# Patient Record
Sex: Female | Born: 1945 | Race: White | Hispanic: No | Marital: Married | Smoking: Never smoker
Health system: Southern US, Community
[De-identification: ages and names within clinical notes are randomized; demographics above are authoritative.]

## PROBLEM LIST (undated history)

## (undated) DIAGNOSIS — R011 Cardiac murmur, unspecified: Secondary | ICD-10-CM

## (undated) DIAGNOSIS — I4819 Other persistent atrial fibrillation: Secondary | ICD-10-CM

## (undated) DIAGNOSIS — R079 Chest pain, unspecified: Secondary | ICD-10-CM

## (undated) DIAGNOSIS — I10 Essential (primary) hypertension: Secondary | ICD-10-CM

## (undated) DIAGNOSIS — I351 Nonrheumatic aortic (valve) insufficiency: Secondary | ICD-10-CM

## (undated) DIAGNOSIS — R42 Dizziness and giddiness: Secondary | ICD-10-CM

## (undated) DIAGNOSIS — E6609 Other obesity due to excess calories: Secondary | ICD-10-CM

## (undated) DIAGNOSIS — K449 Diaphragmatic hernia without obstruction or gangrene: Secondary | ICD-10-CM

## (undated) DIAGNOSIS — I313 Pericardial effusion (noninflammatory): Secondary | ICD-10-CM

## (undated) DIAGNOSIS — H409 Unspecified glaucoma: Secondary | ICD-10-CM

## (undated) DIAGNOSIS — R002 Palpitations: Secondary | ICD-10-CM

## (undated) DIAGNOSIS — F419 Anxiety disorder, unspecified: Secondary | ICD-10-CM

## (undated) DIAGNOSIS — I480 Paroxysmal atrial fibrillation: Secondary | ICD-10-CM

## (undated) DIAGNOSIS — I052 Rheumatic mitral stenosis with insufficiency: Secondary | ICD-10-CM

## (undated) DIAGNOSIS — R0600 Dyspnea, unspecified: Secondary | ICD-10-CM

## (undated) DIAGNOSIS — G43909 Migraine, unspecified, not intractable, without status migrainosus: Secondary | ICD-10-CM

## (undated) DIAGNOSIS — Z9289 Personal history of other medical treatment: Secondary | ICD-10-CM

## (undated) DIAGNOSIS — I509 Heart failure, unspecified: Secondary | ICD-10-CM

## (undated) DIAGNOSIS — I4892 Unspecified atrial flutter: Secondary | ICD-10-CM

## (undated) DIAGNOSIS — R110 Nausea: Secondary | ICD-10-CM

## (undated) DIAGNOSIS — M1990 Unspecified osteoarthritis, unspecified site: Secondary | ICD-10-CM

## (undated) DIAGNOSIS — Z8679 Personal history of other diseases of the circulatory system: Secondary | ICD-10-CM

## (undated) DIAGNOSIS — Z9989 Dependence on other enabling machines and devices: Secondary | ICD-10-CM

## (undated) DIAGNOSIS — I330 Acute and subacute infective endocarditis: Secondary | ICD-10-CM

## (undated) DIAGNOSIS — G4733 Obstructive sleep apnea (adult) (pediatric): Secondary | ICD-10-CM

## (undated) DIAGNOSIS — R0789 Other chest pain: Secondary | ICD-10-CM

## (undated) DIAGNOSIS — K219 Gastro-esophageal reflux disease without esophagitis: Secondary | ICD-10-CM

## (undated) DIAGNOSIS — I080 Rheumatic disorders of both mitral and aortic valves: Secondary | ICD-10-CM

## (undated) HISTORY — DX: Personal history of other medical treatment: Z92.89

## (undated) HISTORY — DX: Essential (primary) hypertension: I10

## (undated) HISTORY — DX: Migraine, unspecified, not intractable, without status migrainosus: G43.909

## (undated) HISTORY — PX: BUNIONECTOMY: SHX129

## (undated) HISTORY — DX: Personal history of other diseases of the circulatory system: Z86.79

## (undated) HISTORY — DX: Palpitations: R00.2

## (undated) HISTORY — DX: Acute and subacute infective endocarditis: I33.0

## (undated) HISTORY — DX: Nausea: R11.0

## (undated) HISTORY — DX: Pericardial effusion (noninflammatory): I31.3

## (undated) HISTORY — DX: Rheumatic disorders of both mitral and aortic valves: I08.0

## (undated) HISTORY — DX: Rheumatic mitral stenosis with insufficiency: I05.2

## (undated) HISTORY — DX: Dizziness and giddiness: R42

## (undated) HISTORY — DX: Dyspnea, unspecified: R06.00

## (undated) HISTORY — PX: EYE SURGERY: SHX253

## (undated) HISTORY — DX: Cardiac murmur, unspecified: R01.1

## (undated) HISTORY — DX: Nonrheumatic aortic (valve) insufficiency: I35.1

## (undated) HISTORY — DX: Paroxysmal atrial fibrillation: I48.0

## (undated) HISTORY — PX: TONSILLECTOMY AND ADENOIDECTOMY: SUR1326

## (undated) HISTORY — DX: Unspecified atrial flutter: I48.92

## (undated) HISTORY — PX: UVULOPALATOPHARYNGOPLASTY, TONSILLECTOMY AND SEPTOPLASTY: SHX2632

## (undated) HISTORY — DX: Anxiety disorder, unspecified: F41.9

## (undated) HISTORY — DX: Other persistent atrial fibrillation: I48.19

## (undated) HISTORY — DX: Other obesity due to excess calories: E66.09

## (undated) HISTORY — DX: Other chest pain: R07.89

---

## 1973-01-25 HISTORY — PX: ABDOMINAL HYSTERECTOMY: SHX81

## 1975-01-26 HISTORY — PX: APPENDECTOMY: SHX54

## 1995-01-13 HISTORY — PX: CARDIAC CATHETERIZATION: SHX172

## 1995-01-26 HISTORY — PX: LAPAROSCOPIC CHOLECYSTECTOMY: SUR755

## 1999-08-03 ENCOUNTER — Other Ambulatory Visit: Admission: RE | Admit: 1999-08-03 | Discharge: 1999-08-03 | Payer: Self-pay | Admitting: *Deleted

## 2000-03-01 ENCOUNTER — Encounter: Payer: Self-pay | Admitting: Occupational Medicine

## 2000-03-01 ENCOUNTER — Encounter: Admission: RE | Admit: 2000-03-01 | Discharge: 2000-03-01 | Payer: Self-pay | Admitting: Occupational Medicine

## 2001-09-22 ENCOUNTER — Emergency Department (HOSPITAL_COMMUNITY): Admission: EM | Admit: 2001-09-22 | Discharge: 2001-09-23 | Payer: Self-pay | Admitting: Emergency Medicine

## 2001-09-23 ENCOUNTER — Encounter: Payer: Self-pay | Admitting: Emergency Medicine

## 2001-11-24 ENCOUNTER — Ambulatory Visit (HOSPITAL_COMMUNITY): Admission: RE | Admit: 2001-11-24 | Discharge: 2001-11-24 | Payer: Self-pay | Admitting: *Deleted

## 2005-01-25 DIAGNOSIS — Z9289 Personal history of other medical treatment: Secondary | ICD-10-CM

## 2005-01-25 HISTORY — DX: Personal history of other medical treatment: Z92.89

## 2009-05-06 ENCOUNTER — Encounter: Admission: RE | Admit: 2009-05-06 | Discharge: 2009-05-06 | Payer: Self-pay | Admitting: Cardiology

## 2009-09-11 ENCOUNTER — Ambulatory Visit: Payer: Self-pay | Admitting: Cardiology

## 2009-09-11 ENCOUNTER — Ambulatory Visit (HOSPITAL_COMMUNITY): Admission: RE | Admit: 2009-09-11 | Discharge: 2009-09-11 | Payer: Self-pay | Admitting: Cardiology

## 2010-02-03 ENCOUNTER — Ambulatory Visit: Payer: Self-pay | Admitting: Cardiology

## 2010-06-23 ENCOUNTER — Ambulatory Visit (INDEPENDENT_AMBULATORY_CARE_PROVIDER_SITE_OTHER): Payer: 59 | Admitting: Cardiology

## 2010-06-23 ENCOUNTER — Encounter: Payer: Self-pay | Admitting: *Deleted

## 2010-06-23 ENCOUNTER — Encounter: Payer: Self-pay | Admitting: Cardiology

## 2010-06-23 VITALS — BP 120/64 | HR 66 | Ht 60.0 in | Wt 153.5 lb

## 2010-06-23 DIAGNOSIS — I080 Rheumatic disorders of both mitral and aortic valves: Secondary | ICD-10-CM | POA: Insufficient documentation

## 2010-06-23 HISTORY — DX: Rheumatic disorders of both mitral and aortic valves: I08.0

## 2010-06-23 NOTE — Assessment & Plan Note (Signed)
The patient has a history of known mitral stenosis and aortic insufficiency.  She had cardiac catheterization on 01/13/95 which showed normal coronary anatomy and normal left ventricular function and mild mitral stenosis mild pulmonary hypertension and mild aortic insufficiency with a mildly dilated aortic root.  She is maintaining normal sinus rhythm.  Since last visit she has been doing well.  She's not having any unusual dyspnea.  She's been very successful in losing weight through the [employer] program.  Her final goal for weight loss target should be 145 and I gave her a note to that effect

## 2010-06-23 NOTE — Progress Notes (Signed)
Mary Farrell Date of Birth:  [birth year] [facility] / [facility] [address]   [address] [place], [place]  [address] [phone]           Fax   [phone]  HPI: This pleasant 65 year old woman is seen for a four-month followup office visit.  She has a history of mitral stenosis and aortic insufficiency.  She had cardiac catheterization in 1996 which showed normal coronary anatomy and mild mitral stenosis and mild aortic insufficiency she had chest pain in 2007 and underwent a treadmill Cardiolite stress test which showed no ischemia and her ejection fraction was 66%.  Her most recent echocardiogram was await/18/11 and showed mild mitral stenosis with a mitral valve area of 1.9 and mild to moderate aortic insufficiency and normal pulmonary artery pressure and she had a moderate pericardial effusion which was increased since 06/07/08 but no evidence of tamponade.  She had impaired relaxation with normal left ventricular systolic function.  Her last chest x-ray was 05/06/09 showing mild cardiac enlargement and no congestive heart failure.  Since last visit the patient has remained in normal sinus rhythm.  She's not having any orthopnea or paroxysmal nocturnal dyspnea.  She's not having any peripheral edema.  Her weight is down further toward her eventual goal of 145 pounds.  Current Outpatient Prescriptions  Medication Sig Dispense Refill  . ALPRAZolam (XANAX) 0.25 MG tablet Take by mouth. 3/4 of a tablet daily       . aspirin 81 MG tablet Take 81 mg by mouth daily.        . B Complex Vitamins (VITAMIN B COMPLEX PO) Take 1 capsule by mouth daily.        . Calcium Carbonate (CALCIUM 500 PO) Take 500 mg by mouth daily.        . Cholecalciferol (VITAMIN D) 2000 UNITS CAPS Take 2,000 Units by mouth daily.        . fexofenadine (ALLEGRA) 180 MG tablet Take 180 mg by mouth daily.        . hydrochlorothiazide 25 MG tablet Take 25 mg by mouth daily.        Marland Kitchen ibuprofen (ADVIL,MOTRIN) 200  MG tablet Take 600 mg by mouth as needed.        Marland Kitchen losartan (COZAAR) 100 MG tablet Take 100 mg by mouth daily.        . multivitamin (THERAGRAN) per tablet Take 1 tablet by mouth daily.        . nadolol (CORGARD) 20 MG tablet Take 20 mg by mouth daily.        Marland Kitchen omeprazole (PRILOSEC) 40 MG capsule Take 40 mg by mouth 2 (two) times daily.          No Known Allergies  Patient Active Problem List  Diagnoses  . Mitral stenosis and aortic insufficiency    History  Smoking status  . Never Smoker   Smokeless tobacco  . Never Used    History  Alcohol Use No    No family history on file.  Review of Systems: The patient denies any heat or cold intolerance.  No weight gain or weight loss.  The patient denies headaches or blurry vision.  There is no cough or sputum production.  The patient denies dizziness.  There is no hematuria or hematochezia.  The patient denies any muscle aches or arthritis.  The patient denies any rash.  The patient denies frequent falling or instability.  There is no history of depression or  anxiety.  All other systems were reviewed and are negative.   Physical Exam: Filed Vitals:   06/23/10 1419  BP: 120/64  Pulse: 66  General appearance reveals a well-developed well-nourished woman in no distress.Pupils equal and reactive.   Extraocular Movements are full.  There is no scleral icterus.  The mouth and pharynx are normal.  The neck is supple.  The carotids reveal no bruits.  The jugular venous pressure is normal.  The thyroid is not enlarged.  There is no lymphadenopathy.The chest is clear to percussion and auscultation. There are no rales or rhonchi. Expansion of the chest is symmetrical.  The heart reveals a loud first heart sound and a loud opening snap followed by a soft mitral Rumble.The abdomen is soft and nontender. Bowel sounds are normal. The liver and spleen are not enlarged. There Are no abdominal masses. There are no bruits.The pedal pulses are good.  There  is no phlebitis or edema.  There is no cyanosis or clubbing.Strength is normal and symmetrical in all extremities.  There is no lateralizing weakness.  There are no sensory deficits.    Assessment / Plan: Continue same meds and return in 4 months for followup office visit and EKG.

## 2010-08-12 ENCOUNTER — Other Ambulatory Visit: Payer: Self-pay | Admitting: Cardiology

## 2010-08-12 DIAGNOSIS — I119 Hypertensive heart disease without heart failure: Secondary | ICD-10-CM

## 2010-08-12 NOTE — Telephone Encounter (Signed)
escribe request  

## 2010-10-02 ENCOUNTER — Other Ambulatory Visit: Payer: Self-pay | Admitting: *Deleted

## 2010-10-02 DIAGNOSIS — F419 Anxiety disorder, unspecified: Secondary | ICD-10-CM

## 2010-10-02 NOTE — Telephone Encounter (Signed)
Refilled meds per fax request.  

## 2010-10-03 MED ORDER — ALPRAZOLAM 0.25 MG PO TABS: ORAL_TABLET | ORAL | Status: DC

## 2010-10-13 ENCOUNTER — Ambulatory Visit: Payer: 59 | Admitting: Cardiology

## 2010-10-17 ENCOUNTER — Encounter: Payer: Self-pay | Admitting: Cardiology

## 2010-10-30 ENCOUNTER — Ambulatory Visit (INDEPENDENT_AMBULATORY_CARE_PROVIDER_SITE_OTHER): Payer: 59 | Admitting: Cardiology

## 2010-10-30 ENCOUNTER — Encounter: Payer: Self-pay | Admitting: Cardiology

## 2010-10-30 VITALS — BP 130/80 | HR 66 | Ht 60.0 in | Wt 150.0 lb

## 2010-10-30 DIAGNOSIS — I050 Rheumatic mitral stenosis: Secondary | ICD-10-CM

## 2010-10-30 DIAGNOSIS — I080 Rheumatic disorders of both mitral and aortic valves: Secondary | ICD-10-CM

## 2010-10-30 DIAGNOSIS — R0600 Dyspnea, unspecified: Secondary | ICD-10-CM

## 2010-10-30 DIAGNOSIS — I119 Hypertensive heart disease without heart failure: Secondary | ICD-10-CM

## 2010-10-30 HISTORY — DX: Dyspnea, unspecified: R06.00

## 2010-10-30 MED ORDER — HYDROCHLOROTHIAZIDE 25 MG PO TABS: 25.0000 mg | ORAL_TABLET | Freq: Every day | ORAL | Status: DC

## 2010-10-30 NOTE — Assessment & Plan Note (Signed)
Since last visit, the patient has had no new cardiac symptoms.  She still has some exertional dyspnea.  She does not get a lot of regular aerobic exercise.  She has a history of mild exogenous obesity and has been able to lose weight on a Weight Watchers diet.  Her revised.  Weight goal is 148 pounds.  She has not been experiencing any orthopnea or paroxysmal nocturnal dyspnea.  She does not have any pedal edema.  She remains in normal sinus rhythm.

## 2010-10-30 NOTE — Assessment & Plan Note (Signed)
She has a mild chronic exertional dyspnea.  This should improve since a lot of it is related to deconditioning.  She is not having symptoms of congestive heart failure.  Does have known diastolic dysfunction.  Her blood pressure is normal

## 2010-10-30 NOTE — Progress Notes (Signed)
Mary Farrell Date of Birth:  Jul 29, 1945 [facility] / [facility] [address]   [address] [place], [place]  [address] [phone]           Fax   [phone]  HPI: This pleasant 65 year old woman is seen for a scheduled four-month followup office visit.  She has a history of mild mitral stenosis and mild aortic insufficiency.  She has remained in normal sinus rhythm.  She had cardiac catheterization in 1996, which showed normal coronary arteries, and mild mitral stenosis, and mild aortic insufficiency.  In 2007.  She had chest pain and had a normal Cardiolite stress test showing no ischemia.  Her last echocardiogram in August 2011.  Mild mitral stenosis with a mitral valve area of 1.9, and mild to moderate aortic insufficiency, and normal pulmonary pressure.  Also, had a moderate pericardial effusion.  She has normal systolic function with impaired relaxation.  Her last chest x-ray on 05/06/09 showed mild cardiac enlargement, but no congestive heart failure.  Current Outpatient Prescriptions  Medication Sig Dispense Refill  . ALPRAZolam (XANAX) 0.25 MG tablet 1/2 at hs       . aspirin 81 MG tablet Take 81 mg by mouth daily.        . B Complex Vitamins (VITAMIN B COMPLEX PO) Take 1 capsule by mouth daily.        . Calcium Carbonate (CALCIUM 500 PO) Take 500 mg by mouth daily.        . Cholecalciferol (VITAMIN D) 2000 UNITS CAPS Take 2,000 Units by mouth daily.        . fexofenadine (ALLEGRA) 180 MG tablet Take 180 mg by mouth daily.        . hydrochlorothiazide (HYDRODIURIL) 25 MG tablet Take 1 tablet (25 mg total) by mouth daily.  90 tablet  3  . ibuprofen (ADVIL,MOTRIN) 200 MG tablet Take 600 mg by mouth as needed.        Marland Kitchen losartan (COZAAR) 100 MG tablet TAKE 1 TABLET DAILY  90 tablet  3  . multivitamin (THERAGRAN) per tablet Take 1 tablet by mouth daily.        . nadolol (CORGARD) 20 MG tablet Take 20 mg by mouth daily.        Marland Kitchen omeprazole (PRILOSEC) 40 MG capsule Take 40  mg by mouth daily.         No Known Allergies  Patient Active Problem List  Diagnoses  . Mitral stenosis and aortic insufficiency    History  Smoking status  . Never Smoker   Smokeless tobacco  . Never Used    History  Alcohol Use No    No family history on file.  Review of Systems: The patient denies any heat or cold intolerance.  No weight gain or weight loss.  The patient denies headaches or blurry vision.  There is no cough or sputum production.  The patient denies dizziness.  There is no hematuria or hematochezia.  The patient denies any muscle aches or arthritis.  The patient denies any rash.  The patient denies frequent falling or instability.  There is no history of depression or anxiety.  All other systems were reviewed and are negative.   Physical Exam: Filed Vitals:   10/30/10 1524  BP: 130/80  Pulse: 66   general appearance reveals a well-developed, well-nourished, Caucasian female, in no distress.Pupils equal and reactive.   Extraocular Movements are full.  There is no scleral icterus.  The mouth and pharynx  are normal.  The neck is supple.  The carotids reveal no bruits.  The jugular venous pressure is normal.  The thyroid is not enlarged.  There is no lymphadenopathy.  The chest is clear to percussion and auscultation. There are no rales or rhonchi. Expansion of the chest is symmetrical.  The heart reveals a loud first heart sound, followed by a normal second heart sound, followed by opening click followed by a low diastolic rumble at the apex.  It is also a soft murmur of aortic insufficiency.  At the left sternal edge.The abdomen is soft and nontender. Bowel sounds are normal. The liver and spleen are not enlarged. There Are no abdominal masses. There are no bruits.  The pedal pulses are good.  There is no phlebitis or edema.  There is no cyanosis or clubbing. Strength is normal and symmetrical in all extremities.  There is no lateralizing weakness.  There are  no sensory deficits.  The skin is warm and dry.  There is no rash.  The EKG shows normal sinus rhythm with slight increase in anterior wall T-wave abnormality.  Since January 2012.     Assessment / Plan:  Continue same medication.  Recheck in 4 months.  Continue on Weight Watchers diet.  We'll recheck her EKG next visit.  After that consider updating her echo

## 2010-10-30 NOTE — Patient Instructions (Signed)
continue same dose of medications   Continue careful diet

## 2010-11-13 ENCOUNTER — Other Ambulatory Visit: Payer: Self-pay | Admitting: Cardiology

## 2011-01-26 HISTORY — PX: CATARACT EXTRACTION W/ INTRAOCULAR LENS  IMPLANT, BILATERAL: SHX1307

## 2011-01-26 HISTORY — PX: COLONOSCOPY: SHX174

## 2011-03-11 ENCOUNTER — Encounter: Payer: Self-pay | Admitting: Cardiology

## 2011-03-11 ENCOUNTER — Ambulatory Visit (INDEPENDENT_AMBULATORY_CARE_PROVIDER_SITE_OTHER): Payer: 59 | Admitting: Cardiology

## 2011-03-11 VITALS — BP 110/58 | HR 70 | Ht 60.0 in | Wt 152.0 lb

## 2011-03-11 DIAGNOSIS — R0600 Dyspnea, unspecified: Secondary | ICD-10-CM

## 2011-03-11 DIAGNOSIS — R0609 Other forms of dyspnea: Secondary | ICD-10-CM

## 2011-03-11 DIAGNOSIS — I080 Rheumatic disorders of both mitral and aortic valves: Secondary | ICD-10-CM

## 2011-03-11 DIAGNOSIS — I259 Chronic ischemic heart disease, unspecified: Secondary | ICD-10-CM

## 2011-03-11 DIAGNOSIS — K219 Gastro-esophageal reflux disease without esophagitis: Secondary | ICD-10-CM

## 2011-03-11 DIAGNOSIS — I119 Hypertensive heart disease without heart failure: Secondary | ICD-10-CM

## 2011-03-11 NOTE — Progress Notes (Signed)
Mary Farrell Date of Birth:  Oct 30, 1945 [facility] [address] [place], [place]  [address] [phone]  Fax   [phone]  HPI: This pleasant 66 year old woman is seen for a scheduled four-month followup office visit.  She has a past history of known valvular heart disease with mitral stenosis and aortic insufficiency.  She does not have coronary disease.  She had cardiac catheterization in 1996 she had a normal Cardiolite stress test in 2007.  She remains in normal sinus rhythm.  Her last echocardiogram in August 2011 showed mild mitral stenosis with a mitral valve area of 1.9 and mild to moderate aortic insufficiency and normal pulmonary artery pressure.  Her last chest x-ray on 05/06/09 showed mild cardiomegaly but no congestive heart failure.  Current Outpatient Prescriptions  Medication Sig Dispense Refill  . ALPRAZolam (XANAX) 0.25 MG tablet 3/4 at hs      . aspirin 81 MG tablet Take 81 mg by mouth daily.        . B Complex Vitamins (VITAMIN B COMPLEX PO) Take 1 capsule by mouth daily.        . Calcium Carbonate (CALCIUM 500 PO) Take 500 mg by mouth daily.        . Cholecalciferol (VITAMIN D) 2000 UNITS CAPS Take 2,000 Units by mouth daily.        . fexofenadine (ALLEGRA) 180 MG tablet Take 180 mg by mouth daily.        . hydrochlorothiazide (HYDRODIURIL) 25 MG tablet Take 1 tablet (25 mg total) by mouth daily.  90 tablet  3  . ibuprofen (ADVIL,MOTRIN) 200 MG tablet Take 600 mg by mouth as needed.        Marland Kitchen losartan (COZAAR) 100 MG tablet TAKE 1 TABLET DAILY  90 tablet  3  . multivitamin (THERAGRAN) per tablet Take 1 tablet by mouth daily.        . nadolol (CORGARD) 20 MG tablet TAKE 1 TABLET DAILY  90 tablet  2  . omeprazole (PRILOSEC) 40 MG capsule Take 40 mg by mouth daily.       . psyllium (METAMUCIL) 58.6 % powder Take 1 packet by mouth daily as needed.        No Known Allergies  Patient Active Problem List  Diagnoses  . Mitral stenosis and aortic  insufficiency  . Dyspnea    History  Smoking status  . Never Smoker   Smokeless tobacco  . Never Used    History  Alcohol Use No    No family history on file.  Review of Systems: The patient denies any heat or cold intolerance.  No weight gain or weight loss.  The patient denies headaches or blurry vision.  There is no cough or sputum production.  The patient denies dizziness.  There is no hematuria or hematochezia.  The patient denies any muscle aches or arthritis.  The patient denies any rash.  The patient denies frequent falling or instability.  There is no history of depression or anxiety.  All other systems were reviewed and are negative.   Physical Exam: Filed Vitals:   03/11/11 1132  BP: 110/58  Pulse: 70   the general appearance reveals a well-developed well-nourished woman in no distress.Pupils equal and reactive.   Extraocular Movements are full.  There is no scleral icterus.  The mouth and pharynx are normal.  The neck is supple.  The carotids reveal no bruits.  The jugular venous pressure is normal.  The thyroid is  not enlarged.  There is no lymphadenopathy.  The chest is clear to percussion and auscultation. There are no rales or rhonchi. Expansion of the chest is symmetrical.  The heart reveals a prominent opening snap and a soft diastolic apical rumble.The abdomen is soft and nontender. Bowel sounds are normal. The liver and spleen are not enlarged. There Are no abdominal masses. There are no bruits.  The pedal pulses are good.  There is no phlebitis or edema.  There is no cyanosis or clubbing. Strength is normal and symmetrical in all extremities.  There is no lateralizing weakness.  There are no sensory deficits.  The skin is warm and dry.  There is no rash.  EKG today shows normal sinus rhythm and is within normal limits and is unchanged from prior tracings.    Assessment / Plan:   continue same medication.  Work harder on weight loss.  Recheck in 4 months  for followup office visit

## 2011-03-11 NOTE — Assessment & Plan Note (Signed)
The patient has a history of GERD.  Recently she's been having more heartburn symptoms.  She has been supplementing her daily omeprazole with TUMS.  She uses an occasional Zantac as well.  She's had some recent constipation and she'll start on Metamucil or Citrucel.

## 2011-03-11 NOTE — Assessment & Plan Note (Signed)
The patient is having mild exertional dyspnea which is unchanged.  She's not having any dyspnea at rest.

## 2011-03-11 NOTE — Patient Instructions (Signed)
Start Metamucil once daily as needed Your physician recommends that you continue on your current medications as directed. Please refer to the Current Medication list given to you today. Your physician recommends that you schedule a follow-up appointment in: 4 month

## 2011-03-11 NOTE — Assessment & Plan Note (Signed)
The patient has not been having new symptoms from her valvular heart disease.  She has not been having any paroxysmal nocturnal dyspnea.  She has not been having any peripheral edema.  She has remained in normal sinus rhythm.

## 2011-03-14 ENCOUNTER — Ambulatory Visit: Payer: 59

## 2011-03-14 ENCOUNTER — Ambulatory Visit (INDEPENDENT_AMBULATORY_CARE_PROVIDER_SITE_OTHER): Payer: 59 | Admitting: Internal Medicine

## 2011-03-14 ENCOUNTER — Encounter: Payer: Self-pay | Admitting: Internal Medicine

## 2011-03-14 DIAGNOSIS — M4120 Other idiopathic scoliosis, site unspecified: Secondary | ICD-10-CM

## 2011-03-14 DIAGNOSIS — S335XXA Sprain of ligaments of lumbar spine, initial encounter: Secondary | ICD-10-CM

## 2011-03-14 DIAGNOSIS — M545 Low back pain: Secondary | ICD-10-CM

## 2011-03-14 MED ORDER — HYDROCODONE-ACETAMINOPHEN 5-500 MG PO TABS: 1.0000 | ORAL_TABLET | Freq: Three times a day (TID) | ORAL | Status: AC | PRN

## 2011-03-14 MED ORDER — METHYLPREDNISOLONE ACETATE 80 MG/ML IJ SUSP
80.0000 mg | Freq: Once | INTRAMUSCULAR | Status: AC
  Administered 2011-03-14: 80 mg via INTRAMUSCULAR

## 2011-03-14 NOTE — Progress Notes (Signed)
  Subjective:    Patient ID: Mary Farrell, female    DOB: 01/01/1946, 66 y.o.   MRN: [id number]  HPI  AT HOME AND TWISTED, FELT IMMEDIATE right low back pain. No radiation, weakness, numbness. No hx of back surgery No incontinence  Review of Systems See problem list    Objective:   Physical Exam  Constitutional: She is oriented to person, place, and time. She appears well-developed and well-nourished. She appears distressed.  Musculoskeletal:       Left hip: She exhibits decreased range of motion and tenderness. She exhibits normal strength, no swelling and no deformity.       Any movement cause right LBP and spasm  Neurological: She is alert and oriented to person, place, and time. She has normal strength and normal reflexes. No sensory deficit. Coordination abnormal.  Skin: Skin is warm, dry and intact.      [facility] reading (PRIMARY) by  Dr. 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

## 2011-03-14 NOTE — Progress Notes (Signed)
Addended by: Jonita Albee on: 03/14/2011 11:49 AM   Modules accepted: Orders

## 2011-03-25 ENCOUNTER — Other Ambulatory Visit: Payer: Self-pay | Admitting: *Deleted

## 2011-03-25 DIAGNOSIS — F419 Anxiety disorder, unspecified: Secondary | ICD-10-CM

## 2011-03-25 NOTE — Telephone Encounter (Signed)
Refill on alprazolam 

## 2011-03-28 MED ORDER — ALPRAZOLAM 0.25 MG PO TABS: 0.2500 mg | ORAL_TABLET | Freq: Every evening | ORAL | Status: DC | PRN

## 2011-07-01 ENCOUNTER — Encounter: Payer: Self-pay | Admitting: Cardiology

## 2011-07-01 ENCOUNTER — Ambulatory Visit (INDEPENDENT_AMBULATORY_CARE_PROVIDER_SITE_OTHER): Payer: 59 | Admitting: Cardiology

## 2011-07-01 VITALS — BP 128/80 | HR 66 | Ht 61.0 in | Wt 154.0 lb

## 2011-07-01 DIAGNOSIS — K219 Gastro-esophageal reflux disease without esophagitis: Secondary | ICD-10-CM

## 2011-07-01 DIAGNOSIS — I080 Rheumatic disorders of both mitral and aortic valves: Secondary | ICD-10-CM

## 2011-07-01 NOTE — Assessment & Plan Note (Signed)
She has not been having any palpitations.  She has not been having any chest pain.  He notes occasional exertional dyspnea.  She is relatively sedentary but has used her home treadmill occasionally

## 2011-07-01 NOTE — Patient Instructions (Signed)
Your physician recommends that you continue on your current medications as directed. Please refer to the Current Medication list given to you today. Your physician wants you to follow-up in: 4 months You will receive a reminder letter in the mail two months in advance. If you don't receive a letter, please call our office to schedule the follow-up appointment.  

## 2011-07-01 NOTE — Assessment & Plan Note (Signed)
The patient has not been having any symptoms from her GERD recently.  She has gained 2 pounds since last visit and is now going back to Weight Watchers.

## 2011-07-01 NOTE — Progress Notes (Signed)
Mary Farrell Date of Birth:  Feb 28, 1945 [facility] [address] [place], [place]  [address] [phone]  Fax   [phone]  HPI: This pleasant 66 year old woman is seen for a scheduled four-month followup office visit.  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  She does not have any history of ischemic heart disease.  She had a cardiac catheterization in 1996 and she had a normal Cardiolite stress test in 2007 her last echocardiogram in August 2011 showed mild mitral stenosis and mild to moderate aortic insufficiency and normal pulmonary artery pressure.  She is maintaining normal sinus rhythm.  She has cardiomegaly by chest x-ray but no symptoms of CHF.  Current Outpatient Prescriptions  Medication Sig Dispense Refill  . ALPRAZolam (XANAX) 0.25 MG tablet Take 1 tablet (0.25 mg total) by mouth at bedtime as needed for sleep. 3/4 at hs  30 tablet  5  . aspirin 81 MG tablet Take 81 mg by mouth daily.        . B Complex Vitamins (VITAMIN B COMPLEX PO) Take 1 capsule by mouth daily.        . Calcium Carbonate (CALCIUM 500 PO) Take 500 mg by mouth daily.        . Cholecalciferol (VITAMIN D) 2000 UNITS CAPS Take 2,000 Units by mouth daily.        . fexofenadine (ALLEGRA) 180 MG tablet Take 180 mg by mouth daily.        . hydrochlorothiazide (HYDRODIURIL) 25 MG tablet Take 1 tablet (25 mg total) by mouth daily.  90 tablet  3  . ibuprofen (ADVIL,MOTRIN) 200 MG tablet Take 600 mg by mouth as needed.        Marland Kitchen losartan (COZAAR) 100 MG tablet TAKE 1 TABLET DAILY  90 tablet  3  . multivitamin (THERAGRAN) per tablet Take 1 tablet by mouth daily.        . nadolol (CORGARD) 20 MG tablet TAKE 1 TABLET DAILY  90 tablet  2  . omeprazole (PRILOSEC) 40 MG capsule Take 40 mg by mouth daily.       . psyllium (METAMUCIL) 58.6 % powder Take 1 packet by mouth daily as needed.      Marland Kitchen CLIMARA 0.075 MG/24HR as directed.        No Known  Allergies  Patient Active Problem List  Diagnoses  . Mitral stenosis and aortic insufficiency  . Dyspnea  . GERD (gastroesophageal reflux disease)    History  Smoking status  . Never Smoker   Smokeless tobacco  . Never Used    History  Alcohol Use No    No family history on file.  Review of Systems: The patient denies any heat or cold intolerance.  No weight gain or weight loss.  The patient denies headaches or blurry vision.  There is no cough or sputum production.  The patient denies dizziness.  There is no hematuria or hematochezia.  The patient denies any muscle aches or arthritis.  The patient denies any rash.  The patient denies frequent falling or instability.  There is no history of depression or anxiety.  All other systems were reviewed and are negative.   Physical Exam: Filed Vitals:   07/01/11 1140  BP: 128/80  Pulse: 66   the general appearance reveals a well-developed well-nourished woman in no distress.The head and neck exam reveals pupils equal and reactive.  Extraocular movements are full.  There is no scleral  icterus.  The mouth and pharynx are normal.  The neck is supple.  The carotids reveal no bruits.  The jugular venous pressure is normal.  The  thyroid is not enlarged.  There is no lymphadenopathy.  The chest is clear to percussion and auscultation.  There are no rales or rhonchi.  Expansion of the chest is symmetrical.  The precordium is quiet.  The first heart sound is normal.  The second heart sound is physiologically split.  There is a prominent first heart sound and there is any opening snap following the second heart sound.  There is a soft apical diastolic rumble.  Rhythm is regular.  There is no abnormal lift or heave.  The abdomen is soft and nontender.  The bowel sounds are normal.  The liver and spleen are not enlarged.  There are no abdominal masses.  There are no abdominal bruits.  Extremities reveal good pedal pulses.  There is no phlebitis or edema.   There is no cyanosis or clubbing.  Strength is normal and symmetrical in all extremities.  There is no lateralizing weakness.  There are no sensory deficits.  The skin is warm and dry.  There is no rash.      Assessment / Plan: Continue same medication.  Recheck in 4 months.  Work harder at weight loss.

## 2011-07-10 ENCOUNTER — Other Ambulatory Visit: Payer: Self-pay | Admitting: Cardiology

## 2011-07-12 NOTE — Telephone Encounter (Signed)
Refilled losartan 

## 2011-07-18 ENCOUNTER — Other Ambulatory Visit: Payer: Self-pay | Admitting: Cardiology

## 2011-07-19 NOTE — Telephone Encounter (Signed)
Refilled nadolol

## 2011-09-13 ENCOUNTER — Other Ambulatory Visit: Payer: Self-pay | Admitting: *Deleted

## 2011-09-13 DIAGNOSIS — F419 Anxiety disorder, unspecified: Secondary | ICD-10-CM

## 2011-09-13 NOTE — Telephone Encounter (Signed)
Refilled alprazolam 

## 2011-09-14 MED ORDER — ALPRAZOLAM 0.25 MG PO TABS: ORAL_TABLET | ORAL | Status: DC

## 2011-10-21 ENCOUNTER — Encounter: Payer: Self-pay | Admitting: Cardiology

## 2011-11-08 ENCOUNTER — Ambulatory Visit (INDEPENDENT_AMBULATORY_CARE_PROVIDER_SITE_OTHER): Payer: 59 | Admitting: Cardiology

## 2011-11-08 ENCOUNTER — Encounter: Payer: Self-pay | Admitting: Cardiology

## 2011-11-08 VITALS — BP 126/68 | HR 67 | Ht 60.0 in | Wt 153.8 lb

## 2011-11-08 DIAGNOSIS — R0600 Dyspnea, unspecified: Secondary | ICD-10-CM

## 2011-11-08 DIAGNOSIS — K219 Gastro-esophageal reflux disease without esophagitis: Secondary | ICD-10-CM

## 2011-11-08 DIAGNOSIS — I080 Rheumatic disorders of both mitral and aortic valves: Secondary | ICD-10-CM

## 2011-11-08 NOTE — Progress Notes (Signed)
Mary Farrell Date of Birth:  1945-10-08 [facility] [address] [place], [place]  [address] [phone]         Fax   [phone]  History of Present Illness: This pleasant 66 year old woman is seen for a scheduled four-month followup office visit. 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 She does not have any history of ischemic heart disease. She had a cardiac catheterization in 1996 and she had a normal Cardiolite stress test in 2007 her last echocardiogram in August 2011 showed mild mitral stenosis and mild to moderate aortic insufficiency and normal pulmonary artery pressure. She is maintaining normal sinus rhythm. She has cardiomegaly by chest x-ray but no symptoms of CHF Since last visit she's had no new cardiac symptoms.  She now has glaucoma and is on timolol eyedrops which has not had any adverse effect on her cardiac function.  She also has had successful bilateral cataract surgery by Dr. Dione Booze since we last saw her.  Current Outpatient Prescriptions  Medication Sig Dispense Refill  . ALPRAZolam (XANAX) 0.25 MG tablet Take one at bedtime  30 tablet  5  . aspirin 81 MG tablet Take 81 mg by mouth daily.        . B Complex Vitamins (VITAMIN B COMPLEX PO) Take 1 capsule by mouth daily.        . Calcium Carbonate (CALCIUM 500 PO) Take 500 mg by mouth daily.        . Cholecalciferol (VITAMIN D) 2000 UNITS CAPS Take 2,000 Units by mouth daily.        Marland Kitchen CLIMARA 0.075 MG/24HR as directed.      . dorzolamide-timolol (COSOPT) 22.3-6.8 MG/ML ophthalmic solution 1 drop.       . fexofenadine (ALLEGRA) 180 MG tablet Take 180 mg by mouth daily.        . hydrochlorothiazide (HYDRODIURIL) 25 MG tablet Take 1 tablet (25 mg total) by mouth daily.  90 tablet  3  . ibuprofen (ADVIL,MOTRIN) 200 MG tablet Take 600 mg by mouth as needed.        Marland Kitchen losartan (COZAAR) 100 MG tablet TAKE 1 TABLET DAILY  90 tablet  3  . multivitamin  (THERAGRAN) per tablet Take 1 tablet by mouth daily.        . nadolol (CORGARD) 20 MG tablet TAKE 1 TABLET DAILY  90 tablet  3  . omeprazole (PRILOSEC) 40 MG capsule Take 40 mg by mouth daily.       . psyllium (METAMUCIL) 58.6 % powder Take 1 packet by mouth daily as needed.        No Known Allergies  Patient Active Problem List  Diagnosis  . Mitral stenosis and aortic insufficiency  . Dyspnea  . GERD (gastroesophageal reflux disease)    History  Smoking status  . Never Smoker   Smokeless tobacco  . Never Used    History  Alcohol Use No    History reviewed. No pertinent family history.  Review of Systems: Constitutional: no fever chills diaphoresis or fatigue or change in weight.  Head and neck: no hearing loss, no epistaxis, no photophobia or visual disturbance. Respiratory: No cough, shortness of breath or wheezing. Cardiovascular: No chest pain peripheral edema, palpitations. Gastrointestinal: No abdominal distention, no abdominal pain, no change in bowel habits hematochezia or melena. Genitourinary: No dysuria, no frequency, no urgency, no nocturia. Musculoskeletal:No arthralgias, no back pain, no gait disturbance or myalgias. Neurological: No dizziness,  no headaches, no numbness, no seizures, no syncope, no weakness, no tremors. Hematologic: No lymphadenopathy, no easy bruising. Psychiatric: No confusion, no hallucinations, no sleep disturbance.    Physical Exam: Filed Vitals:   11/08/11 1532  BP: 126/68  Pulse: 67   the general appearance reveals a well-developed well-nourished woman in no distress.The head and neck exam reveals pupils equal and reactive.  Extraocular movements are full.  There is no scleral icterus.  The mouth and pharynx are normal.  The neck is supple.  The carotids reveal no bruits.  The jugular venous pressure is normal.  The  thyroid is not enlarged.  There is no lymphadenopathy.  The chest is clear to percussion and auscultation.  There are  no rales or rhonchi.  Expansion of the chest is symmetrical.  The precordium is quiet.  The first heart sound is normal.  The second heart sound is physiologically split.  There is a loud first heart sound and there is a prominent opening snap followed by an early diastolic murmur of mitral stenosis.  There is no abnormal lift or heave.  The abdomen is soft and nontender.  The bowel sounds are normal.  The liver and spleen are not enlarged.  There are no abdominal masses.  There are no abdominal bruits.  Extremities reveal good pedal pulses.  There is no phlebitis or edema.  There is no cyanosis or clubbing.  Strength is normal and symmetrical in all extremities.  There is no lateralizing weakness.  There are no sensory deficits.  The skin is warm and dry.  There is no rash.     Assessment / Plan: Continue same medication but I want her to lose weight and to join an exercise program.  She needs to be more physically active.  Recheck in 4 months for followup office visit and EKG.

## 2011-11-08 NOTE — Assessment & Plan Note (Signed)
The patient has had no change in her exertional dyspnea.  She is sedentary and does not get any regular exercise.

## 2011-11-08 NOTE — Patient Instructions (Addendum)
Work harder on weight loss and increase your exercise  Your physician recommends that you continue on your current medications as directed. Please refer to the Current Medication list given to you today.  Your physician recommends that you schedule a follow-up appointment in: 4 month ov/ekg

## 2011-11-08 NOTE — Assessment & Plan Note (Signed)
The patient remains in normal sinus rhythm.  No TIA or stroke symptoms.  No angina or symptoms of CHF

## 2011-11-08 NOTE — Assessment & Plan Note (Signed)
Her symptoms of GERD remain mild and are responding to omeprazole

## 2012-02-21 ENCOUNTER — Other Ambulatory Visit: Payer: Self-pay

## 2012-02-21 DIAGNOSIS — I119 Hypertensive heart disease without heart failure: Secondary | ICD-10-CM

## 2012-02-21 MED ORDER — HYDROCHLOROTHIAZIDE 25 MG PO TABS: 25.0000 mg | ORAL_TABLET | Freq: Every day | ORAL | Status: DC

## 2012-02-24 ENCOUNTER — Other Ambulatory Visit: Payer: Self-pay

## 2012-02-24 DIAGNOSIS — I119 Hypertensive heart disease without heart failure: Secondary | ICD-10-CM

## 2012-02-24 MED ORDER — HYDROCHLOROTHIAZIDE 25 MG PO TABS: 25.0000 mg | ORAL_TABLET | Freq: Every day | ORAL | Status: DC

## 2012-03-14 ENCOUNTER — Other Ambulatory Visit: Payer: Self-pay | Admitting: *Deleted

## 2012-03-14 DIAGNOSIS — F419 Anxiety disorder, unspecified: Secondary | ICD-10-CM

## 2012-03-14 MED ORDER — ALPRAZOLAM 0.25 MG PO TABS: ORAL_TABLET | ORAL | Status: DC

## 2012-03-14 NOTE — Telephone Encounter (Signed)
Called in Rx

## 2012-03-14 NOTE — Telephone Encounter (Signed)
Pharmacy is requesting Xanax . Will route this to his nurse

## 2012-03-15 ENCOUNTER — Ambulatory Visit: Payer: 59 | Admitting: Cardiology

## 2012-03-23 ENCOUNTER — Encounter: Payer: Self-pay | Admitting: Cardiology

## 2012-03-23 ENCOUNTER — Ambulatory Visit (INDEPENDENT_AMBULATORY_CARE_PROVIDER_SITE_OTHER): Payer: 59 | Admitting: Cardiology

## 2012-03-23 VITALS — BP 118/72 | HR 56 | Ht 60.0 in | Wt 161.0 lb

## 2012-03-23 DIAGNOSIS — R0600 Dyspnea, unspecified: Secondary | ICD-10-CM

## 2012-03-23 MED ORDER — HYDROCHLOROTHIAZIDE 25 MG PO TABS: 25.0000 mg | ORAL_TABLET | Freq: Every day | ORAL | Status: DC

## 2012-03-23 NOTE — Assessment & Plan Note (Signed)
The patient has dyspnea if she walks far or if she tries to climb stairs.  She has gained weight since last visit.  She is not getting any regular exercise.  She does have a treadmill available for home use but has not been using it.

## 2012-03-23 NOTE — Assessment & Plan Note (Signed)
Since last visit she's had no new cardiac symptoms.  She denies any orthopnea or paroxysmal nocturnal dyspnea.  She has not had any recurrence of atrial fibrillation.

## 2012-03-23 NOTE — Progress Notes (Signed)
Mary Farrell Date of Birth:  [birth year] [facility] [address] [place], [place]  [address] [phone]  Fax   [phone]  HPI: This pleasant 67 year old woman is seen for a scheduled four-month followup office visit. 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 She does not have any history of ischemic heart disease. She had a cardiac catheterization in 1996 and she had a normal Cardiolite stress test in 2007.  Her last echocardiogram in August 2011 showed mild mitral stenosis and mild to moderate aortic insufficiency and normal pulmonary artery pressure. She is maintaining normal sinus rhythm. She has cardiomegaly by chest x-ray but no symptoms of CHF   Current Outpatient Prescriptions  Medication Sig Dispense Refill  . ALPRAZolam (XANAX) 0.25 MG tablet Take one at bedtime  30 tablet  5  . aspirin 81 MG tablet Take 81 mg by mouth daily.        . B Complex Vitamins (VITAMIN B COMPLEX PO) Take 1 capsule by mouth daily.        . Calcium Carbonate (CALCIUM 500 PO) Take 500 mg by mouth daily.        . Cholecalciferol (VITAMIN D) 2000 UNITS CAPS Take 2,000 Units by mouth daily.        Marland Kitchen CLIMARA 0.075 MG/24HR as directed.      . dorzolamide-timolol (COSOPT) 22.3-6.8 MG/ML ophthalmic solution 1 drop.       . hydrochlorothiazide (HYDRODIURIL) 25 MG tablet Take 1 tablet (25 mg total) by mouth daily.  90 tablet  3  . ibuprofen (ADVIL,MOTRIN) 200 MG tablet Take 600 mg by mouth as needed.        . loratadine (CLARITIN) 10 MG tablet Take 10 mg by mouth daily.      Marland Kitchen losartan (COZAAR) 100 MG tablet TAKE 1 TABLET DAILY  90 tablet  3  . multivitamin (THERAGRAN) per tablet Take 1 tablet by mouth daily.        . nadolol (CORGARD) 20 MG tablet TAKE 1 TABLET DAILY  90 tablet  3  . omeprazole (PRILOSEC) 40 MG capsule Take 40 mg by mouth daily.       . psyllium (METAMUCIL) 58.6 % powder Take 1 packet by mouth daily as needed.       No  current facility-administered medications for this visit.    No Known Allergies  Patient Active Problem List  Diagnosis  . Mitral stenosis and aortic insufficiency  . Dyspnea  . GERD (gastroesophageal reflux disease)    History  Smoking status  . Never Smoker   Smokeless tobacco  . Never Used    History  Alcohol Use No    No family history on file.  Review of Systems: The patient denies any heat or cold intolerance.  No weight gain or weight loss.  The patient denies headaches or blurry vision.  There is no cough or sputum production.  The patient denies dizziness.  There is no hematuria or hematochezia.  The patient denies any muscle aches or arthritis.  The patient denies any rash.  The patient denies frequent falling or instability.  There is no history of depression or anxiety.  All other systems were reviewed and are negative.   Physical Exam: Filed Vitals:   03/23/12 1414  BP: 118/72  Pulse: 56   the general appearance reveals a well-developed well-nourished woman in no distress.The head and neck exam reveals pupils equal and reactive.  Extraocular movements are  full.  There is no scleral icterus.  The mouth and pharynx are normal.  The neck is supple.  The carotids reveal no bruits.  The jugular venous pressure is normal.  The  thyroid is not enlarged.  There is no lymphadenopathy.  The chest is clear to percussion and auscultation.  There are no rales or rhonchi.  Expansion of the chest is symmetrical.  The precordium is quiet.  The first heart sound is normal.  The second heart sound is physiologically split.  There is a prominent opening snap followed by a diastolic rumble at the apex. There is no abnormal lift or heave.  The abdomen is soft and nontender.  The bowel sounds are normal.  The liver and spleen are not enlarged.  There are no abdominal masses.  There are no abdominal bruits.  Extremities reveal good pedal pulses.  There is no phlebitis or edema.  There is no  cyanosis or clubbing.  Strength is normal and symmetrical in all extremities.  There is no lateralizing weakness.  There are no sensory deficits.  The skin is warm and dry.  There is no rash.   EKG shows sinus bradycardia and is otherwise within normal limits   Assessment / Plan: The patient is to continue same medication and be rechecked in 4 months.  Needs to make a better effort at weight loss and getting herself into better condition through walking on a regular basis.

## 2012-03-23 NOTE — Patient Instructions (Addendum)
Your physician recommends that you continue on your current medications as directed. Please refer to the Current Medication list given to you today.  Your physician recommends that you schedule a follow-up appointment in: 4 months  

## 2012-04-07 ENCOUNTER — Telehealth: Payer: Self-pay | Admitting: Cardiology

## 2012-04-07 MED ORDER — ALPRAZOLAM 0.25 MG PO TABS: ORAL_TABLET | ORAL | Status: DC

## 2012-04-07 NOTE — Telephone Encounter (Signed)
Pt needs to talk with melinda re generic xanax

## 2012-04-07 NOTE — Telephone Encounter (Signed)
Patient requested Xanax be sent to [facility], will have  Dr. Patty Sermons sign and fax

## 2012-04-14 ENCOUNTER — Telehealth: Payer: Self-pay | Admitting: Cardiology

## 2012-04-14 NOTE — Telephone Encounter (Signed)
I spoke with pt and she had called [facility] about her xanax prescription. They did not have it according to her.  Talked with pharmacist at [facility].  They received the prescription last week but it wasn't a clear transmission. They did not call our office.  Refill order given for this medication. Could only do 1 refill as is a controlled substance. They refill only 6 months at a time for these. Mylo Red RN

## 2012-04-14 NOTE — Telephone Encounter (Signed)
New problem    Follow up on refill that was discuss last week with [facility].

## 2012-06-29 ENCOUNTER — Encounter: Payer: Self-pay | Admitting: Cardiology

## 2012-06-29 ENCOUNTER — Ambulatory Visit (INDEPENDENT_AMBULATORY_CARE_PROVIDER_SITE_OTHER): Payer: 59 | Admitting: Cardiology

## 2012-06-29 VITALS — BP 128/76 | HR 76 | Ht 60.0 in | Wt 156.4 lb

## 2012-06-29 DIAGNOSIS — F419 Anxiety disorder, unspecified: Secondary | ICD-10-CM

## 2012-06-29 DIAGNOSIS — I119 Hypertensive heart disease without heart failure: Secondary | ICD-10-CM

## 2012-06-29 DIAGNOSIS — I080 Rheumatic disorders of both mitral and aortic valves: Secondary | ICD-10-CM

## 2012-06-29 DIAGNOSIS — K219 Gastro-esophageal reflux disease without esophagitis: Secondary | ICD-10-CM

## 2012-06-29 DIAGNOSIS — F411 Generalized anxiety disorder: Secondary | ICD-10-CM

## 2012-06-29 HISTORY — DX: Anxiety disorder, unspecified: F41.9

## 2012-06-29 MED ORDER — LOSARTAN POTASSIUM 100 MG PO TABS: ORAL_TABLET | ORAL | Status: DC

## 2012-06-29 NOTE — Assessment & Plan Note (Signed)
She is not having any symptoms referable to her heart disease.  She remains in normal sinus rhythm

## 2012-06-29 NOTE — Progress Notes (Signed)
Mary Farrell Date of Birth:  [birth year] [facility] [address] [place], [place]  [address] [phone]  Fax   [phone]  HPI: This pleasant 67 year old woman is seen for a scheduled four-month followup office visit. 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 She does not have any history of ischemic heart disease. She had a cardiac catheterization in 1996 and she had a normal Cardiolite stress test in 2007. Her last echocardiogram in August 2011 showed mild mitral stenosis and mild to moderate aortic insufficiency and normal pulmonary artery pressure. She is maintaining normal sinus rhythm. She has cardiomegaly by chest x-ray but no symptoms of CHF.  Her last chest x-ray was several years ago and we will update her chest x-ray.   Current Outpatient Prescriptions  Medication Sig Dispense Refill  . ALPRAZolam (XANAX) 0.25 MG tablet One daily as needed  90 tablet  3  . aspirin 81 MG tablet Take 81 mg by mouth daily.        . B Complex Vitamins (VITAMIN B COMPLEX PO) Take 1 capsule by mouth daily.        . Calcium Carbonate (CALCIUM 500 PO) Take 500 mg by mouth daily.        . Cholecalciferol (VITAMIN D) 2000 UNITS CAPS Take 2,000 Units by mouth daily.        Marland Kitchen CLIMARA 0.075 MG/24HR as directed.      . dorzolamide-timolol (COSOPT) 22.3-6.8 MG/ML ophthalmic solution 1 drop.       . hydrochlorothiazide (HYDRODIURIL) 25 MG tablet Take 1 tablet (25 mg total) by mouth daily.  90 tablet  3  . ibuprofen (ADVIL,MOTRIN) 200 MG tablet Take 600 mg by mouth as needed.        . loratadine (CLARITIN) 10 MG tablet Take 10 mg by mouth daily.      Marland Kitchen losartan (COZAAR) 100 MG tablet TAKE 1 TABLET DAILY  90 tablet  3  . multivitamin (THERAGRAN) per tablet Take 1 tablet by mouth daily.        . nadolol (CORGARD) 20 MG tablet TAKE 1 TABLET DAILY  90 tablet  3  . omeprazole (PRILOSEC) 40 MG capsule Take 40 mg by mouth daily.       .  psyllium (METAMUCIL) 58.6 % powder Take 1 packet by mouth daily as needed.       No current facility-administered medications for this visit.    No Known Allergies  Patient Active Problem List   Diagnosis Date Noted  . GERD (gastroesophageal reflux disease) 03/11/2011  . Dyspnea 10/30/2010  . Mitral stenosis and aortic insufficiency 06/23/2010    History  Smoking status  . Never Smoker   Smokeless tobacco  . Never Used    History  Alcohol Use No    No family history on file.  Review of Systems: The patient denies any heat or cold intolerance.  No weight gain or weight loss.  The patient denies headaches or blurry vision.  There is no cough or sputum production.  The patient denies dizziness.  There is no hematuria or hematochezia.  The patient denies any muscle aches or arthritis.  The patient denies any rash.  The patient denies frequent falling or instability.  There is no history of depression or anxiety.  All other systems were reviewed and are negative.   Physical Exam: Filed Vitals:   06/29/12 1201  BP: 128/76  Pulse: 76   the general appearance  reveals a well-developed well-nourished woman in no distress.The head and neck exam reveals pupils equal and reactive.  Extraocular movements are full.  There is no scleral icterus.  The mouth and pharynx are normal.  The neck is supple.  The carotids reveal no bruits.  The jugular venous pressure is normal.  The  thyroid is not enlarged.  There is no lymphadenopathy.  The chest is clear to percussion and auscultation.  There are no rales or rhonchi.  Expansion of the chest is symmetrical.  The precordium is quiet.  The first heart sound is normal.  The second heart sound is physiologically split.  There is a loud first heart sound and there is a prominent opening snap.  There is a soft murmur of mitral stenosis at apex. There is no abnormal lift or heave.  The abdomen is soft and nontender.  The bowel sounds are normal.  The liver  and spleen are not enlarged.  There are no abdominal masses.  There are no abdominal bruits.  Extremities reveal good pedal pulses.  There is no phlebitis or edema.  There is no cyanosis or clubbing.  Strength is normal and symmetrical in all extremities.  There is no lateralizing weakness.  There are no sensory deficits.  The skin is warm and dry.  There is no rash.      Assessment / Plan: Continue same medication.  Update chest x-ray for heart size.  Recheck in 4 months for office visit.
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
## 2012-06-29 NOTE — Assessment & Plan Note (Signed)
The patient is back in weight watchers program and has lost 5 pounds since February.  Her GI symptoms have improved.

## 2012-06-29 NOTE — Patient Instructions (Addendum)
Your physician recommends that you continue on your current medications as directed. Please refer to the Current Medication list given to you today.  Your physician wants you to follow-up in: 4 month ov You will receive a reminder letter in the mail two months in advance. If you don't receive a letter, please call our office to schedule the follow-up appointment.  

## 2012-08-30 ENCOUNTER — Other Ambulatory Visit: Payer: Self-pay

## 2012-10-06 ENCOUNTER — Other Ambulatory Visit: Payer: Self-pay | Admitting: *Deleted

## 2012-10-06 ENCOUNTER — Encounter: Payer: Self-pay | Admitting: Cardiology

## 2012-10-06 ENCOUNTER — Telehealth: Payer: Self-pay | Admitting: Cardiology

## 2012-10-06 DIAGNOSIS — F419 Anxiety disorder, unspecified: Secondary | ICD-10-CM

## 2012-10-06 MED ORDER — ALPRAZOLAM 0.25 MG PO TABS: ORAL_TABLET | ORAL | Status: DC

## 2012-10-06 NOTE — Telephone Encounter (Signed)
Spoke with patient and she would like Xanax phoned to [facility] instead of mail order, ok per  Dr. Patty Sermons. Did call mail order and cancel RX

## 2012-10-06 NOTE — Telephone Encounter (Signed)
Pt needs a mail order pharmacy script// transferred to medications department.

## 2012-10-20 ENCOUNTER — Other Ambulatory Visit: Payer: Self-pay

## 2012-10-20 MED ORDER — NADOLOL 20 MG PO TABS: ORAL_TABLET | ORAL | Status: DC

## 2012-10-23 ENCOUNTER — Other Ambulatory Visit: Payer: Self-pay | Admitting: *Deleted

## 2012-10-23 MED ORDER — NADOLOL 20 MG PO TABS: ORAL_TABLET | ORAL | Status: DC

## 2012-10-25 ENCOUNTER — Encounter: Payer: Self-pay | Admitting: Cardiology

## 2012-10-26 ENCOUNTER — Encounter: Payer: Self-pay | Admitting: Cardiology

## 2012-10-27 ENCOUNTER — Encounter: Payer: Self-pay | Admitting: Cardiology

## 2012-11-07 ENCOUNTER — Encounter: Payer: Self-pay | Admitting: Cardiology

## 2012-11-08 ENCOUNTER — Other Ambulatory Visit: Payer: Self-pay | Admitting: *Deleted

## 2012-11-08 DIAGNOSIS — F419 Anxiety disorder, unspecified: Secondary | ICD-10-CM

## 2012-11-08 MED ORDER — NADOLOL 20 MG PO TABS: ORAL_TABLET | ORAL | Status: DC

## 2012-11-08 MED ORDER — ALPRAZOLAM 0.25 MG PO TABS: ORAL_TABLET | ORAL | Status: DC

## 2012-11-08 NOTE — Telephone Encounter (Signed)
Refilled as requested, will fax after  Dr. Patty Sermons signs

## 2012-11-10 ENCOUNTER — Ambulatory Visit: Payer: 59 | Admitting: Cardiology

## 2012-11-30 ENCOUNTER — Other Ambulatory Visit: Payer: Self-pay

## 2012-12-12 ENCOUNTER — Encounter: Payer: Self-pay | Admitting: Cardiology

## 2012-12-12 ENCOUNTER — Ambulatory Visit (INDEPENDENT_AMBULATORY_CARE_PROVIDER_SITE_OTHER): Payer: Medicare Other | Admitting: Cardiology

## 2012-12-12 VITALS — BP 134/72 | HR 69 | Ht 60.0 in | Wt 149.6 lb

## 2012-12-12 DIAGNOSIS — I119 Hypertensive heart disease without heart failure: Secondary | ICD-10-CM

## 2012-12-12 DIAGNOSIS — I080 Rheumatic disorders of both mitral and aortic valves: Secondary | ICD-10-CM

## 2012-12-12 DIAGNOSIS — F411 Generalized anxiety disorder: Secondary | ICD-10-CM

## 2012-12-12 DIAGNOSIS — F419 Anxiety disorder, unspecified: Secondary | ICD-10-CM

## 2012-12-12 NOTE — Assessment & Plan Note (Signed)
The patient is maintaining normal sinus rhythm.  She has not had any evidence of atrial fibrillation.  She is not having any symptoms of CHF

## 2012-12-12 NOTE — Assessment & Plan Note (Signed)
The patient has not been having increased problems with anxiety.  She is still making a good effort to lose weight.  She is in [employer] program and we have set her goal for 150 pounds

## 2012-12-12 NOTE — Patient Instructions (Addendum)
Need for you to go soon for a chest xray at the [facility] across from [facility]   Your physician recommends that you continue on your current medications as directed. Please refer to the Current Medication list given to you today.  Your physician recommends that you schedule a follow-up appointment in: 4 month ov/ekg

## 2012-12-12 NOTE — Progress Notes (Signed)
Mary Farrell Date of Birth:  May 15, 1945 [address] [place], [place]  [address] [phone]  Fax   [phone]  HPI: This pleasant 67 year old woman is seen for a scheduled four-month followup office visit. 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 She does not have any history of ischemic heart disease. She had a cardiac catheterization in 1996 and she had a normal Cardiolite stress test in 2007. Her last echocardiogram in August 2011 showed mild mitral stenosis and mild to moderate aortic insufficiency and normal pulmonary artery pressure. She is maintaining normal sinus rhythm. She has cardiomegaly by chest x-ray but no symptoms of CHF.  Her last chest x-ray was 2011 and we will update her chest x-ray.   Current Outpatient Prescriptions  Medication Sig Dispense Refill  . ALPRAZolam (XANAX) 0.25 MG tablet One daily as needed  90 tablet  1  . aspirin 81 MG tablet Take 81 mg by mouth daily.        . B Complex Vitamins (VITAMIN B COMPLEX PO) Take 1 capsule by mouth daily.        . Calcium Carbonate (CALCIUM 500 PO) Take 500 mg by mouth daily.        . Cholecalciferol (VITAMIN D) 2000 UNITS CAPS Take 2,000 Units by mouth daily.        Marland Kitchen CLIMARA 0.075 MG/24HR as directed.      . hydrochlorothiazide (HYDRODIURIL) 25 MG tablet Take 1 tablet (25 mg total) by mouth daily.  90 tablet  3  . ibuprofen (ADVIL,MOTRIN) 200 MG tablet Take 600 mg by mouth as needed.        . loratadine (CLARITIN) 10 MG tablet Take 10 mg by mouth daily.      Marland Kitchen losartan (COZAAR) 100 MG tablet TAKE 1 TABLET DAILY  90 tablet  3  . multivitamin (THERAGRAN) per tablet Take 1 tablet by mouth daily.        . nadolol (CORGARD) 20 MG tablet TAKE 1 TABLET DAILY  90 tablet  3  . omeprazole (PRILOSEC) 40 MG capsule Take 40 mg by mouth daily.       . psyllium (METAMUCIL) 58.6 % powder Take 1 packet by mouth daily as needed.       No current facility-administered  medications for this visit.    No Known Allergies  Patient Active Problem List   Diagnosis Date Noted  . Anxiety 06/29/2012  . GERD (gastroesophageal reflux disease) 03/11/2011  . Dyspnea 10/30/2010  . Mitral stenosis and aortic insufficiency 06/23/2010    History  Smoking status  . Never Smoker   Smokeless tobacco  . Never Used    History  Alcohol Use No    Family History  Problem Relation Age of Onset  . Heart attack Mother     Review of Systems: The patient denies any heat or cold intolerance.  No weight gain or weight loss.  The patient denies headaches or blurry vision.  There is no cough or sputum production.  The patient denies dizziness.  There is no hematuria or hematochezia.  The patient denies any muscle aches or arthritis.  The patient denies any rash.  The patient denies frequent falling or instability.  There is no history of depression or anxiety.  All other systems were reviewed and are negative.   Physical Exam: Filed Vitals:   12/12/12 1610  BP: 134/72  Pulse: 69   the general appearance reveals a well-developed  well-nourished woman in no distress.The head and neck exam reveals pupils equal and reactive.  Extraocular movements are full.  There is no scleral icterus.  The mouth and pharynx are normal.  The neck is supple.  The carotids reveal no bruits.  The jugular venous pressure is normal.  The  thyroid is not enlarged.  There is no lymphadenopathy.  The chest is clear to percussion and auscultation.  There are no rales or rhonchi.  Expansion of the chest is symmetrical.  The precordium is quiet.  The first heart sound is normal.  The second heart sound is physiologically split.  There is a loud first heart sound and there is a prominent opening snap.  There is a soft murmur of mitral stenosis at apex. There is no abnormal lift or heave.  The abdomen is soft and nontender.  The bowel sounds are normal.  The liver and spleen are not enlarged.  There are no  abdominal masses.  There are no abdominal bruits.  Extremities reveal good pedal pulses.  There is no phlebitis or edema.  There is no cyanosis or clubbing.  Strength is normal and symmetrical in all extremities.  There is no lateralizing weakness.  There are no sensory deficits.  The skin is warm and dry.  There is no rash.      Assessment / Plan: Continue same medication.  Update chest x-ray for heart size.  Recheck in 4 months for office visit.

## 2012-12-13 ENCOUNTER — Ambulatory Visit (INDEPENDENT_AMBULATORY_CARE_PROVIDER_SITE_OTHER): Payer: Medicare Other

## 2012-12-13 ENCOUNTER — Encounter: Payer: Self-pay | Admitting: Podiatry

## 2012-12-13 ENCOUNTER — Ambulatory Visit (INDEPENDENT_AMBULATORY_CARE_PROVIDER_SITE_OTHER): Payer: Medicare Other | Admitting: Podiatry

## 2012-12-13 VITALS — BP 107/50 | HR 62 | Resp 16 | Ht 61.0 in | Wt 149.6 lb

## 2012-12-13 DIAGNOSIS — M79674 Pain in right toe(s): Secondary | ICD-10-CM

## 2012-12-13 DIAGNOSIS — M79609 Pain in unspecified limb: Secondary | ICD-10-CM

## 2012-12-13 DIAGNOSIS — M2040 Other hammer toe(s) (acquired), unspecified foot: Secondary | ICD-10-CM

## 2012-12-13 DIAGNOSIS — M779 Enthesopathy, unspecified: Secondary | ICD-10-CM

## 2012-12-13 MED ORDER — TRIAMCINOLONE ACETONIDE 10 MG/ML IJ SUSP
5.0000 mg | Freq: Once | INTRAMUSCULAR | Status: AC
  Administered 2012-12-13: 5 mg via INTRA_ARTICULAR

## 2012-12-13 NOTE — Progress Notes (Signed)
Subjective:     Patient ID: Mary Farrell, female   DOB: Jun 19, 1945, 67 y.o.   MRN: [id number]  Toe Pain    patient presents stating my fourth toe right foot is swollen and painful. States it's been this way for about 6 weeks   Review of Systems  All other systems reviewed and are negative.       Objective:   Physical Exam  Nursing note and vitals reviewed. Constitutional: She is oriented to person, place, and time. She appears well-developed and well-nourished.  Cardiovascular: Intact distal pulses.   Musculoskeletal: Normal range of motion.  Neurological: She is oriented to person, place, and time.  Skin: Skin is warm.   patient's right fourth toe distal joint is inflamed with tenderness when pressed. 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 Proximal nerve block done and injected the joint with 3 mg dexamethasone Kenalog combination and advised to return if symptoms were to persist

## 2012-12-13 NOTE — Progress Notes (Signed)
  Subjective:    Patient ID: Mary Farrell, female    DOB: March 05, 1945, 67 y.o.   MRN: [id number]  HPI Comments: N - tender L - 4th toe right D - October 9th 2014 O - gradual C - redness, swollen, no better A - shoes T - open shoes, soaking      Review of Systems  Musculoskeletal: Positive for arthralgias.  All other systems reviewed and are negative.       Objective:   Physical Exam        Assessment & Plan:

## 2012-12-20 ENCOUNTER — Ambulatory Visit (INDEPENDENT_AMBULATORY_CARE_PROVIDER_SITE_OTHER)
Admission: RE | Admit: 2012-12-20 | Discharge: 2012-12-20 | Disposition: A | Discharge status: Home / Self Care | Payer: Medicare Other | Source: Ambulatory Visit | Attending: Cardiology | Admitting: Cardiology

## 2012-12-20 DIAGNOSIS — I119 Hypertensive heart disease without heart failure: Secondary | ICD-10-CM

## 2012-12-20 DIAGNOSIS — I080 Rheumatic disorders of both mitral and aortic valves: Secondary | ICD-10-CM

## 2012-12-25 ENCOUNTER — Telehealth: Payer: Self-pay | Admitting: Cardiology

## 2012-12-25 NOTE — Telephone Encounter (Signed)
Advised patient

## 2012-12-25 NOTE — Telephone Encounter (Signed)
New message  Patient would like results of xrays, please call and advise.

## 2012-12-25 NOTE — Telephone Encounter (Signed)
Message copied by Burnell Blanks on Mon Dec 25, 2012  6:27 PM ------      Message from: Cassell Clement      Created: Wed Dec 20, 2012 12:48 PM       Please report.  The chest x-ray shows no change in heart size.  The heart shadow is still at upper limits of normal in size. ------

## 2013-04-13 ENCOUNTER — Ambulatory Visit (INDEPENDENT_AMBULATORY_CARE_PROVIDER_SITE_OTHER): Payer: Medicare Other | Admitting: Cardiology

## 2013-04-13 ENCOUNTER — Encounter: Payer: Self-pay | Admitting: Cardiology

## 2013-04-13 VITALS — BP 115/57 | HR 64 | Ht 61.0 in | Wt 148.0 lb

## 2013-04-13 DIAGNOSIS — R0609 Other forms of dyspnea: Secondary | ICD-10-CM

## 2013-04-13 DIAGNOSIS — R0600 Dyspnea, unspecified: Secondary | ICD-10-CM

## 2013-04-13 DIAGNOSIS — I080 Rheumatic disorders of both mitral and aortic valves: Secondary | ICD-10-CM

## 2013-04-13 DIAGNOSIS — R0989 Other specified symptoms and signs involving the circulatory and respiratory systems: Secondary | ICD-10-CM

## 2013-04-13 DIAGNOSIS — I119 Hypertensive heart disease without heart failure: Secondary | ICD-10-CM

## 2013-04-13 NOTE — Assessment & Plan Note (Signed)
Her dyspnea has been about the same but she has been having occasional transient twinge of discomfort in the substernal area lasting less than a minute.  Not related to exercise.  She feels that she is out of shape.  She now has a membership with silver sneakers and intends to implement it.

## 2013-04-13 NOTE — Patient Instructions (Signed)
Your physician recommends that you continue on your current medications as directed. Please refer to the Current Medication list given to you today.  Your physician wants you to follow-up in: 4 month ov You will receive a reminder letter in the mail two months in advance. If you don't receive a letter, please call our office to schedule the follow-up appointment.   Your physician has requested that you have an echocardiogram. Echocardiography is a painless test that uses sound waves to create images of your heart. It provides your doctor with information about the size and shape of your heart and how well your heart's chambers and valves are working. This procedure takes approximately one hour. There are no restrictions for this procedure.

## 2013-04-13 NOTE — Assessment & Plan Note (Signed)
The patient has not had any TIA symptoms from her mitral stenosis.  She remains in normal sinus rhythm.  We'll plan to update her echo

## 2013-04-13 NOTE — Progress Notes (Signed)
Mary Farrell Date of Birth:  07/16/1945 [address] [place], [place]  [address] [phone]  Fax   [phone]  HPI: This pleasant 68 year old woman is seen for a scheduled four-month followup office visit. 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 She does not have any history of ischemic heart disease. She had a cardiac catheterization in 1996 and she had a normal Cardiolite stress test in 2007. Her last echocardiogram in August 2011 showed mild mitral stenosis and mild to moderate aortic insufficiency and normal pulmonary artery pressure. She is maintaining normal sinus rhythm. She has cardiomegaly by chest x-ray but no symptoms of CHF.  Her last chest x-ray was 12/20/12 and showed heart size stable at the upper limits of normal.   Current Outpatient Prescriptions  Medication Sig Dispense Refill  . ALPRAZolam (XANAX) 0.25 MG tablet One daily as needed  90 tablet  1  . aspirin 81 MG tablet Take 81 mg by mouth daily.        . B Complex Vitamins (VITAMIN B COMPLEX PO) Take 1 capsule by mouth daily.        . Calcium Carbonate (CALCIUM 500 PO) Take 500 mg by mouth daily.        . Cholecalciferol (VITAMIN D) 2000 UNITS CAPS Take 2,000 Units by mouth daily.        Marland Kitchen CLIMARA 0.075 MG/24HR as directed.      . hydrochlorothiazide (HYDRODIURIL) 25 MG tablet Take 1 tablet (25 mg total) by mouth daily.  90 tablet  3  . ibuprofen (ADVIL,MOTRIN) 200 MG tablet Take 600 mg by mouth as needed.        . loratadine (CLARITIN) 10 MG tablet Take 10 mg by mouth daily.      Marland Kitchen losartan (COZAAR) 100 MG tablet TAKE 1 TABLET DAILY  90 tablet  3  . multivitamin (THERAGRAN) per tablet Take 1 tablet by mouth daily.        . nadolol (CORGARD) 20 MG tablet TAKE 1 TABLET DAILY  90 tablet  3  . omeprazole (PRILOSEC) 40 MG capsule Take 40 mg by mouth daily.       . psyllium (METAMUCIL) 58.6 % powder Take 1 packet by mouth daily as needed.       No  current facility-administered medications for this visit.    No Known Allergies  Patient Active Problem List   Diagnosis Date Noted  . Anxiety 06/29/2012  . GERD (gastroesophageal reflux disease) 03/11/2011  . Dyspnea 10/30/2010  . Mitral stenosis and aortic insufficiency 06/23/2010    History  Smoking status  . Never Smoker   Smokeless tobacco  . Never Used    History  Alcohol Use No    Family History  Problem Relation Age of Onset  . Heart attack Mother     Review of Systems: The patient denies any heat or cold intolerance.  No weight gain or weight loss.  The patient denies headaches or blurry vision.  There is no cough or sputum production.  The patient denies dizziness.  There is no hematuria or hematochezia.  The patient denies any muscle aches or arthritis.  The patient denies any rash.  The patient denies frequent falling or instability.  There is no history of depression or anxiety.  All other systems were reviewed and are negative.   Physical Exam: Filed Vitals:   04/13/13 1534  BP: 115/57  Pulse: 64   the general  appearance reveals a well-developed well-nourished woman in no distress.The head and neck exam reveals pupils equal and reactive.  Extraocular movements are full.  There is no scleral icterus.  The mouth and pharynx are normal.  The neck is supple.  The carotids reveal no bruits.  The jugular venous pressure is normal.  The  thyroid is not enlarged.  There is no lymphadenopathy.  The chest is clear to percussion and auscultation.  There are no rales or rhonchi.  Expansion of the chest is symmetrical.  The precordium is quiet.  The first heart sound is normal.  The second heart sound is physiologically split.  There is a loud first heart sound and there is a prominent opening snap.  There is a soft murmur of mitral stenosis at apex. There is no abnormal lift or heave.  The abdomen is soft and nontender.  The bowel sounds are normal.  The liver and spleen are  not enlarged.  There are no abdominal masses.  There are no abdominal bruits.  Extremities reveal good pedal pulses.  There is no phlebitis or edema.  There is no cyanosis or clubbing.  Strength is normal and symmetrical in all extremities.  There is no lateralizing weakness.  There are no sensory deficits.  The skin is warm and dry.  There is no rash.      Assessment / Plan: Continue same medication.  I encouraged her to start participating in the silver sneakers program that her insurance provides for her.  We will update her 2-D echo regarding her valvular heart disease.  Recheck in 4 months for office visit.

## 2013-04-27 ENCOUNTER — Ambulatory Visit (HOSPITAL_COMMUNITY): Payer: Medicare Other | Attending: Cardiovascular Disease | Admitting: Radiology

## 2013-04-27 DIAGNOSIS — I080 Rheumatic disorders of both mitral and aortic valves: Secondary | ICD-10-CM

## 2013-04-27 DIAGNOSIS — R079 Chest pain, unspecified: Secondary | ICD-10-CM

## 2013-04-27 DIAGNOSIS — I471 Supraventricular tachycardia, unspecified: Secondary | ICD-10-CM

## 2013-04-27 DIAGNOSIS — I050 Rheumatic mitral stenosis: Secondary | ICD-10-CM | POA: Insufficient documentation

## 2013-04-27 DIAGNOSIS — R0609 Other forms of dyspnea: Secondary | ICD-10-CM

## 2013-04-27 DIAGNOSIS — I119 Hypertensive heart disease without heart failure: Secondary | ICD-10-CM

## 2013-04-27 NOTE — Progress Notes (Signed)
Echocardiogram Performed. 

## 2013-04-28 ENCOUNTER — Other Ambulatory Visit: Payer: Self-pay | Admitting: Cardiology

## 2013-04-30 ENCOUNTER — Other Ambulatory Visit: Payer: Self-pay | Admitting: *Deleted

## 2013-04-30 DIAGNOSIS — F419 Anxiety disorder, unspecified: Secondary | ICD-10-CM

## 2013-04-30 MED ORDER — ALPRAZOLAM 0.25 MG PO TABS: ORAL_TABLET | ORAL | Status: DC

## 2013-04-30 NOTE — Telephone Encounter (Signed)
Script faxed to [facility] for aprazolam per Dr Mare Ferrari

## 2013-05-01 ENCOUNTER — Telehealth: Payer: Self-pay | Admitting: Cardiology

## 2013-05-01 MED ORDER — HYDROCHLOROTHIAZIDE 25 MG PO TABS: 25.0000 mg | ORAL_TABLET | Freq: Every day | ORAL | Status: DC

## 2013-05-01 NOTE — Telephone Encounter (Signed)
New Message:  Pt states she would like a call back from Mary Farrell. Pt states she will give more details when the nurse calls back.

## 2013-05-01 NOTE — Telephone Encounter (Signed)
Continue same meds. We will plan to recheck her echo in about a year to follow it more closely.No indication for need of surgery yet.

## 2013-05-01 NOTE — Telephone Encounter (Signed)
Message copied by Earvin Hansen on Tue May 01, 2013 11:01 AM ------      Message from: Darlin Coco      Created: Fri Apr 27, 2013  8:52 PM       Please report.  The mitral stenosis has progressed since 2011 and is now moderate. The aortic insufficiency is still mild. There is still a moderate pericardial effusion, unchanged. CSD. ------

## 2013-05-01 NOTE — Telephone Encounter (Signed)
Results given to patient. Patient states that she does have these twinges of discomfort at time with increased shortness of breath. Sometimes the shortness of breath she gets just talking. No worse than when seen. Will forward to  Dr. Mare Ferrari for review

## 2013-05-03 NOTE — Telephone Encounter (Signed)
Advised patient, will back if worsening in shortness of breath

## 2013-07-20 ENCOUNTER — Other Ambulatory Visit: Payer: Self-pay | Admitting: Cardiology

## 2013-07-26 ENCOUNTER — Ambulatory Visit (INDEPENDENT_AMBULATORY_CARE_PROVIDER_SITE_OTHER): Payer: Medicare Other

## 2013-07-26 ENCOUNTER — Ambulatory Visit (INDEPENDENT_AMBULATORY_CARE_PROVIDER_SITE_OTHER): Payer: Medicare Other | Admitting: Podiatry

## 2013-07-26 ENCOUNTER — Encounter: Payer: Self-pay | Admitting: Podiatry

## 2013-07-26 VITALS — BP 100/51 | HR 65 | Resp 14 | Ht 60.0 in | Wt 148.2 lb

## 2013-07-26 DIAGNOSIS — M779 Enthesopathy, unspecified: Secondary | ICD-10-CM

## 2013-07-26 DIAGNOSIS — M79609 Pain in unspecified limb: Secondary | ICD-10-CM

## 2013-07-26 DIAGNOSIS — M79672 Pain in left foot: Secondary | ICD-10-CM

## 2013-07-26 MED ORDER — TRIAMCINOLONE ACETONIDE 10 MG/ML IJ SUSP
10.0000 mg | Freq: Once | INTRAMUSCULAR | Status: AC
  Administered 2013-07-26: 10 mg

## 2013-07-26 NOTE — Progress Notes (Signed)
   Subjective:    Patient ID: Mary Farrell, female    DOB: 1946/01/07, 68 y.o.   MRN: [id number]  HPI Comments: Pt states she is beginning to have pain in the right 4th DPJ toe, for 1 month.  Pt complains of aching pain in left midfoot that radiates from dorsal to plantar, for 6 weeks,  pt states walking seems to be the big trigger.  Toe Pain   Foot Pain      Review of Systems     Objective:   Physical Exam        Assessment & Plan:

## 2013-07-28 NOTE — Progress Notes (Signed)
Subjective:     Patient ID: Mary Farrell, female   DOB: 12-05-1945, 68 y.o.   MRN: [id number]  HPI patient states she is having pain in the right fourth digit distal interphalangeal joint and is also now getting quite a bit of pain in the dorsum of the left foot in the midtarsal joint with inflammation of the tendon group   Review of Systems     Objective:   Physical Exam Neurovascular status is intact with muscle strength adequate and is noted to have inflammation in the interphalangeal joint fourth toe right and quite a bit of discomfort in the dorsum of the midfoot and the extensor tendon complex    Assessment:     Inflammatory capsulitis tendinitis condition    Plan:     2 separate injections administered 1 to the interphalangeal joint fourth right 1.5 mg dexamethasone Kenalog 3 mg Xylocaine and 1 to the dorsum of the left foot with 5 mg Kenalog dexamethasone and 10 mg Xylocaine reappoint as

## 2013-09-05 ENCOUNTER — Encounter: Payer: Self-pay | Admitting: Cardiology

## 2013-09-05 ENCOUNTER — Ambulatory Visit (INDEPENDENT_AMBULATORY_CARE_PROVIDER_SITE_OTHER): Payer: Medicare Other | Admitting: Cardiology

## 2013-09-05 VITALS — BP 122/76 | HR 58 | Ht 60.0 in | Wt 149.0 lb

## 2013-09-05 DIAGNOSIS — I10 Essential (primary) hypertension: Secondary | ICD-10-CM

## 2013-09-05 DIAGNOSIS — I119 Hypertensive heart disease without heart failure: Secondary | ICD-10-CM

## 2013-09-05 DIAGNOSIS — K219 Gastro-esophageal reflux disease without esophagitis: Secondary | ICD-10-CM

## 2013-09-05 DIAGNOSIS — I080 Rheumatic disorders of both mitral and aortic valves: Secondary | ICD-10-CM

## 2013-09-05 HISTORY — DX: Essential (primary) hypertension: I10

## 2013-09-05 NOTE — Assessment & Plan Note (Signed)
The patient sleeps on 2 pillows.  She does not have any paroxysmal nocturnal dyspnea.  She has not had any increase in exertional dyspnea.  She has not had any TIA or stroke symptoms.

## 2013-09-05 NOTE — Progress Notes (Signed)
Mary Farrell Date of Birth:  1945-06-26 [facility] [address] [place], [place]  [address] [phone]        Fax   [phone]   History of Present Illness: This pleasant 68 year old woman is seen for a scheduled four-month followup office visit. 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 She does not have any history of ischemic heart disease. She had a cardiac catheterization in 1996 and she had a normal Cardiolite stress test in 2007.  Echocardiogram on 04/27/13 showed an ejection fraction of 55-60%, moderate mitral stenosis, and mild aortic insufficiency. She is maintaining normal sinus rhythm. She has cardiomegaly by chest x-ray but no symptoms of CHF. Her last chest x-ray was 12/20/12 and showed heart size stable at the upper limits of normal. Since last visit she has had no new cardiac symptoms.   Current Outpatient Prescriptions  Medication Sig Dispense Refill  . ALPRAZolam (XANAX) 0.25 MG tablet One daily as needed  90 tablet  1  . aspirin 81 MG tablet Take 81 mg by mouth daily.        . B Complex Vitamins (VITAMIN B COMPLEX PO) Take 1 capsule by mouth daily.        . Calcium Carbonate (CALCIUM 500 PO) Take 500 mg by mouth daily.        . Cholecalciferol (VITAMIN D) 2000 UNITS CAPS Take 2,000 Units by mouth daily.        . hydrochlorothiazide (HYDRODIURIL) 25 MG tablet Take 25 mg by mouth as directed. 1/2 tablet daily      . ibuprofen (ADVIL,MOTRIN) 200 MG tablet Take 600 mg by mouth as needed.        . loratadine (CLARITIN) 10 MG tablet Take 10 mg by mouth daily.      Marland Kitchen losartan (COZAAR) 100 MG tablet Take 1 tablet by mouth  daily  30 tablet  1  . multivitamin (THERAGRAN) per tablet Take 1 tablet by mouth daily.        . nadolol (CORGARD) 20 MG tablet TAKE 1 TABLET DAILY  90 tablet  3  . omeprazole (PRILOSEC) 40 MG capsule Take 40 mg by mouth daily.       . psyllium (METAMUCIL) 58.6 % powder Take 1  packet by mouth daily as needed.      Marland Kitchen CLIMARA 0.075 MG/24HR as directed.       No current facility-administered medications for this visit.    No Known Allergies  Patient Active Problem List   Diagnosis Date Noted  . Anxiety 06/29/2012  . GERD (gastroesophageal reflux disease) 03/11/2011  . Dyspnea 10/30/2010  . Mitral stenosis and aortic insufficiency 06/23/2010    History  Smoking status  . Never Smoker   Smokeless tobacco  . Never Used    History  Alcohol Use No    Family History  Problem Relation Age of Onset  . Heart attack Mother     Review of Systems: Constitutional: no fever chills diaphoresis or fatigue or change in weight.  Head and neck: no hearing loss, no epistaxis, no photophobia or visual disturbance. Respiratory: No cough, shortness of breath or wheezing. Cardiovascular: No chest pain peripheral edema, palpitations. Gastrointestinal: No abdominal distention, no abdominal pain, no change in bowel habits hematochezia or melena. Genitourinary: No dysuria, no frequency, no urgency, no nocturia. Musculoskeletal:No arthralgias, no back pain, no gait disturbance or myalgias. Neurological: No dizziness, no headaches, no numbness, no  seizures, no syncope, no weakness, no tremors. Hematologic: No lymphadenopathy, no easy bruising. Psychiatric: No confusion, no hallucinations, no sleep disturbance.    Physical Exam: Filed Vitals:   09/05/13 0822  BP: 122/76  Pulse: 58   the general appearance reveals a well-developed well-nourished woman in no distress.The head and neck exam reveals pupils equal and reactive.  Extraocular movements are full.  There is no scleral icterus.  The mouth and pharynx are normal.  The neck is supple.  The carotids reveal no bruits.  The jugular venous pressure is normal.  The  thyroid is not enlarged.  There is no lymphadenopathy.  The chest is clear to percussion and auscultation.  There are no rales or rhonchi.  Expansion of the  chest is symmetrical.  The precordium is quiet.  The first heart sound is normal.  The second heart sound is physiologically split.  There is an opening snap followed by low pitched diastolic rumble of mitral stenosis.  There is no abnormal lift or heave.  The abdomen is soft and nontender.  The bowel sounds are normal.  The liver and spleen are not enlarged.  There are no abdominal masses.  There are no abdominal bruits.  Extremities reveal good pedal pulses.  There is no phlebitis or edema.  There is no cyanosis or clubbing.  Strength is normal and symmetrical in all extremities.  There is no lateralizing weakness.  There are no sensory deficits.  The skin is warm and dry.  There is no rash.     Assessment / Plan: 1. valvular heart disease with moderate mitral stenosis and mild aortic insufficiency 2. essential hypertension without heart failure 3. normal coronary arteries by cardiac catheterization 1996 and normal stress Cardiolite 2007 4. GERD  Plan: Continue same medication.  Reduce hydrochlorothiazide to just half tablet daily.  Recheck in 4 months for office visit and EKG

## 2013-09-05 NOTE — Assessment & Plan Note (Signed)
The patient has a history of reflux but has not been having any recent symptoms.  She remains on omeprazole 40 mg daily.

## 2013-09-05 NOTE — Assessment & Plan Note (Signed)
Blood pressures remained normal.  Patient feels that the full dose of HCTZ is too strong for her.  We will decrease the dose to just 12.5 mg daily.

## 2013-09-05 NOTE — Patient Instructions (Signed)
DECREASE YOUR HCTZ 25 MG TO 1/2 TABLET DAILY  Your physician recommends that you schedule a follow-up appointment in: 4 month ov/ekg

## 2013-09-24 ENCOUNTER — Other Ambulatory Visit: Payer: Self-pay | Admitting: Cardiology

## 2013-11-22 ENCOUNTER — Encounter: Payer: Self-pay | Admitting: Cardiology

## 2013-12-31 ENCOUNTER — Telehealth: Payer: Self-pay

## 2013-12-31 ENCOUNTER — Other Ambulatory Visit: Payer: Self-pay

## 2013-12-31 DIAGNOSIS — F419 Anxiety disorder, unspecified: Secondary | ICD-10-CM

## 2013-12-31 MED ORDER — ALPRAZOLAM 0.25 MG PO TABS: ORAL_TABLET | ORAL | Status: DC

## 2013-12-31 NOTE — Telephone Encounter (Signed)
Tried to print Rx and unable will try again tomorrow

## 2013-12-31 NOTE — Telephone Encounter (Signed)
Okay to refill xanax. 

## 2014-01-02 ENCOUNTER — Encounter: Payer: Self-pay | Admitting: Cardiology

## 2014-01-02 ENCOUNTER — Ambulatory Visit (INDEPENDENT_AMBULATORY_CARE_PROVIDER_SITE_OTHER): Payer: Medicare Other | Admitting: Cardiology

## 2014-01-02 VITALS — BP 110/68 | HR 63 | Ht 60.0 in | Wt 151.0 lb

## 2014-01-02 DIAGNOSIS — I10 Essential (primary) hypertension: Secondary | ICD-10-CM

## 2014-01-02 DIAGNOSIS — K219 Gastro-esophageal reflux disease without esophagitis: Secondary | ICD-10-CM

## 2014-01-02 DIAGNOSIS — I080 Rheumatic disorders of both mitral and aortic valves: Secondary | ICD-10-CM

## 2014-01-02 DIAGNOSIS — R0609 Other forms of dyspnea: Secondary | ICD-10-CM

## 2014-01-02 MED ORDER — HYDROCHLOROTHIAZIDE 12.5 MG PO TABS: 12.5000 mg | ORAL_TABLET | Freq: Every day | ORAL | Status: DC

## 2014-01-02 MED ORDER — ALPRAZOLAM 0.25 MG PO TABS: ORAL_TABLET | ORAL | Status: DC

## 2014-01-02 NOTE — Progress Notes (Signed)
Mary Farrell Date of Birth:  April 30, 1945 [facility] [address] [place], [place]  [address] [phone]        Fax   [phone]   History of Present Illness: This pleasant 68 year old woman is seen for a scheduled four-month followup office visit. 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 She does not have any history of ischemic heart disease. She had a cardiac catheterization in 1996 and she had a normal Cardiolite stress test in 2007.  Echocardiogram on 04/27/13 showed an ejection fraction of 55-60%, moderate mitral stenosis, and mild aortic insufficiency. She is maintaining normal sinus rhythm. She has cardiomegaly by chest x-ray but no symptoms of CHF. Her last chest x-ray was 12/20/12 and showed heart size stable at the upper limits of normal. Since last visit she has had no new cardiac symptoms. She had a annual physical in November and had lab work at that time which she states was satisfactory.  She forgot to bring it with her today. The patient is in [employer] and has lost weight since last visit.  Current Outpatient Prescriptions  Medication Sig Dispense Refill  . aspirin 81 MG tablet Take 81 mg by mouth daily.      . B Complex Vitamins (VITAMIN B COMPLEX PO) Take 1 capsule by mouth daily.      . Calcium Carbonate (CALCIUM 500 PO) Take 500 mg by mouth daily.      . Cholecalciferol (VITAMIN D) 2000 UNITS CAPS Take 2,000 Units by mouth daily.      . fexofenadine (ALLEGRA) 180 MG tablet Take 180 mg by mouth daily.    . hydrochlorothiazide (HYDRODIURIL) 12.5 MG tablet Take 1 tablet (12.5 mg total) by mouth daily. 90 tablet 3  . ibuprofen (ADVIL,MOTRIN) 200 MG tablet Take 600 mg by mouth as needed.      Marland Kitchen losartan (COZAAR) 100 MG tablet Take 1 tablet by mouth  daily 90 tablet 3  . multivitamin (THERAGRAN) per tablet Take 1 tablet by mouth daily.      . nadolol (CORGARD) 20 MG tablet Take 1 tablet by  mouth  daily 90 tablet 3  . omeprazole (PRILOSEC) 40 MG capsule Take 40 mg by mouth daily.     . psyllium (METAMUCIL) 58.6 % powder Take 1 packet by mouth daily as needed.    . ALPRAZolam (XANAX) 0.25 MG tablet One daily as needed 90 tablet 1   No current facility-administered medications for this visit.    No Known Allergies  Patient Active Problem List   Diagnosis Date Noted  . Essential hypertension 09/05/2013  . Anxiety 06/29/2012  . GERD (gastroesophageal reflux disease) 03/11/2011  . Dyspnea 10/30/2010  . Mitral stenosis and aortic insufficiency 06/23/2010    History  Smoking status  . Never Smoker   Smokeless tobacco  . Never Used    History  Alcohol Use No    Family History  Problem Relation Age of Onset  . Heart attack Mother     Review of Systems: Constitutional: no fever chills diaphoresis or fatigue or change in weight.  Head and neck: no hearing loss, no epistaxis, no photophobia or visual disturbance. Respiratory: No cough, shortness of breath or wheezing. Cardiovascular: No chest pain peripheral edema, palpitations. Gastrointestinal: No abdominal distention, no abdominal pain, no change in bowel habits hematochezia or melena. Genitourinary: No dysuria, no frequency, no urgency, no nocturia. Musculoskeletal:No arthralgias, no back pain, no gait  disturbance or myalgias. Neurological: No dizziness, no headaches, no numbness, no seizures, no syncope, no weakness, no tremors. Hematologic: No lymphadenopathy, no easy bruising. Psychiatric: No confusion, no hallucinations, no sleep disturbance.    Physical Exam: Filed Vitals:   01/02/14 1356  BP: 110/68  Pulse: 63   the general appearance reveals a well-developed well-nourished woman in no distress.The head and neck exam reveals pupils equal and reactive.  Extraocular movements are full.  There is no scleral icterus.  The mouth and pharynx are normal.  The neck is supple.  The carotids reveal no bruits.   The jugular venous pressure is normal.  The  thyroid is not enlarged.  There is no lymphadenopathy.  The chest is clear to percussion and auscultation.  There are no rales or rhonchi.  Expansion of the chest is symmetrical.  The precordium is quiet.  The first heart sound is normal.  The second heart sound is physiologically split.  There is an opening snap followed by low pitched diastolic rumble of mitral stenosis.  There is no abnormal lift or heave.  The abdomen is soft and nontender.  The bowel sounds are normal.  The liver and spleen are not enlarged.  There are no abdominal masses.  There are no abdominal bruits.  Extremities reveal good pedal pulses.  There is no phlebitis or edema.  There is no cyanosis or clubbing.  Strength is normal and symmetrical in all extremities.  There is no lateralizing weakness.  There are no sensory deficits.  The skin is warm and dry.  There is no rash.    EKG shows sinus rhythm and possible left atrial enlargement and is unchanged since 04/13/13  Impression: 1.  Valvular heart disease with mitral stenosis and aortic valve insufficiency. 2.  Essential hypertension 3.  GERD  Disposition: Continue current medication.  Recheck in 4 months for office visit.

## 2014-01-02 NOTE — Telephone Encounter (Signed)
Printed to fax to [facility] rx

## 2014-01-02 NOTE — Assessment & Plan Note (Signed)
No symptoms of increased shortness of breath or CHF.

## 2014-01-02 NOTE — Assessment & Plan Note (Signed)
Her blood pressure is stable on current therapy.  No headaches dizziness or syncope.

## 2014-01-02 NOTE — Assessment & Plan Note (Signed)
She has not had any flareup of symptoms of GERD

## 2014-01-02 NOTE — Patient Instructions (Addendum)
Your physician recommends that you continue on your current medications as directed. Please refer to the Current Medication list given to you today.   Your physician wants you to follow-up in: 4 MONTH You will receive a reminder letter in the mail two months in advance. If you don't receive a letter, please call our office to schedule the follow-up appointment. Marland Kitchen

## 2014-01-14 ENCOUNTER — Ambulatory Visit (INDEPENDENT_AMBULATORY_CARE_PROVIDER_SITE_OTHER): Payer: Medicare Other | Admitting: Podiatry

## 2014-01-14 VITALS — BP 110/66 | HR 60 | Resp 16

## 2014-01-14 DIAGNOSIS — M779 Enthesopathy, unspecified: Secondary | ICD-10-CM

## 2014-01-14 MED ORDER — TRIAMCINOLONE ACETONIDE 10 MG/ML IJ SUSP
10.0000 mg | Freq: Once | INTRAMUSCULAR | Status: AC
  Administered 2014-01-14: 10 mg

## 2014-01-14 NOTE — Progress Notes (Signed)
Subjective:     Patient ID: Mary Farrell, female   DOB: 1945/09/23, 68 y.o.   MRN: [id number]  HPI patient presents stating I'm getting a lot of pain on top of my left foot again and in the fourth toe of the right foot has become painful. States that she had about 4-1/2 months of relief before pain recurred and it was more due to prolonged weightbearing   Review of Systems     Objective:   Physical Exam Neurovascular status unchanged with patient's muscle strength range of motion within normal limits. Patient's noted to have discomfort on the dorsum of the left foot in the extensor tendon group and is noted to have interphalangeal joint capsulitis distal fourth toe right foot with pain when pressed    Assessment:     Tendinitis dorsum left foot and interphalangeal joint capsulitis fourth toe right foot    Plan:     Reviewed both conditions and did a injection of the left midtarsal joint along the extensor tendon complex 3 mg Kenalog 5 g Xylocaine and injected the interphalangeal joint right fourth toe 1 mg Kenalog 2 mg Xylocaine and padded the fourth toe right foot. Reappoint when symptomatic again

## 2014-03-25 ENCOUNTER — Ambulatory Visit (INDEPENDENT_AMBULATORY_CARE_PROVIDER_SITE_OTHER): Payer: Medicare Other | Admitting: Podiatry

## 2014-03-25 ENCOUNTER — Encounter: Payer: Self-pay | Admitting: Podiatry

## 2014-03-25 ENCOUNTER — Ambulatory Visit (INDEPENDENT_AMBULATORY_CARE_PROVIDER_SITE_OTHER): Payer: Medicare Other

## 2014-03-25 VITALS — BP 139/69 | HR 60 | Resp 12

## 2014-03-25 DIAGNOSIS — M722 Plantar fascial fibromatosis: Secondary | ICD-10-CM | POA: Diagnosis not present

## 2014-03-25 DIAGNOSIS — R52 Pain, unspecified: Secondary | ICD-10-CM

## 2014-03-25 DIAGNOSIS — Z472 Encounter for removal of internal fixation device: Secondary | ICD-10-CM

## 2014-03-25 MED ORDER — TRIAMCINOLONE ACETONIDE 10 MG/ML IJ SUSP
10.0000 mg | Freq: Once | INTRAMUSCULAR | Status: AC
  Administered 2014-03-25: 10 mg

## 2014-03-25 NOTE — Progress Notes (Signed)
   Subjective:    Patient ID: Mary Farrell, female    DOB: 12-10-1945, 69 y.o.   MRN: [id number]  HPI  PT STATED RT TOP OF THE FOOT HAVE SHARP PAIN SINCE LAST NIGHT. FOOT IS GETTING WORSE ESPECIALLY WHEN GET UP OR PUTTING PRESSURE ON IT. TRIED NO TREATMENT.  Review of Systems  All other systems reviewed and are negative.      Objective:   Physical Exam        Assessment & Plan:

## 2014-03-26 NOTE — Progress Notes (Signed)
Subjective:     Patient ID: Mary Farrell, female   DOB: 01-Jan-1946, 69 y.o.   MRN: [id number]  HPI patient states it all of a sudden started to hurt on top of my metatarsal and I noted a prominence and it's also hurting in the right arch with inflammation and fluid buildup making it difficult for me to do any type of activity   Review of Systems     Objective:   Physical Exam Neurovascular status intact with muscle strength adequate and range of motion somewhat diminished on the right side due to pain. There is a well-healed surgical scar on the right first metatarsal and there is a prominence of pin within the shaft of the right first metatarsal along with discomfort in the right arch that's quite sore when pressed with fluid buildup noted    Assessment:     Abnormal pin position right with plantar fasciitis right mid arch    Plan:     Reviewed both conditions and at this point since the pain is popped up and is symptomatic I've recommended removal. She wants this done and at this time I allowed her to read a consent form for correction explaining all alternative treatments and complications as listed. Patient signed consent form after review and is scheduled for office surgery and today I did inject the right arch 3 mg Kenalog 5 mg Xylocaine and dispensed fascial brace. I also reviewed her x-rays

## 2014-03-27 ENCOUNTER — Ambulatory Visit (INDEPENDENT_AMBULATORY_CARE_PROVIDER_SITE_OTHER): Payer: Medicare Other | Admitting: Podiatry

## 2014-03-27 ENCOUNTER — Encounter: Payer: Self-pay | Admitting: Podiatry

## 2014-03-27 VITALS — BP 149/66 | HR 56 | Resp 14

## 2014-03-27 DIAGNOSIS — Z472 Encounter for removal of internal fixation device: Secondary | ICD-10-CM

## 2014-03-27 NOTE — Progress Notes (Signed)
   Subjective:    Patient ID: Mary Farrell, female    DOB: 05-16-1945, 69 y.o.   MRN: [id number]  HPI Comments: DOS 03/27/2014 right foot after care deep fixation removal k-wire.  Pt states she continues to have dorsal right foot pain even after shot.     Review of Systems     Objective:   Physical Exam        Assessment & Plan:

## 2014-03-28 ENCOUNTER — Ambulatory Visit: Payer: Medicare Other | Admitting: Podiatry

## 2014-03-28 NOTE — Progress Notes (Signed)
Subjective:     Patient ID: Mary Farrell, female   DOB: 12-Aug-1945, 69 y.o.   MRN: [id number]  HPI patient presents for pin removal of the right first metatarsal that has become painful and difficult to wear shoe gear with   Review of Systems     Objective:   Physical Exam Neurovascular status intact with prominent pin position first metatarsal shaft right    Assessment:     Abnormal pin position first metatarsal right foot    Plan:     Anesthetized with 5 mL of Xylocaine Marcaine mixture and brought patient to the operating room. Sterile prep to the right foot and then inflated tourniquet to 250 mmHg right foot. Expose the right first metatarsal did sterile prep directly to the area and then utilized a 15 blade and made a small incision over the metatarsal shaft of approximately 2 cm deep in through 16 he is tissue with hemostasis being acquired as necessary down to capsule where a linear capsular incision was performed. Capsular tissue was sharply dissected off the underlying bone and the pin was exposed and utilizing sharp and blunt dissection it was freed from surrounding tissue. It was removed with a large needle driver and was found to be satisfactorily removed entirely. The wound was flushed and sutured with 5-0 nylon. Sterile dressing was applied the tourniquet was released and capillary refill was noted to be immediate to all digits. Patient discharged to recovery and then left the building in satisfactory health

## 2014-04-10 ENCOUNTER — Encounter: Payer: Self-pay | Admitting: Podiatry

## 2014-04-10 ENCOUNTER — Ambulatory Visit (INDEPENDENT_AMBULATORY_CARE_PROVIDER_SITE_OTHER): Payer: Medicare Other | Admitting: Podiatry

## 2014-04-10 ENCOUNTER — Ambulatory Visit (INDEPENDENT_AMBULATORY_CARE_PROVIDER_SITE_OTHER): Payer: Medicare Other

## 2014-04-10 VITALS — BP 140/66 | HR 56 | Resp 16

## 2014-04-10 DIAGNOSIS — Z9889 Other specified postprocedural states: Secondary | ICD-10-CM

## 2014-04-10 DIAGNOSIS — Z472 Encounter for removal of internal fixation device: Secondary | ICD-10-CM

## 2014-04-11 NOTE — Progress Notes (Signed)
Subjective:     Patient ID: Mary Farrell, female   DOB: 28-Apr-1945, 69 y.o.   MRN: [id number]  HPI patient presents stating she's doing fine   Review of Systems     Objective:   Physical Exam Neurovascular status intact with patient noted to have well-healing surgical site dorsum left foot with wound edges well coapted stitches intact    Assessment:     Doing well post pin removal left    Plan:     H&P and final x-ray reviewed with patient. Stitches removed and sterile dressing applied and instructed on wound care and if any issues were to occur to let us know immediately

## 2014-06-27 ENCOUNTER — Encounter: Payer: Self-pay | Admitting: Cardiology

## 2014-06-27 ENCOUNTER — Ambulatory Visit (INDEPENDENT_AMBULATORY_CARE_PROVIDER_SITE_OTHER): Payer: Medicare Other | Admitting: Cardiology

## 2014-06-27 VITALS — BP 124/62 | HR 60 | Ht 60.0 in | Wt 154.8 lb

## 2014-06-27 DIAGNOSIS — R079 Chest pain, unspecified: Secondary | ICD-10-CM

## 2014-06-27 DIAGNOSIS — I10 Essential (primary) hypertension: Secondary | ICD-10-CM | POA: Diagnosis not present

## 2014-06-27 DIAGNOSIS — I080 Rheumatic disorders of both mitral and aortic valves: Secondary | ICD-10-CM

## 2014-06-27 DIAGNOSIS — R0609 Other forms of dyspnea: Secondary | ICD-10-CM

## 2014-06-27 NOTE — Progress Notes (Signed)
Cardiology Office Note   Date:  06/27/2014   ID:  Mary Farrell, DOB 11-01-1945, MRN [id number]  PCP:   Melinda Crutch, MD  Cardiologist: Darlin Coco MD  Chief Complaint  Patient presents with  . Follow-up      History of Present Illness: Mary Farrell is a 69 y.o. female who presents for scheduled four-month follow-up office visit.  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 She does not have any history of ischemic heart disease. She had a cardiac catheterization in 1996 and she had a normal Cardiolite stress test in 2007. Echocardiogram on 04/27/13 showed an ejection fraction of 55-60%, moderate mitral stenosis, and mild aortic insufficiency. She is maintaining normal sinus rhythm. She has cardiomegaly by chest x-ray but no symptoms of CHF. Her last chest x-ray was 12/20/12 and showed heart size stable at the upper limits of normal. Since last visit she has had some intermittent episodes of what she describes as a "weird feeling" in her chest.  It occurs at random times.  It is not exertional.  It can occur when she is sitting quietly or when she is in bed.  It starts in the left chest and radiates to the left scapula.  It lasted about 5 minutes.  No diaphoresis or other symptoms.   The patient has not been experiencing any regular exercise.  She has the option to join silver sneakers but has not done so.  She is having difficulty losing weight.  We are setting her weight watcher's goal at all 155 pounds.  Past Medical History  Diagnosis Date  . Mitral stenosis with insufficiency   . Coronary disease   . History of echocardiogram 09/11/2009    showed right ventricular systolic pressure of 31 and showing mild mitral stenosis mild mitral regurgitation and moderate aortic insufficiency and showed a moderate pericardial effusion but no evidence of hemodynamic compromise or tamponade                       . History of cardiovascular stress  test 2007    showing no ischemia  . Personal history of rheumatic heart disease   . Murmur     of mitral stenosis and moderate aortic insufficiency      . Dyspnea   . Fatigue   . Palpitations     occasional  . Racing heart beat   . SBE (subacute bacterial endocarditis)     prophylaxis  . Hypertension   . SOB (shortness of breath)   . Headache, migraine     history of   . Exogenous obesity     history of     Past Surgical History  Procedure Laterality Date  . Cardiac catheterization  01/13/1995    normal coronary anatomy and mild mitral stenosis and mild pulmonary hypertension     Current Outpatient Prescriptions  Medication Sig Dispense Refill  . ALPRAZolam (XANAX) 0.25 MG tablet Take 0.25 mg by mouth at bedtime as needed for anxiety.    Marland Kitchen aspirin 81 MG tablet Take 81 mg by mouth daily.      . B Complex Vitamins (VITAMIN B COMPLEX PO) Take 1 capsule by mouth daily.      . Calcium Carbonate (CALCIUM 500 PO) Take 500 mg by mouth daily.      . Cholecalciferol (VITAMIN D) 2000 UNITS CAPS Take 2,000 Units by mouth daily.      . fexofenadine (  ALLEGRA) 180 MG tablet Take 180 mg by mouth daily.    . hydrochlorothiazide (HYDRODIURIL) 12.5 MG tablet Take 1 tablet (12.5 mg total) by mouth daily. 90 tablet 3  . ibuprofen (ADVIL,MOTRIN) 200 MG tablet Take 600 mg by mouth as needed for fever or moderate pain.     Marland Kitchen latanoprost (XALATAN) 0.005 % ophthalmic solution Place 1 drop into both eyes daily.  6  . losartan (COZAAR) 100 MG tablet Take 1 tablet by mouth  daily 90 tablet 3  . multivitamin (THERAGRAN) per tablet Take 1 tablet by mouth daily.      . nadolol (CORGARD) 20 MG tablet Take 1 tablet by mouth  daily 90 tablet 3  . omeprazole (PRILOSEC) 40 MG capsule Take 40 mg by mouth daily.     . psyllium (METAMUCIL) 58.6 % powder Take 1 packet by mouth daily as needed (for constipation).      No current facility-administered medications for this visit.    Allergies:   Review of  patient's allergies indicates no known allergies.    Social History:  The patient  reports that she has never smoked. She has never used smokeless tobacco. She reports that she does not drink alcohol or use illicit drugs.   Family History:  The patient's family history includes Heart attack in her mother.    ROS:  Please see the history of present illness.   Otherwise, review of systems are positive for none.   All other systems are reviewed and negative.    PHYSICAL EXAM: VS:  BP 124/62 mmHg  Pulse 60  Ht 5' (1.524 m)  Wt 154 lb 12.8 oz (70.217 kg)  BMI 30.23 kg/m2 , BMI Body mass index is 30.23 kg/(m^2). GEN: Well nourished, well developed, in no acute distress HEENT: normal Neck: no JVD, carotid bruits, or masses Cardiac: RRR; there is an accentuated first heart sound.  Following the second sound there is a diastolic rumble at the left sternal edge and apex. Respiratory:  clear to auscultation bilaterally, normal work of breathing GI: soft, nontender, nondistended, + BS MS: no deformity or atrophy Skin: warm and dry, no rash Neuro:  Strength and sensation are intact Psych: euthymic mood, full affect   EKG:  EKG is not ordered today.    Recent Labs: No results found for requested labs within last 365 days.    Lipid Panel No results found for: CHOL, TRIG, HDL, CHOLHDL, VLDL, LDLCALC, LDLDIRECT    Wt Readings from Last 3 Encounters:  06/27/14 154 lb 12.8 oz (70.217 kg)  01/02/14 151 lb (68.493 kg)  09/05/13 149 lb (67.586 kg)         ASSESSMENT AND PLAN:  1. Valvular heart disease with mitral stenosis and aortic valve insufficiency. 2. Essential hypertension 3. GERD 4.  Atypical chest pain 5.  Mild hypercholesterolemia.  Her last LDL was 112 when she had her annual physical with her PCP in October 2015  Disposition: Continue current medication. Recheck in 4 months for office visit.  To evaluate her chest pain we will update her echocardiogram.  Current  medicines are reviewed at length with the patient today.  The patient does not have concerns regarding medicines.  The following changes have been made:  no change  Labs/ tests ordered today include:   Orders Placed This Encounter  Procedures  . ECHOCARDIOGRAM COMPLETE       Signed, Darlin Coco MD 06/27/2014 9:50 AM    [facility]  [address], [place], [place]  [address] Phone: [phone]; Fax: [phone]

## 2014-06-27 NOTE — Patient Instructions (Signed)
Medication Instructions:  Your physician recommends that you continue on your current medications as directed. Please refer to the Current Medication list given to you today.  Labwork: NONE  Testing/Procedures: Your physician has requested that you have an echocardiogram. Echocardiography is a painless test that uses sound waves to create images of your heart. It provides your doctor with information about the size and shape of your heart and how well your heart's chambers and valves are working. This procedure takes approximately one hour. There are no restrictions for this procedure.  Follow-Up: Your physician wants you to follow-up in: [place] will receive a reminder letter in the mail two months in advance. If you don't receive a letter, please call our office to schedule the follow-up appointment.

## 2014-07-04 ENCOUNTER — Ambulatory Visit (HOSPITAL_COMMUNITY): Payer: Medicare Other | Attending: Cardiology

## 2014-07-04 ENCOUNTER — Other Ambulatory Visit: Payer: Self-pay

## 2014-07-04 DIAGNOSIS — I080 Rheumatic disorders of both mitral and aortic valves: Secondary | ICD-10-CM

## 2014-07-04 DIAGNOSIS — I313 Pericardial effusion (noninflammatory): Secondary | ICD-10-CM | POA: Insufficient documentation

## 2014-07-04 DIAGNOSIS — R0609 Other forms of dyspnea: Secondary | ICD-10-CM | POA: Diagnosis not present

## 2014-07-04 DIAGNOSIS — I050 Rheumatic mitral stenosis: Secondary | ICD-10-CM | POA: Diagnosis present

## 2014-07-09 ENCOUNTER — Telehealth: Payer: Self-pay

## 2014-07-09 DIAGNOSIS — R0600 Dyspnea, unspecified: Secondary | ICD-10-CM

## 2014-07-09 NOTE — Telephone Encounter (Signed)
-----   Message from Darlin Coco, MD sent at 07/05/2014  8:47 PM EDT ----- Please report.  The echo is stable and shows moderate mitral stenosis and persistent moderate pericardial effusion.  If she has not had recent thyroid check I would like her to get a TSH and FT4.

## 2014-07-10 ENCOUNTER — Other Ambulatory Visit (INDEPENDENT_AMBULATORY_CARE_PROVIDER_SITE_OTHER): Payer: Medicare Other | Admitting: *Deleted

## 2014-07-10 DIAGNOSIS — R0600 Dyspnea, unspecified: Secondary | ICD-10-CM

## 2014-07-10 LAB — T4, FREE: FREE T4: 0.71 ng/dL (ref 0.60–1.60)

## 2014-07-10 LAB — TSH: TSH: 1.52 u[IU]/mL (ref 0.35–4.50)

## 2014-07-10 NOTE — Progress Notes (Signed)
Quick Note:  Please report to patient. The recent labs are stable. Continue same medication and careful diet. Thyroid levels are normal. ______

## 2014-08-22 ENCOUNTER — Other Ambulatory Visit: Payer: Self-pay | Admitting: Cardiology

## 2014-08-23 ENCOUNTER — Other Ambulatory Visit: Payer: Self-pay | Admitting: *Deleted

## 2014-08-23 MED ORDER — ALPRAZOLAM 0.25 MG PO TABS: 0.2500 mg | ORAL_TABLET | Freq: Every evening | ORAL | Status: DC | PRN

## 2014-08-23 NOTE — Telephone Encounter (Signed)
Received a fax request from [facility] requesting Rx for Xanax Signed by  Dr. Mare Ferrari and faxed back

## 2014-09-24 DIAGNOSIS — R300 Dysuria: Secondary | ICD-10-CM | POA: Insufficient documentation

## 2014-09-24 DIAGNOSIS — N3010 Interstitial cystitis (chronic) without hematuria: Secondary | ICD-10-CM | POA: Insufficient documentation

## 2014-09-24 DIAGNOSIS — N94819 Vulvodynia, unspecified: Secondary | ICD-10-CM | POA: Insufficient documentation

## 2014-10-04 ENCOUNTER — Ambulatory Visit: Payer: Medicare Other | Admitting: Podiatry

## 2014-10-11 ENCOUNTER — Ambulatory Visit (INDEPENDENT_AMBULATORY_CARE_PROVIDER_SITE_OTHER): Payer: Medicare Other | Admitting: Podiatry

## 2014-10-11 ENCOUNTER — Encounter: Payer: Self-pay | Admitting: Podiatry

## 2014-10-11 VITALS — BP 113/55 | HR 66 | Resp 16

## 2014-10-11 DIAGNOSIS — M779 Enthesopathy, unspecified: Secondary | ICD-10-CM | POA: Diagnosis not present

## 2014-10-11 MED ORDER — TRIAMCINOLONE ACETONIDE 10 MG/ML IJ SUSP
10.0000 mg | Freq: Once | INTRAMUSCULAR | Status: AC
  Administered 2014-10-11: 10 mg

## 2014-10-13 NOTE — Progress Notes (Signed)
Subjective:     Patient ID: Mary Farrell, female   DOB: [birth year], 69 y.o.   MRN: [id number]  HPI patient states the top of both my feet have been hurting me and the medication really helps   Review of Systems     Objective:   Physical Exam Neurovascular status intact muscle strength adequate range of motion was found to be within normal limits and patient noted to have inflammation of the dorsum of both feet around the midtarsal joint with fluid buildup noted    Assessment:     Tendinitis of the dorsal foot bilateral with pain    Plan:     Injected the dorsal tendon complex bilateral 3 mg Kenalog 5 mg Xylocaine and instructed on physical therapy. Reappoint

## 2014-12-05 ENCOUNTER — Ambulatory Visit (INDEPENDENT_AMBULATORY_CARE_PROVIDER_SITE_OTHER): Payer: Medicare Other | Admitting: Cardiology

## 2014-12-05 ENCOUNTER — Encounter: Payer: Self-pay | Admitting: Cardiology

## 2014-12-05 VITALS — BP 118/60 | HR 55 | Ht 60.0 in | Wt 156.8 lb

## 2014-12-05 DIAGNOSIS — R079 Chest pain, unspecified: Secondary | ICD-10-CM | POA: Diagnosis not present

## 2014-12-05 DIAGNOSIS — I10 Essential (primary) hypertension: Secondary | ICD-10-CM | POA: Diagnosis not present

## 2014-12-05 DIAGNOSIS — I080 Rheumatic disorders of both mitral and aortic valves: Secondary | ICD-10-CM

## 2014-12-05 NOTE — Patient Instructions (Signed)
Medication Instructions:  Your physician recommends that you continue on your current medications as directed. Please refer to the Current Medication list given to you today.  Labwork: NONE  Testing/Procedures: NONE  Follow-Up: Your physician recommends that you schedule a follow-up appointment in: [place] [facility] OR [facility] PA   If you need a refill on your cardiac medications before your next appointment, please call your pharmacy.

## 2014-12-05 NOTE — Progress Notes (Signed)
Cardiology Office Note   Date:  12/05/2014   ID:  Shealeigh, Kutch 07-21-1945, MRN [id number]  PCP:   Melinda Crutch, MD  Cardiologist: Darlin Coco MD  Chief Complaint  Patient presents with  . Hypertension      History of Present Illness: Mary Farrell is a 69 y.o. female who presents for  Four-month follow-up visit.  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 She does not have any history of ischemic heart disease. She had a cardiac catheterization in 1996 and she had a normal Cardiolite stress test in 2007. Echocardiogram on 04/27/13 showed an ejection fraction of 55-60%, moderate mitral stenosis, and mild aortic insufficiency. She is maintaining normal sinus rhythm. She has cardiomegaly by chest x-ray but no symptoms of CHF. Her last chest x-ray was 12/20/12 and showed heart size stable at the upper limits of normal. Her last echocardiogram on 07/04/14 showed an ejection fraction of 6065% and showed stable moderate mitral stenosis and mild aortic insufficiency and a stable moderate pericardial effusion.  the patient has not been any symptoms of congestive heart failure such as orthopnea or paroxysmal nocturnal dyspnea or peripheral edema. She has not had any atrial fibrillation. She has not had any TIA or stroke symptoms.   Past Medical History  Diagnosis Date  . Mitral stenosis with insufficiency   . Coronary disease   . History of echocardiogram 09/11/2009    showed right ventricular systolic pressure of 31 and showing mild mitral stenosis mild mitral regurgitation and moderate aortic insufficiency and showed a moderate pericardial effusion but no evidence of hemodynamic compromise or tamponade                       . History of cardiovascular stress test 2007    showing no ischemia  . Personal history of rheumatic heart disease   . Murmur     of mitral stenosis and moderate aortic insufficiency      . Dyspnea   . Fatigue   .  Palpitations     occasional  . Racing heart beat   . SBE (subacute bacterial endocarditis) (HCC)     prophylaxis  . Hypertension   . SOB (shortness of breath)   . Headache, migraine     history of   . Exogenous obesity     history of     Past Surgical History  Procedure Laterality Date  . Cardiac catheterization  01/13/1995    normal coronary anatomy and mild mitral stenosis and mild pulmonary hypertension     Current Outpatient Prescriptions  Medication Sig Dispense Refill  . ALPRAZolam (XANAX) 0.25 MG tablet Take 0.25 mg by mouth as needed for anxiety.    Marland Kitchen aspirin 81 MG tablet Take 81 mg by mouth daily.      . B Complex Vitamins (VITAMIN B COMPLEX PO) Take 1 capsule by mouth daily.      . Calcium Carbonate (CALCIUM 500 PO) Take 500 mg by mouth daily.      . Cholecalciferol (VITAMIN D) 2000 UNITS CAPS Take 2,000 Units by mouth daily.      Marland Kitchen estradiol (ESTRACE) 0.5 MG tablet Take 0.5 mg by mouth daily.    Marland Kitchen gabapentin (NEURONTIN) 300 MG capsule Take 300 mg by mouth 3 (three) times daily.    . hydrochlorothiazide (HYDRODIURIL) 12.5 MG tablet Take 1 tablet (12.5 mg total) by mouth daily. 90 tablet 3  .  hydrOXYzine (VISTARIL) 25 MG capsule TAKE 1 CAPSULE (25 MG TOTAL) BY MOUTH NIGHTLY.  11  . ibuprofen (ADVIL,MOTRIN) 200 MG tablet Take 600 mg by mouth as needed for fever or moderate pain.     Marland Kitchen latanoprost (XALATAN) 0.005 % ophthalmic solution Place 1 drop into both eyes daily.  6  . losartan (COZAAR) 100 MG tablet Take 1 tablet by mouth  daily 90 tablet 2  . multivitamin (THERAGRAN) per tablet Take 1 tablet by mouth daily.      . nadolol (CORGARD) 20 MG tablet Take 1 tablet by mouth  daily 90 tablet 2  . omeprazole (PRILOSEC) 40 MG capsule Take 40 mg by mouth daily.     . pentosan polysulfate (ELMIRON) 100 MG capsule Take 200 mg by mouth 2 (two) times daily.     . psyllium (METAMUCIL) 58.6 % powder Take 1 packet by mouth daily as needed (for constipation).      No current  facility-administered medications for this visit.    Allergies:   Review of patient's allergies indicates no known allergies.    Social History:  The patient  reports that she has never smoked. She has never used smokeless tobacco. She reports that she does not drink alcohol or use illicit drugs.   Family History:  The patient's family history includes Heart attack in her mother.    ROS:  Please see the history of present illness.   Otherwise, review of systems are positive for none.   All other systems are reviewed and negative.    PHYSICAL EXAM: VS:  BP 118/60 mmHg  Pulse 55  Ht 5' (1.524 m)  Wt 156 lb 12.8 oz (71.124 kg)  BMI 30.62 kg/m2 , BMI Body mass index is 30.62 kg/(m^2). GEN: Well nourished, well developed, in no acute distress HEENT: normal Neck: no JVD, carotid bruits, or masses Cardiac: RRR;  The first heart sound is accentuated. There is an opening snap followed by a soft diastolic rumble at apex. There is no edema area pedal pulses are good. Respiratory:  clear to auscultation bilaterally, normal work of breathing GI: soft, nontender, nondistended, + BS MS: no deformity or atrophy Skin: warm and dry, no rash Neuro:  Strength and sensation are intact Psych: euthymic mood, full affect   EKG:  EKG is ordered today. The ekg ordered today demonstrates  Sinus bradycardia at 55 bpm.  Otherwise normal EKG.   Recent Labs: 07/10/2014: TSH 1.52    Lipid Panel No results found for: CHOL, TRIG, HDL, CHOLHDL, VLDL, LDLCALC, LDLDIRECT    Wt Readings from Last 3 Encounters:  12/05/14 156 lb 12.8 oz (71.124 kg)  06/27/14 154 lb 12.8 oz (70.217 kg)  01/02/14 151 lb (68.493 kg)        ASSESSMENT AND PLAN:  1. Valvular heart disease with mitral stenosis and aortic valve insufficiency. 2. Essential hypertension 3. GERD 4. Atypical chest pain 5. Mild hypercholesterolemia. Her last LDL was 112 when she had her annual physical with her PCP in October  2015   Current medicines are reviewed at length with the patient today.  The patient does not have concerns regarding medicines.  The following changes have been made:  no change  Labs/ tests ordered today include:   Orders Placed This Encounter  Procedures  . EKG 12-Lead      disposition: Continue current medication. Recheck in 4 months for follow-up office visit with Cecille Rubin.  Berna Spare MD 12/05/2014 5:39 PM    [facility] [place], [place], [place]  [address] Phone: [phone]; Fax: [phone]

## 2015-01-02 ENCOUNTER — Other Ambulatory Visit: Payer: Self-pay | Admitting: Cardiology

## 2015-04-09 ENCOUNTER — Encounter: Payer: Self-pay | Admitting: Cardiovascular Disease

## 2015-04-09 ENCOUNTER — Ambulatory Visit: Payer: Medicare Other | Admitting: Nurse Practitioner

## 2015-05-07 ENCOUNTER — Ambulatory Visit: Payer: Medicare Other | Admitting: Nurse Practitioner

## 2015-05-07 ENCOUNTER — Ambulatory Visit (INDEPENDENT_AMBULATORY_CARE_PROVIDER_SITE_OTHER): Payer: Medicare Other | Admitting: Nurse Practitioner

## 2015-05-07 ENCOUNTER — Encounter: Payer: Self-pay | Admitting: Nurse Practitioner

## 2015-05-07 VITALS — BP 110/66 | HR 57 | Ht 60.0 in | Wt 162.8 lb

## 2015-05-07 DIAGNOSIS — I38 Endocarditis, valve unspecified: Secondary | ICD-10-CM | POA: Diagnosis not present

## 2015-05-07 NOTE — Patient Instructions (Addendum)
We will be checking the following labs today - NONE   Medication Instructions:    Continue with your current medicines.     Testing/Procedures To Be Arranged:  Echocardiogram  Follow-Up:   See Dr. Oval Linsey in 3 months    Other Special Instructions:  Here are my tips to lose weight:  1. Drink only water. You do not need milk, juice, tea, soda or diet soda.  2. Do not eat anything "white". This includes white bread, potatoes, rice or mayo  3. Stay away from fried foods and sweets  4. Your portion should be the size of the palm of your hand.  5. Know what your weaknesses are and avoid.  6. Find an exercise you like and do it every day for 45 to 60 minutes.          If you need a refill on your cardiac medications before your next appointment, please call your pharmacy.   Call the [place] office at [phone] if you have any questions, problems or concerns.

## 2015-05-07 NOTE — Progress Notes (Signed)
CARDIOLOGY OFFICE NOTE  Date:  05/07/2015    Mary Farrell Date of Birth: 06-Apr-1945 Medical Record [id number]  PCP:   Mary Crutch, MD  Cardiologist:  Former patient of Dr. Sherryl Farrell - to establish with Dr. Oval Farrell  Chief Complaint  Patient presents with  . Cardiac Valve Problem    History of Present Illness: Mary Farrell is a 70 y.o. female who presents today for a 5 month check. Former patient of Dr. Mare Farrell.   She has a history of valvular heart disease, presumed rheumatic heart disease, chronic pericardial effusion, fatigue and palpitations. She also has interstitial cystitis along with vulvodynia. Remote stress test from 2007 was without ischemia. Normal coronaries noted on remote cath from 1996. Last echo from   Last seen in November. Felt to be doing ok.   Comes in today. Here with her husband - he sees Dr. [place]. She notes that she remains short of breath with exertion - maybe getting worse. She has not been exercising. She wanted to start in [place] but then got vertigo the whole month of March - this has now improved. She will have "twinges" of chest pain - mostly with reaching with the right arm. Nothing exertional. Weight is up. She admits her diet is not good. She does not like the current Weight Watchers plan. She does have a treadmill but due to her vulvodynia - basically her "vagina is always on fire" - she does not like to wear underwear and she does not leave her house much because of this. Labs are checked by PCP.    Past Medical History  Diagnosis Date  . Mitral stenosis with insufficiency   . Coronary disease     remote cath in 1996 with NORMAL coronaries noted  . History of cardiovascular stress test 2007    showing no ischemia  . Personal history of rheumatic heart disease   . Murmur     of mitral stenosis and moderate aortic insufficiency      . Palpitations     occasional  . SBE (subacute bacterial endocarditis) (HCC)     prophylaxis    . Hypertension   . Headache, migraine     history of   . Exogenous obesity     history of     Past Surgical History  Procedure Laterality Date  . Cardiac catheterization  01/13/1995    normal coronary anatomy and mild mitral stenosis and mild pulmonary hypertension     Medications: Current Outpatient Prescriptions  Medication Sig Dispense Refill  . ALPRAZolam (XANAX) 0.25 MG tablet Take 0.25 mg by mouth as needed for anxiety.    Marland Kitchen aspirin 81 MG tablet Take 81 mg by mouth daily.      . B Complex Vitamins (VITAMIN B COMPLEX PO) Take 1 capsule by mouth daily.      . Calcium Carbonate (CALCIUM 500 PO) Take 500 mg by mouth daily.      . Cholecalciferol (VITAMIN D) 2000 UNITS CAPS Take 2,000 Units by mouth daily.      . dorzolamide-timolol (COSOPT) 22.3-6.8 MG/ML ophthalmic solution PLACE 1 DROP INTO EACH EYE TWICE A DAY  6  . estradiol (ESTRACE) 0.5 MG tablet Take 0.5 mg by mouth daily.    Marland Kitchen gabapentin (NEURONTIN) 300 MG capsule Take 300 mg by mouth 3 (three) times daily.    . hydrochlorothiazide (HYDRODIURIL) 12.5 MG tablet Take 1 tablet by mouth  daily 90 tablet 3  . hydrOXYzine (VISTARIL) 25  MG capsule TAKE 1 CAPSULE (25 MG TOTAL) BY MOUTH NIGHTLY.  11  . latanoprost (XALATAN) 0.005 % ophthalmic solution Place 1 drop into both eyes daily.  6  . losartan (COZAAR) 100 MG tablet Take 1 tablet by mouth  daily 90 tablet 2  . multivitamin (THERAGRAN) per tablet Take 1 tablet by mouth daily.      . nadolol (CORGARD) 20 MG tablet Take 1 tablet by mouth  daily 90 tablet 2  . omeprazole (PRILOSEC) 40 MG capsule Take 40 mg by mouth daily.     . psyllium (METAMUCIL) 58.6 % powder Take 1 packet by mouth daily as needed (for constipation).      No current facility-administered medications for this visit.    Allergies: No Known Allergies  Social History: The patient  reports that she has never smoked. She has never used smokeless tobacco. She reports that she does not drink alcohol or use  illicit drugs.   Family History: The patient's family history includes Heart attack in her mother. Brother has had heart disease and CHF.  Review of Systems: Please see the history of present illness.   Otherwise, the review of systems is positive for none.   All other systems are reviewed and negative.   Physical Exam: VS:  BP 110/66 mmHg  Pulse 57  Ht 5' (1.524 m)  Wt 162 lb 12.8 oz (73.846 kg)  BMI 31.79 kg/m2 .  BMI Body mass index is 31.79 kg/(m^2).  Wt Readings from Last 3 Encounters:  05/07/15 162 lb 12.8 oz (73.846 kg)  12/05/14 156 lb 12.8 oz (71.124 kg)  06/27/14 154 lb 12.8 oz (70.217 kg)    General: Pleasant. Well developed, well nourished and in no acute distress. She has gained weight.  HEENT: Normal. Neck: Supple, no JVD, carotid bruits, or masses noted.  Cardiac: Regular rate and rhythm. Soft diastolic murmur noted. No edema.  Respiratory:  Lungs are clear to auscultation bilaterally with normal work of breathing.  GI: Soft and nontender.  MS: No deformity or atrophy. Gait and ROM intact. Skin: Warm and dry. Color is normal.  Neuro:  Strength and sensation are intact and no gross focal deficits noted.  Psych: Alert, appropriate and with normal affect.   LABORATORY DATA:  EKG:  EKG is ordered today. This demonstrates sinus bradycardia.  Lab Results  Component Value Date   TSH 1.52 07/10/2014    BNP (last 3 results) No results for input(s): BNP in the last 8760 hours.  ProBNP (last 3 results) No results for input(s): PROBNP in the last 8760 hours.   Other Studies Reviewed Today:  Notes Recorded by Darlin Coco, MD on 07/05/2014 at 8:47 PM Please report. The echo is stable and shows moderate mitral stenosis and persistent moderate pericardial effusion. If she has not had recent thyroid check I would like her to get a TSH and FT4.   Echo Study Conclusions from 06/2014  - Left ventricle: The cavity size was normal. Wall thickness was  normal.  Systolic function was normal. The estimated ejection  fraction was in the range of 60% to 65%. - Aortic valve: There was mild regurgitation. - Mitral valve: MV is thickened, mildly calcified with restricted  motion. Peak and mean gradients across the valve are 17 and 6 mm  Hg respectively. MVA by P T 1/2 is 1.33 cm2 consistent with  moderate MS. - Left atrium: The atrium was severely dilated. - Pulmonary arteries: PA peak pressure: 41 mm Hg (S). -  Pericardium, extracardiac: Moderate pericardial effusion  surrounds heart Measures 14 mm in maximal dimension.  Impressions:  - No significant change from echo of 2015. Moderate pericardial  effusion surrounds heart Measure 14 mm in maximal dimension.  Assessment/Plan: 1. Valvular heart disease - has DOE - may be worse - will get her echo updated - may need to have echos followed every 6 months. Will get her established with Dr. Oval Farrell going forward.  2. Chronic pericardial effusion - updating her echo.    3. Atypical chest pain - felt to be musculoskeletal  4. Obesity - discussed at length. Gave her my tips for weight loss. Exercise will be key - I understand with her GYN issues that this makes this challenging but we did talk about what she could do in the comfort of her own home.   5. Probable rheumatic heart disease  6. HTN - BP looks good on her current regimen.   Current medicines are reviewed with the patient today.  The patient does not have concerns regarding medicines other than what has been noted above.  The following changes have been made:  See above.  Labs/ tests ordered today include:    Orders Placed This Encounter  Procedures  . EKG 12-Lead  . ECHOCARDIOGRAM COMPLETE     Disposition:   FU with Dr. Oval Farrell in 3 months.    Patient is agreeable to this plan and will call if any problems develop in the interim.   Signed: Burtis Junes, RN, ANP-C 05/07/2015 12:52 PM  [facility] [address] [place], [place]  [address] Phone: [phone] Fax: [phone]

## 2015-05-23 ENCOUNTER — Other Ambulatory Visit: Payer: Self-pay

## 2015-05-23 ENCOUNTER — Ambulatory Visit (HOSPITAL_COMMUNITY): Payer: Medicare Other | Attending: Cardiovascular Disease

## 2015-05-23 DIAGNOSIS — I313 Pericardial effusion (noninflammatory): Secondary | ICD-10-CM | POA: Diagnosis not present

## 2015-05-23 DIAGNOSIS — I38 Endocarditis, valve unspecified: Secondary | ICD-10-CM | POA: Diagnosis not present

## 2015-05-23 DIAGNOSIS — I052 Rheumatic mitral stenosis with insufficiency: Secondary | ICD-10-CM | POA: Insufficient documentation

## 2015-05-23 DIAGNOSIS — R0600 Dyspnea, unspecified: Secondary | ICD-10-CM | POA: Diagnosis present

## 2015-05-23 DIAGNOSIS — I351 Nonrheumatic aortic (valve) insufficiency: Secondary | ICD-10-CM | POA: Insufficient documentation

## 2015-05-23 DIAGNOSIS — I517 Cardiomegaly: Secondary | ICD-10-CM | POA: Insufficient documentation

## 2015-06-02 NOTE — Progress Notes (Signed)
Currently with no known evidence of CAD.  Remote normal cardiac cath.   Burtis Junes, RN, [facility] [address] [place], [place]  [id number] [phone]

## 2015-06-06 ENCOUNTER — Other Ambulatory Visit: Payer: Self-pay | Admitting: *Deleted

## 2015-06-06 MED ORDER — LOSARTAN POTASSIUM 100 MG PO TABS: ORAL_TABLET | ORAL | Status: DC

## 2015-06-06 MED ORDER — NADOLOL 20 MG PO TABS: ORAL_TABLET | ORAL | Status: DC

## 2015-08-12 ENCOUNTER — Ambulatory Visit (INDEPENDENT_AMBULATORY_CARE_PROVIDER_SITE_OTHER): Payer: Medicare Other | Admitting: Cardiovascular Disease

## 2015-08-12 ENCOUNTER — Encounter: Payer: Self-pay | Admitting: Cardiovascular Disease

## 2015-08-12 VITALS — BP 124/69 | HR 70 | Ht 60.0 in | Wt 159.0 lb

## 2015-08-12 DIAGNOSIS — I10 Essential (primary) hypertension: Secondary | ICD-10-CM | POA: Diagnosis not present

## 2015-08-12 DIAGNOSIS — I351 Nonrheumatic aortic (valve) insufficiency: Secondary | ICD-10-CM | POA: Diagnosis not present

## 2015-08-12 DIAGNOSIS — R079 Chest pain, unspecified: Secondary | ICD-10-CM | POA: Diagnosis not present

## 2015-08-12 DIAGNOSIS — I319 Disease of pericardium, unspecified: Secondary | ICD-10-CM

## 2015-08-12 DIAGNOSIS — I313 Pericardial effusion (noninflammatory): Secondary | ICD-10-CM

## 2015-08-12 DIAGNOSIS — I3139 Other pericardial effusion (noninflammatory): Secondary | ICD-10-CM

## 2015-08-12 LAB — RHEUMATOID FACTOR

## 2015-08-12 NOTE — Progress Notes (Signed)
Cardiology Office Note   Date:  08/13/2015   ID:  Mary Farrell, DOB 1945-10-28, MRN [id number]  PCP:   Melinda Crutch, MD  Cardiologist:   Skeet Latch, MD   Chief Complaint  Patient presents with  . New Patient (Initial Visit)    pt states SOB     History of Present Illness: Mary Farrell is a 70 y.o. female with moderate aortic regurgitation, mild-moderate mitral stenosis, chronic pericardial effusion, and hypertension who presents for follow up on exertional dyspnea.  Mary Farrell was previously a patient of Dr. Mare Ferrari.  She followed up with Truitt Merle on 04/2015 due to shortness of breath that she felt was getting worse.  She was referred for an echo 04/2015 that revealed LVEF 55-60% with moderate AR and mild-moderate MS.  There was also a moderate pericardial effusion but no evidence of tamponade.  She had a heart cath 12/1994 that revealed normal coronaries with mild MS and mild pulmonary hypertension.  She also had a Cardiolite in 2007 that was negative for ischemia.    Mary Farrell continues to have shortness of breath with minimal exertion. She notices it when walking across the room. She denies orthopnea or PND and has not noted any lower extremity edema. This has been going on for several months now. She has occasional episodes of sharp substernal chest pain. These occasionally occur with exertion but also occur when she reaches across  her chest.  Mary Farrell does not get much exercise which she attributes both to laziness and BPPV.  She is scheduled to start physical therapy for BPPV.  Lately she has been feeling very dizzy and nauseous. She denies any syncope.   Past Medical History  Diagnosis Date  . Mitral stenosis with insufficiency   . Coronary disease     remote cath in 1996 with NORMAL coronaries noted  . History of cardiovascular stress test 2007    showing no ischemia  . Personal history of rheumatic heart disease   . Murmur     of mitral stenosis and moderate  aortic insufficiency      . Palpitations     occasional  . SBE (subacute bacterial endocarditis) (HCC)     prophylaxis  . Hypertension   . Headache, migraine     history of   . Exogenous obesity     history of   . Aortic regurgitation 08/13/2015    Past Surgical History  Procedure Laterality Date  . Cardiac catheterization  01/13/1995    normal coronary anatomy and mild mitral stenosis and mild pulmonary hypertension     Current Outpatient Prescriptions  Medication Sig Dispense Refill  . ALPRAZolam (XANAX) 0.25 MG tablet Take 0.25 mg by mouth as needed for anxiety.    Marland Kitchen aspirin 81 MG tablet Take 81 mg by mouth daily.      . B Complex Vitamins (VITAMIN B COMPLEX PO) Take 1 capsule by mouth daily.      . Calcium Carbonate (CALCIUM 500 PO) Take 500 mg by mouth daily.      . cetirizine (ZYRTEC) 10 MG tablet Take 10 mg by mouth at bedtime.    . Cholecalciferol (VITAMIN D) 2000 UNITS CAPS Take 2,000 Units by mouth daily.      Marland Kitchen conjugated estrogens (PREMARIN) vaginal cream Place 1 Applicatorful vaginally daily.    . dorzolamide-timolol (COSOPT) 22.3-6.8 MG/ML ophthalmic solution PLACE 1 DROP INTO EACH EYE TWICE A DAY  6  . gabapentin (NEURONTIN) 300 MG  capsule Take 300 mg by mouth 3 (three) times daily.    . hydrochlorothiazide (HYDRODIURIL) 12.5 MG tablet Take 1 tablet by mouth  daily 90 tablet 3  . latanoprost (XALATAN) 0.005 % ophthalmic solution Place 1 drop into both eyes daily.  6  . losartan (COZAAR) 100 MG tablet Take 1 tablet by mouth  daily 90 tablet 3  . multivitamin (THERAGRAN) per tablet Take 1 tablet by mouth daily.      . nadolol (CORGARD) 20 MG tablet Take 1 tablet by mouth  daily 90 tablet 3  . omeprazole (PRILOSEC) 40 MG capsule Take 40 mg by mouth daily.     . psyllium (METAMUCIL) 58.6 % powder Take 1 packet by mouth daily as needed (for constipation).      No current facility-administered medications for this visit.    Allergies:   Review of patient's allergies  indicates no known allergies.    Social History:  The patient  reports that she has never smoked. She has never used smokeless tobacco. She reports that she does not drink alcohol or use illicit drugs.   Family History:  The patient's  family history includes Diabetes in her brother; Heart attack in her mother; Heart failure in her maternal grandfather; Hypertension in her brother; Kidney disease in her brother.    ROS:  Please see the history of present illness.   Otherwise, review of systems are positive for none.   All other systems are reviewed and negative.    PHYSICAL EXAM: VS:  BP 124/69 mmHg  Pulse 70  Ht 5' (1.524 m)  Wt 159 lb (72.122 kg)  BMI 31.05 kg/m2 , BMI Body mass index is 31.05 kg/(m^2). GENERAL:  Well appearing HEENT:  Pupils equal round and reactive, fundi not visualized, oral mucosa unremarkable NECK:  No jugular venous distention, waveform within normal limits, carotid upstroke brisk and symmetric, no bruits, no thyromegaly LYMPHATICS:  No cervical adenopathy LUNGS:  Clear to auscultation bilaterally HEART:  RRR.  PMI not displaced or sustained,S1 and S2 within normal limits, +S3, no S4, no clicks, no rubs, no murmurs ABD:  Flat, positive bowel sounds normal in frequency in pitch, no bruits, no rebound, no guarding, no midline pulsatile mass, no hepatomegaly, no splenomegaly EXT:  2 plus pulses throughout, no edema, no cyanosis no clubbing SKIN:  No rashes no nodules NEURO:  Cranial nerves II through XII grossly intact, motor grossly intact throughout PSYCH:  Cognitively intact, oriented to person place and time   EKG:  EKG is not ordered today. The ekg ordered 05/07/15 demonstraSinus bradycardia rate 57 bpm.  Echo 05/03/15:  Study Conclusions  - Left ventricle: The cavity size was normal. Wall thickness was  increased in a pattern of mild LVH. Systolic function was normal.  The estimated ejection fraction was in the range of 55% to 60%.  Wall motion was  normal; there were no regional wall motion  abnormalities. Ratio of mitral valve peak E velocity to average  of medial and lateral annulus peak E velocity: 46.85.  Longitudinal strain, TDI:- 18 %. - Aortic valve: There was moderate regurgitation. - Mitral valve: Moderately thickened annulus. The findings are  consistent with mild to moderate stenosis. There was mild  regurgitation. Mean gradient (D): 5 mm Hg. Peak gradient (D): 14  mm Hg. Valve area by pressure half-time: 1.33 cm^2. Valve area by  continuity equation (using LVOT flow): 0.94 cm^2. - Left atrium: The atrium was severely dilated. - Right ventricle: The cavity size   was mildly decreased. - Pericardium, extracardiac: A moderate pericardial effusion was  identified circumferential to the heart. There was no evidence of  hemodynamic compromise.  Recent Labs: No results found for requested labs within last 365 days.    Lipid Panel No results found for: CHOL, TRIG, HDL, CHOLHDL, VLDL, LDLCALC, LDLDIRECT    Wt Readings from Last 3 Encounters:  08/12/15 159 lb (72.122 kg)  05/07/15 162 lb 12.8 oz (73.846 kg)  12/05/14 156 lb 12.8 oz (71.124 kg)      ASSESSMENT AND PLAN:  # Atypical chest pain: # Shortness of breath: Mary Farrell had a LHC in 1996 that did not reveal obstructive coronary disease and her stress in 2007 was negative for ischemia.  However, she now has chest discomfort that is different and it has been several years so we will obtain an exercise Myoview to evaluate for ischemia.  We will also   # Mild-moderate mitral stenosis: # Moderate AR: # Rheumatic heart disease: Echo revealed mild-moderate valvular heart disease.  This is not severe enough to cause shortness of breath.  We will continue to monitor for now and manage medically.  # Hypertension: Blood pressure is well-controlled.  Continue HCTZ, losartan, and nadolol.  # Pericardial effusion: There was no evidence of tendinitis and Mary Farrell  symptoms do not seem to be attributable to her effusion. We will check an ESR, ANA, and rheumatoid factor. She had normal thyroid function testing last year and her effusion has not changed.    Current medicines are reviewed at length with the patient today.  The patient does not have concerns regarding medicines.  The following changes have been made:  no change  Labs/ tests ordered today include:   Orders Placed This Encounter  Procedures  . ANA  . Rheumatoid factor  . Sed Rate (ESR)  . Myocardial Perfusion Imaging     Disposition:   FU with Tiffany C. Verona Walk, MD, FACC in 3 months.    This note was written with the assistance of speech recognition software.  Please excuse any transcriptional errors.  Signed, Tiffany C. Buckley, MD, FACC  08/13/2015 10:52 PM    [facility]  

## 2015-08-12 NOTE — Patient Instructions (Signed)
Medication Instructions:  Your physician recommends that you continue on your current medications as directed. Please refer to the Current Medication list given to you today.  Labwork: ANA/RA FACTOR/SED RATE AT [facility] ON THE FIRST FLOOR  Testing/Procedures: Your physician has requested that you have en exercise stress myoview. For further information please visit [email or web address]. Please follow instruction sheet, as given.  Follow-Up: Your physician wants you to follow-up in: 3 [place] will receive a reminder letter in the mail two months in advance. If you don't receive a letter, please call our office to schedule the follow-up appointment.  If you need a refill on your cardiac medications before your next appointment, please call your pharmacy.

## 2015-08-13 ENCOUNTER — Encounter: Payer: Self-pay | Admitting: Cardiovascular Disease

## 2015-08-13 ENCOUNTER — Ambulatory Visit: Payer: Medicare Other | Admitting: Podiatry

## 2015-08-13 DIAGNOSIS — I351 Nonrheumatic aortic (valve) insufficiency: Secondary | ICD-10-CM

## 2015-08-13 HISTORY — DX: Nonrheumatic aortic (valve) insufficiency: I35.1

## 2015-08-13 LAB — ANA: ANA: NEGATIVE

## 2015-08-13 LAB — SEDIMENTATION RATE: SED RATE: 3 mm/h (ref 0–30)

## 2015-08-15 ENCOUNTER — Encounter: Payer: Medicare Other | Admitting: Podiatry

## 2015-08-18 ENCOUNTER — Ambulatory Visit: Payer: Medicare Other | Admitting: Podiatry

## 2015-08-21 ENCOUNTER — Telehealth (HOSPITAL_COMMUNITY): Payer: Self-pay

## 2015-08-21 NOTE — Telephone Encounter (Signed)
Encounter complete. 

## 2015-08-26 ENCOUNTER — Ambulatory Visit (HOSPITAL_COMMUNITY)
Admission: RE | Admit: 2015-08-26 | Discharge: 2015-08-26 | Disposition: A | Discharge status: Home / Self Care | Payer: Medicare Other | Source: Ambulatory Visit | Attending: Cardiology | Admitting: Cardiology

## 2015-08-26 DIAGNOSIS — R0609 Other forms of dyspnea: Secondary | ICD-10-CM | POA: Insufficient documentation

## 2015-08-26 DIAGNOSIS — R0602 Shortness of breath: Secondary | ICD-10-CM | POA: Insufficient documentation

## 2015-08-26 DIAGNOSIS — R110 Nausea: Secondary | ICD-10-CM | POA: Diagnosis not present

## 2015-08-26 DIAGNOSIS — Z8249 Family history of ischemic heart disease and other diseases of the circulatory system: Secondary | ICD-10-CM | POA: Diagnosis not present

## 2015-08-26 DIAGNOSIS — R079 Chest pain, unspecified: Secondary | ICD-10-CM

## 2015-08-26 DIAGNOSIS — I10 Essential (primary) hypertension: Secondary | ICD-10-CM | POA: Insufficient documentation

## 2015-08-26 DIAGNOSIS — R42 Dizziness and giddiness: Secondary | ICD-10-CM | POA: Diagnosis not present

## 2015-08-26 DIAGNOSIS — R002 Palpitations: Secondary | ICD-10-CM | POA: Insufficient documentation

## 2015-08-26 LAB — MYOCARDIAL PERFUSION IMAGING
CHL CUP MPHR: 149 {beats}/min
CHL CUP NUCLEAR SDS: 2
CHL CUP NUCLEAR SRS: 5
CHL CUP NUCLEAR SSS: 7
CHL CUP RESTING HR STRESS: 59 {beats}/min
CSEPED: 6 min
CSEPEW: 7 METS
CSEPHR: 94 %
Exercise duration (sec): 0 s
LV dias vol: 68 mL (ref 46–106)
LV sys vol: 27 mL
NUC STRESS TID: 1.07
Peak HR: 141 {beats}/min
RPE: 16

## 2015-08-26 MED ORDER — TECHNETIUM TC 99M TETROFOSMIN IV KIT
10.3000 | PACK | Freq: Once | INTRAVENOUS | Status: AC | PRN
  Administered 2015-08-26: 10 via INTRAVENOUS
  Filled 2015-08-26: qty 10

## 2015-08-26 MED ORDER — TECHNETIUM TC 99M TETROFOSMIN IV KIT
32.0000 | PACK | Freq: Once | INTRAVENOUS | Status: AC | PRN
  Administered 2015-08-26: 32 via INTRAVENOUS
  Filled 2015-08-26: qty 32

## 2015-09-02 ENCOUNTER — Telehealth: Payer: Self-pay | Admitting: Cardiovascular Disease

## 2015-09-02 NOTE — Telephone Encounter (Signed)
Returned call to patient and gave her the results. She verbalized understanding.

## 2015-09-02 NOTE — Telephone Encounter (Signed)
New message   Pt is calling for the results of her Myocardial perfusion test

## 2015-09-03 ENCOUNTER — Ambulatory Visit (INDEPENDENT_AMBULATORY_CARE_PROVIDER_SITE_OTHER): Payer: Medicare Other

## 2015-09-03 ENCOUNTER — Ambulatory Visit (INDEPENDENT_AMBULATORY_CARE_PROVIDER_SITE_OTHER): Payer: Medicare Other | Admitting: Podiatry

## 2015-09-03 ENCOUNTER — Encounter: Payer: Self-pay | Admitting: Podiatry

## 2015-09-03 VITALS — BP 126/53 | HR 70 | Resp 16

## 2015-09-03 DIAGNOSIS — M722 Plantar fascial fibromatosis: Secondary | ICD-10-CM

## 2015-09-03 DIAGNOSIS — M79672 Pain in left foot: Secondary | ICD-10-CM

## 2015-09-03 DIAGNOSIS — M2041 Other hammer toe(s) (acquired), right foot: Secondary | ICD-10-CM | POA: Diagnosis not present

## 2015-09-03 DIAGNOSIS — M779 Enthesopathy, unspecified: Secondary | ICD-10-CM

## 2015-09-03 MED ORDER — TRIAMCINOLONE ACETONIDE 10 MG/ML IJ SUSP
10.0000 mg | Freq: Once | INTRAMUSCULAR | Status: AC
  Administered 2015-09-03: 10 mg

## 2015-09-04 NOTE — Progress Notes (Addendum)
Subjective:     Patient ID: Mary Farrell, female   DOB: 1945/02/20, 70 y.o.   MRN: [id number]  HPI patient presents with quite a bit of discomfort in the lateral side of the left foot and also the second metatarsophalangeal joint left with patient having a history of digital deformities right that she's concerned are becoming more painful   Review of Systems     Objective:   Physical Exam Neurovascular status found to be intact muscle strength was adequate range of motion was adequate with patient found to have digital deformities right second and fourth toe with structural malalignment alignment and on the left foot noted to have discomfort around the peroneal insertion fifth metatarsal second metatarsophalangeal joint with 2 separate problems been present    Assessment:     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 Careful injection of the lateral capsular area fifth MPJ and fifth base was accomplished and the second MPJ was also infiltrated with 3 mg dexamethasone 5 mg Xylocaine and 2 mg Kenalog. I then went ahead and discussed hammertoe deformities right and we discussed possible digital procedure to lift the fourth toe right foot    X-ray report indicates that there is digital deformity fourth right and no indication of fracture of the forefoot left or significant arthritic condition

## 2015-09-17 ENCOUNTER — Other Ambulatory Visit: Payer: Self-pay | Admitting: Cardiovascular Disease

## 2015-09-17 NOTE — Telephone Encounter (Signed)
Refill request for xanax Previously refilled by Dr. Mare Ferrari Routed to Dr. Awanda Mink, LPN to approve or defer to PCP

## 2015-09-18 ENCOUNTER — Emergency Department (HOSPITAL_COMMUNITY): Payer: Medicare Other

## 2015-09-18 ENCOUNTER — Telehealth: Payer: Self-pay | Admitting: Cardiovascular Disease

## 2015-09-18 ENCOUNTER — Encounter (HOSPITAL_COMMUNITY): Payer: Self-pay | Admitting: *Deleted

## 2015-09-18 ENCOUNTER — Inpatient Hospital Stay (HOSPITAL_COMMUNITY)
Admission: EM | Admit: 2015-09-18 | Discharge: 2015-09-23 | DRG: 309 | Disposition: A | Discharge status: Home / Self Care | Payer: Medicare Other | Attending: Cardiology | Admitting: Cardiology

## 2015-09-18 DIAGNOSIS — F419 Anxiety disorder, unspecified: Secondary | ICD-10-CM | POA: Diagnosis present

## 2015-09-18 DIAGNOSIS — I959 Hypotension, unspecified: Secondary | ICD-10-CM | POA: Diagnosis present

## 2015-09-18 DIAGNOSIS — I484 Atypical atrial flutter: Secondary | ICD-10-CM | POA: Diagnosis present

## 2015-09-18 DIAGNOSIS — I319 Disease of pericardium, unspecified: Secondary | ICD-10-CM | POA: Diagnosis not present

## 2015-09-18 DIAGNOSIS — R110 Nausea: Secondary | ICD-10-CM | POA: Diagnosis not present

## 2015-09-18 DIAGNOSIS — I10 Essential (primary) hypertension: Secondary | ICD-10-CM | POA: Diagnosis present

## 2015-09-18 DIAGNOSIS — Z7982 Long term (current) use of aspirin: Secondary | ICD-10-CM | POA: Diagnosis not present

## 2015-09-18 DIAGNOSIS — I3139 Other pericardial effusion (noninflammatory): Secondary | ICD-10-CM

## 2015-09-18 DIAGNOSIS — I4892 Unspecified atrial flutter: Secondary | ICD-10-CM

## 2015-09-18 DIAGNOSIS — T462X5A Adverse effect of other antidysrhythmic drugs, initial encounter: Secondary | ICD-10-CM | POA: Diagnosis not present

## 2015-09-18 DIAGNOSIS — K219 Gastro-esophageal reflux disease without esophagitis: Secondary | ICD-10-CM | POA: Diagnosis present

## 2015-09-18 DIAGNOSIS — I080 Rheumatic disorders of both mitral and aortic valves: Secondary | ICD-10-CM | POA: Diagnosis present

## 2015-09-18 DIAGNOSIS — I313 Pericardial effusion (noninflammatory): Secondary | ICD-10-CM | POA: Diagnosis present

## 2015-09-18 DIAGNOSIS — R0600 Dyspnea, unspecified: Secondary | ICD-10-CM

## 2015-09-18 HISTORY — DX: Unspecified osteoarthritis, unspecified site: M19.90

## 2015-09-18 HISTORY — DX: Other pericardial effusion (noninflammatory): I31.39

## 2015-09-18 HISTORY — DX: Chest pain, unspecified: R07.9

## 2015-09-18 HISTORY — DX: Unspecified glaucoma: H40.9

## 2015-09-18 HISTORY — DX: Gastro-esophageal reflux disease without esophagitis: K21.9

## 2015-09-18 HISTORY — DX: Unspecified atrial flutter: I48.92

## 2015-09-18 HISTORY — DX: Pericardial effusion (noninflammatory): I31.3

## 2015-09-18 HISTORY — DX: Diaphragmatic hernia without obstruction or gangrene: K44.9

## 2015-09-18 LAB — CBC
HEMATOCRIT: 45.4 % (ref 36.0–46.0)
HEMOGLOBIN: 15 g/dL (ref 12.0–15.0)
MCH: 32.7 pg (ref 26.0–34.0)
MCHC: 33 g/dL (ref 30.0–36.0)
MCV: 98.9 fL (ref 78.0–100.0)
Platelets: 234 10*3/uL (ref 150–400)
RBC: 4.59 MIL/uL (ref 3.87–5.11)
RDW: 14.1 % (ref 11.5–15.5)
WBC: 8 10*3/uL (ref 4.0–10.5)

## 2015-09-18 LAB — BASIC METABOLIC PANEL
ANION GAP: 7 (ref 5–15)
BUN: 7 mg/dL (ref 6–20)
CHLORIDE: 101 mmol/L (ref 101–111)
CO2: 29 mmol/L (ref 22–32)
Calcium: 9.5 mg/dL (ref 8.9–10.3)
Creatinine, Ser: 0.72 mg/dL (ref 0.44–1.00)
GFR calc non Af Amer: >60 mL/min (ref >60)
Glucose, Bld: 93 mg/dL (ref 65–99)
POTASSIUM: 3.7 mmol/L (ref 3.5–5.1)
SODIUM: 137 mmol/L (ref 135–145)

## 2015-09-18 LAB — URINALYSIS, ROUTINE W REFLEX MICROSCOPIC
Bilirubin Urine: NEGATIVE
GLUCOSE, UA: NEGATIVE mg/dL
KETONES UR: NEGATIVE mg/dL
LEUKOCYTES UA: NEGATIVE
Nitrite: NEGATIVE
PROTEIN: NEGATIVE mg/dL
Specific Gravity, Urine: 1.026 (ref 1.005–1.030)
pH: 7.5 (ref 5.0–8.0)

## 2015-09-18 LAB — PROTIME-INR
INR: 1.04
PROTHROMBIN TIME: 13.6 s (ref 11.4–15.2)

## 2015-09-18 LAB — ECHOCARDIOGRAM LIMITED
Height: 60 in
Weight: 2528 oz

## 2015-09-18 LAB — I-STAT TROPONIN, ED: Troponin i, poc: 0.01 ng/mL (ref 0.00–0.08)

## 2015-09-18 LAB — URINE MICROSCOPIC-ADD ON

## 2015-09-18 LAB — TSH: TSH: 1.56 u[IU]/mL (ref 0.350–4.500)

## 2015-09-18 LAB — T4, FREE: FREE T4: 0.89 ng/dL (ref 0.61–1.12)

## 2015-09-18 MED ORDER — ALPRAZOLAM 0.25 MG PO TABS
0.2500 mg | ORAL_TABLET | Freq: Two times a day (BID) | ORAL | Status: DC | PRN
  Administered 2015-09-18 – 2015-09-19 (×2): 0.25 mg via ORAL
  Filled 2015-09-18 (×2): qty 1

## 2015-09-18 MED ORDER — SODIUM CHLORIDE 0.9% FLUSH: 3.0000 mL | INTRAVENOUS | Status: DC | PRN

## 2015-09-18 MED ORDER — AMIODARONE LOAD VIA INFUSION
150.0000 mg | Freq: Once | INTRAVENOUS | Status: DC
  Filled 2015-09-18: qty 83.34

## 2015-09-18 MED ORDER — AMIODARONE IV BOLUS ONLY 150 MG/100ML: 150.0000 mg | Freq: Once | INTRAVENOUS | Status: DC

## 2015-09-18 MED ORDER — AMIODARONE HCL IN DEXTROSE 360-4.14 MG/200ML-% IV SOLN
30.0000 mg/h | INTRAVENOUS | Status: DC
  Administered 2015-09-19 – 2015-09-22 (×7): 30 mg/h via INTRAVENOUS
  Filled 2015-09-18 (×7): qty 200

## 2015-09-18 MED ORDER — SODIUM CHLORIDE 0.9 % IV SOLN
250.0000 mL | INTRAVENOUS | Status: DC | PRN
  Administered 2015-09-22: 500 mL via INTRAVENOUS

## 2015-09-18 MED ORDER — ACETAMINOPHEN 325 MG PO TABS: 650.0000 mg | ORAL_TABLET | ORAL | Status: DC | PRN

## 2015-09-18 MED ORDER — AMIODARONE HCL IN DEXTROSE 360-4.14 MG/200ML-% IV SOLN: 60.0000 mg/h | INTRAVENOUS | Status: AC

## 2015-09-18 MED ORDER — SODIUM CHLORIDE 0.9% FLUSH
3.0000 mL | Freq: Two times a day (BID) | INTRAVENOUS | Status: DC
  Administered 2015-09-18 – 2015-09-22 (×7): 3 mL via INTRAVENOUS

## 2015-09-18 MED ORDER — ZOLPIDEM TARTRATE 5 MG PO TABS
5.0000 mg | ORAL_TABLET | Freq: Every evening | ORAL | Status: DC | PRN
  Administered 2015-09-18 – 2015-09-22 (×5): 5 mg via ORAL
  Filled 2015-09-18 (×5): qty 1

## 2015-09-18 MED ORDER — IOPAMIDOL (ISOVUE-370) INJECTION 76%
INTRAVENOUS | Status: AC
  Administered 2015-09-18: 100 mL
  Filled 2015-09-18: qty 100

## 2015-09-18 MED ORDER — ONDANSETRON HCL 4 MG/2ML IJ SOLN
4.0000 mg | Freq: Four times a day (QID) | INTRAMUSCULAR | Status: DC | PRN
  Administered 2015-09-19 – 2015-09-22 (×8): 4 mg via INTRAVENOUS
  Filled 2015-09-18 (×8): qty 2

## 2015-09-18 MED ORDER — NITROGLYCERIN 0.4 MG SL SUBL: 0.4000 mg | SUBLINGUAL_TABLET | SUBLINGUAL | Status: DC | PRN

## 2015-09-18 NOTE — H&P (Signed)
History and Physical   Patient ID: Mary Farrell MRN: [id number], DOB/AGE: Nov 20, 1945 70 y.o. Date of Encounter: 09/18/2015  Primary Physician:  Melinda Crutch, MD Primary Cardiologist: Dr Oval Linsey (was Dr B)  Chief Complaint:  Pericardial effusion  HPI: Mary Farrell is a 70 y.o. 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 Seen by [facility] for increasing SOB, echo below, mod peric effusion noted since 2011, prior to then, it was small.   Dizziness since March 2017 has limited her activity. She was diagnosed with vertigo, but her vertigo has improved with physical therapy. She has been felt to be deconditioned and has been exercising, and feels her DOE was improving.  Today, she woke early this am with her heart pounding. She felt it was going faster than usual, and beating very hard. She did not have chest pain, but was SOB and felt weak. She was a little diaphoretic. She came to the ER and was in atypical atrial flutter, rate controlled. A CT was performed because of the SOB and showed her effusion, but no PE. She is SOB at times at rest and with any activity. She still feels her heart pound at times, but not all the time.   She is very clear that she did not feel like this last night and has never felt like this before.   Past Medical History:  Diagnosis Date  . Aortic regurgitation 08/13/2015  . Chest pain    remote cath in 1996 with NORMAL coronaries noted  . Exogenous obesity    history of   . Headache, migraine    history of   . History of cardiovascular stress test 2007   showing no ischemia  . Hypertension   . Mitral stenosis with insufficiency   . Murmur    of mitral stenosis and moderate aortic insufficiency      . Palpitations    occasional  . Personal history of rheumatic heart disease   . SBE (subacute bacterial endocarditis) ([place])    prophylaxis    Surgical History:  Past Surgical History:  Procedure Laterality Date    . CARDIAC CATHETERIZATION  01/13/1995   normal coronary anatomy and mild mitral stenosis and mild pulmonary hypertension     I have reviewed the patient's current medications. Medication Sig  ALPRAZolam (XANAX) 0.25 MG tablet Take 0.25 mg by mouth as needed for anxiety.  aspirin 81 MG tablet Take 81 mg by mouth daily.    B Complex Vitamins (VITAMIN B COMPLEX PO) Take 1 capsule by mouth daily.    Calcium Carbonate (CALCIUM 500 PO) Take 500 mg by mouth daily.    cetirizine (ZYRTEC) 10 MG tablet Take 10 mg by mouth at bedtime.  Cholecalciferol (VITAMIN D) 2000 UNITS CAPS Take 2,000 Units by mouth daily.    conjugated estrogens (PREMARIN) vaginal cream Place 1 Applicatorful vaginally daily.  dorzolamide-timolol (COSOPT) 22.3-6.8 MG/ML ophthalmic solution PLACE 1 DROP INTO EACH EYE TWICE A DAY  gabapentin (NEURONTIN) 300 MG capsule Take 300 mg by mouth 3 (three) times daily.  hydrochlorothiazide (HYDRODIURIL) 12.5 MG tablet Take 1 tablet by mouth  daily  latanoprost (XALATAN) 0.005 % ophthalmic solution Place 1 drop into both eyes daily.  losartan (COZAAR) 100 MG tablet Take 1 tablet by mouth  daily  multivitamin (THERAGRAN) per tablet Take 1 tablet by mouth daily.    nadolol (CORGARD) 20 MG tablet Take 1 tablet by mouth  daily  omeprazole (PRILOSEC) 40 MG capsule Take 40 mg by mouth daily.   psyllium (METAMUCIL) 58.6 % powder Take 1 packet by mouth daily as needed (for constipation).    Scheduled Meds: Continuous Infusions: PRN Meds:.  Allergies: No Known Allergies  Social History   Social History  . Marital status: Single    Spouse name: N/A  . Number of children: N/A  . Years of education: N/A   Occupational History  . Retired    Social History Main Topics  . Smoking status: Never Smoker  . Smokeless tobacco: Never Used  . Alcohol use No  . Drug use: No  . Sexual activity: Not on file   Other Topics Concern  . Not on file   Social History Narrative   Lives with  husband in [place].    Family History  Problem Relation Age of Onset  . Heart attack Mother   . Hypertension Brother   . Kidney disease Brother   . Diabetes Brother   . Heart failure Maternal Grandfather    Family Status  Relation Status  . Mother Deceased at age 38  . Father Deceased at age 30   car accident  . Brother Alive  . Maternal Grandmother Deceased  . Maternal Grandfather Deceased  . Paternal Grandmother Deceased  . Paternal Grandfather Deceased    Review of Systems:   Full 14-point review of systems otherwise negative except as noted above.  Physical Exam: Blood pressure 117/70, pulse 84, temperature 97.9 F (36.6 C), temperature source Oral, resp. rate 17, height 5' (1.524 m), weight 158 lb (71.7 kg), SpO2 99 %. General: Well developed, well nourished,female in no acute distress. Head: Normocephalic, atraumatic, sclera non-icteric, no xanthomas, nares are without discharge. Dentition: good Neck: No carotid bruits. JVD not elevated. No thyromegally Lungs: Good expansion bilaterally. without wheezes or rhonchi.  Heart: slightly IRRegular rate and rhythm with S1 S2.  Loud S3, no S4.  No murmur, no rubs appreciated. Abdomen: Soft, non-tender, non-distended with normoactive bowel sounds. No hepatomegaly. No rebound/guarding. No obvious abdominal masses. Msk:  Strength and tone appear normal for age. No joint deformities or effusions, no spine or costo-vertebral angle tenderness. Extremities: No clubbing or cyanosis. No edema.  Distal pedal pulses are 2+ in 4 extrem Neuro: Alert and oriented X 3. Moves all extremities spontaneously. No focal deficits noted. Psych:  Responds to questions appropriately with a normal affect. Skin: No rashes or lesions noted  Labs:   Lab Results  Component Value Date   WBC 8.0 09/18/2015   HGB 15.0 09/18/2015   HCT 45.4 09/18/2015   MCV 98.9 09/18/2015   PLT 234 09/18/2015     Recent Labs Lab 09/18/15 1144  NA 137  K 3.7    CL 101  CO2 29  BUN 7  CREATININE 0.72  CALCIUM 9.5  GLUCOSE 93    Recent Labs  09/18/15 1159  TROPIPOC 0.01   Radiology/Studies: Dg Chest 2 View Result Date: 09/18/2015 CLINICAL DATA:  Heart palpitations. Shortness of breath and dizziness. EXAM: CHEST  2 VIEW COMPARISON:  12/20/2012 FINDINGS: The cardiac silhouette is moderately enlarged. Mediastinal contours appear intact. There is no evidence of focal airspace consolidation, pleural effusion or pneumothorax. Minimal atelectatic changes in the lingula. Osseous structures are without acute abnormality. Soft tissues are grossly normal. IMPRESSION: Enlarged cardiac silhouette. Minimal atelectatic changes versus scarring in the lingula. Electronically Signed   By: Fidela Salisbury M.D.   On: 09/18/2015 11:59   Ct Angio  Chest Pe W Or Wo Contrast Result Date: 09/18/2015 CLINICAL DATA:  Palpitations, shortness of Breath EXAM: CT ANGIOGRAPHY CHEST WITH CONTRAST TECHNIQUE: Multidetector CT imaging of the chest was performed using the standard protocol during bolus administration of intravenous contrast. Multiplanar CT image reconstructions and MIPs were obtained to evaluate the vascular anatomy. CONTRAST:  100 cc Isovue COMPARISON:  None. FINDINGS: Images of the thoracic inlet are unremarkable. No mediastinal hematoma or adenopathy. The study is of excellent technical quality. No pulmonary embolus is noted. There is moderate pericardial effusion measures 1.4 cm thickness posteriorly. No anterior pericardial effusion. No hilar adenopathy is noted. Images of the lung parenchyma shows no pulmonary edema. Mild geographic ground-glass parenchymal attenuation most likely due to atelectasis. There is atelectasis or infiltrate in left lower lobe posteriorly retrocardiac. Central airways are patent. No destructive rib lesions are noted. The patient is status postcholecystectomy. No adrenal gland mass is noted. Review of the MIP images confirms the above  findings. IMPRESSION: 1. No pulmonary embolus is noted. 2. No mediastinal hematoma or adenopathy. 3. There is moderate size pericardial effusion measures 1.4 cm in thickness posteriorly. 4. There is atelectasis or infiltrate in left lower lobe posteriorly retrocardiac. 5. No pulmonary edema.  Mild atelectasis bilaterally. Electronically Signed   By: Lahoma Crocker M.D.   On: 09/18/2015 15:21   Echo: 05/23/2015 - Left ventricle: The cavity size was normal. Wall thickness was   increased in a pattern of mild LVH. Systolic function was normal.   The estimated ejection fraction was in the range of 55% to 60%.   Wall motion was normal; there were no regional wall motion   abnormalities. Ratio of mitral valve peak E velocity to average   of medial and lateral annulus peak E velocity: 46.85.   Longitudinal strain, TDI:- 18 %. - Aortic valve: There was moderate regurgitation. - Mitral valve: Moderately thickened annulus. The findings are   consistent with mild to moderate stenosis. There was mild   regurgitation. Mean gradient (D): 5 mm Hg. Peak gradient (D): 14   mm Hg. Valve area by pressure half-time: 1.33 cm^2. Valve area by   continuity equation (using LVOT flow): 0.94 cm^2. - Left atrium: The atrium was severely dilated. - Right ventricle: The cavity size was mildly decreased. - Pericardium, extracardiac: A moderate pericardial effusion was   identified circumferential to the heart. There was no evidence of   hemodynamic compromise.  ECG: 08/24 Atrial flutter, ?atypical with variable "P"-R interval, ?AV dissociation  ASSESSMENT AND PLAN:  Principal Problem:   Atrial flutter with controlled response ([place]) - she is tolerating this poorly, duration < 24 hours - BP too low for IV Cardizem, she is on Nadolol - need to pursue SR, consider DCCV in am - CHADS2VASC=3 (age x 1, female, HTN) - anticoagulation indicated, heparin for now as effusion may need tap  Active Problems:   Pericardial  effusion - may part of her dyspnea - cardiac silhouette mildly enlarged now, which is was not in 2014 - MD eval echo and advise if tap needed    Dyspnea - multifactorial with effusion, atrial flutter and deconditioning all possible contributers - follow    Mitral stenosis and aortic insufficiency - not severe on last echo, reassess  Augusto Garbe 09/18/2015 5:01 PM Beeper [id number]  The patient was seen, examined and discussed with [name], PA-C and I agree with the above.    Mary Farrell is a 70 y.o. female with a history of  nl cors 1996, mod AI/MS by echo, HTN, rheumatic fever as a child. She has been followed by Dr. Oval Linsey for increasing shortness of breath , she was found to have pericardial effusion that has been moderate and unchanged since 2011.  The patient underwent nuclear stress testing 2 weeks ago that was negative for ischemia. She presented today with acute onset palpitations that she woke up with that are associated with shortness of breath and dizziness. On presentation to the ER she was found to be in narrow complex irregular rhythm what appears like atrial flutter with variable block. It seems that her a flutter has been on for less than 24 hours however we will start heparin drip, We will start amiodarone drip in order to cardiovert. We will order an echocardiogram to reevaluate for degree of pericardial effusion, etiology is unknown, previously ultimately immune diseases were considered but old workup has been negative. Her TSH is normal.  She doesn't appear to be in fluid overload.  Ena Dawley 09/18/2015

## 2015-09-18 NOTE — Progress Notes (Signed)
  Echocardiogram 2D Echocardiogram has been performed.  Mary Farrell 09/18/2015, 4:55 PM

## 2015-09-18 NOTE — Progress Notes (Signed)
Patient lying in bed. Is somewhat anxious, stated that it started after talking with her children. Xanax given, call light within reach.

## 2015-09-18 NOTE — Progress Notes (Signed)
Patient arrived to the floor. VSS. NO CP. NO SOB. Patient alert and oriented. Oriented to room and unit. Tele monitor placed. CCMD notified. Personal belongings at bedside.   Domingo Dimes RN

## 2015-09-18 NOTE — Telephone Encounter (Signed)
This needs to be filled by her PCP. 

## 2015-09-18 NOTE — ED Triage Notes (Signed)
Pt states palpitations, sob and dizziness since last night.  Feels as if something is stuck in her throat.  Airway patent.  HR 90 in triage.

## 2015-09-18 NOTE — Progress Notes (Signed)
ANTICOAGULATION CONSULT NOTE - Initial Consult  Pharmacy Consult for heparin Indication: atrial fibrillation  No Known Allergies  Patient Measurements: Height: 5' (152.4 cm) Weight: 158 lb (71.7 kg) IBW/kg (Calculated) : 45.5 Heparin Dosing Weight: ~60 kg  Vital Signs: Temp: 97.9 F (36.6 C) (08/24 1124) Temp Source: Oral (08/24 1124) BP: 139/74 (08/24 1851) Pulse Rate: 94 (08/24 1851)  Labs:  Recent Labs  09/18/15 1144  HGB 15.0  HCT 45.4  PLT 234  CREATININE 0.72    Estimated Creatinine Clearance: 57 mL/min (by C-G formula based on SCr of 0.8 mg/dL).   Medical History: Past Medical History:  Diagnosis Date  . Aortic regurgitation 08/13/2015  . Chest pain    remote cath in 1996 with NORMAL coronaries noted  . Exogenous obesity    history of   . Headache, migraine    history of   . History of cardiovascular stress test 2007   showing no ischemia  . Hypertension   . Mitral stenosis with insufficiency   . Murmur    of mitral stenosis and moderate aortic insufficiency      . Palpitations    occasional  . Personal history of rheumatic heart disease   . SBE (subacute bacterial endocarditis) ([place])    prophylaxis    Medications:  Prescriptions Prior to Admission  Medication Sig Dispense Refill Last Dose  . ALPRAZolam (XANAX) 0.25 MG tablet Take 0.25 mg by mouth as needed for anxiety.   Past Week at Unknown time  . aspirin 81 MG tablet Take 81 mg by mouth daily.     09/17/2015 at Unknown time  . B Complex Vitamins (VITAMIN B COMPLEX PO) Take 1 capsule by mouth daily.     09/17/2015 at Unknown time  . Calcium Carbonate (CALCIUM 500 PO) Take 500 mg by mouth daily.     09/17/2015 at Unknown time  . cetirizine (ZYRTEC) 10 MG tablet Take 10 mg by mouth at bedtime.   09/17/2015 at Unknown time  . Cholecalciferol (VITAMIN D) 2000 UNITS CAPS Take 2,000 Units by mouth daily.     09/17/2015 at Unknown time  . conjugated estrogens (PREMARIN) vaginal cream Place 1  Applicatorful vaginally daily.   09/17/2015 at Unknown time  . dorzolamide-timolol (COSOPT) 22.3-6.8 MG/ML ophthalmic solution PLACE 1 DROP INTO EACH EYE TWICE A DAY  6 09/18/2015 at Unknown time  . gabapentin (NEURONTIN) 300 MG capsule Take 300 mg by mouth 3 (three) times daily.   09/18/2015 at Unknown time  . hydrochlorothiazide (HYDRODIURIL) 12.5 MG tablet Take 1 tablet by mouth  daily 90 tablet 3 09/18/2015 at Unknown time  . latanoprost (XALATAN) 0.005 % ophthalmic solution Place 1 drop into both eyes daily.  6 09/17/2015 at Unknown time  . losartan (COZAAR) 100 MG tablet Take 1 tablet by mouth  daily 90 tablet 3 09/17/2015 at Unknown time  . multivitamin (THERAGRAN) per tablet Take 1 tablet by mouth daily.     09/18/2015 at Unknown time  . nadolol (CORGARD) 20 MG tablet Take 1 tablet by mouth  daily 90 tablet 3 09/17/2015 at Unknown time  . omeprazole (PRILOSEC) 40 MG capsule Take 40 mg by mouth daily.    09/18/2015 at Unknown time  . psyllium (METAMUCIL) 58.6 % powder Take 1 packet by mouth daily as needed (for constipation).    PRN    Assessment: 70 yo female admitted with new onset aflutter.  Pharmacy asked to begin IV heparin.  Spoke to patient, no hx bleeding or anticoagulants  PTA.  Awaiting ECHO to see if pericardial effusion requires tap prior to consideration of oral anticoagulation.  Goal of Therapy:  Heparin level 0.3-0.7 units/ml Monitor platelets by anticoagulation protocol: Yes   Plan:  1. Start IV heparin with bolus of 3000 units x 1. 2. Then start heparin drip at 750 units/hr. 3. Check heparin level 6 hrs after drip starts. 4. Daily heparin level and CBC. 5. F/u plans for oral anticoagulation as able.  Uvaldo Rising, BCPS  Clinical Pharmacist Pager [phone]  09/18/2015 7:19 PM

## 2015-09-18 NOTE — Telephone Encounter (Signed)
Spoke with pt, she woke this morning with her heart beating hard and feeling SOB. She reports her heart is not beating right and is fast. She has no way of checking her bp or pulse. She also reports SOB just sitting in the chair. She reports she feels the hard heart beats under her left arm and side. She takes her medications at bedtime. Denies cp, dizziness or feeling faint. Will forward for dr [place]'s review and advise.

## 2015-09-18 NOTE — Telephone Encounter (Signed)
New message    Pt called and says that her heart is feeling differently. NO chest pain, but SOB. Please call.     Pt c/o Shortness Of Breath: STAT if SOB developed within the last 24 hours or pt is noticeably SOB on the phone  1. Are you currently SOB (can you hear that pt is SOB on the phone)? yes  2. How long have you been experiencing SOB?last night and this morning  3. Are you SOB when sitting or when up moving around? Mostly when she is sitting  4. Are you currently experiencing any other symptoms? anxious

## 2015-09-18 NOTE — ED Notes (Signed)
Echocardiogram in progress at bedside with patient and family.

## 2015-09-18 NOTE — Telephone Encounter (Signed)
Spoke with pt, aware of dr [place]'s recommendation. She has found a bp cuff and her bp is 114/73 with pulse 96. She is also reporting dizziness. She will have someone take her to the er. Hospital team made aware.

## 2015-09-18 NOTE — ED Notes (Signed)
Phlebotomy notified unable to get blood.

## 2015-09-18 NOTE — Telephone Encounter (Signed)
She needs to be seen in the ED.

## 2015-09-19 LAB — COMPREHENSIVE METABOLIC PANEL
ALBUMIN: 4 g/dL (ref 3.5–5.0)
ALK PHOS: 60 U/L (ref 38–126)
ALT: 24 U/L (ref 14–54)
ANION GAP: 8 (ref 5–15)
AST: 20 U/L (ref 15–41)
BILIRUBIN TOTAL: 0.6 mg/dL (ref 0.3–1.2)
BUN: 9 mg/dL (ref 6–20)
CALCIUM: 9.4 mg/dL (ref 8.9–10.3)
CO2: 30 mmol/L (ref 22–32)
CREATININE: 0.81 mg/dL (ref 0.44–1.00)
Chloride: 99 mmol/L — ABNORMAL LOW (ref 101–111)
GFR calc Af Amer: >60 mL/min (ref >60)
GFR calc non Af Amer: >60 mL/min (ref >60)
GLUCOSE: 106 mg/dL — AB (ref 65–99)
Potassium: 4.1 mmol/L (ref 3.5–5.1)
SODIUM: 137 mmol/L (ref 135–145)
TOTAL PROTEIN: 6.6 g/dL (ref 6.5–8.1)

## 2015-09-19 LAB — HEPARIN LEVEL (UNFRACTIONATED): Heparin Unfractionated: 0.37 IU/mL (ref 0.30–0.70)

## 2015-09-19 MED ORDER — MAGNESIUM HYDROXIDE 400 MG/5ML PO SUSP
30.0000 mL | Freq: Every day | ORAL | Status: DC | PRN
  Administered 2015-09-19: 30 mL via ORAL
  Filled 2015-09-19: qty 30

## 2015-09-19 MED ORDER — HEPARIN (PORCINE) IN NACL 100-0.45 UNIT/ML-% IJ SOLN
750.0000 [IU]/h | INTRAMUSCULAR | Status: DC
  Administered 2015-09-19: 750 [IU]/h via INTRAVENOUS
  Filled 2015-09-19: qty 250

## 2015-09-19 MED ORDER — HEPARIN BOLUS VIA INFUSION
3000.0000 [IU] | Freq: Once | INTRAVENOUS | Status: AC
  Administered 2015-09-19: 3000 [IU] via INTRAVENOUS
  Filled 2015-09-19: qty 3000

## 2015-09-19 NOTE — Progress Notes (Signed)
Hospital Problem List     Principal Problem:   Atrial flutter with controlled response ([place]) Active Problems:   Mitral stenosis and aortic insufficiency   Dyspnea   Pericardial effusion    Patient Profile:   Primary Cardiologist: Dr. Oval Linsey  70 y.o. 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   Subjective   Palpitations and breathing overall improved, but still "feels funny".   Inpatient Medications    . amiodarone  150 mg Intravenous Once  . sodium chloride flush  3 mL Intravenous Q12H    Vital Signs    Vitals:   09/18/15 1815 09/18/15 1851 09/18/15 2049 09/19/15 0511  BP: 113/57 139/74 132/80 108/60  Pulse: 91 94 89 92  Resp: 16 17 18 16   Temp:   97.5 F (36.4 C) 97.6 F (36.4 C)  TempSrc:   Oral Oral  SpO2: 98% 99% 98% 97%  Weight:    157 lb (71.2 kg)  Height:       No intake or output data in the 24 hours ending 09/19/15 1220 Filed Weights   09/18/15 1125 09/19/15 0511  Weight: 158 lb (71.7 kg) 157 lb (71.2 kg)    Physical Exam    General: Well developed, well nourished, female appearing in no acute distress. Head: Normocephalic, atraumatic.  Neck: Supple without bruits, JVD not elevated. Lungs:  Resp regular and unlabored, CTA without wheezing or rales. Heart: Irregularly irregular, S1, S2, no S3, S4, or murmur; no rub. Abdomen: Soft, non-tender, non-distended with normoactive bowel sounds. No hepatomegaly. No rebound/guarding. No obvious abdominal masses. Extremities: No clubbing, cyanosis, or edema. Distal pedal pulses are 2+ bilaterally. Neuro: Alert and oriented X 3. Moves all extremities spontaneously. Psych: Normal affect.  Labs    CBC  Recent Labs  09/18/15 1144  WBC 8.0  HGB 15.0  HCT 45.4  MCV 98.9  PLT [id number]   Basic Metabolic Panel  Recent Labs  09/18/15 1144 09/19/15 0207  NA 137 137  K 3.7  4.1  CL 101 99*  CO2 29 30  GLUCOSE 93 106*  BUN 7 9  CREATININE 0.72 0.81  CALCIUM 9.5 9.4   Liver Function Tests  Recent Labs  09/19/15 0207  AST 20  ALT 24  ALKPHOS 60  BILITOT 0.6  PROT 6.6  ALBUMIN 4.0   Thyroid Function Tests  Recent Labs  09/18/15 1439  TSH 1.560    Telemetry    Atrial flutter, HR in 80's to low-100's.   ECG    Atrial flutter, HR 85.    Cardiac Studies and Radiology    Dg Chest 2 View  Result Date: 09/18/2015 CLINICAL DATA:  Heart palpitations. Shortness of breath and dizziness. EXAM: CHEST  2 VIEW COMPARISON:  12/20/2012 FINDINGS: The cardiac silhouette is moderately enlarged. Mediastinal contours appear intact. There is no evidence of focal airspace consolidation, pleural effusion or pneumothorax. Minimal atelectatic changes in the lingula. Osseous structures are without acute abnormality. Soft tissues are grossly normal. IMPRESSION: Enlarged cardiac silhouette. Minimal atelectatic changes versus scarring in the lingula. Electronically Signed   By: Fidela Salisbury M.D.   On: 09/18/2015 11:59   Ct Angio Chest Pe W Or Wo Contrast  Result Date: 09/18/2015 CLINICAL DATA:  Palpitations, shortness of Breath EXAM: CT ANGIOGRAPHY CHEST WITH CONTRAST TECHNIQUE: Multidetector CT imaging of the chest was performed using the  standard protocol during bolus administration of intravenous contrast. Multiplanar CT image reconstructions and MIPs were obtained to evaluate the vascular anatomy. CONTRAST:  100 cc Isovue COMPARISON:  None. FINDINGS: Images of the thoracic inlet are unremarkable. No mediastinal hematoma or adenopathy. The study is of excellent technical quality. No pulmonary embolus is noted. There is moderate pericardial effusion measures 1.4 cm thickness posteriorly. No anterior pericardial effusion. No hilar adenopathy is noted. Images of the lung parenchyma shows no pulmonary edema. Mild geographic ground-glass parenchymal attenuation most  likely due to atelectasis. There is atelectasis or infiltrate in left lower lobe posteriorly retrocardiac. Central airways are patent. No destructive rib lesions are noted. The patient is status postcholecystectomy. No adrenal gland mass is noted. Review of the MIP images confirms the above findings. IMPRESSION: 1. No pulmonary embolus is noted. 2. No mediastinal hematoma or adenopathy. 3. There is moderate size pericardial effusion measures 1.4 cm in thickness posteriorly. 4. There is atelectasis or infiltrate in left lower lobe posteriorly retrocardiac. 5. No pulmonary edema.  Mild atelectasis bilaterally. Electronically Signed   By: Lahoma Crocker M.D.   On: 09/18/2015 15:21    Echocardiogram: 09/18/2015 Study Conclusions  - Left ventricle: The cavity size was normal. Wall thickness was   increased in a pattern of mild LVH. Systolic function was normal.   The estimated ejection fraction was in the range of 55% to 60%. - Aortic valve: There was mild regurgitation. - Mitral valve: MV is thickened, calcified with restricted motion.   Doppler interrogation was not done on valve to evaluate. Order   limited echo if indicated. Calcified annulus. - Left atrium: The atrium was severely dilated. - Pericardium, extracardiac: Moderate to large pericardial effusion   surrounds heart (12-18 mm) There is no signs of tamponade by echo   criteria. Clinical correlation indidated. Appears slightly more   prominent when compared to echo of April 2017.  Impressions:  - Limited echo Doppler interrogations of valves not done.  Assessment & Plan    1. Atrial flutter with controlled response ([name]) - presented with worsening dyspnea and palpitations, found to be in new onset atrial flutter. Was hypotensive and unable to tolerate Cardizem. HR in 80's - 90's but still reports feeling palpitations.  - low-risk NST on 08/26/2015. TSH and electrolytes within normal limits. Will check Mg.  - started on IV Amiodarone,  still remains in atrial flutter. Questionable p-waves at times on telemetry but EKG shows this is most consistent with atrial flutter. Would continue with IV Amiodarone load today as this did not start until 0200 this AM. No available spots for TEE/DCCV until Monday. Hopefully she will convert prior to then.   - This patients CHA2DS2-VASc Score and unadjusted Ischemic Stroke Rate (% per year) is equal to 3.2 % stroke rate/year from a score of 3 (Age, Female, HTN). Currently on Heparin. Would switch to NOAC once confirmed no procedures will be needed.   2. Pericardial effusion - perhaps contributing to her dyspnea.  - pericardial effusion was moderate back in 04/2015, by echo this admission it is moderate to large (12-66mm). No evidence of tamponade. Will ask Dr. Meda Coffee to review the images and compare. If large, may need a pericardiocentesis.   3. Dyspnea - multifactorial with effusion, atrial flutter and deconditioning all possibly contributing.   4. Mitral stenosis and aortic insufficiency - mild to moderate MS and moderate AR noted on echo in 04/2015. Doppler interrogations of valves noted performed on echo this admission.   Signed,  Erma Heritage , PA-C 12:20 PM 09/19/2015 Pager: [phone]  The patient was seen, examined and discussed with Bernerd Pho, PA-C and I agree with the above.    Mary Farrell is a 70 y.o. 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 She has been followed by Dr. Oval Linsey for increasing shortness of breath , she was found to have pericardial effusion that has been moderate and unchanged since 2011.  The patient underwent nuclear stress testing 2 weeks ago that was negative for ischemia. She presented today with acute onset palpitations that she woke up with that are associated with shortness of breath and dizziness. On presentation to the ER she was found to be in atrial flutter with variable block. It  seems that her a flutter has been on for less than 24 hours however we will started heparin drip, and amiodarone drip in order to cardiovert. Her ventricular rate is better controlled but she remains in atrial flutter. There is no availability for DCCV today, we will plan for Monday. An echocardiogram to reevaluate for degree of pericardial effusion was unchanged. Her TSH is normal.  She doesn't appear to be in fluid overload. She states that she felt warm/cold, however no leukocytosis and no fever. CXR showed no pneumonia.  Ena Dawley 09/19/2015

## 2015-09-19 NOTE — Progress Notes (Addendum)
Paged the card fellow. Patient had order for an amiodarone bolus along with continuous drip earlier today that was not done. My order started late due to other issues on the floor.Current orders are for it to run at 16.67. He stated that it was ok to just let it run at the present rate w/o the bolus or past rate. Patient has been asymptomatic with no chest pain so far tonight

## 2015-09-19 NOTE — Progress Notes (Signed)
[place] for heparin Indication: atrial fibrillation  No Known Allergies  Patient Measurements: Height: 5' (152.4 cm) Weight: 157 lb (71.2 kg) IBW/kg (Calculated) : 45.5 Heparin Dosing Weight: ~60 kg  Vital Signs: Temp: 98 F (36.7 C) (08/25 2000) Temp Source: Oral (08/25 2000) BP: 105/54 (08/25 2000) Pulse Rate: 92 (08/25 2000)  Labs:  Recent Labs  09/18/15 1144 09/18/15 1939 09/19/15 0207 09/19/15 1849  HGB 15.0  --   --   --   HCT 45.4  --   --   --   PLT 234  --   --   --   LABPROT  --  13.6  --   --   INR  --  1.04  --   --   HEPARINUNFRC  --   --   --  0.37  CREATININE 0.72  --  0.81  --     Estimated Creatinine Clearance: 56.1 mL/min (by C-G formula based on SCr of 0.81 mg/dL).   Assessment: 70 yo female admitted with new onset aflutter.  Pharmacy dosing heparin and the initial  Level is at goal. Plans noted for DCCV on Monday  Goal of Therapy:  Heparin level 0.3-0.7 units/ml Monitor platelets by anticoagulation protocol: Yes   Plan:  No heparin changes needed Daily heparin level and CBC. F/u plans for oral anticoagulation as able.  Hildred Laser, Pharm D 09/19/2015 8:52 PM

## 2015-09-19 NOTE — Progress Notes (Signed)
Chaplain presented to the patient to provide spiritual care support. Chaplain provided ministry of presence for the patient and her husband as she shared She is somewhat anxious at this time wondering what the next step will be to resolve her medical needs. Chaplain offered a prayer of comfort and peace, as the staff works to find medical plan of care for her.Chaplain will follow up as needed. [place] [phone]

## 2015-09-20 LAB — CBC
HCT: 43.9 % (ref 36.0–46.0)
Hemoglobin: 14.4 g/dL (ref 12.0–15.0)
MCH: 32.7 pg (ref 26.0–34.0)
MCHC: 32.8 g/dL (ref 30.0–36.0)
MCV: 99.5 fL (ref 78.0–100.0)
Platelets: 177 10*3/uL (ref 150–400)
RBC: 4.41 MIL/uL (ref 3.87–5.11)
RDW: 14 % (ref 11.5–15.5)
WBC: 7.8 10*3/uL (ref 4.0–10.5)

## 2015-09-20 LAB — MAGNESIUM: Magnesium: 2.3 mg/dL (ref 1.7–2.4)

## 2015-09-20 LAB — HEPARIN LEVEL (UNFRACTIONATED): Heparin Unfractionated: 0.38 IU/mL (ref 0.30–0.70)

## 2015-09-20 MED ORDER — APIXABAN 5 MG PO TABS
5.0000 mg | ORAL_TABLET | Freq: Two times a day (BID) | ORAL | Status: DC
  Administered 2015-09-20 – 2015-09-23 (×7): 5 mg via ORAL
  Filled 2015-09-20 (×7): qty 1

## 2015-09-20 MED ORDER — ALPRAZOLAM 0.25 MG PO TABS
0.2500 mg | ORAL_TABLET | Freq: Three times a day (TID) | ORAL | Status: DC | PRN
  Administered 2015-09-20 – 2015-09-22 (×6): 0.25 mg via ORAL
  Filled 2015-09-20 (×6): qty 1

## 2015-09-20 NOTE — Progress Notes (Signed)
ANTICOAGULATION CONSULT NOTE - Follow Up Consult  Pharmacy Consult for Apixaban Indication: atrial flutter  No Known Allergies  Patient Measurements: Height: 5' (152.4 cm) Weight: 161 lb 1.6 oz (73.1 kg) IBW/kg (Calculated) : 45.5  Vital Signs: Temp: 97.7 F (36.5 C) (08/26 0538) Temp Source: Oral (08/26 0538) BP: 112/49 (08/26 0538) Pulse Rate: 85 (08/26 0538)  Labs:  Recent Labs  09/18/15 1144 09/18/15 1939 09/19/15 0207 09/19/15 1849 09/20/15 0413 09/20/15 0415  HGB 15.0  --   --   --  14.4  --   HCT 45.4  --   --   --  43.9  --   PLT 234  --   --   --  177  --   LABPROT  --  13.6  --   --   --   --   INR  --  1.04  --   --   --   --   HEPARINUNFRC  --   --   --  0.37  --  0.38  CREATININE 0.72  --  0.81  --   --   --     Estimated Creatinine Clearance: 56.8 mL/min (by C-G formula based on SCr of 0.81 mg/dL).  Assessment: 69yof continues on heparin for new onset atrial flutter. Heparin level is therapeutic 0.38. Plan to switch to apixaban today. DCCV on Monday. CBC stable.  Goal of Therapy:  Monitor platelets by anticoagulation protocol: Yes   Plan:  1) Apixaban 5mg  bid 2) Case management consult 3) Will educate  Deboraha Sprang 09/20/2015,10:41 AM

## 2015-09-20 NOTE — Discharge Instructions (Signed)
Information on my medicine - ELIQUIS (apixaban)  This medication education was reviewed with me or my healthcare representative as part of my discharge preparation.  The pharmacist that spoke with me during my hospital stay was:  Deboraha Sprang, [facility]  Why was Eliquis prescribed for you? Eliquis was prescribed for you to reduce the risk of forming blood clots that can cause a stroke if you have a medical condition called atrial fibrillation (a type of irregular heartbeat) OR to reduce the risk of a blood clots forming after orthopedic surgery.  What do You need to know about Eliquis ? Take your Eliquis TWICE DAILY - one tablet in the morning and one tablet in the evening with or without food.  It would be best to take the doses about the same time each day.  If you have difficulty swallowing the tablet whole please discuss with your pharmacist how to take the medication safely.  Take Eliquis exactly as prescribed by your doctor and DO NOT stop taking Eliquis without talking to the doctor who prescribed the medication.  Stopping may increase your risk of developing a new clot or stroke.  Refill your prescription before you run out.  After discharge, you should have regular check-up appointments with your healthcare provider that is prescribing your Eliquis.  In the future your dose may need to be changed if your kidney function or weight changes by a significant amount or as you get older.  What do you do if you miss a dose? If you miss a dose, take it as soon as you remember on the same day and resume taking twice daily.  Do not take more than one dose of ELIQUIS at the same time.  Important Safety Information A possible side effect of Eliquis is bleeding. You should call your healthcare provider right away if you experience any of the following: ? Bleeding from an injury or your nose that does not stop. ? Unusual colored urine (red or dark brown) or unusual colored stools (red or  black). ? Unusual bruising for unknown reasons. ? A serious fall or if you hit your head (even if there is no bleeding).  Some medicines may interact with Eliquis and might increase your risk of bleeding or clotting while on Eliquis. To help avoid this, consult your healthcare provider or pharmacist prior to using any new prescription or non-prescription medications, including herbals, vitamins, non-steroidal anti-inflammatory drugs (NSAIDs) and supplements.  This website has more information on Eliquis (apixaban): [email or web address].

## 2015-09-20 NOTE — Progress Notes (Signed)
    SUBJECTIVE:  She is anxious and nauseated.  No acute SOB.  No chest pain.   PHYSICAL EXAM Vitals:   09/19/15 0511 09/19/15 1229 09/19/15 2000 09/20/15 0538  BP: 108/60 115/65 (!) 105/54 (!) 112/49  Pulse: 92 97 92 85  Resp: 16 17 17 17   Temp: 97.6 F (36.4 C) 98.3 F (36.8 C) 98 F (36.7 C) 97.7 F (36.5 C)  TempSrc: Oral Oral Oral Oral  SpO2: 97% 97% 97% 95%  Weight: 157 lb (71.2 kg)   161 lb 1.6 oz (73.1 kg)  Height:       General:  No distress Lungs:  Clear Heart:  Regular Abdomen:  Positive bowel sounds, no rebound no guarding\ Extremities:  No edema   LABS: No results found for: TROPONINI Results for orders placed or performed during the hospital encounter of 09/18/15 (from the past 24 hour(s))  Heparin level (unfractionated)     Status: None   Collection Time: 09/19/15  6:49 PM  Result Value Ref Range   Heparin Unfractionated 0.37 0.30 - 0.70 IU/mL  CBC     Status: None   Collection Time: 09/20/15  4:13 AM  Result Value Ref Range   WBC 7.8 4.0 - 10.5 K/uL   RBC 4.41 3.87 - 5.11 MIL/uL   Hemoglobin 14.4 12.0 - 15.0 g/dL   HCT 43.9 36.0 - 46.0 %   MCV 99.5 78.0 - 100.0 fL   MCH 32.7 26.0 - 34.0 pg   MCHC 32.8 30.0 - 36.0 g/dL   RDW 14.0 11.5 - 15.5 %   Platelets 177 150 - 400 K/uL  Magnesium     Status: None   Collection Time: 09/20/15  4:13 AM  Result Value Ref Range   Magnesium 2.3 1.7 - 2.4 mg/dL  Heparin level (unfractionated)     Status: None   Collection Time: 09/20/15  4:15 AM  Result Value Ref Range   Heparin Unfractionated 0.38 0.30 - 0.70 IU/mL    Intake/Output Summary (Last 24 hours) at 09/20/15 0950 Last data filed at 09/20/15 0300  Gross per 24 hour  Intake           538.57 ml  Output              500 ml  Net            38.57 ml    TELE:   Continued atrial flutter.   EKG:   Atrial flutter.  Rate 95.  No acute ST T wave changes.  09/20/2015  ASSESSMENT AND PLAN:  ATRIAL FLUTTER:  Still in flutter.   Plan cardioversion on Monday.   I will switch to Eliquis.   MITRAL STENOSIS:  Mild.  Follow.    PERICARDIAL EFFUSION:   This is moderate and unchanged.  No plan for pericardiocentesis at this time.    DYSPNEA:  This has been a chronic issue.  I am going to check a BNP level.    Minus Breeding 09/20/2015 9:50 AM

## 2015-09-21 LAB — BRAIN NATRIURETIC PEPTIDE: B NATRIURETIC PEPTIDE 5: 256.2 pg/mL — AB (ref 0.0–100.0)

## 2015-09-21 MED ORDER — PANTOPRAZOLE SODIUM 40 MG PO TBEC
80.0000 mg | DELAYED_RELEASE_TABLET | Freq: Every day | ORAL | Status: DC
  Administered 2015-09-21 – 2015-09-23 (×3): 80 mg via ORAL
  Filled 2015-09-21 (×3): qty 2

## 2015-09-21 NOTE — Progress Notes (Signed)
    SUBJECTIVE:  Still  anxious and nauseated.  No acute SOB.  No chest pain.   PHYSICAL EXAM Vitals:   09/19/15 2000 09/20/15 0538 09/20/15 1925 09/21/15 0523  BP: (!) 105/54 (!) 112/49 (!) 111/56 (!) 146/68  Pulse: 92 85 95 (!) 103  Resp: 17 17 20  (!) 22  Temp: 98 F (36.7 C) 97.7 F (36.5 C) 98.4 F (36.9 C) 98.5 F (36.9 C)  TempSrc: Oral Oral Oral Oral  SpO2: 97% 95% 97% 98%  Weight:  161 lb 1.6 oz (73.1 kg)  157 lb 8 oz (71.4 kg)  Height:       General:  No distress Lungs:  Clear Heart:  Regular Abdomen:  Positive bowel sounds, no rebound no guarding\ Extremities:  No edema   LABS: No results found for: TROPONINI No results found for this or any previous visit (from the past 24 hour(s)).  Intake/Output Summary (Last 24 hours) at 09/21/15 0804 Last data filed at 09/20/15 2344  Gross per 24 hour  Intake              720 ml  Output              901 ml  Net             -181 ml    TELE:   Continued atrial flutter.  09/21/2015    ASSESSMENT AND PLAN:  ATRIAL FLUTTER:  Still in flutter.   Plan cardioversion on Monday. (This is not yet on the schedule.  I will keep her NPO.  Orders will need to be written when we know the date/time and performing MD.)  I switched to Eliquis.  She does not need TEE as this was new onset flutter and she has been anticoagulated since a few hours after the onset.   MITRAL STENOSIS:  Mild.  Follow.    PERICARDIAL EFFUSION:   This is moderate and unchanged.  No plan for pericardiocentesis at this time.    DYSPNEA:  This has been a chronic issue.  I am going to check a BNP level.    Jeneen Rinks [facility] 09/21/2015 8:04 AM

## 2015-09-22 ENCOUNTER — Encounter (HOSPITAL_COMMUNITY)
Admission: EM | Disposition: A | Discharge status: Home / Self Care | Payer: Self-pay | Source: Home / Self Care | Attending: Cardiology

## 2015-09-22 ENCOUNTER — Encounter (HOSPITAL_COMMUNITY): Payer: Self-pay | Admitting: Certified Registered Nurse Anesthetist

## 2015-09-22 ENCOUNTER — Inpatient Hospital Stay (HOSPITAL_COMMUNITY): Payer: Medicare Other | Admitting: Certified Registered Nurse Anesthetist

## 2015-09-22 DIAGNOSIS — I313 Pericardial effusion (noninflammatory): Secondary | ICD-10-CM

## 2015-09-22 HISTORY — PX: CARDIOVERSION: SHX1299

## 2015-09-22 SURGERY — CARDIOVERSION
Anesthesia: Monitor Anesthesia Care

## 2015-09-22 MED ORDER — LIDOCAINE 2% (20 MG/ML) 5 ML SYRINGE
INTRAMUSCULAR | Status: DC | PRN
  Administered 2015-09-22: 100 mg via INTRAVENOUS

## 2015-09-22 MED ORDER — METOPROLOL SUCCINATE ER 25 MG PO TB24
25.0000 mg | ORAL_TABLET | Freq: Every day | ORAL | Status: DC
  Administered 2015-09-22 – 2015-09-23 (×2): 25 mg via ORAL
  Filled 2015-09-22 (×2): qty 1

## 2015-09-22 MED ORDER — PROPOFOL 10 MG/ML IV BOLUS
INTRAVENOUS | Status: DC | PRN
  Administered 2015-09-22: 80 mg via INTRAVENOUS

## 2015-09-22 NOTE — Progress Notes (Signed)
Per [employer] on Eliquis S/W [facility] @ [employer] # [phone]   1. ELIQUIS  5 MG BID  (30 )   COVER- YES  CO-PAY- $ 35.00  60 TAB  TIER- 2 DRUG  PRIOR APPROVAL - NO  PHARMACY : [facility], [facility] AND [facility]

## 2015-09-22 NOTE — Transfer of Care (Signed)
Immediate Anesthesia Transfer of Care Note  Patient: MCKYNLEY NITZEL  Procedure(s) Performed: Procedure(s): CARDIOVERSION (N/A)  Patient Location: Endoscopy Unit  Anesthesia Type:MAC  Level of Consciousness: awake, alert  and patient cooperative  Airway & Oxygen Therapy: Patient Spontanous Breathing  Post-op Assessment: Report given to RN and Post -op Vital signs reviewed and stable  Post vital signs: Reviewed and stable  Last Vitals:  Vitals:   09/22/15 1016 09/22/15 1017  BP: (!) 103/56   Pulse: 65 67  Resp: 16 15  Temp:      Last Pain:  Vitals:   09/22/15 0845  TempSrc:   PainSc: 0-No pain         Complications: No apparent anesthesia complications

## 2015-09-22 NOTE — H&P (View-Only) (Signed)
    SUBJECTIVE:  Still  anxious and nauseated.  No acute SOB.  No chest pain.   PHYSICAL EXAM Vitals:   09/19/15 2000 09/20/15 0538 09/20/15 1925 09/21/15 0523  BP: (!) 105/54 (!) 112/49 (!) 111/56 (!) 146/68  Pulse: 92 85 95 (!) 103  Resp: 17 17 20  (!) 22  Temp: 98 F (36.7 C) 97.7 F (36.5 C) 98.4 F (36.9 C) 98.5 F (36.9 C)  TempSrc: Oral Oral Oral Oral  SpO2: 97% 95% 97% 98%  Weight:  161 lb 1.6 oz (73.1 kg)  157 lb 8 oz (71.4 kg)  Height:       General:  No distress Lungs:  Clear Heart:  Regular Abdomen:  Positive bowel sounds, no rebound no guarding\ Extremities:  No edema   LABS: No results found for: TROPONINI No results found for this or any previous visit (from the past 24 hour(s)).  Intake/Output Summary (Last 24 hours) at 09/21/15 0804 Last data filed at 09/20/15 2344  Gross per 24 hour  Intake              720 ml  Output              901 ml  Net             -181 ml    TELE:   Continued atrial flutter.  09/21/2015    ASSESSMENT AND PLAN:  ATRIAL FLUTTER:  Still in flutter.   Plan cardioversion on Monday. (This is not yet on the schedule.  I will keep her NPO.  Orders will need to be written when we know the date/time and performing MD.)  I switched to Eliquis.  She does not need TEE as this was new onset flutter and she has been anticoagulated since a few hours after the onset.   MITRAL STENOSIS:  Mild.  Follow.    PERICARDIAL EFFUSION:   This is moderate and unchanged.  No plan for pericardiocentesis at this time.    DYSPNEA:  This has been a chronic issue.  I am going to check a BNP level.    Jeneen Rinks [facility] 09/21/2015 8:04 AM

## 2015-09-22 NOTE — Anesthesia Postprocedure Evaluation (Signed)
Anesthesia Post Note  Patient: Mary Farrell  Procedure(s) Performed: Procedure(s) (LRB): CARDIOVERSION (N/A)  Patient location during evaluation: PACU Anesthesia Type: General Level of consciousness: awake and alert Pain management: pain level controlled Vital Signs Assessment: post-procedure vital signs reviewed and stable Respiratory status: spontaneous breathing, nonlabored ventilation, respiratory function stable and patient connected to nasal cannula oxygen Cardiovascular status: blood pressure returned to baseline and stable Postop Assessment: no signs of nausea or vomiting Anesthetic complications: no    Last Vitals:  Vitals:   09/22/15 1017 09/22/15 1020  BP:  130/61  Pulse: 67 82  Resp: 15 17  Temp:  36.8 C    Last Pain:  Vitals:   09/22/15 1020  TempSrc: Oral  PainSc:                  [name] DAVID

## 2015-09-22 NOTE — CV Procedure (Signed)
    Electrical Cardioversion Procedure Note Mary Farrell [id number] [birth year]  Procedure: Electrical Cardioversion Indications:  Atrial Fibrillation  Time Out: Verified patient identification, verified procedure,medications/allergies/relevent history reviewed, required imaging and test results available.  Performed  Procedure Details  The patient was NPO after midnight. Anesthesia was administered at the beside  by Dr. Conrad Farrell with propofol.  Cardioversion was performed with synchronized biphasic defibrillation via AP pads with 120, 150 joules.  2 attempt(s) were performed.  The patient converted to normal sinus rhythm. The patient tolerated the procedure well   IMPRESSION:  Successful cardioversion of atrial fibrillation. On amiodarone IV. Discussed with family.   Mary Farrell 09/22/2015, 10:23 AM

## 2015-09-22 NOTE — Progress Notes (Signed)
Patient Name: Mary Farrell Date of Encounter: 09/22/2015  Principal Problem:   Atrial flutter with controlled response [facility]) Active Problems:   Mitral stenosis and aortic insufficiency   Dyspnea   Pericardial effusion   Primary Cardiologist: Dr. Oval Linsey Patient Profile: 70 year old female with a past medical history of moderate AI/MS, HTN, rheumatic fever as a child, and moderate pericardial effusion that is unchanged since 2011. She presented on 09/18/15 with palpitations, SOB and dizziness. Found to be in atrial flutter.   SUBJECTIVE: Feels well, still slightly anxious. Denies palpitations and SOB.   OBJECTIVE Vitals:   09/22/15 1015 09/22/15 1016 09/22/15 1017 09/22/15 1020  BP:  (!) 103/56  130/61  Pulse: 67 65 67 82  Resp: 17 16 15 17   Temp:    98.3 F (36.8 C)  TempSrc:    Oral  SpO2: 100% 100% 100% 96%  Weight:      Height:        Intake/Output Summary (Last 24 hours) at 09/22/15 1112 Last data filed at 09/22/15 1020  Gross per 24 hour  Intake              630 ml  Output              800 ml  Net             -170 ml   Filed Weights   09/20/15 0538 09/21/15 0523 09/22/15 0406  Weight: 161 lb 1.6 oz (73.1 kg) 157 lb 8 oz (71.4 kg) 158 lb 3.2 oz (71.8 kg)    PHYSICAL EXAM General: Well developed, well nourished, female in no acute distress. Head: Normocephalic, atraumatic.  Neck: Supple without bruits,no JVD. Lungs:  Resp regular and unlabored, CTA. Heart: RRR, S1, S2, no S3, S4, or murmur; no rub. Abdomen: Soft, non-tender, non-distended, BS + x 4.  Extremities: No clubbing, cyanosis, no edema.  Neuro: Alert and oriented X 3. Moves all extremities spontaneously. Psych: Normal affect.  LABS: CBC: Recent Labs  09/20/15 0413  WBC 7.8  HGB 14.4  HCT 43.9  MCV 99.5  PLT 123XX123   Basic Metabolic Panel: Recent Labs  09/20/15 0413  MG 2.3   BNP:  B Natriuretic Peptide  Date/Time Value Ref Range Status  09/21/2015 08:16 AM 256.2 (H) 0.0 - 100.0  pg/mL Final    Current Facility-Administered Medications:  .  0.9 %  sodium chloride infusion, 250 mL, Intravenous, PRN, Evelene Croon Barrett, PA-C, Last Rate: 10 mL/hr at 09/22/15 0946, 500 mL at 09/22/15 0946 .  acetaminophen (TYLENOL) tablet 650 mg, 650 mg, Oral, Q4H PRN, Rhonda G Barrett, PA-C .  ALPRAZolam Duanne Moron) tablet 0.25 mg, 0.25 mg, Oral, TID PRN, Minus Breeding, MD, 0.25 mg at 09/21/15 2143 .  amiodarone (NEXTERONE) 1.8 mg/mL load via infusion 150 mg, 150 mg, Intravenous, Once **FOLLOWED BY** [EXPIRED] amiodarone (NEXTERONE PREMIX) 360-4.14 MG/200ML-% (1.8 mg/mL) IV infusion, 60 mg/hr, Intravenous, Continuous **FOLLOWED BY** amiodarone (NEXTERONE PREMIX) 360-4.14 MG/200ML-% (1.8 mg/mL) IV infusion, 30 mg/hr, Intravenous, Continuous, Rhonda G Barrett, PA-C, Last Rate: 16.7 mL/hr at 09/22/15 1007, 30 mg/hr at 09/22/15 1007 .  apixaban (ELIQUIS) tablet 5 mg, 5 mg, Oral, BID, Otilio Miu, RPH, 5 mg at 09/22/15 0901 .  magnesium hydroxide (MILK OF MAGNESIA) suspension 30 mL, 30 mL, Oral, Daily PRN, Dorothy Spark, MD, 30 mL at 09/19/15 2208 .  nitroGLYCERIN (NITROSTAT) SL tablet 0.4 mg, 0.4 mg, Sublingual, Q5 Min x 3 PRN, Rhonda G Barrett, PA-C .  ondansetron (ZOFRAN) injection 4 mg, 4 mg, Intravenous, Q6H PRN, Rhonda G Barrett, PA-C, 4 mg at 09/22/15 0817 .  pantoprazole (PROTONIX) EC tablet 80 mg, 80 mg, Oral, Daily, Dorothy Spark, MD, 80 mg at 09/22/15 0901 .  sodium chloride flush (NS) 0.9 % injection 3 mL, 3 mL, Intravenous, Q12H, Rhonda G Barrett, PA-C, 3 mL at 09/22/15 1000 .  sodium chloride flush (NS) 0.9 % injection 3 mL, 3 mL, Intravenous, PRN, Rhonda G Barrett, PA-C .  zolpidem (AMBIEN) tablet 5 mg, 5 mg, Oral, QHS PRN, Evelene Croon Barrett, PA-C, 5 mg at 09/21/15 2143 . amiodarone 30 mg/hr (09/22/15 1007)   TELE:    NSR     Echo 09/18/15 Study Conclusions  - Left ventricle: The cavity size was normal. Wall thickness was   increased in a pattern of mild LVH. Systolic  function was normal.   The estimated ejection fraction was in the range of 55% to 60%. - Aortic valve: There was mild regurgitation. - Mitral valve: MV is thickened, calcified with restricted motion.   Doppler interrogation was not done on valve to evaluate. Order   limited echo if indicated. Calcified annulus. - Left atrium: The atrium was severely dilated. - Pericardium, extracardiac: Moderate to large pericardial effusion   surrounds heart (12-18 mm) There is no signs of tamponade by echo   criteria. Clinical correlation indidated. Appears slightly more   prominent when compared to echo of April 2017.  Impressions:  - Limited echo Doppler interrogations of valves not done.   Current Medications:  . amiodarone  150 mg Intravenous Once  . apixaban  5 mg Oral BID  . pantoprazole  80 mg Oral Daily  . sodium chloride flush  3 mL Intravenous Q12H   . amiodarone 30 mg/hr (09/22/15 1007)    ASSESSMENT AND PLAN: Principal Problem:   Atrial flutter with controlled response (HCC) Active Problems:   Mitral stenosis and aortic insufficiency   Dyspnea   Pericardial effusion  1. Atrial flutter with rapid ventricular response: Presented with symptomatic Aflutter. She was started on Amiodarone gtt, as she was hypotensive and not a candidate for diltiazem. She had DCCV this morning with restoration of NSR. She will continue on apixaban.   This patients CHA2DS2-VASc Score and unadjusted Ischemic Stroke Rate (% per year) is equal to 3.2 % stroke rate/year from a score of 3 Above score calculated as 1 point each if present [CHF, HTN, DM, Vascular=MI/PAD/Aortic Plaque, Age if 65-74, or Female], 2 points each if present [Age > 75, or Stroke/TIA/TE]  TSH normal. Echo shows normal EF. Still on IV Amiodarone and feeling very nauseous with no appetite. We can stop Amiodarone and start on beta blocker - Metoprolol 25mg  BID.   2. Pericardial effusion: Chronic, unchanged from previous. No rub heard  on exam.   3. Dyspnea: Likely related to her Aflutter. Not dyspneic this am. BNP is 256.2. Had non ischemic Myoview in July 2017. She was having SOB with exertion and mild chest discomfort.   Signed, Arbutus Leas , NP 11:12 AM 09/22/2015 Pager [phone]  Patient seen, examined. Available data reviewed. Agree with findings, assessment, and plan as outlined by Jettie Booze, NP. The patient is independently interviewed and examined. She is tearful on exam. Admits to a lot of anxiety. Complaining of continued nausea ever since starting on amiodarone. Lungs are clear. Heart is regular rate and rhythm. Abdomen is soft and nontender. There is no peripheral edema. Telemetry reviewed. She is now  in sinus rhythm after cardioversion. Will stop amiodarone because of persistent nausea. Change beta blocker from nadolol to metoprolol. Observe until tomorrow morning and consider discharge tomorrow if stable. Continue anticoagulation.  Sherren Mocha, M.D. 09/22/2015 12:16 PM

## 2015-09-22 NOTE — Interval H&P Note (Signed)
History and Physical Interval Note:  09/22/2015 10:05 AM  Mary Farrell  has presented today for surgery, with the diagnosis of atrial fib  The various methods of treatment have been discussed with the patient and family. After consideration of risks, benefits and other options for treatment, the patient has consented to  Procedure(s): CARDIOVERSION (N/A) as a surgical intervention .  The patient's history has been reviewed, patient examined, no change in status, stable for surgery.  I have reviewed the patient's chart and labs.  Questions were answered to the patient's satisfaction.     [employer]

## 2015-09-22 NOTE — Anesthesia Preprocedure Evaluation (Signed)
Anesthesia Evaluation  Patient identified by MRN, date of birth, ID band Patient awake    Reviewed: Allergy & Precautions, NPO status , Patient's Chart, lab work & pertinent test results  Airway Mallampati: I  TM Distance: >3 FB Neck ROM: Full    Dental   Pulmonary    Pulmonary exam normal        Cardiovascular hypertension, Pt. on medications Normal cardiovascular exam+ Valvular Problems/Murmurs AI      Neuro/Psych    GI/Hepatic GERD  Medicated and Controlled,  Endo/Other    Renal/GU      Musculoskeletal   Abdominal   Peds  Hematology   Anesthesia Other Findings   Reproductive/Obstetrics                             Anesthesia Physical Anesthesia Plan  ASA: III  Anesthesia Plan: General   Post-op Pain Management:    Induction: Intravenous  Airway Management Planned: Mask  Additional Equipment:   Intra-op Plan:   Post-operative Plan:   Informed Consent: I have reviewed the patients History and Physical, chart, labs and discussed the procedure including the risks, benefits and alternatives for the proposed anesthesia with the patient or authorized representative who has indicated his/her understanding and acceptance.     Plan Discussed with: CRNA and Surgeon  Anesthesia Plan Comments:         Anesthesia Quick Evaluation

## 2015-09-22 NOTE — Care Management Note (Addendum)
Case Management Note Marvetta Gibbons RN, BSN Unit 2W-Case Manager [phone]  Patient Details  Name: Mary Farrell MRN: [id number] Date of Birth: 1945/04/18  Subjective/Objective:    Pt admitted with afib                Action/Plan: PTA pt lived at home- anticipate return home- referral received for Eliquis- per insurance check- [facility] @ [place] RX # [id number]. ELIQUIS  5 MG BID  (30 )   COVER- YES  CO-PAY- $ 35.00  60 TAB  TIER- 2 DRUG  PRIOR APPROVAL - NO  [facility] : [facility]  Spoke with pt at bedside- insurance coverage shared- 30 day free card given to pt to use on discharge- per pt [facility] -[phone]- will call prior to discharge to check availability of drug in stock.   Expected Discharge Date:   09/23/15               Expected Discharge Plan:  Home/Self Care  In-House Referral:     Discharge planning Services  CM Consult, Medication Assistance  Post Acute Care Choice:    Choice offered to:     DME Arranged:    DME Agency:     HH Arranged:    HH Agency:     Status of Service:  In process, will continue to follow  If discussed at Long Length of Stay Meetings, dates discussed:    Additional Comments:  09/23/15- [name], CM- [facility] [address]- per pharmacy staff they do have Eliquis in stock to fill today. No further CM needs noted   Dahlia Client Romeo Rabon, RN 09/22/2015, 3:02 PM

## 2015-09-23 ENCOUNTER — Encounter (HOSPITAL_COMMUNITY): Payer: Self-pay | Admitting: Cardiology

## 2015-09-23 MED ORDER — APIXABAN 5 MG PO TABS: 5.0000 mg | ORAL_TABLET | Freq: Two times a day (BID) | ORAL | 0 refills | Status: DC

## 2015-09-23 MED ORDER — ONDANSETRON HCL 4 MG PO TABS: 4.0000 mg | ORAL_TABLET | Freq: Three times a day (TID) | ORAL | 0 refills | Status: DC | PRN

## 2015-09-23 MED ORDER — METOPROLOL SUCCINATE ER 25 MG PO TB24: 25.0000 mg | ORAL_TABLET | Freq: Every day | ORAL | 5 refills | Status: DC

## 2015-09-23 MED ORDER — APIXABAN 5 MG PO TABS
5.0000 mg | ORAL_TABLET | Freq: Two times a day (BID) | ORAL | 10 refills | Status: DC
Start: 2015-09-23 — End: 2015-10-20

## 2015-09-23 NOTE — Progress Notes (Signed)
Patient Profile: 70 year old female with a past medical history of moderate AI/MS, HTN, rheumatic fever as a child, and moderate pericardial effusion that is unchanged since 2011. She presented on 09/18/15 with palpitations, SOB and dizziness. Found to be in atrial flutter.   Subjective: Somewhat improved. No palpitations. No dyspnea. Appetite has increased but still nauseated from amiodarone.  She is able to keep down her meds and small meals.   Objective: Vital signs in last 24 hours: Temp:  [97.7 F (36.5 C)-98.4 F (36.9 C)] 97.7 F (36.5 C) (08/29 0312) Pulse Rate:  [41-131] 70 (08/29 0312) Resp:  [12-24] 17 (08/29 0312) BP: (94-162)/(48-97) 115/48 (08/29 0312) SpO2:  [96 %-100 %] 96 % (08/29 0312) Weight:  [159 lb (72.1 kg)] 159 lb (72.1 kg) (08/29 0312) Last BM Date: 09/18/15  Intake/Output from previous day: 08/28 0701 - 08/29 0700 In: 870 [P.O.:720; I.V.:150] Out: 650 [Urine:650] Intake/Output this shift: No intake/output data recorded.  Medications Current Facility-Administered Medications  Medication Dose Route Frequency Provider Last Rate Last Dose  . 0.9 %  sodium chloride infusion  250 mL Intravenous PRN Evelene Croon Barrett, PA-C 10 mL/hr at 09/22/15 0946 500 mL at 09/22/15 0946  . acetaminophen (TYLENOL) tablet 650 mg  650 mg Oral Q4H PRN Rhonda G Barrett, PA-C      . ALPRAZolam Duanne Moron) tablet 0.25 mg  0.25 mg Oral TID PRN Minus Breeding, MD   0.25 mg at 09/22/15 2247  . amiodarone (NEXTERONE) 1.8 mg/mL load via infusion 150 mg  150 mg Intravenous Once Rhonda G Barrett, PA-C      . apixaban (ELIQUIS) tablet 5 mg  5 mg Oral BID Otilio Miu, RPH   5 mg at 09/22/15 2244  . magnesium hydroxide (MILK OF MAGNESIA) suspension 30 mL  30 mL Oral Daily PRN Dorothy Spark, MD   30 mL at 09/19/15 2208  . metoprolol succinate (TOPROL-XL) 24 hr tablet 25 mg  25 mg Oral Daily Sherren Mocha, MD   25 mg at 09/22/15 1307  . nitroGLYCERIN (NITROSTAT) SL tablet 0.4 mg  0.4  mg Sublingual Q5 Min x 3 PRN Rhonda G Barrett, PA-C      . ondansetron (ZOFRAN) injection 4 mg  4 mg Intravenous Q6H PRN Evelene Croon Barrett, PA-C   4 mg at 09/22/15 2036  . pantoprazole (PROTONIX) EC tablet 80 mg  80 mg Oral Daily Dorothy Spark, MD   80 mg at 09/22/15 0901  . sodium chloride flush (NS) 0.9 % injection 3 mL  3 mL Intravenous Q12H Rhonda G Barrett, PA-C   3 mL at 09/22/15 2200  . sodium chloride flush (NS) 0.9 % injection 3 mL  3 mL Intravenous PRN Rhonda G Barrett, PA-C      . zolpidem (AMBIEN) tablet 5 mg  5 mg Oral QHS PRN Evelene Croon Barrett, PA-C   5 mg at 09/22/15 2244    PE: General appearance: alert, cooperative and no distress Neck: no carotid bruit and no JVD Lungs: clear to auscultation bilaterally Heart: regular rate and rhythm, S1, S2 normal, no murmur, click, rub or gallop Extremities: no LEE Pulses: 2+ and symmetric Skin: warm and dry Neurologic: Grossly normal   Assessment/Plan  Principal Problem:   Atrial flutter with controlled response (HCC) Active Problems:   Mitral stenosis and aortic insufficiency   Dyspnea   Pericardial effusion    1. Atrial Flutter with RVR: Presented with symptomatic Aflutter. TSH normal. Echo with normal LVEF. She  was started on Amiodarone gtt, as she was hypotensive and not a candidate for diltiazem. She had DCCV 8/28 with restoration of NSR. Amiodarone was discontinued due to intolerance from nausea. Now on BB, metoprolol XL 25 mg daily. She is tolerating this well. Remains in NSR on telemetry. HR is controlled in the 70s. BP is stable.    She will continue on apixaban for a/c.   This patients CHA2DS2-VASc Score and unadjusted Ischemic Stroke Rate (% per year) is equal to 3.2 % stroke rate/year from a score of 3 Above score calculated as 1 point each if present [CHF, HTN, DM, Vascular=MI/PAD/Aortic Plaque, Age if 65-74, or Female], 2 points each if present [Age > 75, or Stroke/TIA/TE]   2. Pericardial effusion: Chronic,  unchanged from previous. No rub heard on exam.   3. HTN: was previously on losartan as an outpatient. Now requiring BB therapy for PAF. BP is soft but stable. Will continue metoprolol and will hold ARB at time of discharge. Will reassess BP at time of hospital f/u.    Dispo: likely d/c home today after MD sees.    LOS: 5 days   Brittainy M. Ladoris Gene 09/23/2015 9:06 AM   Patient seen, examined. Available data reviewed. Agree with findings, assessment, and plan as outlined by Lyda Jester, PA-C. Patient in NAD, lungs clear, heart RRR without murmur, abdomen soft, NT, extremities with trace edema. Tele shows sinus rhythm. Plan DC home today on Toprol XL, apixaban. Will arrange close FU. Nausea is improving off of amiodarone.   Sherren Mocha, M.D. 09/23/2015 12:29 PM

## 2015-09-23 NOTE — Care Management Important Message (Signed)
Important Message  Patient Details  Name: Mary Farrell MRN: [id number] Date of Birth: 30-Aug-1945   Medicare Important Message Given:  Yes    Nathen May 09/23/2015, 9:14 AM

## 2015-09-23 NOTE — Discharge Summary (Signed)
Discharge Summary    Patient ID: Mary Farrell,  MRN: [id number], DOB/AGE: 06-15-1945 70 y.o.  Admit date: 09/18/2015 Discharge date: 09/23/2015  Primary Care Provider:  Melinda Crutch Primary Cardiologist: Dr. Oval Linsey  Discharge Diagnoses    Principal Problem:   Atrial flutter with controlled response [facility]) Active Problems:   Mitral stenosis and aortic insufficiency   Dyspnea   Pericardial effusion   Allergies No Known Allergies  Diagnostic Studies/Procedures    2D Echo 09/18/15 Study Conclusions  - Left ventricle: The cavity size was normal. Wall thickness was   increased in a pattern of mild LVH. Systolic function was normal.   The estimated ejection fraction was in the range of 55% to 60%. - Aortic valve: There was mild regurgitation. - Mitral valve: MV is thickened, calcified with restricted motion.   Doppler interrogation was not done on valve to evaluate. Order   limited echo if indicated. Calcified annulus. - Left atrium: The atrium was severely dilated. - Pericardium, extracardiac: Moderate to large pericardial effusion   surrounds heart (12-18 mm) There is no signs of tamponade by echo   criteria. Clinical correlation indidated. Appears slightly more   prominent when compared to echo of April 2017.  Impressions:  - Limited echo Doppler interrogations of valves not done.   History of Present Illness     70 year old female with a past medical history of moderate AI/MS, HTN, rheumatic fever as a child, and moderate pericardial effusion that is unchanged since 2011. She presented on 09/18/15 with palpitations, SOB and dizziness. Found to be in new onset atrial flutter.   Hospital Course     Patient was admitted to telemetry for symptomatic Aflutter. TSH was normal. Electrolytes normal. POC troponin was negative. Echo with normal LVEF of 55-60%. Her pericardial effusion was chronic and unchanged from prior echo. No rub on exam. She was started on Amiodarone  gtt, as she was hypotensive and not a candidate for diltiazem. She was started on Eliquis, 5 mg BID. She had DCCV 8/28 with restoration of NSR. Amiodarone was discontinued due to intolerance from nausea. She was placed on a BB, metoprolol XL 25 mg daily. She tolerated this well and remained in NSR on telemetry. HR was controlled in the 70s. BP remained stable (losartan and HCTZ both held on d/c to prevent hypotension). Her nausea gradually improved after discontinuation of amiodarone. She was given Rx for PRN Zofran as well. No refills. She was last seen and examined by Dr. Burt Knack who determined she was stable for d/c. 2 week post hospital f/u has been arranged with [name], PA-C.    Consultants: none     Discharge Vitals Blood pressure (!) 115/48, pulse 70, temperature 97.7 F (36.5 C), temperature source Oral, resp. rate 17, height 5' (1.524 m), weight 159 lb (72.1 kg), SpO2 96 %.  Filed Weights   09/21/15 0523 09/22/15 0406 09/23/15 0312  Weight: 157 lb 8 oz (71.4 kg) 158 lb 3.2 oz (71.8 kg) 159 lb (72.1 kg)    Labs & Radiologic Studies    CBC No results for input(s): WBC, NEUTROABS, HGB, HCT, MCV, PLT in the last 72 hours. Basic Metabolic Panel No results for input(s): NA, K, CL, CO2, GLUCOSE, BUN, CREATININE, CALCIUM, MG, PHOS in the last 72 hours. Liver Function Tests No results for input(s): AST, ALT, ALKPHOS, BILITOT, PROT, ALBUMIN in the last 72 hours. No results for input(s): LIPASE, AMYLASE in the last 72 hours. Cardiac Enzymes No  results for input(s): CKTOTAL, CKMB, CKMBINDEX, TROPONINI in the last 72 hours. BNP Invalid input(s): POCBNP D-Dimer No results for input(s): DDIMER in the last 72 hours. Hemoglobin A1C No results for input(s): HGBA1C in the last 72 hours. Fasting Lipid Panel No results for input(s): CHOL, HDL, LDLCALC, TRIG, CHOLHDL, LDLDIRECT in the last 72 hours. Thyroid Function Tests No results for input(s): TSH, T4TOTAL, T3FREE, THYROIDAB in the  last 72 hours.  Invalid input(s): FREET3 _____________  Dg Chest 2 View  Result Date: 09/18/2015 CLINICAL DATA:  Heart palpitations. Shortness of breath and dizziness. EXAM: CHEST  2 VIEW COMPARISON:  12/20/2012 FINDINGS: The cardiac silhouette is moderately enlarged. Mediastinal contours appear intact. There is no evidence of focal airspace consolidation, pleural effusion or pneumothorax. Minimal atelectatic changes in the lingula. Osseous structures are without acute abnormality. Soft tissues are grossly normal. IMPRESSION: Enlarged cardiac silhouette. Minimal atelectatic changes versus scarring in the lingula. Electronically Signed   By: Fidela Salisbury M.D.   On: 09/18/2015 11:59   Ct Angio Chest Pe W Or Wo Contrast  Result Date: 09/18/2015 CLINICAL DATA:  Palpitations, shortness of Breath EXAM: CT ANGIOGRAPHY CHEST WITH CONTRAST TECHNIQUE: Multidetector CT imaging of the chest was performed using the standard protocol during bolus administration of intravenous contrast. Multiplanar CT image reconstructions and MIPs were obtained to evaluate the vascular anatomy. CONTRAST:  100 cc Isovue COMPARISON:  None. FINDINGS: Images of the thoracic inlet are unremarkable. No mediastinal hematoma or adenopathy. The study is of excellent technical quality. No pulmonary embolus is noted. There is moderate pericardial effusion measures 1.4 cm thickness posteriorly. No anterior pericardial effusion. No hilar adenopathy is noted. Images of the lung parenchyma shows no pulmonary edema. Mild geographic ground-glass parenchymal attenuation most likely due to atelectasis. There is atelectasis or infiltrate in left lower lobe posteriorly retrocardiac. Central airways are patent. No destructive rib lesions are noted. The patient is status postcholecystectomy. No adrenal gland mass is noted. Review of the MIP images confirms the above findings. IMPRESSION: 1. No pulmonary embolus is noted. 2. No mediastinal hematoma or  adenopathy. 3. There is moderate size pericardial effusion measures 1.4 cm in thickness posteriorly. 4. There is atelectasis or infiltrate in left lower lobe posteriorly retrocardiac. 5. No pulmonary edema.  Mild atelectasis bilaterally. Electronically Signed   By: Lahoma Crocker M.D.   On: 09/18/2015 15:21   Disposition   Pt is being discharged home today in good condition.  Follow-up Plans & Appointments    Follow-up Information    Barrett, Suanne Marker, PA-C Follow up on 10/06/2015.   Specialties:  Cardiology, Radiology Why:  10:00 AM Dr. Blenda Mounts PA Contact information: [address] [place] [address] [phone]          Discharge Instructions    Diet - low sodium heart healthy    Complete by:  As directed   Increase activity slowly    Complete by:  As directed      Discharge Medications   Current Discharge Medication List    START taking these medications   Details  !! apixaban (ELIQUIS) 5 MG TABS tablet Take 1 tablet (5 mg total) by mouth 2 (two) times daily. Qty: 60 tablet, Refills: 10    !! apixaban (ELIQUIS) 5 MG TABS tablet Take 1 tablet (5 mg total) by mouth 2 (two) times daily. Qty: 60 tablet, Refills: 0    metoprolol succinate (TOPROL-XL) 25 MG 24 hr tablet Take 1 tablet (25 mg total) by mouth daily. Qty: 30  tablet, Refills: 5    ondansetron (ZOFRAN) 4 MG tablet Take 1 tablet (4 mg total) by mouth every 8 (eight) hours as needed for nausea or vomiting. Qty: 20 tablet, Refills: 0     !! - Potential duplicate medications found. Please discuss with provider.    CONTINUE these medications which have NOT CHANGED   Details  ALPRAZolam (XANAX) 0.25 MG tablet Take 0.25 mg by mouth as needed for anxiety.    B Complex Vitamins (VITAMIN B COMPLEX PO) Take 1 capsule by mouth daily.      Calcium Carbonate (CALCIUM 500 PO) Take 500 mg by mouth daily.      cetirizine (ZYRTEC) 10 MG tablet Take 10 mg by mouth at bedtime.    Cholecalciferol (VITAMIN D)  2000 UNITS CAPS Take 2,000 Units by mouth daily.      conjugated estrogens (PREMARIN) vaginal cream Place 1 Applicatorful vaginally daily.    dorzolamide-timolol (COSOPT) 22.3-6.8 MG/ML ophthalmic solution PLACE 1 DROP INTO EACH EYE TWICE A DAY Refills: 6    gabapentin (NEURONTIN) 300 MG capsule Take 300 mg by mouth 3 (three) times daily.    latanoprost (XALATAN) 0.005 % ophthalmic solution Place 1 drop into both eyes daily. Refills: 6    multivitamin (THERAGRAN) per tablet Take 1 tablet by mouth daily.      omeprazole (PRILOSEC) 40 MG capsule Take 40 mg by mouth daily.     psyllium (METAMUCIL) 58.6 % powder Take 1 packet by mouth daily as needed (for constipation).       STOP taking these medications     aspirin 81 MG tablet      hydrochlorothiazide (HYDRODIURIL) 12.5 MG tablet      losartan (COZAAR) 100 MG tablet      nadolol (CORGARD) 20 MG tablet           Outstanding Labs/Studies   None   Duration of Discharge Encounter   Greater than 30 minutes including physician time.  Signed, Lyda Jester PA-C 09/23/2015, 1:03 PM

## 2015-09-23 NOTE — Progress Notes (Addendum)
Discussed with the patient and husband and all questioned fully answered. She will call me if any problems arise. All prescriptions sent to [facility] - pt and husband aware. Telemetry removed, CCMD notified. IVs removed. Educated patient on follow up appointments, new medications (Zofran, Eliquis, and Metoprolol). Pt understood to stop previous Rx. Pt verbalized that she had the Eliquis assistance from CM.  Fritz Pickerel, RN

## 2015-09-24 NOTE — ED Provider Notes (Signed)
[facility] DEPT Provider Note   CSN: [id number] Arrival date & time: 09/18/15  1114     History   Chief Complaint Chief Complaint  Patient presents with  . Palpitations  . Shortness of Breath    HPI Mary Farrell is a 70 y.o. female.  HPI Patient presents with acute onset of palpitations and shortness of breath. States she felt her heart was beating very strongly. She denied chest pain or pressure at any point. Denied cough with sputum production. No fever or chills. Patient states she has been increasingly short of breath especially with exertion over the last few days. Denies any recent extended travel or immobilization. Past Medical History:  Diagnosis Date  . Aortic regurgitation 08/13/2015  . Arthritis    "hands, wrists, back, feet, toes" (09/18/2015)  . Chest pain    remote cath in 1996 with NORMAL coronaries noted  . Exogenous obesity    history of   . GERD (gastroesophageal reflux disease)   . Glaucoma, both eyes   . Headache, migraine    "stopped in my 50's" (09/18/2015)  . History of cardiovascular stress test 2007   showing no ischemia  . Hypertension   . Mitral stenosis with insufficiency   . Murmur    of mitral stenosis and moderate aortic insufficiency      . Palpitations    occasional  . Personal history of rheumatic heart disease   . SBE (subacute bacterial endocarditis) (HCC)    prophylaxis  . Sliding hiatal hernia     Patient Active Problem List   Diagnosis Date Noted  . Atrial flutter with controlled response ([place]) 09/18/2015  . Pericardial effusion 09/18/2015  . Aortic regurgitation 08/13/2015  . Essential hypertension 09/05/2013  . Anxiety 06/29/2012  . GERD (gastroesophageal reflux disease) 03/11/2011  . Dyspnea 10/30/2010  . Mitral stenosis and aortic insufficiency 06/23/2010    Past Surgical History:  Procedure Laterality Date  . ABDOMINAL HYSTERECTOMY  1975  . APPENDECTOMY  1977  . BUNIONECTOMY Right 2000s  . CARDIAC  CATHETERIZATION  01/13/1995   normal coronary anatomy and mild mitral stenosis and mild pulmonary hypertension  . CARDIOVERSION N/A 09/22/2015   Procedure: CARDIOVERSION;  Surgeon: Jerline Pain, MD;  Location: [facility];  Service: Cardiovascular;  Laterality: N/A;  . CATARACT EXTRACTION W/ INTRAOCULAR LENS  IMPLANT, BILATERAL Bilateral 2013  . LAPAROSCOPIC CHOLECYSTECTOMY  1997  . TONSILLECTOMY AND ADENOIDECTOMY  1990s  . UVULOPALATOPHARYNGOPLASTY, TONSILLECTOMY AND SEPTOPLASTY  1990s    OB History    No data available       Home Medications    Prior to Admission medications   Medication Sig Start Date End Date Taking? Authorizing Provider  ALPRAZolam Duanne Moron) 0.25 MG tablet Take 0.25 mg by mouth as needed for anxiety.   Yes Historical Provider, MD  B Complex Vitamins (VITAMIN B COMPLEX PO) Take 1 capsule by mouth daily.     Yes Historical Provider, MD  Calcium Carbonate (CALCIUM 500 PO) Take 500 mg by mouth daily.     Yes Historical Provider, MD  cetirizine (ZYRTEC) 10 MG tablet Take 10 mg by mouth at bedtime.   Yes Historical Provider, MD  Cholecalciferol (VITAMIN D) 2000 UNITS CAPS Take 2,000 Units by mouth daily.     Yes Historical Provider, MD  conjugated estrogens (PREMARIN) vaginal cream Place 1 Applicatorful vaginally daily.   Yes Historical Provider, MD  dorzolamide-timolol (COSOPT) 22.3-6.8 MG/ML ophthalmic solution PLACE 1 DROP INTO EACH EYE TWICE A DAY 04/12/15  Yes  Historical Provider, MD  gabapentin (NEURONTIN) 300 MG capsule Take 300 mg by mouth 3 (three) times daily.   Yes Historical Provider, MD  latanoprost (XALATAN) 0.005 % ophthalmic solution Place 1 drop into both eyes daily. 06/17/14  Yes Historical Provider, MD  multivitamin [facility]) per tablet Take 1 tablet by mouth daily.     Yes Historical Provider, MD  omeprazole (PRILOSEC) 40 MG capsule Take 40 mg by mouth daily.    Yes Historical Provider, MD  psyllium (METAMUCIL) 58.6 % powder Take 1 packet by mouth  daily as needed (for constipation).    Yes Historical Provider, MD  apixaban (ELIQUIS) 5 MG TABS tablet Take 1 tablet (5 mg total) by mouth 2 (two) times daily. 09/23/15   Brittainy Erie Noe, PA-C  apixaban (ELIQUIS) 5 MG TABS tablet Take 1 tablet (5 mg total) by mouth 2 (two) times daily. 09/23/15   Brittainy Erie Noe, PA-C  metoprolol succinate (TOPROL-XL) 25 MG 24 hr tablet Take 1 tablet (25 mg total) by mouth daily. 09/23/15   Brittainy Erie Noe, PA-C  ondansetron (ZOFRAN) 4 MG tablet Take 1 tablet (4 mg total) by mouth every 8 (eight) hours as needed for nausea or vomiting. 09/23/15   Brittainy Erie Noe, PA-C    Family History Family History  Problem Relation Age of Onset  . Heart attack Mother   . Hypertension Brother   . Kidney disease Brother   . Diabetes Brother   . Heart failure Maternal Grandfather     Social History Social History  Substance Use Topics  . Smoking status: Never Smoker  . Smokeless tobacco: Never Used  . Alcohol use No     Allergies   Review of patient's allergies indicates no known allergies.   Review of Systems Review of Systems  Constitutional: Negative for chills, fatigue and fever.  Respiratory: Positive for shortness of breath. Negative for chest tightness.   Cardiovascular: Positive for palpitations. Negative for chest pain and leg swelling.  Gastrointestinal: Negative for abdominal pain, constipation, diarrhea, nausea and vomiting.  Genitourinary: Negative for flank pain and frequency.  Musculoskeletal: Negative for back pain, myalgias and neck pain.  Skin: Negative for rash and wound.  Neurological: Negative for dizziness, syncope, weakness, light-headedness, numbness and headaches.  All other systems reviewed and are negative.    Physical Exam Updated Vital Signs BP (!) 115/48 (BP Location: Left Arm)   Pulse 70   Temp 97.7 F (36.5 C) (Oral)   Resp 17   Ht 5' (1.524 m)   Wt 159 lb (72.1 kg)   SpO2 96%   BMI 31.05 kg/m    Physical Exam  Constitutional: She is oriented to person, place, and time. She appears well-developed and well-nourished.  HENT:  Head: Normocephalic and atraumatic.  Mouth/Throat: Oropharynx is clear and moist.  Eyes: EOM are normal. Pupils are equal, round, and reactive to light.  Neck: Normal range of motion. Neck supple. No JVD present.  Cardiovascular: Normal rate and intact distal pulses.   Irregular rhythm questionable gallop present  Pulmonary/Chest: Effort normal and breath sounds normal. No respiratory distress. She has no wheezes. She has no rales. She exhibits no tenderness.  Abdominal: Soft. Bowel sounds are normal. There is no tenderness. There is no rebound and no guarding.  Musculoskeletal: Normal range of motion. She exhibits no edema or tenderness.  No lower extremity swelling, asymmetry or tenderness. Distal pulses are equal.  Neurological: She is alert and oriented to person, place, and time.  Moves all  extremities without focal deficit. Sensation intact.  Skin: Skin is warm and dry. Capillary refill takes less than 2 seconds. No rash noted. No erythema.  Psychiatric: She has a normal mood and affect. Her behavior is normal.  Nursing note and vitals reviewed.    ED Treatments / Results  Labs (all labs ordered are listed, but only abnormal results are displayed) Labs Reviewed  URINALYSIS, ROUTINE W REFLEX MICROSCOPIC (NOT AT [facility]) - Abnormal; Notable for the following:       Result Value   Hgb urine dipstick SMALL (*)    All other components within normal limits  URINE MICROSCOPIC-ADD ON - Abnormal; Notable for the following:    Squamous Epithelial / LPF 0-5 (*)    Bacteria, UA RARE (*)    All other components within normal limits  COMPREHENSIVE METABOLIC PANEL - Abnormal; Notable for the following:    Chloride 99 (*)    Glucose, Bld 106 (*)    All other components within normal limits  BRAIN NATRIURETIC PEPTIDE - Abnormal; Notable for the following:    B  Natriuretic Peptide 256.2 (*)    All other components within normal limits  BASIC METABOLIC PANEL  CBC  TSH  T4, FREE  PROTIME-INR  HEPARIN LEVEL (UNFRACTIONATED)  HEPARIN LEVEL (UNFRACTIONATED)  CBC  MAGNESIUM  I-STAT TROPOININ, ED    EKG  EKG Interpretation  Date/Time:  Thursday September 18 2015 11:23:43 EDT Ventricular Rate:  89 PR Interval:    QRS Duration: 76 QT Interval:  344 QTC Calculation: 418 R Axis:   90 Text Interpretation:  Undetermined rhythm Rightward axis Nonspecific T wave abnormality Abnormal ECG Confirmed by Lita Mains  MD, Filomeno Cromley ([id number]) on 09/18/2015 12:28:34 PM       Radiology No results found.  Procedures Procedures (including critical care time)  Medications Ordered in ED Medications  amiodarone (NEXTERONE PREMIX) 360-4.14 MG/200ML-% (1.8 mg/mL) IV infusion (not administered)  iopamidol (ISOVUE-370) 76 % injection (100 mLs  Contrast Given 09/18/15 1444)  heparin bolus via infusion 3,000 Units (3,000 Units Intravenous Bolus from Bag 09/19/15 1019)     Initial Impression / Assessment and Plan / ED Course  I have reviewed the triage vital signs and the nursing notes.  Pertinent labs & imaging results that were available during my care of the patient were reviewed by me and considered in my medical decision making (see chart for details).  Clinical Course    CT angiogram of the chest without evidence of PE but there is a moderate size pericardial effusion. Discuss with cardiology and will evaluate patient in emergency department.  Final Clinical Impressions(s) / ED Diagnoses   Final diagnoses:  Atrial flutter with controlled response [facility])    New Prescriptions Discharge Medication List as of 09/23/2015 12:28 PM    START taking these medications   Details  !! apixaban (ELIQUIS) 5 MG TABS tablet Take 1 tablet (5 mg total) by mouth 2 (two) times daily., Starting Tue 09/23/2015, Normal    !! apixaban (ELIQUIS) 5 MG TABS tablet Take 1 tablet (5  mg total) by mouth 2 (two) times daily., Starting Tue 09/23/2015, Normal    metoprolol succinate (TOPROL-XL) 25 MG 24 hr tablet Take 1 tablet (25 mg total) by mouth daily., Starting Tue 09/23/2015, Normal    ondansetron (ZOFRAN) 4 MG tablet Take 1 tablet (4 mg total) by mouth every 8 (eight) hours as needed for nausea or vomiting., Starting Tue 09/23/2015, Normal     !! - Potential duplicate medications found.  Please discuss with provider.       Julianne Rice, MD 09/24/15 [phone]

## 2015-09-25 NOTE — Progress Notes (Signed)
This encounter was created in error - please disregard.

## 2015-10-03 ENCOUNTER — Encounter: Payer: Self-pay | Admitting: Physician Assistant

## 2015-10-06 ENCOUNTER — Encounter: Payer: Self-pay | Admitting: Physician Assistant

## 2015-10-06 ENCOUNTER — Ambulatory Visit (INDEPENDENT_AMBULATORY_CARE_PROVIDER_SITE_OTHER): Payer: Medicare Other | Admitting: Physician Assistant

## 2015-10-06 VITALS — BP 143/77 | HR 76 | Ht 60.0 in | Wt 160.4 lb

## 2015-10-06 DIAGNOSIS — Z7901 Long term (current) use of anticoagulants: Secondary | ICD-10-CM

## 2015-10-06 DIAGNOSIS — I480 Paroxysmal atrial fibrillation: Secondary | ICD-10-CM | POA: Diagnosis not present

## 2015-10-06 DIAGNOSIS — I10 Essential (primary) hypertension: Secondary | ICD-10-CM | POA: Diagnosis not present

## 2015-10-06 DIAGNOSIS — R5381 Other malaise: Secondary | ICD-10-CM

## 2015-10-06 MED ORDER — METOPROLOL SUCCINATE ER 25 MG PO TB24: 25.0000 mg | ORAL_TABLET | Freq: Every day | ORAL | 6 refills | Status: DC

## 2015-10-06 NOTE — Progress Notes (Signed)
Cardiology Office Note   Date:  10/06/2015   ID:  Mary Farrell, DOB 1945/11/25, MRN [id number]  PCP:   Melinda Crutch, MD  Cardiologist:  Dr Oliver Barre, PA-C   Chief Complaint  Patient presents with  . Follow-up    sob; randomly. dizziness vertigo    History of Present Illness: Mary Farrell is a 70 y.o. female with a history of moderate AI/MS, HTN, rheumatic fever as a child, and moderate pericardial effusion, unchanged since 2011  D/c 08/29 after admit for atrial flutter, EF nl by echo but LA severely dilated, effusion no change. Amio initially used because BP too low for Dilt but stopped due to nausea. Low-dose BB & Eliquis added, CHA2DS2VASc=3 (HTN, age x 1, female). S/p DCCV>>SR.  Mary Farrell presents for Post hospital follow-up.  Since discharge from the hospital, she is slow to improve. She feels that her handwriting is shaky. She feels that she is still a bit weak. However, before she went to the hospital, she could only do 7 minutes on the treadmill and says that she was going at about 2-1/2 miles an hour. She has not done the treadmill since she went home from the hospital. She has not had much bruising from the apixaban. She is compliant with her medications.  When she holds her hands out, they do not shake. She is very aware of her heart rate and any time it goes up, but has not had palpitations that made her feel like she was in atrial fibrillation again. She has a blood pressure machine she uses on her wrist, but has not used it much. At one point her systolic blood pressure at home was 109.  She is afraid to increase her activity because she is afraid she will go back into atrial fibrillation every time her heart rate goes up.    Past Medical History:  Diagnosis Date  . Aortic regurgitation 08/13/2015  . Arthritis    "hands, wrists, back, feet, toes" (09/18/2015)  . Chest pain    remote cath in 1996 with NORMAL coronaries noted  . Exogenous obesity      history of   . GERD (gastroesophageal reflux disease)   . Glaucoma, both eyes   . Headache, migraine    "stopped in my 50's" (09/18/2015)  . History of cardiovascular stress test 2007   showing no ischemia  . Hypertension   . Mitral stenosis with insufficiency   . Murmur    of mitral stenosis and moderate aortic insufficiency      . Palpitations    occasional  . Personal history of rheumatic heart disease   . SBE (subacute bacterial endocarditis) (HCC)    prophylaxis  . Sliding hiatal hernia     Past Surgical History:  Procedure Laterality Date  . ABDOMINAL HYSTERECTOMY  1975  . APPENDECTOMY  1977  . BUNIONECTOMY Right 2000s  . CARDIAC CATHETERIZATION  01/13/1995   normal coronary anatomy and mild mitral stenosis and mild pulmonary hypertension  . CARDIOVERSION N/A 09/22/2015   Procedure: CARDIOVERSION;  Surgeon: Jerline Pain, MD;  Location: [facility];  Service: Cardiovascular;  Laterality: N/A;  . CATARACT EXTRACTION W/ INTRAOCULAR LENS  IMPLANT, BILATERAL Bilateral 2013  . LAPAROSCOPIC CHOLECYSTECTOMY  1997  . TONSILLECTOMY AND ADENOIDECTOMY  1990s  . UVULOPALATOPHARYNGOPLASTY, TONSILLECTOMY AND SEPTOPLASTY  1990s    Current Outpatient Prescriptions  Medication Sig Dispense Refill  . ALPRAZolam (XANAX) 0.25 MG tablet Take 0.25 mg by mouth  as needed for anxiety.    Marland Kitchen apixaban (ELIQUIS) 5 MG TABS tablet Take 1 tablet (5 mg total) by mouth 2 (two) times daily. 60 tablet 10  . apixaban (ELIQUIS) 5 MG TABS tablet Take 1 tablet (5 mg total) by mouth 2 (two) times daily. 60 tablet 0  . B Complex Vitamins (VITAMIN B COMPLEX PO) Take 1 capsule by mouth daily.      . Calcium Carbonate (CALCIUM 500 PO) Take 500 mg by mouth daily.      . cetirizine (ZYRTEC) 10 MG tablet Take 10 mg by mouth at bedtime.    . Cholecalciferol (VITAMIN D) 2000 UNITS CAPS Take 2,000 Units by mouth daily.      Marland Kitchen conjugated estrogens (PREMARIN) vaginal cream Place 1 Applicatorful vaginally daily.    .  dorzolamide-timolol (COSOPT) 22.3-6.8 MG/ML ophthalmic solution PLACE 1 DROP INTO EACH EYE DAILY  6  . gabapentin (NEURONTIN) 300 MG capsule Take 300 mg by mouth 3 (three) times daily.    Marland Kitchen latanoprost (XALATAN) 0.005 % ophthalmic solution Place 1 drop into both eyes daily.  6  . metoprolol succinate (TOPROL-XL) 25 MG 24 hr tablet Take 1 tablet (25 mg total) by mouth daily. 30 tablet 5  . multivitamin (THERAGRAN) per tablet Take 1 tablet by mouth daily.      Marland Kitchen omeprazole (PRILOSEC) 40 MG capsule Take 40 mg by mouth daily.     . ondansetron (ZOFRAN) 4 MG tablet Take 1 tablet (4 mg total) by mouth every 8 (eight) hours as needed for nausea or vomiting. 20 tablet 0  . psyllium (METAMUCIL) 58.6 % powder Take 1 packet by mouth daily as needed (for constipation).      No current facility-administered medications for this visit.     Allergies:   Review of patient's allergies indicates no known allergies.    Social History:  The patient  reports that she has never smoked. She has never used smokeless tobacco. She reports that she does not drink alcohol or use drugs.   Family History:  The patient's family history includes Diabetes in her brother; Heart attack in her mother; Heart failure in her maternal grandfather; Hypertension in her brother; Kidney disease in her brother.    ROS:  Please see the history of present illness. All other systems are reviewed and negative.    PHYSICAL EXAM: VS:  BP (!) 143/77   Pulse 76   Ht 5' (1.524 m)   Wt 160 lb 6.4 oz (72.8 kg)   BMI 31.33 kg/m  , BMI Body mass index is 31.33 kg/m. GEN: Well nourished, well developed, female in no acute distress  HEENT: normal for age  Neck: no JVD, no carotid bruit, no masses Cardiac: RRR; soft murmur, no rubs, or gallops Respiratory:  Decreased breath sounds bases bilaterally, normal work of breathing GI: soft, nontender, nondistended, + BS MS: no deformity or atrophy; no edema; distal pulses are 2+ in all 4  extremities   Skin: warm and dry, no rash Neuro:  Strength and sensation are intact Psych: euthymic mood, full affect   EKG:  EKG is ordered today. The ekg ordered today demonstrates sinus rhythm, rate 76, no acute ischemic changes.   Recent Labs: 09/18/2015: TSH 1.560 09/19/2015: ALT 24; BUN 9; Creatinine, Ser 0.81; Potassium 4.1; Sodium 137 09/20/2015: Hemoglobin 14.4; Magnesium 2.3; Platelets 177 09/21/2015: B Natriuretic Peptide 256.2    Lipid Panel No results found for: CHOL, TRIG, HDL, CHOLHDL, VLDL, LDLCALC, LDLDIRECT   Wt Readings  from Last 3 Encounters:  10/06/15 160 lb 6.4 oz (72.8 kg)  09/23/15 159 lb (72.1 kg)  08/26/15 159 lb (72.1 kg)     Other studies Reviewed: Additional studies/ records that were reviewed today include: Office notes, hospital records and testing.  ASSESSMENT AND PLAN:  1.  PAF: She's had no additional symptoms indicating that she has been in atrial flutter since discharge. She is compliant with her medications. She is tolerating anticoagulation well. Continue current therapy. [place] the patient, her heart rate is in the 50s at times at home, so will not increase her beta blocker.  2. Hypertension: According to the patient, her blood pressure runs better at home than it is here in the office today. She is encouraged to make sure her blood pressure cuff is accurate, and let us know if it is running higher than she was first aware.  3. Anticoagulation: She is compliant with the Eliquis and having no significant bleeding issues from it. CHA2DS2VASc=3  4. Deconditioning: She is encouraged to increase her activity, albeit slowly. She is encouraged to start a treadmill, but only 5 minutes at a time at first. After she is able to feel comfortable doing 5 minutes at a time, she is to increase it as tolerated. She understands to limit her exertion level.   Current medicines are reviewed at length with the patient today.  The patient does not have concerns  regarding medicines.  The following changes have been made:  no change  Labs/ tests ordered today include:  No orders of the defined types were placed in this encounter.    Disposition:   FU with Dr. Oval Linsey  Signed, Talaysia Pinheiro, [address]  10/06/2015 9:59 AM    [place] Phone: [phone]; Fax: [phone]  This note was written with the assistance of speech recognition software. Please excuse any transcriptional errors.

## 2015-10-06 NOTE — Patient Instructions (Addendum)
Medications:  START taking your Metoprolol 25 mg (1 tab) every night.   Other Instructions:  --Walk on the treadmill 5 mins per day. Gradually increase to 30 minutes/day as you are able to tolerate it. Do this 6 days per week.   Heart-Healthy Eating Plan  Heart-healthy meal planning includes:  Limiting unhealthy fats.  Increasing healthy fats.  Making other small dietary changes. You may need to talk with your doctor or a diet specialist (dietitian) to create an eating plan that is right for you. WHAT TYPES OF FAT SHOULD I CHOOSE?  Choose healthy fats. These include olive oil and canola oil, flaxseeds, walnuts, almonds, and seeds.  Eat more omega-3 fats. These include salmon, mackerel, sardines, tuna, flaxseed oil, and ground flaxseeds. Try to eat fish at least twice each week.  Limit saturated fats. ? Saturated fats are often found in animal products, such as meats, butter, and cream. ? Plant sources of saturated fats include palm oil, palm kernel oil, and coconut oil.  Avoid foods with partially hydrogenated oils in them. These include stick margarine, some tub margarines, cookies, crackers, and other baked goods. These contain trans fats. WHAT GENERAL GUIDELINES DO I NEED TO FOLLOW?  Check food labels carefully. Identify foods with trans fats or high amounts of saturated fat.  Fill one half of your plate with vegetables and green salads. Eat 4-5 servings of vegetables per day. A serving of vegetables is: ? 1 cup of raw leafy vegetables. ?  cup of raw or cooked cut-up vegetables. ?  cup of vegetable juice.  Fill one fourth of your plate with whole grains. Look for the word "whole" as the first word in the ingredient list.  Fill one fourth of your plate with lean protein foods.  Eat 4-5 servings of fruit per day. A serving of fruit is: ? One medium whole fruit. ?  cup of dried fruit. ?  cup of fresh, frozen, or canned fruit. ?  cup of 100% fruit juice.  Eat  more foods that contain soluble fiber. These include apples, broccoli, carrots, beans, peas, and barley. Try to get 20-30 g of fiber per day.  Eat more home-cooked food. Eat less restaurant, buffet, and fast food.  Limit or avoid alcohol.  Limit foods high in starch and sugar.  Avoid fried foods.  Avoid frying your food. Try baking, boiling, grilling, or broiling it instead. You can also reduce fat by: ? Removing the skin from poultry. ? Removing all visible fats from meats. ? Skimming the fat off of stews, soups, and gravies before serving them. ? Steaming vegetables in water or broth.  Lose weight if you are overweight.  Eat 4-5 servings of nuts, legumes, and seeds per week: ? One serving of dried beans or legumes equals  cup after being cooked. ? One serving of nuts equals 1 ounces. ? One serving of seeds equals  ounce or one tablespoon.  You may need to keep track of how much salt or sodium you eat. This is especially true if you have high blood pressure. Talk with your doctor or dietitian to get more information. WHAT FOODS CAN I EAT? Grains Breads, including [place], white, pita, wheat, raisin, rye, oatmeal, and [place]. Tortillas that are neither fried nor made with lard or trans fat. Low-fat rolls, including hotdog and hamburger buns and English muffins. Biscuits. Muffins. Waffles. Pancakes. Light popcorn. Whole-grain cereals. Flatbread. Melba toast. Pretzels. Breadsticks. Rusks. Low-fat snacks. Low-fat crackers, including oyster, saltine, matzo, [place], animal, and  rye. Rice and pasta, including brown rice and pastas that are made with whole wheat.  Vegetables All vegetables.  Fruits All fruits, but limit coconut. Meats and Other Protein Sources Lean, well-trimmed beef, veal, pork, and lamb. Chicken and [place] without skin. All fish and shellfish. Wild duck, rabbit, pheasant, and venison. Egg whites or low-cholesterol egg substitutes. Dried beans, peas, lentils, and tofu.  Seeds and most nuts. Dairy Low-fat or nonfat cheeses, including ricotta, string, and mozzarella. Skim or 1% milk that is liquid, powdered, or evaporated. Buttermilk that is made with low-fat milk. Nonfat or low-fat yogurt. Beverages Mineral water. Diet carbonated beverages. Sweets and Desserts Sherbets and fruit ices. Honey, jam, marmalade, jelly, and syrups. Meringues and gelatins. Pure sugar candy, such as hard candy, jelly beans, gumdrops, mints, marshmallows, and small amounts of dark chocolate. [employer] Eat all sweets and desserts in moderation. Fats and Oils Nonhydrogenated (trans-free) margarines. Vegetable oils, including soybean, sesame, sunflower, olive, peanut, safflower, corn, canola, and cottonseed. Salad dressings or mayonnaise made with a vegetable oil. Limit added fats and oils that you use for cooking, baking, salads, and as spreads. Other Cocoa powder. Coffee and tea. All seasonings and condiments. The items listed above may not be a complete list of recommended foods or beverages. Contact your dietitian for more options. WHAT FOODS ARE NOT RECOMMENDED? Grains Breads that are made with saturated or trans fats, oils, or whole milk. Croissants. Butter rolls. Cheese breads. Sweet rolls. Donuts. Buttered popcorn. Chow mein noodles. High-fat crackers, such as cheese or butter crackers. Meats and Other Protein Sources Fatty meats, such as hotdogs, short ribs, sausage, spareribs, bacon, rib eye roast or steak, and mutton. High-fat deli meats, such as salami and bologna. Caviar. Domestic duck and goose. Organ meats, such as kidney, liver, sweetbreads, and heart. Dairy Cream, sour cream, cream cheese, and creamed cottage cheese. Whole-milk cheeses, including blue (bleu), Monte[place] Jack, [place], [place], American, [place], Swiss, cheddar, [place], and [place]. Whole or 2% milk that is liquid, evaporated, or condensed. Whole buttermilk. Cream sauce or high-fat cheese sauce. Yogurt  that is made from whole milk. Beverages Regular sodas and juice drinks with added sugar. Sweets and Desserts Frosting. Pudding. Cookies. Cakes other than angel food cake. Candy that has milk chocolate or white chocolate, hydrogenated fat, butter, coconut, or unknown ingredients. Buttered syrups. Full-fat ice cream or ice cream drinks. Fats and Oils Gravy that has suet, meat fat, or shortening. Cocoa butter, hydrogenated oils, palm oil, coconut oil, palm kernel oil. These can often be found in baked products, candy, fried foods, nondairy creamers, and whipped toppings. Solid fats and shortenings, including bacon fat, salt pork, lard, and butter. Nondairy cream substitutes, such as coffee creamers and sour cream substitutes. Salad dressings that are made of unknown oils, cheese, or sour cream. The items listed above may not be a complete list of foods and beverages to avoid. Contact your dietitian for more information.  This information is not intended to replace advice given to you by your health care provider. Make sure you discuss any questions you have with your health care provider.  Document Released: 07/13/2011 Document Revised: 02/01/2014 Document Reviewed: 07/05/2013 Elsevier Interactive Patient Education 2016 Elsevier Inc.   Follow-Up:  Keep your scheduled appointment with Dr. Oval Linsey on 12/04/15 at 9:30am. Please arrive 15 mins prior.  If you need a refill on your cardiac medications before your next appointment, please call your pharmacy.

## 2015-10-14 ENCOUNTER — Telehealth: Payer: Self-pay | Admitting: Cardiovascular Disease

## 2015-10-14 MED ORDER — LOSARTAN POTASSIUM 100 MG PO TABS: 100.0000 mg | ORAL_TABLET | Freq: Every day | ORAL | 3 refills | Status: DC

## 2015-10-14 NOTE — Telephone Encounter (Signed)
Spoke to patient. She notes she had med changes out of hospital (d/ced from nadolol, losartan, hctz), was seen for f/u by Suanne Marker 1 week ago who recommended she continue metoprolol and track BPs. Pt notes lowest systolic reading in past week was 140, consistently running 140s-150s/60s-70s. She notes that when Dr. Percival Spanish rounded in the hospital, he suggested she hold on to her bottle of losartan in case it needs to be restarted. Pt asking for advice. Notes she had made appt w Dr. Oval Linsey for next week, though BP is only acute concern and she isn't recommended to be reseen until November. Informed patient I would inquire on any recommendations for med changes and notify her. She understands that if we resume losartan it will take several days for intended benefit.

## 2015-10-14 NOTE — Telephone Encounter (Signed)
She can restart losartan 100mg  daily. Please have her continue to monitor and bring logs with her to next visit. She has a follow up appt with Dr. Oval Linsey next week it looks like. If this appt is to be moved to Nov?? Then we should have her come in for a bp check with hypertension clinic in 2-4 weeks.

## 2015-10-14 NOTE — Telephone Encounter (Signed)
Pt advised on recommendations, voiced thanks and acknowledgment. She will start losartan 100mg  back tonight and take daily, note if any new concerns, o/w f/u as scheduled.

## 2015-10-14 NOTE — Telephone Encounter (Signed)
New Message  Pt c/o BP issue: STAT if pt c/o blurred vision, one-sided weakness or slurred speech  1. What are your last 5 BP readings?  9.17@1030 -152/72, 9.17@900pm -147/71, 9.18@1030 -144/68, 9.18@940pm -141/65, 9.19@850am -154/70  2. Are you having any other symptoms (ex. Dizziness, headache, blurred vision, passed out)? Headaches  3. What is your BP issue? Pt voiced since d/charged pts BP has been high. Pt voiced her BP usually runs low. Pt voiced she's taking a wrist cuff also.  Please f/u with pt.

## 2015-10-19 ENCOUNTER — Other Ambulatory Visit: Payer: Self-pay | Admitting: Cardiology

## 2015-10-20 ENCOUNTER — Telehealth: Payer: Self-pay | Admitting: Pharmacist

## 2015-10-20 NOTE — Telephone Encounter (Signed)
Prior Auth for Eliquis approved - Covermymeds Key [id number].

## 2015-10-21 ENCOUNTER — Ambulatory Visit (INDEPENDENT_AMBULATORY_CARE_PROVIDER_SITE_OTHER): Payer: Medicare Other | Admitting: Cardiovascular Disease

## 2015-10-21 ENCOUNTER — Encounter: Payer: Self-pay | Admitting: Cardiovascular Disease

## 2015-10-21 VITALS — BP 146/78 | HR 76 | Ht 60.0 in | Wt 160.6 lb

## 2015-10-21 DIAGNOSIS — I10 Essential (primary) hypertension: Secondary | ICD-10-CM

## 2015-10-21 DIAGNOSIS — I080 Rheumatic disorders of both mitral and aortic valves: Secondary | ICD-10-CM | POA: Diagnosis not present

## 2015-10-21 DIAGNOSIS — I4892 Unspecified atrial flutter: Secondary | ICD-10-CM

## 2015-10-21 DIAGNOSIS — I313 Pericardial effusion (noninflammatory): Secondary | ICD-10-CM

## 2015-10-21 DIAGNOSIS — I3139 Other pericardial effusion (noninflammatory): Secondary | ICD-10-CM

## 2015-10-21 MED ORDER — FLECAINIDE ACETATE 50 MG PO TABS: 50.0000 mg | ORAL_TABLET | Freq: Two times a day (BID) | ORAL | 5 refills | Status: DC

## 2015-10-21 MED ORDER — CARVEDILOL 6.25 MG PO TABS: 6.2500 mg | ORAL_TABLET | Freq: Two times a day (BID) | ORAL | 5 refills | Status: DC

## 2015-10-21 NOTE — Patient Instructions (Signed)
Medication Instructions:  STOP METOPROLOL   START CARVEDILOL 6.25 MG TWICE A DAY  START FLECAINIDE 50 MG TWICE A DAY  Labwork: NONE  Testing/Procedures: Your physician has requested that you have an echocardiogram. Echocardiography is a painless test that uses sound waves to create images of your heart. It provides your doctor with information about the size and shape of your heart and how well your heart's chambers and valves are working. This procedure takes approximately one hour. There are no restrictions for this procedure. LIMITED ECHO   Follow-Up: Your physician recommends that you schedule a follow-up appointment in: [place]  If you need a refill on your cardiac medications before your next appointment, please call your pharmacy.

## 2015-10-21 NOTE — Progress Notes (Signed)
Cardiology Office Note   Date:  10/26/2015   ID:  Mary Farrell, DOB 08-25-1945, MRN [id number]  PCP:   Melinda Crutch, MD  Cardiologist:   Skeet Latch, MD   Chief Complaint  Patient presents with  . Follow-up    lightheaded; occasionally    History of Present Illness: Mary Farrell is a 70 y.o. female with moderate aortic regurgitation, mild-moderate mitral stenosis, chronic pericardial effusion, and hypertension who presents for follow up on exertional dyspnea.  Ms. Mary Farrell was previously a patient of Dr. Mare Ferrari.  She followed up with Truitt Merle on 04/2015 due to shortness of breath that she felt was getting worse.  She was referred for an echo 04/2015 that revealed LVEF 55-60% with moderate AR and mild-moderate MS.  There was also a moderate pericardial effusion but no evidence of tamponade.  She had a heart cath 12/1994 that revealed normal coronaries with mild MS and mild pulmonary hypertension.  She also had a Cardiolite in 2007 that was negative for ischemia.  She was seen in clinic 07/2015 and reported persistent shortness of breath with minimal exertion and chest discomfort.  She was referred for exercise Myoview 08/26/15 that revealed LVEF 60% and no perfusion defects..    Ms. Mary Farrell was admitted 08/2015 with atrial flutter.  She ha an echo that showed a persistent moderate-severe pericardial effusion but no tamponade.  She was started on amiodarone, which she did not tolerate due to nausea.  She underwent DCCV on 09/22/15.  Since discharged she has been feeling weak and shaky.  Sh enotes that her blood pressure has been elevated and she has a persistent bad taste in her mouth.  She has been feeling very anxious that she might go back into atrial fibrillation.  She continues to have short episodes of left sided chest pain that occur both at rest and with exertion.  She denies shortness of breath, but her husband thinks she is short of breath.  He also notes that she has been snoring  more than usual and she gasps for breath while she is sleeping.  She denies lower extremity edema, orthopnea of PND.  She needs outpatient surgery on her foot and want to know about her surgical risk.   Past Medical History:  Diagnosis Date  . Aortic regurgitation 08/13/2015  . Arthritis    "hands, wrists, back, feet, toes" (09/18/2015)  . Chest pain    remote cath in 1996 with NORMAL coronaries noted  . Exogenous obesity    history of   . GERD (gastroesophageal reflux disease)   . Glaucoma, both eyes   . Headache, migraine    "stopped in my 50's" (09/18/2015)  . History of cardiovascular stress test 2007   showing no ischemia  . Hypertension   . Mitral stenosis with insufficiency   . Murmur    of mitral stenosis and moderate aortic insufficiency      . Palpitations    occasional  . Personal history of rheumatic heart disease   . SBE (subacute bacterial endocarditis)    prophylaxis  . Sliding hiatal hernia     Past Surgical History:  Procedure Laterality Date  . ABDOMINAL HYSTERECTOMY  1975  . APPENDECTOMY  1977  . BUNIONECTOMY Right 2000s  . CARDIAC CATHETERIZATION  01/13/1995   normal coronary anatomy and mild mitral stenosis and mild pulmonary hypertension  . CARDIOVERSION N/A 09/22/2015   Procedure: CARDIOVERSION;  Surgeon: Jerline Pain, MD;  Location: [facility];  Service:  Cardiovascular;  Laterality: N/A;  . CATARACT EXTRACTION W/ INTRAOCULAR LENS  IMPLANT, BILATERAL Bilateral 2013  . LAPAROSCOPIC CHOLECYSTECTOMY  1997  . TONSILLECTOMY AND ADENOIDECTOMY  1990s  . UVULOPALATOPHARYNGOPLASTY, TONSILLECTOMY AND SEPTOPLASTY  1990s     Current Outpatient Prescriptions  Medication Sig Dispense Refill  . ALPRAZolam (XANAX) 0.25 MG tablet Take 0.25 mg by mouth as needed for anxiety.    . B Complex Vitamins (VITAMIN B COMPLEX PO) Take 1 capsule by mouth daily.      . Calcium Carbonate (CALCIUM 500 PO) Take 500 mg by mouth daily.      . cetirizine (ZYRTEC) 10 MG tablet  Take 10 mg by mouth at bedtime.    . Cholecalciferol (VITAMIN D) 2000 UNITS CAPS Take 2,000 Units by mouth daily.      Marland Kitchen conjugated estrogens (PREMARIN) vaginal cream Place 1 Applicatorful vaginally daily.    . dorzolamide-timolol (COSOPT) 22.3-6.8 MG/ML ophthalmic solution PLACE 1 DROP INTO EACH EYE DAILY  6  . ELIQUIS 5 MG TABS tablet TAKE 1 TABLET BY MOUTH 2 TIMES DAILY. 180 tablet 1  . gabapentin (NEURONTIN) 300 MG capsule Take 300 mg by mouth 3 (three) times daily.    Marland Kitchen latanoprost (XALATAN) 0.005 % ophthalmic solution Place 1 drop into both eyes daily.  6  . losartan (COZAAR) 100 MG tablet Take 1 tablet (100 mg total) by mouth daily. 90 tablet 3  . multivitamin (THERAGRAN) per tablet Take 1 tablet by mouth daily.      Marland Kitchen omeprazole (PRILOSEC) 40 MG capsule Take 40 mg by mouth daily.     . ondansetron (ZOFRAN) 4 MG tablet Take 1 tablet (4 mg total) by mouth every 8 (eight) hours as needed for nausea or vomiting. 20 tablet 0  . psyllium (METAMUCIL) 58.6 % powder Take 1 packet by mouth daily as needed (for constipation).     . carvedilol (COREG) 6.25 MG tablet Take 1 tablet (6.25 mg total) by mouth 2 (two) times daily. 60 tablet 5  . flecainide (TAMBOCOR) 50 MG tablet Take 1 tablet (50 mg total) by mouth 2 (two) times daily. 60 tablet 5   No current facility-administered medications for this visit.     Allergies:   Review of patient's allergies indicates no known allergies.    Social History:  The patient  reports that she has never smoked. She has never used smokeless tobacco. She reports that she does not drink alcohol or use drugs.   Family History:  The patient's  family history includes Diabetes in her brother; Heart attack in her mother; Heart failure in her maternal grandfather; Hypertension in her brother; Kidney disease in her brother.    ROS:  Please see the history of present illness.   Otherwise, review of systems are positive for none.   All other systems are reviewed and  negative.    PHYSICAL EXAM: VS:  BP (!) 146/78   Pulse 76   Ht 5' (1.524 m)   Wt 160 lb 9.6 oz (72.8 kg)   BMI 31.37 kg/m  , BMI Body mass index is 31.37 kg/m. GENERAL:  Well appearing HEENT:  Pupils equal round and reactive, fundi not visualized, oral mucosa unremarkable NECK:  No jugular venous distention, waveform within normal limits, carotid upstroke brisk and symmetric, no bruits, no thyromegaly LYMPHATICS:  No cervical adenopathy LUNGS:  Clear to auscultation bilaterally HEART:  RRR.  PMI not displaced or sustained,S1 and S2 within normal limits, +S3, no S4, no clicks, no rubs, no  murmurs ABD:  Flat, positive bowel sounds normal in frequency in pitch, no bruits, no rebound, no guarding, no midline pulsatile mass, no hepatomegaly, no splenomegaly EXT:  2 plus pulses throughout, no edema, no cyanosis no clubbing SKIN:  No rashes no nodules NEURO:  Cranial nerves II through XII grossly intact, motor grossly intact throughout PSYCH:  Cognitively intact, oriented to person place and time   EKG:  EKG is not ordered today. The ekg ordered 05/07/15 demonstraSinus bradycardia rate 57 bpm.  Echo 09/18/15: Study Conclusions  - Left ventricle: The cavity size was normal. Wall thickness was   increased in a pattern of mild LVH. Systolic function was normal.   The estimated ejection fraction was in the range of 55% to 60%. - Aortic valve: There was mild regurgitation. - Mitral valve: MV is thickened, calcified with restricted motion.   Doppler interrogation was not done on valve to evaluate. Order   limited echo if indicated. Calcified annulus. - Left atrium: The atrium was severely dilated. - Pericardium, extracardiac: Moderate to large pericardial effusion   surrounds heart (12-18 mm) There is no signs of tamponade by echo   criteria. Clinical correlation indidated. Appears slightly more   prominent when compared to echo of April 2017.  Impressions:  - Limited echo Doppler  interrogations of valves not done.  Exercise Myoveiw 08/26/15:  The left ventricular ejection fraction is normal (55-65%).  Nuclear stress EF: 60%.  Blood pressure demonstrated a hypertensive response to exercise.  Upsloping ST segment depression ST segment depression of 1 mm was noted during stress in the II, III, V6, V5 and aVF leads.  This is a low risk study.   No reversible ischemia. LVEF 60% with normal wall motion. Fair exercise tolerance. No chest pain. This is a low risk study.   Echo 05/03/15:  Study Conclusions  - Left ventricle: The cavity size was normal. Wall thickness was  increased in a pattern of mild LVH. Systolic function was normal.  The estimated ejection fraction was in the range of 55% to 60%.  Wall motion was normal; there were no regional wall motion  abnormalities. Ratio of mitral valve peak E velocity to average  of medial and lateral annulus peak E velocity: 46.85.  Longitudinal strain, TDI:- 18 %. - Aortic valve: There was moderate regurgitation. - Mitral valve: Moderately thickened annulus. The findings are  consistent with mild to moderate stenosis. There was mild  regurgitation. Mean gradient (D): 5 mm Hg. Peak gradient (D): 14  mm Hg. Valve area by pressure half-time: 1.33 cm^2. Valve area by  continuity equation (using LVOT flow): 0.94 cm^2. - Left atrium: The atrium was severely dilated. - Right ventricle: The cavity size was mildly decreased. - Pericardium, extracardiac: A moderate pericardial effusion was  identified circumferential to the heart. There was no evidence of  hemodynamic compromise.  Recent Labs: 09/18/2015: TSH 1.560 09/19/2015: ALT 24; BUN 9; Creatinine, Ser 0.81; Potassium 4.1; Sodium 137 09/20/2015: Hemoglobin 14.4; Magnesium 2.3; Platelets 177 09/21/2015: B Natriuretic Peptide 256.2    Lipid Panel No results found for: CHOL, TRIG, HDL, CHOLHDL, VLDL, LDLCALC, LDLDIRECT    Wt Readings from Last 3 Encounters:   10/21/15 160 lb 9.6 oz (72.8 kg)  10/06/15 160 lb 6.4 oz (72.8 kg)  09/23/15 159 lb (72.1 kg)      ASSESSMENT AND PLAN:  # Atypical chest pain: # Shortness of breath: Ms. Osier had a LHC in 1996 that did not reveal obstructive coronary disease and her  stress in 2007 was negative for ischemia.  Nuclear stress test was negative.  Her chest pain seems to be non-cardiac.  # Mild-moderate mitral stenosis: # Moderate AR: # Rheumatic heart disease: The last echo 8/24 revealed mitral valve restriction but was insufficient to quantify mitral stenosis or regurgitation.  We will get a limited echo to better assess.   # Hypertension: Blood pressure mildly elevated.  We will switch metoprolol to carvedilol 6.7m bid.  She was on nadalol prior to developing atrial flutter.  Continue losartan.  # Paroxysmal Atrial flutter: Ms. GSpanowas very symptomatic in atrial flutter and is very concerned about going back into atrial flutter.  We will start flecainide 50 mg bid.  She is on Eliquis and a beta blocker.  She already had a negative stress test.  # Pericardial effusion: moderate to large effusion but no tamponade. ESR, ANA, and rheumatoid factor were all negative.  Current medicines are reviewed at length with the patient today.  The patient does not have concerns regarding medicines.  The following changes have been made:  Switch metoprolol to carvedilol.  Start flecainide.   Labs/ tests ordered today include:   No orders of the defined types were placed in this encounter.    Disposition:   [facility] with Tiffany C. ROval Linsey MD, [facility]in 1 month.   This note was written with the assistance of speech recognition software.  Please excuse any transcriptional errors.  Signed, Tiffany C. ROval Linsey MD, [facility] 10/26/2015 6:10 PM    [facility]

## 2015-10-22 ENCOUNTER — Other Ambulatory Visit: Payer: Self-pay | Admitting: Cardiovascular Disease

## 2015-10-22 ENCOUNTER — Ambulatory Visit (HOSPITAL_COMMUNITY): Payer: Medicare Other | Attending: Cardiovascular Disease

## 2015-10-22 DIAGNOSIS — I313 Pericardial effusion (noninflammatory): Secondary | ICD-10-CM

## 2015-10-22 DIAGNOSIS — I3139 Other pericardial effusion (noninflammatory): Secondary | ICD-10-CM

## 2015-10-22 DIAGNOSIS — I319 Disease of pericardium, unspecified: Secondary | ICD-10-CM | POA: Diagnosis not present

## 2015-10-22 DIAGNOSIS — I340 Nonrheumatic mitral (valve) insufficiency: Secondary | ICD-10-CM | POA: Insufficient documentation

## 2015-10-22 DIAGNOSIS — I351 Nonrheumatic aortic (valve) insufficiency: Secondary | ICD-10-CM | POA: Insufficient documentation

## 2015-10-22 DIAGNOSIS — I119 Hypertensive heart disease without heart failure: Secondary | ICD-10-CM | POA: Diagnosis not present

## 2015-10-22 DIAGNOSIS — I080 Rheumatic disorders of both mitral and aortic valves: Secondary | ICD-10-CM

## 2015-10-30 ENCOUNTER — Other Ambulatory Visit: Payer: Self-pay | Admitting: Pharmacist

## 2015-10-30 MED ORDER — APIXABAN 5 MG PO TABS
5.0000 mg | ORAL_TABLET | Freq: Two times a day (BID) | ORAL | 1 refills | Status: DC
Start: 2015-10-30 — End: 2016-03-20

## 2015-11-18 ENCOUNTER — Telehealth: Payer: Self-pay | Admitting: *Deleted

## 2015-11-18 NOTE — Telephone Encounter (Signed)
"  I'm a patient of Dr. Paulla Dolly.  I saw him in August.  He talked about doing some surgery on my toe.  I have some questions about that. Please give me a call."

## 2015-11-20 ENCOUNTER — Encounter: Payer: Self-pay | Admitting: Cardiovascular Disease

## 2015-11-20 ENCOUNTER — Ambulatory Visit (INDEPENDENT_AMBULATORY_CARE_PROVIDER_SITE_OTHER): Payer: Medicare Other | Admitting: Cardiovascular Disease

## 2015-11-20 VITALS — BP 133/66 | HR 68 | Ht 60.0 in | Wt 162.2 lb

## 2015-11-20 DIAGNOSIS — I080 Rheumatic disorders of both mitral and aortic valves: Secondary | ICD-10-CM

## 2015-11-20 DIAGNOSIS — I10 Essential (primary) hypertension: Secondary | ICD-10-CM | POA: Diagnosis not present

## 2015-11-20 DIAGNOSIS — I4892 Unspecified atrial flutter: Secondary | ICD-10-CM | POA: Diagnosis not present

## 2015-11-20 DIAGNOSIS — Z79899 Other long term (current) drug therapy: Secondary | ICD-10-CM | POA: Diagnosis not present

## 2015-11-20 MED ORDER — HYDROCHLOROTHIAZIDE 12.5 MG PO CAPS: 12.5000 mg | ORAL_CAPSULE | Freq: Every day | ORAL | 3 refills | Status: DC

## 2015-11-20 NOTE — Patient Instructions (Signed)
Medication Instructions:  START HYDROCHLOROTHIAZIDE 12.5 MG DAILY   Labwork: BMET IN 1 WEEK AT YOUR PCP  Testing/Procedures: NONE  Follow-Up: Your physician wants you to follow-up in: [place] will receive a reminder letter in the mail two months in advance. If you don't receive a letter, please call our office to schedule the follow-up appointment.  If you need a refill on your cardiac medications before your next appointment, please call your pharmacy.

## 2015-11-20 NOTE — Progress Notes (Signed)
Cardiology Office Note   Date:  11/20/2015   ID:  Mary Farrell, DOB 1945/09/11, MRN [id number]  PCP:  Mary Crutch, MD  Cardiologist:   Skeet Latch, MD   Chief Complaint  Patient presents with  . Follow-up    1 month; sob; occasionally. ighthead; occassionally.    History of Present Illness: Mary Farrell is a 70 y.o. female with moderate aortic regurgitation, mild-moderate mitral stenosis, chronic pericardial effusion, and hypertension who presents for follow up on exertional dyspnea.  Mary Farrell was previously a patient of Dr. Mare Ferrari.  She followed up with Mary Farrell on 04/2015 due to shortness of breath that she felt was getting worse.  She was referred for an echo 04/2015 that revealed LVEF 55-60% with moderate AR and mild-moderate MS.  There was also a moderate pericardial effusion but no evidence of tamponade.  She had a heart cath 12/1994 that revealed normal coronaries with mild MS and mild pulmonary hypertension.  She also had a Cardiolite in 2007 that was negative for ischemia.  She was seen in clinic 07/2015 and reported persistent shortness of breath with minimal exertion and chest discomfort.  She was referred for exercise Myoview 08/26/15 that revealed LVEF 60% and no perfusion defects..    Mary Farrell was admitted 08/2015 with atrial flutter.  She had an echo that showed a persistent moderate-severe pericardial effusion but no tamponade.  She was started on amiodarone, which she did not tolerate due to nausea.  She underwent DCCV on 09/22/15.   After that she was feeling weak and shaky.   She was afraid that she would go back into atrial flutter, so she was started on flecainide 09/2015.  Since that appointment she has been feeling well.  She is starting to feel like herself again.  Metoprolol was also switched to carvedilol at that appointment.  Her breathing has been stable and she denies chest pain.  She had an echo 10/22/15 that revealed mild AR and mild to moderate mitral  stenosis.  Her pericardial effusion was less prominent and there was no tamponade.   She brings a log of her blood pressure which have ranged from 117-158/50s-70s.  She is scheduled for an in home sleep study next week.     Past Medical History:  Diagnosis Date  . Aortic regurgitation 08/13/2015  . Arthritis    "hands, wrists, back, feet, toes" (09/18/2015)  . Chest pain    remote cath in 1996 with NORMAL coronaries noted  . Exogenous obesity    history of   . GERD (gastroesophageal reflux disease)   . Glaucoma, both eyes   . Headache, migraine    "stopped in my 50's" (09/18/2015)  . History of cardiovascular stress test 2007   showing no ischemia  . Hypertension   . Mitral stenosis with insufficiency   . Murmur    of mitral stenosis and moderate aortic insufficiency      . Palpitations    occasional  . Personal history of rheumatic heart disease   . SBE (subacute bacterial endocarditis)    prophylaxis  . Sliding hiatal hernia     Past Surgical History:  Procedure Laterality Date  . ABDOMINAL HYSTERECTOMY  1975  . APPENDECTOMY  1977  . BUNIONECTOMY Right 2000s  . CARDIAC CATHETERIZATION  01/13/1995   normal coronary anatomy and mild mitral stenosis and mild pulmonary hypertension  . CARDIOVERSION N/A 09/22/2015   Procedure: CARDIOVERSION;  Surgeon: Jerline Pain, MD;  Location: [facility]  ENDOSCOPY;  Service: Cardiovascular;  Laterality: N/A;  . CATARACT EXTRACTION W/ INTRAOCULAR LENS  IMPLANT, BILATERAL Bilateral 2013  . LAPAROSCOPIC CHOLECYSTECTOMY  1997  . TONSILLECTOMY AND ADENOIDECTOMY  1990s  . UVULOPALATOPHARYNGOPLASTY, TONSILLECTOMY AND SEPTOPLASTY  1990s     Current Outpatient Prescriptions  Medication Sig Dispense Refill  . ALPRAZolam (XANAX) 0.25 MG tablet Take 0.25 mg by mouth as needed for anxiety.    Marland Kitchen apixaban (ELIQUIS) 5 MG TABS tablet Take 1 tablet (5 mg total) by mouth 2 (two) times daily. 180 tablet 1  . B Complex Vitamins (VITAMIN B COMPLEX PO) Take 1 capsule  by mouth daily.      . Calcium Carbonate (CALCIUM 500 PO) Take 500 mg by mouth daily.      . carvedilol (COREG) 6.25 MG tablet Take 1 tablet (6.25 mg total) by mouth 2 (two) times daily. 60 tablet 5  . cetirizine (ZYRTEC) 10 MG tablet Take 10 mg by mouth at bedtime.    . Cholecalciferol (VITAMIN D) 2000 UNITS CAPS Take 2,000 Units by mouth daily.      Marland Kitchen conjugated estrogens (PREMARIN) vaginal cream Place 1 Applicatorful vaginally daily.    . dorzolamide-timolol (COSOPT) 22.3-6.8 MG/ML ophthalmic solution PLACE 1 DROP INTO EACH EYE DAILY  6  . flecainide (TAMBOCOR) 50 MG tablet Take 1 tablet (50 mg total) by mouth 2 (two) times daily. 60 tablet 5  . gabapentin (NEURONTIN) 300 MG capsule Take 300 mg by mouth 3 (three) times daily.    Marland Kitchen latanoprost (XALATAN) 0.005 % ophthalmic solution Place 1 drop into both eyes daily.  6  . losartan (COZAAR) 100 MG tablet Take 1 tablet (100 mg total) by mouth daily. 90 tablet 3  . multivitamin (THERAGRAN) per tablet Take 1 tablet by mouth daily.      Marland Kitchen omeprazole (PRILOSEC) 40 MG capsule Take 40 mg by mouth daily.     . ondansetron (ZOFRAN) 4 MG tablet Take 1 tablet (4 mg total) by mouth every 8 (eight) hours as needed for nausea or vomiting. 20 tablet 0  . psyllium (METAMUCIL) 58.6 % powder Take 1 packet by mouth daily as needed (for constipation).     . hydrochlorothiazide (MICROZIDE) 12.5 MG capsule Take 1 capsule (12.5 mg total) by mouth daily. 90 capsule 3   No current facility-administered medications for this visit.     Allergies:   Metoprolol    Social History:  The patient  reports that she has never smoked. She has never used smokeless tobacco. She reports that she does not drink alcohol or use drugs.   Family History:  The patient's  family history includes Diabetes in her brother; Heart attack in her mother; Heart failure in her maternal grandfather; Hypertension in her brother; Kidney disease in her brother.    ROS:  Please see the history of  present illness.   Otherwise, review of systems are positive for none.   All other systems are reviewed and negative.    PHYSICAL EXAM: VS:  BP 133/66   Pulse 68   Ht 5' (1.524 m)   Wt 73.6 kg (162 lb 3.2 oz)   BMI 31.68 kg/m  , BMI Body mass index is 31.68 kg/m. GENERAL:  Well appearing HEENT:  Pupils equal round and reactive, fundi not visualized, oral mucosa unremarkable NECK:  No jugular venous distention, waveform within normal limits, carotid upstroke brisk and symmetric, no bruits, no thyromegaly LYMPHATICS:  No cervical adenopathy LUNGS:  Clear to auscultation bilaterally HEART:  RRR.  PMI not displaced or sustained,S1 and S2 within normal limits, +S3, no S4, no clicks, no rubs, no murmurs ABD:  Flat, positive bowel sounds normal in frequency in pitch, no bruits, no rebound, no guarding, no midline pulsatile mass, no hepatomegaly, no splenomegaly EXT:  2 plus pulses throughout, no edema, no cyanosis no clubbing SKIN:  No rashes no nodules NEURO:  Cranial nerves II through XII grossly intact, motor grossly intact throughout PSYCH:  Cognitively intact, oriented to person place and time   EKG:  EKG is not ordered today. The ekg ordered 05/07/15 demonstraSinus bradycardia rate 57 bpm.  Echo 09/18/15: Study Conclusions  - Left ventricle: The cavity size was normal. Wall thickness was   increased in a pattern of mild LVH. Systolic function was normal.   The estimated ejection fraction was in the range of 55% to 60%. - Aortic valve: There was mild regurgitation. - Mitral valve: MV is thickened, calcified with restricted motion.   Doppler interrogation was not done on valve to evaluate. Order   limited echo if indicated. Calcified annulus. - Left atrium: The atrium was severely dilated. - Pericardium, extracardiac: Moderate to large pericardial effusion   surrounds heart (12-18 mm) There is no signs of tamponade by echo   criteria. Clinical correlation indidated. Appears  slightly more   prominent when compared to echo of April 2017.  Impressions:  - Limited echo Doppler interrogations of valves not done.  Exercise Myoveiw 08/26/15:  The left ventricular ejection fraction is normal (55-65%).  Nuclear stress EF: 60%.  Blood pressure demonstrated a hypertensive response to exercise.  Upsloping ST segment depression ST segment depression of 1 mm was noted during stress in the II, III, V6, V5 and aVF leads.  This is a low risk study.   No reversible ischemia. LVEF 60% with normal wall motion. Fair exercise tolerance. No chest pain. This is a low risk study.   Echo 05/03/15:  Study Conclusions  - Left ventricle: The cavity size was normal. Wall thickness was  increased in a pattern of mild LVH. Systolic function was normal.  The estimated ejection fraction was in the range of 55% to 60%.  Wall motion was normal; there were no regional wall motion  abnormalities. Ratio of mitral valve peak E velocity to average  of medial and lateral annulus peak E velocity: 46.85.  Longitudinal strain, TDI:- 18 %. - Aortic valve: There was moderate regurgitation. - Mitral valve: Moderately thickened annulus. The findings are  consistent with mild to moderate stenosis. There was mild  regurgitation. Mean gradient (D): 5 mm Hg. Peak gradient (D): 14  mm Hg. Valve area by pressure half-time: 1.33 cm^2. Valve area by  continuity equation (using LVOT flow): 0.94 cm^2. - Left atrium: The atrium was severely dilated. - Right ventricle: The cavity size was mildly decreased. - Pericardium, extracardiac: A moderate pericardial effusion was  identified circumferential to the heart. There was no evidence of  hemodynamic compromise.  Recent Labs: 09/18/2015: TSH 1.560 09/19/2015: ALT 24; BUN 9; Creatinine, Ser 0.81; Potassium 4.1; Sodium 137 09/20/2015: Hemoglobin 14.4; Magnesium 2.3; Platelets 177 09/21/2015: B Natriuretic Peptide 256.2    Lipid Panel No  results found for: CHOL, TRIG, HDL, CHOLHDL, VLDL, LDLCALC, LDLDIRECT    Wt Readings from Last 3 Encounters:  11/20/15 73.6 kg (162 lb 3.2 oz)  10/21/15 72.8 kg (160 lb 9.6 oz)  10/06/15 72.8 kg (160 lb 6.4 oz)      ASSESSMENT AND PLAN:  # Atypical chest pain: #  Shortness of breath: Ms. Viall had a LHC in 1996 that did not reveal obstructive coronary disease and her stress in 2007 was negative for ischemia.  Nuclear stress test was negative.  Her chest pain seems to be non-cardiac and has resolved.  I suspect that her symptoms are attributable to deconditioning and obesity.  # Mild-moderate mitral stenosis: # Moderate AR: # Rheumatic heart disease: Echo 10/22/15 revealed mild to moderate mitral stenosis and mild mitral regurgitation.  The mean gradient was 6 mmHg with a valve area of 1.67 cm  # Hypertension: Blood pressure mildly elevated.  Continue carvedilol and losartan. Her heart rate is too low to increase carvedilol. We will start hydrochlorothiazide 12.5 mg daily. She will get a basic metabolic panel checked in one week. She prefers to do this with her primary care physician and will have the results faxed to [place].  # Paroxysmal Atrial flutter: Ms. Enyeart was very symptomatic in atrial flutter and is very concerned about going back into atrial flutter.  She has not had any recurrent atrial flutter since starting flecainide. Continue carvedilol, flecainide, and Eliquis.   # Pericardial effusion: Moderate pericardial effusion 09/2015 slightly improved from prior. There is no evidence of tamponade not. ESR, ANA, and rheumatoid factor were all negative.  Current medicines are reviewed at length with the patient today.  The patient does not have concerns regarding medicines.  The following changes have been made:  Start HCTZ.  Labs/ tests ordered today include:   Orders Placed This Encounter  Procedures  . Basic metabolic panel     Disposition:   FU with Harlee Pursifull C. Oval Linsey, MD,  [facility] in 6 months   This note was written with the assistance of speech recognition software.  Please excuse any transcriptional errors.  Signed, Bevely Hackbart C. Oval Linsey, MD, [facility]  11/20/2015 1:39 PM    [facility]

## 2015-11-21 ENCOUNTER — Other Ambulatory Visit: Payer: Self-pay | Admitting: *Deleted

## 2015-11-21 MED ORDER — FLECAINIDE ACETATE 50 MG PO TABS: 50.0000 mg | ORAL_TABLET | Freq: Two times a day (BID) | ORAL | 3 refills | Status: DC

## 2015-11-21 MED ORDER — CARVEDILOL 6.25 MG PO TABS: 6.2500 mg | ORAL_TABLET | Freq: Two times a day (BID) | ORAL | 3 refills | Status: DC

## 2015-11-21 NOTE — Telephone Encounter (Signed)
"  I'm a patient of Dr. Paulla Dolly.  Thank you."

## 2015-11-27 NOTE — Telephone Encounter (Signed)
I am returning your call.  How can I help you?  "I know I'm going to have to have surgery.  What type of shoe will I be wearing after surgery and for how long.  He told me when I was there but I forgot.  After I left there I was admitted into the hospital.  So I don't remember what he said."  If you are having a Hammer Toe surgery, you will be wearing a Darco shoe.  "He mentioned that and I think he said I would have to wear it for about 3-4 weeks.  Is it too soon to schedule something for January?"  He does have time available but you actually need to see Dr. Paulla Dolly first for a consultation prior to scheduling surgery.  Would you like me to transfer you to a scheduler to set up an appointment for a consult?  "That would be great."  Call was transferred to a scheduler.

## 2015-12-02 ENCOUNTER — Other Ambulatory Visit: Payer: Self-pay | Admitting: Cardiovascular Disease

## 2015-12-02 LAB — BASIC METABOLIC PANEL
BUN: 15 mg/dL (ref 7–25)
CHLORIDE: 102 mmol/L (ref 98–110)
CO2: 31 mmol/L (ref 20–31)
Calcium: 9.3 mg/dL (ref 8.6–10.4)
Creat: 0.86 mg/dL (ref 0.60–0.93)
GLUCOSE: 97 mg/dL (ref 65–99)
POTASSIUM: 4.3 mmol/L (ref 3.5–5.3)
SODIUM: 141 mmol/L (ref 135–146)

## 2015-12-04 ENCOUNTER — Ambulatory Visit: Payer: Medicare Other | Admitting: Cardiovascular Disease

## 2015-12-08 ENCOUNTER — Ambulatory Visit: Payer: Medicare Other | Admitting: Podiatry

## 2016-01-08 ENCOUNTER — Ambulatory Visit (INDEPENDENT_AMBULATORY_CARE_PROVIDER_SITE_OTHER): Payer: Medicare Other

## 2016-01-08 ENCOUNTER — Ambulatory Visit (INDEPENDENT_AMBULATORY_CARE_PROVIDER_SITE_OTHER): Payer: Medicare Other | Admitting: Podiatry

## 2016-01-08 ENCOUNTER — Encounter: Payer: Self-pay | Admitting: Podiatry

## 2016-01-08 DIAGNOSIS — M25572 Pain in left ankle and joints of left foot: Secondary | ICD-10-CM

## 2016-01-08 DIAGNOSIS — M779 Enthesopathy, unspecified: Secondary | ICD-10-CM

## 2016-01-08 DIAGNOSIS — M2041 Other hammer toe(s) (acquired), right foot: Secondary | ICD-10-CM

## 2016-01-08 DIAGNOSIS — M7752 Other enthesopathy of left foot: Secondary | ICD-10-CM | POA: Diagnosis not present

## 2016-01-08 MED ORDER — TRIAMCINOLONE ACETONIDE 10 MG/ML IJ SUSP
10.0000 mg | Freq: Once | INTRAMUSCULAR | Status: AC
  Administered 2016-01-08: 10 mg

## 2016-01-08 NOTE — Progress Notes (Signed)
Subjective:     Patient ID: Mary Farrell, female   DOB: 1945-07-18, 69 y.o.   MRN: [id number]  HPI patient presents with a lot of pain on top of the left foot and states that she wants to get the fourth toe on her right foot fixed in January   Review of Systems     Objective:   Physical Exam Neurovascular status intact muscle strength adequate with inflammation pain dorsum left foot in the metatarsal cuneiform joints and is found to have a rotated fourth digit right with distal keratotic lesion that's painful when pressed    Assessment:     Probable tendinitis left with possibility of bone injury and hammertoe deformity fourth right    Plan:     H&P both conditions reviewed and for the left reviewed x-rays. I then injected the left dorsal complex 3 mg Kenalog 5 mg Xylocaine and for the right foot I allowed her to read a consent form going over correction alternative treatments and complications. Patient wants surgery signed consent form scheduled for outpatient surgery and understands recovery from this can be anywhere from 3-6 months. Patient is encouraged to call with any questions prior to procedure

## 2016-01-08 NOTE — Patient Instructions (Signed)
Pre-Operative Instructions  Congratulations, you have decided to take an important step to improving your quality of life.  You can be assured that the doctors of [facility] will be with you every step of the way.  1. Plan to be at the surgery center/hospital at least 1 (one) hour prior to your scheduled time unless otherwise directed by the surgical center/hospital staff.  You must have a responsible adult accompany you, remain during the surgery and drive you home.  Make sure you have directions to the surgical center/hospital and know how to get there on time. 2. For hospital based surgery you will need to obtain a history and physical form from your family physician within 1 month prior to the date of surgery- we will give you a form for you primary physician.  3. We make every effort to accommodate the date you request for surgery.  There are however, times where surgery dates or times have to be moved.  We will contact you as soon as possible if a change in schedule is required.   4. No Aspirin/Ibuprofen for one week before surgery.  If you are on aspirin, any non-steroidal anti-inflammatory medications (Mobic, Aleve, Ibuprofen) you should stop taking it 7 days prior to your surgery.  You make take Tylenol  For pain prior to surgery.  5. Medications- If you are taking daily heart and blood pressure medications, seizure, reflux, allergy, asthma, anxiety, pain or diabetes medications, make sure the surgery center/hospital is aware before the day of surgery so they may notify you which medications to take or avoid the day of surgery. 6. No food or drink after midnight the night before surgery unless directed otherwise by surgical center/hospital staff. 7. No alcoholic beverages 24 hours prior to surgery.  No smoking 24 hours prior to or 24 hours after surgery. 8. Wear loose pants or shorts- loose enough to fit over bandages, boots, and casts. 9. No slip on shoes, sneakers are best. 10. Bring  your boot with you to the surgery center/hospital.  Also bring crutches or a walker if your physician has prescribed it for you.  If you do not have this equipment, it will be provided for you after surgery. 11. If you have not been contracted by the surgery center/hospital by the day before your surgery, call to confirm the date and time of your surgery. 12. Leave-time from work may vary depending on the type of surgery you have.  Appropriate arrangements should be made prior to surgery with your employer. 13. Prescriptions will be provided immediately following surgery by your doctor.  Have these filled as soon as possible after surgery and take the medication as directed. 14. Remove nail polish on the operative foot. 15. Wash the night before surgery.  The night before surgery wash the foot and leg well with the antibacterial soap provided and water paying special attention to beneath the toenails and in between the toes.  Rinse thoroughly with water and dry well with a towel.  Perform this wash unless told not to do so by your physician.  Enclosed: 1 Ice pack (please put in freezer the night before surgery)   1 Hibiclens skin cleaner   Pre-op Instructions  If you have any questions regarding the instructions, do not hesitate to call our office.  [place]: [address] , [place] [address] [phone]  [place]: [address] [place], [place] [address] [phone]  [place]: [address]  [place], [place] [address] [phone]   Dr.   Norman Regal DPM, Dr. Matthew Wagoner DPM, Dr. M. Todd Hyatt DPM, Dr. Titorya Stover DPM 

## 2016-01-15 ENCOUNTER — Other Ambulatory Visit: Payer: Self-pay

## 2016-01-15 MED ORDER — HYDROCHLOROTHIAZIDE 12.5 MG PO CAPS: 12.5000 mg | ORAL_CAPSULE | Freq: Every day | ORAL | 3 refills | Status: DC

## 2016-01-29 ENCOUNTER — Telehealth: Payer: Self-pay | Admitting: *Deleted

## 2016-01-29 ENCOUNTER — Ambulatory Visit: Payer: Medicare Other | Admitting: Podiatry

## 2016-01-29 NOTE — Telephone Encounter (Signed)
Pt state she is currently on Eliquis and has surgery scheduled 02/10/2016 with Dr. Paulla Dolly. Pt states she spoke with her cardiologist and Dr. Skeet Latch at [phone], and was told that the surgeon's office needed to contact her for the Eliquis instructions pre and post op.

## 2016-01-30 ENCOUNTER — Encounter: Payer: Self-pay | Admitting: *Deleted

## 2016-01-30 NOTE — Telephone Encounter (Signed)
I'm calling to let you know I sent a letter to Dr. Oval Linsey about the medical clearance and the Eliquis.  "I don't know why I didn't ask her about that when I saw her.  Now I am afraid I will not hear from her in enough time to stop it."  You are okay, we have time.  Either Dr. Oval Linsey or [place] will let you know what to do.

## 2016-02-02 ENCOUNTER — Telehealth: Payer: Self-pay | Admitting: *Deleted

## 2016-02-02 NOTE — Telephone Encounter (Signed)
I am calling to inform you that Dr. Oval Linsey said you can stop taking the Eliquis five days prior to surgery date.  "Did she say when I'm to start taking it again?"  You are to start taking it again after surgery.  "Okay, thank you so much for getting back to me."

## 2016-02-10 ENCOUNTER — Encounter: Payer: Self-pay | Admitting: Podiatry

## 2016-02-10 DIAGNOSIS — M2041 Other hammer toe(s) (acquired), right foot: Secondary | ICD-10-CM | POA: Diagnosis not present

## 2016-02-16 ENCOUNTER — Encounter: Payer: Self-pay | Admitting: Podiatry

## 2016-02-16 ENCOUNTER — Ambulatory Visit (INDEPENDENT_AMBULATORY_CARE_PROVIDER_SITE_OTHER): Payer: Medicare Other | Admitting: Podiatry

## 2016-02-16 ENCOUNTER — Ambulatory Visit (INDEPENDENT_AMBULATORY_CARE_PROVIDER_SITE_OTHER): Payer: Medicare Other

## 2016-02-16 VITALS — Temp 96.5°F

## 2016-02-16 DIAGNOSIS — M2041 Other hammer toe(s) (acquired), right foot: Secondary | ICD-10-CM

## 2016-02-17 NOTE — Progress Notes (Signed)
Subjective:     Patient ID: Mary Farrell, female   DOB: [birth year], 71 y.o.   MRN: [id number]  HPI patient presents stating she's doing real well with her right foot   Review of Systems     Objective:   Physical Exam Neurovascular status intact with right fourth digit healing well with wound edges coapted and good alignment    Assessment:     Doing well hammertoe repair fourth right    Plan:     Reapplied sterile dressing instructed on continued elevation compression immobilization reappoint 2 weeks for stitch removal or earlier if needed  X-ray report indicated satisfactory section of bone

## 2016-02-23 NOTE — Progress Notes (Signed)
DOS 01.16.2018 Arthroplasty with removal bone 4th right.

## 2016-02-24 ENCOUNTER — Encounter: Payer: Self-pay | Admitting: Podiatry

## 2016-03-01 ENCOUNTER — Ambulatory Visit (INDEPENDENT_AMBULATORY_CARE_PROVIDER_SITE_OTHER): Payer: Medicare Other

## 2016-03-01 ENCOUNTER — Ambulatory Visit (INDEPENDENT_AMBULATORY_CARE_PROVIDER_SITE_OTHER): Payer: Self-pay | Admitting: Podiatry

## 2016-03-01 DIAGNOSIS — Z9889 Other specified postprocedural states: Secondary | ICD-10-CM

## 2016-03-01 DIAGNOSIS — M2041 Other hammer toe(s) (acquired), right foot: Secondary | ICD-10-CM

## 2016-03-01 NOTE — Progress Notes (Signed)
Subjective: Mary Farrell is a 71 y.o. is seen today in office s/p right 4th DIPJ arthroplasty preformed on 02-10-16. They state their pain is improved and she is doing well. She presents today for suture removal. Denies any systemic complaints such as fevers, chills, nausea, vomiting. No calf pain, chest pain, shortness of breath.   Objective: General: No acute distress, AAOx3  DP/PT pulses palpable 2/4, CRT < 3 sec to all digits.  Protective sensation intact. Motor function intact.  Right foot: Incision is well coapted without any evidence of dehiscence and sutures are intact. There is no surrounding erythema, ascending cellulitis, fluctuance, crepitus, malodor, drainage/purulence. There is minimal edema around the surgical site. There is minimal pain along the surgical site. Toe is in rectus position  No other areas of tenderness to bilateral lower extremities.  No other open lesions or pre-ulcerative lesions.  No pain with calf compression, swelling, warmth, erythema.   Assessment and Plan:  Status post Right 4th DIPJ artrhoplasty, doing well with no complications   -Treatment options discussed including all alternatives, risks, and complications -Sutures were removed today without complications. Antibiotic ointment and a bandage applied followed by a bandage. She can change daily with antibiotic ointment.  -Can gradually start to transition to a regular shoe as tolerated. -Ice/elevation -Pain medication as needed. She is not taking any narcotic pain medications.  -Monitor for any clinical signs or symptoms of infection and DVT/PE and directed to call the office immediately should any occur or go to the ER. -Follow-up in 4 weeks if there are any issues or sooner if any problems arise. In the meantime, encouraged to call the office with any questions, concerns, change in symptoms.   Celesta Gentile, DPM

## 2016-03-20 ENCOUNTER — Other Ambulatory Visit: Payer: Self-pay | Admitting: Cardiovascular Disease

## 2016-03-22 NOTE — Telephone Encounter (Signed)
Please review for refill. Thanks!  

## 2016-03-29 ENCOUNTER — Ambulatory Visit (INDEPENDENT_AMBULATORY_CARE_PROVIDER_SITE_OTHER): Payer: Medicare Other | Admitting: Podiatry

## 2016-03-29 ENCOUNTER — Ambulatory Visit (INDEPENDENT_AMBULATORY_CARE_PROVIDER_SITE_OTHER): Payer: Medicare Other

## 2016-03-29 ENCOUNTER — Encounter: Payer: Self-pay | Admitting: Podiatry

## 2016-03-29 DIAGNOSIS — M2041 Other hammer toe(s) (acquired), right foot: Secondary | ICD-10-CM

## 2016-03-29 DIAGNOSIS — M779 Enthesopathy, unspecified: Secondary | ICD-10-CM | POA: Diagnosis not present

## 2016-03-29 MED ORDER — TRIAMCINOLONE ACETONIDE 10 MG/ML IJ SUSP
10.0000 mg | Freq: Once | INTRAMUSCULAR | Status: AC
  Administered 2016-03-29: 10 mg

## 2016-03-29 NOTE — Progress Notes (Signed)
Subjective:     Patient ID: Mary Farrell, female   DOB: [birth year], 71 y.o.   MRN: [id number]  HPI patient presents stating that my left foot is inflamed on top and my right fourth toe on still having trouble wearing shoe gear with   Review of Systems     Objective:   Physical Exam Neurovascular status intact negative Homans sign was noted with patient's left forefoot being quite inflamed and painful with tendinitis-like symptoms and the right fourth toe still being mildly swollen after surgery    Assessment:     Normal amount of swelling right fourth toe with inflammation and tendinitis dorsum left foot    Plan:     H&P conditions reviewed and careful dorsal injection administered left 3 mg Kenalog 5 mg Xylocaine and for the right I advised on gradual return to soft shoe gear after reviewing x-ray  X-ray indicates that is for section of bone digit for right with no other pathology

## 2016-05-12 ENCOUNTER — Encounter: Payer: Self-pay | Admitting: Podiatry

## 2016-05-12 ENCOUNTER — Ambulatory Visit (INDEPENDENT_AMBULATORY_CARE_PROVIDER_SITE_OTHER): Payer: Medicare Other | Admitting: Podiatry

## 2016-05-12 ENCOUNTER — Ambulatory Visit (INDEPENDENT_AMBULATORY_CARE_PROVIDER_SITE_OTHER): Payer: Medicare Other

## 2016-05-12 DIAGNOSIS — D3610 Benign neoplasm of peripheral nerves and autonomic nervous system, unspecified: Secondary | ICD-10-CM

## 2016-05-12 DIAGNOSIS — M2041 Other hammer toe(s) (acquired), right foot: Secondary | ICD-10-CM

## 2016-05-12 NOTE — Progress Notes (Signed)
Subjective:     Patient ID: Mary Farrell, female   DOB: 1945-04-02, 71 y.o.   MRN: [id number]  HPI patient states the toe seems to be in good position but she still getting a unusual pain in her right foot that she will had somewhat before surgery   Review of Systems     Objective:   Physical Exam Neurovascular status found to be intact with good alignment of the fourth digit right with keratotic lesion resolved and wound edges well coapted. There is discomfort third interspace right    Assessment:     Possibility for neuroma-like symptomatology right    Plan:     Reviewed x-ray concerning toe and today I'm to focus on the metatarsal and I did a careful neuro lysis injection third interspace with a purified alcohol Marcaine solution 1.5 mL and we'll see results in 2 weeks  X-ray indicates that there is no indications of stress fracture arthritis the metatarsal with toe in good position and satisfactory section of bone

## 2016-05-13 ENCOUNTER — Ambulatory Visit (INDEPENDENT_AMBULATORY_CARE_PROVIDER_SITE_OTHER): Payer: Medicare Other | Admitting: Cardiovascular Disease

## 2016-05-13 ENCOUNTER — Encounter: Payer: Self-pay | Admitting: Cardiovascular Disease

## 2016-05-13 VITALS — BP 136/72 | HR 66 | Ht 60.0 in | Wt 163.0 lb

## 2016-05-13 DIAGNOSIS — I080 Rheumatic disorders of both mitral and aortic valves: Secondary | ICD-10-CM | POA: Diagnosis not present

## 2016-05-13 DIAGNOSIS — I313 Pericardial effusion (noninflammatory): Secondary | ICD-10-CM

## 2016-05-13 DIAGNOSIS — I3139 Other pericardial effusion (noninflammatory): Secondary | ICD-10-CM

## 2016-05-13 DIAGNOSIS — I4892 Unspecified atrial flutter: Secondary | ICD-10-CM

## 2016-05-13 DIAGNOSIS — R0602 Shortness of breath: Secondary | ICD-10-CM

## 2016-05-13 DIAGNOSIS — R0789 Other chest pain: Secondary | ICD-10-CM

## 2016-05-13 HISTORY — DX: Other chest pain: R07.89

## 2016-05-13 NOTE — Addendum Note (Signed)
Addended by: Venetia Maxon on: 05/13/2016 02:46 PM   Modules accepted: Orders

## 2016-05-13 NOTE — Progress Notes (Signed)
Cardiology Office Note   Date:  05/13/2016   ID:  Mary Farrell, DOB 14-Jul-1945, MRN [id number]  PCP:  Melinda Crutch, MD  Cardiologist:   Skeet Latch, MD   Chief Complaint  Patient presents with  . Shortness of Breath  . Chest Pain  . Dizziness    History of Present Illness: Mary Farrell is a 71 y.o. female with moderate aortic regurgitation, mild-moderate mitral stenosis, chronic pericardial effusion, paroxysmal atrial flutter, and hypertension who presents for follow up.  [name] was previously a patient of Dr. Mare Ferrari.  She followed up with Truitt Merle on 04/2015 due to shortness of breath that she felt was getting worse.  She was referred for an echo 04/2015 that revealed LVEF 55-60% with moderate AR and mild-moderate MS.  There was also a moderate pericardial effusion but no evidence of tamponade.  She had a heart cath 12/1994 that revealed normal coronaries with mild MS and mild pulmonary hypertension.  She also had a Cardiolite in 2007 that was negative for ischemia.  She was seen in clinic 07/2015 and reported persistent shortness of breath with minimal exertion and chest discomfort.  She was referred for exercise Myoview 08/26/15 that revealed LVEF 60% and no perfusion defects.    [name] was admitted 08/2015 with atrial flutter.  She had an echo that showed a persistent moderate-severe pericardial effusion but no tamponade.  She was started on amiodarone, which she did not tolerate due to nausea.  She underwent DCCV on 09/22/15 and then was started on flecainide.  At her last appointment [name]'s blood pressure was elevated so she was started on hydrochlorothiazide.  She had a couple episodes of chest pain and jaw pain.  When it occurs she tries to sit quietly and take Tums.  She is unsure of whether this helps.  The pain is in her left chest and radiates up into her neck.  Typically it occurs at rest and not with exertion.  It is associated with shortness of breath.  She  denies orthopnea or PND, though she sleeps on a wedge for acid reflux.  She used to take omeprazole daily and now alternates it with ranitidine.  She also reports chronic shortness of breath with exertion.  [name] has been checking her BP at home and it has been in the 100s-130s/50-60s.  She hasn't been exercising due to foot and knee pain.  She hopes to start doing floor exercises.    [name] reports palpitations that occur 2-3 times per week.  She is unsure whether it occurs because of anxiety or if the palpitations cause anxiety.   She checks her vitals when it occurs and it is in the 80s.  This has been occurring for the last 6 weeks.  When she takes an extra 1/2 tablet of ativan, which helps.  She is very stressed because her son is in prison and her brother has been sick.   Past Medical History:  Diagnosis Date  . Aortic regurgitation 08/13/2015  . Arthritis    "hands, wrists, back, feet, toes" (09/18/2015)  . Atypical chest pain 05/13/2016  . Chest pain    remote cath in 1996 with NORMAL coronaries noted  . Exogenous obesity    history of   . GERD (gastroesophageal reflux disease)   . Glaucoma, both eyes   . Headache, migraine    "stopped in my 50's" (09/18/2015)  . History of cardiovascular stress test 2007   showing no  ischemia  . Hypertension   . Mitral stenosis with insufficiency   . Murmur    of mitral stenosis and moderate aortic insufficiency      . Palpitations    occasional  . Personal history of rheumatic heart disease   . SBE (subacute bacterial endocarditis)    prophylaxis  . Sliding hiatal hernia     Past Surgical History:  Procedure Laterality Date  . ABDOMINAL HYSTERECTOMY  1975  . APPENDECTOMY  1977  . BUNIONECTOMY Right 2000s  . CARDIAC CATHETERIZATION  01/13/1995   normal coronary anatomy and mild mitral stenosis and mild pulmonary hypertension  . CARDIOVERSION N/A 09/22/2015   Procedure: CARDIOVERSION;  Surgeon: Jerline Pain, MD;  Location: [facility];  Service: Cardiovascular;  Laterality: N/A;  . CATARACT EXTRACTION W/ INTRAOCULAR LENS  IMPLANT, BILATERAL Bilateral 2013  . LAPAROSCOPIC CHOLECYSTECTOMY  1997  . TONSILLECTOMY AND ADENOIDECTOMY  1990s  . UVULOPALATOPHARYNGOPLASTY, TONSILLECTOMY AND SEPTOPLASTY  1990s     Current Outpatient Prescriptions  Medication Sig Dispense Refill  . ALPRAZolam (XANAX) 0.25 MG tablet Take 0.25 mg by mouth as needed for anxiety.    . B Complex Vitamins (VITAMIN B COMPLEX PO) Take 1 capsule by mouth daily.      . Calcium Carbonate (CALCIUM 500 PO) Take 500 mg by mouth daily.      . carvedilol (COREG) 6.25 MG tablet Take 1 tablet (6.25 mg total) by mouth 2 (two) times daily. 180 tablet 3  . cetirizine (ZYRTEC) 10 MG tablet Take 10 mg by mouth at bedtime.    . Cholecalciferol (VITAMIN D) 2000 UNITS CAPS Take 2,000 Units by mouth daily.      Marland Kitchen conjugated estrogens (PREMARIN) vaginal cream Place 1 Applicatorful vaginally daily.    . dorzolamide-timolol (COSOPT) 22.3-6.8 MG/ML ophthalmic solution PLACE 1 DROP INTO EACH EYE DAILY  6  . ELIQUIS 5 MG TABS tablet TAKE 1 TABLET BY MOUTH TWO  TIMES DAILY 180 tablet 0  . flecainide (TAMBOCOR) 50 MG tablet Take 1 tablet (50 mg total) by mouth 2 (two) times daily. 180 tablet 3  . gabapentin (NEURONTIN) 300 MG capsule Take 300 mg by mouth 3 (three) times daily.    Marland Kitchen latanoprost (XALATAN) 0.005 % ophthalmic solution Place 1 drop into both eyes daily.  6  . multivitamin (THERAGRAN) per tablet Take 1 tablet by mouth daily.      [facility] Scarlet [facility]  Anti-Inflammatory Cream- Diclofenac 3%, Baclofen 2%, Lidocaine 2% Apply 1-2 grams to affected area 3-4 times daily Qty. 120 gm 3 refills    . omeprazole (PRILOSEC) 40 MG capsule Take 40 mg by mouth daily.     . ondansetron (ZOFRAN) 4 MG tablet Take 1 tablet (4 mg total) by mouth every 8 (eight) hours as needed for nausea or vomiting. 20 tablet 0  . oxybutynin (DITROPAN-XL) 10 MG 24 hr tablet Take  10 mg by mouth.    . psyllium (METAMUCIL) 58.6 % powder Take 1 packet by mouth daily as needed (for constipation).     . hydrochlorothiazide (MICROZIDE) 12.5 MG capsule Take 1 capsule (12.5 mg total) by mouth daily. 90 capsule 3  . losartan (COZAAR) 100 MG tablet Take 1 tablet (100 mg total) by mouth daily. 90 tablet 3   No current facility-administered medications for this visit.     Allergies:   Metoprolol    Social History:  The patient  reports that she has never smoked. She has never used smokeless tobacco. She reports  that she does not drink alcohol or use drugs.   Family History:  The patient's  family history includes Diabetes in her brother; Heart attack in her mother; Heart failure in her maternal grandfather; Hypertension in her brother; Kidney disease in her brother.    ROS:  Please see the history of present illness.   Otherwise, review of systems are positive for none.   All other systems are reviewed and negative.    PHYSICAL EXAM: VS:  BP 136/72   Pulse 66   Ht 5' (1.524 m)   Wt 73.9 kg (163 lb)   BMI 31.83 kg/m  , BMI Body mass index is 31.83 kg/m. GENERAL:  Well appearing.  No acute distress. HEENT:  Pupils equal round and reactive, fundi not visualized, oral mucosa unremarkable NECK:  JVP 1 cm above the calvicle 45. +HJR.  waveform within normal limits, carotid upstroke brisk and symmetric, no bruits LYMPHATICS:  No cervical adenopathy LUNGS:  Clear to auscultation bilaterally HEART:  RRR.  PMI not displaced or sustained,S1 and S2 within normal limits, +S3, no S4, no clicks, no rubs, no murmurs ABD:  Flat, positive bowel sounds normal in frequency in pitch, no bruits, no rebound, no guarding, no midline pulsatile mass, no hepatomegaly, no splenomegaly EXT:  2 plus pulses throughout, no edema, no cyanosis no clubbing SKIN:  No rashes no nodules NEURO:  Cranial nerves II through XII grossly intact, motor grossly intact throughout PSYCH:  Cognitively intact,  oriented to person place and time   EKG:  EKG is ordered today. The ekg ordered 05/07/15 demonstraSinus bradycardia rate 57 bpm. 05/13/16: Sinus rhythm. Rate 66 bpm.  Echo 09/18/15: Study Conclusions  - Left ventricle: The cavity size was normal. Wall thickness was   increased in a pattern of mild LVH. Systolic function was normal.   The estimated ejection fraction was in the range of 55% to 60%. - Aortic valve: There was mild regurgitation. - Mitral valve: MV is thickened, calcified with restricted motion.   Doppler interrogation was not done on valve to evaluate. Order   limited echo if indicated. Calcified annulus. - Left atrium: The atrium was severely dilated. - Pericardium, extracardiac: Moderate to large pericardial effusion   surrounds heart (12-18 mm) There is no signs of tamponade by echo   criteria. Clinical correlation indidated. Appears slightly more   prominent when compared to echo of April 2017.  Impressions:  - Limited echo Doppler interrogations of valves not done.  Exercise Myoveiw 08/26/15:  The left ventricular ejection fraction is normal (55-65%).  Nuclear stress EF: 60%.  Blood pressure demonstrated a hypertensive response to exercise.  Upsloping ST segment depression ST segment depression of 1 mm was noted during stress in the II, III, V6, V5 and aVF leads.  This is a low risk study.   No reversible ischemia. LVEF 60% with normal wall motion. Fair exercise tolerance. No chest pain. This is a low risk study.   Echo 05/03/15:  Study Conclusions  - Left ventricle: The cavity size was normal. Wall thickness was  increased in a pattern of mild LVH. Systolic function was normal.  The estimated ejection fraction was in the range of 55% to 60%.  Wall motion was normal; there were no regional wall motion  abnormalities. Ratio of mitral valve peak E velocity to average  of medial and lateral annulus peak E velocity: 46.85.  Longitudinal strain,  TDI:- 18 %. - Aortic valve: There was moderate regurgitation. - Mitral valve: Moderately thickened  annulus. The findings are  consistent with mild to moderate stenosis. There was mild  regurgitation. Mean gradient (D): 5 mm Hg. Peak gradient (D): 14  mm Hg. Valve area by pressure half-time: 1.33 cm^2. Valve area by  continuity equation (using LVOT flow): 0.94 cm^2. - Left atrium: The atrium was severely dilated. - Right ventricle: The cavity size was mildly decreased. - Pericardium, extracardiac: A moderate pericardial effusion was  identified circumferential to the heart. There was no evidence of  hemodynamic compromise.  Recent Labs: 09/18/2015: TSH 1.560 09/19/2015: ALT 24 09/20/2015: Hemoglobin 14.4; Magnesium 2.3; Platelets 177 09/21/2015: B Natriuretic Peptide 256.2 12/02/2015: BUN 15; Creat 0.86; Potassium 4.3; Sodium 141    Lipid Panel No results found for: CHOL, TRIG, HDL, CHOLHDL, VLDL, LDLCALC, LDLDIRECT    Wt Readings from Last 3 Encounters:  05/13/16 73.9 kg (163 lb)  11/20/15 73.6 kg (162 lb 3.2 oz)  10/21/15 72.8 kg (160 lb 9.6 oz)      ASSESSMENT AND PLAN:  # Atypical chest pain: # Shortness of breath: # Pericardial effusion:   Ms. Rena had a LHC in 1996 that did not reveal obstructive coronary disease and her stress in 2017 was negative for ischemia.  Her symptoms are atypical and may be more related to gastroesophageal reflux disease, especially given that she is minimizing the use of her PPI. She does have mild JVD on exam, though no lower extremity edema. Given her known pleural effusion we will obtain a repeat echocardiogram. Consider a trial of colchicine for pericarditis if her effusion is still present.  # Mild-moderate mitral stenosis: # Moderate AR: # Rheumatic heart disease: Echo 10/22/15 revealed mild to moderate mitral stenosis and mild mitral regurgitation.  The mean gradient was 6 mmHg with a valve area of 1.67 cm  # Hypertension: Blood  pressure is much better controlled after adding hydrochlorothiazide.  Continue carvedilol, losartan, and hydrochlorothiazide.  # Paroxysmal Atrial flutter: Ms. Connors was very symptomatic in atrial flutter and is very concerned about going back into atrial flutter.   She notes occasional fluttering but it is not sustained. We discussed wearing an ambulatory monitor but she is interested at this time.  Continue carvedilol, flecainide, and Eliquis.     Current medicines are reviewed at length with the patient today.  The patient does not have concerns regarding medicines.  The following changes have been made:  None  Labs/ tests ordered today include:   Orders Placed This Encounter  Procedures  . EKG 12-Lead  . ECHOCARDIOGRAM COMPLETE     Disposition:   FU with Daviona Herbert C. Oval Linsey, MD, [facility] in 4 months   This note was written with the assistance of speech recognition software.  Please excuse any transcriptional errors.  Signed, Betti Goodenow C. Oval Linsey, MD, [facility]  05/13/2016 9:40 AM    [place]

## 2016-05-13 NOTE — Patient Instructions (Signed)
Medication Instructions:  Your physician recommends that you continue on your current medications as directed. Please refer to the Current Medication list given to you today.  Labwork: none  Testing/Procedures: Your physician has requested that you have an echocardiogram. Echocardiography is a painless test that uses sound waves to create images of your heart. It provides your doctor with information about the size and shape of your heart and how well your heart's chambers and valves are working. This procedure takes approximately one hour. There are no restrictions for this procedure. [place] [address]  Follow-Up: Your physician wants you to follow-up in: [place] will receive a reminder letter in the mail two months in advance. If you don't receive a letter, please call our office to schedule the follow-up appointment.  If you need a refill on your cardiac medications before your next appointment, please call your pharmacy.

## 2016-05-27 ENCOUNTER — Ambulatory Visit (INDEPENDENT_AMBULATORY_CARE_PROVIDER_SITE_OTHER): Payer: Medicare Other

## 2016-05-27 ENCOUNTER — Encounter: Payer: Self-pay | Admitting: Podiatry

## 2016-05-27 ENCOUNTER — Ambulatory Visit (INDEPENDENT_AMBULATORY_CARE_PROVIDER_SITE_OTHER): Payer: Medicare Other | Admitting: Podiatry

## 2016-05-27 ENCOUNTER — Ambulatory Visit: Payer: Medicare Other

## 2016-05-27 DIAGNOSIS — M2041 Other hammer toe(s) (acquired), right foot: Secondary | ICD-10-CM

## 2016-05-27 DIAGNOSIS — M779 Enthesopathy, unspecified: Secondary | ICD-10-CM | POA: Diagnosis not present

## 2016-05-28 ENCOUNTER — Other Ambulatory Visit: Payer: Self-pay

## 2016-05-28 ENCOUNTER — Ambulatory Visit (HOSPITAL_COMMUNITY): Payer: Medicare Other | Attending: Cardiology

## 2016-05-28 DIAGNOSIS — R0602 Shortness of breath: Secondary | ICD-10-CM | POA: Diagnosis not present

## 2016-05-28 DIAGNOSIS — I313 Pericardial effusion (noninflammatory): Secondary | ICD-10-CM | POA: Diagnosis present

## 2016-05-28 DIAGNOSIS — I050 Rheumatic mitral stenosis: Secondary | ICD-10-CM | POA: Diagnosis not present

## 2016-05-28 DIAGNOSIS — I3139 Other pericardial effusion (noninflammatory): Secondary | ICD-10-CM

## 2016-05-29 NOTE — Progress Notes (Signed)
Subjective:    Patient ID: Mary Farrell, female   DOB: 71 y.o.   MRN: [id number]   HPI patient states she's doing quite a bit better with diminishment of discomfort    ROS      Objective:  Physical Exam neurovascular status intact negative Homans sign was noted with diminished edema in the right fourth digit with toe in a derotated position     Assessment:     Doing well post arthroplasty right    Plan:    Reviewed x-rays and advised on open toed shoes and wider shoes with gradual increase in activity and shoe gear usage  X-ray report indicates the satisfactory position of the toe was present with good resection of bone and no indications of pathology

## 2016-06-07 ENCOUNTER — Ambulatory Visit (INDEPENDENT_AMBULATORY_CARE_PROVIDER_SITE_OTHER): Payer: Medicare Other | Admitting: Family Medicine

## 2016-06-07 ENCOUNTER — Encounter: Payer: Self-pay | Admitting: Family Medicine

## 2016-06-07 VITALS — BP 112/69 | HR 69 | Temp 97.7°F | Resp 18 | Ht 59.84 in | Wt 164.4 lb

## 2016-06-07 DIAGNOSIS — R21 Rash and other nonspecific skin eruption: Secondary | ICD-10-CM | POA: Diagnosis not present

## 2016-06-07 MED ORDER — METHYLPREDNISOLONE ACETATE 80 MG/ML IJ SUSP
80.0000 mg | Freq: Once | INTRAMUSCULAR | Status: AC
  Administered 2016-06-07: 80 mg via INTRAMUSCULAR

## 2016-06-07 NOTE — Progress Notes (Signed)
  Mary Farrell - 71 y.o. female MRN [id number]  Date of birth: [birth year]  SUBJECTIVE:  Including CC & ROS.  Chief Complaint  Patient presents with  . Rash    X 1 day- all over body    Mary Farrell is 71 yo F that is presenting with rash. She reports it started yesterday. It was hot and burning sensation on her back. She applied topical steroid but didn't help. She hasn't changed any shampoo or detergent. Feels the same today. Is very itchy. Has been sick recently. No travel. No new animals or pets. No one she has been around with rash. No new medications. She use to get steroid injections for her eczema but hasn't received one in over 30 years.   ROS: No unexpected weight loss, fever, chills, swelling, instability, muscle pain, numbness/tingling, redness, otherwise see HPI   HISTORY: Past Medical, Surgical, Social, and Family History Reviewed & Updated per EMR.   Pertinent Historical Findings include: PMHx: Afib, moderate mitral valve stenosis. Aortic insufficiency, GERD, vulvodynia, HTN, hemorrhoids   Surgical:  Cholecystectomy, hysterectomy  Social:  No tobacco or alcohol use FHx: Dm2, CAD, CHF, kidney stones   DATA REVIEWED: none  PHYSICAL EXAM:  VS: BP 112/69 (BP Location: Right Arm, Patient Position: Sitting, Cuff Size: Large)   Pulse 69   Temp 97.7 F (36.5 C) (Oral)   Resp 18   Ht 4' 11.84" (1.52 m)   Wt 164 lb 6.4 oz (74.6 kg)   SpO2 95%   BMI 32.28 kg/m  PHYSICAL EXAM: Gen: NAD, alert, cooperative with exam,  HEENT: NCAT, EOMI, clear conjunctiva, oropharynx clear, supple neck CV: RRR, good S1/S2, no murmur, no edema, capillary refill brisk  Resp: CTABL, no wheezes, non-labored Abd: SNTND, BS present, no guarding or organomegaly Skin: No obvious rash on her back but is itchy. Some erythematous papules on the left mid eczema region. Neuro: no gross deficits.  Psych: good insight, alert and oriented   ASSESSMENT & PLAN:   Rash The rash seems to be consistent with a  contact dermatitis. Unclear as the origin of this rash. She does have a history of eczema and does not appear to be eczema - Depo IM 80 mg. - She can continue taking antihistamine for itching. - Advised to follow-up with her improvement.

## 2016-06-07 NOTE — Patient Instructions (Addendum)
Thank you for coming in,   Please take an anti-histamine for the itching.   Please try to avoid any triggers.   Please follow up if your symptoms fail to improve.    Please feel free to call with any questions or concerns at any time, at [phone]. --Dr. Hart [place] Dermatitis Dermatitis is redness, soreness, and swelling (inflammation) of the skin. Contact dermatitis is a reaction to certain substances that touch the skin. You either touched something that irritated your skin, or you have allergies to something you touched. Follow these instructions at home: [place] your skin as needed.  Apply cool compresses to the affected areas.  Try taking a bath with:  Epsom salts. Follow the instructions on the package. You can get these at a pharmacy or grocery store.  Baking soda. Pour a small amount into the bath as told by your doctor.  Colloidal oatmeal. Follow the instructions on the package. You can get this at a pharmacy or grocery store.  Try applying baking soda paste to your skin. Stir water into baking soda until it looks like paste.  Do not scratch your skin.  Bathe less often.  Bathe in lukewarm water. Avoid using hot water. Medicines   Take or apply over-the-counter and prescription medicines only as told by your doctor.  If you were prescribed an antibiotic medicine, take or apply your antibiotic as told by your doctor. Do not stop taking the antibiotic even if your condition starts to get better. General instructions   Keep all follow-up visits as told by your doctor. This is important.  Avoid the substance that caused your reaction. If you do not know what caused it, keep a journal to try to track what caused it. Write down:  What you eat.  What cosmetic products you use.  What you drink.  What you wear in the affected area. This includes jewelry.  If you were given a bandage (dressing), take care of it as told by your doctor.  This includes when to change and remove it. Contact a doctor if:  You do not get better with treatment.  Your condition gets worse.  You have signs of infection such as:  Swelling.  Tenderness.  Redness.  Soreness.  Warmth.  You have a fever.  You have new symptoms. Get help right away if:  You have a very bad headache.  You have neck pain.  Your neck is stiff.  You throw up (vomit).  You feel very sleepy.  You see red streaks coming from the affected area.  Your bone or joint underneath the affected area becomes painful after the skin has healed.  The affected area turns darker.  You have trouble breathing. This information is not intended to replace advice given to you by your health care provider. Make sure you discuss any questions you have with your health care provider. Document Released: 11/08/2008 Document Revised: 06/19/2015 Document Reviewed: 05/29/2014 Elsevier Interactive Patient Education  2017 [employer].   IF you received an x-ray today, you will receive an invoice from [facility] Radiology. Please contact [facility] Radiology at [phone] with questions or concerns regarding your invoice.   IF you received labwork today, you will receive an invoice from [place]. Please contact [facility] at [phone] with questions or concerns regarding your invoice.   Our billing staff will not be able to assist you with questions regarding bills from these companies.  You will be contacted with the lab  results as soon as they are available. The fastest way to get your results is to activate your My Chart account. Instructions are located on the last page of this paperwork. If you have not heard from [place] regarding the results in 2 weeks, please contact this office.

## 2016-06-08 DIAGNOSIS — R21 Rash and other nonspecific skin eruption: Secondary | ICD-10-CM | POA: Insufficient documentation

## 2016-06-08 NOTE — Assessment & Plan Note (Signed)
The rash seems to be consistent with a contact dermatitis. Unclear as the origin of this rash. She does have a history of eczema and does not appear to be eczema - Depo IM 80 mg. - She can continue taking antihistamine for itching. - Advised to follow-up with her improvement.

## 2016-06-09 ENCOUNTER — Other Ambulatory Visit: Payer: Self-pay | Admitting: Nurse Practitioner

## 2016-06-09 ENCOUNTER — Other Ambulatory Visit: Payer: Self-pay | Admitting: Cardiovascular Disease

## 2016-06-09 NOTE — Telephone Encounter (Signed)
Please review for refill. Thanks!  

## 2016-08-11 ENCOUNTER — Ambulatory Visit (INDEPENDENT_AMBULATORY_CARE_PROVIDER_SITE_OTHER): Payer: Medicare Other | Admitting: *Deleted

## 2016-08-11 ENCOUNTER — Telehealth: Payer: Self-pay | Admitting: Cardiovascular Disease

## 2016-08-11 ENCOUNTER — Encounter: Payer: Self-pay | Admitting: *Deleted

## 2016-08-11 VITALS — BP 110/61 | HR 73 | Ht 60.0 in

## 2016-08-11 DIAGNOSIS — I4892 Unspecified atrial flutter: Secondary | ICD-10-CM | POA: Diagnosis not present

## 2016-08-11 DIAGNOSIS — I480 Paroxysmal atrial fibrillation: Secondary | ICD-10-CM

## 2016-08-11 NOTE — Telephone Encounter (Signed)
New Message   pt verbalized that she is calling for rn she feels better now her hr was high she   could feel it and she took a half a xanax and she said she feels better   She just wants a rn to be aware   Patient c/o Palpitations:  High priority if patient c/o lightheadedness and shortness of breath.  1. How long have you been having palpitations? Today 9am  2. Are you currently experiencing lightheadedness and shortness of breath? no 3. Have you checked your BP and heart rate? (document readings)       108/70     Hr 162    140/77   Hr   92 4. Are you experiencing any other symptoms? no

## 2016-08-11 NOTE — Telephone Encounter (Signed)
Returned call to patient-patient reports episode of palpitations this morning. Reports she was taking a bath and noticed her heart was "racing" and was short of breath.  Reports getting out of the bath and taking BP and HR and it was 107/78 HR 162.   Reports she had just taken her morning medication.  Reports sitting down to rest, took 1/2 xanex and HR decreased.   Now HR is 86 but still "irregular".   Reports under a lot of stress lately.   Denies CP, diaphoresis, dizziness.   Patient requesting to come in for EKG and BP check.    Per chart review: hx PAF, patient was having palpitations, offered monitor and patient denied.  Patient continues to express she is not interested in monitor.  Patient on eliquis, coreg, flecainide.     Spoke to Dr. Oval Linsey, ok to bring in for nurse EKG.    Scheduled at 3:00 Pm at [facility].  Patient aware and verbalized understanding.

## 2016-08-11 NOTE — Patient Instructions (Signed)
Medication Instructions:  Ok to use an extra Flecainide as needed for    Labwork: NONE  Testing/Procedures: NONE  Follow-Up: KEEP SCHEDULED APPOINTMENT   Any Other Special Instructions Will Be Listed Below (If Applicable). IF YOU HAVE ANY RECURRENT EPISODES BETWEEN NOW AND YOUR FOLLOW UP CALL THE OFFICE AND WILL ARRANGE FOR YOU TO HAVE A 30 DAY EVENT MONITOR   If you need a refill on your cardiac medications before your next appointment, please call your pharmacy.

## 2016-08-11 NOTE — Progress Notes (Signed)
1.) Reason for visit: Patient in office today for EKG to evaluate heart rate/rhythm   2.) Name of MD requesting visit: Skeet Latch, MD  3.) H&P: Patient had episode of elevated heart rate and blood pressure this am that lasted for a while. Blood pressure and heart rates 108/70  Hr 162  140/77   Hr 92. Her blood pressure machine alerted her that irregular rhythm was detected. Stated she was feeling short of breath during the heart rate of 162. She called in and requested to come to office for EKG. EKG was done and reviewed by Dr Oval Linsey, NSR. Blood pressure 116/61 HR 73 at visit. She has not had episode like this before but can feel heart "skip" at times. She is under increased stress and realizes this, will try harder to relax.   4.) Assessment and plan per MD: Discussed with Dr Oval Linsey and ok to use an extra Flecainide once daily as needed for flutter/fib. Offered 30 day event, still declines at this time but will call back if any further episodes prior to follow up visit with Dr Oval Linsey 09/09/16.

## 2016-09-03 ENCOUNTER — Other Ambulatory Visit: Payer: Self-pay | Admitting: Cardiovascular Disease

## 2016-09-03 NOTE — Telephone Encounter (Signed)
Please review for refill, Thanks !  

## 2016-09-09 ENCOUNTER — Ambulatory Visit (INDEPENDENT_AMBULATORY_CARE_PROVIDER_SITE_OTHER): Payer: Medicare Other | Admitting: Cardiovascular Disease

## 2016-09-09 VITALS — BP 112/66 | HR 64 | Ht 60.0 in | Wt 158.2 lb

## 2016-09-09 DIAGNOSIS — R0789 Other chest pain: Secondary | ICD-10-CM | POA: Diagnosis not present

## 2016-09-09 DIAGNOSIS — I10 Essential (primary) hypertension: Secondary | ICD-10-CM

## 2016-09-09 DIAGNOSIS — R0602 Shortness of breath: Secondary | ICD-10-CM

## 2016-09-09 DIAGNOSIS — I3139 Other pericardial effusion (noninflammatory): Secondary | ICD-10-CM

## 2016-09-09 DIAGNOSIS — I313 Pericardial effusion (noninflammatory): Secondary | ICD-10-CM

## 2016-09-09 DIAGNOSIS — I4892 Unspecified atrial flutter: Secondary | ICD-10-CM | POA: Diagnosis not present

## 2016-09-09 NOTE — Progress Notes (Signed)
Cardiology Office Note  Date:  09/09/2016   ID:  Mary Farrell, DOB 16-Mar-1945, MRN [id number]  PCP:  Lawerance Cruel, MD  Cardiologist:   Skeet Latch, MD   Chief Complaint  Patient presents with  . Follow-up    pt c/o chest tightness    History of Present Illness: Mary Farrell is a 71 y.o. female with moderate aortic regurgitation, mild-moderate mitral stenosis, chronic pericardial effusion, paroxysmal atrial flutter, and hypertension who presents for follow up.  Mary Farrell was previously a patient of Dr. Mare Farrell.  She followed up with Mary Farrell on 04/2015 due to shortness of breath that she felt was getting worse.  She was referred for an echo 04/2015 that revealed LVEF 55-60% with moderate AR and mild-moderate MS.  There was also a moderate pericardial effusion but no evidence of tamponade.  She had a heart cath 12/1994 that revealed normal coronaries with mild MS and mild pulmonary hypertension.  She also had a Cardiolite in 2007 that was negative for ischemia.  She was seen in clinic 07/2015 and reported persistent shortness of breath with minimal exertion and chest discomfort.  She was referred for exercise Myoview 08/26/15 that revealed LVEF 60% and no perfusion defects.    Mary Farrell was admitted 08/2015 with atrial flutter.  She had an echo that showed a persistent moderate-severe pericardial effusion but no tamponade.  She was started on amiodarone, which she did not tolerate due to nausea.  She underwent DCCV on 09/22/15 and then was started on flecainide.  At her last appointment she continued to report shortness of breath and was again referred for an echo that revealed LVEF 60-65% with grade 2 diastolic dysfunction, mild aortic regurgitation, and moderate mitral stenosis. She had a moderate pericardial effusion localized to the inferior and inferolateral walls. There was no evidence of tamponade.  Since her last appointment Mary Farrell has been doing OK.  She had an episode  of heart racing while sitting in the tub.  Her heart monitor was 162 bpm.  It improved after 30 minutes.  She came to clinic for an EKG but the episode was over by the time she got here.  Otherwise she hasn't experienced any episodes.  She started Weight [employer] and has been trying to exercise.  She has occasional episodes of chest tightness that last approximately one minute.  This is mostly when she is feeling stressed but not with exertion.  Her son is up for parole next month and she is trying to help get his paperwork in order.  She brings a log of her blood pressures that show they've been mostly in the 623J to 628B systolic.  She struggles to wear her CPAP every night.    Past Medical History:  Diagnosis Date  . Aortic regurgitation 08/13/2015  . Arthritis    "hands, wrists, back, feet, toes" (09/18/2015)  . Atypical chest Farrell 05/13/2016  . Chest Farrell    remote cath in 1996 with NORMAL coronaries noted  . Exogenous obesity    history of   . GERD (gastroesophageal reflux disease)   . Glaucoma, both eyes   . Headache, migraine    "stopped in my 50's" (09/18/2015)  . History of cardiovascular stress test 2007   showing no ischemia  . Hypertension   . Mitral stenosis with insufficiency   . Murmur    of mitral stenosis and moderate aortic insufficiency      . Palpitations    occasional  .  Personal history of rheumatic heart disease   . SBE (subacute bacterial endocarditis)    prophylaxis  . Sliding hiatal hernia     Past Surgical History:  Procedure Laterality Date  . ABDOMINAL HYSTERECTOMY  1975  . APPENDECTOMY  1977  . BUNIONECTOMY Right 2000s  . CARDIAC CATHETERIZATION  01/13/1995   normal coronary anatomy and mild mitral stenosis and mild pulmonary hypertension  . CARDIOVERSION N/A 09/22/2015   Procedure: CARDIOVERSION;  Surgeon: Mary Pain, MD;  Location: [facility];  Service: Cardiovascular;  Laterality: N/A;  . CATARACT EXTRACTION W/ INTRAOCULAR LENS  IMPLANT,  BILATERAL Bilateral 2013  . LAPAROSCOPIC CHOLECYSTECTOMY  1997  . TONSILLECTOMY AND ADENOIDECTOMY  1990s  . UVULOPALATOPHARYNGOPLASTY, TONSILLECTOMY AND SEPTOPLASTY  1990s     Current Outpatient Prescriptions  Medication Sig Dispense Refill  . doxepin (SINEQUAN) 10 MG capsule Take 10 mg by mouth at bedtime.    . ALPRAZolam (XANAX) 0.25 MG tablet Take 0.25 mg by mouth as needed for anxiety.    . B Complex Vitamins (VITAMIN B COMPLEX PO) Take 1 capsule by mouth daily.      . Calcium Carbonate (CALCIUM 500 PO) Take 500 mg by mouth daily.      . carvedilol (COREG) 6.25 MG tablet TAKE 1 TABLET BY MOUTH TWO  TIMES DAILY 180 tablet 1  . cetirizine (ZYRTEC) 10 MG tablet Take 5 mg by mouth at bedtime.     . Cholecalciferol (VITAMIN D) 2000 UNITS CAPS Take 2,000 Units by mouth daily.      Marland Kitchen conjugated estrogens (PREMARIN) vaginal cream Place 1 Applicatorful vaginally daily.    . dorzolamide-timolol (COSOPT) 22.3-6.8 MG/ML ophthalmic solution PLACE 1 DROP INTO EACH EYE DAILY  6  . ELIQUIS 5 MG TABS tablet TAKE 1 TABLET BY MOUTH TWO  TIMES DAILY 180 tablet 1  . flecainide (TAMBOCOR) 50 MG tablet TAKE 1 TABLET BY MOUTH TWO  TIMES DAILY 180 tablet 1  . gabapentin (NEURONTIN) 300 MG capsule Take 300 mg by mouth 3 (three) times daily.    . hydrochlorothiazide (MICROZIDE) 12.5 MG capsule Take 1 capsule (12.5 mg total) by mouth daily. 90 capsule 3  . latanoprost (XALATAN) 0.005 % ophthalmic solution Place 1 drop into both eyes daily.  6  . losartan (COZAAR) 100 MG tablet Take 1 tablet (100 mg total) by mouth daily. 90 tablet 3  . multivitamin (THERAGRAN) per tablet Take 1 tablet by mouth daily.      Marland Kitchen omeprazole (PRILOSEC) 40 MG capsule Take 40 mg by mouth daily.     . ondansetron (ZOFRAN) 4 MG tablet Take 1 tablet (4 mg total) by mouth every 8 (eight) hours as needed for nausea or vomiting. 20 tablet 0  . oxybutynin (DITROPAN-XL) 10 MG 24 hr tablet Take 10 mg by mouth.    . psyllium (METAMUCIL) 58.6 %  powder Take 1 packet by mouth daily as needed (for constipation).      No current facility-administered medications for this visit.     Allergies:   Metoprolol    Social History:  The patient  reports that she has never smoked. She has never used smokeless tobacco. She reports that she does not drink alcohol or use drugs.   Family History:  The patient's  family history includes Diabetes in her brother; Heart attack in her mother; Heart failure in her maternal grandfather; Hypertension in her brother; Kidney disease in her brother.    ROS:  Please see the history of present illness.  Otherwise, review of systems are positive for none.   All other systems are reviewed and negative.    PHYSICAL EXAM: VS:  BP 112/66   Pulse 64   Ht 5' (1.524 m)   Wt 71.8 kg (158 lb 3.2 oz)   BMI 30.90 kg/m  , BMI Body mass index is 30.9 kg/m. GENERAL:  Well appearing HEENT: Pupils equal round and reactive, fundi not visualized, oral mucosa unremarkable NECK:  No jugular venous distention, waveform within normal limits, carotid upstroke brisk and symmetric, no bruits LUNGS:  Clear to auscultation bilaterally HEART:  RRR.  PMI not displaced or sustained,S1 and S2 within normal limits, no S3, no S4, no clicks, no rubs, no murmurs ABD:  Flat, positive bowel sounds normal in frequency in pitch, no bruits, no rebound, no guarding, no midline pulsatile mass, no hepatomegaly, no splenomegaly EXT:  2 plus pulses throughout, no edema, no cyanosis no clubbing SKIN:  No rashes no nodules NEURO:  Cranial nerves II through XII grossly intact, motor grossly intact throughout PSYCH:  Cognitively intact, oriented to person place and time   EKG:  EKG is ordered today. The ekg ordered 05/07/15 demonstraSinus bradycardia rate 57 bpm. 05/13/16: Sinus rhythm. Rate 66 bpm. 09/10/78: Sinus rhythm. Rate 64 bpm.  .  Echo 09/18/15: Study Conclusions  - Left ventricle: The cavity size was normal. Wall thickness was    increased in a pattern of mild LVH. Systolic function was normal.   The estimated ejection fraction was in the range of 55% to 60%. - Aortic valve: There was mild regurgitation. - Mitral valve: MV is thickened, calcified with restricted motion.   Doppler interrogation was not done on valve to evaluate. Order   limited echo if indicated. Calcified annulus. - Left atrium: The atrium was severely dilated. - Pericardium, extracardiac: Moderate to large pericardial effusion   surrounds heart (12-18 mm) There is no signs of tamponade by echo   criteria. Clinical correlation indidated. Appears slightly more   prominent when compared to echo of April 2017.  Impressions:  - Limited echo Doppler interrogations of valves not done.  Exercise Myoveiw 08/26/15:  The left ventricular ejection fraction is normal (55-65%).  Nuclear stress EF: 60%.  Blood pressure demonstrated a hypertensive response to exercise.  Upsloping ST segment depression ST segment depression of 1 mm was noted during stress in the II, III, V6, V5 and aVF leads.  This is a low risk study.   No reversible ischemia. LVEF 60% with normal wall motion. Fair exercise tolerance. No chest Farrell. This is a low risk study.  Echo 05/28/16: Study Conclusions  - Left ventricle: The cavity size was normal. There was mild   concentric hypertrophy. Systolic function was normal. The   estimated ejection fraction was in the range of 60% to 65%. Wall   motion was normal; there were no regional wall motion   abnormalities. Features are consistent with a pseudonormal left   ventricular filling pattern, with concomitant abnormal relaxation   and increased filling pressure (grade 2 diastolic dysfunction).   Doppler parameters are consistent with elevated ventricular   end-diastolic filling pressure. - Aortic valve: Trileaflet; mildly thickened leaflets. There was   mild regurgitation. - Aortic root: The aortic root was normal in size. -  Mitral valve: Calcified annulus. Moderately thickened, moderately   calcified leaflets . The findings are consistent with moderate   stenosis. There was mild regurgitation. Mean gradient (D): 6 mm   Hg. Peak gradient (D): 12 mm  Hg. - Left atrium: The atrium was severely dilated. - Right ventricle: Systolic function was normal. - Tricuspid valve: There was mild regurgitation. - Inferior vena cava: The vessel was normal in size. - Pericardium, extracardiac: Features were not consistent with   tamponade physiology.  Impressions:  - LVEF 60-65%.   There was moderate localized pericardial effusion around the   inferior and inferolateral walls unchanged from prior exam on   10/22/15. No evidence for tamponade.   Mitral valve is thickened and calcified, appears rheumatic with   anterior leaflet bowing. There is moderate mitral stenosis and   midl regurgitation.  Recent Labs: 09/18/2015: TSH 1.560 09/19/2015: ALT 24 09/20/2015: Hemoglobin 14.4; Magnesium 2.3; Platelets 177 09/21/2015: B Natriuretic Peptide 256.2 12/02/2015: BUN 15; Creat 0.86; Potassium 4.3; Sodium 141   07/05/16: Total cholesterol 167, triglycerides 159, HDL 47, LDL 88 TSH 2.2 Sodium 141, potassium 3.8, BUN 14, creatinine 0.77 AST 13, ALT 14  Lipid Panel No results found for: CHOL, TRIG, HDL, CHOLHDL, VLDL, LDLCALC, LDLDIRECT    Wt Readings from Last 3 Encounters:  09/09/16 71.8 kg (158 lb 3.2 oz)  06/07/16 74.6 kg (164 lb 6.4 oz)  05/13/16 73.9 kg (163 lb)     ASSESSMENT AND PLAN:  # Atypical chest Farrell: # Shortness of breath: # Pericardial effusion:   Mary Farrell had a LHC in 1996 that did not reveal obstructive coronary disease and her stress in 2017 was negative for ischemia.  Her symptoms are atypical and may be more related to gastroesophageal reflux disease.  It also seems to happen mostly with stress.  No ischemia work up for now.  Effusion was unchanged from prior.   # Mild-moderate mitral stenosis: #  Moderate AR: # Rheumatic heart disease: Mean gradient 6 mmHg 05/2016.  # Hypertension: Blood pressure is well-controlled.  Continue carvedilol, losartan, and HCTZ.  # Paroxysmal Atrial flutter: Mary Farrell was very symptomatic in atrial flutter.  She had one episode since her last appointment.  If she has a recurrent event she can take an extra dose of flecainide.  Continue carvedilol, flecainide, and Eliquis. She was encouraged to wear her CPAP regularly and continue weight loss efforts.    Current medicines are reviewed at length with the patient today.  The patient does not have concerns regarding medicines.  The following changes have been made:  None  Labs/ tests ordered today include:   No orders of the defined types were placed in this encounter.    Disposition:   FU with Mary Kloth C. Oval Linsey, MD, [facility] in 4 months   This note was written with the assistance of speech recognition software.  Please excuse any transcriptional errors.  Signed, Arda Daggs C. Oval Linsey, MD, [facility]  09/09/2016 6:18 PM    [place]

## 2016-09-09 NOTE — Patient Instructions (Signed)
Medication Instructions:  Your physician recommends that you continue on your current medications as directed. Please refer to the Current Medication list given to you today.  Labwork: none  Testing/Procedures: none  Follow-Up: Your physician recommends that you schedule a follow-up appointment in: 4 month ov   If you need a refill on your cardiac medications before your next appointment, please call your pharmacy.  

## 2016-09-29 ENCOUNTER — Encounter: Payer: Self-pay | Admitting: Podiatry

## 2016-09-29 ENCOUNTER — Ambulatory Visit (INDEPENDENT_AMBULATORY_CARE_PROVIDER_SITE_OTHER): Payer: Medicare Other | Admitting: Podiatry

## 2016-09-29 DIAGNOSIS — M19079 Primary osteoarthritis, unspecified ankle and foot: Secondary | ICD-10-CM

## 2016-10-03 MED ORDER — DEXAMETHASONE SODIUM PHOSPHATE 120 MG/30ML IJ SOLN
2.0000 mg | Freq: Once | INTRAMUSCULAR | Status: AC
  Administered 2016-09-29: 2 mg via INTRA_ARTICULAR

## 2016-10-03 NOTE — Progress Notes (Signed)
   Subjective:    Patient ID: Mary Farrell, female    DOB: 1945-05-07, 71 y.o.   MRN: [id number] Chief Complaint  Patient presents with  . Foot Problem    pain starts on top and goes through the foot when i step on my left foot     HPI 71 y.o. female presents with the above complaint. Reports pain in the middle of her foot on the top. States she has injections in the past which made the pain go away. Is headed on a trip soon where she knows she will be doing a lot of activity and requests an injection. Denies other issues.  Review of Systems     Objective:   Physical Exam There were no vitals filed for this visit. General AA&O x3. Normal mood and affect.  Vascular Dorsalis pedis and posterior tibial pulses  present 2+ bilaterally  Capillary refill normal to all digits. Pedal hair growth normal.  Neurologic Epicritic sensation grossly present.  Dermatologic No open lesions. Interspaces clear of maceration. Nails well groomed and normal in appearance.  Orthopedic: MMT 5/5 in dorsiflexion, plantarflexion, inversion, and eversion. POP L dorsal midfoot with prominent osteophytes noted.     Assessment & Plan:  Midfoot Arthritis, L -Educated on etiology -Injection delivered to L midfoot as below.  Procedure: Joint Injection Location: Left midfoot joints Skin Prep: Alcohol. Injectate: 0.5 cc -0.5% marcaine plain, 0.5 cc dexamethasone phosphate. Disposition: Patient tolerated procedure well. Injection site dressed with a band-aid.  Return in about 3 months (around 12/29/2016).

## 2016-10-15 ENCOUNTER — Telehealth: Payer: Self-pay | Admitting: Cardiovascular Disease

## 2016-10-15 NOTE — Telephone Encounter (Signed)
She is welcome to come for an EKG check.

## 2016-10-15 NOTE — Telephone Encounter (Signed)
Pt of Dr. Oval Linsey  Reporting higher than typical HRs for the last week.  She states her HR is typically in the 60s, but for 1 week has been in the 80s in the morning when she checks (obtains reading on an automated [employer] wrist BP cuff). She had a rate of 101 1st thing this morning, she took her reading later after she took AM meds and it was 86.  She also states she took a xanax today.  She states readings for her BPs have been 104-106/60s. Notes HR does not read "irregular" on the cuff.  She denies shortness of breath, fatigue, dizziness, lightheadedness.  States she got a shingles vaccine (2nd round) last Friday & that her head has felt "hot" sometimes, other than this, no symptoms.  Routed to Dr. Oval Linsey to advise. Will f/u w any advice, for now recommended pt continue to check BPs and take medication as prescribed.

## 2016-10-15 NOTE — Telephone Encounter (Signed)
Pt says she been waking up with a high heart rate,it is not irregular. It have been been as high as 101 this morning,at present it is 86.This is very high for her,please call to advise.

## 2016-10-18 ENCOUNTER — Ambulatory Visit (INDEPENDENT_AMBULATORY_CARE_PROVIDER_SITE_OTHER): Payer: Medicare Other | Admitting: Physician Assistant

## 2016-10-18 ENCOUNTER — Encounter: Payer: Self-pay | Admitting: Physician Assistant

## 2016-10-18 VITALS — BP 102/67 | HR 83 | Ht 60.0 in | Wt 162.2 lb

## 2016-10-18 DIAGNOSIS — I4892 Unspecified atrial flutter: Secondary | ICD-10-CM

## 2016-10-18 DIAGNOSIS — Z7901 Long term (current) use of anticoagulants: Secondary | ICD-10-CM

## 2016-10-18 DIAGNOSIS — R0609 Other forms of dyspnea: Secondary | ICD-10-CM

## 2016-10-18 DIAGNOSIS — I480 Paroxysmal atrial fibrillation: Secondary | ICD-10-CM | POA: Diagnosis not present

## 2016-10-18 MED ORDER — FLECAINIDE ACETATE 50 MG PO TABS: 100.0000 mg | ORAL_TABLET | Freq: Two times a day (BID) | ORAL | 1 refills | Status: DC

## 2016-10-18 NOTE — Patient Instructions (Signed)
Medication Instructions:  INCREASE FLECAINIDE 100MG  TWICE DAILY-CALL WHEN YOU NEED REFILL If you need a refill on your cardiac medications before your next appointment, please call your pharmacy.  Follow-Up: Your physician wants you to follow-up in: [place], PA-C   Thank you for choosing [facility] at [facility]!!

## 2016-10-18 NOTE — Telephone Encounter (Signed)
New Message   pt verbalized that she is returning call for RN  She said over the weekend as directed she is to call in   and give her hr results 103 10-18-2016 at 7:45am and 107 10-17-2016 at 10pm

## 2016-10-18 NOTE — Progress Notes (Addendum)
Cardiology Office Note   Date:  10/18/2016   ID:  Mary Farrell, DOB [birth year], MRN [id number]  PCP:  Lawerance Cruel, MD  Cardiologist:  Dr Oval Linsey 09/09/2016  [name], PA-C 09/2015   History of Present Illness: Mary Farrell is a 71 y.o. female with a history of moderate aortic regurgitation, mild-moderate mitral stenosis, chronic pericardial effusion, paroxysmal atrial flutter, and hypertension   Mary Farrell presents for cardiology evaluation.  She has been having problems with palpitations and irreg HR, rapid HR since 09/21. She has felt shaky, SOB, weak, tired. She has not been able to do things around the house because she feels so bad. She has not taken any extra Flecainide. Her HR has been faster than usual, but her machine has not indicated that her HR was irregular.   She was under stress recently, that has improved. However, during the stressful period, she did not have any palpitations or feel the need to check her BP more often than usual.   Until the last few days, she was having no cardiology issues.   She denies CP w/ exertion, LE edema, orthopnea or PND. The DOE did not start until her HR became elevated.    Past Medical History:  Diagnosis Date  . Aortic regurgitation 08/13/2015  . Arthritis    "hands, wrists, back, feet, toes" (09/18/2015)  . Atypical chest pain 05/13/2016  . Chest pain    remote cath in 1996 with NORMAL coronaries noted  . Exogenous obesity    history of   . GERD (gastroesophageal reflux disease)   . Glaucoma, both eyes   . Headache, migraine    "stopped in my 50's" (09/18/2015)  . History of cardiovascular stress test 2007   showing no ischemia  . Hypertension   . Mitral stenosis with insufficiency   . Murmur    of mitral stenosis and moderate aortic insufficiency      . Palpitations    occasional  . Personal history of rheumatic heart disease   . SBE (subacute bacterial endocarditis)    prophylaxis  . Sliding hiatal  hernia     Past Surgical History:  Procedure Laterality Date  . ABDOMINAL HYSTERECTOMY  1975  . APPENDECTOMY  1977  . BUNIONECTOMY Right 2000s  . CARDIAC CATHETERIZATION  01/13/1995   normal coronary anatomy and mild mitral stenosis and mild pulmonary hypertension  . CARDIOVERSION N/A 09/22/2015   Procedure: CARDIOVERSION;  Surgeon: Jerline Pain, MD;  Location: [facility];  Service: Cardiovascular;  Laterality: N/A;  . CATARACT EXTRACTION W/ INTRAOCULAR LENS  IMPLANT, BILATERAL Bilateral 2013  . LAPAROSCOPIC CHOLECYSTECTOMY  1997  . TONSILLECTOMY AND ADENOIDECTOMY  1990s  . UVULOPALATOPHARYNGOPLASTY, TONSILLECTOMY AND SEPTOPLASTY  1990s    Current Outpatient Prescriptions  Medication Sig Dispense Refill  . ALPRAZolam (XANAX) 0.25 MG tablet Take 0.25 mg by mouth as needed for anxiety.    . B Complex Vitamins (VITAMIN B COMPLEX PO) Take 1 capsule by mouth daily.      . Calcium Carbonate (CALCIUM 500 PO) Take 500 mg by mouth daily.      . carvedilol (COREG) 6.25 MG tablet TAKE 1 TABLET BY MOUTH TWO  TIMES DAILY 180 tablet 1  . cetirizine (ZYRTEC) 10 MG tablet Take 5 mg by mouth at bedtime.     . Cholecalciferol (VITAMIN D) 2000 UNITS CAPS Take 2,000 Units by mouth daily.      Marland Kitchen conjugated estrogens (PREMARIN) vaginal cream Place 1 Applicatorful vaginally  daily.    . Docusate Calcium (STOOL SOFTENER PO) Take 1 tablet by mouth 2 (two) times daily.    . dorzolamide-timolol (COSOPT) 22.3-6.8 MG/ML ophthalmic solution PLACE 1 DROP INTO EACH EYE DAILY  6  . doxepin (SINEQUAN) 10 MG capsule Take 20 mg by mouth at bedtime.     Marland Kitchen ELIQUIS 5 MG TABS tablet TAKE 1 TABLET BY MOUTH TWO  TIMES DAILY 180 tablet 1  . flecainide (TAMBOCOR) 50 MG tablet TAKE 1 TABLET BY MOUTH TWO  TIMES DAILY 180 tablet 1  . gabapentin (NEURONTIN) 300 MG capsule Take 300 mg by mouth 3 (three) times daily.    . hydrochlorothiazide (MICROZIDE) 12.5 MG capsule Take 12.5 mg by mouth daily.    Marland Kitchen latanoprost (XALATAN) 0.005  % ophthalmic solution Place 1 drop into both eyes daily.  6  . losartan (COZAAR) 100 MG tablet Take 100 mg by mouth daily.    . multivitamin (THERAGRAN) per tablet Take 1 tablet by mouth daily.      Marland Kitchen omeprazole (PRILOSEC) 40 MG capsule Take 40 mg by mouth daily.     . ondansetron (ZOFRAN) 4 MG tablet Take 1 tablet (4 mg total) by mouth every 8 (eight) hours as needed for nausea or vomiting. 20 tablet 0  . oxybutynin (DITROPAN-XL) 10 MG 24 hr tablet Take 10 mg by mouth.     No current facility-administered medications for this visit.     Allergies:   Metoprolol and Oxaprozin    Social History:  The patient  reports that she has never smoked. She has never used smokeless tobacco. She reports that she does not drink alcohol or use drugs.   Family History:  The patient's family history includes Diabetes in her brother; Heart attack in her mother; Heart failure in her maternal grandfather; Hypertension in her brother; Kidney disease in her brother.    ROS:  Please see the history of present illness. All other systems are reviewed and negative.    PHYSICAL EXAM: VS:  BP 102/67 (BP Location: Right Arm)   Pulse 83   Ht 5' (1.524 m)   Wt 162 lb 3.2 oz (73.6 kg)   BMI 31.68 kg/m  , BMI Body mass index is 31.68 kg/m. GEN: Well nourished, well developed, female in no acute distress  HEENT: normal for age  Neck: no JVD, no carotid bruit, no masses Cardiac: slightly irreg R&R; no murmur heard, no rubs, ?S4 gallop Respiratory:  clear to auscultation bilaterally, normal work of breathing GI: soft, nontender, nondistended, + BS MS: no deformity or atrophy; no edema; distal pulses are 2+ in all 4 extremities   Skin: warm and dry, no rash Neuro:  Strength and sensation are intact Psych: euthymic mood, full affect   EKG:  EKG is ordered today. The ekg ordered today demonstrates atrial fibrillation versus atyp atrial flutter, HR 76, no acute ischemic changes  ECHO: 05/28/2016 - Left  ventricle: The cavity size was normal. There was mild   concentric hypertrophy. Systolic function was normal. The   estimated ejection fraction was in the range of 60% to 65%. Wall   motion was normal; there were no regional wall motion   abnormalities. Features are consistent with a pseudonormal left   ventricular filling pattern, with concomitant abnormal relaxation   and increased filling pressure (grade 2 diastolic dysfunction).   Doppler parameters are consistent with elevated ventricular   end-diastolic filling pressure. - Aortic valve: Trileaflet; mildly thickened leaflets. There was   mild  regurgitation. - Aortic root: The aortic root was normal in size. - Mitral valve: Calcified annulus. Moderately thickened, moderately   calcified leaflets . The findings are consistent with moderate   stenosis. There was mild regurgitation. Mean gradient (D): 6 mm   Hg. Peak gradient (D): 12 mm Hg. - Left atrium: The atrium was severely dilated. - Right ventricle: Systolic function was normal. - Tricuspid valve: There was mild regurgitation. - Inferior vena cava: The vessel was normal in size. - Pericardium, extracardiac: Features were not consistent with   tamponade physiology. Impressions: - LVEF 60-65%.   There was moderate lokalized pericardial effusion around the   inferior and inferolateral walls unchanged from prior exam on   10/22/15. No evidence for tamponade.   Mitral valve is thickened and calcified, appears rheumatic with   anterior leaflet bowing. There is moderate mitral stenosis and   midl regurgitation.  Recent Labs: 12/02/2015: BUN 15; Creat 0.86; Potassium 4.3; Sodium 141    Lipid Panel No results found for: CHOL, TRIG, HDL, CHOLHDL, VLDL, LDLCALC, LDLDIRECT   Wt Readings from Last 3 Encounters:  10/18/16 162 lb 3.2 oz (73.6 kg)  09/09/16 158 lb 3.2 oz (71.8 kg)  06/07/16 164 lb 6.4 oz (74.6 kg)     Other studies Reviewed: Additional studies/ records that were  reviewed today include: office notes, hospital records and testing.  ASSESSMENT AND PLAN:  1.  Atrial fibrillation, history of atrial flutter: HR is controlled. This occurred on her usual dose of Flecainide. Dr Donette Larry said she could take extra, but pt did not because she was not sure she was out of rhythm.  Reviewed the case with Dr. Debara Pickett. He recommends increasing her flecainide from 50 mg twice a day to 100 mg twice a day. Come in next week for ECG and if she has not converted, schedule her for outpatient cardioversion. She does not feel well when she is in atrial fibrillation but her left atrium was severely dilated, right atrium normal in size on recent echo.  2. Chronic anticoag: Pt is compliant with her Eliquis. She denies missing any doses   3. DOE, fatigue: She has had these sx before when she was out of rhythm. Recent echo results above. The gradients in her mitral valve have not changed significantly. Reassess her symptoms after she is back in sinus rhythm and decide if further testing is needed.   Current medicines are reviewed at length with the patient today.  The patient does not have concerns regarding medicines.  The following changes have been made:  Increase flecainide  Labs/ tests ordered today include:  No orders of the defined types were placed in this encounter.    Disposition:   FU with Dr Oval Linsey  Signed, [name], PA-C  10/18/2016 11:48 AM    [facility] Phone: [phone]; Fax: [phone]  This note was written with the assistance of speech recognition software. Please excuse any transcriptional errors.

## 2016-10-18 NOTE — Telephone Encounter (Signed)
Spoke to patient.  Reporting BP and HR issues continued over weekend.  Yesterday 6am  109/66 HR 85   11am 102/55 HR 76  8pm  121/75 HR 107 then HR 100 a few minutes later. She states she felt "funny" preceding checking her BP, she was sitting on the couch reading when she began to have palpitations.  10pm 110/65 HR 103  Today 8am  107/63 HR 103   Pt concerned bc HR seems to be higher than her reported previous average Advised that she could come in for EKG per Dr. Blenda Mounts suggestion. Given that she finds it hard to predict her symptoms and these are not sustained, we discussed, pt would prefer to have APP see her to give a more thorough evaluation She's been averse to trying a heart monitor in the past but states she might be OK w this.   I offered to put her on PA schedule today, to which pt was agreeable. She will come to see Suanne Marker today at 11:30am .

## 2016-10-21 ENCOUNTER — Telehealth: Payer: Self-pay | Admitting: Cardiovascular Disease

## 2016-10-21 NOTE — Telephone Encounter (Signed)
Pt notified that Suanne Marker was notified and she has to talk to MD then she will call us back.

## 2016-10-21 NOTE — Telephone Encounter (Signed)
S/w pt she states that she was in on Tuesday to see Suanne Marker and is in aflutter and Flecainide was increased and she thinks that medication is not helping. She states that her BP last night was 130's and then this morning it has has down a bit to 124. She states that she is not feeling well because of this.

## 2016-10-21 NOTE — H&P (Signed)
Cardiology History and Physical   Date:  10/18/2016   ID:  Mary Farrell, DOB 1945/07/22, MRN [id number]  PCP:  Lawerance Cruel, MD           Cardiologist:  Dr Oval Linsey 09/09/2016  [name], PA-C 09/2015   History of Present Illness: Mary Farrell is a 71 y.o. female with a history of moderate aortic regurgitation, mild-moderate mitral stenosis, chronic pericardial effusion, paroxysmal atrial flutter, and hypertension   Mary Farrell presents for cardiology evaluation.  She has been having problems with palpitations and irreg HR, rapid HR since 09/21. She has felt shaky, SOB, weak, tired. She has not been able to do things around the house because she feels so bad. She has not taken any extra Flecainide. Her HR has been faster than usual, but her machine has not indicated that her HR was irregular.   She was under stress recently, that has improved. However, during the stressful period, she did not have any palpitations or feel the need to check her BP more often than usual.   Until the last few days, she was having no cardiology issues.   She denies CP w/ exertion, LE edema, orthopnea or PND. The DOE did not start until her HR became elevated.        Past Medical History:  Diagnosis Date  . Aortic regurgitation 08/13/2015  . Arthritis    "hands, wrists, back, feet, toes" (09/18/2015)  . Atypical chest pain 05/13/2016  . Chest pain    remote cath in 1996 with NORMAL coronaries noted  . Exogenous obesity    history of   . GERD (gastroesophageal reflux disease)   . Glaucoma, both eyes   . Headache, migraine    "stopped in my 50's" (09/18/2015)  . History of cardiovascular stress test 2007   showing no ischemia  . Hypertension   . Mitral stenosis with insufficiency   . Murmur    of mitral stenosis and moderate aortic insufficiency      . Palpitations    occasional  . Personal history of rheumatic heart disease   . SBE (subacute  bacterial endocarditis)    prophylaxis  . Sliding hiatal hernia          Past Surgical History:  Procedure Laterality Date  . ABDOMINAL HYSTERECTOMY  1975  . APPENDECTOMY  1977  . BUNIONECTOMY Right 2000s  . CARDIAC CATHETERIZATION  01/13/1995   normal coronary anatomy and mild mitral stenosis and mild pulmonary hypertension  . CARDIOVERSION N/A 09/22/2015   Procedure: CARDIOVERSION;  Surgeon: Jerline Pain, MD;  Location: [facility];  Service: Cardiovascular;  Laterality: N/A;  . CATARACT EXTRACTION W/ INTRAOCULAR LENS  IMPLANT, BILATERAL Bilateral 2013  . LAPAROSCOPIC CHOLECYSTECTOMY  1997  . TONSILLECTOMY AND ADENOIDECTOMY  1990s  . UVULOPALATOPHARYNGOPLASTY, TONSILLECTOMY AND SEPTOPLASTY  1990s          Current Outpatient Prescriptions  Medication Sig Dispense Refill  . ALPRAZolam (XANAX) 0.25 MG tablet Take 0.25 mg by mouth as needed for anxiety.    . B Complex Vitamins (VITAMIN B COMPLEX PO) Take 1 capsule by mouth daily.      . Calcium Carbonate (CALCIUM 500 PO) Take 500 mg by mouth daily.      . carvedilol (COREG) 6.25 MG tablet TAKE 1 TABLET BY MOUTH TWO  TIMES DAILY 180 tablet 1  . cetirizine (ZYRTEC) 10 MG tablet Take 5 mg by mouth at bedtime.     . Cholecalciferol (VITAMIN  D) 2000 UNITS CAPS Take 2,000 Units by mouth daily.      Marland Kitchen conjugated estrogens (PREMARIN) vaginal cream Place 1 Applicatorful vaginally daily.    Mariane Baumgarten Calcium (STOOL SOFTENER PO) Take 1 tablet by mouth 2 (two) times daily.    . dorzolamide-timolol (COSOPT) 22.3-6.8 MG/ML ophthalmic solution PLACE 1 DROP INTO EACH EYE DAILY  6  . doxepin (SINEQUAN) 10 MG capsule Take 20 mg by mouth at bedtime.     Marland Kitchen ELIQUIS 5 MG TABS tablet TAKE 1 TABLET BY MOUTH TWO  TIMES DAILY 180 tablet 1  . flecainide (TAMBOCOR) 50 MG tablet TAKE 1 TABLET BY MOUTH TWO  TIMES DAILY 180 tablet 1  . gabapentin (NEURONTIN) 300 MG capsule Take 300 mg by mouth 3 (three) times daily.    .  hydrochlorothiazide (MICROZIDE) 12.5 MG capsule Take 12.5 mg by mouth daily.    Marland Kitchen latanoprost (XALATAN) 0.005 % ophthalmic solution Place 1 drop into both eyes daily.  6  . losartan (COZAAR) 100 MG tablet Take 100 mg by mouth daily.    . multivitamin (THERAGRAN) per tablet Take 1 tablet by mouth daily.      Marland Kitchen omeprazole (PRILOSEC) 40 MG capsule Take 40 mg by mouth daily.     . ondansetron (ZOFRAN) 4 MG tablet Take 1 tablet (4 mg total) by mouth every 8 (eight) hours as needed for nausea or vomiting. 20 tablet 0  . oxybutynin (DITROPAN-XL) 10 MG 24 hr tablet Take 10 mg by mouth.     No current facility-administered medications for this visit.     Allergies:   Metoprolol and Oxaprozin    Social History   Social History  . Marital status: Married    Spouse name: N/A  . Number of children: N/A  . Years of education: N/A   Occupational History  . Retired    Social History Main Topics  . Smoking status: Never Smoker  . Smokeless tobacco: Never Used  . Alcohol use No  . Drug use: No  . Sexual activity: Not Currently   Other Topics Concern  . Not on file   Social History Narrative   Lives with husband in [place].     Family History  Problem Relation Age of Onset  . Heart attack Mother   . Hypertension Brother   . Kidney disease Brother   . Diabetes Brother   . Heart failure Maternal Grandfather    Family Status  Relation Status  . Mother Deceased at age 29  . Father Deceased at age 51       car accident  . Brother Alive  . MGM Deceased  . MGF Deceased  . PGM Deceased  . PGF Deceased    ROS:  Please see the history of present illness. All other systems are reviewed and negative.    PHYSICAL EXAM: VS:  BP 102/67 (BP Location: Right Arm)   Pulse 83   Ht 5' (1.524 m)   Wt 162 lb 3.2 oz (73.6 kg)   BMI 31.68 kg/m  , BMI Body mass index is 31.68 kg/m. GEN: Well nourished, well developed, female in no acute distress  HEENT: normal for  age  Neck: no JVD, no carotid bruit, no masses Cardiac: slightly irreg R&R; no murmur heard, no rubs, ?S4 gallop Respiratory:  clear to auscultation bilaterally, normal work of breathing GI: soft, nontender, nondistended, + BS MS: no deformity or atrophy; no edema; distal pulses are 2+ in all 4 extremities   Skin:  warm and dry, no rash Neuro:  Strength and sensation are intact Psych: euthymic mood, full affect   EKG:  EKG is ordered today. The ekg ordered today demonstrates atrial fibrillation versus atyp atrial flutter, HR 76, no acute ischemic changes  ECHO: 05/28/2016 - Left ventricle: The cavity size was normal. There was mild concentric hypertrophy. Systolic function was normal. The estimated ejection fraction was in the range of 60% to 65%. Wall motion was normal; there were no regional wall motion abnormalities. Features are consistent with a pseudonormal left ventricular filling pattern, with concomitant abnormal relaxation and increased filling pressure (grade 2 diastolic dysfunction). Doppler parameters are consistent with elevated ventricular end-diastolic filling pressure. - Aortic valve: Trileaflet; mildly thickened leaflets. There was mild regurgitation. - Aortic root: The aortic root was normal in size. - Mitral valve: Calcified annulus. Moderately thickened, moderately calcified leaflets . The findings are consistent with moderate stenosis. There was mild regurgitation. Mean gradient (D): 6 mm Hg. Peak gradient (D): 12 mm Hg. - Left atrium: The atrium was severely dilated. - Right ventricle: Systolic function was normal. - Tricuspid valve: There was mild regurgitation. - Inferior vena cava: The vessel was normal in size. - Pericardium, extracardiac: Features were not consistent with tamponade physiology. Impressions: - LVEF 60-65%. There was moderate lokalized pericardial effusion around the inferior and inferolateral walls  unchanged from prior exam on 10/22/15. No evidence for tamponade. Mitral valve is thickened and calcified, appears rheumatic with anterior leaflet bowing. There is moderate mitral stenosis and midl regurgitation.  Recent Labs: 12/02/2015: BUN 15; Creat 0.86; Potassium 4.3; Sodium 141    Lipid Panel Labs (Brief)  No results found for: CHOL, TRIG, HDL, CHOLHDL, VLDL, LDLCALC, LDLDIRECT        Wt Readings from Last 3 Encounters:  10/18/16 162 lb 3.2 oz (73.6 kg)  09/09/16 158 lb 3.2 oz (71.8 kg)  06/07/16 164 lb 6.4 oz (74.6 kg)     Other studies Reviewed: Additional studies/ records that were reviewed today include: office notes, hospital records and testing.  ASSESSMENT AND PLAN:  1.  Atrial fibrillation, history of atrial flutter: HR is controlled. This occurred on her usual dose of Flecainide. Dr Donette Larry said she could take extra, but pt did not because she was not sure she was out of rhythm.  Reviewed the case with Dr. Debara Pickett. He recommends increasing her flecainide from 50 mg twice a day to 100 mg twice a day. Come in next week for ECG and if she has not converted, schedule her for outpatient cardioversion. She does not feel well when she is in atrial fibrillation but her left atrium was severely dilated, right atrium normal in size on recent echo.  2. Chronic anticoag: Pt is compliant with her Eliquis. She denies missing any doses   3. DOE, fatigue: She has had these sx before when she was out of rhythm. Recent echo results above. The gradients in her mitral valve have not changed significantly. Reassess her symptoms after she is back in sinus rhythm and decide if further testing is needed.   Current medicines are reviewed at length with the patient today.  The patient does not have concerns regarding medicines.  The following changes have been made:  Increase flecainide  Labs/ tests ordered today include:  No orders of the defined types were  placed in this encounter.    Disposition:   FU with Dr Oval Linsey  Signed, [name], PA-C  10/18/2016 11:48 AM    [facility] Phone: [phone]; Fax: [phone]  This note was written with the assistance of speech recognition software. Please excuse any transcriptional errors.  ADDENDUM:  Pt called because she had been on the increased dose of Flecainide for several days. No improvement. Her HR is higher than previously seen at times, >130. She feels poorly, weak, increased DOE. She wishes to go ahead and be cardioverted. The earliest time available is Monday at 11 am. She does not wish to come to the ER, so that was scheduled. She needs to come in at 9 am for labs. She needs to be NPO after midnight.   She assures me she has not missed any doses of her Eliquis.   Lenoard Aden 10/21/2016 2:39 PM [facility] [phone]

## 2016-10-21 NOTE — Telephone Encounter (Signed)
Spoke with rhonda she states that she has scheduled pt for cardioversion Monday at [facility], make sure to be NPO-midnight, take all medications with sips of water as directed before the DCCV espically her eliquis.  Pt notified she will be there early to check in

## 2016-10-21 NOTE — Telephone Encounter (Signed)
Pt states that her HR is in the 90's now

## 2016-10-21 NOTE — Telephone Encounter (Signed)
Follow up    Pt states she wants to be called on her cell phone. She is calling to follow up .

## 2016-10-21 NOTE — Telephone Encounter (Signed)
Tried to call pt no answer

## 2016-10-21 NOTE — Telephone Encounter (Signed)
New message    Pt c/o Shortness Of Breath: STAT if SOB developed within the last 24 hours or pt is noticeably SOB on the phone  1. Are you currently SOB (can you hear that pt is SOB on the phone)?  No   2. How long have you been experiencing SOB?  For awhile   3. Are you SOB when sitting or when up moving around?  both 4. Are you currently experiencing any other symptoms? High heart rate 132 , concerned with heart rate being this high and the medication does not seem to be working , was put on flecainide (TAMBOCOR) 50 MG tablet

## 2016-10-22 ENCOUNTER — Telehealth: Payer: Self-pay | Admitting: Cardiovascular Disease

## 2016-10-22 NOTE — Telephone Encounter (Signed)
Pt advised to keep appt as scheduled so we may follow up after cardioversion, gave her recommendation to discuss w hospital staff at discharge. Can cancel next week if provider advises OK. Pt verbalized understanding and thanks.

## 2016-10-22 NOTE — Telephone Encounter (Signed)
New message    Pt is calling. She said she was told that she is to have a procedure Monday. She wants to know if she needs to keep her appt on Wednesday with [name]. Please call.

## 2016-10-25 ENCOUNTER — Telehealth: Payer: Self-pay | Admitting: Cardiovascular Disease

## 2016-10-25 ENCOUNTER — Ambulatory Visit (HOSPITAL_COMMUNITY): Payer: Medicare Other | Admitting: Anesthesiology

## 2016-10-25 ENCOUNTER — Encounter (HOSPITAL_COMMUNITY): Payer: Self-pay | Admitting: *Deleted

## 2016-10-25 ENCOUNTER — Encounter (HOSPITAL_COMMUNITY)
Admission: RE | Disposition: A | Discharge status: Home / Self Care | Payer: Self-pay | Source: Ambulatory Visit | Attending: Internal Medicine

## 2016-10-25 ENCOUNTER — Ambulatory Visit (HOSPITAL_COMMUNITY)
Admission: RE | Admit: 2016-10-25 | Discharge: 2016-10-25 | Disposition: A | Discharge status: Home / Self Care | Payer: Medicare Other | Source: Ambulatory Visit | Attending: Internal Medicine | Admitting: Internal Medicine

## 2016-10-25 DIAGNOSIS — Z841 Family history of disorders of kidney and ureter: Secondary | ICD-10-CM | POA: Diagnosis not present

## 2016-10-25 DIAGNOSIS — H409 Unspecified glaucoma: Secondary | ICD-10-CM | POA: Diagnosis not present

## 2016-10-25 DIAGNOSIS — I313 Pericardial effusion (noninflammatory): Secondary | ICD-10-CM | POA: Diagnosis not present

## 2016-10-25 DIAGNOSIS — Z79899 Other long term (current) drug therapy: Secondary | ICD-10-CM | POA: Insufficient documentation

## 2016-10-25 DIAGNOSIS — K219 Gastro-esophageal reflux disease without esophagitis: Secondary | ICD-10-CM | POA: Insufficient documentation

## 2016-10-25 DIAGNOSIS — I083 Combined rheumatic disorders of mitral, aortic and tricuspid valves: Secondary | ICD-10-CM | POA: Diagnosis not present

## 2016-10-25 DIAGNOSIS — K449 Diaphragmatic hernia without obstruction or gangrene: Secondary | ICD-10-CM | POA: Diagnosis not present

## 2016-10-25 DIAGNOSIS — Z9049 Acquired absence of other specified parts of digestive tract: Secondary | ICD-10-CM | POA: Insufficient documentation

## 2016-10-25 DIAGNOSIS — Z9841 Cataract extraction status, right eye: Secondary | ICD-10-CM | POA: Insufficient documentation

## 2016-10-25 DIAGNOSIS — Z8249 Family history of ischemic heart disease and other diseases of the circulatory system: Secondary | ICD-10-CM | POA: Diagnosis not present

## 2016-10-25 DIAGNOSIS — I4891 Unspecified atrial fibrillation: Secondary | ICD-10-CM

## 2016-10-25 DIAGNOSIS — I4892 Unspecified atrial flutter: Secondary | ICD-10-CM | POA: Insufficient documentation

## 2016-10-25 DIAGNOSIS — E669 Obesity, unspecified: Secondary | ICD-10-CM | POA: Diagnosis not present

## 2016-10-25 DIAGNOSIS — Z7901 Long term (current) use of anticoagulants: Secondary | ICD-10-CM | POA: Insufficient documentation

## 2016-10-25 DIAGNOSIS — I10 Essential (primary) hypertension: Secondary | ICD-10-CM | POA: Diagnosis not present

## 2016-10-25 DIAGNOSIS — Z90710 Acquired absence of both cervix and uterus: Secondary | ICD-10-CM | POA: Diagnosis not present

## 2016-10-25 DIAGNOSIS — Z833 Family history of diabetes mellitus: Secondary | ICD-10-CM | POA: Insufficient documentation

## 2016-10-25 DIAGNOSIS — Z9842 Cataract extraction status, left eye: Secondary | ICD-10-CM | POA: Diagnosis not present

## 2016-10-25 DIAGNOSIS — R011 Cardiac murmur, unspecified: Secondary | ICD-10-CM | POA: Diagnosis not present

## 2016-10-25 DIAGNOSIS — Z6831 Body mass index (BMI) 31.0-31.9, adult: Secondary | ICD-10-CM | POA: Diagnosis not present

## 2016-10-25 DIAGNOSIS — R002 Palpitations: Secondary | ICD-10-CM | POA: Insufficient documentation

## 2016-10-25 HISTORY — PX: CARDIOVERSION: SHX1299

## 2016-10-25 LAB — POCT I-STAT, CHEM 8
BUN: 10 mg/dL (ref 6–20)
CALCIUM ION: 1.12 mmol/L — AB (ref 1.15–1.40)
CHLORIDE: 103 mmol/L (ref 101–111)
Creatinine, Ser: 0.9 mg/dL (ref 0.44–1.00)
GLUCOSE: 100 mg/dL — AB (ref 65–99)
HCT: 38 % (ref 36.0–46.0)
Hemoglobin: 12.9 g/dL (ref 12.0–15.0)
POTASSIUM: 3.7 mmol/L (ref 3.5–5.1)
SODIUM: 141 mmol/L (ref 135–145)
TCO2: 28 mmol/L (ref 22–32)

## 2016-10-25 SURGERY — CARDIOVERSION
Anesthesia: General

## 2016-10-25 MED ORDER — SODIUM CHLORIDE 0.9 % IV SOLN
INTRAVENOUS | Status: DC
  Administered 2016-10-25: 10:00:00 via INTRAVENOUS

## 2016-10-25 MED ORDER — LIDOCAINE HCL (CARDIAC) 20 MG/ML IV SOLN
INTRAVENOUS | Status: DC | PRN
  Administered 2016-10-25: 40 mg via INTRAVENOUS

## 2016-10-25 MED ORDER — PROPOFOL 10 MG/ML IV BOLUS
INTRAVENOUS | Status: DC | PRN
  Administered 2016-10-25: 60 mg via INTRAVENOUS

## 2016-10-25 NOTE — Op Note (Signed)
Cardioversion  Pt anesthetized by anesthesia   WIth pads in the AP position patient cardioverted to SR with 200 J synchronized biphasic energy Procedure without complication 12 lead EKG pending

## 2016-10-25 NOTE — Telephone Encounter (Signed)
Spoke with patient and scheduled her appointment with Dr Oval Linsey for tomorrow.

## 2016-10-25 NOTE — Discharge Instructions (Signed)
Electrical Cardioversion, Care After °This sheet gives you information about how to care for yourself after your procedure. Your health care provider may also give you more specific instructions. If you have problems or questions, contact your health care provider. °What can I expect after the procedure? °After the procedure, it is common to have: °· Some redness on the skin where the shocks were given. ° °Follow these instructions at home: °· Do not drive for 24 hours if you were given a medicine to help you relax (sedative). °· Take over-the-counter and prescription medicines only as told by your health care provider. °· Ask your health care provider how to check your pulse. Check it often. °· Rest for 48 hours after the procedure or as told by your health care provider. °· Avoid or limit your caffeine use as told by your health care provider. °Contact a health care provider if: °· You feel like your heart is beating too quickly or your pulse is not regular. °· You have a serious muscle cramp that does not go away. °Get help right away if: °· You have discomfort in your chest. °· You are dizzy or you feel faint. °· You have trouble breathing or you are short of breath. °· Your speech is slurred. °· You have trouble moving an arm or leg on one side of your body. °· Your fingers or toes turn cold or blue. °This information is not intended to replace advice given to you by your health care provider. Make sure you discuss any questions you have with your health care provider. °Document Released: 11/01/2012 Document Revised: 08/15/2015 Document Reviewed: 07/18/2015 °Elsevier Interactive Patient Education © 2018 [employer] ° °

## 2016-10-25 NOTE — Interval H&P Note (Signed)
History and Physical Interval Note:  10/25/2016 9:38 AM  Mary Farrell  has presented today for surgery, with the diagnosis of A-FIB  The various methods of treatment have been discussed with the patient and family. After consideration of risks, benefits and other options for treatment, the patient has consented to  Procedure(s): CARDIOVERSION (N/A) as a surgical intervention .  The patient's history has been reviewed, patient examined, no change in status, stable for surgery.  I have reviewed the patient's chart and labs.  Questions were answered to the patient's satisfaction.     [name]

## 2016-10-25 NOTE — Anesthesia Procedure Notes (Signed)
Procedure Name: MAC Date/Time: 10/25/2016 10:42 AM Performed by: Izora Gala Pre-anesthesia Checklist: Patient identified, Emergency Drugs available, Suction available and Patient being monitored Patient Re-evaluated:Patient Re-evaluated prior to induction Oxygen Delivery Method: Ambu bag Induction Type: IV induction Placement Confirmation: positive ETCO2

## 2016-10-25 NOTE — Telephone Encounter (Signed)
°  New Prob  Pt states she had a cardioversion done this morning but feel she is right back in A-Fib. HR is currently 108 and 72 at the hospital after cardioversion. Requesting to speak to a nurse. Please call.

## 2016-10-25 NOTE — Anesthesia Preprocedure Evaluation (Signed)
Anesthesia Evaluation  Patient identified by MRN, date of birth, ID band Patient awake    Reviewed: Allergy & Precautions, NPO status , Patient's Chart, lab work & pertinent test results  Airway Mallampati: I  TM Distance: >3 FB Neck ROM: Full    Dental  (+) Teeth Intact   Pulmonary    Pulmonary exam normal        Cardiovascular hypertension, Pt. on home beta blockers and Pt. on medications Normal cardiovascular exam+ Valvular Problems/Murmurs AI  Rhythm:Irregular Rate:Normal     Neuro/Psych    GI/Hepatic Neg liver ROS, GERD  Medicated and Controlled,  Endo/Other  negative endocrine ROS  Renal/GU negative Renal ROS  negative genitourinary   Musculoskeletal   Abdominal (+) + obese,   Peds  Hematology negative hematology ROS (+)   Anesthesia Other Findings   Reproductive/Obstetrics                             Anesthesia Physical  Anesthesia Plan  ASA: III  Anesthesia Plan: General   Post-op Pain Management:    Induction: Intravenous  PONV Risk Score and Plan: 2 and Ondansetron and Dexamethasone  Airway Management Planned: Mask, Natural Airway and Simple Face Mask  Additional Equipment:   Intra-op Plan:   Post-operative Plan:   Informed Consent: I have reviewed the patients History and Physical, chart, labs and discussed the procedure including the risks, benefits and alternatives for the proposed anesthesia with the patient or authorized representative who has indicated his/her understanding and acceptance.     Plan Discussed with: CRNA and Surgeon  Anesthesia Plan Comments:         Anesthesia Quick Evaluation

## 2016-10-25 NOTE — Transfer of Care (Signed)
Immediate Anesthesia Transfer of Care Note  Patient: Mary Farrell  Procedure(s) Performed: CARDIOVERSION (N/A )  Patient Location: PACU  Anesthesia Type:General  Level of Consciousness: awake and alert   Airway & Oxygen Therapy: Patient Spontanous Breathing and Patient connected to nasal cannula oxygen  Post-op Assessment: Report given to RN and Post -op Vital signs reviewed and stable  Post vital signs: Reviewed and stable  Last Vitals:  Vitals:   10/25/16 0933 10/25/16 1051  BP: (!) 155/83 (!) 125/54  Pulse:  74  Resp: 19 (!) 22  SpO2: 98% 99%    Last Pain:  Vitals:   10/25/16 0933  TempSrc: Oral         Complications: No apparent anesthesia complications

## 2016-10-25 NOTE — H&P (View-Only) (Signed)
Cardiology Office Note   Date:  10/18/2016   ID:  LEXANI CORONA, DOB 06/28/1945, MRN [id number]  PCP:  Lawerance Cruel, MD  Cardiologist:  Dr Oval Linsey 09/09/2016  [name], PA-C 09/2015   History of Present Illness: Mary Farrell is a 71 y.o. female with a history of moderate aortic regurgitation, mild-moderate mitral stenosis, chronic pericardial effusion, paroxysmal atrial flutter, and hypertension   Mary Farrell presents for cardiology evaluation.  She has been having problems with palpitations and irreg HR, rapid HR since 09/21. She has felt shaky, SOB, weak, tired. She has not been able to do things around the house because she feels so bad. She has not taken any extra Flecainide. Her HR has been faster than usual, but her machine has not indicated that her HR was irregular.   She was under stress recently, that has improved. However, during the stressful period, she did not have any palpitations or feel the need to check her BP more often than usual.   Until the last few days, she was having no cardiology issues.   She denies CP w/ exertion, LE edema, orthopnea or PND. The DOE did not start until her HR became elevated.    Past Medical History:  Diagnosis Date  . Aortic regurgitation 08/13/2015  . Arthritis    "hands, wrists, back, feet, toes" (09/18/2015)  . Atypical chest pain 05/13/2016  . Chest pain    remote cath in 1996 with NORMAL coronaries noted  . Exogenous obesity    history of   . GERD (gastroesophageal reflux disease)   . Glaucoma, both eyes   . Headache, migraine    "stopped in my 50's" (09/18/2015)  . History of cardiovascular stress test 2007   showing no ischemia  . Hypertension   . Mitral stenosis with insufficiency   . Murmur    of mitral stenosis and moderate aortic insufficiency      . Palpitations    occasional  . Personal history of rheumatic heart disease   . SBE (subacute bacterial endocarditis)    prophylaxis  . Sliding hiatal  hernia     Past Surgical History:  Procedure Laterality Date  . ABDOMINAL HYSTERECTOMY  1975  . APPENDECTOMY  1977  . BUNIONECTOMY Right 2000s  . CARDIAC CATHETERIZATION  01/13/1995   normal coronary anatomy and mild mitral stenosis and mild pulmonary hypertension  . CARDIOVERSION N/A 09/22/2015   Procedure: CARDIOVERSION;  Surgeon: Jerline Pain, MD;  Location: [place];  Service: Cardiovascular;  Laterality: N/A;  . CATARACT EXTRACTION W/ INTRAOCULAR LENS  IMPLANT, BILATERAL Bilateral 2013  . LAPAROSCOPIC CHOLECYSTECTOMY  1997  . TONSILLECTOMY AND ADENOIDECTOMY  1990s  . UVULOPALATOPHARYNGOPLASTY, TONSILLECTOMY AND SEPTOPLASTY  1990s    Current Outpatient Prescriptions  Medication Sig Dispense Refill  . ALPRAZolam (XANAX) 0.25 MG tablet Take 0.25 mg by mouth as needed for anxiety.    . B Complex Vitamins (VITAMIN B COMPLEX PO) Take 1 capsule by mouth daily.      . Calcium Carbonate (CALCIUM 500 PO) Take 500 mg by mouth daily.      . carvedilol (COREG) 6.25 MG tablet TAKE 1 TABLET BY MOUTH TWO  TIMES DAILY 180 tablet 1  . cetirizine (ZYRTEC) 10 MG tablet Take 5 mg by mouth at bedtime.     . Cholecalciferol (VITAMIN D) 2000 UNITS CAPS Take 2,000 Units by mouth daily.      Marland Kitchen conjugated estrogens (PREMARIN) vaginal cream Place 1 Applicatorful vaginally  daily.    . Docusate Calcium (STOOL SOFTENER PO) Take 1 tablet by mouth 2 (two) times daily.    . dorzolamide-timolol (COSOPT) 22.3-6.8 MG/ML ophthalmic solution PLACE 1 DROP INTO EACH EYE DAILY  6  . doxepin (SINEQUAN) 10 MG capsule Take 20 mg by mouth at bedtime.     Marland Kitchen ELIQUIS 5 MG TABS tablet TAKE 1 TABLET BY MOUTH TWO  TIMES DAILY 180 tablet 1  . flecainide (TAMBOCOR) 50 MG tablet TAKE 1 TABLET BY MOUTH TWO  TIMES DAILY 180 tablet 1  . gabapentin (NEURONTIN) 300 MG capsule Take 300 mg by mouth 3 (three) times daily.    . hydrochlorothiazide (MICROZIDE) 12.5 MG capsule Take 12.5 mg by mouth daily.    Marland Kitchen latanoprost (XALATAN) 0.005  % ophthalmic solution Place 1 drop into both eyes daily.  6  . losartan (COZAAR) 100 MG tablet Take 100 mg by mouth daily.    . multivitamin (THERAGRAN) per tablet Take 1 tablet by mouth daily.      Marland Kitchen omeprazole (PRILOSEC) 40 MG capsule Take 40 mg by mouth daily.     . ondansetron (ZOFRAN) 4 MG tablet Take 1 tablet (4 mg total) by mouth every 8 (eight) hours as needed for nausea or vomiting. 20 tablet 0  . oxybutynin (DITROPAN-XL) 10 MG 24 hr tablet Take 10 mg by mouth.     No current facility-administered medications for this visit.     Allergies:   Metoprolol and Oxaprozin    Social History:  The patient  reports that she has never smoked. She has never used smokeless tobacco. She reports that she does not drink alcohol or use drugs.   Family History:  The patient's family history includes Diabetes in her brother; Heart attack in her mother; Heart failure in her maternal grandfather; Hypertension in her brother; Kidney disease in her brother.    ROS:  Please see the history of present illness. All other systems are reviewed and negative.    PHYSICAL EXAM: VS:  BP 102/67 (BP Location: Right Arm)   Pulse 83   Ht 5' (1.524 m)   Wt 162 lb 3.2 oz (73.6 kg)   BMI 31.68 kg/m  , BMI Body mass index is 31.68 kg/m. GEN: Well nourished, well developed, female in no acute distress  HEENT: normal for age  Neck: no JVD, no carotid bruit, no masses Cardiac: slightly irreg R&R; no murmur heard, no rubs, ?S4 gallop Respiratory:  clear to auscultation bilaterally, normal work of breathing GI: soft, nontender, nondistended, + BS MS: no deformity or atrophy; no edema; distal pulses are 2+ in all 4 extremities   Skin: warm and dry, no rash Neuro:  Strength and sensation are intact Psych: euthymic mood, full affect   EKG:  EKG is ordered today. The ekg ordered today demonstrates atrial fibrillation versus atyp atrial flutter, HR 76, no acute ischemic changes  ECHO: 05/28/2016 - Left  ventricle: The cavity size was normal. There was mild   concentric hypertrophy. Systolic function was normal. The   estimated ejection fraction was in the range of 60% to 65%. Wall   motion was normal; there were no regional wall motion   abnormalities. Features are consistent with a pseudonormal left   ventricular filling pattern, with concomitant abnormal relaxation   and increased filling pressure (grade 2 diastolic dysfunction).   Doppler parameters are consistent with elevated ventricular   end-diastolic filling pressure. - Aortic valve: Trileaflet; mildly thickened leaflets. There was   mild  regurgitation. - Aortic root: The aortic root was normal in size. - Mitral valve: Calcified annulus. Moderately thickened, moderately   calcified leaflets . The findings are consistent with moderate   stenosis. There was mild regurgitation. Mean gradient (D): 6 mm   Hg. Peak gradient (D): 12 mm Hg. - Left atrium: The atrium was severely dilated. - Right ventricle: Systolic function was normal. - Tricuspid valve: There was mild regurgitation. - Inferior vena cava: The vessel was normal in size. - Pericardium, extracardiac: Features were not consistent with   tamponade physiology. Impressions: - LVEF 60-65%.   There was moderate lokalized pericardial effusion around the   inferior and inferolateral walls unchanged from prior exam on   10/22/15. No evidence for tamponade.   Mitral valve is thickened and calcified, appears rheumatic with   anterior leaflet bowing. There is moderate mitral stenosis and   midl regurgitation.  Recent Labs: 12/02/2015: BUN 15; Creat 0.86; Potassium 4.3; Sodium 141    Lipid Panel No results found for: CHOL, TRIG, HDL, CHOLHDL, VLDL, LDLCALC, LDLDIRECT   Wt Readings from Last 3 Encounters:  10/18/16 162 lb 3.2 oz (73.6 kg)  09/09/16 158 lb 3.2 oz (71.8 kg)  06/07/16 164 lb 6.4 oz (74.6 kg)     Other studies Reviewed: Additional studies/ records that were  reviewed today include: office notes, hospital records and testing.  ASSESSMENT AND PLAN:  1.  Atrial fibrillation, history of atrial flutter: HR is controlled. This occurred on her usual dose of Flecainide. Dr Donette Larry said she could take extra, but pt did not because she was not sure she was out of rhythm.  Reviewed the case with Dr. Debara Pickett. He recommends increasing her flecainide from 50 mg twice a day to 100 mg twice a day. Come in next week for ECG and if she has not converted, schedule her for outpatient cardioversion. She does not feel well when she is in atrial fibrillation but her left atrium was severely dilated, right atrium normal in size on recent echo.  2. Chronic anticoag: Pt is compliant with her Eliquis. She denies missing any doses   3. DOE, fatigue: She has had these sx before when she was out of rhythm. Recent echo results above. The gradients in her mitral valve have not changed significantly. Reassess her symptoms after she is back in sinus rhythm and decide if further testing is needed.   Current medicines are reviewed at length with the patient today.  The patient does not have concerns regarding medicines.  The following changes have been made:  Increase flecainide  Labs/ tests ordered today include:  No orders of the defined types were placed in this encounter.    Disposition:   FU with Dr Oval Linsey  Signed, [name], PA-C  10/18/2016 11:48 AM    [place] Phone: [phone]; Fax: [phone]  This note was written with the assistance of speech recognition software. Please excuse any transcriptional errors.

## 2016-10-26 ENCOUNTER — Ambulatory Visit (INDEPENDENT_AMBULATORY_CARE_PROVIDER_SITE_OTHER): Payer: Medicare Other | Admitting: Cardiovascular Disease

## 2016-10-26 ENCOUNTER — Encounter: Payer: Self-pay | Admitting: Cardiovascular Disease

## 2016-10-26 VITALS — BP 141/72 | HR 74 | Ht 60.0 in | Wt 159.8 lb

## 2016-10-26 DIAGNOSIS — I080 Rheumatic disorders of both mitral and aortic valves: Secondary | ICD-10-CM

## 2016-10-26 DIAGNOSIS — R0602 Shortness of breath: Secondary | ICD-10-CM

## 2016-10-26 DIAGNOSIS — R0789 Other chest pain: Secondary | ICD-10-CM | POA: Diagnosis not present

## 2016-10-26 DIAGNOSIS — I10 Essential (primary) hypertension: Secondary | ICD-10-CM | POA: Diagnosis not present

## 2016-10-26 DIAGNOSIS — I480 Paroxysmal atrial fibrillation: Secondary | ICD-10-CM | POA: Diagnosis not present

## 2016-10-26 NOTE — Anesthesia Postprocedure Evaluation (Signed)
Anesthesia Post Note  Patient: Mary Farrell  Procedure(s) Performed: CARDIOVERSION (N/A )     Patient location during evaluation: Endoscopy Anesthesia Type: General Level of consciousness: awake Pain management: pain level controlled Vital Signs Assessment: post-procedure vital signs reviewed and stable Respiratory status: spontaneous breathing Cardiovascular status: stable Postop Assessment: no apparent nausea or vomiting Anesthetic complications: no    Last Vitals:  Vitals:   10/25/16 1100 10/25/16 1110  BP: (!) 116/56 (!) 133/51  Pulse: 73 73  Resp: 15 13  SpO2: 97% 96%    Last Pain:  Vitals:   10/25/16 0933  TempSrc: Oral   Pain Goal:                 [name],[name]

## 2016-10-26 NOTE — Progress Notes (Addendum)
Cardiology Office Note  Date:  10/26/2016   ID:  Mary Farrell, DOB 04-07-1945, MRN [id number]  PCP:  Lawerance Cruel, MD  Cardiologist:   Skeet Latch, MD   Chief Complaint  Patient presents with  . Follow-up    History of Present Illness: Mary Farrell is a 71 y.o. female with moderate aortic regurgitation, mild-moderate mitral stenosis, chronic pericardial effusion, paroxysmal atrial flutter, and hypertension who presents for follow up.  Mary Farrell was previously a patient of Dr. Mare Ferrari.  She followed up with Truitt Merle on 04/2015 due to shortness of breath that she felt was getting worse.  She was referred for an echo 04/2015 that revealed LVEF 55-60% with moderate AR and mild-moderate MS.  There was also a moderate pericardial effusion but no evidence of tamponade.  She had a heart cath 12/1994 that revealed normal coronaries with mild MS and mild pulmonary hypertension.  She also had a Cardiolite in 2007 that was negative for ischemia.  She was seen in clinic 07/2015 and reported persistent shortness of breath with minimal exertion and chest discomfort.  She was referred for exercise Myoview 08/26/15 that revealed LVEF 60% and no perfusion defects.    Mary Farrell was admitted 08/2015 with atrial flutter.  She had an echo that showed a persistent moderate-severe pericardial effusion but no tamponade.  She was started on amiodarone, which she did not tolerate due to nausea.  She underwent DCCV on 09/22/15 and then was started on flecainide.  She continued to report shortness of breath and was again referred for an echo that revealed LVEF 60-65% with grade 2 diastolic dysfunction, mild aortic regurgitation, and moderate mitral stenosis. She had a moderate pericardial effusion localized to the inferior and inferolateral walls. There was no evidence of tamponade.  At her last appointment Mary Farrell reported intermittent episodes of heart racing. She was seen in clinic 9/27 for an acute visit  due to atrial fibrillation.  She had an outpatient DCCV on 10/1 and converted with one shock at 200J.  Two hours after her cardioversion she started feeling her heart fluttering.  It was worse yesterday but she continues to have some fluttering.  She noted that her heart rate was 104 and she felt tired.  She also had nausea that was initially worse and has gradually improved.  She denies chest pain, shortness of breath, lightheadedness or dizziness.  She continues to feel as though her heart is out of rhythm, though it isn't as bothersome today as yesterday.  She continues to have a lot of stress due to her son being in prison and up for parole soon.   Past Medical History:  Diagnosis Date  . Aortic regurgitation 08/13/2015  . Arthritis    "hands, wrists, back, feet, toes" (09/18/2015)  . Atypical chest pain 05/13/2016  . Chest pain    remote cath in 1996 with NORMAL coronaries noted  . Exogenous obesity    history of   . GERD (gastroesophageal reflux disease)   . Glaucoma, both eyes   . Headache, migraine    "stopped in my 50's" (09/18/2015)  . History of cardiovascular stress test 2007   showing no ischemia  . Hypertension   . Mitral stenosis with insufficiency   . Murmur    of mitral stenosis and moderate aortic insufficiency      . Palpitations    occasional  . Personal history of rheumatic heart disease   . SBE (subacute bacterial endocarditis)  prophylaxis  . Sliding hiatal hernia     Past Surgical History:  Procedure Laterality Date  . ABDOMINAL HYSTERECTOMY  1975  . APPENDECTOMY  1977  . BUNIONECTOMY Right 2000s  . CARDIAC CATHETERIZATION  01/13/1995   normal coronary anatomy and mild mitral stenosis and mild pulmonary hypertension  . CARDIOVERSION N/A 09/22/2015   Procedure: CARDIOVERSION;  Surgeon: Jerline Pain, MD;  Location: [place];  Service: Cardiovascular;  Laterality: N/A;  . CATARACT EXTRACTION W/ INTRAOCULAR LENS  IMPLANT, BILATERAL Bilateral 2013  .  LAPAROSCOPIC CHOLECYSTECTOMY  1997  . TONSILLECTOMY AND ADENOIDECTOMY  1990s  . UVULOPALATOPHARYNGOPLASTY, TONSILLECTOMY AND SEPTOPLASTY  1990s     Current Outpatient Prescriptions  Medication Sig Dispense Refill  . ALPRAZolam (XANAX) 0.25 MG tablet Take 0.25 mg by mouth as needed for anxiety.    . B Complex Vitamins (VITAMIN B COMPLEX PO) Take 1 capsule by mouth daily.      . Calcium Carbonate (CALCIUM 500 PO) Take 500 mg by mouth daily.      . carvedilol (COREG) 6.25 MG tablet TAKE 1 TABLET BY MOUTH TWO  TIMES DAILY 180 tablet 1  . cetirizine (ZYRTEC) 10 MG tablet Take 5 mg by mouth at bedtime.     . Cholecalciferol (VITAMIN D) 2000 UNITS CAPS Take 2,000 Units by mouth daily.      Marland Kitchen conjugated estrogens (PREMARIN) vaginal cream Place 1 Applicatorful vaginally daily.    Mariane Baumgarten Calcium (STOOL SOFTENER PO) Take 1 tablet by mouth 2 (two) times daily.    . dorzolamide-timolol (COSOPT) 22.3-6.8 MG/ML ophthalmic solution PLACE 1 DROP INTO EACH EYE DAILY  6  . doxepin (SINEQUAN) 10 MG capsule Take 20 mg by mouth at bedtime.     Marland Kitchen ELIQUIS 5 MG TABS tablet TAKE 1 TABLET BY MOUTH TWO  TIMES DAILY 180 tablet 1  . flecainide (TAMBOCOR) 50 MG tablet Take 2 tablets (100 mg total) by mouth 2 (two) times daily. (Patient taking differently: Take 100 mg by mouth once. ) 180 tablet 1  . gabapentin (NEURONTIN) 300 MG capsule Take 300 mg by mouth 3 (three) times daily.    . hydrochlorothiazide (MICROZIDE) 12.5 MG capsule Take 12.5 mg by mouth daily.    Marland Kitchen latanoprost (XALATAN) 0.005 % ophthalmic solution Place 1 drop into both eyes daily.  6  . losartan (COZAAR) 100 MG tablet Take 100 mg by mouth daily.    . multivitamin (THERAGRAN) per tablet Take 1 tablet by mouth daily.      Marland Kitchen omeprazole (PRILOSEC) 40 MG capsule Take 40 mg by mouth daily.     . ondansetron (ZOFRAN) 4 MG tablet Take 1 tablet (4 mg total) by mouth every 8 (eight) hours as needed for nausea or vomiting. 20 tablet 0  . oxybutynin  (DITROPAN-XL) 10 MG 24 hr tablet Take 10 mg by mouth.     No current facility-administered medications for this visit.     Allergies:   Metoprolol and Oxaprozin    Social History:  The patient  reports that she has never smoked. She has never used smokeless tobacco. She reports that she does not drink alcohol or use drugs.   Family History:  The patient's  family history includes Diabetes in her brother; Heart attack in her mother; Heart failure in her maternal grandfather; Hypertension in her brother; Kidney disease in her brother.    ROS:  Please see the history of present illness.   Otherwise, review of systems are positive for none.  All other systems are reviewed and negative.    PHYSICAL EXAM: VS:  BP (!) 141/72   Pulse 74   Ht 5' (1.524 m)   Wt 72.5 kg (159 lb 12.8 oz)   BMI 31.21 kg/m  , BMI Body mass index is 31.21 kg/m. GENERAL:  Well appearing HEENT: Pupils equal round and reactive, fundi not visualized, oral mucosa unremarkable NECK:  No jugular venous distention, waveform within normal limits, carotid upstroke brisk and symmetric, no bruits, no thyromegaly LYMPHATICS:  No cervical adenopathy LUNGS:  Clear to auscultation bilaterally HEART:  RRR.  PMI not displaced or sustained,S1 and S2 within normal limits, + S3, no S4, no clicks, no rubs, III/VI diastolic murmur at the LUSB.  III/VI holosystolic murmur at the apex.   ABD:  Flat, positive bowel sounds normal in frequency in pitch, no bruits, no rebound, no guarding, no midline pulsatile mass, no hepatomegaly, no splenomegaly EXT:  2 plus pulses throughout, no edema, no cyanosis no clubbing SKIN:  No rashes no nodules NEURO:  Cranial nerves II through XII grossly intact, motor grossly intact throughout PSYCH:  Cognitively intact, oriented to person place and time    EKG:  EKG is ordered today. The ekg ordered 05/07/15 demonstraSinus bradycardia rate 57 bpm. 05/13/16: Sinus rhythm. Rate 66 bpm. 09/09/16: Sinus  rhythm. Rate 64 bpm.   10/26/16: Sinus rhythm.  Rate 74 bpm.  Exercise Myoveiw 08/26/15:  The left ventricular ejection fraction is normal (55-65%).  Nuclear stress EF: 60%.  Blood pressure demonstrated a hypertensive response to exercise.  Upsloping ST segment depression ST segment depression of 1 mm was noted during stress in the II, III, V6, V5 and aVF leads.  This is a low risk study.   No reversible ischemia. LVEF 60% with normal wall motion. Fair exercise tolerance. No chest pain. This is a low risk study.  Echo 05/28/16: Study Conclusions  - Left ventricle: The cavity size was normal. There was mild   concentric hypertrophy. Systolic function was normal. The   estimated ejection fraction was in the range of 60% to 65%. Wall   motion was normal; there were no regional wall motion   abnormalities. Features are consistent with a pseudonormal left   ventricular filling pattern, with concomitant abnormal relaxation   and increased filling pressure (grade 2 diastolic dysfunction).   Doppler parameters are consistent with elevated ventricular   end-diastolic filling pressure. - Aortic valve: Trileaflet; mildly thickened leaflets. There was   mild regurgitation. - Aortic root: The aortic root was normal in size. - Mitral valve: Calcified annulus. Moderately thickened, moderately   calcified leaflets . The findings are consistent with moderate   stenosis. There was mild regurgitation. Mean gradient (D): 6 mm   Hg. Peak gradient (D): 12 mm Hg. - Left atrium: The atrium was severely dilated. - Right ventricle: Systolic function was normal. - Tricuspid valve: There was mild regurgitation. - Inferior vena cava: The vessel was normal in size. - Pericardium, extracardiac: Features were not consistent with   tamponade physiology.  Impressions:  - LVEF 60-65%.   There was moderate localized pericardial effusion around the   inferior and inferolateral walls unchanged from prior  exam on   10/22/15. No evidence for tamponade.   Mitral valve is thickened and calcified, appears rheumatic with   anterior leaflet bowing. There is moderate mitral stenosis and   midl regurgitation.  Recent Labs: 10/25/2016: BUN 10; Creatinine, Ser 0.90; Hemoglobin 12.9; Potassium 3.7; Sodium 141   07/05/16:  Total cholesterol 167, triglycerides 159, HDL 47, LDL 88 TSH 2.2 Sodium 141, potassium 3.8, BUN 14, creatinine 0.77 AST 13, ALT 14  Lipid Panel No results found for: CHOL, TRIG, HDL, CHOLHDL, VLDL, LDLCALC, LDLDIRECT    Wt Readings from Last 3 Encounters:  10/26/16 72.5 kg (159 lb 12.8 oz)  10/25/16 73.5 kg (162 lb)  10/18/16 73.6 kg (162 lb 3.2 oz)     ASSESSMENT AND PLAN:  # Persitent Atrial flutter/fibrillation: Mary Farrell was very symptomatic in atrial flutter.  She underwent successful DCCV yesterday.  Her heart rate was initially elevated and irregular but she is now back in sinus rhythm.  It is unclear what rhythm she was in at home yesterday.  It seems that her treatment options are somewhat limited.  She hasn't tolerated amiodarone and had atrial arrhythmias despite flecainde.  We will refer her to EP to see if she may be an ablation candidate or could consider Tikosyn.  # Atypical chest pain: # Shortness of breath: # Pericardial effusion:   Stable. No tamponade noted on echo 05/2016.  Negative stress in 2017 and symptoms are unchanged.   # Mild-moderate mitral stenosis: # Moderate AR: # Rheumatic heart disease: Mean gradient 6 mmHg 05/2016.  # Hypertension: Stable.  Blood pressure is well-controlled.  Continue carvedilol, losartan, and HCTZ.     Current medicines are reviewed at length with the patient today.  The patient does not have concerns regarding medicines.  The following changes have been made:  None  Labs/ tests ordered today include:   Orders Placed This Encounter  Procedures  . Ambulatory referral to Cardiac Electrophysiology  . EKG 12-Lead       Disposition:   FU with Vennie Salsbury C. Oval Linsey, MD, [facility] in 6 months   This note was written with the assistance of speech recognition software.  Please excuse any transcriptional errors.  Signed, Tyrez Berrios C. Oval Linsey, MD, [facility]  10/26/2016 2:16 PM    [place]

## 2016-10-26 NOTE — Patient Instructions (Signed)
Medication Instructions:  Your physician recommends that you continue on your current medications as directed. Please refer to the Current Medication list given to you today.  Labwork: none  Testing/Procedures: none  Follow-Up: Your physician wants you to follow-up in: 6 month ov You will receive a reminder letter in the mail two months in advance. If you don't receive a letter, please call our office to schedule the follow-up appointment.  You have been referred to Electrophysiologist Dr Curt Bears or associate   If you need a refill on your cardiac medications before your next appointment, please call your pharmacy.

## 2016-10-27 ENCOUNTER — Ambulatory Visit: Payer: Medicare Other | Admitting: Physician Assistant

## 2016-11-02 DIAGNOSIS — R3121 Asymptomatic microscopic hematuria: Secondary | ICD-10-CM | POA: Insufficient documentation

## 2016-11-15 ENCOUNTER — Ambulatory Visit (INDEPENDENT_AMBULATORY_CARE_PROVIDER_SITE_OTHER): Payer: Medicare Other | Admitting: Cardiology

## 2016-11-15 ENCOUNTER — Encounter: Payer: Self-pay | Admitting: Cardiology

## 2016-11-15 VITALS — BP 140/82 | HR 74 | Ht 60.0 in | Wt 161.2 lb

## 2016-11-15 DIAGNOSIS — I10 Essential (primary) hypertension: Secondary | ICD-10-CM

## 2016-11-15 DIAGNOSIS — I481 Persistent atrial fibrillation: Secondary | ICD-10-CM | POA: Diagnosis not present

## 2016-11-15 DIAGNOSIS — I4819 Other persistent atrial fibrillation: Secondary | ICD-10-CM

## 2016-11-15 NOTE — Progress Notes (Signed)
Electrophysiology Office Note   Date:  11/15/2016   ID:  Mary, Farrell 1945/03/27, MRN [id number]  PCP:  Lawerance Cruel, MD  Cardiologist:  Oval Linsey Primary Electrophysiologist:  Carla Whilden Meredith Leeds, MD    Chief Complaint  Patient presents with  . New Patient (Initial Visit)    Persistent Afib     History of Present Illness: Mary Farrell is a 71 y.o. female who is being seen today for the evaluation of atrial fibrillation at the request of Lawerance Cruel, MD. Presenting today for electrophysiology evaluation. She has a history of moderate aortic regurgitation, mild to moderate mitral stenosis, chronic pericardial effusion, paroxysmal atrial flutter, and hypertension. She had a heart catheterization in 1996 revealed normal coronaries with mild MS and mild pulmonary hypertension. Exercise Myoview 09/14/2015 revealed any evidence 60% and no perfusion defects. She was admitted 09/14/2015 with atrial flutter. She was started on amiodarone but did not tolerate it due to nausea. She did wear cardioversion and was started on flecainide. She was seen on 10/21/16 for a visit due to atrial fibrillation. She had a cardioversion on 10/1. 2 hours later, she noted palpitations with a heart rate of 104. She has had one episode of atrial fibrillation since her diagnosis 13 months ago.    Today, she denies symptoms of palpitations, chest pain, shortness of breath, orthopnea, PND, lower extremity edema, claudication, dizziness, presyncope, syncope, bleeding, or neurologic sequela. The patient is tolerating medications without difficulties.    Past Medical History:  Diagnosis Date  . Aortic regurgitation 08/13/2015  . Arthritis    "hands, wrists, back, feet, toes" (09/18/2015)  . Atypical chest pain 05/13/2016  . Chest pain    remote cath in 1996 with NORMAL coronaries noted  . Exogenous obesity    history of   . GERD (gastroesophageal reflux disease)   . Glaucoma, both eyes   .  Headache, migraine    "stopped in my 50's" (09/18/2015)  . History of cardiovascular stress test 2007   showing no ischemia  . Hypertension   . Mitral stenosis with insufficiency   . Murmur    of mitral stenosis and moderate aortic insufficiency      . Palpitations    occasional  . Personal history of rheumatic heart disease   . SBE (subacute bacterial endocarditis)    prophylaxis  . Sliding hiatal hernia    Past Surgical History:  Procedure Laterality Date  . ABDOMINAL HYSTERECTOMY  1975  . APPENDECTOMY  1977  . BUNIONECTOMY Right 2000s  . CARDIAC CATHETERIZATION  01/13/1995   normal coronary anatomy and mild mitral stenosis and mild pulmonary hypertension  . CARDIOVERSION N/A 09/22/2015   Procedure: CARDIOVERSION;  Surgeon: Jerline Pain, MD;  Location: [place];  Service: Cardiovascular;  Laterality: N/A;  . CARDIOVERSION N/A 10/25/2016   Procedure: CARDIOVERSION;  Surgeon: Fay Records, MD;  Location: [facility] END[facility]COPY;  Service: Cardiovascular;  Laterality: N/A;  . CATARACT EXTRACTION W/ INTRAOCULAR LENS  IMPLANT, BILATERAL Bilateral 2013  . LAPAROSCOPIC CHOLECYSTECTOMY  1997  . TONSILLECTOMY AND ADENOIDECTOMY  1990s  . UVULOPALATOPHARYNGOPLASTY, TONSILLECTOMY AND SEPTOPLASTY  1990s     Current Outpatient Prescriptions  Medication Sig Dispense Refill  . ALPRAZolam (XANAX) 0.25 MG tablet Take 0.25 mg by mouth as needed for anxiety.    . B Complex Vitamins (VITAMIN B COMPLEX PO) Take 1 capsule by mouth daily.      . Farrell Carbonate (Farrell 500 PO) Take 500 mg by mouth daily.      Mary Kitchen  carvedilol (COREG) 6.25 MG tablet TAKE 1 TABLET BY MOUTH TWO  TIMES DAILY 180 tablet 1  . cetirizine (ZYRTEC) 10 MG tablet Take 5 mg by mouth at bedtime.     . Cholecalciferol (VITAMIN D) 2000 UNITS CAPS Take 2,000 Units by mouth daily.      Mary Kitchen conjugated estrogens (PREMARIN) vaginal cream Place 1 Applicatorful vaginally daily.    Mary Farrell Farrell (STOOL SOFTENER PO) Take 1 tablet by mouth 2  (two) times daily.    . dorzolamide-timolol (COSOPT) 22.3-6.8 MG/ML ophthalmic solution PLACE 1 DROP INTO EACH EYE DAILY  6  . doxepin (SINEQUAN) 25 MG capsule Take 25 mg by mouth at bedtime.  8  . ELIQUIS 5 MG TABS tablet TAKE 1 TABLET BY MOUTH TWO  TIMES DAILY 180 tablet 1  . flecainide (TAMBOCOR) 50 MG tablet Take 2 tablets (100 mg total) by mouth 2 (two) times daily. (Patient taking differently: Take 100 mg by mouth once. ) 180 tablet 1  . gabapentin (NEURONTIN) 300 MG capsule Take 300 mg by mouth 3 (three) times daily.    . hydrochlorothiazide (MICROZIDE) 12.5 MG capsule Take 12.5 mg by mouth daily.    Mary Kitchen latanoprost (XALATAN) 0.005 % ophthalmic solution Place 1 drop into both eyes daily.  6  . losartan (COZAAR) 100 MG tablet Take 100 mg by mouth daily.    . multivitamin (THERAGRAN) per tablet Take 1 tablet by mouth daily.      Mary Kitchen omeprazole (PRILOSEC) 40 MG capsule Take 40 mg by mouth daily.     . ondansetron (ZOFRAN) 4 MG tablet Take 1 tablet (4 mg total) by mouth every 8 (eight) hours as needed for nausea or vomiting. 20 tablet 0  . oxybutynin (DITROPAN-XL) 10 MG 24 hr tablet Take 10 mg by mouth.     No current facility-administered medications for this visit.     Allergies:   Metoprolol and Oxaprozin   Social History:  The patient  reports that she has never smoked. She has never used smokeless tobacco. She reports that she does not drink alcohol or use drugs.   Family History:  The patient's family history includes Diabetes in her brother; Heart attack in her mother; Heart failure in her maternal grandfather; Hypertension in her brother; Kidney disease in her brother.    ROS:  Please see the history of present illness.   Otherwise, review of systems is positive for back pain, rash.   All other systems are reviewed and negative.    PHYSICAL EXAM: VS:  BP 140/82   Pulse 74   Ht 5' (1.524 m)   Wt 161 lb 3.2 oz (73.1 kg)   SpO2 96%   BMI 31.48 kg/m  , BMI Body mass index is  31.48 kg/m. GEN: Well nourished, well developed, in no acute distress  HEENT: normal  Neck: no JVD, carotid bruits, or masses Cardiac: RRR; no murmurs, rubs, or gallops,no edema  Respiratory:  clear to auscultation bilaterally, normal work of breathing GI: soft, nontender, nondistended, + BS MS: no deformity or atrophy  Skin: warm and dry Neuro:  Strength and sensation are intact Psych: euthymic mood, full affect  EKG:  EKG is not ordered today. Personal review of the ekg ordered 10/26/16 shows sinus rhythm, rate 74  Recent Labs: 10/25/2016: BUN 10; Creatinine, Ser 0.90; Hemoglobin 12.9; Potassium 3.7; Sodium 141    Lipid Panel  No results found for: CHOL, TRIG, HDL, CHOLHDL, VLDL, LDLCALC, LDLDIRECT   Wt Readings from Last 3  Encounters:  11/15/16 161 lb 3.2 oz (73.1 kg)  10/26/16 159 lb 12.8 oz (72.5 kg)  10/25/16 162 lb (73.5 kg)      Other studies Reviewed: Additional studies/ records that were reviewed today include: TTE 05/28/16  Review of the above records today demonstrates:  - Left ventricle: The cavity size was normal. There was mild   concentric hypertrophy. Systolic function was normal. The   estimated ejection fraction was in the range of 60% to 65%. Wall   motion was normal; there were no regional wall motion   abnormalities. Features are consistent with a pseudonormal left   ventricular filling pattern, with concomitant abnormal relaxation   and increased filling pressure (grade 2 diastolic dysfunction).   Doppler parameters are consistent with elevated ventricular   end-diastolic filling pressure. - Aortic valve: Trileaflet; mildly thickened leaflets. There was   mild regurgitation. - Aortic root: The aortic root was normal in size. - Mitral valve: Calcified annulus. Moderately thickened, moderately   calcified leaflets . The findings are consistent with moderate   stenosis. There was mild regurgitation. Mean gradient (D): 6 mm   Hg. Peak gradient (D): 12  mm Hg. - Left atrium: The atrium was severely dilated. - Right ventricle: Systolic function was normal. - Tricuspid valve: There was mild regurgitation. - Inferior vena cava: The vessel was normal in size. - Pericardium, extracardiac: Features were not consistent with   tamponade physiology.   ASSESSMENT AND PLAN:  1.  Persistent atrial fibrillation/flutter: Has not tolerated amiodarone and has had recurrent atrial arrhythmias despite flecainide. She has had one episode in the last 13 months since her diagnosis of atrial fibrillation. I discussed with her further options of therapy including Tikosyn, Multaq, and ablation. Risks and benefits of all were discussed. She would like to think about adjusting medications which Mary Farrell be discussed at follow-up in 3 months.  This patients CHA2DS2-VASc Score and unadjusted Ischemic Stroke Rate (% per year) is equal to 3.2 % stroke rate/year from a score of 3  Above score calculated as 1 point each if present [CHF, HTN, DM, Vascular=MI/PAD/Aortic Plaque, Age if 65-74, or Female] Above score calculated as 2 points each if present [Age > 75, or Stroke/TIA/TE]  2. Pericardial effusion: Stable as per most recent echo without tamponade. No changes at this time.  3. Hypertension: Continue carvedilol, losartan, HCTZ.    Current medicines are reviewed at length with the patient today.   The patient does not have concerns regarding her medicines.  The following changes were made today:  none  Labs/ tests ordered today include:  No orders of the defined types were placed in this encounter.    Disposition:   FU with Mary Farrell 3 months  Signed, Mary Thwaites Meredith Leeds, MD  11/15/2016 2:53 PM     [place] [address] [place] [id number] [phone] (office) [phone] (fax)

## 2016-11-15 NOTE — Patient Instructions (Signed)
Medication Instructions:  Your physician recommends that you continue on your current medications as directed. Please refer to the Current Medication list given to you today.  Labwork: None ordered  Testing/Procedures: None ordered  Follow-Up: Your physician recommends that you schedule a follow-up appointment in: 3 months with Dr. Curt Bears.  -- If you need a refill on your cardiac medications before your next appointment, please call your pharmacy. --  Thank you for choosing [facility]!!   Trinidad Curet, RN [phone]  Any Other Special Instructions Will Be Listed Below (If Applicable). Below is information on ablation, Multaq & Tikosyn   Cardiac Ablation Cardiac ablation is a procedure to disable (ablate) a small amount of heart tissue in very specific places. The heart has many electrical connections. Sometimes these connections are abnormal and can cause the heart to beat very fast or irregularly. Ablating some of the problem areas can improve the heart rhythm or return it to normal. Ablation may be done for people who:  Have Wolff-Parkinson-White syndrome.  Have fast heart rhythms (tachycardia).  Have taken medicines for an abnormal heart rhythm (arrhythmia) that were not effective or caused side effects.  Have a high-risk heartbeat that may be life-threatening.  During the procedure, a small incision is made in the neck or the groin, and a long, thin, flexible tube (catheter) is inserted into the incision and moved to the heart. Small devices (electrodes) on the tip of the catheter will send out electrical currents. A type of X-ray (fluoroscopy) will be used to help guide the catheter and to provide images of the heart. Tell a health care provider about:  Any allergies you have.  All medicines you are taking, including vitamins, herbs, eye drops, creams, and over-the-counter medicines.  Any problems you or family members have had with anesthetic medicines.  Any  blood disorders you have.  Any surgeries you have had.  Any medical conditions you have, such as kidney failure.  Whether you are pregnant or may be pregnant. What are the risks? Generally, this is a safe procedure. However, problems may occur, including:  Infection.  Bruising and bleeding at the catheter insertion site.  Bleeding into the chest, especially into the sac that surrounds the heart. This is a serious complication.  Stroke or blood clots.  Damage to other structures or organs.  Allergic reaction to medicines or dyes.  Need for a permanent pacemaker if the normal electrical system is damaged. A pacemaker is a small computer that sends electrical signals to the heart and helps your heart beat normally.  The procedure not being fully effective. This may not be recognized until months later. Repeat ablation procedures are sometimes required.  What happens before the procedure?  Follow instructions from your health care provider about eating or drinking restrictions.  Ask your health care provider about: ? Changing or stopping your regular medicines. This is especially important if you are taking diabetes medicines or blood thinners. ? Taking medicines such as aspirin and ibuprofen. These medicines can thin your blood. Do not take these medicines before your procedure if your health care provider instructs you not to.  Plan to have someone take you home from the hospital or clinic.  If you will be going home right after the procedure, plan to have someone with you for 24 hours. What happens during the procedure?  To lower your risk of infection: ? Your health care team will wash or sanitize their hands. ? Your skin will be washed with  soap. ? Hair may be removed from the incision area.  An IV tube will be inserted into one of your veins.  You will be given a medicine to help you relax (sedative).  The skin on your neck or groin will be numbed.  An incision  will be made in your neck or your groin.  A needle will be inserted through the incision and into a large vein in your neck or groin.  A catheter will be inserted into the needle and moved to your heart.  Dye may be injected through the catheter to help your surgeon see the area of the heart that needs treatment.  Electrical currents will be sent from the catheter to ablate heart tissue in desired areas. There are three types of energy that may be used to ablate heart tissue: ? Heat (radiofrequency energy). ? Laser energy. ? Extreme cold (cryoablation).  When the necessary tissue has been ablated, the catheter will be removed.  Pressure will be held on the catheter insertion area to prevent excessive bleeding.  A bandage (dressing) will be placed over the catheter insertion area. The procedure may vary among health care providers and hospitals. What happens after the procedure?  Your blood pressure, heart rate, breathing rate, and blood oxygen level will be monitored until the medicines you were given have worn off.  Your catheter insertion area will be monitored for bleeding. You will need to lie still for a few hours to ensure that you do not bleed from the catheter insertion area.  Do not drive for 24 hours or as long as directed by your health care provider. Summary  Cardiac ablation is a procedure to disable (ablate) a small amount of heart tissue in very specific places. Ablating some of the problem areas can improve the heart rhythm or return it to normal.  During the procedure, electrical currents will be sent from the catheter to ablate heart tissue in desired areas. This information is not intended to replace advice given to you by your health care provider. Make sure you discuss any questions you have with your health care provider. Document Released: 05/30/2008 Document Revised: 12/01/2015 Document Reviewed: 12/01/2015 Elsevier Interactive Patient Education  2018  [employer].  Dronedarone tablets What is this medicine? DRONEDARONE (droe NE da rone) is an antiarrhythmic drug. It helps make your heart beat regularly. This medicine may be used for other purposes; ask your health care provider or pharmacist if you have questions. COMMON BRAND NAME(S): Multaq What should I tell my health care provider before I take this medicine? They need to know if you have any of these conditions: -heart failure -history of irregular heartbeat -liver disease -liver or lung problems with the past use of amiodarone -low levels of magnesium in the blood -low levels of potassium in the blood -other heart disease -an unusual or allergic reaction to dronedarone, other medicines, foods, dyes, or preservatives -pregnant or trying to get pregnant -breast-feeding How should I use this medicine? Take this medicine by mouth with a glass of water. Follow the directions on the prescription label. Take one tablet with the morning meal and one tablet with the evening meal. Do not take your medicine more often than directed. Do not stop taking except on the advice of your doctor or health care professional. A special MedGuide will be given to you by the pharmacist with each prescription and refill. Be sure to read this information carefully each time. Talk to your pediatrician regarding the use  of this medicine in children. Special care may be needed. Overdosage: If you think you have taken too much of this medicine contact a poison control center or emergency room at once. NOTE: This medicine is only for you. Do not share this medicine with others. What if I miss a dose? If you miss a dose, take it as soon as you can. If it is almost time for your next dose, take only that dose. Do not take double or extra doses. What may interact with this medicine? Do not take this medicine with any of the following medications: -arsenic trioxide -certain antibiotics like clarithromycin,  erythromycin, pentamidine, telithromycin, troleandomycin -certain medicines for depression like tricyclic antidepressants -certain medicines for fungal infections like fluconazole, itraconazole, ketoconazole, posaconazole, voriconazole -certain medicines for irregular heart beat like amiodarone, disopyramide, dofetilide, flecainide, ibutilide, quinidine, propafenone, sotalol -certain medicines for malaria like chloroquine, halofantrine -cisapride -cyclosporine -droperidol -haloperidol -methadone -other medicines that prolong the QT interval (cause an abnormal heart rhythm) -pimozide -nefazodone -phenothiazines like chlorpromazine, mesoridazine, prochlorperazine, thioridazine -ritonavir -ziprasidone This medicine may also interact with the following medications: -certain medicines for blood pressure, heart disease, or irregular heart beat like diltiazem, metoprolol, propranolol, verapamil -certain medicines for cholesterol like atorvastatin, lovastatin, simvastatin -certain medicines for seizures like carbamazepine, phenobarbital, phenytoin -digoxin -grapefruit juice -rifampin -sirolimus -St. John's Wort -tacrolimus This list may not describe all possible interactions. Give your health care provider a list of all the medicines, herbs, non-prescription drugs, or dietary supplements you use. Also tell them if you smoke, drink alcohol, or use illegal drugs. Some items may interact with your medicine. What should I watch for while using this medicine? Your condition will be monitored closely when you first begin therapy. Often, this drug is first started in a hospital or other monitored health care setting. Once you are on maintenance therapy, visit your doctor or health care professional for regular checks on your progress. Because your condition and use of this medicine carry some risk, it is a good idea to carry an identification card, necklace or bracelet with details of your condition,  medications, and doctor or health care professional. Dennis Bast may get drowsy or dizzy. Do not drive, use machinery, or do anything that needs mental alertness until you know how this medicine affects you. Do not stand or sit up quickly, especially if you are an older patient. This reduces the risk of dizzy or fainting spells. What side effects may I notice from receiving this medicine? Side effects that you should report to your doctor or health care professional as soon as possible: -allergic reactions like skin rash, itching or hives, swelling of the face, lips, or tongue -breathing problems -cough -dark urine -fast, irregular heartbeat -general ill feeling or flu-like symptoms -light-colored stools -loss of appetite, nausea -right upper belly pain -slow heartbeat -stomach pain -swelling of the legs or ankles -unusually weak or tired -weight gain -yellowing of the eyes or skin Side effects that usually do not require medical attention (report to your doctor or health care professional if they continue or are bothersome): -nausea -vomiting -stomach pain This list may not describe all possible side effects. Call your doctor for medical advice about side effects. You may report side effects to FDA at 1-800-FDA-1088. Where should I keep my medicine? Keep out of the reach of children. Store at room temperature between 15 and 30 degrees C (59 and 86 degrees F). Throw away any unused medicine after the expiration date. NOTE: This sheet is a summary.  It may not cover all possible information. If you have questions about this medicine, talk to your doctor, pharmacist, or health care provider.  2018 Elsevier/Gold Standard (2015-02-13 12:43:06)   Dofetilide capsules What is this medicine? DOFETILIDE (doe FET il ide) is an antiarrhythmic drug. It helps make your heart beat regularly. This medicine also helps to slow rapid heartbeats. This medicine may be used for other purposes; ask your health  care provider or pharmacist if you have questions. COMMON BRAND NAME(S): Tikosyn What should I tell my health care provider before I take this medicine? They need to know if you have any of these conditions: -heart disease -history of low levels of potassium or magnesium -kidney disease -liver disease -an unusual or allergic reaction to dofetilide, other medicines, foods, dyes, or preservatives -pregnant or trying to get pregnant -breast-feeding How should I use this medicine? Take this medicine by mouth with a glass of water. Follow the directions on the prescription label. You can take this medicine with or without food. Do not drink grapefruit juice with this medicine. Take your doses at regular intervals. Do not take your medicine more often than directed. Do not stop taking this medicine suddenly. This may cause serious, heart-related side effects. Your doctor will tell you how much medicine to take. If your doctor wants you to stop the medicine, the dose will be slowly lowered over time to avoid any side effects. Talk to your pediatrician regarding the use of this medicine in children. Special care may be needed. Overdosage: If you think you have taken too much of this medicine contact a poison control center or emergency room at once. NOTE: This medicine is only for you. Do not share this medicine with others. What if I miss a dose? If you miss a dose, take it as soon as you can. If it is almost time for your next dose, take only that dose. Do not take double or extra doses. What may interact with this medicine? Do not take this medicine with any of the following medications: -cimetidine -dolutegravir -hydrochlorothiazide alone or in combination with other medicines -isavuconazonium -ketoconazole -megestrol -other medicines that prolong the QT interval (cause an abnormal heart rhythm) -prochlorperazine -trimethoprim alone or in combination with sulfamethoxazole -verapamil This  medicine may also interact with the following medications: -amiloride -certain antidepressants like fluvoxamine or paroxetine -certain antiviral medicines for HIV or AIDS like atazanavir or darunavir -certain medicines for fungal infections like clotrimazole or miconazole -digoxin -diltiazem -dronabinol, THC -grapefruit juice -metformin -nefazodone -triamterene -zafirlukast This list may not describe all possible interactions. Give your health care provider a list of all the medicines, herbs, non-prescription drugs, or dietary supplements you use. Also tell them if you smoke, drink alcohol, or use illegal drugs. Some items may interact with your medicine. What should I watch for while using this medicine? Visit your doctor or health care professional for regular checks on your progress. Wear a medical ID bracelet or chain, and carry a card that describes your disease and details of your medicine and dosage times. Check your heart rate and blood pressure regularly while you are taking this medicine. Ask your doctor or health care professional what your heart rate and blood pressure should be, and when you should contact him or her. Your doctor or health care professional also may schedule regular tests to check your progress. You will be started on this medicine in a specialized facility for at least three days. You will be monitored to find the  right dose of medicine for you. It is very important that you take your medicine exactly as prescribed when you leave the hospital. The correct dosing of this medicine is very important to treat your condition and prevent possible serious side effects. What side effects may I notice from receiving this medicine? Side effects that you should report to your doctor or health care professional as soon as possible: -allergic reactions like skin rash, itching or hives, swelling of the face, lips, or tongue -breathing problems -dizziness -fast or rapid beating  of the heart -feeling faint or lightheaded -swelling of the ankles -unusually weak or tired -vomiting Side effects that usually do not require medical attention (report to your doctor or health care professional if they continue or are bothersome): -cough -diarrhea -difficulty sleeping -headache -nausea -stomach pain This list may not describe all possible side effects. Call your doctor for medical advice about side effects. You may report side effects to FDA at 1-800-FDA-1088. Where should I keep my medicine? Keep out of the reach of children. Store at room temperature between 15 and 30 degrees C (59 and 86 degrees F). Protect the medicine from moisture or humidity. Keep container tightly closed. Throw away any unused medicine after the expiration date. NOTE: This sheet is a summary. It may not cover all possible information. If you have questions about this medicine, talk to your doctor, pharmacist, or health care provider.  2018 Elsevier/Gold Standard (2015-11-24 14:10:30)

## 2016-12-02 ENCOUNTER — Other Ambulatory Visit: Payer: Self-pay | Admitting: Cardiovascular Disease

## 2016-12-02 NOTE — Telephone Encounter (Signed)
Please review for refill, Thanks !  

## 2016-12-22 ENCOUNTER — Encounter: Payer: Self-pay | Admitting: Cardiology

## 2016-12-30 ENCOUNTER — Ambulatory Visit (INDEPENDENT_AMBULATORY_CARE_PROVIDER_SITE_OTHER): Payer: Medicare Other | Admitting: Podiatry

## 2016-12-30 DIAGNOSIS — M19079 Primary osteoarthritis, unspecified ankle and foot: Secondary | ICD-10-CM

## 2016-12-31 ENCOUNTER — Telehealth: Payer: Self-pay | Admitting: Cardiology

## 2016-12-31 NOTE — Telephone Encounter (Signed)
Patient states that she has been experiencing intermittent episodes of her heart racing since Sunday night. She states that it always happens at night time. She states that she has Sleep Apnea and has to wear her CPAP, but Sunday night she was coughing a lot and was not able to wear it. She states that when she has these episodes of her heart racing at night that they do not feel irregular. She states that she does not check to see how high her HR gets during these episodes. She denies having any chest pain, SOB, lightheadedness, dizziness, or any other symptoms. She states that her BP this morning is 116/59 HR 74. She denies having any symptoms at this time. Patient states that she normally is able to take some deep breaths and take her Xanax and her symptoms improve. Patient takes carvedilol 6.25 mg BID, Flecainide 50 mg BID, HCTZ 12.5 mg QD, losartan 100 mg QD, and Eliquis. Patient has been unable to tolerate amiodarone in the past. Patient states that when she took Flecainide 100 mg BID that it did not seem to help. Patient has an appointment with Dr. Curt Bears on 02/24/17 to discuss her options further (ie. Multaq, Tikosyn, ablation). Instructed patient to continue to monitor her symptoms and let us know if they worsen. Made patient aware that the information would be forwarded to Dr. Curt Bears and his RN for further review and recommendation. Patient verbalized understanding and thanked me for the call.

## 2016-12-31 NOTE — Telephone Encounter (Signed)
Patient c/o Palpitations:  High priority if patient c/o lightheadedness, shortness of breath, or chest pain  1) How long have you had palpitations/irregular HR/ Afib? Are you having the symptoms now? no  2) Are you currently experiencing lightheadedness, SOB or CP? no  3) Do you have a history of afib (atrial fibrillation) or irregular heart rhythm?   4) Have you checked your BP or HR? (document readings if available):   5) Are you experiencing any other symptoms? No other symptoms reported   Patient states that she doesn't feel that its an irregular heartbeat, but at night her heart beat accelerates.

## 2017-01-04 NOTE — Telephone Encounter (Signed)
Called the patient back to discuss her palpitations. Improved with relaxation and low dose Xanax. No further changes. Necessary.

## 2017-01-12 ENCOUNTER — Ambulatory Visit: Payer: Medicare Other | Admitting: Cardiovascular Disease

## 2017-01-14 ENCOUNTER — Ambulatory Visit: Payer: Medicare Other | Admitting: Podiatry

## 2017-01-24 ENCOUNTER — Encounter: Payer: Self-pay | Admitting: Podiatry

## 2017-01-24 ENCOUNTER — Ambulatory Visit (INDEPENDENT_AMBULATORY_CARE_PROVIDER_SITE_OTHER): Payer: Medicare Other | Admitting: Podiatry

## 2017-01-24 DIAGNOSIS — M7752 Other enthesopathy of left foot: Secondary | ICD-10-CM

## 2017-01-24 DIAGNOSIS — M779 Enthesopathy, unspecified: Secondary | ICD-10-CM

## 2017-01-24 DIAGNOSIS — Z9889 Other specified postprocedural states: Secondary | ICD-10-CM

## 2017-01-24 MED ORDER — TRIAMCINOLONE ACETONIDE 10 MG/ML IJ SUSP
10.0000 mg | Freq: Once | INTRAMUSCULAR | Status: AC
  Administered 2017-01-24: 10 mg

## 2017-02-14 NOTE — Progress Notes (Signed)
  Subjective:  Patient ID: Mary Farrell, female    DOB: 30-May-1945,  MRN: [id number]  Chief Complaint  Patient presents with  . Foot Pain    bilateral - injections help - R s/p hammertoe repair 4th toe. Left, top of foot.   72 y.o. female returns for the above complaint.  Had fourth toe surgery with Dr. Paulla Dolly states the pain is returned.  Reports left top of foot pain since the injection helps.  Objective:  There were no vitals filed for this visit. General AA&O x3. Normal mood and affect.  Vascular Pedal pulses palpable.  Neurologic Epicritic sensation grossly intact.  Dermatologic No open lesions. Skin normal texture and turgor.  Orthopedic:  Pain palpation about the left midfoot   Assessment & Plan:  Patient was evaluated and treated and all questions answered.  Midfoot arthritis left -Injection delivered left midfoot  Procedure: Joint Injection Location: Left dorsal TMTs joint Skin Prep: Alcohol. Injectate: 0.5 cc 1% lidocaine plain, 0.5 cc dexamethasone phosphate. Disposition: Patient tolerated procedure well. Injection site dressed with a band-aid.    No Follow-up on file.

## 2017-02-15 ENCOUNTER — Other Ambulatory Visit: Payer: Self-pay | Admitting: Cardiovascular Disease

## 2017-02-15 NOTE — Telephone Encounter (Signed)
Refill Request.  

## 2017-02-16 NOTE — Telephone Encounter (Signed)
Rx has been sent to the pharmacy electronically. ° °

## 2017-02-18 ENCOUNTER — Telehealth: Payer: Self-pay | Admitting: Cardiology

## 2017-02-18 ENCOUNTER — Ambulatory Visit: Payer: Medicare Other | Admitting: Cardiology

## 2017-02-18 NOTE — Telephone Encounter (Signed)
Spoke with patient who states she felt some chest heaviness at bedtime last night, states "it just doesn't feel right." She states her vital signs yesterday 1/24 @ 5:40 pm were 113/51 mmHg, and pulse 72 bpm. She does not know what her HR was at bedtime just noticed that she did not feel well. She states she went to sleep and this morning @ 0730 BP was 103/61 mmHg and pulse 125 bpm. Statess he had mild chest heaviness.  I asked how she is feeling now and she states she feels weak, trembling and does not "feel right" when her HR is this high. I asked her to check her vital signs and she reports BP is 121/72 mmHg, pulse 102 bpm. She confirms that she is taking flecainide 50 mg BID and eliquis 5 mg BID. She states she would like to know what Trinidad Curet, RN, Dr. Macky Lower nurse thinks she should do. She states she would like to go to a funeral this afternoon but isn't sure she feels well enough to go. I advised that I will route message to [facility] for advice. She verbalized understanding and thanked me for the call.

## 2017-02-18 NOTE — Telephone Encounter (Signed)
Pt took Xanax and started deep breathing and now "has HR down to 89".  She still is SOB and doesn't feel right, but improved from this morning.  At 2:30 pm BP 92/53, HR 88.  Advised pt to increase Flecainide to 100 mg BID, per Dr. Curt Bears.  She is scheduled to follow up with him Thursday. She understands to call the office between now and next week's appt if increased dosing does not help. She will keep close eye on BP & HR.

## 2017-02-18 NOTE — Telephone Encounter (Signed)
F/U Call:  Patient calling for update. Patient would like some guidance before the weekend

## 2017-02-18 NOTE — Telephone Encounter (Signed)
Patient c/o Palpitations:  High priority if patient c/o lightheadedness, shortness of breath, or chest pain  1) How long have you had palpitations/irregular HR/ Afib? Are you having the symptoms now? yes  2) Are you currently experiencing lightheadedness, SOB or CP? No   3) Do you have a history of afib (atrial fibrillation) or irregular heart rhythm? yes  4.) Have you checked your BP or HR? (document readings if available): 125 this morning and it was 72 last night  5.) Are you experiencing any other symptoms? Chest tightness

## 2017-02-24 ENCOUNTER — Encounter: Payer: Self-pay | Admitting: *Deleted

## 2017-02-24 ENCOUNTER — Other Ambulatory Visit: Payer: Self-pay | Admitting: Cardiology

## 2017-02-24 ENCOUNTER — Encounter: Payer: Self-pay | Admitting: Cardiology

## 2017-02-24 ENCOUNTER — Ambulatory Visit (INDEPENDENT_AMBULATORY_CARE_PROVIDER_SITE_OTHER): Payer: Medicare Other | Admitting: Cardiology

## 2017-02-24 VITALS — BP 120/76 | HR 80 | Ht 60.0 in | Wt 164.0 lb

## 2017-02-24 DIAGNOSIS — I480 Paroxysmal atrial fibrillation: Secondary | ICD-10-CM | POA: Diagnosis not present

## 2017-02-24 DIAGNOSIS — I10 Essential (primary) hypertension: Secondary | ICD-10-CM

## 2017-02-24 DIAGNOSIS — I313 Pericardial effusion (noninflammatory): Secondary | ICD-10-CM

## 2017-02-24 DIAGNOSIS — I481 Persistent atrial fibrillation: Secondary | ICD-10-CM | POA: Diagnosis not present

## 2017-02-24 DIAGNOSIS — Z01812 Encounter for preprocedural laboratory examination: Secondary | ICD-10-CM | POA: Diagnosis not present

## 2017-02-24 DIAGNOSIS — I3139 Other pericardial effusion (noninflammatory): Secondary | ICD-10-CM

## 2017-02-24 DIAGNOSIS — I4819 Other persistent atrial fibrillation: Secondary | ICD-10-CM

## 2017-02-24 NOTE — Addendum Note (Signed)
Addended by: Stanton Kidney on: 02/24/2017 05:24 PM   Modules accepted: Orders

## 2017-02-24 NOTE — Patient Instructions (Addendum)
Medication Instructions:  Your physician recommends that you continue on your current medications as directed. Please refer to the Current Medication list given to you today.  * If you need a refill on your cardiac medications before your next appointment, please call your pharmacy. *  Labwork: Pre procedure labs today: BMET & CBC w/ diff  Testing/Procedures: Your physician has requested that you have cardiac CT within 7 days PRIOR to ablation. Cardiac computed tomography (CT) is a painless test that uses an x-ray machine to take clear, detailed pictures of your heart. For further information please visit [email or web address]. Please follow instruction sheet as given.  Your physician has recommended that you have an ablation. Catheter ablation is a medical procedure used to treat some cardiac arrhythmias (irregular heartbeats). During catheter ablation, a long, thin, flexible tube is put into a blood vessel in your groin (upper thigh), or neck. This tube is called an ablation catheter. It is then guided to your heart through the blood vessel. Radio frequency waves destroy small areas of heart tissue where abnormal heartbeats may cause an arrhythmia to start. Please see the instruction sheet given to you today.  Follow-Up: Your physician recommends that you schedule a follow-up appointment in: 4 weeks, after your procedure on 03/10/2017, with Roderic Palau NP in the AFib clinic.  Your physician recommends that you schedule a follow-up appointment in: 3 months, after your procedure on 03/10/2017, with Dr. Curt Bears.   Thank you for choosing [facility]!!   Trinidad Curet, RN [phone]  Any Other Special Instructions Will Be Listed Below (If Applicable).  CT INSTRUCTIONS Please arrive at the [facility] main entrance of [facility] at xx:xx AM (30-45 minutes prior to test start time)  [facility] [address] [place], [place] [address] [phone]  Proceed to  the [facility] Radiology Department (First Floor).  Please follow these instructions carefully (unless otherwise directed):  Hold all erectile dysfunction medications at least 48 hours prior to test.  On the Night Before the Test: . Drink plenty of water. . Do not consume any caffeinated/decaffeinated beverages or chocolate 12 hours prior to your test. . Do not take any antihistamines 12 hours prior to your test. . If you take Metformin do not take 24 hours prior to test. . If the patient has contrast allergy: ? Patient will need a prescription for Prednisone and very clear instructions (as follows): 1. Prednisone 50 mg - take 13 hours prior to test 2. Take another Prednisone 50 mg 7 hours prior to test 3. Take another Prednisone 50 mg 1 hour prior to test 4. Take Benadryl 50 mg 1 hour prior to test . Patient must complete all four doses of above prophylactic medications. . Patient will need a ride after test due to Benadryl.  On the Day of the Test: . Drink plenty of water. Do not drink any water within one hour of the test. . Do not eat any food 4 hours prior to the test. . You may take your regular medications prior to the test. . IF NOT ON A BETA BLOCKER - Take 50 mg of lopressor (metoprolol) one hour before the test. . HOLD Furosemide morning of the test.  After the Test: . Drink plenty of water. . After receiving IV contrast, you may experience a mild flushed feeling. This is normal. . On occasion, you may experience a mild rash up to 24 hours after the test. This is not dangerous. If this occurs, you  can take Benadryl 25 mg and increase your fluid intake. . If you experience trouble breathing, this can be serious. If it is severe call 911 IMMEDIATELY. If it is mild, please call our office. . If you take any of these medications: Glipizide/Metformin, Avandament, Glucavance, please do not take 48 hours after completing test.   Cardiac Ablation Cardiac ablation is a procedure  to disable (ablate) a small amount of heart tissue in very specific places. The heart has many electrical connections. Sometimes these connections are abnormal and can cause the heart to beat very fast or irregularly. Ablating some of the problem areas can improve the heart rhythm or return it to normal. Ablation may be done for people who:  Have Wolff-Parkinson-White syndrome.  Have fast heart rhythms (tachycardia).  Have taken medicines for an abnormal heart rhythm (arrhythmia) that were not effective or caused side effects.  Have a high-risk heartbeat that may be life-threatening.  During the procedure, a small incision is made in the neck or the groin, and a long, thin, flexible tube (catheter) is inserted into the incision and moved to the heart. Small devices (electrodes) on the tip of the catheter will send out electrical currents. A type of X-ray (fluoroscopy) will be used to help guide the catheter and to provide images of the heart. Tell a health care provider about:  Any allergies you have.  All medicines you are taking, including vitamins, herbs, eye drops, creams, and over-the-counter medicines.  Any problems you or family members have had with anesthetic medicines.  Any blood disorders you have.  Any surgeries you have had.  Any medical conditions you have, such as kidney failure.  Whether you are pregnant or may be pregnant. What are the risks? Generally, this is a safe procedure. However, problems may occur, including:  Infection.  Bruising and bleeding at the catheter insertion site.  Bleeding into the chest, especially into the sac that surrounds the heart. This is a serious complication.  Stroke or blood clots.  Damage to other structures or organs.  Allergic reaction to medicines or dyes.  Need for a permanent pacemaker if the normal electrical system is damaged. A pacemaker is a small computer that sends electrical signals to the heart and helps your  heart beat normally.  The procedure not being fully effective. This may not be recognized until months later. Repeat ablation procedures are sometimes required.  What happens before the procedure?  Follow instructions from your health care provider about eating or drinking restrictions.  Ask your health care provider about: ? Changing or stopping your regular medicines. This is especially important if you are taking diabetes medicines or blood thinners. ? Taking medicines such as aspirin and ibuprofen. These medicines can thin your blood. Do not take these medicines before your procedure if your health care provider instructs you not to.  Plan to have someone take you home from the hospital or clinic.  If you will be going home right after the procedure, plan to have someone with you for 24 hours. What happens during the procedure?  To lower your risk of infection: ? Your health care team will wash or sanitize their hands. ? Your skin will be washed with soap. ? Hair may be removed from the incision area.  An IV tube will be inserted into one of your veins.  You will be given a medicine to help you relax (sedative).  The skin on your neck or groin will be numbed.  An incision  will be made in your neck or your groin.  A needle will be inserted through the incision and into a large vein in your neck or groin.  A catheter will be inserted into the needle and moved to your heart.  Dye may be injected through the catheter to help your surgeon see the area of the heart that needs treatment.  Electrical currents will be sent from the catheter to ablate heart tissue in desired areas. There are three types of energy that may be used to ablate heart tissue: ? Heat (radiofrequency energy). ? Laser energy. ? Extreme cold (cryoablation).  When the necessary tissue has been ablated, the catheter will be removed.  Pressure will be held on the catheter insertion area to prevent excessive  bleeding.  A bandage (dressing) will be placed over the catheter insertion area. The procedure may vary among health care providers and hospitals. What happens after the procedure?  Your blood pressure, heart rate, breathing rate, and blood oxygen level will be monitored until the medicines you were given have worn off.  Your catheter insertion area will be monitored for bleeding. You will need to lie still for a few hours to ensure that you do not bleed from the catheter insertion area.  Do not drive for 24 hours or as long as directed by your health care provider. Summary  Cardiac ablation is a procedure to disable (ablate) a small amount of heart tissue in very specific places. Ablating some of the problem areas can improve the heart rhythm or return it to normal.  During the procedure, electrical currents will be sent from the catheter to ablate heart tissue in desired areas. This information is not intended to replace advice given to you by your health care provider. Make sure you discuss any questions you have with your health care provider. Document Released: 05/30/2008 Document Revised: 12/01/2015 Document Reviewed: 12/01/2015 Elsevier Interactive Patient Education  [employer].

## 2017-02-24 NOTE — Progress Notes (Signed)
Electrophysiology Office Note   Date:  02/24/2017   ID:  Mary Farrell, DOB 24-Nov-1945, MRN [id number]  PCP:  Lawerance Cruel, MD  Cardiologist:  Oval Linsey Primary Electrophysiologist:  Tyriek Hofman Mary Leeds, MD    Chief Complaint  Patient presents with  . Follow-up    PAF     History of Present Illness: Mary Farrell is a 72 y.o. female who is being seen today for the evaluation of atrial fibrillation at the request of Lawerance Cruel, MD. Presenting today for electrophysiology evaluation. She has a history of moderate aortic regurgitation, mild to moderate mitral stenosis, chronic pericardial effusion, paroxysmal atrial flutter, and hypertension. She had a heart catheterization in 1996 revealed normal coronaries with mild MS and mild pulmonary hypertension.  She was admitted 09/14/2015 with atrial flutter. She was started on amiodarone but did not tolerate it due to nausea.  He has had multiple cardioversions in the past.  Today, denies symptoms of palpitations, chest pain, shortness of breath, orthopnea, PND, lower extremity edema, claudication, dizziness, presyncope, syncope, bleeding, or neurologic sequela. The patient is tolerating medications without difficulties.  She continues to feel weak and fatigued.  She feels that she goes in and out of atrial fibrillation.  Her flecainide dose was increased to 100 mg twice a day, but she continues to have symptoms.   Past Medical History:  Diagnosis Date  . Aortic regurgitation 08/13/2015  . Arthritis    "hands, wrists, back, feet, toes" (09/18/2015)  . Atypical chest pain 05/13/2016  . Chest pain    remote cath in 1996 with NORMAL coronaries noted  . Exogenous obesity    history of   . GERD (gastroesophageal reflux disease)   . Glaucoma, both eyes   . Headache, migraine    "stopped in my 50's" (09/18/2015)  . History of cardiovascular stress test 2007   showing no ischemia  . Hypertension   . Mitral stenosis with insufficiency    . Murmur    of mitral stenosis and moderate aortic insufficiency      . Palpitations    occasional  . Personal history of rheumatic heart disease   . SBE (subacute bacterial endocarditis)    prophylaxis  . Sliding hiatal hernia    Past Surgical History:  Procedure Laterality Date  . ABDOMINAL HYSTERECTOMY  1975  . APPENDECTOMY  1977  . BUNIONECTOMY Right 2000s  . CARDIAC CATHETERIZATION  01/13/1995   normal coronary anatomy and mild mitral stenosis and mild pulmonary hypertension  . CARDIOVERSION N/A 09/22/2015   Procedure: CARDIOVERSION;  Surgeon: Jerline Pain, MD;  Location: [place];  Service: Cardiovascular;  Laterality: N/A;  . CARDIOVERSION N/A 10/25/2016   Procedure: CARDIOVERSION;  Surgeon: Fay Records, MD;  Location: [facility] ENDOSCOPY;  Service: Cardiovascular;  Laterality: N/A;  . CATARACT EXTRACTION W/ INTRAOCULAR LENS  IMPLANT, BILATERAL Bilateral 2013  . LAPAROSCOPIC CHOLECYSTECTOMY  1997  . TONSILLECTOMY AND ADENOIDECTOMY  1990s  . UVULOPALATOPHARYNGOPLASTY, TONSILLECTOMY AND SEPTOPLASTY  1990s     Current Outpatient Medications  Medication Sig Dispense Refill  . ALPRAZolam (XANAX) 0.25 MG tablet Take 0.25 mg by mouth as needed for anxiety.    . B Complex Vitamins (VITAMIN B COMPLEX PO) Take 1 capsule by mouth daily.      . Calcium Carbonate (CALCIUM 500 PO) Take 500 mg by mouth daily.      . carvedilol (COREG) 6.25 MG tablet TAKE 1 TABLET BY MOUTH TWO  TIMES DAILY 180 tablet 2  .  Cholecalciferol (VITAMIN D) 2000 UNITS CAPS Take 2,000 Units by mouth daily.      Marland Kitchen conjugated estrogens (PREMARIN) vaginal cream Place 1 Applicatorful vaginally daily.    Mariane Baumgarten Calcium (STOOL SOFTENER PO) Take 1 tablet by mouth 2 (two) times daily.    . dorzolamide-timolol (COSOPT) 22.3-6.8 MG/ML ophthalmic solution PLACE 1 DROP INTO EACH EYE DAILY  6  . doxepin (SINEQUAN) 25 MG capsule Take 25 mg by mouth at bedtime.  8  . ELIQUIS 5 MG TABS tablet TAKE 1 TABLET BY MOUTH TWO   TIMES DAILY 180 tablet 1  . flecainide (TAMBOCOR) 50 MG tablet Take 2 tablets (100 mg total) by mouth 2 (two) times daily. (Patient taking differently: Take 100 mg by mouth once. ) 180 tablet 1  . flecainide (TAMBOCOR) 50 MG tablet TAKE 1 TABLET BY MOUTH TWO  TIMES DAILY 180 tablet 2  . gabapentin (NEURONTIN) 300 MG capsule Take 300 mg by mouth 3 (three) times daily.    . hydrochlorothiazide (MICROZIDE) 12.5 MG capsule TAKE 1 CAPSULE BY MOUTH  DAILY 90 capsule 3  . latanoprost (XALATAN) 0.005 % ophthalmic solution Place 1 drop into both eyes daily.  6  . losartan (COZAAR) 100 MG tablet Take 100 mg by mouth daily.    . multivitamin (THERAGRAN) per tablet Take 1 tablet by mouth daily.      Marland Kitchen omeprazole (PRILOSEC) 40 MG capsule Take 40 mg by mouth daily.     . ondansetron (ZOFRAN) 4 MG tablet Take 1 tablet (4 mg total) by mouth every 8 (eight) hours as needed for nausea or vomiting. 20 tablet 0  . oxybutynin (DITROPAN-XL) 10 MG 24 hr tablet Take 10 mg by mouth.     No current facility-administered medications for this visit.     Allergies:   Metoprolol and Oxaprozin   Social History:  The patient  reports that  has never smoked. she has never used smokeless tobacco. She reports that she does not drink alcohol or use drugs.   Family History:  The patient's family history includes Diabetes in her brother; Heart attack in her mother; Heart failure in her maternal grandfather; Hypertension in her brother; Kidney disease in her brother.    ROS:  Please see the history of present illness.   Otherwise, review of systems is positive for palpitations.   All other systems are reviewed and negative.   PHYSICAL EXAM: VS:  BP 120/76   Pulse 80   Ht 5' (1.524 m)   Wt 164 lb (74.4 kg)   BMI 32.03 kg/m  , BMI Body mass index is 32.03 kg/m. GEN: Well nourished, well developed, in no acute distress  HEENT: normal  Neck: no JVD, carotid bruits, or masses Cardiac: RRR; no murmurs, rubs, or gallops,no  edema  Respiratory:  clear to auscultation bilaterally, normal work of breathing GI: soft, nontender, nondistended, + BS MS: no deformity or atrophy  Skin: warm and dry Neuro:  Strength and sensation are intact Psych: euthymic mood, full affect  EKG:  EKG is not ordered today. Personal review of the ekg ordered 10/26/16 shows SR, rate 74   Recent Labs: 10/25/2016: BUN 10; Creatinine, Ser 0.90; Hemoglobin 12.9; Potassium 3.7; Sodium 141    Lipid Panel  No results found for: CHOL, TRIG, HDL, CHOLHDL, VLDL, LDLCALC, LDLDIRECT   Wt Readings from Last 3 Encounters:  02/24/17 164 lb (74.4 kg)  11/15/16 161 lb 3.2 oz (73.1 kg)  10/26/16 159 lb 12.8 oz (72.5 kg)  Other studies Reviewed: Additional studies/ records that were reviewed today include: TTE 05/28/16  Review of the above records today demonstrates:  - Left ventricle: The cavity size was normal. There was mild   concentric hypertrophy. Systolic function was normal. The   estimated ejection fraction was in the range of 60% to 65%. Wall   motion was normal; there were no regional wall motion   abnormalities. Features are consistent with a pseudonormal left   ventricular filling pattern, with concomitant abnormal relaxation   and increased filling pressure (grade 2 diastolic dysfunction).   Doppler parameters are consistent with elevated ventricular   end-diastolic filling pressure. - Aortic valve: Trileaflet; mildly thickened leaflets. There was   mild regurgitation. - Aortic root: The aortic root was normal in size. - Mitral valve: Calcified annulus. Moderately thickened, moderately   calcified leaflets . The findings are consistent with moderate   stenosis. There was mild regurgitation. Mean gradient (D): 6 mm   Hg. Peak gradient (D): 12 mm Hg. - Left atrium: The atrium was severely dilated. - Right ventricle: Systolic function was normal. - Tricuspid valve: There was mild regurgitation. - Inferior vena cava: The  vessel was normal in size. - Pericardium, extracardiac: Features were not consistent with   tamponade physiology.   ASSESSMENT AND PLAN:  1.  Persistent atrial fibrillation/flutter: Not tolerated amiodarone and has had recurrent arrhythmia since being on flecainide.  Discussed ablation.  Risks and benefits include bleeding, tamponade, heart block, stroke, and damage to surrounding organs, among others.  She understands these risks and has agreed to the procedure.  This patients CHA2DS2-VASc Score and unadjusted Ischemic Stroke Rate (% per year) is equal to 3.2 % stroke rate/year from a score of 3  Above score calculated as 1 point each if present [CHF, HTN, DM, Vascular=MI/PAD/Aortic Plaque, Age if 65-74, or Female] Above score calculated as 2 points each if present [Age > 75, or Stroke/TIA/TE]  2. Pericardial effusion: Stable as per most recent echo without tamponade physiology.  No changes.  3. Hypertension: Continue carvedilol, losartan, HCTZ.    Current medicines are reviewed at length with the patient today.   The patient does not have concerns regarding her medicines.  The following changes were made today:  none  Labs/ tests ordered today include:  Orders Placed This Encounter  Procedures  . CT CARDIAC MORPH/PULM VEIN W/CM&W/O CA SCORE  . CT CORONARY FRACTIONAL FLOW RESERVE DATA PREP  . CT CORONARY FRACTIONAL FLOW RESERVE FLUID ANALYSIS  . Basic Metabolic Panel (BMET)  . CBC w/Diff     Disposition:   FU with Alaa Eyerman 3 months  Signed, Janthony Holleman Mary Leeds, MD  02/24/2017 5:04 PM     [address] [place] [address] [phone] (office) [phone] (fax)

## 2017-02-25 ENCOUNTER — Other Ambulatory Visit: Payer: Medicare Other | Admitting: *Deleted

## 2017-02-25 ENCOUNTER — Encounter: Payer: Self-pay | Admitting: Cardiology

## 2017-02-26 LAB — CBC WITH DIFFERENTIAL/PLATELET
BASOS ABS: 0 10*3/uL (ref 0.0–0.2)
Basos: 0 %
EOS (ABSOLUTE): 0.1 10*3/uL (ref 0.0–0.4)
Eos: 1 %
HEMATOCRIT: 40.7 % (ref 34.0–46.6)
HEMOGLOBIN: 14.1 g/dL (ref 11.1–15.9)
Immature Grans (Abs): 0 10*3/uL (ref 0.0–0.1)
Immature Granulocytes: 0 %
LYMPHS ABS: 2.1 10*3/uL (ref 0.7–3.1)
Lymphs: 27 %
MCH: 32.6 pg (ref 26.6–33.0)
MCHC: 34.6 g/dL (ref 31.5–35.7)
MCV: 94 fL (ref 79–97)
MONOCYTES: 6 %
Monocytes Absolute: 0.4 10*3/uL (ref 0.1–0.9)
NEUTROS ABS: 5.3 10*3/uL (ref 1.4–7.0)
Neutrophils: 66 %
Platelets: 206 10*3/uL (ref 150–379)
RBC: 4.32 x10E6/uL (ref 3.77–5.28)
RDW: 14.3 % (ref 12.3–15.4)
WBC: 8 10*3/uL (ref 3.4–10.8)

## 2017-02-26 LAB — BASIC METABOLIC PANEL
BUN/Creatinine Ratio: 15 (ref 12–28)
BUN: 12 mg/dL (ref 8–27)
CALCIUM: 9.4 mg/dL (ref 8.7–10.3)
CO2: 23 mmol/L (ref 20–29)
CREATININE: 0.81 mg/dL (ref 0.57–1.00)
Chloride: 99 mmol/L (ref 96–106)
GFR calc Af Amer: 83 mL/min/{1.73_m2} (ref >59)
GFR, EST NON AFRICAN AMERICAN: 72 mL/min/{1.73_m2} (ref >59)
Glucose: 99 mg/dL (ref 65–99)
Potassium: 4.3 mmol/L (ref 3.5–5.2)
Sodium: 138 mmol/L (ref 134–144)

## 2017-03-04 ENCOUNTER — Encounter (HOSPITAL_COMMUNITY): Payer: Self-pay

## 2017-03-04 ENCOUNTER — Ambulatory Visit (HOSPITAL_COMMUNITY)
Admission: RE | Admit: 2017-03-04 | Discharge: 2017-03-04 | Disposition: A | Discharge status: Home / Self Care | Payer: Medicare Other | Source: Ambulatory Visit | Attending: Cardiology | Admitting: Cardiology

## 2017-03-04 ENCOUNTER — Telehealth: Payer: Self-pay | Admitting: Cardiology

## 2017-03-04 ENCOUNTER — Encounter: Payer: Self-pay | Admitting: Cardiology

## 2017-03-04 DIAGNOSIS — I4819 Other persistent atrial fibrillation: Secondary | ICD-10-CM

## 2017-03-04 DIAGNOSIS — J9811 Atelectasis: Secondary | ICD-10-CM | POA: Diagnosis not present

## 2017-03-04 DIAGNOSIS — I313 Pericardial effusion (noninflammatory): Secondary | ICD-10-CM | POA: Insufficient documentation

## 2017-03-04 DIAGNOSIS — I4892 Unspecified atrial flutter: Secondary | ICD-10-CM | POA: Diagnosis not present

## 2017-03-04 DIAGNOSIS — I481 Persistent atrial fibrillation: Secondary | ICD-10-CM | POA: Diagnosis not present

## 2017-03-04 MED ORDER — DILTIAZEM HCL 25 MG/5ML IV SOLN
5.0000 mg | INTRAVENOUS | Status: AC | PRN
  Administered 2017-03-04 (×2): 5 mg via INTRAVENOUS
  Filled 2017-03-04: qty 5

## 2017-03-04 MED ORDER — IOPAMIDOL (ISOVUE-370) INJECTION 76%
INTRAVENOUS | Status: AC
  Administered 2017-03-04: 80 mL via INTRAVENOUS
  Filled 2017-03-04: qty 100

## 2017-03-04 NOTE — Telephone Encounter (Signed)
Pt scheduled for AFib ablation next week. Pt feeling ok/better at this time. She feels bad a lot - but this has been ongoing d/t PAF. Reviewed with Dr. Curt Bears.  Pt verifies she took her Losartan this morning, even though her pressure is low.  Advised patient to hold off on this medication tomorrow morning if pressure is still low.  Informed that she may continue to experience low BPs today being that she took her BP medication. Pt is currently taking Flecainide 100 mg BID (I reported 50 mg BID to Dr. Curt Bears when I spoke to him).  Advised patient to call the office over the weekend if she continued to experience elevated HRs to get advisement if safe to take extra Flecainide if needed.  Patient verbalized understanding and agreeable to plan.

## 2017-03-04 NOTE — Telephone Encounter (Signed)
New message     Patient calling with BP concerns, SOB and weakness. Please call   Pt c/o BP issue: STAT if pt c/o blurred vision, one-sided weakness or slurred speech  1. What are your last 5 BP readings? 89/55 pulse 115, 98/62 pulse 153  2. Are you having any other symptoms (ex. Dizziness, headache, blurred vision, passed out)? No  3. What is your BP issue? Low BP    Pt c/o Shortness Of Breath: STAT if SOB developed within the last 24 hours or pt is noticeably SOB on the phone  1. Are you currently SOB (can you hear that pt is SOB on the phone)? YES  2. How long have you been experiencing SOB? weeks  3. Are you SOB when sitting or when up moving around? Sitting and moving  4. Are you currently experiencing any other symptoms? weak

## 2017-03-07 ENCOUNTER — Encounter: Payer: Self-pay | Admitting: Cardiology

## 2017-03-10 ENCOUNTER — Encounter (HOSPITAL_COMMUNITY)
Admission: RE | Disposition: A | Discharge status: Home / Self Care | Payer: Self-pay | Source: Ambulatory Visit | Attending: Cardiology

## 2017-03-10 ENCOUNTER — Ambulatory Visit (HOSPITAL_COMMUNITY): Payer: Medicare Other | Admitting: Certified Registered"

## 2017-03-10 ENCOUNTER — Ambulatory Visit (HOSPITAL_COMMUNITY)
Admission: RE | Admit: 2017-03-10 | Discharge: 2017-03-11 | Disposition: A | Discharge status: Home / Self Care | Payer: Medicare Other | Source: Ambulatory Visit | Attending: Cardiology | Admitting: Cardiology

## 2017-03-10 ENCOUNTER — Encounter (HOSPITAL_COMMUNITY): Payer: Self-pay | Admitting: Certified Registered"

## 2017-03-10 ENCOUNTER — Other Ambulatory Visit: Payer: Self-pay

## 2017-03-10 DIAGNOSIS — I483 Typical atrial flutter: Secondary | ICD-10-CM | POA: Insufficient documentation

## 2017-03-10 DIAGNOSIS — Z79899 Other long term (current) drug therapy: Secondary | ICD-10-CM | POA: Diagnosis not present

## 2017-03-10 DIAGNOSIS — I10 Essential (primary) hypertension: Secondary | ICD-10-CM | POA: Diagnosis not present

## 2017-03-10 DIAGNOSIS — Z7901 Long term (current) use of anticoagulants: Secondary | ICD-10-CM | POA: Diagnosis not present

## 2017-03-10 DIAGNOSIS — I481 Persistent atrial fibrillation: Secondary | ICD-10-CM | POA: Insufficient documentation

## 2017-03-10 DIAGNOSIS — M1990 Unspecified osteoarthritis, unspecified site: Secondary | ICD-10-CM | POA: Diagnosis not present

## 2017-03-10 DIAGNOSIS — I4819 Other persistent atrial fibrillation: Secondary | ICD-10-CM | POA: Diagnosis present

## 2017-03-10 HISTORY — PX: ATRIAL FIBRILLATION ABLATION: EP1191

## 2017-03-10 HISTORY — DX: Other persistent atrial fibrillation: I48.19

## 2017-03-10 LAB — POCT ACTIVATED CLOTTING TIME: Activated Clotting Time: 142 seconds

## 2017-03-10 SURGERY — ATRIAL FIBRILLATION ABLATION
Anesthesia: General

## 2017-03-10 MED ORDER — HEPARIN (PORCINE) IN NACL 2-0.9 UNIT/ML-% IJ SOLN
INTRAMUSCULAR | Status: AC
  Filled 2017-03-10: qty 500

## 2017-03-10 MED ORDER — ONDANSETRON HCL 4 MG/2ML IJ SOLN: 4.0000 mg | Freq: Four times a day (QID) | INTRAMUSCULAR | Status: DC | PRN

## 2017-03-10 MED ORDER — GABAPENTIN 300 MG PO CAPS
300.0000 mg | ORAL_CAPSULE | Freq: Three times a day (TID) | ORAL | Status: DC
  Administered 2017-03-10 – 2017-03-11 (×3): 300 mg via ORAL
  Filled 2017-03-10 (×3): qty 1

## 2017-03-10 MED ORDER — SODIUM CHLORIDE 0.9% FLUSH: 3.0000 mL | INTRAVENOUS | Status: DC | PRN

## 2017-03-10 MED ORDER — ROCURONIUM BROMIDE 100 MG/10ML IV SOLN
INTRAVENOUS | Status: DC | PRN
  Administered 2017-03-10: 50 mg via INTRAVENOUS
  Administered 2017-03-10: 10 mg via INTRAVENOUS

## 2017-03-10 MED ORDER — SUGAMMADEX SODIUM 200 MG/2ML IV SOLN
INTRAVENOUS | Status: DC | PRN
  Administered 2017-03-10: 150 mg via INTRAVENOUS

## 2017-03-10 MED ORDER — PROPOFOL 10 MG/ML IV BOLUS
INTRAVENOUS | Status: DC | PRN
  Administered 2017-03-10: 150 mg via INTRAVENOUS

## 2017-03-10 MED ORDER — DOXEPIN HCL 25 MG PO CAPS
25.0000 mg | ORAL_CAPSULE | Freq: Every day | ORAL | Status: DC
  Administered 2017-03-10: 25 mg via ORAL
  Filled 2017-03-10: qty 1

## 2017-03-10 MED ORDER — HEPARIN SODIUM (PORCINE) 1000 UNIT/ML IJ SOLN
INTRAMUSCULAR | Status: DC | PRN
  Administered 2017-03-10: 1000 [IU] via INTRAVENOUS

## 2017-03-10 MED ORDER — FENTANYL CITRATE (PF) 100 MCG/2ML IJ SOLN
INTRAMUSCULAR | Status: DC | PRN
  Administered 2017-03-10 (×2): 50 ug via INTRAVENOUS

## 2017-03-10 MED ORDER — LOSARTAN POTASSIUM 50 MG PO TABS
100.0000 mg | ORAL_TABLET | Freq: Every day | ORAL | Status: DC
  Administered 2017-03-11: 100 mg via ORAL
  Filled 2017-03-10: qty 2

## 2017-03-10 MED ORDER — HEPARIN (PORCINE) IN NACL 2-0.9 UNIT/ML-% IJ SOLN
INTRAMUSCULAR | Status: AC | PRN
  Administered 2017-03-10: 1000 mL
  Administered 2017-03-10 (×3): 500 mL

## 2017-03-10 MED ORDER — BUPIVACAINE HCL (PF) 0.25 % IJ SOLN
INTRAMUSCULAR | Status: AC
  Filled 2017-03-10: qty 30

## 2017-03-10 MED ORDER — BUPIVACAINE HCL (PF) 0.25 % IJ SOLN
INTRAMUSCULAR | Status: DC | PRN
  Administered 2017-03-10: 30 mL

## 2017-03-10 MED ORDER — SODIUM CHLORIDE 0.9 % IV SOLN
INTRAVENOUS | Status: DC | PRN
  Administered 2017-03-10: 30 ug/min via INTRAVENOUS

## 2017-03-10 MED ORDER — LACTATED RINGERS IV SOLN
INTRAVENOUS | Status: DC | PRN
  Administered 2017-03-10 (×2): via INTRAVENOUS

## 2017-03-10 MED ORDER — LIDOCAINE 2% (20 MG/ML) 5 ML SYRINGE
INTRAMUSCULAR | Status: DC | PRN
  Administered 2017-03-10: 40 mg via INTRAVENOUS

## 2017-03-10 MED ORDER — DOBUTAMINE IN D5W 4-5 MG/ML-% IV SOLN
INTRAVENOUS | Status: DC | PRN
  Administered 2017-03-10: 20 ug/kg/min via INTRAVENOUS

## 2017-03-10 MED ORDER — ONDANSETRON HCL 4 MG/2ML IJ SOLN
INTRAMUSCULAR | Status: DC | PRN
  Administered 2017-03-10: 4 mg via INTRAVENOUS

## 2017-03-10 MED ORDER — PANTOPRAZOLE SODIUM 40 MG PO TBEC
40.0000 mg | DELAYED_RELEASE_TABLET | Freq: Every day | ORAL | Status: DC
  Administered 2017-03-10 – 2017-03-11 (×2): 40 mg via ORAL
  Filled 2017-03-10 (×2): qty 1

## 2017-03-10 MED ORDER — HEPARIN SODIUM (PORCINE) 1000 UNIT/ML IJ SOLN
INTRAMUSCULAR | Status: AC
  Filled 2017-03-10: qty 1

## 2017-03-10 MED ORDER — APIXABAN 5 MG PO TABS
5.0000 mg | ORAL_TABLET | Freq: Two times a day (BID) | ORAL | Status: DC
  Administered 2017-03-10 – 2017-03-11 (×2): 5 mg via ORAL
  Filled 2017-03-10 (×2): qty 1

## 2017-03-10 MED ORDER — HEPARIN SODIUM (PORCINE) 1000 UNIT/ML IJ SOLN
INTRAMUSCULAR | Status: DC | PRN
  Administered 2017-03-10: 13000 [IU] via INTRAVENOUS
  Administered 2017-03-10: 2000 [IU] via INTRAVENOUS

## 2017-03-10 MED ORDER — CARVEDILOL 6.25 MG PO TABS
6.2500 mg | ORAL_TABLET | Freq: Two times a day (BID) | ORAL | Status: DC
  Administered 2017-03-10 – 2017-03-11 (×2): 6.25 mg via ORAL
  Filled 2017-03-10 (×2): qty 1

## 2017-03-10 MED ORDER — HYDROCHLOROTHIAZIDE 12.5 MG PO CAPS
12.5000 mg | ORAL_CAPSULE | Freq: Every day | ORAL | Status: DC
  Administered 2017-03-11: 12.5 mg via ORAL
  Filled 2017-03-10: qty 1

## 2017-03-10 MED ORDER — ALPRAZOLAM 0.25 MG PO TABS
0.1250 mg | ORAL_TABLET | Freq: Two times a day (BID) | ORAL | Status: DC | PRN
  Administered 2017-03-10: 0.125 mg via ORAL
  Filled 2017-03-10: qty 1

## 2017-03-10 MED ORDER — SODIUM CHLORIDE 0.9% FLUSH
3.0000 mL | Freq: Two times a day (BID) | INTRAVENOUS | Status: DC
  Administered 2017-03-10 – 2017-03-11 (×3): 3 mL via INTRAVENOUS

## 2017-03-10 MED ORDER — ESTROGENS, CONJUGATED 0.625 MG/GM VA CREA
1.0000 | TOPICAL_CREAM | VAGINAL | Status: DC
  Filled 2017-03-10: qty 30

## 2017-03-10 MED ORDER — DOCUSATE SODIUM 100 MG PO CAPS
100.0000 mg | ORAL_CAPSULE | Freq: Two times a day (BID) | ORAL | Status: DC
  Administered 2017-03-10 – 2017-03-11 (×2): 100 mg via ORAL
  Filled 2017-03-10 (×2): qty 1

## 2017-03-10 MED ORDER — PROTAMINE SULFATE 10 MG/ML IV SOLN
INTRAVENOUS | Status: DC | PRN
  Administered 2017-03-10: 40 mg via INTRAVENOUS

## 2017-03-10 MED ORDER — LATANOPROST 0.005 % OP SOLN
1.0000 [drp] | Freq: Every day | OPHTHALMIC | Status: DC
  Administered 2017-03-10: 1 [drp] via OPHTHALMIC
  Filled 2017-03-10: qty 2.5

## 2017-03-10 MED ORDER — SODIUM CHLORIDE 0.9 % IV SOLN: 250.0000 mL | INTRAVENOUS | Status: DC | PRN

## 2017-03-10 MED ORDER — OXYBUTYNIN CHLORIDE ER 10 MG PO TB24
10.0000 mg | ORAL_TABLET | Freq: Every day | ORAL | Status: DC | PRN
  Administered 2017-03-10 – 2017-03-11 (×2): 10 mg via ORAL
  Filled 2017-03-10 (×3): qty 1

## 2017-03-10 MED ORDER — VITAMIN D 1000 UNITS PO TABS
2000.0000 [IU] | ORAL_TABLET | Freq: Every day | ORAL | Status: DC
  Administered 2017-03-11: 2000 [IU] via ORAL
  Filled 2017-03-10: qty 2

## 2017-03-10 MED ORDER — ADULT MULTIVITAMIN W/MINERALS CH
1.0000 | ORAL_TABLET | Freq: Every day | ORAL | Status: DC
  Administered 2017-03-11: 1 via ORAL
  Filled 2017-03-10 (×3): qty 1

## 2017-03-10 MED ORDER — ACETAMINOPHEN 325 MG PO TABS: 650.0000 mg | ORAL_TABLET | ORAL | Status: DC | PRN

## 2017-03-10 MED ORDER — DOBUTAMINE IN D5W 4-5 MG/ML-% IV SOLN
INTRAVENOUS | Status: AC
  Filled 2017-03-10: qty 250

## 2017-03-10 MED ORDER — PHENYLEPHRINE 40 MCG/ML (10ML) SYRINGE FOR IV PUSH (FOR BLOOD PRESSURE SUPPORT)
PREFILLED_SYRINGE | INTRAVENOUS | Status: DC | PRN
  Administered 2017-03-10 (×2): 80 ug via INTRAVENOUS

## 2017-03-10 MED ORDER — ACETAMINOPHEN 500 MG PO TABS: 500.0000 mg | ORAL_TABLET | Freq: Four times a day (QID) | ORAL | Status: DC | PRN

## 2017-03-10 MED ORDER — DEXAMETHASONE SODIUM PHOSPHATE 4 MG/ML IJ SOLN
INTRAMUSCULAR | Status: DC | PRN
  Administered 2017-03-10: 8 mg via INTRAVENOUS

## 2017-03-10 MED ORDER — FLECAINIDE ACETATE 100 MG PO TABS
100.0000 mg | ORAL_TABLET | Freq: Two times a day (BID) | ORAL | Status: DC
  Administered 2017-03-10 – 2017-03-11 (×2): 100 mg via ORAL
  Filled 2017-03-10 (×3): qty 1

## 2017-03-10 SURGICAL SUPPLY — 18 items
BLANKET WARM UNDERBOD FULL ACC (MISCELLANEOUS) ×3 IMPLANT
CATH MAPPNG PENTARAY F 2-6-2MM (CATHETERS) ×1 IMPLANT
CATH SMTCH THERMOCOOL SF DF (CATHETERS) ×3 IMPLANT
CATH SOUNDSTAR 3D IMAGING (CATHETERS) ×3 IMPLANT
CATH WEBSTER BI DIR CS D-F CRV (CATHETERS) ×3 IMPLANT
PACK EP LATEX FREE (CUSTOM PROCEDURE TRAY) ×2
PACK EP LF (CUSTOM PROCEDURE TRAY) ×1 IMPLANT
PAD DEFIB LIFELINK (PAD) ×3 IMPLANT
PATCH CARTO3 (PAD) ×3 IMPLANT
PENTARAY F 2-6-2MM (CATHETERS) ×3
SHEATH AVANTI 11F 11CM (SHEATH) ×3 IMPLANT
SHEATH BAYLIS SUREFLEX  M 8.5 (SHEATH) ×4
SHEATH BAYLIS SUREFLEX M 8.5 (SHEATH) ×2 IMPLANT
SHEATH BAYLIS TRANSSEPTAL 98CM (NEEDLE) ×3 IMPLANT
SHEATH PINNACLE 7F 10CM (SHEATH) ×3 IMPLANT
SHEATH PINNACLE 8F 10CM (SHEATH) ×6 IMPLANT
SHEATH PINNACLE 9F 10CM (SHEATH) ×6 IMPLANT
TUBING SMART ABLATE COOLFLOW (TUBING) ×3 IMPLANT

## 2017-03-10 NOTE — Discharge Summary (Signed)
ELECTROPHYSIOLOGY PROCEDURE DISCHARGE SUMMARY    Patient ID: Mary Farrell,  MRN: [id number], DOB/AGE: [birth year] 72 y.o.  Admit date: 03/10/2017 Discharge date: 03/11/17  Primary Care Physician: Lawerance Cruel, MD  Primary Cardiologist: Dr. Oval Linsey Electrophysiologist: Dr. Curt Bears  Primary Discharge Diagnosis:  1.  Persistent Afib      CHA2DS2Vasc is 3, on Eliquis  Secondary Discharge Diagnosis:  1. VHD     Mod AI, mild-mod MS 2. Chronic pericardial effusion 3. HTN   Procedures This Admission:  1.  Electrophysiology study and radiofrequency catheter ablation on 03/10/17 by Dr Curt Bears.   This study demonstrated  CONCLUSIONS: 1. Atrial fibrillation upon presentation.   2. Successful electrical isolation and anatomical encircling of all four pulmonary veins with radiofrequency current.  A WACA approach was used 3. Additional left atrial ablation was performed with a left atrial roof line  4. Atrial fibrillation successfully cardioverted to sinus rhythm. 5. No early apparent complications.  Brief HPI: Mary Farrell is a 72 y.o. female with a history of persistent atrial fibrillation.  They have failed medical therapy with Flecainide. Risks, benefits, and alternatives to catheter ablation of atrial fibrillation were reviewed with the patient who wished to proceed.  The patient underwent cardia CT which demonstrated no LAA thrombus.    Hospital Course:  The patient was admitted and underwent EPS/RFCA of atrial fibrillation with details as outlined above.  They were monitored on telemetry overnight which demonstrated SR.  B/l groins are without complication on the day of discharge.  The patient was examined by Dr. Curt Bears and considered to be stable for discharge.  Wound care and restrictions were reviewed with the patient.  The patient Mary Farrell be seen back by Roderic Palau, NP in 4 weeks and Dr Curt Bears in 12 weeks for post ablation follow up.    Physical Exam: Vitals:   03/10/17 1615 03/10/17 1937 03/11/17 0351 03/11/17 0802  BP: 116/65 (!) 143/74 133/60 (!) 112/54  Pulse: 87 92 82   Resp: 15 14    Temp:  97.8 F (36.6 C) 97.7 F (36.5 C) 97.9 F (36.6 C)  TempSrc:  Oral Oral Oral  SpO2: 98% 97% 97% 98%  Weight:   163 lb 0.5 oz (73.9 kg)   Height:        GEN- The patient is well appearing, alert and oriented x 3 today.   HEENT: normocephalic, atraumatic; sclera clear, conjunctiva pink; hearing intact; oropharynx clear; neck supple  Lungs-  CTA b/l, normal work of breathing.  No wheezes, rales, rhonchi Heart- RRR, no murmurs, rubs or gallops  GI- soft, non-tender, non-distended Extremities- no clubbing, cyanosis, or edema; DP/PT2+ bilaterally, b/l groins are without hematoma/bruit MS- no significant deformity or atrophy Skin- warm and dry, no rash or lesion Psych- euthymic mood, full affect Neuro- strength and sensation are intact   Labs:   Lab Results  Component Value Date   WBC 8.0 02/25/2017   HGB 14.1 02/25/2017   HCT 40.7 02/25/2017   MCV 94 02/25/2017   PLT 206 02/25/2017   No results for input(s): NA, K, CL, CO2, BUN, CREATININE, CALCIUM, PROT, BILITOT, ALKPHOS, ALT, AST, GLUCOSE in the last 168 hours.  Invalid input(s): LABALBU   Discharge Medications:  Allergies as of 03/11/2017      Reactions   Metoprolol Other (See Comments)   Mouth tastes like mold   Oxaprozin Nausea Only      Medication List    STOP taking these medications   ondansetron  4 MG tablet Commonly known as:  ZOFRAN     TAKE these medications   acetaminophen 500 MG tablet Commonly known as:  TYLENOL Take 500 mg by mouth every 6 (six) hours as needed for moderate pain or headache.   ALPRAZolam 0.25 MG tablet Commonly known as:  XANAX Take 0.125 mg by mouth 2 (two) times daily as needed for anxiety.   carvedilol 6.25 MG tablet Commonly known as:  COREG TAKE 1 TABLET BY MOUTH TWO  TIMES DAILY What changed:    how much to take  how to take  this  when to take this   conjugated estrogens vaginal cream Commonly known as:  PREMARIN Place 1 Applicatorful vaginally 3 (three) times a week.   dorzolamide-timolol 22.3-6.8 MG/ML ophthalmic solution Commonly known as:  COSOPT PLACE 1 DROP INTO EACH EYE DAILY   doxepin 25 MG capsule Commonly known as:  SINEQUAN Take 25 mg by mouth at bedtime.   ELIQUIS 5 MG Tabs tablet Generic drug:  apixaban TAKE 1 TABLET BY MOUTH TWO  TIMES DAILY What changed:    how much to take  how to take this  when to take this   flecainide 50 MG tablet Commonly known as:  TAMBOCOR Take 2 tablets (100 mg total) by mouth 2 (two) times daily.   gabapentin 300 MG capsule Commonly known as:  NEURONTIN Take 300 mg by mouth 3 (three) times daily.   hydrochlorothiazide 12.5 MG capsule Commonly known as:  MICROZIDE TAKE 1 CAPSULE BY MOUTH  DAILY What changed:    how much to take  how to take this  when to take this   latanoprost 0.005 % ophthalmic solution Commonly known as:  XALATAN Place 1 drop into both eyes at bedtime.   losartan 100 MG tablet Commonly known as:  COZAAR Take 100 mg by mouth daily.   multivitamin per tablet Take 1 tablet by mouth daily.   NON FORMULARY Apply 1 application topically 2 (two) times daily as needed (for eczema). Traimcinolone/[facility] Moist Cream   omeprazole 40 MG capsule Commonly known as:  PRILOSEC Take 40 mg by mouth daily.   oxybutynin 10 MG 24 hr tablet Commonly known as:  DITROPAN-XL Take 10 mg by mouth daily as needed (for bladder).   STOOL SOFTENER PO Take 1 capsule by mouth 2 (two) times daily.   VITAMIN B COMPLEX PO Take 1 capsule by mouth daily.   Vitamin D 2000 units Caps Take 2,000 Units by mouth daily.       Disposition:  Home Discharge Instructions    Diet - low sodium heart healthy   Complete by:  As directed    Increase activity slowly   Complete by:  As directed      Follow-up Information    [facility] Follow up on 04/07/2017.   Specialty:  Cardiology Why:  2:30PM Contact information: [address] [id number] mc [address] [place] [address] [phone]       [name], MD Follow up on 06/07/2017.   Specialty:  Cardiology Why:  2:30PM Contact information: [place] [address] [phone]           Duration of Discharge Encounter: Greater than 30 minutes including physician time.  Signed, Tommye Standard, PA-C 03/11/2017 10:18 AM   I have seen and examined this patient with Tommye Standard.  Agree with above, note added to reflect my findings.  On exam, RRR, no murmurs, lungs  clear.  To the hospital for atrial fibrillation ablation.  Tolerated procedure well.  Did have a steam pop during the CTI line.  This was not completed.  She does have a chronic pericardial effusion that did not change during the procedure.  Remains in sinus rhythm today.  Plan for discharge on home dose of flecainide.  Malacki Mcphearson M. Kael Keetch MD 03/11/2017 11:29 AM

## 2017-03-10 NOTE — Progress Notes (Signed)
Site area: left groin fv sheaths x2 Site Prior to Removal:  Level 0 Pressure Applied For:  15 minutes Manual:   yes Patient Status During Pull:  stable Post Pull Site:  Level  0 Post Pull Instructions Given:  yes Post Pull Pulses Present: palpable Dressing Applied:  Gauze and tegaderm Bedrest begins @ 1330 Comments:  IV locked

## 2017-03-10 NOTE — Anesthesia Preprocedure Evaluation (Signed)
Anesthesia Evaluation  Patient identified by MRN, date of birth, ID band Patient awake    Reviewed: Allergy & Precautions, NPO status , Patient's Chart, lab work & pertinent test results  Airway Mallampati: II  TM Distance: <3 FB     Dental  (+) Teeth Intact   Pulmonary shortness of breath,    breath sounds clear to auscultation       Cardiovascular hypertension, + dysrhythmias Atrial Fibrillation + Valvular Problems/Murmurs  Rhythm:Irregular Rate:Normal     Neuro/Psych    GI/Hepatic Neg liver ROS, hiatal hernia, GERD  ,  Endo/Other  negative endocrine ROS  Renal/GU negative Renal ROS     Musculoskeletal   Abdominal   Peds  Hematology   Anesthesia Other Findings   Reproductive/Obstetrics                             Anesthesia Physical Anesthesia Plan  ASA: III  Anesthesia Plan: MAC   Post-op Pain Management:    Induction: Intravenous  PONV Risk Score and Plan: 2  Airway Management Planned: Natural Airway and Simple Face Mask  Additional Equipment:   Intra-op Plan:   Post-operative Plan:   Informed Consent: I have reviewed the patients History and Physical, chart, labs and discussed the procedure including the risks, benefits and alternatives for the proposed anesthesia with the patient or authorized representative who has indicated his/her understanding and acceptance.     Plan Discussed with: CRNA  Anesthesia Plan Comments:         Anesthesia Quick Evaluation

## 2017-03-10 NOTE — Plan of Care (Signed)
VSS. Cardiac Monitor shows SR s/p Afib ablation. Medicine Regimen discussed with pt and husband. Bilateral Groin sites CD & I.

## 2017-03-10 NOTE — H&P (Signed)
Mary Farrell has presented today for surgery, with the diagnosis of atrial fibrillation.  The various methods of treatment have been discussed with the patient and family. After consideration of risks, benefits and other options for treatment, the patient has consented to  Procedure(s): Catheter ablation as a surgical intervention .  Risks include but not limited to bleeding, tamponade, heart block, stroke, damage to surrounding organs, among others. The patient's history has been reviewed, patient examined, no change in status, stable for surgery.  I have reviewed the patient's chart and labs.  Questions were answered to the patient's satisfaction.    Brinae Woods Curt Bears, MD 03/10/2017 7:35 AM

## 2017-03-10 NOTE — Discharge Instructions (Addendum)
Post procedure care instructions No driving for 4 days. No lifting over 5 lbs for 1 week. No vigorous or sexual activity for 1 week. You may return to work in one week. Keep procedure site clean & dry. If you notice increased pain, swelling, bleeding or pus, call/return!  You may shower, but no soaking baths/hot tubs/pools for 1 week.    You have an appointment set up with the [facility].  Multiple studies have shown that being followed by a dedicated atrial fibrillation clinic in addition to the standard care you receive from your other physicians improves health. We believe that enrollment in the atrial fibrillation clinic will allow [place] to better care for you.   The phone number to the [facility] is [phone]. The clinic is staffed Monday through Friday from 8:30am to 5pm.  Parking Directions: The clinic is located in the Heart and Vascular Building connected to [facility]. 1)From [address] turn on to [address] and go to the 3rd entrance  (Heart and Vascular entrance) on the right. 2)Look to the right for Heart &Vascular Parking Garage. 3)A code for the entrance is required please call the clinic to receive this.   4)Take the elevators to the 1st floor. Registration is in the room with the glass walls at the end of the hallway.  If you have any trouble parking or locating the clinic, please dont hesitate to call [phone].   Information on my medicine - ELIQUIS (apixaban)  This medication education was reviewed with me or my healthcare representative as part of my discharge preparation.  The pharmacist that spoke with me during my hospital stay was:  Wayland Salinas, [facility]  Why was Eliquis prescribed for you? Eliquis was prescribed for you to reduce the risk of a blood clot forming that can cause a stroke if you have a medical condition called atrial fibrillation (a type of irregular heartbeat).  What do You need to  know about Eliquis ? Take your Eliquis TWICE DAILY - one tablet in the morning and one tablet in the evening with or without food. If you have difficulty swallowing the tablet whole please discuss with your pharmacist how to take the medication safely.  Take Eliquis exactly as prescribed by your doctor and DO NOT stop taking Eliquis without talking to the doctor who prescribed the medication.  Stopping may increase your risk of developing a stroke.  Refill your prescription before you run out.  After discharge, you should have regular check-up appointments with your healthcare provider that is prescribing your Eliquis.  In the future your dose may need to be changed if your kidney function or weight changes by a significant amount or as you get older.  What do you do if you miss a dose? If you miss a dose, take it as soon as you remember on the same day and resume taking twice daily.  Do not take more than one dose of ELIQUIS at the same time to make up a missed dose.  Important Safety Information A possible side effect of Eliquis is bleeding. You should call your healthcare provider right away if you experience any of the following: ? Bleeding from an injury or your nose that does not stop. ? Unusual colored urine (red or dark brown) or unusual colored stools (red or black). ? Unusual bruising for unknown reasons. ? A serious fall or if you hit your head (even if there is no bleeding).  Some medicines may  interact with Eliquis and might increase your risk of bleeding or clotting while on Eliquis. To help avoid this, consult your healthcare provider or pharmacist prior to using any new prescription or non-prescription medications, including herbals, vitamins, non-steroidal anti-inflammatory drugs (NSAIDs) and supplements.  This website has more information on Eliquis (apixaban): http://www.eliquis.com/eliquis/home

## 2017-03-10 NOTE — Anesthesia Procedure Notes (Addendum)
Procedure Name: Intubation Date/Time: 03/10/2017 7:59 AM Performed by: Orlie Dakin, CRNA Pre-anesthesia Checklist: Patient identified, Emergency Drugs available, Suction available, Patient being monitored and Timeout performed Patient Re-evaluated:Patient Re-evaluated prior to induction Oxygen Delivery Method: Circle system utilized Preoxygenation: Pre-oxygenation with 100% oxygen Induction Type: IV induction Ventilation: Oral airway inserted - appropriate to patient size and Mask ventilation without difficulty Laryngoscope Size: Mac and 3 Grade View: Grade I Tube type: Oral Tube size: 7.0 mm Number of attempts: 1 Airway Equipment and Method: Stylet Placement Confirmation: ETT inserted through vocal cords under direct vision,  positive ETCO2 and breath sounds checked- equal and bilateral Secured at: 21 cm Tube secured with: Tape Dental Injury: Teeth and Oropharynx as per pre-operative assessment  Comments: Intubated by Abran Richard

## 2017-03-10 NOTE — Transfer of Care (Signed)
Immediate Anesthesia Transfer of Care Note  Patient: Mary Farrell  Procedure(s) Performed: ATRIAL FIBRILLATION ABLATION (N/A )  Patient Location: Cath Lab  Anesthesia Type:General  Level of Consciousness: awake, oriented and patient cooperative  Airway & Oxygen Therapy: Patient Spontanous Breathing and Patient connected to nasal cannula oxygen  Post-op Assessment: Report given to RN and Post -op Vital signs reviewed and stable  Post vital signs: Reviewed and stable  Last Vitals:  Vitals:   03/10/17 0559  BP: 128/74  Pulse: 96  Temp: (!) 36.3 C  SpO2: 97%    Last Pain:  Vitals:   03/10/17 0559  TempSrc: Oral         Complications: No apparent anesthesia complications

## 2017-03-10 NOTE — CV Procedure (Signed)
Site area: Right Femoral Vein Site Prior to Removal:  level 0 Pressure Applied For: 20 mins Manual: yes Patient Status During Pull:  stable Post Pull Site:  level 0 Post Pull Instructions Given:  yes  Post Pull Pulses Present: right DP 2+ Dressing Applied: gauze and tegaderm Bedrest begins @ after left femoral sheaths pulled Comments: ACT 142 @1222 

## 2017-03-11 DIAGNOSIS — I483 Typical atrial flutter: Secondary | ICD-10-CM | POA: Diagnosis not present

## 2017-03-11 DIAGNOSIS — I481 Persistent atrial fibrillation: Secondary | ICD-10-CM | POA: Diagnosis not present

## 2017-03-11 DIAGNOSIS — Z7901 Long term (current) use of anticoagulants: Secondary | ICD-10-CM | POA: Diagnosis not present

## 2017-03-11 DIAGNOSIS — I10 Essential (primary) hypertension: Secondary | ICD-10-CM | POA: Diagnosis not present

## 2017-03-11 MED ORDER — FLECAINIDE ACETATE 50 MG PO TABS: 100.0000 mg | ORAL_TABLET | Freq: Two times a day (BID) | ORAL | 3 refills | Status: DC

## 2017-03-11 NOTE — Anesthesia Postprocedure Evaluation (Signed)
Anesthesia Post Note  Patient: Mary Farrell  Procedure(s) Performed: ATRIAL FIBRILLATION ABLATION (N/A )     Patient location during evaluation: Cath Lab Anesthesia Type: General Level of consciousness: awake and alert Pain management: pain level controlled Vital Signs Assessment: post-procedure vital signs reviewed and stable Respiratory status: spontaneous breathing, nonlabored ventilation, respiratory function stable and patient connected to nasal cannula oxygen Cardiovascular status: blood pressure returned to baseline and stable Postop Assessment: no apparent nausea or vomiting Anesthetic complications: no    Last Vitals:  Vitals:   03/11/17 0351 03/11/17 0802  BP: 133/60 (!) 112/54  Pulse: 82   Resp:    Temp: 36.5 C 36.6 C  SpO2: 97% 98%    Last Pain:  Vitals:   03/11/17 0802  TempSrc: Oral                 Leonilda Cozby,JAMES TERRILL

## 2017-03-11 NOTE — Progress Notes (Signed)
Discharge instruction was given to pt. Belongings were sent home with pt and family.  Furqan Gosselin, RN 

## 2017-03-15 LAB — POCT ACTIVATED CLOTTING TIME
ACTIVATED CLOTTING TIME: 378 s
ACTIVATED CLOTTING TIME: 340 s
Activated Clotting Time: 307 seconds

## 2017-03-17 ENCOUNTER — Telehealth (HOSPITAL_COMMUNITY): Payer: Self-pay | Admitting: *Deleted

## 2017-03-17 NOTE — Telephone Encounter (Signed)
Patient called in stating she has just not been feeling "herself" since the ablation last week - she is weak, more short of breath and her heart rate is up more than normal. She understands afib is normal after ablation during healing phase. Weight is up 4lbs and she is having to sleep on a wedge due to shortness of breath with lying flat. She has PRN HCTZ -- instructed patient to take this today and repeat again tomorrow if weight is not back to baseline. If she is still in afib on Monday she will call back for appointment to be seen. Her SBP is right around 100 so she would prefer not taking any extra coreg at this time -- her HR is between 91-120. Pt would like to try getting fluid off first and see if this improves her symptoms.

## 2017-03-18 ENCOUNTER — Telehealth (HOSPITAL_COMMUNITY): Payer: Self-pay | Admitting: *Deleted

## 2017-03-18 NOTE — Telephone Encounter (Signed)
Pt cld regarding the swelling she had talked to Rushie Goltz, RN about yesterday.   She took 25mg  of HCTZ due to her not having lost much of the fluid she had put on.  She was originally taking 12.5.  She stated in the past, Dr. Mare Ferrari had her on 25 mg.  Kidney function normal, HR 90 but BP was low at 92/51.  Per Roderic Palau, go back to 12.5 due to low bp, come in for appt Monday and if swelling or SOB get worse over the weekend, go to the ED.  Pt understood and scheduled for Monday

## 2017-03-21 ENCOUNTER — Ambulatory Visit (HOSPITAL_COMMUNITY)
Admission: RE | Admit: 2017-03-21 | Discharge: 2017-03-21 | Disposition: A | Discharge status: Home / Self Care | Payer: Medicare Other | Source: Ambulatory Visit | Attending: Nurse Practitioner | Admitting: Nurse Practitioner

## 2017-03-21 ENCOUNTER — Other Ambulatory Visit: Payer: Self-pay

## 2017-03-21 ENCOUNTER — Encounter (HOSPITAL_COMMUNITY): Payer: Self-pay | Admitting: Nurse Practitioner

## 2017-03-21 VITALS — BP 114/74 | HR 89 | Ht 60.0 in | Wt 166.0 lb

## 2017-03-21 DIAGNOSIS — I480 Paroxysmal atrial fibrillation: Secondary | ICD-10-CM | POA: Diagnosis not present

## 2017-03-21 DIAGNOSIS — Z841 Family history of disorders of kidney and ureter: Secondary | ICD-10-CM | POA: Insufficient documentation

## 2017-03-21 DIAGNOSIS — M1990 Unspecified osteoarthritis, unspecified site: Secondary | ICD-10-CM | POA: Insufficient documentation

## 2017-03-21 DIAGNOSIS — Z833 Family history of diabetes mellitus: Secondary | ICD-10-CM | POA: Diagnosis not present

## 2017-03-21 DIAGNOSIS — I4891 Unspecified atrial fibrillation: Secondary | ICD-10-CM | POA: Diagnosis present

## 2017-03-21 DIAGNOSIS — Z961 Presence of intraocular lens: Secondary | ICD-10-CM | POA: Insufficient documentation

## 2017-03-21 DIAGNOSIS — I10 Essential (primary) hypertension: Secondary | ICD-10-CM | POA: Diagnosis not present

## 2017-03-21 DIAGNOSIS — Z7901 Long term (current) use of anticoagulants: Secondary | ICD-10-CM | POA: Diagnosis not present

## 2017-03-21 DIAGNOSIS — Z90710 Acquired absence of both cervix and uterus: Secondary | ICD-10-CM | POA: Diagnosis not present

## 2017-03-21 DIAGNOSIS — Z9049 Acquired absence of other specified parts of digestive tract: Secondary | ICD-10-CM | POA: Insufficient documentation

## 2017-03-21 DIAGNOSIS — Z8249 Family history of ischemic heart disease and other diseases of the circulatory system: Secondary | ICD-10-CM | POA: Insufficient documentation

## 2017-03-21 DIAGNOSIS — K219 Gastro-esophageal reflux disease without esophagitis: Secondary | ICD-10-CM | POA: Insufficient documentation

## 2017-03-21 DIAGNOSIS — Z9842 Cataract extraction status, left eye: Secondary | ICD-10-CM | POA: Insufficient documentation

## 2017-03-21 DIAGNOSIS — H409 Unspecified glaucoma: Secondary | ICD-10-CM | POA: Diagnosis not present

## 2017-03-21 DIAGNOSIS — Z79899 Other long term (current) drug therapy: Secondary | ICD-10-CM | POA: Diagnosis not present

## 2017-03-21 DIAGNOSIS — Z888 Allergy status to other drugs, medicaments and biological substances status: Secondary | ICD-10-CM | POA: Diagnosis not present

## 2017-03-21 DIAGNOSIS — Z9841 Cataract extraction status, right eye: Secondary | ICD-10-CM | POA: Insufficient documentation

## 2017-03-21 LAB — BASIC METABOLIC PANEL
ANION GAP: 9 (ref 5–15)
BUN: 8 mg/dL (ref 6–20)
CHLORIDE: 99 mmol/L — AB (ref 101–111)
CO2: 27 mmol/L (ref 22–32)
Calcium: 9.1 mg/dL (ref 8.9–10.3)
Creatinine, Ser: 0.76 mg/dL (ref 0.44–1.00)
GFR calc non Af Amer: >60 mL/min (ref >60)
GLUCOSE: 92 mg/dL (ref 65–99)
Potassium: 4 mmol/L (ref 3.5–5.1)
Sodium: 135 mmol/L (ref 135–145)

## 2017-03-21 LAB — MAGNESIUM: Magnesium: 1.9 mg/dL (ref 1.7–2.4)

## 2017-03-21 NOTE — Progress Notes (Signed)
Primary Care Physician: Lawerance Cruel, MD Referring Physician: Dr. Myles Lipps Mary Farrell is a 72 y.o. female with a h/o moderate aortic regurgitation, mild to moderate mitral stenosis, chronic pericardial effusion, paroxysmal atrial flutter, and hypertension. She had a heart catheterization in 1996 revealed normal coronaries with mild MS and mild pulmonary hypertension. Exercise Myoview 09/14/2015 revealed any evidence 60% and no perfusion defects. She was admitted 09/14/2015 with atrial flutter. She was started on amiodarone but did not tolerate it due to nausea. She  was started on flecainide. She was seen on 10/21/16  due to atrial fibrillation. She had a cardioversion on 10/1. 2 hours later, she noted palpitations with a heart rate of 104. She has had one episode of atrial fibrillation since her diagnosis 13 months ago.  She continued to have arhythmia's despite AAD and eventually had an ablation 03/10/17. She called the office a few days after and described shortness of breath, some irregular rhythm and gain in weight by around 3 lbs. She was advised to take HCTZ which she had on hand, 1/2 tab x 3 days and would be seen back in the clinic. Today, she feels her weight is back to her usual baseline. Her shortness of breath is improved but not quite back to baseline. She is in SR today but has noticed some brief  irregularity at bedtime  Today, she denies symptoms of  chest pain,  orthopnea, PND, lower extremity edema, dizziness, presyncope, syncope, or neurologic sequela. The patient is tolerating medications without difficulties and is otherwise without complaint today.   Past Medical History:  Diagnosis Date  . Aortic regurgitation 08/13/2015  . Arthritis    "hands, wrists, back, feet, toes" (09/18/2015)  . Atypical chest pain 05/13/2016  . Chest pain    remote cath in 1996 with NORMAL coronaries noted  . Exogenous obesity    history of   . GERD (gastroesophageal reflux disease)   .  Glaucoma, both eyes   . Headache, migraine    "stopped in my 50's" (09/18/2015)  . History of cardiovascular stress test 2007   showing no ischemia  . Hypertension   . Mitral stenosis with insufficiency   . Murmur    of mitral stenosis and moderate aortic insufficiency      . Palpitations    occasional  . Personal history of rheumatic heart disease   . SBE (subacute bacterial endocarditis)    prophylaxis  . Sliding hiatal hernia    Past Surgical History:  Procedure Laterality Date  . ABDOMINAL HYSTERECTOMY  1975  . APPENDECTOMY  1977  . ATRIAL FIBRILLATION ABLATION N/A 03/10/2017   Procedure: ATRIAL FIBRILLATION ABLATION;  Surgeon: [name], MD;  Location: [facility];  Service: Cardiovascular;  Laterality: N/A;  . BUNIONECTOMY Right 2000s  . CARDIAC CATHETERIZATION  01/13/1995   normal coronary anatomy and mild mitral stenosis and mild pulmonary hypertension  . CARDIOVERSION N/A 09/22/2015   Procedure: CARDIOVERSION;  Surgeon: Jerline Pain, MD;  Location: [place];  Service: Cardiovascular;  Laterality: N/A;  . CARDIOVERSION N/A 10/25/2016   Procedure: CARDIOVERSION;  Surgeon: Fay Records, MD;  Location: [facility];  Service: Cardiovascular;  Laterality: N/A;  . CATARACT EXTRACTION W/ INTRAOCULAR LENS  IMPLANT, BILATERAL Bilateral 2013  . LAPAROSCOPIC CHOLECYSTECTOMY  1997  . TONSILLECTOMY AND ADENOIDECTOMY  1990s  . UVULOPALATOPHARYNGOPLASTY, TONSILLECTOMY AND SEPTOPLASTY  1990s    Current Outpatient Medications  Medication Sig Dispense Refill  . acetaminophen (TYLENOL) 500 MG  tablet Take 500 mg by mouth every 6 (six) hours as needed for moderate pain or headache.    . ALPRAZolam (XANAX) 0.25 MG tablet Take 0.125 mg by mouth 2 (two) times daily as needed for anxiety.     . B Complex Vitamins (VITAMIN B COMPLEX PO) Take 1 capsule by mouth daily.      . carvedilol (COREG) 6.25 MG tablet TAKE 1 TABLET BY MOUTH TWO  TIMES DAILY (Patient taking  differently: Take 6.25 mg by mouth twice daily) 180 tablet 2  . Cholecalciferol (VITAMIN D) 2000 UNITS CAPS Take 2,000 Units by mouth daily.      Marland Kitchen conjugated estrogens (PREMARIN) vaginal cream Place 1 Applicatorful vaginally 3 (three) times a week.     Mariane Baumgarten Calcium (STOOL SOFTENER PO) Take 1 capsule by mouth 2 (two) times daily.     . dorzolamide-timolol (COSOPT) 22.3-6.8 MG/ML ophthalmic solution PLACE 1 DROP INTO EACH EYE DAILY  6  . doxepin (SINEQUAN) 25 MG capsule Take 25 mg by mouth at bedtime.  8  . ELIQUIS 5 MG TABS tablet TAKE 1 TABLET BY MOUTH TWO  TIMES DAILY (Patient taking differently: TAKE 5 MG BY MOUTH TWO  TIMES DAILY) 180 tablet 1  . flecainide (TAMBOCOR) 50 MG tablet Take 2 tablets (100 mg total) by mouth 2 (two) times daily. 120 tablet 3  . gabapentin (NEURONTIN) 300 MG capsule Take 300 mg by mouth 3 (three) times daily.    . hydrochlorothiazide (MICROZIDE) 12.5 MG capsule TAKE 1 CAPSULE BY MOUTH  DAILY (Patient taking differently: Take 12.5 mg by mouth once daily as needed for BP) 90 capsule 3  . latanoprost (XALATAN) 0.005 % ophthalmic solution Place 1 drop into both eyes at bedtime.   6  . losartan (COZAAR) 100 MG tablet Take 100 mg by mouth daily.    . multivitamin (THERAGRAN) per tablet Take 1 tablet by mouth daily.      . NON FORMULARY Apply 1 application topically 2 (two) times daily as needed (for eczema). Traimcinolone/[facility] Moist Cream    . omeprazole (PRILOSEC) 40 MG capsule Take 40 mg by mouth daily.     Marland Kitchen oxybutynin (DITROPAN-XL) 10 MG 24 hr tablet Take 10 mg by mouth daily as needed (for bladder).      No current facility-administered medications for this encounter.     Allergies  Allergen Reactions  . Metoprolol Other (See Comments)    Mouth tastes like mold  . Oxaprozin Nausea Only    Social History   Socioeconomic History  . Marital status: Married    Spouse name: Not on file  . Number of children: Not on file  . Years of education: Not on file   . Highest education level: Not on file  Social Needs  . Financial resource strain: Not on file  . Food insecurity - worry: Not on file  . Food insecurity - inability: Not on file  . Transportation needs - medical: Not on file  . Transportation needs - non-medical: Not on file  Occupational History  . Occupation: Retired  Tobacco Use  . Smoking status: Never Smoker  . Smokeless tobacco: Never Used  Substance and Sexual Activity  . Alcohol use: No  . Drug use: No  . Sexual activity: Not Currently  Other Topics Concern  . Not on file  Social History Narrative   Lives with husband in [place].    Family History  Problem Relation Age of Onset  . Heart attack Mother   .  Hypertension Brother   . Kidney disease Brother   . Diabetes Brother   . Heart failure Maternal Grandfather     ROS- All systems are reviewed and negative except as per the HPI above  Physical Exam: Vitals:   03/21/17 1402  BP: 114/74  Pulse: 89  Weight: 166 lb (75.3 kg)  Height: 5' (1.524 m)   Wt Readings from Last 3 Encounters:  03/21/17 166 lb (75.3 kg)  03/11/17 163 lb 0.5 oz (73.9 kg)  02/24/17 164 lb (74.4 kg)    Labs: Lab Results  Component Value Date   NA 138 02/25/2017   K 4.3 02/25/2017   CL 99 02/25/2017   CO2 23 02/25/2017   GLUCOSE 99 02/25/2017   BUN 12 02/25/2017   CREATININE 0.81 02/25/2017   CALCIUM 9.4 02/25/2017   MG 2.3 09/20/2015   Lab Results  Component Value Date   INR 1.04 09/18/2015   No results found for: CHOL, HDL, LDLCALC, TRIG   GEN- The patient is well appearing, alert and oriented x 3 today.   Head- normocephalic, atraumatic Eyes-  Sclera clear, conjunctiva pink Ears- hearing intact Oropharynx- clear Neck- supple, no JVP Lymph- no cervical lymphadenopathy Lungs- Clear to ausculation bilaterally, normal work of breathing Heart- Regular rate and rhythm, + for S3  gallop,has been documented by Dr. Oval Linsey in the past. PMI not laterally displaced GI-  soft, NT, ND, + BS Extremities- no clubbing, cyanosis, or edema MS- no significant deformity or atrophy Skin- no rash or lesion Psych- euthymic mood, full affect Neuro- strength and sensation are intact  EKG-NSR with IRBBB, LAFB, v rate at 89 bpm, pr int 160 ms, qrs int 92 ms, qtc 467 ms Epic records reviewed    Assessment and Plan: 1. Paroxysmal afib  S/p ablation In SR today Initial shortness of breath improved but not back to baseline, not uncommon after ablation Initially weight  was up 3 lbs, but after extra hctz, she feels it is back to baseline She feels reassured Continue other meds without change Continue eliquis 5 mg bid for chadsvasc score of 3 Bmet/mag today  I will see her back in 3 weeks but sooner if any more issues  Butch Penny C. Daena Alper, [facility] [address] [place], [place] [address] [phone]

## 2017-04-07 ENCOUNTER — Ambulatory Visit (HOSPITAL_COMMUNITY)
Admission: RE | Admit: 2017-04-07 | Discharge: 2017-04-07 | Disposition: A | Discharge status: Home / Self Care | Payer: Medicare Other | Source: Ambulatory Visit | Attending: Nurse Practitioner | Admitting: Nurse Practitioner

## 2017-04-07 ENCOUNTER — Encounter (HOSPITAL_COMMUNITY): Payer: Self-pay | Admitting: Nurse Practitioner

## 2017-04-07 VITALS — BP 114/64 | HR 88 | Ht 60.0 in | Wt 163.2 lb

## 2017-04-07 DIAGNOSIS — K449 Diaphragmatic hernia without obstruction or gangrene: Secondary | ICD-10-CM | POA: Insufficient documentation

## 2017-04-07 DIAGNOSIS — I351 Nonrheumatic aortic (valve) insufficiency: Secondary | ICD-10-CM | POA: Diagnosis not present

## 2017-04-07 DIAGNOSIS — H409 Unspecified glaucoma: Secondary | ICD-10-CM | POA: Diagnosis not present

## 2017-04-07 DIAGNOSIS — I480 Paroxysmal atrial fibrillation: Secondary | ICD-10-CM | POA: Insufficient documentation

## 2017-04-07 DIAGNOSIS — I272 Pulmonary hypertension, unspecified: Secondary | ICD-10-CM | POA: Insufficient documentation

## 2017-04-07 DIAGNOSIS — Z7901 Long term (current) use of anticoagulants: Secondary | ICD-10-CM | POA: Insufficient documentation

## 2017-04-07 DIAGNOSIS — I10 Essential (primary) hypertension: Secondary | ICD-10-CM | POA: Insufficient documentation

## 2017-04-07 DIAGNOSIS — Z79899 Other long term (current) drug therapy: Secondary | ICD-10-CM | POA: Insufficient documentation

## 2017-04-07 DIAGNOSIS — K219 Gastro-esophageal reflux disease without esophagitis: Secondary | ICD-10-CM | POA: Insufficient documentation

## 2017-04-07 NOTE — Progress Notes (Signed)
Primary Care Physician: Lawerance Cruel, MD Referring Physician: Dr. Myles Lipps Mary Farrell is a 72 y.o. female with a h/o moderate aortic regurgitation, mild to moderate mitral stenosis, chronic pericardial effusion, paroxysmal atrial flutter, and hypertension. She had a heart catheterization in 1996 revealed normal coronaries with mild MS and mild pulmonary hypertension. Exercise Myoview 09/14/2015 revealed any evidence 60% and no perfusion defects. She was admitted 09/14/2015 with atrial flutter. She was started on amiodarone but did not tolerate it due to nausea. She  was started on flecainide. She was seen on 10/21/16  due to atrial fibrillation. She had a cardioversion on 10/1. 2 hours later, she noted palpitations with a heart rate of 104. She has had one episode of atrial fibrillation since her diagnosis 13 months ago.  She continued to have arhythmia's despite AAD and eventually had an ablation 03/10/17. She called the office a few days after and described shortness of breath, some irregular rhythm and gain in weight by around 3 lbs. She was advised to take HCTZ which she had on hand, 1/2 tab x 3 days and would be seen back in the clinic. Today, she feels her weight is back to her usual baseline. Her shortness of breath is improved but not quite back to baseline. She is in SR today but has noticed some brief  irregularity at bedtime.  F/u in afib clinic, she feels improved from ablation 2/14. She took lasix for 3 days and her weight is down 3 lbs. But unfortunately, she is in afib today but unaware. According to her V/S chart from home her HR increased yesterday to around 100. She is 88 bpm in the office today.   Today, she denies symptoms of  chest pain,  orthopnea, PND, lower extremity edema, dizziness, presyncope, syncope, or neurologic sequela. The patient is tolerating medications without difficulties and is otherwise without complaint today.   Past Medical History:  Diagnosis Date    . Aortic regurgitation 08/13/2015  . Arthritis    "hands, wrists, back, feet, toes" (09/18/2015)  . Atypical chest pain 05/13/2016  . Chest pain    remote cath in 1996 with NORMAL coronaries noted  . Exogenous obesity    history of   . GERD (gastroesophageal reflux disease)   . Glaucoma, both eyes   . Headache, migraine    "stopped in my 50's" (09/18/2015)  . History of cardiovascular stress test 2007   showing no ischemia  . Hypertension   . Mitral stenosis with insufficiency   . Murmur    of mitral stenosis and moderate aortic insufficiency      . Palpitations    occasional  . Personal history of rheumatic heart disease   . SBE (subacute bacterial endocarditis)    prophylaxis  . Sliding hiatal hernia    Past Surgical History:  Procedure Laterality Date  . ABDOMINAL HYSTERECTOMY  1975  . APPENDECTOMY  1977  . ATRIAL FIBRILLATION ABLATION N/A 03/10/2017   Procedure: ATRIAL FIBRILLATION ABLATION;  Surgeon: [name], MD;  Location: [facility] [facility];  Service: Cardiovascular;  Laterality: N/A;  . BUNIONECTOMY Right 2000s  . CARDIAC CATHETERIZATION  01/13/1995   normal coronary anatomy and mild mitral stenosis and mild pulmonary hypertension  . CARDIOVERSION N/A 09/22/2015   Procedure: CARDIOVERSION;  Surgeon: Jerline Pain, MD;  Location[facility];  Service: Cardiovascular;  Laterality: N/A;  . CARDIOVERSION N/A 10/25/2016   Procedure: CARDIOVERSION;  Surgeon: Fay Records, MD;  Location: [facility]  ENDOSCOPY;  Service: Cardiovascular;  Laterality: N/A;  . CATARACT EXTRACTION W/ INTRAOCULAR LENS  IMPLANT, BILATERAL Bilateral 2013  . LAPAROSCOPIC CHOLECYSTECTOMY  1997  . TONSILLECTOMY AND ADENOIDECTOMY  1990s  . UVULOPALATOPHARYNGOPLASTY, TONSILLECTOMY AND SEPTOPLASTY  1990s    Current Outpatient Medications  Medication Sig Dispense Refill  . acetaminophen (TYLENOL) 500 MG tablet Take 500 mg by mouth every 6 (six) hours as needed for moderate pain or headache.    .  ALPRAZolam (XANAX) 0.25 MG tablet Take 0.125 mg by mouth 2 (two) times daily as needed for anxiety.     . B Complex Vitamins (VITAMIN B COMPLEX PO) Take 1 capsule by mouth daily.      . carvedilol (COREG) 6.25 MG tablet TAKE 1 TABLET BY MOUTH TWO  TIMES DAILY (Patient taking differently: Take 6.25 mg by mouth twice daily) 180 tablet 2  . Cholecalciferol (VITAMIN D) 2000 UNITS CAPS Take 2,000 Units by mouth daily.      Marland Kitchen conjugated estrogens (PREMARIN) vaginal cream Place 1 Applicatorful vaginally 3 (three) times a week.     Mariane Baumgarten Calcium (STOOL SOFTENER PO) Take 1 capsule by mouth 2 (two) times daily.     . dorzolamide-timolol (COSOPT) 22.3-6.8 MG/ML ophthalmic solution PLACE 1 DROP INTO EACH EYE DAILY  6  . doxepin (SINEQUAN) 25 MG capsule Take 25 mg by mouth at bedtime.  8  . ELIQUIS 5 MG TABS tablet TAKE 1 TABLET BY MOUTH TWO  TIMES DAILY (Patient taking differently: TAKE 5 MG BY MOUTH TWO  TIMES DAILY) 180 tablet 1  . flecainide (TAMBOCOR) 50 MG tablet Take 2 tablets (100 mg total) by mouth 2 (two) times daily. 120 tablet 3  . gabapentin (NEURONTIN) 300 MG capsule Take 300 mg by mouth 3 (three) times daily.    . hydrochlorothiazide (MICROZIDE) 12.5 MG capsule TAKE 1 CAPSULE BY MOUTH  DAILY (Patient taking differently: Take 12.5 mg by mouth once daily as needed for BP) 90 capsule 3  . latanoprost (XALATAN) 0.005 % ophthalmic solution Place 1 drop into both eyes at bedtime.   6  . losartan (COZAAR) 100 MG tablet Take 100 mg by mouth daily.    . multivitamin (THERAGRAN) per tablet Take 1 tablet by mouth daily.      . NON FORMULARY Apply 1 application topically 2 (two) times daily as needed (for eczema). Traimcinolone/[facility] Moist Cream    . omeprazole (PRILOSEC) 40 MG capsule Take 40 mg by mouth daily.     Marland Kitchen oxybutynin (DITROPAN-XL) 10 MG 24 hr tablet Take 10 mg by mouth daily as needed (for bladder).      No current facility-administered medications for this encounter.     Allergies    Allergen Reactions  . Metoprolol Other (See Comments)    Mouth tastes like mold  . Oxaprozin Nausea Only    Social History   Socioeconomic History  . Marital status: Married    Spouse name: Not on file  . Number of children: Not on file  . Years of education: Not on file  . Highest education level: Not on file  Social Needs  . Financial resource strain: Not on file  . Food insecurity - worry: Not on file  . Food insecurity - inability: Not on file  . Transportation needs - medical: Not on file  . Transportation needs - non-medical: Not on file  Occupational History  . Occupation: Retired  Tobacco Use  . Smoking status: Never Smoker  . Smokeless tobacco: Never Used  Substance and Sexual Activity  . Alcohol use: No  . Drug use: No  . Sexual activity: Not Currently  Other Topics Concern  . Not on file  Social History Narrative   Lives with husband in [place].    Family History  Problem Relation Age of Onset  . Heart attack Mother   . Hypertension Brother   . Kidney disease Brother   . Diabetes Brother   . Heart failure Maternal Grandfather     ROS- All systems are reviewed and negative except as per the HPI above  Physical Exam: Vitals:   04/07/17 1423  BP: 114/64  Pulse: 88  Weight: 163 lb 3.2 oz (74 kg)  Height: 5' (1.524 m)   Wt Readings from Last 3 Encounters:  04/07/17 163 lb 3.2 oz (74 kg)  03/21/17 166 lb (75.3 kg)  03/11/17 163 lb 0.5 oz (73.9 kg)    Labs: Lab Results  Component Value Date   NA 135 03/21/2017   K 4.0 03/21/2017   CL 99 (L) 03/21/2017   CO2 27 03/21/2017   GLUCOSE 92 03/21/2017   BUN 8 03/21/2017   CREATININE 0.76 03/21/2017   CALCIUM 9.1 03/21/2017   MG 1.9 03/21/2017   Lab Results  Component Value Date   INR 1.04 09/18/2015   No results found for: CHOL, HDL, LDLCALC, TRIG   GEN- The patient is well appearing, alert and oriented x 3 today.   Head- normocephalic, atraumatic Eyes-  Sclera clear, conjunctiva  pink Ears- hearing intact Oropharynx- clear Neck- supple, no JVP Lymph- no cervical lymphadenopathy Lungs- Clear to ausculation bilaterally, normal work of breathing Heart- irregular rate and rhythm, + for S3  gallop,has been documented by Dr. Oval Linsey in the past. PMI not laterally displaced GI- soft, NT, ND, + BS Extremities- no clubbing, cyanosis, or edema MS- no significant deformity or atrophy Skin- no rash or lesion Psych- euthymic mood, full affect Neuro- strength and sensation are intact  EKG-afib at 88 bpm, qrs int 112 ms, qtc 464 ms Epic records reviewed    Assessment and Plan: 1. Paroxysmal afib  S/p ablation Initial shortness of breath improved, weight is back to baseline after short course of lasix Unfortunately back in rate controlled afib at 88 bpm, unaware Continue flecainde and carvedilol Continue eliquis 5 mg bid for chadsvasc score of 3   I will see her back on Monday and if persistently in afib , will plan cardioversion  Butch Penny C. Harm Jou, [facility] [address] [place], [place] [address] [phone]

## 2017-04-07 NOTE — H&P (View-Only) (Signed)
Primary Care Physician: Lawerance Cruel, MD Referring Physician: Dr. Myles Lipps Mary Farrell is a 72 y.o. female with a h/o moderate aortic regurgitation, mild to moderate mitral stenosis, chronic pericardial effusion, paroxysmal atrial flutter, and hypertension. She had a heart catheterization in 1996 revealed normal coronaries with mild MS and mild pulmonary hypertension. Exercise Myoview 09/14/2015 revealed any evidence 60% and no perfusion defects. She was admitted 09/14/2015 with atrial flutter. She was started on amiodarone but did not tolerate it due to nausea. She  was started on flecainide. She was seen on 10/21/16  due to atrial fibrillation. She had a cardioversion on 10/1. 2 hours later, she noted palpitations with a heart rate of 104. She has had one episode of atrial fibrillation since her diagnosis 13 months ago.  She continued to have arhythmia's despite AAD and eventually had an ablation 03/10/17. She called the office a few days after and described shortness of breath, some irregular rhythm and gain in weight by around 3 lbs. She was advised to take HCTZ which she had on hand, 1/2 tab x 3 days and would be seen back in the clinic. Today, she feels her weight is back to her usual baseline. Her shortness of breath is improved but not quite back to baseline. She is in SR today but has noticed some brief  irregularity at bedtime.  F/u in afib clinic, she feels improved from ablation 2/14. She took lasix for 3 days and her weight is down 3 lbs. But unfortunately, she is in afib today but unaware. According to her V/S chart from home her HR increased yesterday to around 100. She is 88 bpm in the office today.   Today, she denies symptoms of  chest pain,  orthopnea, PND, lower extremity edema, dizziness, presyncope, syncope, or neurologic sequela. The patient is tolerating medications without difficulties and is otherwise without complaint today.   Past Medical History:  Diagnosis Date    . Aortic regurgitation 08/13/2015  . Arthritis    "hands, wrists, back, feet, toes" (09/18/2015)  . Atypical chest pain 05/13/2016  . Chest pain    remote cath in 1996 with NORMAL coronaries noted  . Exogenous obesity    history of   . GERD (gastroesophageal reflux disease)   . Glaucoma, both eyes   . Headache, migraine    "stopped in my 50's" (09/18/2015)  . History of cardiovascular stress test 2007   showing no ischemia  . Hypertension   . Mitral stenosis with insufficiency   . Murmur    of mitral stenosis and moderate aortic insufficiency      . Palpitations    occasional  . Personal history of rheumatic heart disease   . SBE (subacute bacterial endocarditis)    prophylaxis  . Sliding hiatal hernia    Past Surgical History:  Procedure Laterality Date  . ABDOMINAL HYSTERECTOMY  1975  . APPENDECTOMY  1977  . ATRIAL FIBRILLATION ABLATION N/A 03/10/2017   Procedure: ATRIAL FIBRILLATION ABLATION;  Surgeon: [name], MD;  Location: [place] [facility];  Service: Cardiovascular;  Laterality: N/A;  . BUNIONECTOMY Right 2000s  . CARDIAC CATHETERIZATION  01/13/1995   normal coronary anatomy and mild mitral stenosis and mild pulmonary hypertension  . CARDIOVERSION N/A 09/22/2015   Procedure: CARDIOVERSION;  Surgeon: Jerline Pain, MD;  Location: [facility] ENDOSCOPY;  Service: Cardiovascular;  Laterality: N/A;  . CARDIOVERSION N/A 10/25/2016   Procedure: CARDIOVERSION;  Surgeon: Fay Records, MD;  Location: [facility]  ENDOSCOPY;  Service: Cardiovascular;  Laterality: N/A;  . CATARACT EXTRACTION W/ INTRAOCULAR LENS  IMPLANT, BILATERAL Bilateral 2013  . LAPAROSCOPIC CHOLECYSTECTOMY  1997  . TONSILLECTOMY AND ADENOIDECTOMY  1990s  . UVULOPALATOPHARYNGOPLASTY, TONSILLECTOMY AND SEPTOPLASTY  1990s    Current Outpatient Medications  Medication Sig Dispense Refill  . acetaminophen (TYLENOL) 500 MG tablet Take 500 mg by mouth every 6 (six) hours as needed for moderate pain or headache.    .  ALPRAZolam (XANAX) 0.25 MG tablet Take 0.125 mg by mouth 2 (two) times daily as needed for anxiety.     . B Complex Vitamins (VITAMIN B COMPLEX PO) Take 1 capsule by mouth daily.      . carvedilol (COREG) 6.25 MG tablet TAKE 1 TABLET BY MOUTH TWO  TIMES DAILY (Patient taking differently: Take 6.25 mg by mouth twice daily) 180 tablet 2  . Cholecalciferol (VITAMIN D) 2000 UNITS CAPS Take 2,000 Units by mouth daily.      Marland Kitchen conjugated estrogens (PREMARIN) vaginal cream Place 1 Applicatorful vaginally 3 (three) times a week.     Mary Farrell Calcium (STOOL SOFTENER PO) Take 1 capsule by mouth 2 (two) times daily.     . dorzolamide-timolol (COSOPT) 22.3-6.8 MG/ML ophthalmic solution PLACE 1 DROP INTO EACH EYE DAILY  6  . doxepin (SINEQUAN) 25 MG capsule Take 25 mg by mouth at bedtime.  8  . ELIQUIS 5 MG TABS tablet TAKE 1 TABLET BY MOUTH TWO  TIMES DAILY (Patient taking differently: TAKE 5 MG BY MOUTH TWO  TIMES DAILY) 180 tablet 1  . flecainide (TAMBOCOR) 50 MG tablet Take 2 tablets (100 mg total) by mouth 2 (two) times daily. 120 tablet 3  . gabapentin (NEURONTIN) 300 MG capsule Take 300 mg by mouth 3 (three) times daily.    . hydrochlorothiazide (MICROZIDE) 12.5 MG capsule TAKE 1 CAPSULE BY MOUTH  DAILY (Patient taking differently: Take 12.5 mg by mouth once daily as needed for BP) 90 capsule 3  . latanoprost (XALATAN) 0.005 % ophthalmic solution Place 1 drop into both eyes at bedtime.   6  . losartan (COZAAR) 100 MG tablet Take 100 mg by mouth daily.    . multivitamin (THERAGRAN) per tablet Take 1 tablet by mouth daily.      . NON FORMULARY Apply 1 application topically 2 (two) times daily as needed (for eczema). Traimcinolone/[employer] Moist Cream    . omeprazole (PRILOSEC) 40 MG capsule Take 40 mg by mouth daily.     Marland Kitchen oxybutynin (DITROPAN-XL) 10 MG 24 hr tablet Take 10 mg by mouth daily as needed (for bladder).      No current facility-administered medications for this encounter.     Allergies    Allergen Reactions  . Metoprolol Other (See Comments)    Mouth tastes like mold  . Oxaprozin Nausea Only    Social History   Socioeconomic History  . Marital status: Married    Spouse name: Not on file  . Number of children: Not on file  . Years of education: Not on file  . Highest education level: Not on file  Social Needs  . Financial resource strain: Not on file  . Food insecurity - worry: Not on file  . Food insecurity - inability: Not on file  . Transportation needs - medical: Not on file  . Transportation needs - non-medical: Not on file  Occupational History  . Occupation: Retired  Tobacco Use  . Smoking status: Never Smoker  . Smokeless tobacco: Never Used  Substance and Sexual Activity  . Alcohol use: No  . Drug use: No  . Sexual activity: Not Currently  Other Topics Concern  . Not on file  Social History Narrative   Lives with husband in [place].    Family History  Problem Relation Age of Onset  . Heart attack Mother   . Hypertension Brother   . Kidney disease Brother   . Diabetes Brother   . Heart failure Maternal Grandfather     ROS- All systems are reviewed and negative except as per the HPI above  Physical Exam: Vitals:   04/07/17 1423  BP: 114/64  Pulse: 88  Weight: 163 lb 3.2 oz (74 kg)  Height: 5' (1.524 m)   Wt Readings from Last 3 Encounters:  04/07/17 163 lb 3.2 oz (74 kg)  03/21/17 166 lb (75.3 kg)  03/11/17 163 lb 0.5 oz (73.9 kg)    Labs: Lab Results  Component Value Date   NA 135 03/21/2017   K 4.0 03/21/2017   CL 99 (L) 03/21/2017   CO2 27 03/21/2017   GLUCOSE 92 03/21/2017   BUN 8 03/21/2017   CREATININE 0.76 03/21/2017   CALCIUM 9.1 03/21/2017   MG 1.9 03/21/2017   Lab Results  Component Value Date   INR 1.04 09/18/2015   No results found for: CHOL, HDL, LDLCALC, TRIG   GEN- The patient is well appearing, alert and oriented x 3 today.   Head- normocephalic, atraumatic Eyes-  Sclera clear, conjunctiva  pink Ears- hearing intact Oropharynx- clear Neck- supple, no JVP Lymph- no cervical lymphadenopathy Lungs- Clear to ausculation bilaterally, normal work of breathing Heart- irregular rate and rhythm, + for S3  gallop,has been documented by Dr. Oval Linsey in the past. PMI not laterally displaced GI- soft, NT, ND, + BS Extremities- no clubbing, cyanosis, or edema MS- no significant deformity or atrophy Skin- no rash or lesion Psych- euthymic mood, full affect Neuro- strength and sensation are intact  EKG-afib at 88 bpm, qrs int 112 ms, qtc 464 ms Epic records reviewed    Assessment and Plan: 1. Paroxysmal afib  S/p ablation Initial shortness of breath improved, weight is back to baseline after short course of lasix Unfortunately back in rate controlled afib at 88 bpm, unaware Continue flecainde and carvedilol Continue eliquis 5 mg bid for chadsvasc score of 3   I will see her back on Monday and if persistently in afib , will plan cardioversion  Butch Penny C. Elianie Hubers, [facility] [address] [place], [place] [address] [phone]

## 2017-04-11 ENCOUNTER — Ambulatory Visit (HOSPITAL_COMMUNITY)
Admission: RE | Admit: 2017-04-11 | Discharge: 2017-04-11 | Disposition: A | Discharge status: Home / Self Care | Payer: Medicare Other | Source: Ambulatory Visit | Attending: Nurse Practitioner | Admitting: Nurse Practitioner

## 2017-04-11 ENCOUNTER — Encounter (HOSPITAL_COMMUNITY): Payer: Self-pay | Admitting: Nurse Practitioner

## 2017-04-11 DIAGNOSIS — I4891 Unspecified atrial fibrillation: Secondary | ICD-10-CM | POA: Diagnosis not present

## 2017-04-11 DIAGNOSIS — I4510 Unspecified right bundle-branch block: Secondary | ICD-10-CM | POA: Insufficient documentation

## 2017-04-11 LAB — CBC
HCT: 40 % (ref 36.0–46.0)
HEMOGLOBIN: 12.7 g/dL (ref 12.0–15.0)
MCH: 31.1 pg (ref 26.0–34.0)
MCHC: 31.8 g/dL (ref 30.0–36.0)
MCV: 97.8 fL (ref 78.0–100.0)
Platelets: 197 10*3/uL (ref 150–400)
RBC: 4.09 MIL/uL (ref 3.87–5.11)
RDW: 14.6 % (ref 11.5–15.5)
WBC: 6.5 10*3/uL (ref 4.0–10.5)

## 2017-04-11 LAB — BASIC METABOLIC PANEL
Anion gap: 10 (ref 5–15)
BUN: 7 mg/dL (ref 6–20)
CALCIUM: 8.7 mg/dL — AB (ref 8.9–10.3)
CHLORIDE: 100 mmol/L — AB (ref 101–111)
CO2: 25 mmol/L (ref 22–32)
CREATININE: 0.77 mg/dL (ref 0.44–1.00)
GFR calc Af Amer: >60 mL/min (ref >60)
GFR calc non Af Amer: >60 mL/min (ref >60)
Glucose, Bld: 127 mg/dL — ABNORMAL HIGH (ref 65–99)
Potassium: 3.6 mmol/L (ref 3.5–5.1)
SODIUM: 135 mmol/L (ref 135–145)

## 2017-04-11 NOTE — Progress Notes (Addendum)
Pt in for repeat EKG, to be reviewed by Roderic Palau, NP  Pt continues in rate controlled afib. She feels sure it has been persistent for many days. No missed doses of eliquis 5 mg bid. Will plan on next available appointment. Continue flecainide 50 mg bid and carvedilol 6.125 mg bid.

## 2017-04-11 NOTE — Patient Instructions (Signed)
Cardioversion scheduled for Friday, March 22nd  - Arrive at the [facility] and go to admitting at 9:30am  -Do not eat or drink anything after midnight the night prior to your procedure.  - Take all your medication with a sip of water prior to arrival.  - You will not be able to drive home after your procedure.

## 2017-04-12 ENCOUNTER — Encounter (HOSPITAL_COMMUNITY): Payer: Self-pay | Admitting: Nurse Practitioner

## 2017-04-15 ENCOUNTER — Ambulatory Visit (HOSPITAL_COMMUNITY): Payer: Medicare Other | Admitting: Anesthesiology

## 2017-04-15 ENCOUNTER — Encounter (HOSPITAL_COMMUNITY)
Admission: RE | Disposition: A | Discharge status: Home / Self Care | Payer: Self-pay | Source: Ambulatory Visit | Attending: Cardiovascular Disease

## 2017-04-15 ENCOUNTER — Encounter (HOSPITAL_COMMUNITY): Payer: Self-pay

## 2017-04-15 ENCOUNTER — Ambulatory Visit (HOSPITAL_COMMUNITY)
Admission: RE | Admit: 2017-04-15 | Discharge: 2017-04-15 | Disposition: A | Discharge status: Home / Self Care | Payer: Medicare Other | Source: Ambulatory Visit | Attending: Cardiovascular Disease | Admitting: Cardiovascular Disease

## 2017-04-15 ENCOUNTER — Other Ambulatory Visit: Payer: Self-pay

## 2017-04-15 DIAGNOSIS — I272 Pulmonary hypertension, unspecified: Secondary | ICD-10-CM | POA: Diagnosis not present

## 2017-04-15 DIAGNOSIS — Z888 Allergy status to other drugs, medicaments and biological substances status: Secondary | ICD-10-CM | POA: Diagnosis not present

## 2017-04-15 DIAGNOSIS — I480 Paroxysmal atrial fibrillation: Secondary | ICD-10-CM | POA: Insufficient documentation

## 2017-04-15 DIAGNOSIS — I350 Nonrheumatic aortic (valve) stenosis: Secondary | ICD-10-CM | POA: Diagnosis not present

## 2017-04-15 DIAGNOSIS — Z833 Family history of diabetes mellitus: Secondary | ICD-10-CM | POA: Insufficient documentation

## 2017-04-15 DIAGNOSIS — M19042 Primary osteoarthritis, left hand: Secondary | ICD-10-CM | POA: Insufficient documentation

## 2017-04-15 DIAGNOSIS — M19071 Primary osteoarthritis, right ankle and foot: Secondary | ICD-10-CM | POA: Diagnosis not present

## 2017-04-15 DIAGNOSIS — Z9842 Cataract extraction status, left eye: Secondary | ICD-10-CM | POA: Insufficient documentation

## 2017-04-15 DIAGNOSIS — M19041 Primary osteoarthritis, right hand: Secondary | ICD-10-CM | POA: Insufficient documentation

## 2017-04-15 DIAGNOSIS — I4892 Unspecified atrial flutter: Secondary | ICD-10-CM | POA: Diagnosis present

## 2017-04-15 DIAGNOSIS — I351 Nonrheumatic aortic (valve) insufficiency: Secondary | ICD-10-CM | POA: Insufficient documentation

## 2017-04-15 DIAGNOSIS — Z79899 Other long term (current) drug therapy: Secondary | ICD-10-CM | POA: Diagnosis not present

## 2017-04-15 DIAGNOSIS — I10 Essential (primary) hypertension: Secondary | ICD-10-CM | POA: Diagnosis not present

## 2017-04-15 DIAGNOSIS — Z7901 Long term (current) use of anticoagulants: Secondary | ICD-10-CM | POA: Insufficient documentation

## 2017-04-15 DIAGNOSIS — I481 Persistent atrial fibrillation: Secondary | ICD-10-CM | POA: Diagnosis not present

## 2017-04-15 DIAGNOSIS — K449 Diaphragmatic hernia without obstruction or gangrene: Secondary | ICD-10-CM | POA: Diagnosis not present

## 2017-04-15 DIAGNOSIS — M19072 Primary osteoarthritis, left ankle and foot: Secondary | ICD-10-CM | POA: Insufficient documentation

## 2017-04-15 DIAGNOSIS — K219 Gastro-esophageal reflux disease without esophagitis: Secondary | ICD-10-CM | POA: Diagnosis not present

## 2017-04-15 DIAGNOSIS — Z8249 Family history of ischemic heart disease and other diseases of the circulatory system: Secondary | ICD-10-CM | POA: Insufficient documentation

## 2017-04-15 DIAGNOSIS — M4690 Unspecified inflammatory spondylopathy, site unspecified: Secondary | ICD-10-CM | POA: Insufficient documentation

## 2017-04-15 DIAGNOSIS — Z9841 Cataract extraction status, right eye: Secondary | ICD-10-CM | POA: Insufficient documentation

## 2017-04-15 DIAGNOSIS — Z9049 Acquired absence of other specified parts of digestive tract: Secondary | ICD-10-CM | POA: Insufficient documentation

## 2017-04-15 DIAGNOSIS — Z90710 Acquired absence of both cervix and uterus: Secondary | ICD-10-CM | POA: Diagnosis not present

## 2017-04-15 DIAGNOSIS — Z841 Family history of disorders of kidney and ureter: Secondary | ICD-10-CM | POA: Insufficient documentation

## 2017-04-15 DIAGNOSIS — H409 Unspecified glaucoma: Secondary | ICD-10-CM | POA: Insufficient documentation

## 2017-04-15 DIAGNOSIS — R002 Palpitations: Secondary | ICD-10-CM | POA: Insufficient documentation

## 2017-04-15 HISTORY — PX: CARDIOVERSION: SHX1299

## 2017-04-15 SURGERY — CARDIOVERSION
Anesthesia: General

## 2017-04-15 MED ORDER — LIDOCAINE HCL (CARDIAC) 20 MG/ML IV SOLN
INTRAVENOUS | Status: DC | PRN
  Administered 2017-04-15: 20 mg via INTRAVENOUS

## 2017-04-15 MED ORDER — SODIUM CHLORIDE 0.9 % IV SOLN
INTRAVENOUS | Status: DC | PRN
  Administered 2017-04-15: 10:00:00 via INTRAVENOUS

## 2017-04-15 MED ORDER — PROPOFOL 10 MG/ML IV BOLUS
INTRAVENOUS | Status: DC | PRN
  Administered 2017-04-15: 50 mg via INTRAVENOUS

## 2017-04-15 NOTE — Anesthesia Postprocedure Evaluation (Signed)
Anesthesia Post Note  Patient: Mary Farrell  Procedure(s) Performed: CARDIOVERSION (N/A )     Patient location during evaluation: PACU Anesthesia Type: General Level of consciousness: awake and alert Pain management: pain level controlled Vital Signs Assessment: post-procedure vital signs reviewed and stable Respiratory status: spontaneous breathing, nonlabored ventilation, respiratory function stable and patient connected to nasal cannula oxygen Cardiovascular status: blood pressure returned to baseline and stable Postop Assessment: no apparent nausea or vomiting Anesthetic complications: no    Last Vitals:  Vitals Value Taken Time  BP    Temp    Pulse    Resp    SpO2      Last Pain:  Vitals:   04/15/17 1145  TempSrc:   PainSc: 0-No pain                 Tiajuana Amass

## 2017-04-15 NOTE — Interval H&P Note (Signed)
History and Physical Interval Note:  04/15/2017 9:43 AM  Mary Farrell  has presented today for surgery, with the diagnosis of AFIB  The various methods of treatment have been discussed with the patient and family. After consideration of risks, benefits and other options for treatment, the patient has consented to  Procedure(s): CARDIOVERSION (N/A) as a surgical intervention .  The patient's history has been reviewed, patient examined, no change in status, stable for surgery.  I have reviewed the patient's chart and labs.  Questions were answered to the patient's satisfaction.     Jenkins Rouge

## 2017-04-15 NOTE — Transfer of Care (Signed)
Immediate Anesthesia Transfer of Care Note  Patient: Mary Farrell  Procedure(s) Performed: CARDIOVERSION (N/A )  Patient Location: Endoscopy Unit  Anesthesia Type:General  Level of Consciousness: awake, oriented and patient cooperative  Airway & Oxygen Therapy: Patient Spontanous Breathing and Patient connected to nasal cannula oxygen  Post-op Assessment: Report given to RN and Post -op Vital signs reviewed and stable  Post vital signs: Reviewed  Last Vitals:  Vitals Value Taken Time  BP 133/58 04/15/2017 11:33 AM  Temp    Pulse 72 04/15/2017 11:35 AM  Resp 16 04/15/2017 11:35 AM  SpO2 98 % 04/15/2017 11:35 AM  Vitals shown include unvalidated device data.  Last Pain:  Vitals:   04/15/17 0935  TempSrc: Oral  PainSc: 0-No pain         Complications: No apparent anesthesia complications

## 2017-04-15 NOTE — CV Procedure (Signed)
[facility] Anesthesia Dr Ola Spurr  On Rx anticoagulation No missed doses  DCC x 1 150 J  Converted to NSR rate 80's   No immediate neurologic sequelae

## 2017-04-15 NOTE — Discharge Instructions (Signed)
Electrical Cardioversion, Care After °This sheet gives you information about how to care for yourself after your procedure. Your health care provider may also give you more specific instructions. If you have problems or questions, contact your health care provider. °What can I expect after the procedure? °After the procedure, it is common to have: °· Some redness on the skin where the shocks were given. ° °Follow these instructions at home: °· Do not drive for 24 hours if you were given a medicine to help you relax (sedative). °· Take over-the-counter and prescription medicines only as told by your health care provider. °· Ask your health care provider how to check your pulse. Check it often. °· Rest for 48 hours after the procedure or as told by your health care provider. °· Avoid or limit your caffeine use as told by your health care provider. °Contact a health care provider if: °· You feel like your heart is beating too quickly or your pulse is not regular. °· You have a serious muscle cramp that does not go away. °Get help right away if: °· You have discomfort in your chest. °· You are dizzy or you feel faint. °· You have trouble breathing or you are short of breath. °· Your speech is slurred. °· You have trouble moving an arm or leg on one side of your body. °· Your fingers or toes turn cold or blue. °This information is not intended to replace advice given to you by your health care provider. Make sure you discuss any questions you have with your health care provider. °Document Released: 11/01/2012 Document Revised: 08/15/2015 Document Reviewed: 07/18/2015 °Elsevier Interactive Patient Education © 2018 [employer] ° °

## 2017-04-15 NOTE — Anesthesia Procedure Notes (Signed)
Procedure Name: General with mask airway Date/Time: 04/15/2017 11:19 AM Performed by: Jenne Campus, CRNA Pre-anesthesia Checklist: Patient identified, Emergency Drugs available, Suction available, Patient being monitored and Timeout performed Patient Re-evaluated:Patient Re-evaluated prior to induction Oxygen Delivery Method: Ambu bag

## 2017-04-15 NOTE — Anesthesia Preprocedure Evaluation (Addendum)
Anesthesia Evaluation  Patient identified by MRN, date of birth, ID band Patient awake    Reviewed: Allergy & Precautions, NPO status , Patient's Chart, lab work & pertinent test results, reviewed documented beta blocker date and time   Airway Mallampati: II  TM Distance: <3 FB Neck ROM: Full    Dental  (+) Dental Advisory Given, Partial Upper   Pulmonary shortness of breath,    breath sounds clear to auscultation       Cardiovascular hypertension, Pt. on medications and Pt. on home beta blockers + dysrhythmias Atrial Fibrillation + Valvular Problems/Murmurs  Rhythm:Irregular Rate:Normal  LVEF 60-65%.There was moderate lokalized pericardial effusion around the inferior and inferolateral walls unchanged from prior exam on 10/22/15. No evidence for tamponade.  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 There is moderate mitral stenosis and  midl regurgitation.   Neuro/Psych    GI/Hepatic Neg liver ROS, hiatal hernia, GERD  ,  Endo/Other  negative endocrine ROS  Renal/GU negative Renal ROS     Musculoskeletal   Abdominal   Peds  Hematology   Anesthesia Other Findings   Reproductive/Obstetrics                            Anesthesia Physical  Anesthesia Plan  ASA: III  Anesthesia Plan: General   Post-op Pain Management:    Induction: Intravenous  PONV Risk Score and Plan: 3 and Ondansetron, Propofol infusion and Treatment may vary due to age or medical condition  Airway Management Planned: Natural Airway and Mask  Additional Equipment:   Intra-op Plan:   Post-operative Plan:   Informed Consent: I have reviewed the patients History and Physical, chart, labs and discussed the procedure including the risks, benefits and alternatives for the proposed anesthesia with the patient or authorized representative who has indicated his/her understanding  and acceptance.     Plan Discussed with: CRNA  Anesthesia Plan Comments:         Anesthesia Quick Evaluation

## 2017-04-16 ENCOUNTER — Other Ambulatory Visit: Payer: Self-pay | Admitting: Cardiology

## 2017-04-16 ENCOUNTER — Telehealth: Payer: Self-pay | Admitting: Cardiology

## 2017-04-16 MED ORDER — ONDANSETRON HCL 4 MG PO TABS: 4.0000 mg | ORAL_TABLET | Freq: Every day | ORAL | 0 refills | Status: DC | PRN

## 2017-04-16 NOTE — Telephone Encounter (Signed)
Pt called with nausea and weakness. HR 94, B/P 153/70. ? Recurrent AF. I suggested she take an extra Coreg today and push fluids, call AF clinic Monday. She requested something for nausea be called in and I'll order that. She indicated she did not feel she needed to come to the ED.  Kerin Ransom PA-C 04/16/2017 8:36 AM

## 2017-04-18 ENCOUNTER — Encounter (HOSPITAL_COMMUNITY): Payer: Self-pay | Admitting: Cardiovascular Disease

## 2017-04-19 ENCOUNTER — Other Ambulatory Visit: Payer: Self-pay | Admitting: Cardiovascular Disease

## 2017-04-19 NOTE — Telephone Encounter (Signed)
Please review for refill, Thanks !  

## 2017-04-19 NOTE — Telephone Encounter (Signed)
REFILL 

## 2017-04-25 ENCOUNTER — Encounter (HOSPITAL_COMMUNITY): Payer: Self-pay | Admitting: Nurse Practitioner

## 2017-04-25 ENCOUNTER — Ambulatory Visit (HOSPITAL_COMMUNITY)
Admission: RE | Admit: 2017-04-25 | Discharge: 2017-04-25 | Disposition: A | Discharge status: Home / Self Care | Payer: Medicare Other | Source: Ambulatory Visit | Attending: Nurse Practitioner | Admitting: Nurse Practitioner

## 2017-04-25 VITALS — BP 156/92 | HR 106 | Ht 60.0 in | Wt 167.0 lb

## 2017-04-25 DIAGNOSIS — I481 Persistent atrial fibrillation: Secondary | ICD-10-CM | POA: Insufficient documentation

## 2017-04-25 DIAGNOSIS — Z9049 Acquired absence of other specified parts of digestive tract: Secondary | ICD-10-CM | POA: Insufficient documentation

## 2017-04-25 DIAGNOSIS — I10 Essential (primary) hypertension: Secondary | ICD-10-CM | POA: Diagnosis not present

## 2017-04-25 DIAGNOSIS — I4892 Unspecified atrial flutter: Secondary | ICD-10-CM | POA: Insufficient documentation

## 2017-04-25 DIAGNOSIS — Z7901 Long term (current) use of anticoagulants: Secondary | ICD-10-CM | POA: Insufficient documentation

## 2017-04-25 DIAGNOSIS — Z8249 Family history of ischemic heart disease and other diseases of the circulatory system: Secondary | ICD-10-CM | POA: Insufficient documentation

## 2017-04-25 DIAGNOSIS — Z79899 Other long term (current) drug therapy: Secondary | ICD-10-CM | POA: Diagnosis not present

## 2017-04-25 DIAGNOSIS — R0600 Dyspnea, unspecified: Secondary | ICD-10-CM

## 2017-04-25 DIAGNOSIS — Z9889 Other specified postprocedural states: Secondary | ICD-10-CM | POA: Insufficient documentation

## 2017-04-25 DIAGNOSIS — Z9841 Cataract extraction status, right eye: Secondary | ICD-10-CM | POA: Diagnosis not present

## 2017-04-25 DIAGNOSIS — J9811 Atelectasis: Secondary | ICD-10-CM | POA: Insufficient documentation

## 2017-04-25 DIAGNOSIS — H409 Unspecified glaucoma: Secondary | ICD-10-CM | POA: Diagnosis not present

## 2017-04-25 DIAGNOSIS — K219 Gastro-esophageal reflux disease without esophagitis: Secondary | ICD-10-CM | POA: Diagnosis not present

## 2017-04-25 DIAGNOSIS — Z833 Family history of diabetes mellitus: Secondary | ICD-10-CM | POA: Insufficient documentation

## 2017-04-25 DIAGNOSIS — Z961 Presence of intraocular lens: Secondary | ICD-10-CM | POA: Diagnosis not present

## 2017-04-25 DIAGNOSIS — I484 Atypical atrial flutter: Secondary | ICD-10-CM

## 2017-04-25 DIAGNOSIS — I4891 Unspecified atrial fibrillation: Secondary | ICD-10-CM | POA: Diagnosis present

## 2017-04-25 DIAGNOSIS — Z9842 Cataract extraction status, left eye: Secondary | ICD-10-CM | POA: Insufficient documentation

## 2017-04-25 DIAGNOSIS — Z888 Allergy status to other drugs, medicaments and biological substances status: Secondary | ICD-10-CM | POA: Diagnosis not present

## 2017-04-25 DIAGNOSIS — Z90710 Acquired absence of both cervix and uterus: Secondary | ICD-10-CM | POA: Insufficient documentation

## 2017-04-25 NOTE — Progress Notes (Signed)
error 

## 2017-04-25 NOTE — Progress Notes (Addendum)
Primary Care Physician: Lawerance Cruel, MD Referring Physician: Dr. Myles Lipps Mary Farrell is a 72 y.o. female with a h/o recent afib ablation, 03/10/17  that had returned to afib after ablation and had successful cardioversion. Unfortunately, the evening after cardioversion, she developed chills/fever and a cough. She is finishing antibiotics and cough med and feels improved however she went back out of rhythm  the next day and continues in atrial flutter.  Today, she denies symptoms of palpitations, chest pain, shortness of breath, orthopnea, PND, lower extremity edema, dizziness, presyncope, syncope, or neurologic sequela. The patient is tolerating medications without difficulties and is otherwise without complaint today.   Past Medical History:  Diagnosis Date  . Aortic regurgitation 08/13/2015  . Arthritis    "hands, wrists, back, feet, toes" (09/18/2015)  . Atypical chest pain 05/13/2016  . Chest pain    remote cath in 1996 with NORMAL coronaries noted  . Exogenous obesity    history of   . GERD (gastroesophageal reflux disease)   . Glaucoma, both eyes   . Headache, migraine    "stopped in my 50's" (09/18/2015)  . History of cardiovascular stress test 2007   showing no ischemia  . Hypertension   . Mitral stenosis with insufficiency   . Murmur    of mitral stenosis and moderate aortic insufficiency      . Palpitations    occasional  . Personal history of rheumatic heart disease   . SBE (subacute bacterial endocarditis)    prophylaxis  . Sliding hiatal hernia    Past Surgical History:  Procedure Laterality Date  . ABDOMINAL HYSTERECTOMY  1975  . APPENDECTOMY  1977  . ATRIAL FIBRILLATION ABLATION N/A 03/10/2017   Procedure: ATRIAL FIBRILLATION ABLATION;  Surgeon: [name], MD;  Location: [place] [facility];  Service: Cardiovascular;  Laterality: N/A;  . BUNIONECTOMY Right 2000s  . CARDIAC CATHETERIZATION  01/13/1995   normal coronary anatomy and mild  mitral stenosis and mild pulmonary hypertension  . CARDIOVERSION N/A 09/22/2015   Procedure: CARDIOVERSION;  Surgeon: Jerline Pain, MD;  Location: [facility] ENDOSCOPY;  Service: Cardiovascular;  Laterality: N/A;  . CARDIOVERSION N/A 10/25/2016   Procedure: CARDIOVERSION;  Surgeon: Fay Records, MD;  Location: [facility] ENDOSCOPY;  Service: Cardiovascular;  Laterality: N/A;  . CARDIOVERSION N/A 04/15/2017   Procedure: CARDIOVERSION;  Surgeon: Josue Hector, MD;  Location: [facility] ENDOSCOPY;  Service: Cardiovascular;  Laterality: N/A;  . CATARACT EXTRACTION W/ INTRAOCULAR LENS  IMPLANT, BILATERAL Bilateral 2013  . LAPAROSCOPIC CHOLECYSTECTOMY  1997  . TONSILLECTOMY AND ADENOIDECTOMY  1990s  . UVULOPALATOPHARYNGOPLASTY, TONSILLECTOMY AND SEPTOPLASTY  1990s    Current Outpatient Medications  Medication Sig Dispense Refill  . acetaminophen (TYLENOL) 500 MG tablet Take 500 mg by mouth every 6 (six) hours as needed for moderate pain or headache.    . ALPRAZolam (XANAX) 0.25 MG tablet Take 0.125 mg by mouth 2 (two) times daily as needed for anxiety.     . B Complex Vitamins (VITAMIN B COMPLEX PO) Take 1 capsule by mouth daily.      . carvedilol (COREG) 6.25 MG tablet TAKE 1 TABLET BY MOUTH TWO  TIMES DAILY (Patient taking differently: Take 6.25 mg by mouth twice daily) 180 tablet 2  . Cholecalciferol (VITAMIN D) 2000 UNITS CAPS Take 2,000 Units by mouth daily.      Marland Kitchen conjugated estrogens (PREMARIN) vaginal cream Place 1 Applicatorful vaginally 3 (three) times a week.     Mariane Baumgarten  Calcium (STOOL SOFTENER PO) Take 1 capsule by mouth 2 (two) times daily.     . dorzolamide-timolol (COSOPT) 22.3-6.8 MG/ML ophthalmic solution PLACE 1 DROP INTO EACH EYE DAILY  6  . doxepin (SINEQUAN) 25 MG capsule Take 25 mg by mouth at bedtime.  8  . ELIQUIS 5 MG TABS tablet TAKE 1 TABLET BY MOUTH TWO  TIMES DAILY (Patient taking differently: TAKE 5 MG BY MOUTH TWO TIMES DAILY) 180 tablet 1  . flecainide (TAMBOCOR) 50 MG tablet Take 2  tablets (100 mg total) by mouth 2 (two) times daily. 120 tablet 3  . gabapentin (NEURONTIN) 300 MG capsule Take 300 mg by mouth 2 (two) times daily.     Marland Kitchen guaiFENesin-codeine 100-10 MG/5ML syrup TAKE 1 TEASPOONFUL EVERY 4 TO 6 HOURS AS DIRECTED  0  . hydrochlorothiazide (MICROZIDE) 12.5 MG capsule TAKE 1 CAPSULE BY MOUTH  DAILY (Patient taking differently: Take 12.5 mg by mouth once daily) 90 capsule 3  . latanoprost (XALATAN) 0.005 % ophthalmic solution Place 1 drop into both eyes at bedtime.   6  . losartan (COZAAR) 100 MG tablet TAKE 1 TABLET BY MOUTH  DAILY 90 tablet 0  . multivitamin (THERAGRAN) per tablet Take 1 tablet by mouth daily.      . NON FORMULARY Apply 1 application topically 2 (two) times daily as needed (for eczema). Traimcinolone/[facility] Moist Cream    . omeprazole (PRILOSEC) 40 MG capsule Take 40 mg by mouth daily.     . ondansetron (ZOFRAN) 4 MG tablet Take 1 tablet (4 mg total) by mouth daily as needed for nausea or vomiting. 5 tablet 0  . oxybutynin (DITROPAN-XL) 10 MG 24 hr tablet Take 10 mg by mouth daily as needed (for bladder).      No current facility-administered medications for this encounter.     Allergies  Allergen Reactions  . Metoprolol Other (See Comments)    Mouth tastes like mold  . Oxaprozin Nausea Only    Social History   Socioeconomic History  . Marital status: Married    Spouse name: Not on file  . Number of children: Not on file  . Years of education: Not on file  . Highest education level: Not on file  Occupational History  . Occupation: Retired  Scientific laboratory technician  . Financial resource strain: Not on file  . Food insecurity:    Worry: Not on file    Inability: Not on file  . Transportation needs:    Medical: Not on file    Non-medical: Not on file  Tobacco Use  . Smoking status: Never Smoker  . Smokeless tobacco: Never Used  Substance and Sexual Activity  . Alcohol use: No  . Drug use: No  . Sexual activity: Not Currently  Lifestyle  .  Physical activity:    Days per week: Not on file    Minutes per session: Not on file  . Stress: Not on file  Relationships  . Social connections:    Talks on phone: Not on file    Gets together: Not on file    Attends religious service: Not on file    Active member of club or organization: Not on file    Attends meetings of clubs or organizations: Not on file    Relationship status: Not on file  . Intimate partner violence:    Fear of current or ex partner: Not on file    Emotionally abused: Not on file    Physically abused: Not on  file    Forced sexual activity: Not on file  Other Topics Concern  . Not on file  Social History Narrative   Lives with husband in [place].    Family History  Problem Relation Age of Onset  . Heart attack Mother   . Hypertension Brother   . Kidney disease Brother   . Diabetes Brother   . Heart failure Maternal Grandfather     ROS- All systems are reviewed and negative except as per the HPI above  Physical Exam: Vitals:   04/25/17 1426  BP: (!) 156/92  Pulse: (!) 106  Weight: 167 lb (75.8 kg)  Height: 5' (1.524 m)   Wt Readings from Last 3 Encounters:  04/25/17 167 lb (75.8 kg)  04/15/17 164 lb (74.4 kg)  04/11/17 164 lb (74.4 kg)    Labs: Lab Results  Component Value Date   NA 135 04/11/2017   K 3.6 04/11/2017   CL 100 (L) 04/11/2017   CO2 25 04/11/2017   GLUCOSE 127 (H) 04/11/2017   BUN 7 04/11/2017   CREATININE 0.77 04/11/2017   CALCIUM 8.7 (L) 04/11/2017   MG 1.9 03/21/2017   Lab Results  Component Value Date   INR 1.04 09/18/2015   No results found for: CHOL, HDL, LDLCALC, TRIG   GEN- The patient is well appearing, alert and oriented x 3 today.   Head- normocephalic, atraumatic Eyes-  Sclera clear, conjunctiva pink Ears- hearing intact Oropharynx- clear Neck- supple, no JVP Lymph- no cervical lymphadenopathy Lungs- Decreased breath sounds on left,  normal work of breathing Heart- irregular rate and rhythm,  no murmurs, rubs or gallops, PMI not laterally displaced GI- soft, NT, ND, + BS Extremities- no clubbing, cyanosis, or edema MS- no significant deformity or atrophy Skin- no rash or lesion Psych- euthymic mood, full affect Neuro- strength and sensation are intact  EKG-atrial flutter with 2:1 conduction, IRBBB, qrs int 100 ms, qtc 454 ms Epic records reviewed    Assessment and Plan: 1. Persistent fib/flutter S/p ablation 03/10/17 Successful cardioversion  but ERAF in setting of URI CXR today for diminished breath sounds on the left Will discuss with Dr. Curt Bears  how next to approach to restore SR as she continues on flecainide 100 mg bid and inform pt as to plan Continue  eliquis 5 mg bid   4/8- I discussed with Dr. Curt Bears and he feels that it would be prudent to let pt completely recover from URI and heal internally from ablation a while longer and then reattempt cardioversion in a couple of weeks. Discussed with pt and she  has f/u with Dr. Oval Linsey 4/23 and can discuss further/schedule DCCV at that appointment.  Geroge Baseman Vercie Pokorny, [facility] [address] [place], [place] [address] [phone]

## 2017-05-02 NOTE — Addendum Note (Signed)
Encounter addended by: Sherran Needs, NP on: 05/02/2017 8:52 AM  Actions taken: Sign clinical note

## 2017-05-06 ENCOUNTER — Ambulatory Visit: Payer: Medicare Other | Admitting: Cardiovascular Disease

## 2017-05-10 ENCOUNTER — Encounter (HOSPITAL_COMMUNITY): Payer: Self-pay | Admitting: Nurse Practitioner

## 2017-05-10 ENCOUNTER — Ambulatory Visit (HOSPITAL_COMMUNITY)
Admission: RE | Admit: 2017-05-10 | Discharge: 2017-05-10 | Disposition: A | Discharge status: Home / Self Care | Payer: Medicare Other | Source: Ambulatory Visit | Attending: Nurse Practitioner | Admitting: Nurse Practitioner

## 2017-05-10 VITALS — BP 122/76 | HR 97 | Ht 60.0 in | Wt 168.0 lb

## 2017-05-10 DIAGNOSIS — R0600 Dyspnea, unspecified: Secondary | ICD-10-CM | POA: Diagnosis not present

## 2017-05-10 DIAGNOSIS — I10 Essential (primary) hypertension: Secondary | ICD-10-CM | POA: Diagnosis not present

## 2017-05-10 DIAGNOSIS — Z79899 Other long term (current) drug therapy: Secondary | ICD-10-CM | POA: Diagnosis not present

## 2017-05-10 DIAGNOSIS — Z90710 Acquired absence of both cervix and uterus: Secondary | ICD-10-CM | POA: Insufficient documentation

## 2017-05-10 DIAGNOSIS — I4892 Unspecified atrial flutter: Secondary | ICD-10-CM | POA: Insufficient documentation

## 2017-05-10 DIAGNOSIS — K219 Gastro-esophageal reflux disease without esophagitis: Secondary | ICD-10-CM | POA: Diagnosis not present

## 2017-05-10 DIAGNOSIS — I484 Atypical atrial flutter: Secondary | ICD-10-CM

## 2017-05-10 DIAGNOSIS — Z9889 Other specified postprocedural states: Secondary | ICD-10-CM | POA: Diagnosis not present

## 2017-05-10 LAB — BASIC METABOLIC PANEL
Anion gap: 8 (ref 5–15)
BUN: 9 mg/dL (ref 6–20)
CHLORIDE: 104 mmol/L (ref 101–111)
CO2: 25 mmol/L (ref 22–32)
Calcium: 8.9 mg/dL (ref 8.9–10.3)
Creatinine, Ser: 0.84 mg/dL (ref 0.44–1.00)
GFR calc Af Amer: >60 mL/min (ref >60)
GFR calc non Af Amer: >60 mL/min (ref >60)
GLUCOSE: 120 mg/dL — AB (ref 65–99)
Potassium: 4 mmol/L (ref 3.5–5.1)
Sodium: 137 mmol/L (ref 135–145)

## 2017-05-10 LAB — CBC
HCT: 41.2 % (ref 36.0–46.0)
HEMOGLOBIN: 12.9 g/dL (ref 12.0–15.0)
MCH: 30.9 pg (ref 26.0–34.0)
MCHC: 31.3 g/dL (ref 30.0–36.0)
MCV: 98.8 fL (ref 78.0–100.0)
PLATELETS: 262 10*3/uL (ref 150–400)
RBC: 4.17 MIL/uL (ref 3.87–5.11)
RDW: 15.5 % (ref 11.5–15.5)
WBC: 7.6 10*3/uL (ref 4.0–10.5)

## 2017-05-10 MED ORDER — FUROSEMIDE 20 MG PO TABS: ORAL_TABLET | ORAL | 1 refills | Status: DC

## 2017-05-10 NOTE — Patient Instructions (Signed)
Cardioversion scheduled for Monday, April 22nd  - Arrive at the [facility] and go to admitting at 8:30am  -Do not eat or drink anything after midnight the night prior to your procedure.  - Take all your medication with a sip of water prior to arrival.  - You will not be able to drive home after your procedure.   Lasix 20mg  once a day for the next 3 days then only as needed for weight gain/swelling.   Scheduling will be in touch with you to see Dr. Curt Bears in 1 week after cardioversion.

## 2017-05-10 NOTE — Progress Notes (Signed)
Primary Care Physician: Lawerance Cruel, MD Referring Physician: Dr. Myles Lipps Mary Farrell is a 72 y.o. female with a h/o recent afib ablation, 03/10/17  that had returned to afib after ablation and had successful cardioversion. Unfortunately, the evening after cardioversion, she developed chills/fever and a cough.  I discussed plan with Dr. Curt Bears and he felt that if she could heal a little longer from the procedure and let her URI resolve, then a repeat cardioversion may be more effective. She continues on flecainide. She feels her URI is resolving. Her weight is up 4 lbs and she is noticing more shortness of breath. Will give her lasix for 3 days.  Today, she denies symptoms of palpitations, chest pain,  orthopnea, PND, lower extremity edema, dizziness, presyncope, syncope, or neurologic sequela. + for shortness of breath, weight gain, fatgiue. The patient is tolerating medications without difficulties and is otherwise without complaint today.   Past Medical History:  Diagnosis Date  . Aortic regurgitation 08/13/2015  . Arthritis    "hands, wrists, back, feet, toes" (09/18/2015)  . Atypical chest pain 05/13/2016  . Chest pain    remote cath in 1996 with NORMAL coronaries noted  . Exogenous obesity    history of   . GERD (gastroesophageal reflux disease)   . Glaucoma, both eyes   . Headache, migraine    "stopped in my 50's" (09/18/2015)  . History of cardiovascular stress test 2007   showing no ischemia  . Hypertension   . Mitral stenosis with insufficiency   . Murmur    of mitral stenosis and moderate aortic insufficiency      . Palpitations    occasional  . Personal history of rheumatic heart disease   . SBE (subacute bacterial endocarditis)    prophylaxis  . Sliding hiatal hernia    Past Surgical History:  Procedure Laterality Date  . ABDOMINAL HYSTERECTOMY  1975  . APPENDECTOMY  1977  . ATRIAL FIBRILLATION ABLATION N/A 03/10/2017   Procedure: ATRIAL FIBRILLATION  ABLATION;  Surgeon: [name], MD;  Location: [facility];  Service: Cardiovascular;  Laterality: N/A;  . BUNIONECTOMY Right 2000s  . CARDIAC CATHETERIZATION  01/13/1995   normal coronary anatomy and mild mitral stenosis and mild pulmonary hypertension  . CARDIOVERSION N/A 09/22/2015   Procedure: CARDIOVERSION;  Surgeon: Jerline Pain, MD;  Location: [facility] ENDOSCOPY;  Service: Cardiovascular;  Laterality: N/A;  . CARDIOVERSION N/A 10/25/2016   Procedure: CARDIOVERSION;  Surgeon: Fay Records, MD;  Location: [facility] ENDOSCOPY;  Service: Cardiovascular;  Laterality: N/A;  . CARDIOVERSION N/A 04/15/2017   Procedure: CARDIOVERSION;  Surgeon: Josue Hector, MD;  Location: [facility] ENDOSCOPY;  Service: Cardiovascular;  Laterality: N/A;  . CATARACT EXTRACTION W/ INTRAOCULAR LENS  IMPLANT, BILATERAL Bilateral 2013  . LAPAROSCOPIC CHOLECYSTECTOMY  1997  . TONSILLECTOMY AND ADENOIDECTOMY  1990s  . UVULOPALATOPHARYNGOPLASTY, TONSILLECTOMY AND SEPTOPLASTY  1990s    Current Outpatient Medications  Medication Sig Dispense Refill  . acetaminophen (TYLENOL) 500 MG tablet Take 500 mg by mouth every 6 (six) hours as needed for moderate pain or headache.    . ALPRAZolam (XANAX) 0.25 MG tablet Take 0.125 mg by mouth 2 (two) times daily as needed for anxiety.     . B Complex Vitamins (VITAMIN B COMPLEX PO) Take 1 capsule by mouth daily.      . carvedilol (COREG) 6.25 MG tablet TAKE 1 TABLET BY MOUTH TWO  TIMES DAILY (Patient taking differently: Take 6.25 mg by mouth  twice daily) 180 tablet 2  . Cholecalciferol (VITAMIN D) 2000 UNITS CAPS Take 2,000 Units by mouth daily.      Marland Kitchen conjugated estrogens (PREMARIN) vaginal cream Place 1 Applicatorful vaginally 3 (three) times a week.     Mariane Baumgarten Calcium (STOOL SOFTENER PO) Take 1 capsule by mouth 2 (two) times daily.     . dorzolamide-timolol (COSOPT) 22.3-6.8 MG/ML ophthalmic solution PLACE 1 DROP INTO EACH EYE DAILY  6  . doxepin (SINEQUAN) 25 MG capsule Take  25 mg by mouth at bedtime.  8  . ELIQUIS 5 MG TABS tablet TAKE 1 TABLET BY MOUTH TWO  TIMES DAILY (Patient taking differently: TAKE 5 MG BY MOUTH TWO TIMES DAILY) 180 tablet 1  . flecainide (TAMBOCOR) 50 MG tablet Take 2 tablets (100 mg total) by mouth 2 (two) times daily. 120 tablet 3  . gabapentin (NEURONTIN) 300 MG capsule Take 300 mg by mouth 2 (two) times daily.     Marland Kitchen guaiFENesin (MUCINEX) 600 MG 12 hr tablet Take by mouth 2 (two) times daily.    . hydrochlorothiazide (MICROZIDE) 12.5 MG capsule TAKE 1 CAPSULE BY MOUTH  DAILY (Patient taking differently: Take 12.5 mg by mouth once daily) 90 capsule 3  . latanoprost (XALATAN) 0.005 % ophthalmic solution Place 1 drop into both eyes at bedtime.   6  . losartan (COZAAR) 100 MG tablet TAKE 1 TABLET BY MOUTH  DAILY 90 tablet 0  . multivitamin (THERAGRAN) per tablet Take 1 tablet by mouth daily.      . NON FORMULARY Apply 1 application topically 2 (two) times daily as needed (for eczema). Traimcinolone/[employer] Moist Cream    . omeprazole (PRILOSEC) 40 MG capsule Take 40 mg by mouth daily.     . ondansetron (ZOFRAN) 4 MG tablet Take 1 tablet (4 mg total) by mouth daily as needed for nausea or vomiting. 5 tablet 0  . oxybutynin (DITROPAN-XL) 10 MG 24 hr tablet Take 10 mg by mouth daily as needed (for bladder).     . furosemide (LASIX) 20 MG tablet Take 1 tablet daily for 3 days then as needed for weight gain only 15 tablet 1   No current facility-administered medications for this encounter.     Allergies  Allergen Reactions  . Metoprolol Other (See Comments)    Mouth tastes like mold  . Oxaprozin Nausea Only    Social History   Socioeconomic History  . Marital status: Married    Spouse name: Not on file  . Number of children: Not on file  . Years of education: Not on file  . Highest education level: Not on file  Occupational History  . Occupation: Retired  Scientific laboratory technician  . Financial resource strain: Not on file  . Food insecurity:     Worry: Not on file    Inability: Not on file  . Transportation needs:    Medical: Not on file    Non-medical: Not on file  Tobacco Use  . Smoking status: Never Smoker  . Smokeless tobacco: Never Used  Substance and Sexual Activity  . Alcohol use: No  . Drug use: No  . Sexual activity: Not Currently  Lifestyle  . Physical activity:    Days per week: Not on file    Minutes per session: Not on file  . Stress: Not on file  Relationships  . Social connections:    Talks on phone: Not on file    Gets together: Not on file    Attends  religious service: Not on file    Active member of club or organization: Not on file    Attends meetings of clubs or organizations: Not on file    Relationship status: Not on file  . Intimate partner violence:    Fear of current or ex partner: Not on file    Emotionally abused: Not on file    Physically abused: Not on file    Forced sexual activity: Not on file  Other Topics Concern  . Not on file  Social History Narrative   Lives with husband in [place].    Family History  Problem Relation Age of Onset  . Heart attack Mother   . Hypertension Brother   . Kidney disease Brother   . Diabetes Brother   . Heart failure Maternal Grandfather     ROS- All systems are reviewed and negative except as per the HPI above  Physical Exam: Vitals:   05/10/17 1054  BP: 122/76  Pulse: 97  Weight: 168 lb (76.2 kg)  Height: 5' (1.524 m)   Wt Readings from Last 3 Encounters:  05/10/17 168 lb (76.2 kg)  04/25/17 167 lb (75.8 kg)  04/15/17 164 lb (74.4 kg)    Labs: Lab Results  Component Value Date   NA 135 04/11/2017   K 3.6 04/11/2017   CL 100 (L) 04/11/2017   CO2 25 04/11/2017   GLUCOSE 127 (H) 04/11/2017   BUN 7 04/11/2017   CREATININE 0.77 04/11/2017   CALCIUM 8.7 (L) 04/11/2017   MG 1.9 03/21/2017   Lab Results  Component Value Date   INR 1.04 09/18/2015   No results found for: CHOL, HDL, LDLCALC, TRIG   GEN- The patient is  well appearing, alert and oriented x 3 today.   Head- normocephalic, atraumatic Eyes-  Sclera clear, conjunctiva pink Ears- hearing intact Oropharynx- clear Neck- supple, no JVP Lymph- no cervical lymphadenopathy Lungs- Decreased breath sounds on left,  normal work of breathing Heart- irregular rate and rhythm, no murmurs, rubs or gallops, PMI not laterally displaced GI- soft, NT, ND, + BS Extremities- no clubbing, cyanosis, or edema MS- no significant deformity or atrophy Skin- no rash or lesion Psych- euthymic mood, full affect Neuro- strength and sensation are intact  EKG-atrial flutter with variable block,  IRBBB, qrs int 100 ms, qtc 513 ms, ST/T wave abnormality, consider inferior/anterolateral ischemia( will forward note/ekg findings to Dr. Oval Linsey for review of st changes) Epic records reviewed Echo-Study Conclusions  - Left ventricle: The cavity size was normal. There was mild   concentric hypertrophy. Systolic function was normal. The   estimated ejection fraction was in the range of 60% to 65%. Wall   motion was normal; there were no regional wall motion   abnormalities. Features are consistent with a pseudonormal left   ventricular filling pattern, with concomitant abnormal relaxation   and increased filling pressure (grade 2 diastolic dysfunction).   Doppler parameters are consistent with elevated ventricular   end-diastolic filling pressure. - Aortic valve: Trileaflet; mildly thickened leaflets. There was   mild regurgitation. - Aortic root: The aortic root was normal in size. - Mitral valve: Calcified annulus. Moderately thickened, moderately   calcified leaflets . The findings are consistent with moderate   stenosis. There was mild regurgitation. Mean gradient (D): 6 mm   Hg. Peak gradient (D): 12 mm Hg. - Left atrium: The atrium was severely dilated. - Right ventricle: Systolic function was normal. - Tricuspid valve: There was mild regurgitation. - Inferior  vena cava: The vessel was normal in size. - Pericardium, extracardiac: Features were not consistent with   tamponade physiology.  Impressions:  - LVEF 60-65%.   There was moderate lokalized pericardial effusion around the   inferior and inferolateral walls unchanged from prior exam on   10/22/15. No evidence for tamponade.   Mitral valve is thickened and calcified, appears rheumatic with   anterior leaflet bowing. There is moderate mitral stenosis and   midl regurgitation.   Assessment and Plan: 1. Persistent fib/flutter S/p ablation 03/10/17 Successful cardioversion  but ERAF in setting of URI Her URI is improved Discussed with Dr. Curt Bears  how next to approach to restore SR as she continues on flecainide 100 mg bid and he suggested to repeat cardioversion after URI resolved and pt had additional healing time from ablation, cardioversion scheduled for 4/22. Continue  eliquis 5 mg bid, no missed doses  2. Dyspnea May be multifactorial 2/2 to atrial flutter but will give pt lasix 20 mg daily a 3 days  F/u after cardioversion with Dr. Gaynelle Cage C. [facility] [place], [facility] [address] [place], [place] [address] [phone]

## 2017-05-13 ENCOUNTER — Ambulatory Visit (HOSPITAL_COMMUNITY): Payer: Medicare Other | Admitting: Nurse Practitioner

## 2017-05-13 ENCOUNTER — Telehealth: Payer: Self-pay | Admitting: Cardiology

## 2017-05-13 NOTE — Telephone Encounter (Signed)
Pt states she doesn't know what to do about DCCV Monday. States she is experiencing nausea/dizziness. She doesn't know if she can come for DCCV Monday. Pt scheduled to arrive to hospital at 8:30 that morning. Advised patient to call the office at 8am Monday morning if she is still experiencing above issues.  She understands we will assess then if she needs to reschedule procedure. She is agreeable to plan and thanks me for calling.

## 2017-05-13 NOTE — Telephone Encounter (Signed)
New message  Pt verbalzied that she is calling for RN  She has a cardioversion for Monday and she said that she is so   Dizzy and nauseated

## 2017-05-15 ENCOUNTER — Emergency Department (HOSPITAL_COMMUNITY): Payer: Medicare Other

## 2017-05-15 ENCOUNTER — Other Ambulatory Visit: Payer: Self-pay

## 2017-05-15 ENCOUNTER — Emergency Department (HOSPITAL_COMMUNITY)
Admission: EM | Admit: 2017-05-15 | Discharge: 2017-05-15 | Disposition: A | Discharge status: Home / Self Care | Payer: Medicare Other | Attending: Emergency Medicine | Admitting: Emergency Medicine

## 2017-05-15 ENCOUNTER — Encounter (HOSPITAL_COMMUNITY): Payer: Self-pay

## 2017-05-15 ENCOUNTER — Telehealth: Payer: Self-pay | Admitting: Physician Assistant

## 2017-05-15 DIAGNOSIS — J90 Pleural effusion, not elsewhere classified: Secondary | ICD-10-CM

## 2017-05-15 DIAGNOSIS — R109 Unspecified abdominal pain: Secondary | ICD-10-CM | POA: Diagnosis present

## 2017-05-15 DIAGNOSIS — Z7901 Long term (current) use of anticoagulants: Secondary | ICD-10-CM | POA: Diagnosis not present

## 2017-05-15 DIAGNOSIS — I3139 Other pericardial effusion (noninflammatory): Secondary | ICD-10-CM

## 2017-05-15 DIAGNOSIS — I313 Pericardial effusion (noninflammatory): Secondary | ICD-10-CM | POA: Diagnosis not present

## 2017-05-15 DIAGNOSIS — I10 Essential (primary) hypertension: Secondary | ICD-10-CM | POA: Diagnosis not present

## 2017-05-15 DIAGNOSIS — R0600 Dyspnea, unspecified: Secondary | ICD-10-CM | POA: Diagnosis not present

## 2017-05-15 DIAGNOSIS — R110 Nausea: Secondary | ICD-10-CM | POA: Diagnosis not present

## 2017-05-15 DIAGNOSIS — Z79899 Other long term (current) drug therapy: Secondary | ICD-10-CM | POA: Insufficient documentation

## 2017-05-15 LAB — URINALYSIS, ROUTINE W REFLEX MICROSCOPIC
BACTERIA UA: NONE SEEN
BILIRUBIN URINE: NEGATIVE
Glucose, UA: NEGATIVE mg/dL
KETONES UR: 5 mg/dL — AB
LEUKOCYTES UA: NEGATIVE
NITRITE: NEGATIVE
PH: 7 (ref 5.0–8.0)
Protein, ur: NEGATIVE mg/dL
Specific Gravity, Urine: 1.02 (ref 1.005–1.030)

## 2017-05-15 LAB — CBC
HCT: 41.8 % (ref 36.0–46.0)
HEMOGLOBIN: 13.1 g/dL (ref 12.0–15.0)
MCH: 30.2 pg (ref 26.0–34.0)
MCHC: 31.3 g/dL (ref 30.0–36.0)
MCV: 96.3 fL (ref 78.0–100.0)
Platelets: 206 10*3/uL (ref 150–400)
RBC: 4.34 MIL/uL (ref 3.87–5.11)
RDW: 15.3 % (ref 11.5–15.5)
WBC: 7.1 10*3/uL (ref 4.0–10.5)

## 2017-05-15 LAB — BASIC METABOLIC PANEL
Anion gap: 10 (ref 5–15)
BUN: 7 mg/dL (ref 6–20)
CHLORIDE: 101 mmol/L (ref 101–111)
CO2: 24 mmol/L (ref 22–32)
Calcium: 8.9 mg/dL (ref 8.9–10.3)
Creatinine, Ser: 0.82 mg/dL (ref 0.44–1.00)
GFR calc Af Amer: >60 mL/min (ref >60)
GFR calc non Af Amer: >60 mL/min (ref >60)
GLUCOSE: 97 mg/dL (ref 65–99)
POTASSIUM: 3.5 mmol/L (ref 3.5–5.1)
Sodium: 135 mmol/L (ref 135–145)

## 2017-05-15 LAB — I-STAT TROPONIN, ED
TROPONIN I, POC: 0 ng/mL (ref 0.00–0.08)
Troponin i, poc: 0 ng/mL (ref 0.00–0.08)

## 2017-05-15 LAB — BRAIN NATRIURETIC PEPTIDE: B NATRIURETIC PEPTIDE 5: 342.8 pg/mL — AB (ref 0.0–100.0)

## 2017-05-15 MED ORDER — ONDANSETRON HCL 4 MG/2ML IJ SOLN
4.0000 mg | Freq: Once | INTRAMUSCULAR | Status: AC
  Administered 2017-05-15: 4 mg via INTRAVENOUS
  Filled 2017-05-15: qty 2

## 2017-05-15 MED ORDER — METOCLOPRAMIDE HCL 5 MG/ML IJ SOLN
10.0000 mg | Freq: Once | INTRAMUSCULAR | Status: AC
  Administered 2017-05-15: 10 mg via INTRAVENOUS
  Filled 2017-05-15: qty 2

## 2017-05-15 MED ORDER — IOPAMIDOL (ISOVUE-300) INJECTION 61%
INTRAVENOUS | Status: DC
Start: 2017-05-15 — End: 2017-05-16
  Filled 2017-05-15: qty 100

## 2017-05-15 MED ORDER — SODIUM CHLORIDE 0.9 % IV BOLUS
1000.0000 mL | Freq: Once | INTRAVENOUS | Status: AC
  Administered 2017-05-15: 1000 mL via INTRAVENOUS

## 2017-05-15 MED ORDER — PROMETHAZINE HCL 25 MG PO TABS: 25.0000 mg | ORAL_TABLET | Freq: Four times a day (QID) | ORAL | 0 refills | Status: DC | PRN

## 2017-05-15 MED ORDER — IOPAMIDOL (ISOVUE-300) INJECTION 61%
100.0000 mL | Freq: Once | INTRAVENOUS | Status: AC | PRN
  Administered 2017-05-15: 100 mL via INTRAVENOUS

## 2017-05-15 NOTE — ED Provider Notes (Signed)
[facility] EMERGENCY DEPARTMENT Provider Note   CSN: [id number] Arrival date & time: 05/15/17  1407     History   Chief Complaint Chief Complaint  Patient presents with  . Abdominal Pain  . Chest Pain    HPI Mary Farrell is a 72 y.o. female.  HPI   72 year old female with history of atrial fibrillation who underwent cardiac ablation on February 14, hypertension, anxiety, valvular disease presenting complaining of nausea not feeling well.  Patient report for the past 6 days she has had persistent nausea, decrease in appetite, feeling dehydrated, nearly vomit several times, and 2 days ago she had several bouts of nonbloody non-mucousy diarrhea.  She also report intermittent bouts of dizziness with last bout 2 days ago at nighttime which she described more of a lightheadedness sensation and she did break out to sweat.  She reports occasional abdominal discomfort.  She mentioned having URI symptoms for the past month which she takes over-the-counter medication for.  Also report history of atrial fibrillation requiring cardioversion.  States that she is scheduled for cardioversion procedure tomorrow. She is here today due to not feeling well.  She does not report any actual chest pain, and states "maybe a few twinge in my chest here and there" but none currently.  No report of fever, productive cough, dysuria, constipation, or rash.  Past Medical History:  Diagnosis Date  . Aortic regurgitation 08/13/2015  . Arthritis    "hands, wrists, back, feet, toes" (09/18/2015)  . Atypical chest pain 05/13/2016  . Chest pain    remote cath in 1996 with NORMAL coronaries noted  . Exogenous obesity    history of   . GERD (gastroesophageal reflux disease)   . Glaucoma, both eyes   . Headache, migraine    "stopped in my 50's" (09/18/2015)  . History of cardiovascular stress test 2007   showing no ischemia  . Hypertension   . Mitral stenosis with insufficiency   . Murmur    of  mitral stenosis and moderate aortic insufficiency      . Palpitations    occasional  . Personal history of rheumatic heart disease   . SBE (subacute bacterial endocarditis)    prophylaxis  . Sliding hiatal hernia     Patient Active Problem List   Diagnosis Date Noted  . Persistent atrial fibrillation ([place]) 03/10/2017  . Rash 06/08/2016  . Atypical chest pain 05/13/2016  . Atrial flutter with controlled response ([place]) 09/18/2015  . Pericardial effusion 09/18/2015  . Aortic regurgitation 08/13/2015  . Essential hypertension 09/05/2013  . Anxiety 06/29/2012  . GERD (gastroesophageal reflux disease) 03/11/2011  . Dyspnea 10/30/2010  . Mitral stenosis and aortic insufficiency 06/23/2010    Past Surgical History:  Procedure Laterality Date  . ABDOMINAL HYSTERECTOMY  1975  . APPENDECTOMY  1977  . ATRIAL FIBRILLATION ABLATION N/A 03/10/2017   Procedure: ATRIAL FIBRILLATION ABLATION;  Surgeon: [name], MD;  Location: [facility] CV LAB;  Service: Cardiovascular;  Laterality: N/A;  . BUNIONECTOMY Right 2000s  . CARDIAC CATHETERIZATION  01/13/1995   normal coronary anatomy and mild mitral stenosis and mild pulmonary hypertension  . CARDIOVERSION N/A 09/22/2015   Procedure: CARDIOVERSION;  Surgeon: Jerline Pain, MD;  Location: [facility] ENDOSCOPY;  Service: Cardiovascular;  Laterality: N/A;  . CARDIOVERSION N/A 10/25/2016   Procedure: CARDIOVERSION;  Surgeon: Fay Records, MD;  Location: [facility] ENDOSCOPY;  Service: Cardiovascular;  Laterality: N/A;  . CARDIOVERSION N/A 04/15/2017   Procedure: CARDIOVERSION;  Surgeon:  Josue Hector, MD;  Location: [facility] ENDOSCOPY;  Service: Cardiovascular;  Laterality: N/A;  . CATARACT EXTRACTION W/ INTRAOCULAR LENS  IMPLANT, BILATERAL Bilateral 2013  . LAPAROSCOPIC CHOLECYSTECTOMY  1997  . TONSILLECTOMY AND ADENOIDECTOMY  1990s  . UVULOPALATOPHARYNGOPLASTY, TONSILLECTOMY AND SEPTOPLASTY  1990s     OB History   None      Home Medications     Prior to Admission medications   Medication Sig Start Date End Date Taking? Authorizing Provider  acetaminophen (TYLENOL) 500 MG tablet Take 500 mg by mouth every 6 (six) hours as needed for moderate pain or headache.    [provider]  ALPRAZolam Duanne Moron) 0.25 MG tablet Take 0.125 mg by mouth 2 (two) times daily as needed for anxiety.     [provider]  B Complex Vitamins (VITAMIN B COMPLEX PO) Take 1 capsule by mouth daily.      [provider]  benzonatate (TESSALON) 200 MG capsule Take 200 mg by mouth 3 (three) times daily as needed for cough. 04/28/17   [provider]  carvedilol (COREG) 6.25 MG tablet TAKE 1 TABLET BY MOUTH TWO  TIMES DAILY Patient taking differently: Take 6.25 mg by mouth twice daily 02/16/17   Skeet Latch, MD  Cholecalciferol (VITAMIN D) 2000 UNITS CAPS Take 2,000 Units by mouth daily.      [provider]  conjugated estrogens (PREMARIN) vaginal cream Place 1 Applicatorful vaginally 3 (three) times a week.     [provider]  Docusate Calcium (STOOL SOFTENER PO) Take 1 capsule by mouth 2 (two) times daily as needed (for constipation).     [provider]  dorzolamide-timolol (COSOPT) 22.3-6.8 MG/ML ophthalmic solution PLACE 1 DROP INTO EACH EYE DAILY 04/12/15   [provider]  doxepin (SINEQUAN) 25 MG capsule Take 25 mg by mouth at bedtime. 11/09/16   [provider]  ELIQUIS 5 MG TABS tablet TAKE 1 TABLET BY MOUTH TWO  TIMES DAILY Patient taking differently: TAKE 5 MG BY MOUTH TWO TIMES DAILY 12/03/16   Skeet Latch, MD  flecainide (TAMBOCOR) 50 MG tablet Take 2 tablets (100 mg total) by mouth 2 (two) times daily. 03/11/17   Baldwin Jamaica, PA-C  fluticasone (FLONASE) 50 MCG/ACT nasal spray Place 1 spray into both nostrils 2 (two) times daily. 05/03/17   [provider]  furosemide (LASIX) 20 MG tablet Take 1 tablet daily for 3 days then as needed for weight gain  only Patient taking differently: Take 20 mg by mouth daily as needed for fluid.  05/10/17   Sherran Needs, NP  gabapentin (NEURONTIN) 300 MG capsule Take 300 mg by mouth 2 (two) times daily.     [provider]  guaiFENesin (MUCINEX) 600 MG 12 hr tablet Take 600 mg by mouth 2 (two) times daily.     [provider]  hydrochlorothiazide (MICROZIDE) 12.5 MG capsule TAKE 1 CAPSULE BY MOUTH  DAILY Patient taking differently: Take 12.5 mg by mouth once daily 12/02/16   Skeet Latch, MD  latanoprost (XALATAN) 0.005 % ophthalmic solution Place 1 drop into both eyes at bedtime.  06/17/14   [provider]  losartan (COZAAR) 100 MG tablet TAKE 1 TABLET BY MOUTH  DAILY Patient taking differently: TAKE 100 MG BY MOUTH  DAILY 04/19/17   Skeet Latch, MD  multivitamin [facility]) per tablet Take 1 tablet by mouth daily.      [provider]  NON FORMULARY Apply 1 application topically 2 (two) times daily  as needed (for eczema). Traimcinolone/[employer] Moist Cream    [provider]  omeprazole (PRILOSEC) 40 MG capsule Take 40 mg by mouth daily.     [provider]  ondansetron (ZOFRAN) 4 MG tablet Take 1 tablet (4 mg total) by mouth daily as needed for nausea or vomiting. 04/16/17 04/16/18  Erlene Quan, PA-C  oxybutynin (DITROPAN-XL) 10 MG 24 hr tablet Take 10 mg by mouth daily as needed (for bladder).  12/16/15   [provider]    Family History Family History  Problem Relation Age of Onset  . Heart attack Mother   . Hypertension Brother   . Kidney disease Brother   . Diabetes Brother   . Heart failure Maternal Grandfather     Social History Social History   Tobacco Use  . Smoking status: Never Smoker  . Smokeless tobacco: Never Used  Substance Use Topics  . Alcohol use: No  . Drug use: No     Allergies   Metoprolol and Oxaprozin   Review of Systems Review of Systems  All other systems reviewed and are  negative.    Physical Exam Updated Vital Signs BP (!) 152/84 (BP Location: Right Arm)   Pulse (!) 109   Temp 97.8 F (36.6 C) (Oral)   Resp 18   Ht 5' (1.524 m)   Wt 73.9 kg (163 lb)   SpO2 95%   BMI 31.83 kg/m   Physical Exam  Constitutional: She appears well-developed and well-nourished. No distress.  HENT:  Head: Atraumatic.  Eyes: Conjunctivae are normal.  Neck: Neck supple.  Cardiovascular:  Irregularly irregular heart rhythm with systolic murmur  Pulmonary/Chest: Effort normal and breath sounds normal. No respiratory distress.  Abdominal: Soft. Normal appearance and bowel sounds are normal. There is no tenderness.  No focal abdominal tenderness.  Mild epigastric discomfort.  Neurological: She is alert.  Skin: No rash noted.  Psychiatric: She has a normal mood and affect.  Nursing note and vitals reviewed.    ED Treatments / Results  Labs (all labs ordered are listed, but only abnormal results are displayed) Labs Reviewed  URINALYSIS, ROUTINE W REFLEX MICROSCOPIC - Abnormal; Notable for the following components:      Result Value   Color, Urine STRAW (*)    Hgb urine dipstick MODERATE (*)    Ketones, ur 5 (*)    Squamous Epithelial / LPF 0-5 (*)    All other components within normal limits  BRAIN NATRIURETIC PEPTIDE - Abnormal; Notable for the following components:   B Natriuretic Peptide 342.8 (*)    All other components within normal limits  BASIC METABOLIC PANEL  CBC  I-STAT TROPONIN, ED  I-STAT TROPONIN, ED    EKG EKG Interpretation  Date/Time:  Sunday May 15 2017 14:53:42 EDT Ventricular Rate:  111 PR Interval:    QRS Duration: 104 QT Interval:  376 QTC Calculation: 511 R Axis:   115 Text Interpretation:  atrial flutter with RVR Right axis deviation Incomplete right bundle branch block Septal infarct , age undetermined ST & T wave abnormality, consider inferior ischemia ST & T wave abnormality, consider anterolateral ischemia Abnormal ECG  no significant change since May 10 2017 Confirmed by Sherwood Gambler [phone]) on 05/15/2017 5:16:10 PM   Radiology Dg Chest 2 View  Result Date: 05/15/2017 CLINICAL DATA:  Upper respiratory infection. Chest pain. Scheduled for cardioversion. EXAM: CHEST - 2 VIEW COMPARISON:  04/25/2017. FINDINGS: Stable enlarged cardiac silhouette. No significant change in patchy and linear density  in the left lower lung zone. Interval small amount of patchy and linear density at the right lateral lung base. There is also a small posterior pleural effusion, not previously present. Mild scoliosis. Cholecystectomy clips. IMPRESSION: 1. Interval small posterior pleural effusion. 2. No significant change in atelectasis/scarring in the left lower lung zone. 3. Interval small amount of atelectasis at the right lateral lung base. 4. Stable cardiomegaly with normal vasculature. Electronically Signed   By: Claudie Revering M.D.   On: 05/15/2017 15:25    Procedures Procedures (including critical care time)  Medications Ordered in ED Medications  iopamidol (ISOVUE-300) 61 % injection (has no administration in time range)  sodium chloride 0.9 % bolus 1,000 mL (0 mLs Intravenous Stopped 05/15/17 1912)  ondansetron (ZOFRAN) injection 4 mg (4 mg Intravenous Given 05/15/17 1813)  iopamidol (ISOVUE-300) 61 % injection 100 mL (100 mLs Intravenous Contrast Given 05/15/17 1909)  metoCLOPramide (REGLAN) injection 10 mg (10 mg Intravenous Given 05/15/17 2115)     Initial Impression / Assessment and Plan / ED Course  I have reviewed the triage vital signs and the nursing notes.  Pertinent labs & imaging results that were available during my care of the patient were reviewed by me and considered in my medical decision making (see chart for details).     BP (!) 152/106   Pulse (!) 110   Temp 97.8 F (36.6 C) (Oral)   Resp 17   Ht 5' (1.524 m)   Wt 73.9 kg (163 lb)   SpO2 97%   BMI 31.83 kg/m    Final Clinical Impressions(s)  / ED Diagnoses   Final diagnoses:  Nausea  Pleural effusion  Pericardial effusion    ED Discharge Orders        Ordered    promethazine (PHENERGAN) 25 MG tablet  Every 6 hours PRN     05/15/17 2220     5:56 PM Patient is here due to persistent nausea and feeling dehydrated.  States she had decreased p.o. intake.  She does occasionally endorse some mild vague chest discomfort none currently as well as vague abdominal discomfort..  She is getting over an upper respiratory infection.  She is scheduled to have cardioversion of her A. fib tomorrow by Dr. Debara Pickett.  She felt that she is unable to wait any longer.  Workup initiated.  8:48 PM EKG without acute ischemic changes, normal delta troponin, labs are reassuring without evidence of significant dehydration.  EKG does demonstrate atrial flutter, mildly tachycardic.  Initial chest x-ray shows interval small posterior pleural effusion.  CT scan demonstrate pleural effusion, stable cardiac enlargement and pericardial effusion.  No acute abdominal pathology.  Patient received Zofran and IV fluid but still endorse nausea.  Reglan given.  Will consult cardiology for recommendation of further management.  Care discussed with Dr. Ardith Dark.  9:03 PM Appreciate consultation from cardiologist Dr. Hassell Done who felt pt is stable on a cardiac stand point.  She should have her cardioversion tomorrow as scheduled.  Her labs are reassuring, BNP have not resulted yet.    10:23 PM BNP 342.8.  Recommend pt to increase her Lasix at home, and to f/u with PCP for further management.  She will need to f/u with Cardiology tomorrow for cardioversion.  Return precaution given.  Phenergan given for nausea    Domenic Moras, PA-C 05/15/17 2224    Sherwood Gambler, MD 05/17/17 1004

## 2017-05-15 NOTE — ED Notes (Signed)
Patient reports nausea and "dry heaves"

## 2017-05-15 NOTE — ED Notes (Signed)
In and out cath not necessary as patient is in the bathroom voiding at this time.

## 2017-05-15 NOTE — ED Triage Notes (Signed)
Pt states that she had an ablation on 2-14 for AFib, was cardioverted on 3-22 and states she has been feeling bad ever since and had URI. Husband at bedside states he has noticed discoloration around her lips at times. Pt reports she is scheduled for a cardioversion tomorrow "but could not wait because of pressure and nausea".

## 2017-05-15 NOTE — Telephone Encounter (Signed)
Pt called stating that over the past week she has had nausea and has had very poor PO intake. She is not able to come for cardioversion as scheduled tomorrow morning. I advised her that if she is severely dehydrated, she may need to go to the ER for IV hydration. She expressed understanding of the plan. She states her heart rate is 103.

## 2017-05-15 NOTE — Discharge Instructions (Signed)
Please alternate between zofran and phenergan for your nausea.  You are a bit fluid overload.  Please double up on your Lasix for the next few days to help with that.  Follow up with Dr. Debara Pickett tomorrow for cardioversion as plan unless if you do not feel well please contact your cardiologist for further management.  Return if you have any concerns.

## 2017-05-15 NOTE — ED Notes (Signed)
Patient transported to CT 

## 2017-05-16 ENCOUNTER — Encounter (HOSPITAL_COMMUNITY)
Admission: RE | Disposition: A | Discharge status: Home / Self Care | Payer: Self-pay | Source: Ambulatory Visit | Attending: Internal Medicine

## 2017-05-16 ENCOUNTER — Ambulatory Visit (HOSPITAL_COMMUNITY): Payer: Medicare Other | Admitting: Certified Registered"

## 2017-05-16 ENCOUNTER — Ambulatory Visit (HOSPITAL_COMMUNITY)
Admission: RE | Admit: 2017-05-16 | Discharge: 2017-05-16 | Disposition: A | Discharge status: Home / Self Care | Payer: Medicare Other | Source: Ambulatory Visit | Attending: Internal Medicine | Admitting: Internal Medicine

## 2017-05-16 ENCOUNTER — Encounter (HOSPITAL_COMMUNITY): Payer: Self-pay

## 2017-05-16 DIAGNOSIS — I4892 Unspecified atrial flutter: Secondary | ICD-10-CM | POA: Diagnosis not present

## 2017-05-16 DIAGNOSIS — Z6831 Body mass index (BMI) 31.0-31.9, adult: Secondary | ICD-10-CM | POA: Insufficient documentation

## 2017-05-16 DIAGNOSIS — Z90710 Acquired absence of both cervix and uterus: Secondary | ICD-10-CM | POA: Insufficient documentation

## 2017-05-16 DIAGNOSIS — F419 Anxiety disorder, unspecified: Secondary | ICD-10-CM | POA: Insufficient documentation

## 2017-05-16 DIAGNOSIS — Z9889 Other specified postprocedural states: Secondary | ICD-10-CM | POA: Diagnosis not present

## 2017-05-16 DIAGNOSIS — E669 Obesity, unspecified: Secondary | ICD-10-CM | POA: Insufficient documentation

## 2017-05-16 DIAGNOSIS — M1990 Unspecified osteoarthritis, unspecified site: Secondary | ICD-10-CM | POA: Insufficient documentation

## 2017-05-16 DIAGNOSIS — I10 Essential (primary) hypertension: Secondary | ICD-10-CM | POA: Insufficient documentation

## 2017-05-16 DIAGNOSIS — K219 Gastro-esophageal reflux disease without esophagitis: Secondary | ICD-10-CM | POA: Diagnosis not present

## 2017-05-16 DIAGNOSIS — H409 Unspecified glaucoma: Secondary | ICD-10-CM | POA: Insufficient documentation

## 2017-05-16 DIAGNOSIS — Z79899 Other long term (current) drug therapy: Secondary | ICD-10-CM | POA: Diagnosis not present

## 2017-05-16 HISTORY — PX: CARDIOVERSION: SHX1299

## 2017-05-16 SURGERY — CARDIOVERSION
Anesthesia: General

## 2017-05-16 MED ORDER — SODIUM CHLORIDE 0.9 % IV SOLN
INTRAVENOUS | Status: DC | PRN
  Administered 2017-05-16: 10:00:00 via INTRAVENOUS

## 2017-05-16 MED ORDER — PROPOFOL 10 MG/ML IV BOLUS
INTRAVENOUS | Status: DC | PRN
  Administered 2017-05-16: 60 mg via INTRAVENOUS

## 2017-05-16 MED ORDER — LIDOCAINE 2% (20 MG/ML) 5 ML SYRINGE
INTRAMUSCULAR | Status: DC | PRN
  Administered 2017-05-16: 20 mg via INTRAVENOUS

## 2017-05-16 NOTE — H&P (Signed)
   INTERVAL PROCEDURE H&P  History and Physical Interval Note:  05/16/2017 8:52 AM  Mary Farrell has presented today for their planned procedure. The various methods of treatment have been discussed with the patient and family. After consideration of risks, benefits and other options for treatment, the patient has consented to the procedure.  The patients' outpatient history has been reviewed, patient examined, and no change in status from most recent office note within the past 30 days. I have reviewed the patients' chart and labs and will proceed as planned. Questions were answered to the patient's satisfaction.   Pixie Casino, MD, [facility], [place] Director of the Advanced Lipid Disorders &  Cardiovascular Risk Reduction Clinic Diplomate of the American Board of Clinical Lipidology Attending Cardiologist  Direct Dial: [phone]  Fax: [phone]  Website:  [email or web address] 05/16/2017, 8:52 AM

## 2017-05-16 NOTE — Anesthesia Procedure Notes (Signed)
Procedure Name: General with mask airway Date/Time: 05/16/2017 10:12 AM Performed by: Imagene Riches, CRNA Pre-anesthesia Checklist: Patient identified, Emergency Drugs available, Suction available and Patient being monitored Patient Re-evaluated:Patient Re-evaluated prior to induction Oxygen Delivery Method: Ambu bag Preoxygenation: Pre-oxygenation with 100% oxygen Induction Type: IV induction

## 2017-05-16 NOTE — CV Procedure (Signed)
   CARDIOVERSION NOTE  Procedure: Electrical Cardioversion Indications:  Atrial Flutter  Procedure Details:  Consent: Risks of procedure as well as the alternatives and risks of each were explained to the (patient/caregiver).  Consent for procedure obtained.  Time Out: Verified patient identification, verified procedure, site/side was marked, verified correct patient position, special equipment/implants available, medications/allergies/relevent history reviewed, required imaging and test results available.  Performed  Patient placed on cardiac monitor, pulse oximetry, supplemental oxygen as necessary.  Sedation given: propofol per anesthesia Pacer pads placed anterior and posterior chest.  Cardioverted 1 time(s).  Cardioverted at 120J biphasic.  Impression: Findings: Post procedure EKG shows: NSR Complications: None Patient did tolerate procedure well.  Plan: 1. Successful DCCV to NSR with a single 120J biphasic shock.  Time Spent Directly with the Patient:  30 minutes   Pixie Casino, MD, [facility], [place] Director of the Advanced Lipid Disorders &  Cardiovascular Risk Reduction Clinic Diplomate of the American Board of Clinical Lipidology Attending Cardiologist  Direct Dial: [phone]  Fax: [phone]  Website:  [email or web address] 05/16/2017, 10:17 AM

## 2017-05-16 NOTE — Discharge Instructions (Signed)
Electrical Cardioversion, Care After °This sheet gives you information about how to care for yourself after your procedure. Your health care provider may also give you more specific instructions. If you have problems or questions, contact your health care provider. °What can I expect after the procedure? °After the procedure, it is common to have: °· Some redness on the skin where the shocks were given. ° °Follow these instructions at home: °· Do not drive for 24 hours if you were given a medicine to help you relax (sedative). °· Take over-the-counter and prescription medicines only as told by your health care provider. °· Ask your health care provider how to check your pulse. Check it often. °· Rest for 48 hours after the procedure or as told by your health care provider. °· Avoid or limit your caffeine use as told by your health care provider. °Contact a health care provider if: °· You feel like your heart is beating too quickly or your pulse is not regular. °· You have a serious muscle cramp that does not go away. °Get help right away if: °· You have discomfort in your chest. °· You are dizzy or you feel faint. °· You have trouble breathing or you are short of breath. °· Your speech is slurred. °· You have trouble moving an arm or leg on one side of your body. °· Your fingers or toes turn cold or blue. °This information is not intended to replace advice given to you by your health care provider. Make sure you discuss any questions you have with your health care provider. °Document Released: 11/01/2012 Document Revised: 08/15/2015 Document Reviewed: 07/18/2015 °Elsevier Interactive Patient Education © 2018 [employer] ° °

## 2017-05-16 NOTE — Transfer of Care (Signed)
Immediate Anesthesia Transfer of Care Note  Patient: Mary Farrell  Procedure(s) Performed: CARDIOVERSION (N/A )  Patient Location: Endoscopy Unit  Anesthesia Type:General  Level of Consciousness: drowsy  Airway & Oxygen Therapy: Patient Spontanous Breathing  Post-op Assessment: Report given to RN and Post -op Vital signs reviewed and stable  Post vital signs: Reviewed and stable  Last Vitals:  Vitals Value Taken Time  BP    Temp    Pulse    Resp    SpO2      Last Pain:  Vitals:   05/16/17 0859  TempSrc: Oral  PainSc: 0-No pain         Complications: No apparent anesthesia complications

## 2017-05-16 NOTE — Anesthesia Postprocedure Evaluation (Signed)
Anesthesia Post Note  Patient: Mary Farrell  Procedure(s) Performed: CARDIOVERSION (N/A )     Patient location during evaluation: PACU Anesthesia Type: General Level of consciousness: awake and alert Pain management: pain level controlled Vital Signs Assessment: post-procedure vital signs reviewed and stable Respiratory status: spontaneous breathing, nonlabored ventilation, respiratory function stable and patient connected to nasal cannula oxygen Cardiovascular status: blood pressure returned to baseline and stable Postop Assessment: no apparent nausea or vomiting Anesthetic complications: no    Last Vitals:  Vitals:   05/16/17 1025 05/16/17 1035  BP: 126/64 130/85  Pulse: 65 66  Resp: 19 18  Temp:    SpO2: 96% 97%    Last Pain:  Vitals:   05/16/17 1035  TempSrc:   PainSc: 0-No pain                 Tiajuana Amass

## 2017-05-16 NOTE — Anesthesia Preprocedure Evaluation (Signed)
Anesthesia Evaluation  Patient identified by MRN, date of birth, ID band Patient awake    Reviewed: Allergy & Precautions, NPO status , Patient's Chart, lab work & pertinent test results  Airway Mallampati: III  TM Distance: >3 FB Neck ROM: Full    Dental   Pulmonary neg pulmonary ROS,    breath sounds clear to auscultation       Cardiovascular hypertension, Pt. on medications  Rhythm:Irregular Rate:Normal  LVEF 60-65%.  There was moderate lokalized pericardial effusion around the  inferior and inferolateral walls unchanged from prior exam on  10/22/15. No evidence for tamponade.  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 There is moderate mitral stenosis and  midl regurgitation.   Neuro/Psych  Headaches, Anxiety  Neuromuscular disease    GI/Hepatic Neg liver ROS, hiatal hernia, GERD  ,  Endo/Other  negative endocrine ROS  Renal/GU negative Renal ROS     Musculoskeletal  (+) Arthritis ,   Abdominal   Peds  Hematology negative hematology ROS (+)   Anesthesia Other Findings   Reproductive/Obstetrics                             Anesthesia Physical Anesthesia Plan  ASA: III  Anesthesia Plan: General   Post-op Pain Management:    Induction: Intravenous  PONV Risk Score and Plan: 3 and Treatment may vary due to age or medical condition  Airway Management Planned: Mask and Natural Airway  Additional Equipment:   Intra-op Plan:   Post-operative Plan:   Informed Consent: I have reviewed the patients History and Physical, chart, labs and discussed the procedure including the risks, benefits and alternatives for the proposed anesthesia with the patient or authorized representative who has indicated his/her understanding and acceptance.     Plan Discussed with:   Anesthesia Plan Comments:         Anesthesia Quick Evaluation

## 2017-05-24 ENCOUNTER — Encounter: Payer: Self-pay | Admitting: Cardiology

## 2017-05-24 ENCOUNTER — Ambulatory Visit (INDEPENDENT_AMBULATORY_CARE_PROVIDER_SITE_OTHER): Payer: Medicare Other | Admitting: Cardiology

## 2017-05-24 VITALS — BP 138/80 | HR 66 | Ht 60.0 in | Wt 159.6 lb

## 2017-05-24 DIAGNOSIS — I481 Persistent atrial fibrillation: Secondary | ICD-10-CM | POA: Diagnosis not present

## 2017-05-24 DIAGNOSIS — I10 Essential (primary) hypertension: Secondary | ICD-10-CM | POA: Diagnosis not present

## 2017-05-24 DIAGNOSIS — I4819 Other persistent atrial fibrillation: Secondary | ICD-10-CM

## 2017-05-24 DIAGNOSIS — I313 Pericardial effusion (noninflammatory): Secondary | ICD-10-CM

## 2017-05-24 DIAGNOSIS — I483 Typical atrial flutter: Secondary | ICD-10-CM | POA: Diagnosis not present

## 2017-05-24 DIAGNOSIS — I3139 Other pericardial effusion (noninflammatory): Secondary | ICD-10-CM

## 2017-05-24 NOTE — Progress Notes (Signed)
Electrophysiology Office Note   Date:  05/24/2017   ID:  Mary, Farrell [birth year], MRN [id number]  PCP:  Lawerance Cruel, MD  Cardiologist:  Oval Linsey Primary Electrophysiologist:  Cloey Sferrazza Meredith Leeds, MD    Chief Complaint  Patient presents with  . Follow-up    Paroxysmal artial fibrillation     History of Present Illness: Mary Farrell is a 72 y.o. female who is being seen today for the evaluation of atrial fibrillation at the request of Lawerance Cruel, MD. Presenting today for electrophysiology evaluation. She has a history of moderate aortic regurgitation, mild to moderate mitral stenosis, chronic pericardial effusion, paroxysmal atrial flutter, and hypertension. She had a heart catheterization in 1996 revealed normal coronaries with mild MS and mild pulmonary hypertension.  She was admitted 09/14/2015 with atrial flutter. She was started on amiodarone but did not tolerate it due to nausea.  He has had multiple cardioversions in the past.  She had an atrial fibrillation/flutter ablation 03/10/2017.  Today, denies symptoms of palpitations, chest pain, shortness of breath, orthopnea, PND, lower extremity edema, claudication, dizziness, presyncope, syncope, bleeding, or neurologic sequela. The patient is tolerating medications without difficulties.  She is feeling well today.  She  went into atrial fibrillation approximately 1 month ago.  This was in the setting of a GI and respiratory illness.  She did require cardioversion.  She is continued her 100 mg of flecainide.  She is feeling much improved, though she does continue to have nausea.  She has some swelling in her legs which has been improved with as needed Lasix.   Past Medical History:  Diagnosis Date  . Aortic regurgitation 08/13/2015  . Arthritis    "hands, wrists, back, feet, toes" (09/18/2015)  . Atypical chest pain 05/13/2016  . Chest pain    remote cath in 1996 with NORMAL coronaries noted  . Exogenous obesity    history of   . GERD (gastroesophageal reflux disease)   . Glaucoma, both eyes   . Headache, migraine    "stopped in my 50's" (09/18/2015)  . History of cardiovascular stress test 2007   showing no ischemia  . Hypertension   . Mitral stenosis with insufficiency   . Murmur    of mitral stenosis and moderate aortic insufficiency      . Palpitations    occasional  . Personal history of rheumatic heart disease   . SBE (subacute bacterial endocarditis)    prophylaxis  . Sliding hiatal hernia    Past Surgical History:  Procedure Laterality Date  . ABDOMINAL HYSTERECTOMY  1975  . APPENDECTOMY  1977  . ATRIAL FIBRILLATION ABLATION N/A 03/10/2017   Procedure: ATRIAL FIBRILLATION ABLATION;  Surgeon: [name], MD;  Location: [place] [facility];  Service: Cardiovascular;  Laterality: N/A;  . BUNIONECTOMY Right 2000s  . CARDIAC CATHETERIZATION  01/13/1995   normal coronary anatomy and mild mitral stenosis and mild pulmonary hypertension  . CARDIOVERSION N/A 09/22/2015   Procedure: CARDIOVERSION;  Surgeon: Jerline Pain, MD;  Location: [facility] ENDOS[facility];  Service: Cardiovascular;  Laterality: N/A;  . CARDIOVERSION N/A 10/25/2016   Procedure: CARDIOVERSION;  Surgeon: Fay Records, MD;  Location: [facility] ENDOSCOPY;  Service: Cardiovascular;  Laterality: N/A;  . CARDIOVERSION N/A 04/15/2017   Procedure: CARDIOVERSION;  Surgeon: Josue Hector, MD;  Location: [facility] ENDOSCOPY;  Service: Cardiovascular;  Laterality: N/A;  . CARDIOVERSION N/A 05/16/2017   Procedure: CARDIOVERSION;  Surgeon: Pixie Casino, MD;  Location: [place];  Service:  Cardiovascular;  Laterality: N/A;  . CATARACT EXTRACTION W/ INTRAOCULAR LENS  IMPLANT, BILATERAL Bilateral 2013  . LAPAROSCOPIC CHOLECYSTECTOMY  1997  . TONSILLECTOMY AND ADENOIDECTOMY  1990s  . UVULOPALATOPHARYNGOPLASTY, TONSILLECTOMY AND SEPTOPLASTY  1990s     Current Outpatient Medications  Medication Sig Dispense Refill  . acetaminophen (TYLENOL) 500 MG  tablet Take 500 mg by mouth every 6 (six) hours as needed for moderate pain or headache.    . ALPRAZolam (XANAX) 0.25 MG tablet Take 0.125 mg by mouth 2 (two) times daily as needed for anxiety.     . B Complex Vitamins (VITAMIN B COMPLEX PO) Take 1 capsule by mouth at bedtime.     . benzonatate (TESSALON) 200 MG capsule Take 200 mg by mouth 3 (three) times daily as needed for cough.  0  . carvedilol (COREG) 6.25 MG tablet Take 6.25 mg by mouth 2 (two) times daily with a meal.    . Cholecalciferol (VITAMIN D) 2000 UNITS CAPS Take 2,000 Units by mouth daily.      Marland Kitchen conjugated estrogens (PREMARIN) vaginal cream Place 1 Applicatorful vaginally daily as needed (irritation).     Mariane Baumgarten Calcium (STOOL SOFTENER PO) Take 1 capsule by mouth 2 (two) times daily as needed (for constipation).     . dorzolamide-timolol (COSOPT) 22.3-6.8 MG/ML ophthalmic solution Place 1 drop into both eyes daily.    Marland Kitchen doxepin (SINEQUAN) 25 MG capsule Take 25 mg by mouth at bedtime.  8  . ELIQUIS 5 MG TABS tablet TAKE 1 TABLET BY MOUTH TWO  TIMES DAILY (Patient taking differently: TAKE 1 TABLET BY MOUTH TWO TIMES DAILY) 180 tablet 1  . flecainide (TAMBOCOR) 50 MG tablet Take 2 tablets (100 mg total) by mouth 2 (two) times daily. 120 tablet 3  . fluticasone (FLONASE) 50 MCG/ACT nasal spray Place 1 spray into both nostrils 2 (two) times daily as needed for allergies or rhinitis.   3  . furosemide (LASIX) 20 MG tablet Take 1 tablet daily for 3 days then as needed for weight gain only (Patient taking differently: Take 20 mg by mouth daily as needed for fluid or edema (weight gain). ) 15 tablet 1  . gabapentin (NEURONTIN) 300 MG capsule Take 300 mg by mouth 2 (two) times daily.     Marland Kitchen guaiFENesin (MUCINEX) 600 MG 12 hr tablet Take 600 mg by mouth 2 (two) times daily.     . hydrochlorothiazide (MICROZIDE) 12.5 MG capsule TAKE 1 CAPSULE BY MOUTH  DAILY (Patient taking differently: TAKE 1 CAPSULE BY MOUTH DAILY) 90 capsule 3  .  latanoprost (XALATAN) 0.005 % ophthalmic solution Place 1 drop into both eyes at bedtime.   6  . losartan (COZAAR) 100 MG tablet TAKE 1 TABLET BY MOUTH  DAILY (Patient taking differently: TAKE 1 TABLET BY MOUTH DAILY) 90 tablet 0  . Multiple Vitamin (MULTIVITAMIN WITH MINERALS) TABS tablet Take 1 tablet by mouth daily.    . NON FORMULARY Apply 1 application topically 2 (two) times daily as needed (for eczema). Traimcinolone/[employer] Moist Cream    . omeprazole (PRILOSEC) 40 MG capsule Take 40 mg by mouth daily.     . ondansetron (ZOFRAN) 4 MG tablet Take 1 tablet (4 mg total) by mouth daily as needed for nausea or vomiting. (Patient taking differently: Take 4 mg by mouth every 8 (eight) hours as needed for nausea or vomiting. ) 5 tablet 0  . oxybutynin (DITROPAN-XL) 10 MG 24 hr tablet Take 10 mg by mouth daily as needed (  frequent urination).     Marland Kitchen PRESCRIPTION MEDICATION Inhale into the lungs at bedtime. CPAP    . promethazine (PHENERGAN) 25 MG tablet Take 1 tablet (25 mg total) by mouth every 6 (six) hours as needed for nausea. 20 tablet 0   No current facility-administered medications for this visit.     Allergies:   Metoprolol and Oxaprozin   Social History:  The patient  reports that she has never smoked. She has never used smokeless tobacco. She reports that she does not drink alcohol or use drugs.   Family History:  The patient's family history includes Diabetes in her brother; Heart attack in her mother; Heart failure in her maternal grandfather; Hypertension in her brother; Kidney disease in her brother.    ROS:  Please see the history of present illness.   Otherwise, review of systems is positive for shortness of breath, leg swelling, constipation, nausea.   All other systems are reviewed and negative.   PHYSICAL EXAM: VS:  BP 138/80   Pulse 66   Ht 5' (1.524 m)   Wt 159 lb 9.6 oz (72.4 kg)   SpO2 96%   BMI 31.17 kg/m  , BMI Body mass index is 31.17 kg/m. GEN: Well nourished, well  developed, in no acute distress  HEENT: normal  Neck: no JVD, carotid bruits, or masses Cardiac: RRR; no murmurs, rubs, or gallops,no edema  Respiratory:  clear to auscultation bilaterally, normal work of breathing GI: soft, nontender, nondistended, + BS MS: no deformity or atrophy  Skin: warm and dry Neuro:  Strength and sensation are intact Psych: euthymic mood, full affect  EKG:  EKG is ordered today. Personal review of the ekg ordered shows SR, rate 66, RAD, prolonged QTc, iRBBB   Recent Labs: 03/21/2017: Magnesium 1.9 05/15/2017: B Natriuretic Peptide 342.8; BUN 7; Creatinine, Ser 0.82; Hemoglobin 13.1; Platelets 206; Potassium 3.5; Sodium 135    Lipid Panel  No results found for: CHOL, TRIG, HDL, CHOLHDL, VLDL, LDLCALC, LDLDIRECT   Wt Readings from Last 3 Encounters:  05/24/17 159 lb 9.6 oz (72.4 kg)  05/16/17 163 lb (73.9 kg)  05/15/17 163 lb (73.9 kg)      Other studies Reviewed: Additional studies/ records that were reviewed today include: TTE 05/28/16  Review of the above records today demonstrates:  - Left ventricle: The cavity size was normal. There was mild   concentric hypertrophy. Systolic function was normal. The   estimated ejection fraction was in the range of 60% to 65%. Wall   motion was normal; there were no regional wall motion   abnormalities. Features are consistent with a pseudonormal left   ventricular filling pattern, with concomitant abnormal relaxation   and increased filling pressure (grade 2 diastolic dysfunction).   Doppler parameters are consistent with elevated ventricular   end-diastolic filling pressure. - Aortic valve: Trileaflet; mildly thickened leaflets. There was   mild regurgitation. - Aortic root: The aortic root was normal in size. - Mitral valve: Calcified annulus. Moderately thickened, moderately   calcified leaflets . The findings are consistent with moderate   stenosis. There was mild regurgitation. Mean gradient (D): 6 mm    Hg. Peak gradient (D): 12 mm Hg. - Left atrium: The atrium was severely dilated. - Right ventricle: Systolic function was normal. - Tricuspid valve: There was mild regurgitation. - Inferior vena cava: The vessel was normal in size. - Pericardium, extracardiac: Features were not consistent with   tamponade physiology.   ASSESSMENT AND PLAN:  1.  Persistent atrial fibrillation/flutter: Did not tolerate amiodarone and has a recurrent arrhythmia on flecainide.  Had atrial fibrillation ablation 03/10/2017.  Had recurrent atrial fibrillation/flutter since her ablation.  This did occur in the setting of respiratory illness.  She is in sinus rhythm today post cardioversion.  I told her that if she goes back into atrial fibrillation, she may be a candidate for repeat ablation.  This patients CHA2DS2-VASc Score and unadjusted Ischemic Stroke Rate (% per year) is equal to 3.2 % stroke rate/year from a score of 3  Above score calculated as 1 point each if present [CHF, HTN, DM, Vascular=MI/PAD/Aortic Plaque, Age if 65-74, or Female] Above score calculated as 2 points each if present [Age > 75, or Stroke/TIA/TE]   2. Pericardial effusion: Has been stable without tamponade physiology.  No changes.  3. Hypertension: Continue Coreg, losartan, HCTZ    Current medicines are reviewed at length with the patient today.   The patient does not have concerns regarding her medicines.  The following changes were made today: None  Labs/ tests ordered today include:  Orders Placed This Encounter  Procedures  . EKG 12-Lead     Disposition:   FU with Yliana Gravois 3 months  Signed, Danity Schmelzer Meredith Leeds, MD  05/24/2017 8:52 AM     [facility] [address] [place] [place] [address] [phone] (office) [phone] (fax)

## 2017-05-24 NOTE — Patient Instructions (Signed)
Medication Instructions: Your physician recommends that you continue on your current medications as directed. Please refer to the Current Medication list given to you today.   Labwork: None ordered  Procedures/Testing: None ordered  Follow-Up: Your physician recommends that you schedule a follow-up appointment in: 3 months with Dr. Curt Bears.   Any Additional Special Instructions Will Be Listed Below (If Applicable).     If you need a refill on your cardiac medications before your next appointment, please call your pharmacy.

## 2017-06-07 ENCOUNTER — Ambulatory Visit: Payer: Medicare Other | Admitting: Cardiology

## 2017-06-08 ENCOUNTER — Encounter: Payer: Self-pay | Admitting: Cardiology

## 2017-06-08 ENCOUNTER — Other Ambulatory Visit (HOSPITAL_COMMUNITY): Payer: Self-pay | Admitting: Nurse Practitioner

## 2017-06-09 NOTE — Telephone Encounter (Signed)
Per Dr. Curt Bears request -- scheduled patient to see him next week, 5/21. Patient verbalized understanding and agreeable to plan.

## 2017-06-14 ENCOUNTER — Ambulatory Visit (INDEPENDENT_AMBULATORY_CARE_PROVIDER_SITE_OTHER): Payer: Medicare Other | Admitting: Cardiology

## 2017-06-14 ENCOUNTER — Encounter: Payer: Self-pay | Admitting: Cardiology

## 2017-06-14 VITALS — BP 126/88 | HR 76 | Ht 60.0 in | Wt 157.4 lb

## 2017-06-14 DIAGNOSIS — I313 Pericardial effusion (noninflammatory): Secondary | ICD-10-CM

## 2017-06-14 DIAGNOSIS — I481 Persistent atrial fibrillation: Secondary | ICD-10-CM | POA: Diagnosis not present

## 2017-06-14 DIAGNOSIS — I10 Essential (primary) hypertension: Secondary | ICD-10-CM

## 2017-06-14 DIAGNOSIS — Z01812 Encounter for preprocedural laboratory examination: Secondary | ICD-10-CM | POA: Diagnosis not present

## 2017-06-14 DIAGNOSIS — I4819 Other persistent atrial fibrillation: Secondary | ICD-10-CM

## 2017-06-14 DIAGNOSIS — I3139 Other pericardial effusion (noninflammatory): Secondary | ICD-10-CM

## 2017-06-14 NOTE — Patient Instructions (Addendum)
Medication Instructions:  Your physician recommends that you continue on your current medications as directed. Please refer to the Current Medication list given to you today.  Labwork: Pre procedure labs today: BMET & CBC   Testing/Procedures: Your physician has requested that you have cardiac CT within 7 days PRIOR to your ablation. Cardiac computed tomography (CT) is a painless test that uses an x-ray machine to take clear, detailed pictures of your heart. For further information please visit [facility]. Please follow instruction below under special instructions.   Your physician has recommended that you have an ablation. Catheter ablation is a medical procedure used to treat some cardiac arrhythmias (irregular heartbeats). During catheter ablation, a long, thin, flexible tube is put into a blood vessel in your groin (upper thigh), or neck. This tube is called an ablation catheter. It is then guided to your heart through the blood vessel. Radio frequency waves destroy small areas of heart tissue where abnormal heartbeats may cause an arrhythmia to start. Please see the instruction sheet given to you today.  Instructions for your ablation: 1. Please arrive at the [facility], Main Entrance "A", of [facility] at 5:30 A.M. on 06/24/2017. 2. Do not eat or drink after midnight the night prior to the procedure. 3. Do not miss any doses of ELIQUIS prior to the morning of the procedure.  4. Do not take any medications the morning of the procedure. 5. You will shaved for this procedure (if needed). Typically both groins, chest and back  are shaven. We ask that you shave these areas yourself at home 1-2 days prior  to the procedure. If you are uncomfortable/unable, then hospital staff will shave  these areas the morning of your procedure. 6. Plan for an overnight stay in the hospital. 7. You will need someone to drive you home at discharge.   Follow-Up: Your physician recommends that  you schedule a follow-up appointment in: 4 weeks, after your procedure on 06/24/2017, with Roderic Palau in the AFib clinic.  Your physician recommends that you schedule a follow-up appointment in: 3 months, after your procedure on 06/24/2017, with Dr. Curt Bears.    * If you need a refill on your cardiac medications before your next appointment, please call your pharmacy.   *Please note that any paperwork needing to be filled out by the provider will need to be addressed at the front desk prior to seeing the provider. Please note that any FMLA, disability or other documents regarding health condition is subject to a $25.00 charge that must be received prior to completion of paperwork in the form of a money order or check.  Thank you for choosing [facility]!!   Mary Curet, RN [phone]   Any Other Special Instructions Will Be Listed Below (If Applicable).  CT INSTRUCTIONS Please arrive at the [facility] main entrance of [facility] at x ______ AM (30-45 minutes prior to test start time)  [facility] [address] [place], [place] [address] [phone]  Proceed to the [facility] Radiology Department (First Floor).  Please follow these instructions carefully (unless otherwise directed):  Hold all erectile dysfunction medications at least 48 hours prior to test.  On the Night Before the Test: . Drink plenty of water. . Do not consume any caffeinated/decaffeinated beverages or chocolate 12 hours prior to your test. . Do not take any antihistamines 12 hours prior to your test. . If you take Metformin do not take 24 hours prior to test. .  If the patient has contrast allergy: ? Patient will need a prescription for Prednisone and very clear instructions (as follows): 1. Prednisone 50 mg - take 13 hours prior to test 2. Take another Prednisone 50 mg 7 hours prior to test 3. Take another Prednisone 50 mg 1 hour prior to test 4. Take Benadryl 50 mg 1  hour prior to test . Patient must complete all four doses of above prophylactic medications. . Patient will need a ride after test due to Benadryl.  On the Day of the Test: . Drink plenty of water. Do not drink any water within one hour of the test. . Do not eat any food 4 hours prior to the test. . You may take your regular medications prior to the test. . MAKE SURE TO TAKE YOUR CARVEDILOL THE MORNING OF THIS CT TESTING . HOLD Furosemide morning of the test.  After the Test: . Drink plenty of water. . After receiving IV contrast, you may experience a mild flushed feeling. This is normal. . On occasion, you may experience a mild rash up to 24 hours after the test. This is not dangerous. If this occurs, you can take Benadryl 25 mg and increase your fluid intake. . If you experience trouble breathing, this can be serious. If it is severe call 911 IMMEDIATELY. If it is mild, please call our office. . If you take any of these medications: Glipizide/Metformin, Avandament, Glucavance, please do not take 48 hours after completing test.

## 2017-06-14 NOTE — H&P (View-Only) (Signed)
Electrophysiology Office Note   Date:  06/14/2017   ID:  Mary Farrell, DOB [birth year], MRN [id number]  PCP:  Lawerance Cruel, MD  Cardiologist:  Oval Linsey Primary Electrophysiologist:  Malai Lady Meredith Leeds, MD    Chief Complaint  Patient presents with  . Follow-up    Persistent Afib/Discuss repeat ablation     History of Present Illness: Mary Farrell is a 72 y.o. female who is being seen today for the evaluation of atrial fibrillation at the request of Lawerance Cruel, MD. Presenting today for electrophysiology evaluation. She has a history of moderate aortic regurgitation, mild to moderate mitral stenosis, chronic pericardial effusion, paroxysmal atrial flutter, and hypertension. She had a heart catheterization in 1996 revealed normal coronaries with mild MS and mild pulmonary hypertension.  She was admitted 09/14/2015 with atrial flutter. She was started on amiodarone but did not tolerate it due to nausea.  He has had multiple cardioversions in the past.  She had an atrial fibrillation/flutter ablation 03/10/2017.  She has had much more in the way of atrial fibrillation and atrial flutter.  She has symptoms of weakness, fatigue, and shortness of breath.  Today, denies symptoms of palpitations, chest pain, shortness of breath, orthopnea, PND, lower extremity edema, claudication, dizziness, presyncope, syncope, bleeding, or neurologic sequela. The patient is tolerating medications without difficulties.  She is in atrial flutter today.   Past Medical History:  Diagnosis Date  . Aortic regurgitation 08/13/2015  . Arthritis    "hands, wrists, back, feet, toes" (09/18/2015)  . Atypical chest pain 05/13/2016  . Chest pain    remote cath in 1996 with NORMAL coronaries noted  . Exogenous obesity    history of   . GERD (gastroesophageal reflux disease)   . Glaucoma, both eyes   . Headache, migraine    "stopped in my 50's" (09/18/2015)  . History of cardiovascular stress test 2007   showing no ischemia  . Hypertension   . Mitral stenosis with insufficiency   . Murmur    of mitral stenosis and moderate aortic insufficiency      . Palpitations    occasional  . Personal history of rheumatic heart disease   . SBE (subacute bacterial endocarditis)    prophylaxis  . Sliding hiatal hernia    Past Surgical History:  Procedure Laterality Date  . ABDOMINAL HYSTERECTOMY  1975  . APPENDECTOMY  1977  . ATRIAL FIBRILLATION ABLATION N/A 03/10/2017   Procedure: ATRIAL FIBRILLATION ABLATION;  Surgeon: [name], MD;  Location: [place] [facility];  Service: Cardiovascular;  Laterality: N/A;  . BUNIONECTOMY Right 2000s  . CARDIAC CATHETERIZATION  01/13/1995   normal coronary anatomy and mild mitral stenosis and mild pulmonary hypertension  . CARDIOVERSION N/A 09/22/2015   Procedure: CARDIOVERSION;  Surgeon: Jerline Pain, MD;  Location: [place];  Service: Cardiovascular;  Laterality: N/A;  . CARDIOVERSION N/A 10/25/2016   Procedure: CARDIOVERSION;  Surgeon: Fay Records, MD;  Location: [facility] ENDOSCOPY;  Service: Cardiovascular;  Laterality: N/A;  . CARDIOVERSION N/A 04/15/2017   Procedure: CARDIOVERSION;  Surgeon: Josue Hector, MD;  Location: [facility] ENDOSCOPY;  Service: Cardiovascular;  Laterality: N/A;  . CARDIOVERSION N/A 05/16/2017   Procedure: CARDIOVERSION;  Surgeon: Pixie Casino, MD;  Location: [facility] ENDOSCOPY;  Service: Cardiovascular;  Laterality: N/A;  . CATARACT EXTRACTION W/ INTRAOCULAR LENS  IMPLANT, BILATERAL Bilateral 2013  . LAPAROSCOPIC CHOLECYSTECTOMY  1997  . TONSILLECTOMY AND ADENOIDECTOMY  1990s  . UVULOPALATOPHARYNGOPLASTY, TONSILLECTOMY AND SEPTOPLASTY  1990s  Current Outpatient Medications  Medication Sig Dispense Refill  . acetaminophen (TYLENOL) 500 MG tablet Take 500 mg by mouth every 6 (six) hours as needed for moderate pain or headache.    . ALPRAZolam (XANAX) 0.25 MG tablet Take 0.125 mg by mouth 2 (two) times daily as needed for  anxiety.     Marland Kitchen apixaban (ELIQUIS) 5 MG TABS tablet Take 5 mg by mouth 2 (two) times daily.    . B Complex Vitamins (VITAMIN B COMPLEX PO) Take 1 capsule by mouth at bedtime.     . benzonatate (TESSALON) 200 MG capsule Take 200 mg by mouth 3 (three) times daily as needed for cough.  0  . carvedilol (COREG) 6.25 MG tablet Take 6.25 mg by mouth 2 (two) times daily with a meal.    . Cholecalciferol (VITAMIN D) 2000 UNITS CAPS Take 2,000 Units by mouth daily.      Marland Kitchen conjugated estrogens (PREMARIN) vaginal cream Place 1 Applicatorful vaginally daily as needed (irritation).     Mariane Baumgarten Calcium (STOOL SOFTENER PO) Take 1 capsule by mouth 2 (two) times daily as needed (for constipation).     . dorzolamide-timolol (COSOPT) 22.3-6.8 MG/ML ophthalmic solution Place 1 drop into both eyes daily.    Marland Kitchen doxepin (SINEQUAN) 10 MG capsule Take 1 to 2 tablets at bedtime as needed    . flecainide (TAMBOCOR) 50 MG tablet Take 2 tablets (100 mg total) by mouth 2 (two) times daily. 120 tablet 3  . furosemide (LASIX) 20 MG tablet Take 1 tablet (20 mg total) by mouth daily as needed for fluid or edema (weight gain). 15 tablet 1  . gabapentin (NEURONTIN) 300 MG capsule Take 300 mg by mouth 2 (two) times daily.     . hydrochlorothiazide (HYDRODIURIL) 12.5 MG tablet Take 12.5 mg by mouth daily.    Marland Kitchen latanoprost (XALATAN) 0.005 % ophthalmic solution Place 1 drop into both eyes at bedtime.   6  . losartan (COZAAR) 100 MG tablet Take 100 mg by mouth daily.    . Multiple Vitamin (MULTIVITAMIN WITH MINERALS) TABS tablet Take 1 tablet by mouth daily.    . NON FORMULARY Apply 1 application topically 2 (two) times daily as needed (for eczema). Traimcinolone/[employer] Moist Cream    . omeprazole (PRILOSEC) 40 MG capsule Take 40 mg by mouth daily.     . ondansetron (ZOFRAN) 4 MG tablet Take 4 mg by mouth every 8 (eight) hours as needed for nausea or vomiting.    Marland Kitchen oxybutynin (DITROPAN-XL) 10 MG 24 hr tablet Take 10 mg by mouth daily as  needed (frequent urination).     Marland Kitchen PRESCRIPTION MEDICATION Inhale into the lungs at bedtime. CPAP    . promethazine (PHENERGAN) 25 MG tablet Take 1 tablet (25 mg total) by mouth every 6 (six) hours as needed for nausea. 20 tablet 0   No current facility-administered medications for this visit.     Allergies:   Metoprolol and Oxaprozin   Social History:  The patient  reports that she has never smoked. She has never used smokeless tobacco. She reports that she does not drink alcohol or use drugs.   Family History:  The patient's family history includes Diabetes in her brother; Heart attack in her mother; Heart failure in her maternal grandfather; Hypertension in her brother; Kidney disease in her brother.    ROS:  Please see the history of present illness.   Otherwise, review of systems is positive for leg swelling, shortness of breath, palpitations,  constipation, depression, anxiety, easy bruising.   All other systems are reviewed and negative.   PHYSICAL EXAM: VS:  BP 126/88   Pulse 76   Ht 5' (1.524 m)   Wt 157 lb 6.4 oz (71.4 kg)   SpO2 96%   BMI 30.74 kg/m  , BMI Body mass index is 30.74 kg/m. GEN: Well nourished, well developed, in no acute distress  HEENT: normal  Neck: no JVD, carotid bruits, or masses Cardiac: RRR; no murmurs, rubs, or gallops,no edema  Respiratory:  clear to auscultation bilaterally, normal work of breathing GI: soft, nontender, nondistended, + BS MS: no deformity or atrophy  Skin: warm and dry Neuro:  Strength and sensation are intact Psych: euthymic mood, full affect  EKG:  EKG is ordered today. Personal review of the ekg ordered shows atrial flutter, rate 76, rightward axis, nonspecific interventricular conduction delay, anterior T wave inversions  Recent Labs: 03/21/2017: Magnesium 1.9 05/15/2017: B Natriuretic Peptide 342.8; BUN 7; Creatinine, Ser 0.82; Hemoglobin 13.1; Platelets 206; Potassium 3.5; Sodium 135    Lipid Panel  No results found  for: CHOL, TRIG, HDL, CHOLHDL, VLDL, LDLCALC, LDLDIRECT   Wt Readings from Last 3 Encounters:  06/14/17 157 lb 6.4 oz (71.4 kg)  05/24/17 159 lb 9.6 oz (72.4 kg)  05/16/17 163 lb (73.9 kg)      Other studies Reviewed: Additional studies/ records that were reviewed today include: TTE 05/28/16  Review of the above records today demonstrates:  - Left ventricle: The cavity size was normal. There was mild   concentric hypertrophy. Systolic function was normal. The   estimated ejection fraction was in the range of 60% to 65%. Wall   motion was normal; there were no regional wall motion   abnormalities. Features are consistent with a pseudonormal left   ventricular filling pattern, with concomitant abnormal relaxation   and increased filling pressure (grade 2 diastolic dysfunction).   Doppler parameters are consistent with elevated ventricular   end-diastolic filling pressure. - Aortic valve: Trileaflet; mildly thickened leaflets. There was   mild regurgitation. - Aortic root: The aortic root was normal in size. - Mitral valve: Calcified annulus. Moderately thickened, moderately   calcified leaflets . The findings are consistent with moderate   stenosis. There was mild regurgitation. Mean gradient (D): 6 mm   Hg. Peak gradient (D): 12 mm Hg. - Left atrium: The atrium was severely dilated. - Right ventricle: Systolic function was normal. - Tricuspid valve: There was mild regurgitation. - Inferior vena cava: The vessel was normal in size. - Pericardium, extracardiac: Features were not consistent with   tamponade physiology.   ASSESSMENT AND PLAN:  1.  Persistent atrial fibrillation/flutter: Status post ablation 03/10/2017.  On Eliquis.  Unfortunately, she has had recurrent arrhythmia.  Plan for repeat ablation.  Risks and benefits include bleeding, tamponade, heart block, stroke, damage to surrounding organs.  She understands these risks and is agreed to the procedure.    This patients  CHA2DS2-VASc Score and unadjusted Ischemic Stroke Rate (% per year) is equal to 3.2 % stroke rate/year from a score of 3  Above score calculated as 1 point each if present [CHF, HTN, DM, Vascular=MI/PAD/Aortic Plaque, Age if 65-74, or Female] Above score calculated as 2 points each if present [Age > 75, or Stroke/TIA/TE]   2. Pericardial effusion: Has been stable without tamponade physiology  3. Hypertension: Currently well controlled    Current medicines are reviewed at length with the patient today.   The patient does  not have concerns regarding her medicines.  The following changes were made today: None  Labs/ tests ordered today include:  Orders Placed This Encounter  Procedures  . CT CARDIAC MORPH/PULM VEIN W/CM&W/O CA SCORE  . CT CORONARY FRACTIONAL FLOW RESERVE DATA PREP  . CT CORONARY FRACTIONAL FLOW RESERVE FLUID ANALYSIS  . Basic Metabolic Panel (BMET)  . CBC  . EKG 12-Lead     Disposition:   FU with Delshawn Stech 3 months  Signed, Akia Desroches Meredith Leeds, MD  06/14/2017 12:17 PM     [address] [place] [address] [phone] (office) [phone] (fax)

## 2017-06-14 NOTE — Progress Notes (Signed)
Electrophysiology Office Note   Date:  06/14/2017   ID:  Mary Farrell, DOB 10-Jun-1945, MRN [id number]  PCP:  Lawerance Cruel, MD  Cardiologist:  Oval Linsey Primary Electrophysiologist:  Evella Kasal Meredith Leeds, MD    Chief Complaint  Patient presents with  . Follow-up    Persistent Afib/Discuss repeat ablation     History of Present Illness: Mary Farrell is a 72 y.o. female who is being seen today for the evaluation of atrial fibrillation at the request of Lawerance Cruel, MD. Presenting today for electrophysiology evaluation. She has a history of moderate aortic regurgitation, mild to moderate mitral stenosis, chronic pericardial effusion, paroxysmal atrial flutter, and hypertension. She had a heart catheterization in 1996 revealed normal coronaries with mild MS and mild pulmonary hypertension.  She was admitted 09/14/2015 with atrial flutter. She was started on amiodarone but did not tolerate it due to nausea.  He has had multiple cardioversions in the past.  She had an atrial fibrillation/flutter ablation 03/10/2017.  She has had much more in the way of atrial fibrillation and atrial flutter.  She has symptoms of weakness, fatigue, and shortness of breath.  Today, denies symptoms of palpitations, chest pain, shortness of breath, orthopnea, PND, lower extremity edema, claudication, dizziness, presyncope, syncope, bleeding, or neurologic sequela. The patient is tolerating medications without difficulties.  She is in atrial flutter today.   Past Medical History:  Diagnosis Date  . Aortic regurgitation 08/13/2015  . Arthritis    "hands, wrists, back, feet, toes" (09/18/2015)  . Atypical chest pain 05/13/2016  . Chest pain    remote cath in 1996 with NORMAL coronaries noted  . Exogenous obesity    history of   . GERD (gastroesophageal reflux disease)   . Glaucoma, both eyes   . Headache, migraine    "stopped in my 50's" (09/18/2015)  . History of cardiovascular stress test 2007   showing no ischemia  . Hypertension   . Mitral stenosis with insufficiency   . Murmur    of mitral stenosis and moderate aortic insufficiency      . Palpitations    occasional  . Personal history of rheumatic heart disease   . SBE (subacute bacterial endocarditis)    prophylaxis  . Sliding hiatal hernia    Past Surgical History:  Procedure Laterality Date  . ABDOMINAL HYSTERECTOMY  1975  . APPENDECTOMY  1977  . ATRIAL FIBRILLATION ABLATION N/A 03/10/2017   Procedure: ATRIAL FIBRILLATION ABLATION;  Surgeon: [name], MD;  Location: [place] [facility];  Service: Cardiovascular;  Laterality: N/A;  . BUNIONECTOMY Right 2000s  . CARDIAC CATHETERIZATION  01/13/1995   normal coronary anatomy and mild mitral stenosis and mild pulmonary hypertension  . CARDIOVERSION N/A 09/22/2015   Procedure: CARDIOVERSION;  Surgeon: Jerline Pain, MD;  Location: [place];  Service: Cardiovascular;  Laterality: N/A;  . CARDIOVERSION N/A 10/25/2016   Procedure: CARDIOVERSION;  Surgeon: Fay Records, MD;  Location: [facility] ENDOSCOPY;  Service: Cardiovascular;  Laterality: N/A;  . CARDIOVERSION N/A 04/15/2017   Procedure: CARDIOVERSION;  Surgeon: Josue Hector, MD;  Location: [facility] ENDOSCOPY;  Service: Cardiovascular;  Laterality: N/A;  . CARDIOVERSION N/A 05/16/2017   Procedure: CARDIOVERSION;  Surgeon: Pixie Casino, MD;  Location: [facility] ENDOSCOPY;  Service: Cardiovascular;  Laterality: N/A;  . CATARACT EXTRACTION W/ INTRAOCULAR LENS  IMPLANT, BILATERAL Bilateral 2013  . LAPAROSCOPIC CHOLECYSTECTOMY  1997  . TONSILLECTOMY AND ADENOIDECTOMY  1990s  . UVULOPALATOPHARYNGOPLASTY, TONSILLECTOMY AND SEPTOPLASTY  1990s  Current Outpatient Medications  Medication Sig Dispense Refill  . acetaminophen (TYLENOL) 500 MG tablet Take 500 mg by mouth every 6 (six) hours as needed for moderate pain or headache.    . ALPRAZolam (XANAX) 0.25 MG tablet Take 0.125 mg by mouth 2 (two) times daily as needed for  anxiety.     Marland Kitchen apixaban (ELIQUIS) 5 MG TABS tablet Take 5 mg by mouth 2 (two) times daily.    . B Complex Vitamins (VITAMIN B COMPLEX PO) Take 1 capsule by mouth at bedtime.     . benzonatate (TESSALON) 200 MG capsule Take 200 mg by mouth 3 (three) times daily as needed for cough.  0  . carvedilol (COREG) 6.25 MG tablet Take 6.25 mg by mouth 2 (two) times daily with a meal.    . Cholecalciferol (VITAMIN D) 2000 UNITS CAPS Take 2,000 Units by mouth daily.      Marland Kitchen conjugated estrogens (PREMARIN) vaginal cream Place 1 Applicatorful vaginally daily as needed (irritation).     Mariane Baumgarten Calcium (STOOL SOFTENER PO) Take 1 capsule by mouth 2 (two) times daily as needed (for constipation).     . dorzolamide-timolol (COSOPT) 22.3-6.8 MG/ML ophthalmic solution Place 1 drop into both eyes daily.    Marland Kitchen doxepin (SINEQUAN) 10 MG capsule Take 1 to 2 tablets at bedtime as needed    . flecainide (TAMBOCOR) 50 MG tablet Take 2 tablets (100 mg total) by mouth 2 (two) times daily. 120 tablet 3  . furosemide (LASIX) 20 MG tablet Take 1 tablet (20 mg total) by mouth daily as needed for fluid or edema (weight gain). 15 tablet 1  . gabapentin (NEURONTIN) 300 MG capsule Take 300 mg by mouth 2 (two) times daily.     . hydrochlorothiazide (HYDRODIURIL) 12.5 MG tablet Take 12.5 mg by mouth daily.    Marland Kitchen latanoprost (XALATAN) 0.005 % ophthalmic solution Place 1 drop into both eyes at bedtime.   6  . losartan (COZAAR) 100 MG tablet Take 100 mg by mouth daily.    . Multiple Vitamin (MULTIVITAMIN WITH MINERALS) TABS tablet Take 1 tablet by mouth daily.    . NON FORMULARY Apply 1 application topically 2 (two) times daily as needed (for eczema). Traimcinolone/[employer] Moist Cream    . omeprazole (PRILOSEC) 40 MG capsule Take 40 mg by mouth daily.     . ondansetron (ZOFRAN) 4 MG tablet Take 4 mg by mouth every 8 (eight) hours as needed for nausea or vomiting.    Marland Kitchen oxybutynin (DITROPAN-XL) 10 MG 24 hr tablet Take 10 mg by mouth daily as  needed (frequent urination).     Marland Kitchen PRESCRIPTION MEDICATION Inhale into the lungs at bedtime. CPAP    . promethazine (PHENERGAN) 25 MG tablet Take 1 tablet (25 mg total) by mouth every 6 (six) hours as needed for nausea. 20 tablet 0   No current facility-administered medications for this visit.     Allergies:   Metoprolol and Oxaprozin   Social History:  The patient  reports that she has never smoked. She has never used smokeless tobacco. She reports that she does not drink alcohol or use drugs.   Family History:  The patient's family history includes Diabetes in her brother; Heart attack in her mother; Heart failure in her maternal grandfather; Hypertension in her brother; Kidney disease in her brother.    ROS:  Please see the history of present illness.   Otherwise, review of systems is positive for leg swelling, shortness of breath, palpitations,  constipation, depression, anxiety, easy bruising.   All other systems are reviewed and negative.   PHYSICAL EXAM: VS:  BP 126/88   Pulse 76   Ht 5' (1.524 m)   Wt 157 lb 6.4 oz (71.4 kg)   SpO2 96%   BMI 30.74 kg/m  , BMI Body mass index is 30.74 kg/m. GEN: Well nourished, well developed, in no acute distress  HEENT: normal  Neck: no JVD, carotid bruits, or masses Cardiac: RRR; no murmurs, rubs, or gallops,no edema  Respiratory:  clear to auscultation bilaterally, normal work of breathing GI: soft, nontender, nondistended, + BS MS: no deformity or atrophy  Skin: warm and dry Neuro:  Strength and sensation are intact Psych: euthymic mood, full affect  EKG:  EKG is ordered today. Personal review of the ekg ordered shows atrial flutter, rate 76, rightward axis, nonspecific interventricular conduction delay, anterior T wave inversions  Recent Labs: 03/21/2017: Magnesium 1.9 05/15/2017: B Natriuretic Peptide 342.8; BUN 7; Creatinine, Ser 0.82; Hemoglobin 13.1; Platelets 206; Potassium 3.5; Sodium 135    Lipid Panel  No results found  for: CHOL, TRIG, HDL, CHOLHDL, VLDL, LDLCALC, LDLDIRECT   Wt Readings from Last 3 Encounters:  06/14/17 157 lb 6.4 oz (71.4 kg)  05/24/17 159 lb 9.6 oz (72.4 kg)  05/16/17 163 lb (73.9 kg)      Other studies Reviewed: Additional studies/ records that were reviewed today include: TTE 05/28/16  Review of the above records today demonstrates:  - Left ventricle: The cavity size was normal. There was mild   concentric hypertrophy. Systolic function was normal. The   estimated ejection fraction was in the range of 60% to 65%. Wall   motion was normal; there were no regional wall motion   abnormalities. Features are consistent with a pseudonormal left   ventricular filling pattern, with concomitant abnormal relaxation   and increased filling pressure (grade 2 diastolic dysfunction).   Doppler parameters are consistent with elevated ventricular   end-diastolic filling pressure. - Aortic valve: Trileaflet; mildly thickened leaflets. There was   mild regurgitation. - Aortic root: The aortic root was normal in size. - Mitral valve: Calcified annulus. Moderately thickened, moderately   calcified leaflets . The findings are consistent with moderate   stenosis. There was mild regurgitation. Mean gradient (D): 6 mm   Hg. Peak gradient (D): 12 mm Hg. - Left atrium: The atrium was severely dilated. - Right ventricle: Systolic function was normal. - Tricuspid valve: There was mild regurgitation. - Inferior vena cava: The vessel was normal in size. - Pericardium, extracardiac: Features were not consistent with   tamponade physiology.   ASSESSMENT AND PLAN:  1.  Persistent atrial fibrillation/flutter: Status post ablation 03/10/2017.  On Eliquis.  Unfortunately, she has had recurrent arrhythmia.  Plan for repeat ablation.  Risks and benefits include bleeding, tamponade, heart block, stroke, damage to surrounding organs.  She understands these risks and is agreed to the procedure.    This patients  CHA2DS2-VASc Score and unadjusted Ischemic Stroke Rate (% per year) is equal to 3.2 % stroke rate/year from a score of 3  Above score calculated as 1 point each if present [CHF, HTN, DM, Vascular=MI/PAD/Aortic Plaque, Age if 65-74, or Female] Above score calculated as 2 points each if present [Age > 75, or Stroke/TIA/TE]   2. Pericardial effusion: Has been stable without tamponade physiology  3. Hypertension: Currently well controlled    Current medicines are reviewed at length with the patient today.   The patient does  not have concerns regarding her medicines.  The following changes were made today: None  Labs/ tests ordered today include:  Orders Placed This Encounter  Procedures  . CT CARDIAC MORPH/PULM VEIN W/CM&W/O CA SCORE  . CT CORONARY FRACTIONAL FLOW RESERVE DATA PREP  . CT CORONARY FRACTIONAL FLOW RESERVE FLUID ANALYSIS  . Basic Metabolic Panel (BMET)  . CBC  . EKG 12-Lead     Disposition:   FU with Maizey Menendez 3 months  Signed, Angelino Rumery Meredith Leeds, MD  06/14/2017 12:17 PM     [address] [place] [address] [phone] (office) [phone] (fax)

## 2017-06-15 LAB — BASIC METABOLIC PANEL
BUN/Creatinine Ratio: 13 (ref 12–28)
BUN: 10 mg/dL (ref 8–27)
CALCIUM: 9.5 mg/dL (ref 8.7–10.3)
CHLORIDE: 96 mmol/L (ref 96–106)
CO2: 26 mmol/L (ref 20–29)
Creatinine, Ser: 0.77 mg/dL (ref 0.57–1.00)
GFR calc non Af Amer: 77 mL/min/{1.73_m2} (ref >59)
GFR, EST AFRICAN AMERICAN: 89 mL/min/{1.73_m2} (ref >59)
Glucose: 89 mg/dL (ref 65–99)
Potassium: 4.3 mmol/L (ref 3.5–5.2)
Sodium: 140 mmol/L (ref 134–144)

## 2017-06-15 LAB — CBC
HEMATOCRIT: 39.1 % (ref 34.0–46.6)
HEMOGLOBIN: 12.9 g/dL (ref 11.1–15.9)
MCH: 30.1 pg (ref 26.6–33.0)
MCHC: 33 g/dL (ref 31.5–35.7)
MCV: 91 fL (ref 79–97)
Platelets: 209 10*3/uL (ref 150–450)
RBC: 4.29 x10E6/uL (ref 3.77–5.28)
RDW: 15.9 % — ABNORMAL HIGH (ref 12.3–15.4)
WBC: 5.9 10*3/uL (ref 3.4–10.8)

## 2017-06-21 ENCOUNTER — Ambulatory Visit (HOSPITAL_COMMUNITY)
Admission: RE | Admit: 2017-06-21 | Discharge: 2017-06-21 | Disposition: A | Discharge status: Home / Self Care | Payer: Medicare Other | Source: Ambulatory Visit | Attending: Cardiology | Admitting: Cardiology

## 2017-06-21 ENCOUNTER — Ambulatory Visit (HOSPITAL_COMMUNITY): Admission: RE | Admit: 2017-06-21 | Payer: Medicare Other | Source: Ambulatory Visit

## 2017-06-21 ENCOUNTER — Encounter: Payer: Self-pay | Admitting: Cardiology

## 2017-06-21 DIAGNOSIS — I4819 Other persistent atrial fibrillation: Secondary | ICD-10-CM

## 2017-06-21 DIAGNOSIS — I517 Cardiomegaly: Secondary | ICD-10-CM | POA: Diagnosis not present

## 2017-06-21 DIAGNOSIS — J9811 Atelectasis: Secondary | ICD-10-CM | POA: Diagnosis not present

## 2017-06-21 DIAGNOSIS — I313 Pericardial effusion (noninflammatory): Secondary | ICD-10-CM | POA: Diagnosis not present

## 2017-06-21 DIAGNOSIS — I4891 Unspecified atrial fibrillation: Secondary | ICD-10-CM | POA: Diagnosis not present

## 2017-06-21 DIAGNOSIS — I481 Persistent atrial fibrillation: Secondary | ICD-10-CM | POA: Insufficient documentation

## 2017-06-21 MED ORDER — DILTIAZEM HCL 25 MG/5ML IV SOLN
10.0000 mg | Freq: Once | INTRAVENOUS | Status: AC
  Administered 2017-06-21: 10 mg via INTRAVENOUS
  Filled 2017-06-21: qty 5

## 2017-06-21 MED ORDER — IOPAMIDOL (ISOVUE-370) INJECTION 76%
INTRAVENOUS | Status: AC
  Administered 2017-06-21: 80 mL via INTRAVENOUS
  Filled 2017-06-21: qty 100

## 2017-06-23 ENCOUNTER — Other Ambulatory Visit: Payer: Self-pay

## 2017-06-23 ENCOUNTER — Ambulatory Visit (HOSPITAL_COMMUNITY)
Admission: RE | Admit: 2017-06-23 | Discharge: 2017-06-23 | Disposition: A | Discharge status: Home / Self Care | Payer: Medicare Other | Source: Ambulatory Visit | Attending: Internal Medicine | Admitting: Internal Medicine

## 2017-06-23 ENCOUNTER — Ambulatory Visit (HOSPITAL_BASED_OUTPATIENT_CLINIC_OR_DEPARTMENT_OTHER)
Admission: RE | Admit: 2017-06-23 | Discharge: 2017-06-23 | Disposition: A | Discharge status: Home / Self Care | Payer: Medicare Other | Source: Ambulatory Visit | Attending: Cardiology | Admitting: Cardiology

## 2017-06-23 ENCOUNTER — Encounter (HOSPITAL_COMMUNITY)
Admission: RE | Disposition: A | Discharge status: Home / Self Care | Payer: Self-pay | Source: Ambulatory Visit | Attending: Internal Medicine

## 2017-06-23 DIAGNOSIS — Z7901 Long term (current) use of anticoagulants: Secondary | ICD-10-CM | POA: Diagnosis not present

## 2017-06-23 DIAGNOSIS — Z8249 Family history of ischemic heart disease and other diseases of the circulatory system: Secondary | ICD-10-CM | POA: Diagnosis not present

## 2017-06-23 DIAGNOSIS — H409 Unspecified glaucoma: Secondary | ICD-10-CM | POA: Diagnosis not present

## 2017-06-23 DIAGNOSIS — I080 Rheumatic disorders of both mitral and aortic valves: Secondary | ICD-10-CM | POA: Insufficient documentation

## 2017-06-23 DIAGNOSIS — M1990 Unspecified osteoarthritis, unspecified site: Secondary | ICD-10-CM | POA: Insufficient documentation

## 2017-06-23 DIAGNOSIS — I10 Essential (primary) hypertension: Secondary | ICD-10-CM | POA: Diagnosis not present

## 2017-06-23 DIAGNOSIS — I313 Pericardial effusion (noninflammatory): Secondary | ICD-10-CM | POA: Diagnosis not present

## 2017-06-23 DIAGNOSIS — I4892 Unspecified atrial flutter: Secondary | ICD-10-CM

## 2017-06-23 DIAGNOSIS — K219 Gastro-esophageal reflux disease without esophagitis: Secondary | ICD-10-CM | POA: Diagnosis not present

## 2017-06-23 DIAGNOSIS — I481 Persistent atrial fibrillation: Secondary | ICD-10-CM | POA: Insufficient documentation

## 2017-06-23 DIAGNOSIS — I272 Pulmonary hypertension, unspecified: Secondary | ICD-10-CM | POA: Insufficient documentation

## 2017-06-23 HISTORY — PX: TEE WITHOUT CARDIOVERSION: SHX5443

## 2017-06-23 SURGERY — ECHOCARDIOGRAM, TRANSESOPHAGEAL
Anesthesia: Moderate Sedation

## 2017-06-23 MED ORDER — BUTAMBEN-TETRACAINE-BENZOCAINE 2-2-14 % EX AERO
INHALATION_SPRAY | CUTANEOUS | Status: DC | PRN
  Administered 2017-06-23: 2 via TOPICAL

## 2017-06-23 MED ORDER — SODIUM CHLORIDE 0.9 % IV SOLN
INTRAVENOUS | Status: DC
  Administered 2017-06-23: 09:00:00 via INTRAVENOUS

## 2017-06-23 MED ORDER — FENTANYL CITRATE (PF) 100 MCG/2ML IJ SOLN
INTRAMUSCULAR | Status: AC
  Filled 2017-06-23: qty 2

## 2017-06-23 MED ORDER — FENTANYL CITRATE (PF) 100 MCG/2ML IJ SOLN
INTRAMUSCULAR | Status: DC | PRN
  Administered 2017-06-23: 25 ug via INTRAVENOUS

## 2017-06-23 MED ORDER — MIDAZOLAM HCL 5 MG/ML IJ SOLN
INTRAMUSCULAR | Status: AC
  Filled 2017-06-23: qty 2

## 2017-06-23 MED ORDER — LIDOCAINE VISCOUS HCL 2 % MT SOLN
OROMUCOSAL | Status: AC
  Filled 2017-06-23: qty 15

## 2017-06-23 MED ORDER — MIDAZOLAM HCL 10 MG/2ML IJ SOLN
INTRAMUSCULAR | Status: DC | PRN
  Administered 2017-06-23 (×2): 2 mg via INTRAVENOUS
  Administered 2017-06-23: 1 mg via INTRAVENOUS

## 2017-06-23 NOTE — CV Procedure (Signed)
TEE Patients throat anesthetized with viscous lidocaine Pt sedated with Fentanyl and Versed intravenously  TEE probe advanced to mid esophagus without difficulty.  LA, LAA without masses Spontanous echo contrast seen swirling in LA appendage.  Clears  Atria are enlarged  There is a small ASD in mid atrial septum with continuous L to R flow (may represent iatrogenic sequelae from prior ablation) MV is thickened with restricted motion   MVA by P T1/2 is 1.6 cm 2  TV normal    AV mildly thickened   Mild AI (eccentric) PV normal LV normal size   Function appears at least mildly depressed  Full report to follow  Procedure without complication

## 2017-06-23 NOTE — Discharge Instructions (Signed)

## 2017-06-23 NOTE — Progress Notes (Signed)
  Echocardiogram Echocardiogram Transesophageal has been performed.  Chia Rock L Androw 06/23/2017, 10:26 AM

## 2017-06-23 NOTE — Interval H&P Note (Signed)
History and Physical Interval Note:  06/23/2017 8:01 AM  Mary Farrell  has presented today for surgery, with the diagnosis of afib  The various methods of treatment have been discussed with the patient and family. After consideration of risks, benefits and other options for treatment, the patient has consented to  Procedure(s): TRANSESOPHAGEAL ECHOCARDIOGRAM (TEE) (N/A) as a surgical intervention .  The patient's history has been reviewed, patient examined, no change in status, stable for surgery.  I have reviewed the patient's chart and labs.  Questions were answered to the patient's satisfaction.     [name]

## 2017-06-24 ENCOUNTER — Encounter (HOSPITAL_COMMUNITY): Payer: Self-pay | Admitting: Internal Medicine

## 2017-06-24 ENCOUNTER — Other Ambulatory Visit: Payer: Self-pay

## 2017-06-24 ENCOUNTER — Ambulatory Visit (HOSPITAL_COMMUNITY): Payer: Medicare Other | Admitting: Certified Registered Nurse Anesthetist

## 2017-06-24 ENCOUNTER — Ambulatory Visit (HOSPITAL_COMMUNITY)
Admission: RE | Admit: 2017-06-24 | Discharge: 2017-06-25 | Disposition: A | Discharge status: Home / Self Care | Payer: Medicare Other | Source: Ambulatory Visit | Attending: Cardiology | Admitting: Cardiology

## 2017-06-24 ENCOUNTER — Ambulatory Visit (HOSPITAL_COMMUNITY)
Admission: RE | Disposition: A | Discharge status: Home / Self Care | Payer: Self-pay | Source: Ambulatory Visit | Attending: Cardiology

## 2017-06-24 DIAGNOSIS — Z880 Allergy status to penicillin: Secondary | ICD-10-CM | POA: Diagnosis not present

## 2017-06-24 DIAGNOSIS — I480 Paroxysmal atrial fibrillation: Secondary | ICD-10-CM | POA: Diagnosis not present

## 2017-06-24 DIAGNOSIS — I4892 Unspecified atrial flutter: Secondary | ICD-10-CM | POA: Insufficient documentation

## 2017-06-24 DIAGNOSIS — I272 Pulmonary hypertension, unspecified: Secondary | ICD-10-CM | POA: Diagnosis not present

## 2017-06-24 DIAGNOSIS — I050 Rheumatic mitral stenosis: Secondary | ICD-10-CM | POA: Insufficient documentation

## 2017-06-24 DIAGNOSIS — I483 Typical atrial flutter: Secondary | ICD-10-CM | POA: Diagnosis not present

## 2017-06-24 DIAGNOSIS — I10 Essential (primary) hypertension: Secondary | ICD-10-CM | POA: Diagnosis not present

## 2017-06-24 HISTORY — DX: Dependence on other enabling machines and devices: Z99.89

## 2017-06-24 HISTORY — DX: Obstructive sleep apnea (adult) (pediatric): G47.33

## 2017-06-24 HISTORY — DX: Paroxysmal atrial fibrillation: I48.0

## 2017-06-24 HISTORY — PX: ATRIAL FIBRILLATION ABLATION: EP1191

## 2017-06-24 LAB — POCT ACTIVATED CLOTTING TIME
Activated Clotting Time: 428 seconds
Activated Clotting Time: 378 seconds

## 2017-06-24 SURGERY — ATRIAL FIBRILLATION ABLATION
Anesthesia: General

## 2017-06-24 MED ORDER — FENTANYL CITRATE (PF) 100 MCG/2ML IJ SOLN: 25.0000 ug | INTRAMUSCULAR | Status: DC | PRN

## 2017-06-24 MED ORDER — DORZOLAMIDE HCL-TIMOLOL MAL 2-0.5 % OP SOLN
1.0000 [drp] | Freq: Every day | OPHTHALMIC | Status: DC
  Filled 2017-06-24: qty 10

## 2017-06-24 MED ORDER — HEPARIN (PORCINE) IN NACL 1000-0.9 UT/500ML-% IV SOLN
INTRAVENOUS | Status: AC
  Filled 2017-06-24: qty 500

## 2017-06-24 MED ORDER — SODIUM CHLORIDE 0.9 % IV SOLN
INTRAVENOUS | Status: DC
  Administered 2017-06-24: 07:00:00 via INTRAVENOUS

## 2017-06-24 MED ORDER — LIDOCAINE HCL (CARDIAC) PF 100 MG/5ML IV SOSY
PREFILLED_SYRINGE | INTRAVENOUS | Status: DC | PRN
  Administered 2017-06-24: 100 mg via INTRAVENOUS

## 2017-06-24 MED ORDER — MIDAZOLAM HCL 5 MG/5ML IJ SOLN
INTRAMUSCULAR | Status: DC | PRN
  Administered 2017-06-24: 1 mg via INTRAVENOUS

## 2017-06-24 MED ORDER — LATANOPROST 0.005 % OP SOLN
1.0000 [drp] | Freq: Every day | OPHTHALMIC | Status: DC
  Filled 2017-06-24: qty 2.5

## 2017-06-24 MED ORDER — DOBUTAMINE IN D5W 4-5 MG/ML-% IV SOLN
INTRAVENOUS | Status: AC
  Filled 2017-06-24: qty 250

## 2017-06-24 MED ORDER — MIDAZOLAM HCL 2 MG/2ML IJ SOLN
INTRAMUSCULAR | Status: AC
  Filled 2017-06-24: qty 2

## 2017-06-24 MED ORDER — ONDANSETRON HCL 4 MG/2ML IJ SOLN
4.0000 mg | Freq: Four times a day (QID) | INTRAMUSCULAR | Status: DC | PRN
  Administered 2017-06-24: 4 mg via INTRAVENOUS

## 2017-06-24 MED ORDER — MEPERIDINE HCL 25 MG/ML IJ SOLN: 6.2500 mg | INTRAMUSCULAR | Status: DC | PRN

## 2017-06-24 MED ORDER — LOSARTAN POTASSIUM 50 MG PO TABS
100.0000 mg | ORAL_TABLET | Freq: Every day | ORAL | Status: DC
  Administered 2017-06-24: 100 mg via ORAL
  Filled 2017-06-24 (×2): qty 2

## 2017-06-24 MED ORDER — HEPARIN SODIUM (PORCINE) 1000 UNIT/ML IJ SOLN
INTRAMUSCULAR | Status: AC
  Filled 2017-06-24: qty 1

## 2017-06-24 MED ORDER — FENTANYL CITRATE (PF) 250 MCG/5ML IJ SOLN
INTRAMUSCULAR | Status: AC
  Filled 2017-06-24: qty 5

## 2017-06-24 MED ORDER — HEPARIN (PORCINE) IN NACL 2-0.9 UNITS/ML
INTRAMUSCULAR | Status: AC | PRN
  Administered 2017-06-24 (×5): 500 mL

## 2017-06-24 MED ORDER — CARVEDILOL 6.25 MG PO TABS
6.2500 mg | ORAL_TABLET | Freq: Two times a day (BID) | ORAL | Status: DC
  Administered 2017-06-24 – 2017-06-25 (×2): 6.25 mg via ORAL
  Filled 2017-06-24 (×2): qty 1

## 2017-06-24 MED ORDER — BUPIVACAINE HCL (PF) 0.25 % IJ SOLN
INTRAMUSCULAR | Status: DC | PRN
  Administered 2017-06-24: 30 mL

## 2017-06-24 MED ORDER — PANTOPRAZOLE SODIUM 40 MG PO TBEC: 40.0000 mg | DELAYED_RELEASE_TABLET | Freq: Every day | ORAL | Status: DC

## 2017-06-24 MED ORDER — OXYBUTYNIN CHLORIDE ER 10 MG PO TB24
10.0000 mg | ORAL_TABLET | Freq: Every day | ORAL | Status: DC | PRN
  Filled 2017-06-24: qty 1

## 2017-06-24 MED ORDER — HYDROCHLOROTHIAZIDE 25 MG PO TABS
12.5000 mg | ORAL_TABLET | Freq: Every day | ORAL | Status: DC
  Filled 2017-06-24: qty 1

## 2017-06-24 MED ORDER — DOXEPIN HCL 10 MG PO CAPS
10.0000 mg | ORAL_CAPSULE | Freq: Every evening | ORAL | Status: DC | PRN
  Administered 2017-06-24: 20 mg via ORAL
  Filled 2017-06-24 (×2): qty 2

## 2017-06-24 MED ORDER — APIXABAN 5 MG PO TABS
5.0000 mg | ORAL_TABLET | Freq: Two times a day (BID) | ORAL | Status: DC
  Administered 2017-06-24 – 2017-06-25 (×2): 5 mg via ORAL
  Filled 2017-06-24 (×2): qty 1

## 2017-06-24 MED ORDER — FUROSEMIDE 20 MG PO TABS: 20.0000 mg | ORAL_TABLET | Freq: Every day | ORAL | Status: DC | PRN

## 2017-06-24 MED ORDER — PROMETHAZINE HCL 25 MG PO TABS: 25.0000 mg | ORAL_TABLET | Freq: Four times a day (QID) | ORAL | Status: DC | PRN

## 2017-06-24 MED ORDER — ADULT MULTIVITAMIN W/MINERALS CH: 1.0000 | ORAL_TABLET | Freq: Every day | ORAL | Status: DC

## 2017-06-24 MED ORDER — ROCURONIUM BROMIDE 100 MG/10ML IV SOLN
INTRAVENOUS | Status: DC | PRN
  Administered 2017-06-24: 50 mg via INTRAVENOUS

## 2017-06-24 MED ORDER — ONDANSETRON HCL 4 MG/2ML IJ SOLN
INTRAMUSCULAR | Status: DC | PRN
  Administered 2017-06-24: 4 mg via INTRAVENOUS

## 2017-06-24 MED ORDER — DOBUTAMINE IN D5W 4-5 MG/ML-% IV SOLN
INTRAVENOUS | Status: DC | PRN
  Administered 2017-06-24: 20 ug/kg/min via INTRAVENOUS

## 2017-06-24 MED ORDER — ESTROGENS, CONJUGATED 0.625 MG/GM VA CREA
1.0000 | TOPICAL_CREAM | Freq: Every day | VAGINAL | Status: DC | PRN
  Filled 2017-06-24: qty 30

## 2017-06-24 MED ORDER — SUGAMMADEX SODIUM 200 MG/2ML IV SOLN
INTRAVENOUS | Status: DC | PRN
  Administered 2017-06-24: 150 mg via INTRAVENOUS

## 2017-06-24 MED ORDER — ONDANSETRON HCL 4 MG/2ML IJ SOLN
INTRAMUSCULAR | Status: AC
  Filled 2017-06-24: qty 2

## 2017-06-24 MED ORDER — HEPARIN SODIUM (PORCINE) 1000 UNIT/ML IJ SOLN
INTRAMUSCULAR | Status: DC | PRN
  Administered 2017-06-24: 14000 [IU] via INTRAVENOUS

## 2017-06-24 MED ORDER — ACETAMINOPHEN 500 MG PO TABS: 500.0000 mg | ORAL_TABLET | Freq: Four times a day (QID) | ORAL | Status: DC | PRN

## 2017-06-24 MED ORDER — PROTAMINE SULFATE 10 MG/ML IV SOLN
INTRAVENOUS | Status: DC | PRN
  Administered 2017-06-24: 40 mg via INTRAVENOUS

## 2017-06-24 MED ORDER — SODIUM CHLORIDE 0.9% FLUSH: 3.0000 mL | INTRAVENOUS | Status: DC | PRN

## 2017-06-24 MED ORDER — VITAMIN D 1000 UNITS PO TABS: 2000.0000 [IU] | ORAL_TABLET | Freq: Every day | ORAL | Status: DC

## 2017-06-24 MED ORDER — SODIUM CHLORIDE 0.9 % IV SOLN: 250.0000 mL | INTRAVENOUS | Status: DC | PRN

## 2017-06-24 MED ORDER — ONDANSETRON HCL 4 MG PO TABS: 4.0000 mg | ORAL_TABLET | Freq: Three times a day (TID) | ORAL | Status: DC | PRN

## 2017-06-24 MED ORDER — FLECAINIDE ACETATE 100 MG PO TABS
100.0000 mg | ORAL_TABLET | Freq: Two times a day (BID) | ORAL | Status: DC
  Administered 2017-06-24 – 2017-06-25 (×2): 100 mg via ORAL
  Filled 2017-06-24 (×2): qty 1

## 2017-06-24 MED ORDER — DOCUSATE SODIUM 100 MG PO CAPS: 100.0000 mg | ORAL_CAPSULE | Freq: Two times a day (BID) | ORAL | Status: DC | PRN

## 2017-06-24 MED ORDER — ACETAMINOPHEN 325 MG PO TABS
650.0000 mg | ORAL_TABLET | ORAL | Status: DC | PRN
Start: 2017-06-24 — End: 2017-06-25

## 2017-06-24 MED ORDER — PHENYLEPHRINE HCL 10 MG/ML IJ SOLN
INTRAMUSCULAR | Status: DC | PRN
  Administered 2017-06-24: 80 ug via INTRAVENOUS

## 2017-06-24 MED ORDER — METOCLOPRAMIDE HCL 5 MG/ML IJ SOLN: 10.0000 mg | Freq: Once | INTRAMUSCULAR | Status: DC | PRN

## 2017-06-24 MED ORDER — ALPRAZOLAM 0.25 MG PO TABS
0.1250 mg | ORAL_TABLET | Freq: Two times a day (BID) | ORAL | Status: DC | PRN
  Administered 2017-06-24: 0.125 mg via ORAL
  Filled 2017-06-24: qty 1

## 2017-06-24 MED ORDER — FENTANYL CITRATE (PF) 100 MCG/2ML IJ SOLN
INTRAMUSCULAR | Status: DC | PRN
  Administered 2017-06-24: 50 ug via INTRAVENOUS
  Administered 2017-06-24: 100 ug via INTRAVENOUS

## 2017-06-24 MED ORDER — HEPARIN SODIUM (PORCINE) 1000 UNIT/ML IJ SOLN
INTRAMUSCULAR | Status: DC | PRN
  Administered 2017-06-24: 1000 [IU] via INTRAVENOUS

## 2017-06-24 MED ORDER — PROPOFOL 10 MG/ML IV BOLUS
INTRAVENOUS | Status: DC | PRN
  Administered 2017-06-24: 150 mg via INTRAVENOUS

## 2017-06-24 MED ORDER — BUPIVACAINE HCL (PF) 0.25 % IJ SOLN
INTRAMUSCULAR | Status: AC
  Filled 2017-06-24: qty 30

## 2017-06-24 MED ORDER — SODIUM CHLORIDE 0.9% FLUSH
3.0000 mL | Freq: Two times a day (BID) | INTRAVENOUS | Status: DC
  Administered 2017-06-24: 3 mL via INTRAVENOUS

## 2017-06-24 MED ORDER — GABAPENTIN 300 MG PO CAPS
300.0000 mg | ORAL_CAPSULE | Freq: Two times a day (BID) | ORAL | Status: DC
  Administered 2017-06-24: 300 mg via ORAL
  Filled 2017-06-24 (×2): qty 1

## 2017-06-24 SURGICAL SUPPLY — 20 items
BAG SNAP BAND KOVER 36X36 (MISCELLANEOUS) ×3 IMPLANT
BLANKET WARM UNDERBOD FULL ACC (MISCELLANEOUS) ×3 IMPLANT
CATH MAPPNG PENTARAY F 2-6-2MM (CATHETERS) ×1 IMPLANT
CATH SMTCH THERMOCOOL SF DF (CATHETERS) ×3 IMPLANT
CATH SOUNDSTAR 3D IMAGING (CATHETERS) ×3 IMPLANT
CATH WEBSTER BI DIR CS D-F CRV (CATHETERS) ×3 IMPLANT
COVER SWIFTLINK CONNECTOR (BAG) ×3 IMPLANT
PACK EP LATEX FREE (CUSTOM PROCEDURE TRAY) ×2
PACK EP LF (CUSTOM PROCEDURE TRAY) ×1 IMPLANT
PAD DEFIB LIFELINK (PAD) ×3 IMPLANT
PATCH CARTO3 (PAD) ×3 IMPLANT
PENTARAY F 2-6-2MM (CATHETERS) ×3
SHEATH AVANTI 11CM 7FR (SHEATH) ×3 IMPLANT
SHEATH AVANTI 11F 11CM (SHEATH) ×3 IMPLANT
SHEATH BAYLIS SUREFLEX  M 8.5 (SHEATH) ×4
SHEATH BAYLIS SUREFLEX M 8.5 (SHEATH) ×2 IMPLANT
SHEATH BAYLIS TRANSSEPTAL 98CM (NEEDLE) ×3 IMPLANT
SHEATH PINNACLE 8F 10CM (SHEATH) ×6 IMPLANT
SHEATH PINNACLE 9F 10CM (SHEATH) ×6 IMPLANT
TUBING SMART ABLATE COOLFLOW (TUBING) ×3 IMPLANT

## 2017-06-24 NOTE — Progress Notes (Addendum)
Site area: Left groin 7 an 11 french venous sheath was removed  Site Prior to Removal:  Level 0  Pressure Applied For 20 MINUTES    Bedrest Beginning at 1240p  Manual:   Yes.    Patient Status During Pull:  stable  Post Pull Groin Site:  Level 0  Post Pull Instructions Given:  Yes.    Post Pull Pulses Present:  Yes.    Dressing Applied:  Yes.    Comments:

## 2017-06-24 NOTE — Transfer of Care (Signed)
Immediate Anesthesia Transfer of Care Note  Patient: Mary Farrell  Procedure(s) Performed: ATRIAL FIBRILLATION ABLATION (N/A )  Patient Location: Endoscopy Unit  Anesthesia Type:General  Level of Consciousness: awake, alert , oriented and patient cooperative  Airway & Oxygen Therapy: Patient Spontanous Breathing and Patient connected to nasal cannula oxygen  Post-op Assessment: Report given to RN and Post -op Vital signs reviewed and stable  Post vital signs: Reviewed and stable  Last Vitals:  Vitals Value Taken Time  BP 140/59 06/24/2017 10:46 AM  Temp 36.7 C 06/24/2017 10:45 AM  Pulse 75 06/24/2017 10:47 AM  Resp 16 06/24/2017 10:47 AM  SpO2 100 % 06/24/2017 10:47 AM  Vitals shown include unvalidated device data.  Last Pain:  Vitals:   06/24/17 1045  TempSrc:   PainSc: 0-No pain         Complications: No apparent anesthesia complications

## 2017-06-24 NOTE — Anesthesia Procedure Notes (Signed)
Procedure Name: Intubation Date/Time: 06/24/2017 7:57 AM Performed by: Shirlyn Goltz, CRNA Pre-anesthesia Checklist: Patient identified, Emergency Drugs available, Suction available and Patient being monitored Patient Re-evaluated:Patient Re-evaluated prior to induction Oxygen Delivery Method: Circle system utilized Preoxygenation: Pre-oxygenation with 100% oxygen Induction Type: IV induction Laryngoscope Size: Mac and 3 Grade View: Grade II Tube type: Oral Tube size: 7.0 mm Number of attempts: 1 Airway Equipment and Method: Stylet Placement Confirmation: ETT inserted through vocal cords under direct vision,  positive ETCO2 and breath sounds checked- equal and bilateral Secured at: 19 cm Tube secured with: Tape Dental Injury: Teeth and Oropharynx as per pre-operative assessment

## 2017-06-24 NOTE — Anesthesia Postprocedure Evaluation (Signed)
Anesthesia Post Note  Patient: Mary Farrell  Procedure(s) Performed: ATRIAL FIBRILLATION ABLATION (N/A )     Patient location during evaluation: PACU Anesthesia Type: General Level of consciousness: awake and alert Pain management: pain level controlled Vital Signs Assessment: post-procedure vital signs reviewed and stable Respiratory status: spontaneous breathing, nonlabored ventilation, respiratory function stable and patient connected to nasal cannula oxygen Cardiovascular status: blood pressure returned to baseline and stable Postop Assessment: no apparent nausea or vomiting Anesthetic complications: no    Last Vitals:  Vitals:   06/24/17 1235 06/24/17 1245  BP: (!) 127/37 (!) 152/69  Pulse: 79 78  Resp: 14   Temp:    SpO2: 97% 96%    Last Pain:  Vitals:   06/24/17 1045  TempSrc:   PainSc: 0-No pain                 Montez Hageman

## 2017-06-24 NOTE — H&P (Signed)
Mary Farrell has presented today for surgery, with the diagnosis of atrial fibrillaton.  The various methods of treatment have been discussed with the patient and family. After consideration of risks, benefits and other options for treatment, the patient has consented to  Procedure(s): Catheter ablation as a surgical intervention .  Risks include but not limited to bleeding, tamponade, heart block, stroke, damage to surrounding organs, among others. The patient's history has been reviewed, patient examined, no change in status, stable for surgery.  I have reviewed the patient's chart and labs.  Questions were answered to the patient's satisfaction.    Will Curt Bears, MD 06/24/2017 7:09 AM

## 2017-06-24 NOTE — Discharge Instructions (Addendum)
Post procedure care instructions No driving for 4 days. No lifting over 5 lbs for 1 week. No vigorous or sexual activity for 1 week. You may return to work on 07/01/17. Keep procedure site clean & dry. If you notice increased pain, swelling, bleeding or pus, call/return!  You may shower, but no soaking baths/hot tubs/pools for 1 week.     You have an appointment set up with the [facility].  Multiple studies have shown that being followed by a dedicated atrial fibrillation clinic in addition to the standard care you receive from your other physicians improves health. We believe that enrollment in the atrial fibrillation clinic will allow [place] to better care for you.   The phone number to the [facility] is [phone]. The clinic is staffed Monday through Friday from 8:30am to 5pm.  Parking Directions: The clinic is located in the Heart and Vascular Building connected to [facility]. 1)From [address] turn on to [address] and go to the 3rd entrance  (Heart and Vascular entrance) on the right. 2)Look to the right for Heart &Vascular Parking Garage. 3)A code for the entrance is required please call the clinic to receive this.   4)Take the elevators to the 1st floor. Registration is in the room with the glass walls at the end of the hallway.  If you have any trouble parking or locating the clinic, please dont hesitate to call [phone].    Information on my medicine - ELIQUIS (apixaban)  Why was Eliquis prescribed for you? Eliquis was prescribed for you to reduce the risk of a blood clot forming that can cause a stroke if you have a medical condition called atrial fibrillation (a type of irregular heartbeat).  What do You need to know about Eliquis ? Take your Eliquis TWICE DAILY - one tablet in the morning and one tablet in the evening with or without food. If you have difficulty swallowing the tablet whole please discuss with your  pharmacist how to take the medication safely.  Take Eliquis exactly as prescribed by your doctor and DO NOT stop taking Eliquis without talking to the doctor who prescribed the medication.  Stopping may increase your risk of developing a stroke.  Refill your prescription before you run out.  After discharge, you should have regular check-up appointments with your healthcare provider that is prescribing your Eliquis.  In the future your dose may need to be changed if your kidney function or weight changes by a significant amount or as you get older.  What do you do if you miss a dose? If you miss a dose, take it as soon as you remember on the same day and resume taking twice daily.  Do not take more than one dose of ELIQUIS at the same time to make up a missed dose.  Important Safety Information A possible side effect of Eliquis is bleeding. You should call your healthcare provider right away if you experience any of the following: ? Bleeding from an injury or your nose that does not stop. ? Unusual colored urine (red or dark brown) or unusual colored stools (red or black). ? Unusual bruising for unknown reasons. ? A serious fall or if you hit your head (even if there is no bleeding).  Some medicines may interact with Eliquis and might increase your risk of bleeding or clotting while on Eliquis. To help avoid this, consult your healthcare provider or pharmacist prior to using any new prescription or non-prescription medications,  including herbals, vitamins, non-steroidal anti-inflammatory drugs (NSAIDs) and supplements.  This website has more information on Eliquis (apixaban): http://www.eliquis.com/eliquis/home

## 2017-06-24 NOTE — Anesthesia Preprocedure Evaluation (Signed)
Anesthesia Evaluation  Patient identified by MRN, date of birth, ID band Patient awake    Reviewed: Allergy & Precautions, NPO status , Patient's Chart, lab work & pertinent test results, reviewed documented beta blocker date and time   Airway Mallampati: II  TM Distance: <3 FB Neck ROM: Full    Dental  (+) Dental Advisory Given, Partial Upper   Pulmonary shortness of breath,    breath sounds clear to auscultation       Cardiovascular hypertension, Pt. on medications and Pt. on home beta blockers + dysrhythmias Atrial Fibrillation + Valvular Problems/Murmurs  Rhythm:Irregular Rate:Normal  LVEF 60-65%.There was moderate lokalized pericardial effusion around the inferior and inferolateral walls unchanged from prior exam on 10/22/15. No evidence for tamponade.  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 There is moderate mitral stenosis and  midl regurgitation.   Neuro/Psych    GI/Hepatic Neg liver ROS, hiatal hernia, GERD  ,  Endo/Other  negative endocrine ROS  Renal/GU negative Renal ROS     Musculoskeletal   Abdominal   Peds  Hematology   Anesthesia Other Findings   Reproductive/Obstetrics                             Anesthesia Physical  Anesthesia Plan  ASA: III  Anesthesia Plan: General   Post-op Pain Management:    Induction: Intravenous  PONV Risk Score and Plan: 3 and Ondansetron, Propofol infusion and Treatment may vary due to age or medical condition  Airway Management Planned: Oral ETT  Additional Equipment:   Intra-op Plan:   Post-operative Plan:   Informed Consent: I have reviewed the patients History and Physical, chart, labs and discussed the procedure including the risks, benefits and alternatives for the proposed anesthesia with the patient or authorized representative who has indicated his/her understanding and  acceptance.     Plan Discussed with: CRNA  Anesthesia Plan Comments:         Anesthesia Quick Evaluation

## 2017-06-24 NOTE — Progress Notes (Addendum)
Site area: Right groin a 9 french venous sheath X 2 was removed  Site Prior to Removal:  Level 0  Pressure Applied For 20 MINUTES    Bedrest Beginning at 1240p Manual:   Yes.    Patient Status During Pull:  stable  Post Pull Groin Site:  Level 0  Post Pull Instructions Given:  Yes.    Post Pull Pulses Present:  Yes.    Dressing Applied:  Yes.    Comments:  VS remain stable

## 2017-06-25 DIAGNOSIS — I4892 Unspecified atrial flutter: Secondary | ICD-10-CM | POA: Diagnosis not present

## 2017-06-25 DIAGNOSIS — I480 Paroxysmal atrial fibrillation: Secondary | ICD-10-CM | POA: Diagnosis not present

## 2017-06-25 DIAGNOSIS — I050 Rheumatic mitral stenosis: Secondary | ICD-10-CM | POA: Diagnosis not present

## 2017-06-25 DIAGNOSIS — I10 Essential (primary) hypertension: Secondary | ICD-10-CM | POA: Diagnosis not present

## 2017-06-25 MED ORDER — CARVEDILOL 12.5 MG PO TABS: 12.5000 mg | ORAL_TABLET | Freq: Two times a day (BID) | ORAL | 6 refills | Status: DC

## 2017-06-25 NOTE — Discharge Summary (Addendum)
Discharge Summary    Patient ID: Mary Farrell,  MRN: [id number], DOB/AGE: Jul 21, 1945 72 y.o.  Admit date: 06/24/2017 Discharge date: 06/25/2017  Primary Care Provider: Lawerance Cruel Primary Cardiologist: Caylei Sperry Meredith Leeds, MD     Discharge Diagnoses    Active Problems:   Paroxysmal A-fib [facility])   Allergies Allergies  Allergen Reactions  . Amoxicillin Diarrhea    Has patient had a PCN reaction causing immediate rash, facial/tongue/throat swelling, SOB or lightheadedness with hypotension: no Has patient had a PCN reaction causing severe rash involving mucus membranes or skin necrosis: no Has patient had a PCN reaction that required hospitalization: no Has patient had a PCN reaction occurring within the last 10 years: no If all of the above answers are "NO", then may proceed with Cephalosporin use.   . Metoprolol Other (See Comments)    Mouth tastes like mold  . Oxaprozin Nausea Only    Diagnostic Studies/Procedures    SURGEON:  Kao Conry Curt Bears, MD  PREPROCEDURE DIAGNOSES: 1. Paroxysmal atrial fibrillation. 2. Typical appearing atrial flutter  POSTPROCEDURE DIAGNOSES: 1. Paroxysmal  atrial fibrillation. 2. Typical appearing atrial flutter  PROCEDURES: 1. Comprehensive electrophysiologic study. 2. Coronary sinus pacing and recording. 3. Three-dimensional mapping of atrial fibrillation with additional mapping and ablation of a second discrete focus (atrial flutter) 4. Ablation of atrial fibrillation with additional mapping and ablation of a second discrete focus (atrial flutter) 5. Intracardiac echocardiography. 6. Transseptal puncture of an intact septum. 7. Arrhythmia induction with pacing with isuprel infusion  INTRODUCTION:  Mary Farrell is a 72 y.o. female with a history of paroxysmal atrial fibrillation and typical appearing atrial flutter who now presents for EP study and radiofrequency ablation.  The patient reports initially being diagnosed with  atrial fibrillation after presenting with symptomatic palpitations and fatgiue. This has recurred since her initial ablation. The patient reports increasing frequency and duration of atrial arrhythmias since that time.  The patient therefore presents today for catheter ablation of atrial fibrillation and atrial flutter.  DESCRIPTION OF PROCEDURE:  Informed written consent was obtained, and the patient was brought to the electrophysiology lab in a fasting state.  The patient was adequately sedated with intravenous medications as outlined in the anesthesia report.  The patient's left and right groins were prepped and draped in the usual sterile fashion by the EP lab staff.  Using a percutaneous Seldinger technique, two 8-French hemostasis sheaths were placed in the right common femoral vein; one 7-French and one 11-French hemostasis sheaths were placed into the left common femoral vein.    Catheter Placement:  A 7-French Biosense Webster Decapolar coronary sinus catheter was introduced through the right common femoral vein and advanced into the coronary sinus for recording and pacing from this location.    Initial Measurements: The patient presented to the electrophysiology lab in sinus rhythm.  The patients PR interval measured 280 msec with a QRS duration of 110 msec and a QT interval of 465 msec.    Intracardiac Echocardiography: A 10-French Biosense Webster AcuNav intracardiac echocardiography catheter was introduced through the right common femoral vein and advanced into the right atrium. Intracardiac echocardiography was performed of the left atrium, and a three-dimensional anatomical rendering of the left atrium was performed using CARTO sound technology.  The patient was noted to have a moderate sized left atrium.  The interatrial septum was prominent but not aneurysmal. All 4 pulmonary veins were visualized and noted to have separate ostia.  The pulmonary veins were moderate  in size.  The left  atrial appendage was visualized and did not reveal thrombus.   There was no evidence of pulmonary vein stenosis.   Transseptal Puncture: The right common femoral vein sheaths were was exchanged for an 8.5 [place] Agillis transseptal sheath and transseptal access was achieved in a standard fashion using a Brockenbrough needle under biplane fluoroscopy with intracardiac echocardiography confirmation of the transseptal puncture.  Once transseptal access had been achieved, heparin was administered intravenously and intra- arterially in order to maintain an ACT of greater than 350 seconds throughout the procedure.   3D Mapping and Ablation: A 3.5 mm Biosense Webster SmartTouch Thermocool ablation catheter was advanced into the right atrium.  The transseptal sheath was pulled back into the IVC over a guidewire.  The ablation catheter was advanced across the transseptal hole using the wire as a guide.  The transseptal sheath was then re-advanced over the guidewire into the left atrium.  A duodecapolar Biosense Webster circular mapping catheter was introduced through the transseptal sheath and positioned over the mouth of all 4 pulmonary veins.  Three-dimensional electroanatomical mapping was performed using CARTO technology.  This demonstrated electrical activity within the right upper pulmonary vein at baseline. The patient underwent successful sequential electrical isolation and anatomical encircling of all four pulmonary veins using radiofrequency current with a circular mapping catheter as a guide.  A WACA approach was used.  Entrance and exit block were achieved. Additional ablation was performed along the roof and floor of the left atrium to create a box lesion along the posterior wall. The ablation catheter was then pulled back into the right atrial and positioned along the cavo-tricuspid isthmus.  Mapping along the atrial side of the isthmus was performed.  This demonstrated a standard isthmus.  A series of  radiofrequency applications were then delivered along the isthmus.  Complete bidirectional cavotricuspid isthmus block was achieved as confirmed by differential atrial pacing from the low lateral right atrium.  A stimulus to earliest atrial activation across the isthmus measured 150 msec bi-directionally.  The patient was observe without return of conduction through the isthmus.  Measurements Following Ablation: Following ablation, Isuprel was infused up to 20 mcg/min with no inducible atrial fibrillation, atrial tachycardia, atrial flutter, or sustained PACs. In sinus rhythm with RR interval was 1021 msec, with PR 212 msec, QRS 103 msec, and Qt 521 msec.  Following ablation the AH interval measured 94 msec with an HV interval of 52 msec. Ventricular pacing was performed, which revealed VA dissociation when pacing at 600 msec. Rapid atrial pacing was performed, which revealed an AV Wenckebach cycle length of 520 msec.  Electroisolation was then again confirmed in all four pulmonary veins.  Intracardiac echocardiography was again performed, which revealed no pericardial effusion.  The procedure was therefore considered completed.  All catheters were removed, and the sheaths were aspirated and flushed.  The patient was transferred to the recovery area for sheath removal per protocol.  A limited bedside transthoracic echocardiogram revealed no pericardial effusion. EBL<58ml.  There were no early apparent complications.  CONCLUSIONS: 1. Sinus rhythm upon presentation.   2. Successful electrical isolation and anatomical encircling of all four pulmonary veins with radiofrequency current.    3. Cavo-tricuspid isthmus ablation was performed with complete bidirectional isthmus block achieved.  4. No inducible arrhythmias following ablation both on and off of dobutamine 5. No early apparent complications.   History of Present Illness     Mary Farrell is a 72 y.o. female with hx  of moderate aortic  regurgitation, mild to moderate mitral stenosis, chronic pericardial effusion, recurrent atrial fib/flutter, and hypertension presented for afib ablation.   She had a heart catheterization in 1996 revealed normal coronaries with mild MS and mild pulmonary hypertension.  She was admitted 09/14/2015 with atrial flutter. She was started on amiodarone but did not tolerate it due to nausea.  He has had multiple cardioversions in the past.  She had an atrial fibrillation/flutter ablation 03/10/2017.  She has had much more in the way of atrial fibrillation and atrial flutter.  She has symptoms of weakness, fatigue, and shortness of breath.  Last seen by Dr. Curt Bears 06/14/2017 for recurrent arrhythmias despite ablation. Recommended repeat ablation.   TEE 06/23/17 by Dr. Harrington Challenger as below: LA, LAA without masses Spontanous echo contrast seen swirling in LA appendage.  Clears  Atria are enlarged  There is a small ASD in mid atrial septum with continuous L to R flow (may represent iatrogenic sequelae from prior ablation) MV is thickened with restricted motion   MVA by P T1/2 is 1.6 cm 2  TV normal    AV mildly thickened   Mild AI (eccentric) PV normal LV normal size   Function appears at least mildly depressed   Hospital Course     Consultants: None  The patient present and underwent successful ablation as noted above. No complications. No overnight recurrent afib. Ambulated well. Lindy Garczynski resume all home medications except increase coreg to 12.5mg  BID  The patient has been seen by Dr. Curt Bears today and deemed ready for discharge home. All follow-up appointments have been scheduled. Discharge medications are listed below.    Discharge Vitals Blood pressure 134/72, pulse 85, temperature 98.6 F (37 C), temperature source Oral, resp. rate 17, height 5' (1.524 m), weight 156 lb (70.8 kg), SpO2 96 %.  Filed Weights   06/24/17 0603  Weight: 156 lb (70.8 kg)   Physical Exam  Constitutional: She is oriented  to person, place, and time and well-developed, well-nourished, and in no distress.  HENT:  Head: Normocephalic and atraumatic.  Eyes: Pupils are equal, round, and reactive to light. Conjunctivae are normal.  Neck: Normal range of motion. Neck supple.  Cardiovascular: Normal rate and regular rhythm.  R groin cath site without hematoma  Pulmonary/Chest: Effort normal and breath sounds normal.  Abdominal: Soft. Bowel sounds are normal.  Musculoskeletal: Normal range of motion.  Neurological: She is alert and oriented to person, place, and time.  Skin: Skin is warm and dry.  Psychiatric: Affect normal.    Labs & Radiologic Studies     Ct Cardiac Morph/pulm Vein W/cm&w/o Ca Score  Addendum Date: 06/21/2017   ADDENDUM REPORT: 06/21/2017 16:54 CLINICAL DATA:  Atrial fibrillation, pre-ablation EXAM: Cardiac CTA MEDICATIONS: Sub lingual nitro. 4mg  x 2 and diltiazem 10 mg IV TECHNIQUE: The patient was scanned on a Siemens 962 slice scanner. Gantry rotation speed was 250 msecs. Collimation was 0.6 mm. A 100 kV prospective scan was triggered in the ascending thoracic aorta at 35-75% of the R-R interval. Average HR during the scan was 60 bpm. The 3D data set was interpreted on a dedicated work station using MPR, MIP and VRT modes. A total of 80cc of contrast was used. FINDINGS: Non-cardiac: See separate report from [facility]. Moderate pericardial effusion noted more prominently laterally. There is a filling defect at the tip of the LA appendage. I cannot rule out a thrombus though may just be poor filling of the appendage. Calcium Score: Registers  at 0 Agatston units. Coronary Arteries: Right dominant with no anomalies LM: No plaque or stenosis. LAD system: Mixed plaque without significant stenosis mid LAD. Circumflex system: No plaque or stenosis. RCA system: No plaque or stenosis. Pulmonary veins: All drain to left atrium. RUPV 16 mm x 23 mm RLPV 17 mm x 21 mm LUPV 16 x 22 mm LLPV 5 x 11 mm.  Small vessel but does not appear stenosed. Close proximity to descending thoracic aorta. IMPRESSION: 1.  Pericardial effusion, moderate and predominantly lateral. 2. Filling defect LA appendage. Could be poor filling of the appendage but cannot rule out thrombus. Cannot clear appendage on CT, Gisele Pack need TEE. 3. Coronary artery calcium score 0 Agatston units, low risk for future cardiac events. 4.  No significant coronary disease noted. 5. Pulmonary veins as noted above. Left lower pulmonary vein is very small but does not appear stenosed. Dalton Mclean Electronically Signed   By: Loralie Champagne M.D.   On: 06/21/2017 16:54   Result Date: 06/21/2017 EXAM: OVER-READ INTERPRETATION  CT CHEST The following report is an over-read performed by radiologist Dr. Rolm Baptise of [facility], [place] on 06/21/2017. This over-read does not include interpretation of cardiac or coronary anatomy or pathology. The coronary CTA interpretation by the cardiologist is attached. COMPARISON:  09/18/2015 FINDINGS: Vascular: Cardiomegaly. Moderate pericardial effusion. Visualized aorta is normal caliber. Mediastinum/Nodes: No adenopathy in the lower mediastinum or hila. Lungs/Pleura: Compressive atelectasis in the left lower lobe adjacent to the left heart border is. No effusions. Upper Abdomen: Imaging into the upper abdomen shows no acute findings. Musculoskeletal: Chest wall soft tissues are unremarkable. No acute bony abnormality. IMPRESSION: Cardiomegaly. Moderate size pericardial effusion. Adjacent compressive atelectasis in the left lower lobe. Electronically Signed: By: Rolm Baptise M.D. On: 06/21/2017 12:24    Disposition   Pt is being discharged home today in good condition.  Follow-up Plans & Appointments    Follow-up Information    [place] Follow up on 07/25/2017.   Specialty:  Cardiology Why:  9:30AM Contact information: [address] [id number] mc [address] [place] [address] [phone]       [name], MD Follow up on 09/21/2017.   Specialty:  Cardiology Why:  11:15AM Contact information: [place] [address] [phone]          Discharge Instructions    Diet - low sodium heart healthy   Complete by:  As directed    Increase activity slowly   Complete by:  As directed       Discharge Medications   Allergies as of 06/25/2017      Reactions   Amoxicillin Diarrhea   Has patient had a PCN reaction causing immediate rash, facial/tongue/throat swelling, SOB or lightheadedness with hypotension: no Has patient had a PCN reaction causing severe rash involving mucus membranes or skin necrosis: no Has patient had a PCN reaction that required hospitalization: no Has patient had a PCN reaction occurring within the last 10 years: no If all of the above answers are "NO", then may proceed with Cephalosporin use.   Metoprolol Other (See Comments)   Mouth tastes like mold   Oxaprozin Nausea Only      Medication List    TAKE these medications   acetaminophen 500 MG tablet Commonly known as:  TYLENOL Take 500 mg by mouth every 6 (six) hours as needed for moderate pain or headache.   ALPRAZolam 0.25 MG tablet Commonly known as:  XANAX Take 0.125 mg by mouth 2 (two) times daily as needed for anxiety.   carvedilol 12.5 MG tablet Commonly known as:  COREG Take 1 tablet (12.5 mg total) by mouth 2 (two) times daily with a meal. What changed:    medication strength  how much to take   conjugated estrogens vaginal cream Commonly known as:  PREMARIN Place 1 Applicatorful vaginally daily as needed (irritation).   dorzolamide-timolol 22.3-6.8 MG/ML ophthalmic solution Commonly known as:  COSOPT Place 1 drop into both eyes daily.   doxepin 10 MG capsule Commonly known as:  SINEQUAN Take 10-20 mg by mouth at bedtime as needed (sleep). Take 1 to 2 tablets at bedtime as needed   ELIQUIS 5 MG Tabs  tablet Generic drug:  apixaban Take 5 mg by mouth 2 (two) times daily.   flecainide 50 MG tablet Commonly known as:  TAMBOCOR Take 2 tablets (100 mg total) by mouth 2 (two) times daily.   furosemide 20 MG tablet Commonly known as:  LASIX Take 1 tablet (20 mg total) by mouth daily as needed for fluid or edema (weight gain).   gabapentin 300 MG capsule Commonly known as:  NEURONTIN Take 300 mg by mouth 2 (two) times daily.   hydrochlorothiazide 12.5 MG tablet Commonly known as:  HYDRODIURIL Take 12.5 mg by mouth daily.   latanoprost 0.005 % ophthalmic solution Commonly known as:  XALATAN Place 1 drop into both eyes at bedtime.   losartan 100 MG tablet Commonly known as:  COZAAR Take 100 mg by mouth daily.   multivitamin with minerals Tabs tablet Take 1 tablet by mouth daily.   NON FORMULARY Apply 1 application topically 2 (two) times daily as needed (for eczema). Traimcinolone/[facility] Moist Cream   omeprazole 40 MG capsule Commonly known as:  PRILOSEC Take 40 mg by mouth daily.   ondansetron 4 MG tablet Commonly known as:  ZOFRAN Take 4 mg by mouth every 8 (eight) hours as needed for nausea or vomiting.   oxybutynin 10 MG 24 hr tablet Commonly known as:  DITROPAN-XL Take 10 mg by mouth daily as needed (frequent urination).   PRESCRIPTION MEDICATION Inhale into the lungs at bedtime. CPAP   promethazine 25 MG tablet Commonly known as:  PHENERGAN Take 1 tablet (25 mg total) by mouth every 6 (six) hours as needed for nausea.   STOOL SOFTENER PO Take 1 capsule by mouth 2 (two) times daily as needed (for constipation).   VITAMIN B COMPLEX PO Take 1 capsule by mouth at bedtime.   Vitamin D 2000 units Caps Take 2,000 Units by mouth daily.          Outstanding Labs/Studies   None  Duration of Discharge Encounter   Greater than 30 minutes including physician time.  Signed, Crista Luria Bhagat PA-C 06/25/2017, 9:40 AM  I have seen and examined this patient  with Vin Bhagat.  Agree with above, note added to reflect my findings.  On exam, RRR, no murmurs, lungs clear.  Patient had AF ablation for paroxysmal atrial fibrillation/flutter. Tolerated the procedure well without complaint this morning. Plan for discharge today with follow up in EP clinic.  Jovahn Breit M. Birtha Hatler MD 06/25/2017 9:45 AM

## 2017-06-25 NOTE — Care Management Note (Signed)
Case Management Note  Patient Details  Name: Mary Farrell MRN: [id number] Date of Birth: 14-Aug-1945  Subjective/Objective:  Admitted for Paroxysmal A-fib.  PCP noted                Action/Plan: Patient with discharge orders home today.  Prior to admission patient lived at home with wife.  At discharge patient plans to return to the same living situation.  No discharge needs noted at this time.  Expected Discharge Date:  06/25/17               Expected Discharge Plan:  Home/Self Care Discharge planning Services  CM Consult  Status of Service:  Completed, signed off  Kristen Cardinal, RN 06/25/2017, 11:18 AM

## 2017-06-25 NOTE — Plan of Care (Signed)
  Problem: Education: Goal: Knowledge of General Education information will improve Outcome: Completed/Met Goal: Knowledge of General Education information will improve Outcome: Completed/Met   Problem: Health Behavior/Discharge Planning: Goal: Ability to manage health-related needs will improve Outcome: Completed/Met   Problem: Clinical Measurements: Goal: Ability to maintain clinical measurements within normal limits will improve Outcome: Completed/Met Goal: Will remain free from infection Outcome: Completed/Met Goal: Diagnostic test results will improve Outcome: Completed/Met Goal: Respiratory complications will improve Outcome: Completed/Met Goal: Cardiovascular complication will be avoided Outcome: Completed/Met   Problem: Activity: Goal: Risk for activity intolerance will decrease Outcome: Completed/Met   Problem: Nutrition: Goal: Adequate nutrition will be maintained Outcome: Completed/Met   Problem: Coping: Goal: Level of anxiety will decrease Outcome: Completed/Met   Problem: Elimination: Goal: Will not experience complications related to bowel motility Outcome: Completed/Met Goal: Will not experience complications related to urinary retention Outcome: Completed/Met   Problem: Pain Managment: Goal: General experience of comfort will improve Outcome: Completed/Met   Problem: Safety: Goal: Ability to remain free from injury will improve Outcome: Completed/Met   Problem: Skin Integrity: Goal: Risk for impaired skin integrity will decrease Outcome: Completed/Met

## 2017-06-27 ENCOUNTER — Encounter (HOSPITAL_COMMUNITY): Payer: Self-pay | Admitting: Cardiology

## 2017-06-27 LAB — POCT ACTIVATED CLOTTING TIME: Activated Clotting Time: 147 seconds

## 2017-06-29 MED FILL — Heparin Sod (Porcine)-NaCl IV Soln 1000 Unit/500ML-0.9%: INTRAVENOUS | Qty: 2500 | Status: AC

## 2017-07-04 ENCOUNTER — Other Ambulatory Visit: Payer: Self-pay | Admitting: Cardiovascular Disease

## 2017-07-13 ENCOUNTER — Encounter: Payer: Self-pay | Admitting: Cardiovascular Disease

## 2017-07-13 ENCOUNTER — Ambulatory Visit (INDEPENDENT_AMBULATORY_CARE_PROVIDER_SITE_OTHER): Payer: Medicare Other | Admitting: Cardiovascular Disease

## 2017-07-13 VITALS — BP 112/62 | HR 68 | Ht 60.0 in | Wt 154.4 lb

## 2017-07-13 DIAGNOSIS — I481 Persistent atrial fibrillation: Secondary | ICD-10-CM

## 2017-07-13 DIAGNOSIS — I3139 Other pericardial effusion (noninflammatory): Secondary | ICD-10-CM

## 2017-07-13 DIAGNOSIS — I10 Essential (primary) hypertension: Secondary | ICD-10-CM | POA: Diagnosis not present

## 2017-07-13 DIAGNOSIS — I313 Pericardial effusion (noninflammatory): Secondary | ICD-10-CM | POA: Diagnosis not present

## 2017-07-13 DIAGNOSIS — R0602 Shortness of breath: Secondary | ICD-10-CM

## 2017-07-13 DIAGNOSIS — I4819 Other persistent atrial fibrillation: Secondary | ICD-10-CM

## 2017-07-13 DIAGNOSIS — I483 Typical atrial flutter: Secondary | ICD-10-CM

## 2017-07-13 NOTE — Progress Notes (Signed)
Cardiology Office Note  Date:  07/13/2017   ID:  Mary Farrell, DOB [birth year], MRN [id number]  PCP:  Mary Cruel, MD  Cardiologist:   Mary Latch, MD  Electrophysiologist: Dr. Curt Farrell  No chief complaint on file.   History of Present Illness: Mary Farrell is a 72 y.o. female with moderate aortic regurgitation, mild-moderate mitral stenosis, chronic pericardial effusion, paroxysmal atrial flutter s/p ablation 05/2017, and hypertension who presents for follow up.  Mary Farrell was previously a patient of Dr. Mare Farrell.  She followed up with Mary Farrell on 04/2015 due to shortness of breath that she felt was getting worse.  She was referred for an echo 04/2015 that revealed LVEF 55-60% with moderate AR and mild-moderate MS.  There was also a moderate pericardial effusion but no evidence of tamponade.  She had a heart cath 12/1994 that revealed normal coronaries with mild MS and mild pulmonary hypertension.  She also had a Cardiolite in 2007 that was negative for ischemia.  She was seen in clinic 07/2015 and reported persistent shortness of breath with minimal exertion and chest discomfort.  She was referred for exercise Myoview 08/26/15 that revealed LVEF 60% and no perfusion defects.    Mary Farrell was admitted 08/2015 with atrial flutter.  She had an echo that showed a persistent moderate-severe pericardial effusion but no tamponade.  She was started on amiodarone, which she did not tolerate due to nausea.  She underwent DCCV on 09/22/15 and then was started on flecainide.  She continued to report shortness of breath and was again referred for an echo that revealed LVEF 60-65% with grade 2 diastolic dysfunction, mild aortic regurgitation, and moderate mitral stenosis. She had a moderate pericardial effusion localized to the inferior and inferolateral walls. There was no evidence of tamponade.  Mary Farrell was seen in clinic 9/27 for an acute visit due to atrial fibrillation.  She had an  outpatient DCCV on 10/1 and converted with one shock at 200J.  She developed recurrent atrial fibrillation and was referred to EP.  She underwent afib/flutter ablation with Dr. Curt Farrell on 06/24/17.  Since then she has done well and hasn't had any recurrent atrial arrhythmias.  She is afraid to do too much for fear that it will recur.  She has taken lasix 2-3 times in the last several months.  She has no chest Farrell and her breathing has been stable.  She denies lower extremity edema, orthopnea or PND.     Past Medical History:  Diagnosis Date  . Aortic regurgitation 08/13/2015  . Arthritis    "hands, wrists, back, feet, toes" (06/24/2017)  . Atypical chest Farrell 05/13/2016  . Chest Farrell    remote cath in 1996 with NORMAL coronaries noted  . Exogenous obesity    history of   . GERD (gastroesophageal reflux disease)   . Glaucoma, both eyes   . Headache, migraine    "stopped in my 50's" (09/18/2015)  . History of cardiovascular stress test 2007   showing no ischemia  . Hypertension   . Mitral stenosis with insufficiency   . Murmur    of mitral stenosis and moderate aortic insufficiency      . OSA on CPAP   . Palpitations    occasional  . Personal history of rheumatic heart disease   . SBE (subacute bacterial endocarditis)    prophylaxis  . Sliding hiatal hernia     Past Surgical History:  Procedure Laterality Date  . ABDOMINAL HYSTERECTOMY  1975  . APPENDECTOMY  1977  . ATRIAL FIBRILLATION ABLATION N/A 03/10/2017   Procedure: ATRIAL FIBRILLATION ABLATION;  Surgeon: Mary Haw, MD;  Location: [place] [facility];  Service: Cardiovascular;  Laterality: N/A;  . ATRIAL FIBRILLATION ABLATION  06/24/2017  . ATRIAL FIBRILLATION ABLATION N/A 06/24/2017   Procedure: ATRIAL FIBRILLATION ABLATION;  Surgeon: Mary Haw, MD;  Location: [place] [facility];  Service: Cardiovascular;  Laterality: N/A;  . BUNIONECTOMY Right 2000s  . CARDIAC CATHETERIZATION  01/13/1995   normal  coronary anatomy and mild mitral stenosis and mild pulmonary hypertension  . CARDIOVERSION N/A 09/22/2015   Procedure: CARDIOVERSION;  Surgeon: Mary Pain, MD;  Location: [place];  Service: Cardiovascular;  Laterality: N/A;  . CARDIOVERSION N/A 10/25/2016   Procedure: CARDIOVERSION;  Surgeon: Mary Records, MD;  Location: [facility] ENDOSCOPY;  Service: Cardiovascular;  Laterality: N/A;  . CARDIOVERSION N/A 04/15/2017   Procedure: CARDIOVERSION;  Surgeon: Mary Hector, MD;  Location: [facility] ENDOSCOPY;  Service: Cardiovascular;  Laterality: N/A;  . CARDIOVERSION N/A 05/16/2017   Procedure: CARDIOVERSION;  Surgeon: Mary Casino, MD;  Location: [facility] ENDOSCOPY;  Service: Cardiovascular;  Laterality: N/A;  . CATARACT EXTRACTION W/ INTRAOCULAR LENS  IMPLANT, BILATERAL Bilateral 2013  . LAPAROSCOPIC CHOLECYSTECTOMY  1997  . TEE WITHOUT CARDIOVERSION N/A 06/23/2017   Procedure: TRANSESOPHAGEAL ECHOCARDIOGRAM (TEE);  Surgeon: Mary Records, MD;  Location: [facility] ENDOSCOPY;  Service: Cardiovascular;  Laterality: N/A;  . TONSILLECTOMY AND ADENOIDECTOMY  1990s  . UVULOPALATOPHARYNGOPLASTY, TONSILLECTOMY AND SEPTOPLASTY  1990s     Current Outpatient Medications  Medication Sig Dispense Refill  . acetaminophen (TYLENOL) 500 MG tablet Take 500 mg by mouth every 6 (six) hours as needed for moderate Farrell or headache.    . ALPRAZolam (XANAX) 0.25 MG tablet Take 0.125 mg by mouth 2 (two) times daily as needed for anxiety.     . B Complex Vitamins (VITAMIN B COMPLEX PO) Take 1 capsule by mouth at bedtime.     . carvedilol (COREG) 12.5 MG tablet Take 1 tablet (12.5 mg total) by mouth 2 (two) times daily with a meal. 60 tablet 6  . Cholecalciferol (VITAMIN D) 2000 UNITS CAPS Take 2,000 Units by mouth daily.      Mary Farrell conjugated estrogens (PREMARIN) vaginal cream Place 1 Applicatorful vaginally daily as needed (irritation).     Mary Farrell Calcium (STOOL SOFTENER PO) Take 1 capsule by mouth 2 (two) times daily as needed (for  constipation).     . dorzolamide-timolol (COSOPT) 22.3-6.8 MG/ML ophthalmic solution Place 1 drop into both eyes daily.    Mary Farrell doxepin (SINEQUAN) 10 MG capsule Take 10-20 mg by mouth at bedtime as needed (sleep). Take 1 to 2 tablets at bedtime as needed    . ELIQUIS 5 MG TABS tablet TAKE 1 TABLET BY MOUTH TWO  TIMES DAILY 180 tablet 1  . flecainide (TAMBOCOR) 50 MG tablet Take 2 tablets (100 mg total) by mouth 2 (two) times daily. 120 tablet 3  . furosemide (LASIX) 20 MG tablet Take 1 tablet (20 mg total) by mouth daily as needed for fluid or edema (weight gain). 15 tablet 1  . gabapentin (NEURONTIN) 300 MG capsule Take 300 mg by mouth 2 (two) times daily.     . hydrochlorothiazide (HYDRODIURIL) 12.5 MG tablet Take 12.5 mg by mouth daily.    Mary Farrell latanoprost (XALATAN) 0.005 % ophthalmic solution Place 1 drop into both eyes at bedtime.   6  . losartan (COZAAR) 100 MG tablet Take  100 mg by mouth daily.    . Multiple Vitamin (MULTIVITAMIN WITH MINERALS) TABS tablet Take 1 tablet by mouth daily.    . NON FORMULARY Apply 1 application topically 2 (two) times daily as needed (for eczema). Traimcinolone/[facility] Moist Cream    . omeprazole (PRILOSEC) 40 MG capsule Take 40 mg by mouth daily.     . ondansetron (ZOFRAN) 4 MG tablet Take 4 mg by mouth every 8 (eight) hours as needed for nausea or vomiting.    Mary Farrell oxybutynin (DITROPAN-XL) 10 MG 24 hr tablet Take 10 mg by mouth daily as needed (frequent urination).     Mary Farrell PRESCRIPTION MEDICATION Inhale into the lungs at bedtime. CPAP    . promethazine (PHENERGAN) 25 MG tablet Take 1 tablet (25 mg total) by mouth every 6 (six) hours as needed for nausea. 20 tablet 0   No current facility-administered medications for this visit.     Allergies:   Amoxicillin; Metoprolol; and Oxaprozin    Social History:  The patient  reports that she has never smoked. She has never used smokeless tobacco. She reports that she does not drink alcohol or use drugs.   Family History:  The  patient's  family history includes Diabetes in her brother; Heart attack in her mother; Heart failure in her maternal grandfather; Hypertension in her brother; Kidney disease in her brother.    ROS:  Please see the history of present illness.   Otherwise, review of systems are positive for none.   All other systems are reviewed and negative.    PHYSICAL EXAM: VS:  BP 112/62   Pulse 68   Ht 5' (1.524 m)   Wt 154 lb 6.4 oz (70 kg)   BMI 30.15 kg/m  , BMI Body mass index is 30.15 kg/m. GENERAL:  Well appearing HEENT: Pupils equal round and reactive, fundi not visualized, oral mucosa unremarkable NECK:  No jugular venous distention, waveform within normal limits, carotid upstroke brisk and symmetric, no bruits LUNGS:  Clear to auscultation bilaterally HEART:  RRR.  PMI not displaced or sustained,S1 and S2 within normal limits, + S3, no S4, no clicks, no rubs, III/VI diastolic murmur at the LUSB.  III/VI holosystolic murmur at the apex.   ABD:  Flat, positive bowel sounds normal in frequency in pitch, no bruits, no rebound, no guarding, no midline pulsatile mass, no hepatomegaly, no splenomegaly EXT:  2 plus pulses throughout, no edema, no cyanosis no clubbing SKIN:  No rashes no nodules NEURO:  Cranial nerves II through XII grossly intact, motor grossly intact throughout PSYCH:  Cognitively intact, oriented to person place and time   EKG:  EKG is not ordered today. The ekg ordered 05/07/15 demonstraSinus bradycardia rate 57 bpm. 05/13/16: Sinus rhythm. Rate 66 bpm. 09/09/16: Sinus rhythm. Rate 64 bpm.   10/26/16: Sinus rhythm.  Rate 74 bpm.  Exercise Myoveiw 08/26/15:  The left ventricular ejection fraction is normal (55-65%).  Nuclear stress EF: 60%.  Blood pressure demonstrated a hypertensive response to exercise.  Upsloping ST segment depression ST segment depression of 1 mm was noted during stress in the II, III, V6, V5 and aVF leads.  This is a low risk study.   No reversible  ischemia. LVEF 60% with normal wall motion. Fair exercise tolerance. No chest Farrell. This is a low risk study.  Echo 05/28/16: Study Conclusions  - Left ventricle: The cavity size was normal. There was mild   concentric hypertrophy. Systolic function was normal. The   estimated ejection  fraction was in the range of 60% to 65%. Wall   motion was normal; there were no regional wall motion   abnormalities. Features are consistent with a pseudonormal left   ventricular filling pattern, with concomitant abnormal relaxation   and increased filling pressure (grade 2 diastolic dysfunction).   Doppler parameters are consistent with elevated ventricular   end-diastolic filling pressure. - Aortic valve: Trileaflet; mildly thickened leaflets. There was   mild regurgitation. - Aortic root: The aortic root was normal in size. - Mitral valve: Calcified annulus. Moderately thickened, moderately   calcified leaflets . The findings are consistent with moderate   stenosis. There was mild regurgitation. Mean gradient (D): 6 mm   Hg. Peak gradient (D): 12 mm Hg. - Left atrium: The atrium was severely dilated. - Right ventricle: Systolic function was normal. - Tricuspid valve: There was mild regurgitation. - Inferior vena cava: The vessel was normal in size. - Pericardium, extracardiac: Features were not consistent with   tamponade physiology.  Impressions:  - LVEF 60-65%.   There was moderate localized pericardial effusion around the   inferior and inferolateral walls unchanged from prior exam on   10/22/15. No evidence for tamponade.   Mitral valve is thickened and calcified, appears rheumatic with   anterior leaflet bowing. There is moderate mitral stenosis and   midl regurgitation.  Recent Labs: 03/21/2017: Magnesium 1.9 05/15/2017: B Natriuretic Peptide 342.8 06/14/2017: BUN 10; Creatinine, Ser 0.77; Hemoglobin 12.9; Platelets 209; Potassium 4.3; Sodium 140   07/05/16: Total cholesterol 167,  triglycerides 159, HDL 47, LDL 88 TSH 2.2 Sodium 141, potassium 3.8, BUN 14, creatinine 0.77 AST 13, ALT 14  Lipid Panel No results found for: CHOL, TRIG, HDL, CHOLHDL, VLDL, LDLCALC, LDLDIRECT    Wt Readings from Last 3 Encounters:  07/13/17 154 lb 6.4 oz (70 kg)  06/24/17 156 lb (70.8 kg)  06/14/17 157 lb 6.4 oz (71.4 kg)     ASSESSMENT AND PLAN:  # Persitent atrial flutter/fibrillation: Doing well post-ablation.  No recurrent episodes.  Continue carvedilol, flecainide and Eliquis.  Suspect she will stop flecainide at some point.  # Atypical chest Farrell: # Shortness of breath: # Pericardial effusion:   Stable. No tamponade noted on echo 05/2016.  Negative stress in 2017 and symptoms are unchanged.   # Mild-moderate mitral stenosis: # Moderate AR: # Rheumatic heart disease: Mean gradient 6 mmHg 05/2016.  # Hypertension: BP well-controlled on carvedilol, HCTZ, and losartan.     Current medicines are reviewed at length with the patient today.  The patient does not have concerns regarding medicines.  The following changes have been made:  None  Labs/ tests ordered today include:   No orders of the defined types were placed in this encounter.    Disposition:   FU with Araseli Sherry C. Oval Linsey, MD, [facility] in 6 months    Signed, Izela Altier C. Oval Linsey, MD, [facility]  07/13/2017 1:21 PM    [facility]

## 2017-07-13 NOTE — Patient Instructions (Signed)
Medication Instructions:  Your physician recommends that you continue on your current medications as directed. Please refer to the Current Medication list given to you today.  Labwork: none  Testing/Procedures: none  Follow-Up: Your physician wants you to follow-up in: 6 months  You will receive a reminder letter in the mail two months in advance. If you don't receive a letter, please call our office to schedule the follow-up appointment.  If you need a refill on your cardiac medications before your next appointment, please call your pharmacy.  

## 2017-07-25 ENCOUNTER — Encounter (HOSPITAL_COMMUNITY): Payer: Self-pay | Admitting: Nurse Practitioner

## 2017-07-25 ENCOUNTER — Ambulatory Visit (HOSPITAL_COMMUNITY)
Admission: RE | Admit: 2017-07-25 | Discharge: 2017-07-25 | Disposition: A | Discharge status: Home / Self Care | Payer: Medicare Other | Source: Ambulatory Visit | Attending: Nurse Practitioner | Admitting: Nurse Practitioner

## 2017-07-25 VITALS — BP 114/64 | HR 66 | Ht 60.0 in | Wt 157.0 lb

## 2017-07-25 DIAGNOSIS — I4819 Other persistent atrial fibrillation: Secondary | ICD-10-CM

## 2017-07-25 DIAGNOSIS — Z8249 Family history of ischemic heart disease and other diseases of the circulatory system: Secondary | ICD-10-CM | POA: Insufficient documentation

## 2017-07-25 DIAGNOSIS — I10 Essential (primary) hypertension: Secondary | ICD-10-CM | POA: Insufficient documentation

## 2017-07-25 DIAGNOSIS — Z7901 Long term (current) use of anticoagulants: Secondary | ICD-10-CM | POA: Diagnosis not present

## 2017-07-25 DIAGNOSIS — I481 Persistent atrial fibrillation: Secondary | ICD-10-CM | POA: Diagnosis not present

## 2017-07-25 DIAGNOSIS — G4733 Obstructive sleep apnea (adult) (pediatric): Secondary | ICD-10-CM | POA: Insufficient documentation

## 2017-07-25 DIAGNOSIS — I351 Nonrheumatic aortic (valve) insufficiency: Secondary | ICD-10-CM | POA: Diagnosis not present

## 2017-07-25 DIAGNOSIS — K449 Diaphragmatic hernia without obstruction or gangrene: Secondary | ICD-10-CM | POA: Insufficient documentation

## 2017-07-25 DIAGNOSIS — I4510 Unspecified right bundle-branch block: Secondary | ICD-10-CM | POA: Diagnosis not present

## 2017-07-25 DIAGNOSIS — Z880 Allergy status to penicillin: Secondary | ICD-10-CM | POA: Insufficient documentation

## 2017-07-25 DIAGNOSIS — K219 Gastro-esophageal reflux disease without esophagitis: Secondary | ICD-10-CM | POA: Insufficient documentation

## 2017-07-25 DIAGNOSIS — H409 Unspecified glaucoma: Secondary | ICD-10-CM | POA: Insufficient documentation

## 2017-07-25 DIAGNOSIS — Z79899 Other long term (current) drug therapy: Secondary | ICD-10-CM | POA: Insufficient documentation

## 2017-07-25 DIAGNOSIS — Z9889 Other specified postprocedural states: Secondary | ICD-10-CM | POA: Insufficient documentation

## 2017-07-25 NOTE — Progress Notes (Signed)
Primary Care Physician: Lawerance Cruel, MD Referring Physician:Dr. Myles Lipps Mary Farrell is a 72 y.o. female with a h/o persistent afib that had her first ablation 03/10/17 but with issues with ongoing afib and heart failure. Sje underwent her second ablation 06/24/17 and has done well since then without any noted heart irregularity. She continues on flecainide. No swallowing or goin issues.  Today, she denies symptoms of palpitations, chest pain, shortness of breath, orthopnea, PND, lower extremity edema, dizziness, presyncope, syncope, or neurologic sequela. The patient is tolerating medications without difficulties and is otherwise without complaint today.   Past Medical History:  Diagnosis Date  . Aortic regurgitation 08/13/2015  . Arthritis    "hands, wrists, back, feet, toes" (06/24/2017)  . Atypical chest pain 05/13/2016  . Chest pain    remote cath in 1996 with NORMAL coronaries noted  . Exogenous obesity    history of   . GERD (gastroesophageal reflux disease)   . Glaucoma, both eyes   . Headache, migraine    "stopped in my 50's" (09/18/2015)  . History of cardiovascular stress test 2007   showing no ischemia  . Hypertension   . Mitral stenosis with insufficiency   . Murmur    of mitral stenosis and moderate aortic insufficiency      . OSA on CPAP   . Palpitations    occasional  . Personal history of rheumatic heart disease   . SBE (subacute bacterial endocarditis)    prophylaxis  . Sliding hiatal hernia    Past Surgical History:  Procedure Laterality Date  . ABDOMINAL HYSTERECTOMY  1975  . APPENDECTOMY  1977  . ATRIAL FIBRILLATION ABLATION N/A 03/10/2017   Procedure: ATRIAL FIBRILLATION ABLATION;  Surgeon: [name], MD;  Location: [place] [facility];  Service: Cardiovascular;  Laterality: N/A;  . ATRIAL FIBRILLATION ABLATION  06/24/2017  . ATRIAL FIBRILLATION ABLATION N/A 06/24/2017   Procedure: ATRIAL FIBRILLATION ABLATION;  Surgeon: [name], MD;  Location: [place] [facility];  Service: Cardiovascular;  Laterality: N/A;  . BUNIONECTOMY Right 2000s  . CARDIAC CATHETERIZATION  01/13/1995   normal coronary anatomy and mild mitral stenosis and mild pulmonary hypertension  . CARDIOVERSION N/A 09/22/2015   Procedure: CARDIOVERSION;  Surgeon: Jerline Pain, MD;  Location: [place];  Service: Cardiovascular;  Laterality: N/A;  . CARDIOVERSION N/A 10/25/2016   Procedure: CARDIOVERSION;  Surgeon: Fay Records, MD;  Location: [facility] ENDOSCOPY;  Service: Cardiovascular;  Laterality: N/A;  . CARDIOVERSION N/A 04/15/2017   Procedure: CARDIOVERSION;  Surgeon: Josue Hector, MD;  Location: [facility] ENDOSCOPY;  Service: Cardiovascular;  Laterality: N/A;  . CARDIOVERSION N/A 05/16/2017   Procedure: CARDIOVERSION;  Surgeon: Pixie Casino, MD;  Location: [facility] ENDOSCOPY;  Service: Cardiovascular;  Laterality: N/A;  . CATARACT EXTRACTION W/ INTRAOCULAR LENS  IMPLANT, BILATERAL Bilateral 2013  . LAPAROSCOPIC CHOLECYSTECTOMY  1997  . TEE WITHOUT CARDIOVERSION N/A 06/23/2017   Procedure: TRANSESOPHAGEAL ECHOCARDIOGRAM (TEE);  Surgeon: Fay Records, MD;  Location: [facility] ENDOSCOPY;  Service: Cardiovascular;  Laterality: N/A;  . TONSILLECTOMY AND ADENOIDECTOMY  1990s  . UVULOPALATOPHARYNGOPLASTY, TONSILLECTOMY AND SEPTOPLASTY  1990s    Current Outpatient Medications  Medication Sig Dispense Refill  . acetaminophen (TYLENOL) 500 MG tablet Take 500 mg by mouth every 6 (six) hours as needed for moderate pain or headache.    . ALPRAZolam (XANAX) 0.25 MG tablet Take 0.125 mg by mouth 2 (two) times daily as needed for anxiety.     Marland Kitchen  B Complex Vitamins (VITAMIN B COMPLEX PO) Take 1 capsule by mouth at bedtime.     . carvedilol (COREG) 12.5 MG tablet Take 1 tablet (12.5 mg total) by mouth 2 (two) times daily with a meal. 60 tablet 6  . Cholecalciferol (VITAMIN D) 2000 UNITS CAPS Take 2,000 Units by mouth daily.      Marland Kitchen conjugated estrogens (PREMARIN) vaginal cream  Place 1 Applicatorful vaginally daily as needed (irritation).     Mariane Baumgarten Calcium (STOOL SOFTENER PO) Take 1 capsule by mouth 2 (two) times daily as needed (for constipation).     . dorzolamide-timolol (COSOPT) 22.3-6.8 MG/ML ophthalmic solution Place 1 drop into both eyes daily.    Marland Kitchen doxepin (SINEQUAN) 10 MG capsule Take 10-20 mg by mouth at bedtime as needed (sleep). Take 1 to 2 tablets at bedtime as needed    . ELIQUIS 5 MG TABS tablet TAKE 1 TABLET BY MOUTH TWO  TIMES DAILY 180 tablet 1  . flecainide (TAMBOCOR) 50 MG tablet Take 2 tablets (100 mg total) by mouth 2 (two) times daily. 120 tablet 3  . furosemide (LASIX) 20 MG tablet Take 1 tablet (20 mg total) by mouth daily as needed for fluid or edema (weight gain). 15 tablet 1  . gabapentin (NEURONTIN) 300 MG capsule Take 300 mg by mouth 2 (two) times daily.     . hydrochlorothiazide (HYDRODIURIL) 12.5 MG tablet Take 12.5 mg by mouth daily.    Marland Kitchen latanoprost (XALATAN) 0.005 % ophthalmic solution Place 1 drop into both eyes at bedtime.   6  . losartan (COZAAR) 100 MG tablet Take 100 mg by mouth daily.    . Multiple Vitamin (MULTIVITAMIN WITH MINERALS) TABS tablet Take 1 tablet by mouth daily.    . NON FORMULARY Apply 1 application topically 2 (two) times daily as needed (for eczema). Traimcinolone/[facility] Moist Cream    . omeprazole (PRILOSEC) 40 MG capsule Take 40 mg by mouth daily.     Marland Kitchen oxybutynin (DITROPAN-XL) 10 MG 24 hr tablet Take 10 mg by mouth daily as needed (frequent urination).     Marland Kitchen PRESCRIPTION MEDICATION Inhale into the lungs at bedtime. CPAP    . ondansetron (ZOFRAN) 4 MG tablet Take 4 mg by mouth every 8 (eight) hours as needed for nausea or vomiting.    . promethazine (PHENERGAN) 25 MG tablet Take 1 tablet (25 mg total) by mouth every 6 (six) hours as needed for nausea. (Patient not taking: Reported on 07/25/2017) 20 tablet 0   No current facility-administered medications for this encounter.     Allergies  Allergen Reactions    . Amoxicillin Diarrhea    Has patient had a PCN reaction causing immediate rash, facial/tongue/throat swelling, SOB or lightheadedness with hypotension: no Has patient had a PCN reaction causing severe rash involving mucus membranes or skin necrosis: no Has patient had a PCN reaction that required hospitalization: no Has patient had a PCN reaction occurring within the last 10 years: no If all of the above answers are "NO", then may proceed with Cephalosporin use.   . Metoprolol Other (See Comments)    Mouth tastes like mold  . Oxaprozin Nausea Only    Social History   Socioeconomic History  . Marital status: Married    Spouse name: Not on file  . Number of children: Not on file  . Years of education: Not on file  . Highest education level: Not on file  Occupational History  . Occupation: Retired  Scientific laboratory technician  .  Financial resource strain: Not on file  . Food insecurity:    Worry: Not on file    Inability: Not on file  . Transportation needs:    Medical: Not on file    Non-medical: Not on file  Tobacco Use  . Smoking status: Never Smoker  . Smokeless tobacco: Never Used  Substance and Sexual Activity  . Alcohol use: No  . Drug use: No  . Sexual activity: Not Currently  Lifestyle  . Physical activity:    Days per week: Not on file    Minutes per session: Not on file  . Stress: Not on file  Relationships  . Social connections:    Talks on phone: Not on file    Gets together: Not on file    Attends religious service: Not on file    Active member of club or organization: Not on file    Attends meetings of clubs or organizations: Not on file    Relationship status: Not on file  . Intimate partner violence:    Fear of current or ex partner: Not on file    Emotionally abused: Not on file    Physically abused: Not on file    Forced sexual activity: Not on file  Other Topics Concern  . Not on file  Social History Narrative   Lives with husband in [place].     Family History  Problem Relation Age of Onset  . Heart attack Mother   . Hypertension Brother   . Kidney disease Brother   . Diabetes Brother   . Heart failure Maternal Grandfather     ROS- All systems are reviewed and negative except as per the HPI above  Physical Exam: Vitals:   07/25/17 1151  BP: 114/64  Pulse: 66  Weight: 157 lb (71.2 kg)  Height: 5' (1.524 m)   Wt Readings from Last 3 Encounters:  07/25/17 157 lb (71.2 kg)  07/13/17 154 lb 6.4 oz (70 kg)  06/24/17 156 lb (70.8 kg)    Labs: Lab Results  Component Value Date   NA 140 06/14/2017   K 4.3 06/14/2017   CL 96 06/14/2017   CO2 26 06/14/2017   GLUCOSE 89 06/14/2017   BUN 10 06/14/2017   CREATININE 0.77 06/14/2017   CALCIUM 9.5 06/14/2017   MG 1.9 03/21/2017   Lab Results  Component Value Date   INR 1.04 09/18/2015   No results found for: CHOL, HDL, LDLCALC, TRIG   GEN- The patient is well appearing, alert and oriented x 3 today.   Head- normocephalic, atraumatic Eyes-  Sclera clear, conjunctiva pink Ears- hearing intact Oropharynx- clear Neck- supple, no JVP Lymph- no cervical lymphadenopathy Lungs- Clear to ausculation bilaterally, normal work of breathing Heart- Regular rate and rhythm, no murmurs, rubs or gallops, PMI not laterally displaced GI- soft, NT, ND, + BS Extremities- no clubbing, cyanosis, or edema MS- no significant deformity or atrophy Skin- no rash or lesion Psych- euthymic mood, full affect Neuro- strength and sensation are intact  EKG-Sinus rhythm at 66 bpm, pr int 252 ms, qrs int 102 ms, qtc 454 ms Epic records reviewed    Assessment and Plan: 1. Persistent afib Now staying in SR after second ablation Continue flecainide at 100 mg bid Continue eliquis 5 mg bid for a [id number] score of at least 3, without interruption for the 3 month healing period  F/u with Dr. Curt Bears 8/28, as scheduled   Geroge Baseman. [place], [facility] [address] [place], [place] [address] [phone]

## 2017-08-13 ENCOUNTER — Other Ambulatory Visit: Payer: Self-pay | Admitting: Cardiovascular Disease

## 2017-08-15 NOTE — Telephone Encounter (Signed)
Rx sent to pharmacy   

## 2017-08-25 ENCOUNTER — Ambulatory Visit: Payer: Medicare Other | Admitting: Cardiology

## 2017-09-21 ENCOUNTER — Encounter: Payer: Self-pay | Admitting: Cardiology

## 2017-09-21 ENCOUNTER — Ambulatory Visit (INDEPENDENT_AMBULATORY_CARE_PROVIDER_SITE_OTHER): Payer: Medicare Other | Admitting: Cardiology

## 2017-09-21 VITALS — BP 116/66 | HR 64 | Ht 60.0 in | Wt 161.2 lb

## 2017-09-21 DIAGNOSIS — I483 Typical atrial flutter: Secondary | ICD-10-CM | POA: Diagnosis not present

## 2017-09-21 DIAGNOSIS — I10 Essential (primary) hypertension: Secondary | ICD-10-CM | POA: Diagnosis not present

## 2017-09-21 DIAGNOSIS — I3139 Other pericardial effusion (noninflammatory): Secondary | ICD-10-CM

## 2017-09-21 DIAGNOSIS — I481 Persistent atrial fibrillation: Secondary | ICD-10-CM | POA: Diagnosis not present

## 2017-09-21 DIAGNOSIS — I313 Pericardial effusion (noninflammatory): Secondary | ICD-10-CM | POA: Diagnosis not present

## 2017-09-21 DIAGNOSIS — I4819 Other persistent atrial fibrillation: Secondary | ICD-10-CM

## 2017-09-21 NOTE — Patient Instructions (Addendum)
Medication Instructions:  Your physician recommends that you continue on your current medications as directed. Please refer to the Current Medication list given to you today.  Labwork: None ordered.  Testing/Procedures: None ordered.  Follow-Up: Your physician wants you to follow-up in: 3 months with Dr. Camnitz.  Any Other Special Instructions Will Be Listed Below (If Applicable).  If you need a refill on your cardiac medications before your next appointment, please call your pharmacy.    

## 2017-09-21 NOTE — Progress Notes (Signed)
Electrophysiology Office Note   Date:  09/21/2017   ID:  Mary, Farrell 1945-09-17, MRN [id number]  PCP:  Mary Cruel, MD  Cardiologist:  Mary Farrell Primary Electrophysiologist:  Mary Benecke Meredith Leeds, MD    No chief complaint on file.    History of Present Illness: Mary Farrell is a 72 y.o. female who is being seen today for the evaluation of atrial fibrillation at the request of Mary Cruel, MD. Presenting today for electrophysiology evaluation. She has a history of moderate aortic regurgitation, mild to moderate mitral stenosis, chronic pericardial effusion, paroxysmal atrial flutter, and hypertension. She had a heart catheterization in 1996 revealed normal coronaries with mild MS and mild pulmonary hypertension.  She was admitted 09/14/2015 with atrial flutter. She was started on amiodarone but did not tolerate it due to nausea.  He has had multiple cardioversions in the past.  She had an atrial fibrillation/flutter ablation 03/10/2017.  She has had much more in the way of atrial fibrillation and atrial flutter.  She has symptoms of weakness, fatigue, and shortness of breath.  Today, denies symptoms of palpitations, chest pain, shortness of breath, orthopnea, PND, lower extremity edema, claudication, dizziness, presyncope, syncope, bleeding, or neurologic sequela. The patient is tolerating medications without difficulties.  Overall she is doing well.  She has noted no further episodes of atrial fibrillation or atrial flutter since her ablation.  She has had no major issues.   Past Medical History:  Diagnosis Date  . Anxiety 06/29/2012  . Aortic regurgitation 08/13/2015  . Arthritis    "hands, wrists, back, feet, toes" (06/24/2017)  . Atrial flutter ([place]) 09/18/2015  . Atypical chest pain 05/13/2016  . Chest pain    remote cath in 1996 with NORMAL coronaries noted  . Dyspnea 10/30/2010  . Essential hypertension 09/05/2013  . Exogenous obesity    history of   . GERD  (gastroesophageal reflux disease)   . Glaucoma, both eyes   . Headache, migraine    "stopped in my 50's" (09/18/2015)  . History of cardiovascular stress test 2007   showing no ischemia  . Hypertension   . Mitral stenosis and aortic insufficiency 06/23/2010  . Mitral stenosis with insufficiency   . Murmur    of mitral stenosis and moderate aortic insufficiency      . OSA on CPAP   . Palpitations    occasional  . Paroxysmal A-fib ([place]) 06/24/2017  . Pericardial effusion 09/18/2015  . Persistent atrial fibrillation ([place]) 03/10/2017  . Personal history of rheumatic heart disease   . SBE (subacute bacterial endocarditis)    prophylaxis  . Sliding hiatal hernia    Past Surgical History:  Procedure Laterality Date  . ABDOMINAL HYSTERECTOMY  1975  . APPENDECTOMY  1977  . ATRIAL FIBRILLATION ABLATION N/A 03/10/2017   Procedure: ATRIAL FIBRILLATION ABLATION;  Surgeon: [name], MD;  Location: [facility];  Service: Cardiovascular;  Laterality: N/A;  . ATRIAL FIBRILLATION ABLATION  06/24/2017  . ATRIAL FIBRILLATION ABLATION N/A 06/24/2017   Procedure: ATRIAL FIBRILLATION ABLATION;  Surgeon: [name], MD;  Location: [facility];  Service: Cardiovascular;  Laterality: N/A;  . BUNIONECTOMY Right 2000s  . CARDIAC CATHETERIZATION  01/13/1995   normal coronary anatomy and mild mitral stenosis and mild pulmonary hypertension  . CARDIOVERSION N/A 09/22/2015   Procedure: CARDIOVERSION;  Surgeon: Jerline Pain, MD;  Location: [facility] ENDOSCOPY;  Service: Cardiovascular;  Laterality: N/A;  . CARDIOVERSION N/A 10/25/2016   Procedure: CARDIOVERSION;  Surgeon: Fay Records, MD;  Location: [facility] ENDOSCOPY;  Service: Cardiovascular;  Laterality: N/A;  . CARDIOVERSION N/A 04/15/2017   Procedure: CARDIOVERSION;  Surgeon: Josue Hector, MD;  Location: [facility] ENDOSCOPY;  Service: Cardiovascular;  Laterality: N/A;  . CARDIOVERSION N/A 05/16/2017   Procedure: CARDIOVERSION;  Surgeon:  Pixie Casino, MD;  Location: [facility] ENDOSCOPY;  Service: Cardiovascular;  Laterality: N/A;  . CATARACT EXTRACTION W/ INTRAOCULAR LENS  IMPLANT, BILATERAL Bilateral 2013  . LAPAROSCOPIC CHOLECYSTECTOMY  1997  . TEE WITHOUT CARDIOVERSION N/A 06/23/2017   Procedure: TRANSESOPHAGEAL ECHOCARDIOGRAM (TEE);  Surgeon: Fay Records, MD;  Location: [facility] ENDOSCOPY;  Service: Cardiovascular;  Laterality: N/A;  . TONSILLECTOMY AND ADENOIDECTOMY  1990s  . UVULOPALATOPHARYNGOPLASTY, TONSILLECTOMY AND SEPTOPLASTY  1990s     Current Outpatient Medications  Medication Sig Dispense Refill  . acetaminophen (TYLENOL) 500 MG tablet Take 500 mg by mouth every 6 (six) hours as needed for moderate pain or headache.    . ALPRAZolam (XANAX) 0.25 MG tablet Take 0.125 mg by mouth 2 (two) times daily as needed for anxiety.     . B Complex Vitamins (VITAMIN B COMPLEX PO) Take 1 capsule by mouth at bedtime.     . carvedilol (COREG) 12.5 MG tablet Take 1 tablet (12.5 mg total) by mouth 2 (two) times daily with a meal. 60 tablet 6  . Cholecalciferol (VITAMIN D) 2000 UNITS CAPS Take 2,000 Units by mouth daily.      Marland Kitchen conjugated estrogens (PREMARIN) vaginal cream Place 1 Applicatorful vaginally daily as needed (irritation).     Mariane Baumgarten Calcium (STOOL SOFTENER PO) Take 1 capsule by mouth 2 (two) times daily as needed (for constipation).     . dorzolamide-timolol (COSOPT) 22.3-6.8 MG/ML ophthalmic solution Place 1 drop into both eyes daily.    Marland Kitchen doxepin (SINEQUAN) 10 MG capsule Take 10-20 mg by mouth at bedtime as needed (sleep). Take 1 to 2 tablets at bedtime as needed    . ELIQUIS 5 MG TABS tablet TAKE 1 TABLET BY MOUTH TWO  TIMES DAILY 180 tablet 1  . flecainide (TAMBOCOR) 50 MG tablet Take 2 tablets (100 mg total) by mouth 2 (two) times daily. 120 tablet 3  . furosemide (LASIX) 20 MG tablet Take 1 tablet (20 mg total) by mouth daily as needed for fluid or edema (weight gain). 15 tablet 1  . gabapentin (NEURONTIN) 300 MG  capsule Take 300 mg by mouth 2 (two) times daily.     . hydrochlorothiazide (HYDRODIURIL) 12.5 MG tablet Take 12.5 mg by mouth daily.    Marland Kitchen latanoprost (XALATAN) 0.005 % ophthalmic solution Place 1 drop into both eyes at bedtime.   6  . losartan (COZAAR) 100 MG tablet TAKE 1 TABLET BY MOUTH  DAILY 90 tablet 0  . Multiple Vitamin (MULTIVITAMIN WITH MINERALS) TABS tablet Take 1 tablet by mouth daily.    . NON FORMULARY Apply 1 application topically 2 (two) times daily as needed (for eczema). Traimcinolone/[employer] Moist Cream    . omeprazole (PRILOSEC) 40 MG capsule Take 40 mg by mouth daily.     . ondansetron (ZOFRAN) 4 MG tablet Take 4 mg by mouth every 8 (eight) hours as needed for nausea or vomiting.    Marland Kitchen oxybutynin (DITROPAN-XL) 10 MG 24 hr tablet Take 10 mg by mouth daily as needed (frequent urination).     Marland Kitchen PRESCRIPTION MEDICATION Inhale into the lungs at bedtime. CPAP    . promethazine (PHENERGAN) 25 MG tablet Take 1 tablet (25 mg  total) by mouth every 6 (six) hours as needed for nausea. 20 tablet 0   No current facility-administered medications for this visit.     Allergies:   Amoxicillin; Metoprolol; and Oxaprozin   Social History:  The patient  reports that she has never smoked. She has never used smokeless tobacco. She reports that she does not drink alcohol or use drugs.   Family History:  The patient's family history includes Diabetes in her brother; Heart attack in her mother; Heart failure in her maternal grandfather; Hypertension in her brother; Kidney disease in her brother.    ROS:  Please see the history of present illness.   Otherwise, review of systems is positive for nausea, anxiety, easy bruising.   All other systems are reviewed and negative.   PHYSICAL EXAM: VS:  BP 116/66   Pulse 64   Ht 5' (1.524 m)   Wt 161 lb 3.2 oz (73.1 kg)   SpO2 93%   BMI 31.48 kg/m  , BMI Body mass index is 31.48 kg/m. GEN: Well nourished, well developed, in no acute distress  HEENT:  normal  Neck: no JVD, carotid bruits, or masses Cardiac: RRR; no murmurs, rubs, or gallops,no edema  Respiratory:  clear to auscultation bilaterally, normal work of breathing GI: soft, nontender, nondistended, + BS MS: no deformity or atrophy  Skin: warm and dry Neuro:  Strength and sensation are intact Psych: euthymic mood, full affect  EKG:  EKG is ordered today. Personal review of the ekg ordered shows sinus rhythm, first-degree AV block, rate 64  Recent Labs: 03/21/2017: Magnesium 1.9 05/15/2017: B Natriuretic Peptide 342.8 06/14/2017: BUN 10; Creatinine, Ser 0.77; Hemoglobin 12.9; Platelets 209; Potassium 4.3; Sodium 140    Lipid Panel  No results found for: CHOL, TRIG, HDL, CHOLHDL, VLDL, LDLCALC, LDLDIRECT   Wt Readings from Last 3 Encounters:  09/21/17 161 lb 3.2 oz (73.1 kg)  07/25/17 157 lb (71.2 kg)  07/13/17 154 lb 6.4 oz (70 kg)      Other studies Reviewed: Additional studies/ records that were reviewed today include: TTE 05/28/16  Review of the above records today demonstrates:  - Left ventricle: The cavity size was normal. There was mild   concentric hypertrophy. Systolic function was normal. The   estimated ejection fraction was in the range of 60% to 65%. Wall   motion was normal; there were no regional wall motion   abnormalities. Features are consistent with a pseudonormal left   ventricular filling pattern, with concomitant abnormal relaxation   and increased filling pressure (grade 2 diastolic dysfunction).   Doppler parameters are consistent with elevated ventricular   end-diastolic filling pressure. - Aortic valve: Trileaflet; mildly thickened leaflets. There was   mild regurgitation. - Aortic root: The aortic root was normal in size. - Mitral valve: Calcified annulus. Moderately thickened, moderately   calcified leaflets . The findings are consistent with moderate   stenosis. There was mild regurgitation. Mean gradient (D): 6 mm   Hg. Peak gradient  (D): 12 mm Hg. - Left atrium: The atrium was severely dilated. - Right ventricle: Systolic function was normal. - Tricuspid valve: There was mild regurgitation. - Inferior vena cava: The vessel was normal in size. - Pericardium, extracardiac: Features were not consistent with   tamponade physiology.   ASSESSMENT AND PLAN:  1.  Persistent atrial fibrillation/flutter: This post ablation 03/10/2017 with repeat ablation 06/24/2017.  On Eliquis.  Mains in sinus rhythm and has had no further palpitations since her repeat ablation.  No changes.  This patients CHA2DS2-VASc Score and unadjusted Ischemic Stroke Rate (% per year) is equal to 3.2 % stroke rate/year from a score of 3  Above score calculated as 1 point each if present [CHF, HTN, DM, Vascular=MI/PAD/Aortic Plaque, Age if 65-74, or Female] Above score calculated as 2 points each if present [Age > 75, or Stroke/TIA/TE]   2. Pericardial effusion: Stable without tamponade physiology  3. Hypertension: Well-controlled today    Current medicines are reviewed at length with the patient today.   The patient does not have concerns regarding her medicines.  The following changes were made today: None  Labs/ tests ordered today include:  Orders Placed This Encounter  Procedures  . EKG 12-Lead     Disposition:   FU with Marleta Lapierre 3 months  Signed, Tamsen Reist Meredith Leeds, MD  09/21/2017 11:39 AM     [facility] [address] [place] [address] [phone] (office) [phone] (fax)

## 2017-11-30 ENCOUNTER — Other Ambulatory Visit: Payer: Self-pay | Admitting: Cardiology

## 2017-11-30 ENCOUNTER — Other Ambulatory Visit: Payer: Self-pay | Admitting: Cardiovascular Disease

## 2017-11-30 NOTE — Telephone Encounter (Signed)
Eliquis 5mg  refill request received; pt is 72 yrs old, wt-73.1kg, Crea-0.77 on 06/14/17, last seen by Dr. Curt Bears on 09/21/17; will send in refill to requested pharmacy.

## 2017-12-01 ENCOUNTER — Other Ambulatory Visit: Payer: Self-pay | Admitting: Cardiovascular Disease

## 2017-12-26 NOTE — Progress Notes (Signed)
Electrophysiology Office Note   Date:  12/27/2017   ID:  Mary PETTI, DOB 10/17/1945, MRN [id number]  PCP:  Lawerance Cruel, MD  Cardiologist:  Oval Linsey Primary Electrophysiologist:  Jarielys Girardot Meredith Leeds, MD    No chief complaint on file.    History of Present Illness: Mary Farrell is a 72 y.o. female who is being seen today for the evaluation of atrial fibrillation at the request of Lawerance Cruel, MD. Presenting today for electrophysiology evaluation. She has a history of moderate aortic regurgitation, mild to moderate mitral stenosis, chronic pericardial effusion, paroxysmal atrial flutter, and hypertension. She had a heart catheterization in 1996 revealed normal coronaries with mild MS and mild pulmonary hypertension.  She was admitted 09/14/2015 with atrial flutter. She was started on amiodarone but did not tolerate it due to nausea.  He has had multiple cardioversions in the past.  She had an atrial fibrillation/flutter ablation 03/10/2017.  She has had much more in the way of atrial fibrillation and atrial flutter.  She has symptoms of weakness, fatigue, and shortness of breath.  Today, denies symptoms of palpitations, chest pain, shortness of breath, orthopnea, PND, lower extremity edema, claudication, dizziness, presyncope, syncope, bleeding, or neurologic sequela. The patient is tolerating medications without difficulties.  Overall she is doing well.  She has no chest pain or shortness of breath.  She has noted no further episodes of atrial fibrillation since her last ablation.  Tolerating medications without issue.   Past Medical History:  Diagnosis Date  . Anxiety 06/29/2012  . Aortic regurgitation 08/13/2015  . Arthritis    "hands, wrists, back, feet, toes" (06/24/2017)  . Atrial flutter ([place]) 09/18/2015  . Atypical chest pain 05/13/2016  . Chest pain    remote cath in 1996 with NORMAL coronaries noted  . Dyspnea 10/30/2010  . Essential hypertension 09/05/2013  .  Exogenous obesity    history of   . GERD (gastroesophageal reflux disease)   . Glaucoma, both eyes   . Headache, migraine    "stopped in my 50's" (09/18/2015)  . History of cardiovascular stress test 2007   showing no ischemia  . Hypertension   . Mitral stenosis and aortic insufficiency 06/23/2010  . Mitral stenosis with insufficiency   . Murmur    of mitral stenosis and moderate aortic insufficiency      . OSA on CPAP   . Palpitations    occasional  . Paroxysmal A-fib ([place]) 06/24/2017  . Pericardial effusion 09/18/2015  . Persistent atrial fibrillation 03/10/2017  . Personal history of rheumatic heart disease   . SBE (subacute bacterial endocarditis)    prophylaxis  . Sliding hiatal hernia    Past Surgical History:  Procedure Laterality Date  . ABDOMINAL HYSTERECTOMY  1975  . APPENDECTOMY  1977  . ATRIAL FIBRILLATION ABLATION N/A 03/10/2017   Procedure: ATRIAL FIBRILLATION ABLATION;  Surgeon: [name], MD;  Location: [place];  Service: Cardiovascular;  Laterality: N/A;  . ATRIAL FIBRILLATION ABLATION  06/24/2017  . ATRIAL FIBRILLATION ABLATION N/A 06/24/2017   Procedure: ATRIAL FIBRILLATION ABLATION;  Surgeon: [name], MD;  Location: [place] [facility];  Service: Cardiovascular;  Laterality: N/A;  . BUNIONECTOMY Right 2000s  . CARDIAC CATHETERIZATION  01/13/1995   normal coronary anatomy and mild mitral stenosis and mild pulmonary hypertension  . CARDIOVERSION N/A 09/22/2015   Procedure: CARDIOVERSION;  Surgeon: Jerline Pain, MD;  Location: [place];  Service: Cardiovascular;  Laterality: N/A;  . CARDIOVERSION N/A  10/25/2016   Procedure: CARDIOVERSION;  Surgeon: Fay Records, MD;  Location: [facility] ENDOSCOPY;  Service: Cardiovascular;  Laterality: N/A;  . CARDIOVERSION N/A 04/15/2017   Procedure: CARDIOVERSION;  Surgeon: Josue Hector, MD;  Location: [facility] ENDOSCOPY;  Service: Cardiovascular;  Laterality: N/A;  . CARDIOVERSION N/A 05/16/2017    Procedure: CARDIOVERSION;  Surgeon: Pixie Casino, MD;  Location: [facility] ENDOSCOPY;  Service: Cardiovascular;  Laterality: N/A;  . CATARACT EXTRACTION W/ INTRAOCULAR LENS  IMPLANT, BILATERAL Bilateral 2013  . LAPAROSCOPIC CHOLECYSTECTOMY  1997  . TEE WITHOUT CARDIOVERSION N/A 06/23/2017   Procedure: TRANSESOPHAGEAL ECHOCARDIOGRAM (TEE);  Surgeon: Fay Records, MD;  Location: [facility] ENDOSCOPY;  Service: Cardiovascular;  Laterality: N/A;  . TONSILLECTOMY AND ADENOIDECTOMY  1990s  . UVULOPALATOPHARYNGOPLASTY, TONSILLECTOMY AND SEPTOPLASTY  1990s     Current Outpatient Medications  Medication Sig Dispense Refill  . acetaminophen (TYLENOL) 500 MG tablet Take 500 mg by mouth every 6 (six) hours as needed for moderate pain or headache.    . ALPRAZolam (XANAX) 0.25 MG tablet Take 0.125 mg by mouth 2 (two) times daily as needed for anxiety.     . B Complex Vitamins (VITAMIN B COMPLEX PO) Take 1 capsule by mouth at bedtime.     . carvedilol (COREG) 12.5 MG tablet Take 1 tablet (12.5 mg total) by mouth 2 (two) times daily with a meal. 60 tablet 6  . Cholecalciferol (VITAMIN D) 2000 UNITS CAPS Take 2,000 Units by mouth daily.      Marland Kitchen conjugated estrogens (PREMARIN) vaginal cream Place 1 Applicatorful vaginally daily as needed (irritation).     Mariane Baumgarten Calcium (STOOL SOFTENER PO) Take 1 capsule by mouth 2 (two) times daily as needed (for constipation).     . dorzolamide-timolol (COSOPT) 22.3-6.8 MG/ML ophthalmic solution Place 1 drop into both eyes daily.    Marland Kitchen doxepin (SINEQUAN) 10 MG capsule Take 10-20 mg by mouth at bedtime as needed (sleep). Take 1 to 2 tablets at bedtime as needed    . ELIQUIS 5 MG TABS tablet TAKE 1 TABLET BY MOUTH TWO  TIMES DAILY 180 tablet 1  . flecainide (TAMBOCOR) 50 MG tablet Take 2 tablets (100 mg total) by mouth 2 (two) times daily. 120 tablet 3  . furosemide (LASIX) 20 MG tablet Take 1 tablet (20 mg total) by mouth daily as needed for fluid or edema (weight gain). 15 tablet 1    . gabapentin (NEURONTIN) 300 MG capsule Take 300 mg by mouth 2 (two) times daily.     . hydrochlorothiazide (MICROZIDE) 12.5 MG capsule TAKE 1 CAPSULE BY MOUTH  DAILY 90 capsule 0  . latanoprost (XALATAN) 0.005 % ophthalmic solution Place 1 drop into both eyes at bedtime.   6  . losartan (COZAAR) 100 MG tablet TAKE 1 TABLET BY MOUTH  DAILY 90 tablet 0  . Multiple Vitamin (MULTIVITAMIN WITH MINERALS) TABS tablet Take 1 tablet by mouth daily.    . NON FORMULARY Apply 1 application topically 2 (two) times daily as needed (for eczema). Traimcinolone/[employer] Moist Cream    . omeprazole (PRILOSEC) 40 MG capsule Take 40 mg by mouth daily.     . ondansetron (ZOFRAN) 4 MG tablet Take 4 mg by mouth every 8 (eight) hours as needed for nausea or vomiting.    Marland Kitchen oxybutynin (DITROPAN-XL) 10 MG 24 hr tablet Take 10 mg by mouth daily as needed (frequent urination).     Marland Kitchen PRESCRIPTION MEDICATION Inhale into the lungs at bedtime. CPAP    . promethazine (  PHENERGAN) 25 MG tablet Take 1 tablet (25 mg total) by mouth every 6 (six) hours as needed for nausea. 20 tablet 0   No current facility-administered medications for this visit.     Allergies:   Amoxicillin; Metoprolol; and Oxaprozin   Social History:  The patient  reports that she has never smoked. She has never used smokeless tobacco. She reports that she does not drink alcohol or use drugs.   Family History:  The patient's family history includes Diabetes in her brother; Heart attack in her mother; Heart failure in her maternal grandfather; Hypertension in her brother; Kidney disease in her brother.    ROS:  Please see the history of present illness.   Otherwise, review of systems is positive for chest twinges, shortness of breath.   All other systems are reviewed and negative.   PHYSICAL EXAM: VS:  BP 132/70   Pulse 73   Ht 5' (1.524 m)   Wt 164 lb 9.6 oz (74.7 kg)   SpO2 99%   BMI 32.15 kg/m  , BMI Body mass index is 32.15 kg/m. GEN: Well nourished,  well developed, in no acute distress  HEENT: normal  Neck: no JVD, carotid bruits, or masses Cardiac: RRR; no murmurs, rubs, or gallops,no edema  Respiratory:  clear to auscultation bilaterally, normal work of breathing GI: soft, nontender, nondistended, + BS MS: no deformity or atrophy  Skin: warm and dry Neuro:  Strength and sensation are intact Psych: euthymic mood, full affect  EKG:  EKG is ordered today. Personal review of the ekg ordered shows sinus rhythm, first-degree AV block, rate 73  Recent Labs: 03/21/2017: Magnesium 1.9 05/15/2017: B Natriuretic Peptide 342.8 06/14/2017: BUN 10; Creatinine, Ser 0.77; Hemoglobin 12.9; Platelets 209; Potassium 4.3; Sodium 140    Lipid Panel  No results found for: CHOL, TRIG, HDL, CHOLHDL, VLDL, LDLCALC, LDLDIRECT   Wt Readings from Last 3 Encounters:  12/27/17 164 lb 9.6 oz (74.7 kg)  09/21/17 161 lb 3.2 oz (73.1 kg)  07/25/17 157 lb (71.2 kg)      Other studies Reviewed: Additional studies/ records that were reviewed today include: TTE 05/28/16  Review of the above records today demonstrates:  - Left ventricle: The cavity size was normal. There was mild   concentric hypertrophy. Systolic function was normal. The   estimated ejection fraction was in the range of 60% to 65%. Wall   motion was normal; there were no regional wall motion   abnormalities. Features are consistent with a pseudonormal left   ventricular filling pattern, with concomitant abnormal relaxation   and increased filling pressure (grade 2 diastolic dysfunction).   Doppler parameters are consistent with elevated ventricular   end-diastolic filling pressure. - Aortic valve: Trileaflet; mildly thickened leaflets. There was   mild regurgitation. - Aortic root: The aortic root was normal in size. - Mitral valve: Calcified annulus. Moderately thickened, moderately   calcified leaflets . The findings are consistent with moderate   stenosis. There was mild  regurgitation. Mean gradient (D): 6 mm   Hg. Peak gradient (D): 12 mm Hg. - Left atrium: The atrium was severely dilated. - Right ventricle: Systolic function was normal. - Tricuspid valve: There was mild regurgitation. - Inferior vena cava: The vessel was normal in size. - Pericardium, extracardiac: Features were not consistent with   tamponade physiology.   ASSESSMENT AND PLAN:  1.  Persistent atrial fibrillation/flutter: Status post ablation 03/10/2017 with repeat ablation 06/24/2017.  On Eliquis, flecainide, and carvedilol.  No further  episodes of atrial fibrillation.    This patients CHA2DS2-VASc Score and unadjusted Ischemic Stroke Rate (% per year) is equal to 3.2 % stroke rate/year from a score of 3  Above score calculated as 1 point each if present [CHF, HTN, DM, Vascular=MI/PAD/Aortic Plaque, Age if 65-74, or Female] Above score calculated as 2 points each if present [Age > 75, or Stroke/TIA/TE]   2. Pericardial effusion: Table without tamponade physiology  3. Hypertension: Well-controlled.  No changes.    Current medicines are reviewed at length with the patient today.   The patient does not have concerns regarding her medicines.  The following changes were made today: None  Labs/ tests ordered today include:  Orders Placed This Encounter  Procedures  . EKG 12-Lead     Disposition:   FU with Pretty Weltman 6 months  Signed, Nikolis Berent Meredith Leeds, MD  12/27/2017 12:00 PM     [place] [address] [phone] (office) [phone] (fax)

## 2017-12-27 ENCOUNTER — Ambulatory Visit (INDEPENDENT_AMBULATORY_CARE_PROVIDER_SITE_OTHER): Payer: Medicare Other | Admitting: Cardiology

## 2017-12-27 ENCOUNTER — Encounter: Payer: Self-pay | Admitting: Cardiology

## 2017-12-27 VITALS — BP 132/70 | HR 73 | Ht 60.0 in | Wt 164.6 lb

## 2017-12-27 DIAGNOSIS — I313 Pericardial effusion (noninflammatory): Secondary | ICD-10-CM | POA: Diagnosis not present

## 2017-12-27 DIAGNOSIS — I10 Essential (primary) hypertension: Secondary | ICD-10-CM | POA: Diagnosis not present

## 2017-12-27 DIAGNOSIS — I4819 Other persistent atrial fibrillation: Secondary | ICD-10-CM

## 2017-12-27 DIAGNOSIS — I3139 Other pericardial effusion (noninflammatory): Secondary | ICD-10-CM

## 2017-12-27 NOTE — Patient Instructions (Signed)
Medication Instructions:  Your physician recommends that you continue on your current medications as directed. Please refer to the Current Medication list given to you today.  * If you need a refill on your cardiac medications before your next appointment, please call your pharmacy.   Labwork: None ordered *We will only notify you of abnormal results, otherwise continue current treatment plan.  Testing/Procedures: None ordered  Follow-Up: Your physician wants you to follow-up in: 6 months with Dr. Camnitz.  You will receive a reminder letter in the mail two months in advance. If you don't receive a letter, please call our office to schedule the follow-up appointment.  *Please note that any paperwork needing to be filled out by the provider will need to be addressed at the front desk prior to seeing the provider. Please note that any FMLA, disability or other documents regarding health condition is subject to a $25.00 charge that must be received prior to completion of paperwork in the form of a money order or check.  Thank you for choosing [facility]!!   Makayla Confer, RN [phone]        

## 2017-12-29 DIAGNOSIS — M79646 Pain in unspecified finger(s): Secondary | ICD-10-CM | POA: Insufficient documentation

## 2017-12-30 DIAGNOSIS — R2230 Localized swelling, mass and lump, unspecified upper limb: Secondary | ICD-10-CM | POA: Insufficient documentation

## 2018-01-04 ENCOUNTER — Encounter: Payer: Self-pay | Admitting: Cardiovascular Disease

## 2018-01-04 ENCOUNTER — Ambulatory Visit (INDEPENDENT_AMBULATORY_CARE_PROVIDER_SITE_OTHER): Payer: Medicare Other | Admitting: Cardiovascular Disease

## 2018-01-04 VITALS — BP 112/64 | HR 61 | Ht 60.0 in | Wt 166.4 lb

## 2018-01-04 DIAGNOSIS — I10 Essential (primary) hypertension: Secondary | ICD-10-CM | POA: Diagnosis not present

## 2018-01-04 DIAGNOSIS — I4819 Other persistent atrial fibrillation: Secondary | ICD-10-CM | POA: Diagnosis not present

## 2018-01-04 DIAGNOSIS — I080 Rheumatic disorders of both mitral and aortic valves: Secondary | ICD-10-CM

## 2018-01-04 DIAGNOSIS — I4892 Unspecified atrial flutter: Secondary | ICD-10-CM

## 2018-01-04 DIAGNOSIS — E7800 Pure hypercholesterolemia, unspecified: Secondary | ICD-10-CM

## 2018-01-04 NOTE — Progress Notes (Signed)
Cardiology Office Note  Date:  01/04/2018   ID:  Mary Farrell, DOB 1945-11-13, MRN [id number]  PCP:  Lawerance Cruel, MD  Cardiologist:   Skeet Latch, MD  Electrophysiologist: Dr. Curt Bears  No chief complaint on file.   History of Present Illness: Mary Farrell is a 72 y.o. female with moderate aortic regurgitation, mild-moderate mitral stenosis, chronic pericardial effusion, paroxysmal atrial flutter s/p ablation 05/2017, and hypertension who presents for follow up.  Mary Farrell was previously a patient of Dr. Mare Ferrari.  She followed up with Mary Farrell on 04/2015 due to shortness of breath that she felt was getting worse.  She was referred for an echo 04/2015 that revealed LVEF 55-60% with moderate AR and mild-moderate MS.  There was also a moderate pericardial effusion but no evidence of tamponade.  She had a heart cath 12/1994 that revealed normal coronaries with mild MS and mild pulmonary hypertension.  She also had a Cardiolite in 2007 that was negative for ischemia.  She was seen in clinic 07/2015 and reported persistent shortness of breath with minimal exertion and chest discomfort.  She was referred for exercise Myoview 08/26/15 that revealed LVEF 60% and no perfusion defects.    Mary Farrell was admitted 08/2015 with atrial flutter.  She had an echo that showed a persistent moderate-severe pericardial effusion but no tamponade.  She was started on amiodarone, which she did not tolerate due to nausea.  She underwent DCCV on 09/22/15 and then was started on flecainide.  She continued to report shortness of breath and was again referred for an echo that revealed LVEF 60-65% with grade 2 diastolic dysfunction, mild aortic regurgitation, and moderate mitral stenosis. She had a moderate pericardial effusion localized to the inferior and inferolateral walls. There was no evidence of tamponade.  Mary Farrell was seen in clinic 9/27 for an acute visit due to atrial fibrillation.  She had an  outpatient DCCV on 10/1 and converted with one shock at 200J.  She developed recurrent atrial fibrillation and was referred to EP.  She underwent afib/flutter ablation with Dr. Curt Bears on 06/24/17.  Since her last appointment she saw Roderic Palau, NP on 07/2017 and Dr. Curt Bears on 12/2017 and was doing well.  She continues to stay out of atrial fibrillation.  Overall she has been feeling well.  She notes some occasional episodes of substernal chest discomfort.  It lasts for approximately 1 minute.  It typically occurs at rest.  She does not get much exertion and is hoping to start exercising more soon.  She notes that she has some dizziness when she first stands but denies syncope or presyncope.  She has not experienced any lower extremity edema, orthopnea, or PND.  She continues to maintain sinus rhythm.  She is looking forward to her two granddaughters visiting for Christmas.   Past Medical History:  Diagnosis Date  . Anxiety 06/29/2012  . Aortic regurgitation 08/13/2015  . Arthritis    "hands, wrists, back, feet, toes" (06/24/2017)  . Atrial flutter ([place]) 09/18/2015  . Atypical chest pain 05/13/2016  . Chest pain    remote cath in 1996 with NORMAL coronaries noted  . Dyspnea 10/30/2010  . Essential hypertension 09/05/2013  . Exogenous obesity    history of   . GERD (gastroesophageal reflux disease)   . Glaucoma, both eyes   . Headache, migraine    "stopped in my 50's" (09/18/2015)  . History of cardiovascular stress test 2007   showing no ischemia  .  Hypertension   . Mitral stenosis and aortic insufficiency 06/23/2010  . Mitral stenosis with insufficiency   . Murmur    of mitral stenosis and moderate aortic insufficiency      . OSA on CPAP   . Palpitations    occasional  . Paroxysmal A-fib ([place]) 06/24/2017  . Pericardial effusion 09/18/2015  . Persistent atrial fibrillation 03/10/2017  . Personal history of rheumatic heart disease   . SBE (subacute bacterial endocarditis)    prophylaxis  .  Sliding hiatal hernia     Past Surgical History:  Procedure Laterality Date  . ABDOMINAL HYSTERECTOMY  1975  . APPENDECTOMY  1977  . ATRIAL FIBRILLATION ABLATION N/A 03/10/2017   Procedure: ATRIAL FIBRILLATION ABLATION;  Surgeon: [name], MD;  Location: [place] [facility];  Service: Cardiovascular;  Laterality: N/A;  . ATRIAL FIBRILLATION ABLATION  06/24/2017  . ATRIAL FIBRILLATION ABLATION N/A 06/24/2017   Procedure: ATRIAL FIBRILLATION ABLATION;  Surgeon: [name], MD;  Location: [place] [facility];  Service: Cardiovascular;  Laterality: N/A;  . BUNIONECTOMY Right 2000s  . CARDIAC CATHETERIZATION  01/13/1995   normal coronary anatomy and mild mitral stenosis and mild pulmonary hypertension  . CARDIOVERSION N/A 09/22/2015   Procedure: CARDIOVERSION;  Surgeon: Jerline Pain, MD;  Location: [place];  Service: Cardiovascular;  Laterality: N/A;  . CARDIOVERSION N/A 10/25/2016   Procedure: CARDIOVERSION;  Surgeon: Fay Records, MD;  Location: [facility] ENDOSCOPY;  Service: Cardiovascular;  Laterality: N/A;  . CARDIOVERSION N/A 04/15/2017   Procedure: CARDIOVERSION;  Surgeon: Josue Hector, MD;  Location: [facility] ENDOSCOPY;  Service: Cardiovascular;  Laterality: N/A;  . CARDIOVERSION N/A 05/16/2017   Procedure: CARDIOVERSION;  Surgeon: Pixie Casino, MD;  Location: [facility] ENDOSCOPY;  Service: Cardiovascular;  Laterality: N/A;  . CATARACT EXTRACTION W/ INTRAOCULAR LENS  IMPLANT, BILATERAL Bilateral 2013  . LAPAROSCOPIC CHOLECYSTECTOMY  1997  . TEE WITHOUT CARDIOVERSION N/A 06/23/2017   Procedure: TRANSESOPHAGEAL ECHOCARDIOGRAM (TEE);  Surgeon: Fay Records, MD;  Location: [facility] ENDOSCOPY;  Service: Cardiovascular;  Laterality: N/A;  . TONSILLECTOMY AND ADENOIDECTOMY  1990s  . UVULOPALATOPHARYNGOPLASTY, TONSILLECTOMY AND SEPTOPLASTY  1990s     Current Outpatient Medications  Medication Sig Dispense Refill  . acetaminophen (TYLENOL) 500 MG tablet Take 500 mg by mouth every 6  (six) hours as needed for moderate pain or headache.    . ALPRAZolam (XANAX) 0.25 MG tablet Take 0.125 mg by mouth 2 (two) times daily as needed for anxiety.     . B Complex Vitamins (VITAMIN B COMPLEX PO) Take 1 capsule by mouth at bedtime.     . carvedilol (COREG) 12.5 MG tablet Take 1 tablet (12.5 mg total) by mouth 2 (two) times daily with a meal. 60 tablet 6  . Cholecalciferol (VITAMIN D) 2000 UNITS CAPS Take 2,000 Units by mouth daily.      Marland Kitchen conjugated estrogens (PREMARIN) vaginal cream Place 1 Applicatorful vaginally daily as needed (irritation).     Mariane Baumgarten Calcium (STOOL SOFTENER PO) Take 1 capsule by mouth 2 (two) times daily as needed (for constipation).     . dorzolamide-timolol (COSOPT) 22.3-6.8 MG/ML ophthalmic solution Place 1 drop into both eyes daily.    Marland Kitchen doxepin (SINEQUAN) 10 MG capsule Take 10-20 mg by mouth at bedtime as needed (sleep). Take 1 to 2 tablets at bedtime as needed    . ELIQUIS 5 MG TABS tablet TAKE 1 TABLET BY MOUTH TWO  TIMES DAILY 180 tablet 1  . flecainide (TAMBOCOR) 50 MG  tablet Take 2 tablets (100 mg total) by mouth 2 (two) times daily. 120 tablet 3  . furosemide (LASIX) 20 MG tablet Take 1 tablet (20 mg total) by mouth daily as needed for fluid or edema (weight gain). 15 tablet 1  . gabapentin (NEURONTIN) 300 MG capsule Take 300 mg by mouth 4 (four) times daily.     . hydrochlorothiazide (MICROZIDE) 12.5 MG capsule TAKE 1 CAPSULE BY MOUTH  DAILY 90 capsule 0  . latanoprost (XALATAN) 0.005 % ophthalmic solution Place 1 drop into both eyes at bedtime.   6  . losartan (COZAAR) 100 MG tablet TAKE 1 TABLET BY MOUTH  DAILY 90 tablet 0  . Multiple Vitamin (MULTIVITAMIN WITH MINERALS) TABS tablet Take 1 tablet by mouth daily.    . NON FORMULARY Apply 1 application topically 2 (two) times daily as needed (for eczema). Traimcinolone/[employer] Moist Cream    . omeprazole (PRILOSEC) 40 MG capsule Take 40 mg by mouth daily.     . ondansetron (ZOFRAN) 4 MG tablet Take 4 mg  by mouth every 8 (eight) hours as needed for nausea or vomiting.    Marland Kitchen oxybutynin (DITROPAN-XL) 10 MG 24 hr tablet Take 10 mg by mouth daily.     Marland Kitchen PRESCRIPTION MEDICATION Inhale into the lungs at bedtime. CPAP    . promethazine (PHENERGAN) 25 MG tablet Take 1 tablet (25 mg total) by mouth every 6 (six) hours as needed for nausea. 20 tablet 0   No current facility-administered medications for this visit.     Allergies:   Amoxicillin; Metoprolol; and Oxaprozin    Social History:  The patient  reports that she has never smoked. She has never used smokeless tobacco. She reports that she does not drink alcohol or use drugs.   Family History:  The patient's  family history includes Diabetes in her brother; Heart attack in her mother; Heart failure in her maternal grandfather; Hypertension in her brother; Kidney disease in her brother.    ROS:  Please see the history of present illness.   Otherwise, review of systems are positive for none.   All other systems are reviewed and negative.    PHYSICAL EXAM: VS:  BP 112/64   Pulse 61   Ht 5' (1.524 m)   Wt 166 lb 6.4 oz (75.5 kg)   BMI 32.50 kg/m  , BMI Body mass index is 32.5 kg/m. GENERAL:  Well appearing HEENT: Pupils equal round and reactive, fundi not visualized, oral mucosa unremarkable NECK:  No jugular venous distention, waveform within normal limits, carotid upstroke brisk and symmetric, no bruits LUNGS:  Clear to auscultation bilaterally HEART:  RRR.  PMI not displaced or sustained,S1 and S2 within normal limits, + S3, no S4, no clicks, no rubs, III/VI diastolic murmur at the LUSB.  III/VI holosystolic murmur at the apex.   ABD:  Flat, positive bowel sounds normal in frequency in pitch, no bruits, no rebound, no guarding, no midline pulsatile mass, no hepatomegaly, no splenomegaly EXT:  2 plus pulses throughout, no edema, no cyanosis no clubbing SKIN:  No rashes no nodules NEURO:  Cranial nerves II through XII grossly intact, motor  grossly intact throughout PSYCH:  Cognitively intact, oriented to person place and time   EKG:  EKG is not ordered today. The ekg ordered 05/07/15 demonstraSinus bradycardia rate 57 bpm. 05/13/16: Sinus rhythm. Rate 66 bpm. 09/09/16: Sinus rhythm. Rate 64 bpm.   10/26/16: Sinus rhythm.  Rate 74 bpm.  Exercise Myoveiw 08/26/15:  The left  ventricular ejection fraction is normal (55-65%).  Nuclear stress EF: 60%.  Blood pressure demonstrated a hypertensive response to exercise.  Upsloping ST segment depression ST segment depression of 1 mm was noted during stress in the II, III, V6, V5 and aVF leads.  This is a low risk study.   No reversible ischemia. LVEF 60% with normal wall motion. Fair exercise tolerance. No chest pain. This is a low risk study.  Echo 05/28/16: Study Conclusions  - Left ventricle: The cavity size was normal. There was mild   concentric hypertrophy. Systolic function was normal. The   estimated ejection fraction was in the range of 60% to 65%. Wall   motion was normal; there were no regional wall motion   abnormalities. Features are consistent with a pseudonormal left   ventricular filling pattern, with concomitant abnormal relaxation   and increased filling pressure (grade 2 diastolic dysfunction).   Doppler parameters are consistent with elevated ventricular   end-diastolic filling pressure. - Aortic valve: Trileaflet; mildly thickened leaflets. There was   mild regurgitation. - Aortic root: The aortic root was normal in size. - Mitral valve: Calcified annulus. Moderately thickened, moderately   calcified leaflets . The findings are consistent with moderate   stenosis. There was mild regurgitation. Mean gradient (D): 6 mm   Hg. Peak gradient (D): 12 mm Hg. - Left atrium: The atrium was severely dilated. - Right ventricle: Systolic function was normal. - Tricuspid valve: There was mild regurgitation. - Inferior vena cava: The vessel was normal in size. -  Pericardium, extracardiac: Features were not consistent with   tamponade physiology.  Impressions:  - LVEF 60-65%.   There was moderate localized pericardial effusion around the   inferior and inferolateral walls unchanged from prior exam on   10/22/15. No evidence for tamponade.   Mitral valve is thickened and calcified, appears rheumatic with   anterior leaflet bowing. There is moderate mitral stenosis and   midl regurgitation.  Recent Labs: 03/21/2017: Magnesium 1.9 05/15/2017: B Natriuretic Peptide 342.8 06/14/2017: BUN 10; Creatinine, Ser 0.77; Hemoglobin 12.9; Platelets 209; Potassium 4.3; Sodium 140   07/05/16: Total cholesterol 167, triglycerides 159, HDL 47, LDL 88 TSH 2.2 Sodium 141, potassium 3.8, BUN 14, creatinine 0.77 AST 13, ALT 14  08/03/2017: Total cholesterol 215, triglycerides 121, HDL 67, LDL 124  Lipid Panel No results found for: CHOL, TRIG, HDL, CHOLHDL, VLDL, LDLCALC, LDLDIRECT    Wt Readings from Last 3 Encounters:  01/04/18 166 lb 6.4 oz (75.5 kg)  12/27/17 164 lb 9.6 oz (74.7 kg)  09/21/17 161 lb 3.2 oz (73.1 kg)     ASSESSMENT AND PLAN:  # Persitent atrial flutter/fibrillation: Doing well post-ablation.  She continues to maintain sinus rhythm.  Continue carvedilol, flecainide, and Eliquis.   # Atypical chest pain: # Shortness of breath: # Pericardial effusion:   Stable. No tamponade noted on echo 05/2016.  Negative stress in 2017 and symptoms are unchanged.  If she develops exertional symptoms we will get a stress test.  # Mild-moderate mitral stenosis: # Moderate AR: # Rheumatic heart disease: Mean gradient 6 mmHg 05/2016.  # Hypertension: BP well-controlled on carvedilol, HCTZ, and losartan.  # Hyperlipidemia: ASCVD 10 year risk 15%.  She wants to really focus on diet and exercise before starting a statin.  We will recheck in 4 months and reevaluate.     Current medicines are reviewed at length with the patient today.  The patient does  not have concerns regarding medicines.  The  following changes have been made:  None  Labs/ tests ordered today include:   No orders of the defined types were placed in this encounter.    Disposition:   FU with Rheana Casebolt C. Oval Linsey, MD, [facility] in 4 months    Signed, Tajuan Dufault C. Oval Linsey, MD, [facility]  01/04/2018 11:20 AM    [place]

## 2018-01-04 NOTE — Patient Instructions (Signed)
Medication Instructions:  Your physician recommends that you continue on your current medications as directed. Please refer to the Current Medication list given to you today.  If you need a refill on your cardiac medications before your next appointment, please call your pharmacy.   Lab work: NONE  Testing/Procedures: NONE  Follow-Up: At [facility], you and your health needs are our priority.  As part of our continuing mission to provide you with exceptional heart care, we have created designated Provider Care Teams.  These Care Teams include your primary Cardiologist (physician) and Advanced Practice Providers (APPs -  Physician Assistants and Nurse Practitioners) who all work together to provide you with the care you need, when you need it. You will need a follow up appointment in 4 months.  Please call our office 2 months in advance to schedule this appointment.  You may see DR [facility]  or one of the following Advanced Practice Providers on your designated Care Team:   Kerin Ransom, PA-C Roby Lofts, [place] . Sande Rives, PA-C  Any Other Special Instructions Will Be Listed Below (If Applicable). WORK HARDER ON DIET AND EXERCISE

## 2018-02-05 ENCOUNTER — Other Ambulatory Visit (HOSPITAL_COMMUNITY): Payer: Self-pay | Admitting: Physician Assistant

## 2018-02-21 ENCOUNTER — Other Ambulatory Visit (HOSPITAL_COMMUNITY): Payer: Self-pay | Admitting: Physician Assistant

## 2018-03-19 ENCOUNTER — Other Ambulatory Visit: Payer: Self-pay | Admitting: Cardiovascular Disease

## 2018-03-26 ENCOUNTER — Other Ambulatory Visit: Payer: Self-pay | Admitting: Cardiovascular Disease

## 2018-04-28 ENCOUNTER — Encounter: Payer: Self-pay | Admitting: *Deleted

## 2018-05-01 ENCOUNTER — Other Ambulatory Visit: Payer: Self-pay

## 2018-05-01 ENCOUNTER — Telehealth (INDEPENDENT_AMBULATORY_CARE_PROVIDER_SITE_OTHER): Payer: Medicare Other | Admitting: Cardiovascular Disease

## 2018-05-01 ENCOUNTER — Encounter: Payer: Self-pay | Admitting: Cardiovascular Disease

## 2018-05-01 DIAGNOSIS — R110 Nausea: Secondary | ICD-10-CM | POA: Diagnosis not present

## 2018-05-01 DIAGNOSIS — I483 Typical atrial flutter: Secondary | ICD-10-CM

## 2018-05-01 DIAGNOSIS — I080 Rheumatic disorders of both mitral and aortic valves: Secondary | ICD-10-CM

## 2018-05-01 DIAGNOSIS — R42 Dizziness and giddiness: Secondary | ICD-10-CM | POA: Insufficient documentation

## 2018-05-01 DIAGNOSIS — I10 Essential (primary) hypertension: Secondary | ICD-10-CM | POA: Diagnosis not present

## 2018-05-01 DIAGNOSIS — I313 Pericardial effusion (noninflammatory): Secondary | ICD-10-CM

## 2018-05-01 DIAGNOSIS — I351 Nonrheumatic aortic (valve) insufficiency: Secondary | ICD-10-CM | POA: Diagnosis not present

## 2018-05-01 DIAGNOSIS — I3139 Other pericardial effusion (noninflammatory): Secondary | ICD-10-CM

## 2018-05-01 DIAGNOSIS — I4819 Other persistent atrial fibrillation: Secondary | ICD-10-CM

## 2018-05-01 HISTORY — DX: Nausea: R11.0

## 2018-05-01 HISTORY — DX: Dizziness and giddiness: R42

## 2018-05-01 NOTE — Progress Notes (Signed)
Virtual Visit via Video Note   This visit type was conducted due to national recommendations for restrictions regarding the COVID-19 Pandemic (e.g. social distancing) in an effort to limit this patient's exposure and mitigate transmission in our community.  Due to her co-morbid illnesses, this patient is at least at moderate risk for complications without adequate follow up.  This format is felt to be most appropriate for this patient at this time.  All issues noted in this document were discussed and addressed.  A limited physical exam was performed with this format.  Please refer to the patient's chart for her consent to telehealth for [facility].   Evaluation Performed:  Follow-up visit  Date:  05/01/2018   ID:  Mary Farrell, DOB 22-Sep-1945, MRN [id number]  Patient Location: Home  Provider Location: Office  PCP:  Lawerance Cruel, MD  Cardiologist:  Skeet Latch, MD  Electrophysiologist:  Will Meredith Leeds, MD   Chief Complaint:  Follow up  History of Present Illness:    Mary Farrell is a 73 y.o. female who presents via audio/video conferencing for a telehealth visit today.    Mary Farrell is a 73 y.o. female with moderate aortic regurgitation, mild-moderate mitral stenosis, chronic pericardial effusion, paroxysmal atrial flutter s/p ablation 05/2017, and hypertension who presents for follow up.  Mary Farrell was previously a patient of Dr. Mare Ferrari.  She followed up with Truitt Merle on 04/2015 due to shortness of breath that she felt was getting worse.  She was referred for an echo 04/2015 that revealed LVEF 55-60% with moderate AR and mild-moderate MS.  There was also a moderate pericardial effusion but no evidence of tamponade.  She had a heart cath 12/1994 that revealed normal coronaries with mild MS and mild pulmonary hypertension.  She also had a Cardiolite in 2007 that was negative for ischemia.  She was seen in clinic 07/2015 and reported persistent shortness of breath  with minimal exertion and chest discomfort.  She was referred for exercise Myoview 08/26/15 that revealed LVEF 60% and no perfusion defects.    Mary Farrell was admitted 08/2015 with atrial flutter.  She had an echo that showed a persistent moderate-severe pericardial effusion but no tamponade.  She was started on amiodarone, which she did not tolerate due to nausea.  She underwent DCCV on 09/22/15 and then was started on flecainide.  She continued to report shortness of breath and was again referred for an echo that revealed LVEF 60-65% with grade 2 diastolic dysfunction, mild aortic regurgitation, and moderate mitral stenosis. She had a moderate pericardial effusion localized to the inferior and inferolateral walls. There was no evidence of tamponade.  Mary Farrell was seen in clinic 9/27 for an acute visit due to atrial fibrillation.  She had an outpatient DCCV on 10/1 and converted with one shock at 200J.  She developed recurrent atrial fibrillation and was referred to EP.  She underwent afib/flutter ablation with Dr. Curt Bears on 06/24/17.  Since her last appointment she saw Roderic Palau, NP on 07/2017 and Dr. Curt Bears on 12/2017 and was doing well. at her last appointment her lipids were elevated, but she wanted to work on diet and exercise.  She hasn't been exercising much.  She started participating in Weight Watchers and has lost 6lb since December.  Mary Farrell reports intermittent episodes of nausea over the last few days.  She is concerned because this was her primary symptom when in atrial fibrillation.  Her heart rate have remained  in the 60s, though it typically was very elevated when she was in atrial fibrillation.  She has not felt any palpitations, chest pain, or shortness of breath.  She did feel that her heart was beating hard in her ear.  When she checked her blood pressure was 156/56.  Later in the day it was 139/60.  She has not experienced any lower extremity edema, orthopnea, or PND.  Previously when  she was in atrial fibrillation she felt very anxious.  This has not been the case lately.  Overall she thinks the nausea is improving.  She did have a couple episodes of dizziness while sitting that occurred last week.  These episodes last for a few minutes.  She notes that it is worse when she changes her head position.  She denies orthostasis now but has experienced this in the past.  She has a history of vertigo that improved with going to physical therapy and doing Epley maneuvers.  She has not been getting much exercise lately.  The patient does not have symptoms concerning for COVID-19 infection (fever, chills, cough, or new shortness of breath).    Past Medical History:  Diagnosis Date   Anxiety 06/29/2012   Aortic regurgitation 08/13/2015   Arthritis    "hands, wrists, back, feet, toes" (06/24/2017)   Atrial flutter ([place]) 09/18/2015   Atypical chest pain 05/13/2016   Chest pain    remote cath in 1996 with NORMAL coronaries noted   Dyspnea 10/30/2010   Essential hypertension 09/05/2013   Exogenous obesity    history of    GERD (gastroesophageal reflux disease)    Glaucoma, both eyes    Headache, migraine    "stopped in my 81's" (09/18/2015)   History of cardiovascular stress test 2007   showing no ischemia   Hypertension    Mitral stenosis and aortic insufficiency 06/23/2010   Mitral stenosis with insufficiency    Murmur    of mitral stenosis and moderate aortic insufficiency       OSA on CPAP    Palpitations    occasional   Paroxysmal A-fib ([place]) 06/24/2017   Pericardial effusion 09/18/2015   Persistent atrial fibrillation 03/10/2017   Personal history of rheumatic heart disease    SBE (subacute bacterial endocarditis)    prophylaxis   Sliding hiatal hernia    Past Surgical History:  Procedure Laterality Date   [place] N/A 03/10/2017   Procedure: ATRIAL FIBRILLATION ABLATION;   Surgeon: [name], MD;  Location: [place] [facility];  Service: Cardiovascular;  Laterality: N/A;   ATRIAL FIBRILLATION ABLATION  06/24/2017   ATRIAL FIBRILLATION ABLATION N/A 06/24/2017   Procedure: ATRIAL FIBRILLATION ABLATION;  Surgeon: [name], MD;  Location: [place] [facility];  Service: Cardiovascular;  Laterality: N/A;   BUNIONECTOMY Right 2000s   CARDIAC CATHETERIZATION  01/13/1995   normal coronary anatomy and mild mitral stenosis and mild pulmonary hypertension   CARDIOVERSION N/A 09/22/2015   Procedure: CARDIOVERSION;  Surgeon: Jerline Pain, MD;  Location: [facility] ENDOSCOPY;  Service: Cardiovascular;  Laterality: N/A;   CARDIOVERSION N/A 10/25/2016   Procedure: CARDIOVERSION;  Surgeon: Fay Records, MD;  Location: [place];  Service: Cardiovascular;  Laterality: N/A;   CARDIOVERSION N/A 04/15/2017   Procedure: CARDIOVERSION;  Surgeon: Josue Hector, MD;  Location: [facility] ENDOSCOPY;  Service: Cardiovascular;  Laterality: N/A;   CARDIOVERSION N/A 05/16/2017   Procedure: CARDIOVERSION;  Surgeon: Lyman Bishop  C, MD;  Location: [place];  Service: Cardiovascular;  Laterality: N/A;   CATARACT EXTRACTION W/ INTRAOCULAR LENS  IMPLANT, BILATERAL Bilateral 2013   LAPAROSCOPIC CHOLECYSTECTOMY  1997   TEE WITHOUT CARDIOVERSION N/A 06/23/2017   Procedure: TRANSESOPHAGEAL ECHOCARDIOGRAM (TEE);  Surgeon: Fay Records, MD;  Location: [facility] ENDOSCOPY;  Service: Cardiovascular;  Laterality: N/A;   TONSILLECTOMY AND ADENOIDECTOMY  1990s   UVULOPALATOPHARYNGOPLASTY, TONSILLECTOMY AND SEPTOPLASTY  1990s     Current Meds  Medication Sig   acetaminophen (TYLENOL) 500 MG tablet Take 500 mg by mouth every 6 (six) hours as needed for moderate pain or headache.   ALPRAZolam (XANAX) 0.25 MG tablet Take 0.125 mg by mouth 2 (two) times daily as needed for anxiety.    B Complex Vitamins (VITAMIN B COMPLEX PO) Take 1 capsule by mouth at bedtime.    carvedilol (COREG)  12.5 MG tablet TAKE 1 TABLET (12.5 MG TOTAL) BY MOUTH 2 (TWO) TIMES DAILY WITH A MEAL.   cetirizine (ZYRTEC) 10 MG tablet Take 10 mg by mouth daily.   Cholecalciferol (VITAMIN D) 2000 UNITS CAPS Take 2,000 Units by mouth daily.     conjugated estrogens (PREMARIN) vaginal cream Place 1 Applicatorful vaginally daily as needed (irritation).    Docusate Calcium (STOOL SOFTENER PO) Take 1 capsule by mouth 2 (two) times daily as needed (for constipation).    dorzolamide-timolol (COSOPT) 22.3-6.8 MG/ML ophthalmic solution Place 1 drop into both eyes daily.   doxepin (SINEQUAN) 10 MG capsule Take 10-20 mg by mouth at bedtime as needed (sleep). Take 1 to 2 tablets at bedtime as needed   ELIQUIS 5 MG TABS tablet TAKE 1 TABLET BY MOUTH TWO  TIMES DAILY   flecainide (TAMBOCOR) 50 MG tablet TAKE 2 TABLETS BY MOUTH TWO TIMES DAILY   furosemide (LASIX) 20 MG tablet Take 1 tablet (20 mg total) by mouth daily as needed for fluid or edema (weight gain).   gabapentin (NEURONTIN) 300 MG capsule Take 300 mg by mouth 3 (three) times daily.    hydrochlorothiazide (MICROZIDE) 12.5 MG capsule TAKE 1 CAPSULE BY MOUTH  DAILY   latanoprost (XALATAN) 0.005 % ophthalmic solution Place 1 drop into both eyes at bedtime.    losartan (COZAAR) 100 MG tablet TAKE 1 TABLET BY MOUTH  DAILY   Multiple Vitamin (MULTIVITAMIN WITH MINERALS) TABS tablet Take 1 tablet by mouth daily.   NON FORMULARY Apply 1 application topically 2 (two) times daily as needed (for eczema). Traimcinolone/[employer] Moist Cream   omeprazole (PRILOSEC) 40 MG capsule Take 40 mg by mouth daily.    ondansetron (ZOFRAN) 4 MG tablet Take 4 mg by mouth every 8 (eight) hours as needed for nausea or vomiting.   oxybutynin (DITROPAN-XL) 10 MG 24 hr tablet Take 10 mg by mouth daily.    PRESCRIPTION MEDICATION Inhale into the lungs at bedtime. CPAP   promethazine (PHENERGAN) 25 MG tablet Take 1 tablet (25 mg total) by mouth every 6 (six) hours as needed for  nausea.     Allergies:   Amoxicillin; Metoprolol; and Oxaprozin   Social History   Tobacco Use   Smoking status: Never Smoker   Smokeless tobacco: Never Used  Substance Use Topics   Alcohol use: No   Drug use: No     Family Hx: The patient's family history includes Diabetes in her brother; Heart attack in her mother; Heart failure in her maternal grandfather; Hypertension in her brother; Kidney disease in her brother.  ROS:   Please see the history  of present illness.     All other systems reviewed and are negative.   Prior CV studies:   The following studies were reviewed today:  Exercise Myoveiw 08/26/15:  The left ventricular ejection fraction is normal (55-65%).  Nuclear stress EF: 60%.  Blood pressure demonstrated a hypertensive response to exercise.  Upsloping ST segment depression ST segment depression of 1 mm was noted during stress in the II, III, V6, V5 and aVF leads.  This is a low risk study.  No reversible ischemia. LVEF 60% with normal wall motion. Fair exercise tolerance. No chest pain. This is a low risk study.  Echo 05/28/16: Study Conclusions  - Left ventricle: The cavity size was normal. There was mild concentric hypertrophy. Systolic function was normal. The estimated ejection fraction was in the range of 60% to 65%. Wall motion was normal; there were no regional wall motion abnormalities. Features are consistent with a pseudonormal left ventricular filling pattern, with concomitant abnormal relaxation and increased filling pressure (grade 2 diastolic dysfunction). Doppler parameters are consistent with elevated ventricular end-diastolic filling pressure. - Aortic valve: Trileaflet; mildly thickened leaflets. There was mild regurgitation. - Aortic root: The aortic root was normal in size. - Mitral valve: Calcified annulus. Moderately thickened, moderately calcified leaflets . The findings are consistent with  moderate stenosis. There was mild regurgitation. Mean gradient (D): 6 mm Hg. Peak gradient (D): 12 mm Hg. - Left atrium: The atrium was severely dilated. - Right ventricle: Systolic function was normal. - Tricuspid valve: There was mild regurgitation. - Inferior vena cava: The vessel was normal in size. - Pericardium, extracardiac: Features were not consistent with tamponade physiology.  Impressions:  - LVEF 60-65%. There was moderate localized pericardial effusion around the inferior and inferolateral walls unchanged from prior exam on 10/22/15. No evidence for tamponade. Mitral valve is thickened and calcified, appears rheumatic with anterior leaflet bowing. There is moderate mitral stenosis and midl regurgitation.   Labs/Other Tests and Data Reviewed:    EKG:  No ECG reviewed.  Recent Labs: 05/15/2017: B Natriuretic Peptide 342.8 06/14/2017: BUN 10; Creatinine, Ser 0.77; Hemoglobin 12.9; Platelets 209; Potassium 4.3; Sodium 140   Recent Lipid Panel No results found for: CHOL, TRIG, HDL, CHOLHDL, LDLCALC, LDLDIRECT  Wt Readings from Last 3 Encounters:  05/01/18 161 lb 6.4 oz (73.2 kg)  01/04/18 166 lb 6.4 oz (75.5 kg)  12/27/17 164 lb 9.6 oz (74.7 kg)     Objective:    BP 139/60    Pulse 60    Ht 5' (1.524 m)    Wt 161 lb 6.4 oz (73.2 kg)    BMI 31.52 kg/m  GENERAL: Well-appearing.  No acute distress. HEENT: Pupils equal round.  Oral mucosa unremarkable NECK:  No jugular venous distention, no visible thyromegaly EXT:  No edema, no cyanosis no clubbing SKIN:  No rashes no nodules NEURO:  Speech fluent.  Cranial nerves grossly intact.  Moves all 4 extremities freely PSYCH:  Cognitively intact, oriented to person place and time   ASSESSMENT & PLAN:    # Persitent atrial flutter/fibrillation: Doing well post-ablation.    She has experienced some nausea which was a symptom of her A. fib in the past.  The episodes seem to be transient.  She will get  the AliveCor app so that she can monitor her rhythm at home. Continue carvedilol flecainide, and Eliquis.  # Dizziness: No positional changes and it seems to be consistent with vertigo.  We discussed using meclizine but she thinks  that she will be okay for now.  # Hypertension: Blood pressure has been running as high as the 150s and as low as the 90s.  I have asked her to track her blood pressure twice daily and bring for follow-up.  Continue current regimen for now.  # Atypical chest pain: # Shortness of breath: # Pericardial effusion:   Resolved.  # Mild-moderate mitral stenosis: # Moderate AR: # Rheumatic heart disease: Mean gradient 6 mmHg 05/2016.  # Hypertension: BP well-controlled on carvedilol, HCTZ, and losartan.  # Hyperlipidemia: ASCVD 10 year risk 15%.  She has not been exercising but is losing some weight through weight watchers.  She is due to have lipids rechecked.  We will do this at follow-up.     COVID-19 Education: The signs and symptoms of COVID-19 were discussed with the patient and how to seek care for testing (follow up with PCP or arrange E-visit).  The importance of social distancing was discussed today.  Time:   Today, I have spent 24 minutes with the patient with telehealth technology discussing the above problems.     Medication Adjustments/Labs and Tests Ordered: Current medicines are reviewed at length with the patient today.  Concerns regarding medicines are outlined above.  Tests Ordered: No orders of the defined types were placed in this encounter.  Medication Changes: No orders of the defined types were placed in this encounter.   Disposition:  Follow up in 2 month(s)  Signed, Skeet Latch, MD  05/01/2018 2:05 PM    [place]

## 2018-05-01 NOTE — Patient Instructions (Addendum)
Consider getting the AliveCor personal EKG monitor.  If you are wondering if you are in atrial fibrillation this can take an EKG for you.  Medication Instructions:  Your physician recommends that you continue on your current medications as directed. Please refer to the Current Medication list given to you today.  If you need a refill on your cardiac medications before your next appointment, please call your pharmacy.   Lab work: NONE.  Testing/Procedures: NONE  Follow-Up: Your physician recommends that you schedule a follow-up appointment in: 2 MONTHS THE OFFICE WILL CALL YOU TO ARRANGE APPOINTMENT   Any Other Special Instructions Will Be Listed Below (If Applicable). MONITOR AND LOG YOUR BLOOD PRESSURE TWICE A DAY, LET us KNOW HOW IT IS DOING

## 2018-05-04 ENCOUNTER — Telehealth: Payer: Self-pay | Admitting: Cardiovascular Disease

## 2018-05-04 NOTE — Telephone Encounter (Signed)
New Message    Pt c/o of Chest Pain: STAT if CP now or developed within 24 hours  1. Are you having CP right now? Under left breast, in a specific place and it only last a few seconds   2. Are you experiencing any other symptoms (ex. SOB, nausea, vomiting, sweating)? No, when it happens, she gets a little SOB  3. How long have you been experiencing CP? This morning   4. Is your CP continuous or coming and going? Come and goes   5. Have you taken Nitroglycerin? No  ?

## 2018-05-04 NOTE — Telephone Encounter (Signed)
Pt report that she has been having intermittent chest pain under left breast since about 10 am, that occur about 3-4 times an hour. She report symptoms usually last about 6-7 sec and she has some SOB. Pt state BP today was 114/54 HR 61.   While on the phone, pt experienced two episodes. Pt advised to report to ED for further evaluations. Pt declined and voiced she didn't want  to risk being exposed to the [place] virus. Pt state she would prefer to wait for Dr. Blenda Mounts recommendation. Will route to MD.

## 2018-05-05 NOTE — Telephone Encounter (Signed)
SHe has no CAD.  This is not an acute coronary symptom, especially given that it is very atypical.  With those vital signs it is unlikely to be a significant pericardial effusion again either.  It would be reasonable to get an echo.

## 2018-05-05 NOTE — Telephone Encounter (Signed)
Spoke with patient and she feels fine today and would like to wait on Echo. Advised to call back if anything changes, verbalized understanding.

## 2018-05-10 ENCOUNTER — Ambulatory Visit: Payer: Medicare Other | Admitting: Cardiovascular Disease

## 2018-06-05 ENCOUNTER — Encounter: Payer: Self-pay | Admitting: *Deleted

## 2018-06-19 ENCOUNTER — Other Ambulatory Visit: Payer: Self-pay | Admitting: Cardiovascular Disease

## 2018-06-19 ENCOUNTER — Other Ambulatory Visit: Payer: Self-pay | Admitting: Cardiology

## 2018-06-20 NOTE — Telephone Encounter (Signed)
Rx request sent to pharmacy.  

## 2018-06-22 IMAGING — CT CT HEART MORPH/PULM VEIN W/ CM & W/O CA SCORE
2 of 7 series · 11 of 20 positions shown, 13 images · non-contrast
Comparison: 09/18/2015

CLINICAL DATA: Atrial fibrillation, pre-ablation

EXAM:
Cardiac CTA
MEDICATIONS:
Sub lingual nitro. 4mg x 2 and diltiazem 10 mg IV
TECHNIQUE: The patient was scanned on a Siemens [REDACTED]ice scanner. Gantry
rotation speed was 250 msecs. Collimation was 0.6 mm. A 100 kV
prospective scan was triggered in the ascending thoracic aorta at
35-75% of the R-R interval. Average HR during the scan was 60 bpm.
The 3D data set was interpreted on a dedicated work station using
MPR, MIP and VRT modes. A total of 80cc of contrast was used.

[Series 21: multi 0-90% · axial · 0.42mm/px · z∈[+1032,+1129]mm · 5 of 2430 slices shown]
[im 405/2430  vessel]
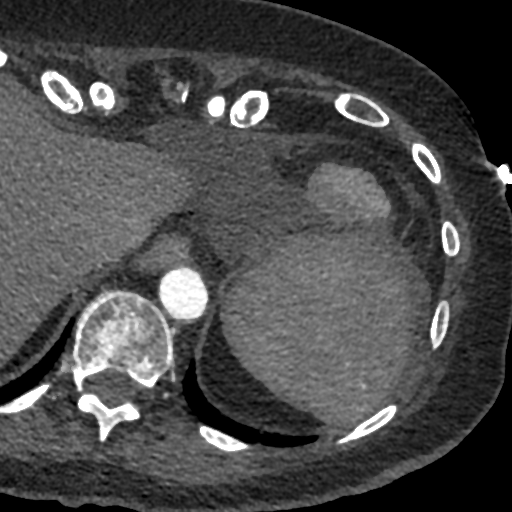
[im 810/2430  vessel]
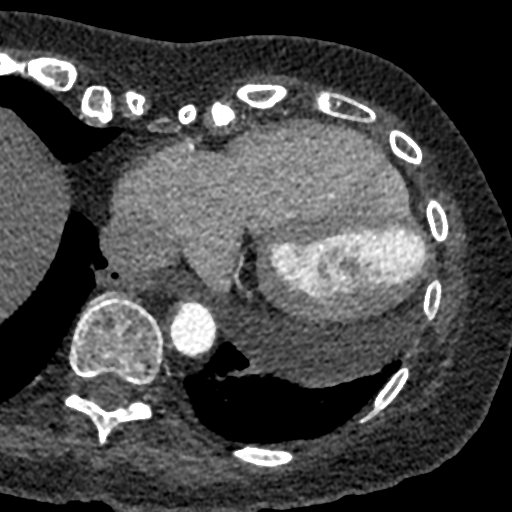
[im 1215/2430  vessel]
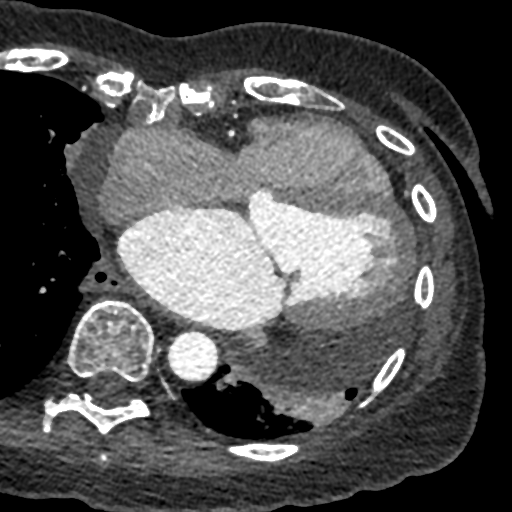
[im 1620/2430  vessel]
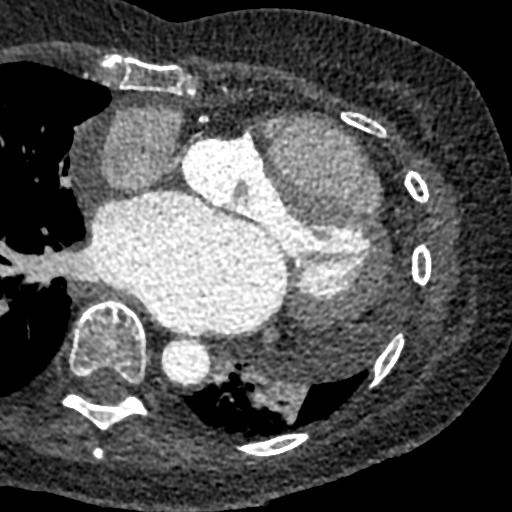
[im 2025/2430  vessel]
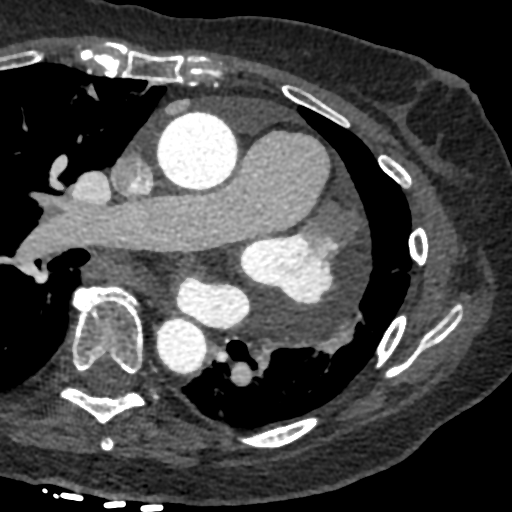

[Series 23: multi 5-95% · axial · 0.42mm/px · z∈[+1028,+1132]mm · 6 of 2430 slices shown, 8 images]
[im 348/2430  vessel]
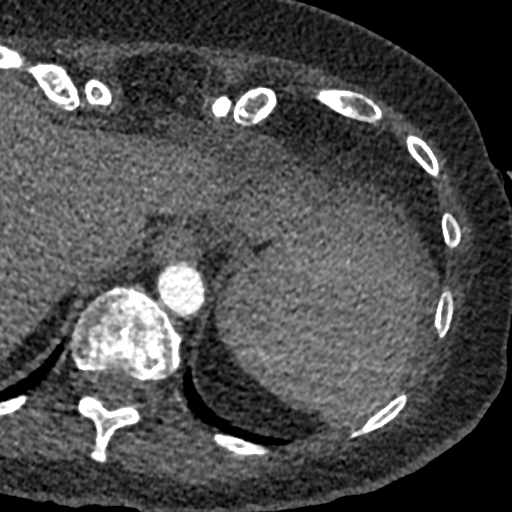
[im 348/2430  lung]
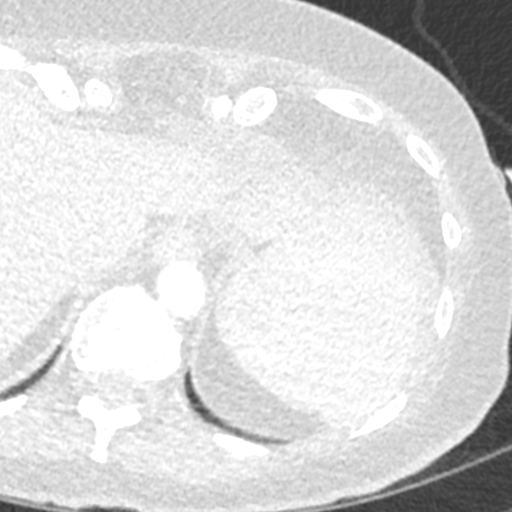
[im 695/2430  vessel]
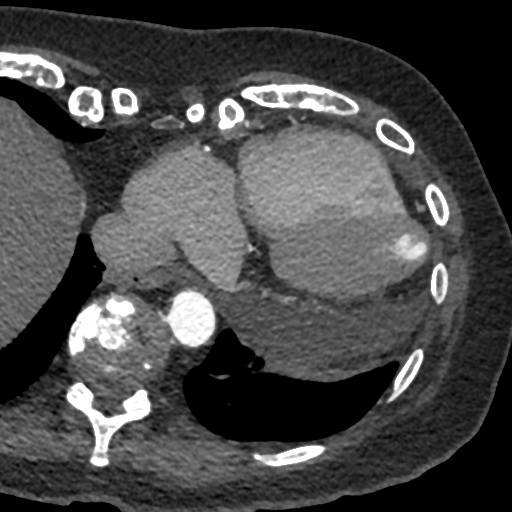
[im 1042/2430  vessel]
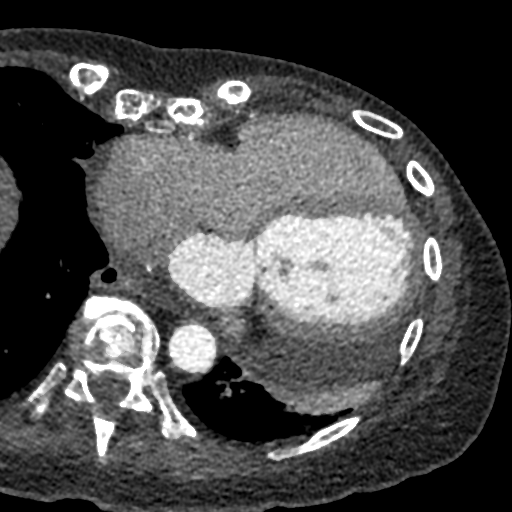
[im 1389/2430  vessel]
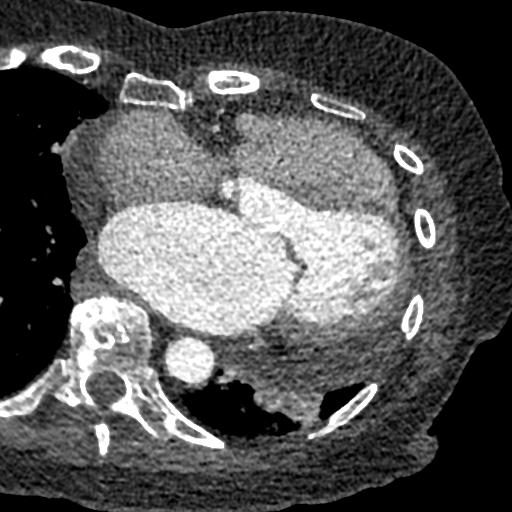
[im 1736/2430  vessel]
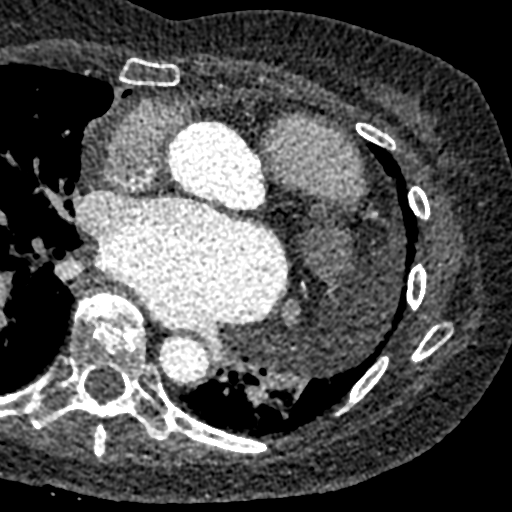
[im 1736/2430  lung]
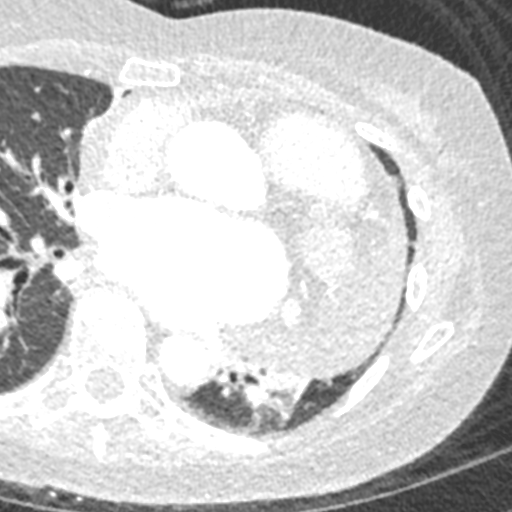
[im 2083/2430  vessel]
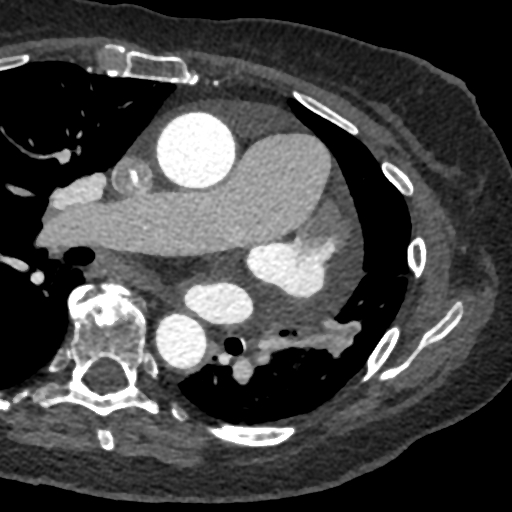

[11 of 20 positions shown; findings below may reference images not displayed]

FINDINGS: Non-cardiac: See separate report from [REDACTED].

Moderate pericardial effusion noted more prominently laterally.

There is a filling defect at the tip of the LA appendage. I cannot
rule out a thrombus though may just be poor filling of the
appendage.

Calcium Score: Registers at 0 Agatston units.

Coronary Arteries: Right dominant with no anomalies

LM: No plaque or stenosis.

LAD system: Mixed plaque without significant stenosis mid LAD.

Circumflex system: No plaque or stenosis.

RCA system: No plaque or stenosis.

Pulmonary veins: All drain to left atrium.

RUPV 16 mm x 23 mm

RLPV 17 mm x 21 mm

LUPV 16 x 22 mm

LLPV 5 x 11 mm. Small vessel but does not appear stenosed. Close
proximity to descending thoracic aorta.
IMPRESSION: 1.  Pericardial effusion, moderate and predominantly lateral.

2. Filling defect LA appendage. Could be poor filling of the
appendage but cannot rule out thrombus. Cannot clear appendage on
CT, will need JEDEDIAH.

3. Coronary artery calcium score 0 Agatston units, low risk for
future cardiac events.

4.  No significant coronary disease noted.

5. Pulmonary veins as noted above. Left lower pulmonary vein is very
small but does not appear stenosed.

Christene Bettis

EXAM:
OVER-READ INTERPRETATION  CT CHEST

The following report is an over-read performed by radiologist Dr.
Quirijn Amazigh [REDACTED] on 06/21/2017. This over-read
does not include interpretation of cardiac or coronary anatomy or
pathology. The coronary CTA interpretation by the cardiologist is
attached.
FINDINGS: Vascular: Cardiomegaly. Moderate pericardial effusion. Visualized
aorta is normal caliber.

Mediastinum/Nodes: No adenopathy in the lower mediastinum or hila.

Lungs/Pleura: Compressive atelectasis in the left lower lobe
adjacent to the left heart border is. No effusions.

Upper Abdomen: Imaging into the upper abdomen shows no acute
findings.

Musculoskeletal: Chest wall soft tissues are unremarkable. No acute
bony abnormality.
IMPRESSION: Cardiomegaly. Moderate size pericardial effusion. Adjacent
compressive atelectasis in the left lower lobe.

## 2018-06-27 ENCOUNTER — Telehealth: Payer: Self-pay | Admitting: Adult Health

## 2018-06-27 NOTE — Telephone Encounter (Signed)
Video visit/no my chart/pre reg complete/consent obtained -- ttf

## 2018-06-27 NOTE — Telephone Encounter (Signed)
Pt does have my chart -- ttf

## 2018-07-03 ENCOUNTER — Encounter: Payer: Self-pay | Admitting: Medical

## 2018-07-03 ENCOUNTER — Telehealth (INDEPENDENT_AMBULATORY_CARE_PROVIDER_SITE_OTHER): Payer: Medicare Other | Admitting: Medical

## 2018-07-03 VITALS — BP 112/54 | HR 58 | Ht 60.0 in | Wt 157.0 lb

## 2018-07-03 DIAGNOSIS — R42 Dizziness and giddiness: Secondary | ICD-10-CM

## 2018-07-03 DIAGNOSIS — E7800 Pure hypercholesterolemia, unspecified: Secondary | ICD-10-CM

## 2018-07-03 DIAGNOSIS — Z79899 Other long term (current) drug therapy: Secondary | ICD-10-CM

## 2018-07-03 DIAGNOSIS — I4892 Unspecified atrial flutter: Secondary | ICD-10-CM

## 2018-07-03 DIAGNOSIS — I080 Rheumatic disorders of both mitral and aortic valves: Secondary | ICD-10-CM

## 2018-07-03 DIAGNOSIS — I10 Essential (primary) hypertension: Secondary | ICD-10-CM

## 2018-07-03 MED ORDER — LOSARTAN POTASSIUM 100 MG PO TABS: 100.0000 mg | ORAL_TABLET | Freq: Every day | ORAL | 1 refills | Status: DC

## 2018-07-03 NOTE — Patient Instructions (Signed)
Medication Instructions:  NO CHANGES- Your physician recommends that you continue on your current medications as directed. Please refer to the Current Medication list given to you today. If you need a refill on your cardiac medications before your next appointment, please call your pharmacy.  Labwork: FASTING LIPID PANEL AND LFT HERE IN OUR OFFICE AT [facility]   You will need to fast. DO NOT EAT OR DRINK PAST MIDNIGHT.      Take the provided lab slips with you to the lab for your blood draw.   When you have your labs (blood work) drawn today and your tests are completely normal, you will receive your results only by MyChart Message (if you have MyChart) -OR-  A paper copy in the mail.  If you have any lab test that is abnormal or we need to change your treatment, we will call you to review these results.  Special Instructions: MAKE SURE TO KEEP YOUR APPOINTMENT WITH DR [place] PCP TO DISCUSS DIZZINESS W/FLUSHING  PLEASE TRY TO DO YOUR CARTIA APP WHEN HAVING THESE EPISODES AND EMAIL THEN TO [place]  Follow-Up: You will need a follow up appointment in 6 months.  Please call our office 2 months in advance to schedule this appointment.  You may see Skeet Latch, MD or one of the following Advanced Practice Providers on your designated Care Team:  Kerin Ransom, [place]  Roby Lofts, PA-C  Sande Rives, [place]      At [facility], you and your health needs are our priority.  As part of our continuing mission to provide you with exceptional heart care, we have created designated Provider Care Teams.  These Care Teams include your primary Cardiologist (physician) and Advanced Practice Providers (APPs -  Physician Assistants and Nurse Practitioners) who all work together to provide you with the care you need, when you need it.  Thank you for choosing [facility] at [facility]!!

## 2018-07-03 NOTE — Progress Notes (Signed)
Virtual Visit via Video Note   This visit type was conducted due to national recommendations for restrictions regarding the COVID-19 Pandemic (e.g. social distancing) in an effort to limit this patient's exposure and mitigate transmission in our community.  Due to her co-morbid illnesses, this patient is at least at moderate risk for complications without adequate follow up.  This format is felt to be most appropriate for this patient at this time.  All issues noted in this document were discussed and addressed.  A limited physical exam was performed with this format.  Please refer to the patient's chart for her consent to telehealth for [facility].   Date:  07/03/2018   ID:  Mary Farrell, DOB 31-Dec-1945, MRN [id number]  Patient Location: Home Provider Location: Office  PCP:  Mary Cruel, MD  Cardiologist:  Mary Latch, MD  Electrophysiologist:  Mary Haw, MD   Evaluation Performed:  Follow-Up Visit  Chief Complaint:  2 month follow-up  History of Present Illness:    Mary Farrell is a 73 y.o. female with a PMH of paroxysmal atrial fibrillation/flutter s/p ablation 05/2017, HTN, moderate AI, mild-moderate MS, chronic pericardial effusion, who is being seen today for 2 month follow-up visit.   She was last evaluated by cardiology at a telemedicine visit with Dr. Oval Linsey 04/2018, at which time the patient had concerns for recurrent atrial fibrillation given intermittent nausea which was her primary symptom with Afib. She was recommended to purchase the [employer] app to monitor for recurrent atrial fibrillation, otherwise was continued on her current medication without adjustments. She was requested to monitor her BP closely and keep a log to discuss at her follow-up visit.  She presents via video visit today and states she's been doing okay from a cardiac standpoint. She reports that she is still experiencing episodes of dizziness with an associated flushing sensation.  These episodes occur ~20x per week and last for ~1 minute before resolving spontaneously. They primarily occur at rest. She does not experience any chest pain, SOB, diaphoresis, lightheadedness, N/V, palpitations, racing heart beat, or syncope with these episodes. She reports taking her BP/HR shortly after and they unchanged from her baseline of SBP in the 110s and HR in the 70s. She reported obtaining the Kardia mobile EKG monitor but has not obtained an EKG during one of these episodes. Her other home EKGs have revealed sinus rhythm and indeterminate rhythm. She has not sent these to our office for review. No complaints of bleeding with eliquis.   The patient does not have symptoms concerning for COVID-19 infection (fever, chills, cough, or new shortness of breath).    Past Medical History:  Diagnosis Date  . Anxiety 06/29/2012  . Aortic regurgitation 08/13/2015  . Arthritis    "hands, wrists, back, feet, toes" (06/24/2017)  . Atrial flutter ([place]) 09/18/2015  . Atypical chest pain 05/13/2016  . Chest pain    remote cath in 1996 with NORMAL coronaries noted  . Dyspnea 10/30/2010  . Essential hypertension 09/05/2013  . Exogenous obesity    history of   . GERD (gastroesophageal reflux disease)   . Glaucoma, both eyes   . Headache, migraine    "stopped in my 50's" (09/18/2015)  . History of cardiovascular stress test 2007   showing no ischemia  . Hypertension   . Mitral stenosis and aortic insufficiency 06/23/2010  . Mitral stenosis with insufficiency   . Murmur    of mitral stenosis and moderate aortic insufficiency      .  Nausea 05/01/2018  . OSA on CPAP   . Palpitations    occasional  . Paroxysmal A-fib ([place]) 06/24/2017  . Pericardial effusion 09/18/2015  . Persistent atrial fibrillation 03/10/2017  . Personal history of rheumatic heart disease   . SBE (subacute bacterial endocarditis)    prophylaxis  . Sliding hiatal hernia   . Vertigo 05/01/2018   Past Surgical History:  Procedure  Laterality Date  . ABDOMINAL HYSTERECTOMY  1975  . APPENDECTOMY  1977  . ATRIAL FIBRILLATION ABLATION N/A 03/10/2017   Procedure: ATRIAL FIBRILLATION ABLATION;  Surgeon: Mary Haw, MD;  Location: [place] [facility];  Service: Cardiovascular;  Laterality: N/A;  . ATRIAL FIBRILLATION ABLATION  06/24/2017  . ATRIAL FIBRILLATION ABLATION N/A 06/24/2017   Procedure: ATRIAL FIBRILLATION ABLATION;  Surgeon: Mary Haw, MD;  Location: [place] [facility];  Service: Cardiovascular;  Laterality: N/A;  . BUNIONECTOMY Right 2000s  . CARDIAC CATHETERIZATION  01/13/1995   normal coronary anatomy and mild mitral stenosis and mild pulmonary hypertension  . CARDIOVERSION N/A 09/22/2015   Procedure: CARDIOVERSION;  Surgeon: Jerline Pain, MD;  Location: [place];  Service: Cardiovascular;  Laterality: N/A;  . CARDIOVERSION N/A 10/25/2016   Procedure: CARDIOVERSION;  Surgeon: Fay Records, MD;  Location: [facility] ENDOSCOPY;  Service: Cardiovascular;  Laterality: N/A;  . CARDIOVERSION N/A 04/15/2017   Procedure: CARDIOVERSION;  Surgeon: Josue Hector, MD;  Location: [facility] ENDOSCOPY;  Service: Cardiovascular;  Laterality: N/A;  . CARDIOVERSION N/A 05/16/2017   Procedure: CARDIOVERSION;  Surgeon: Pixie Casino, MD;  Location: [facility] ENDOSCOPY;  Service: Cardiovascular;  Laterality: N/A;  . CATARACT EXTRACTION W/ INTRAOCULAR LENS  IMPLANT, BILATERAL Bilateral 2013  . LAPAROSCOPIC CHOLECYSTECTOMY  1997  . TEE WITHOUT CARDIOVERSION N/A 06/23/2017   Procedure: TRANSESOPHAGEAL ECHOCARDIOGRAM (TEE);  Surgeon: Fay Records, MD;  Location: [facility] ENDOSCOPY;  Service: Cardiovascular;  Laterality: N/A;  . TONSILLECTOMY AND ADENOIDECTOMY  1990s  . UVULOPALATOPHARYNGOPLASTY, TONSILLECTOMY AND SEPTOPLASTY  1990s     Current Meds  Medication Sig  . acetaminophen (TYLENOL) 500 MG tablet Take 500 mg by mouth every 6 (six) hours as needed for moderate pain or headache.  . ALPRAZolam (XANAX) 0.25 MG tablet Take 0.125  mg by mouth 2 (two) times daily as needed for anxiety.   . B Complex Vitamins (VITAMIN B COMPLEX PO) Take 1 capsule by mouth at bedtime.   . carvedilol (COREG) 12.5 MG tablet TAKE 1 TABLET (12.5 MG TOTAL) BY MOUTH 2 (TWO) TIMES DAILY WITH A MEAL.  . cetirizine (ZYRTEC) 10 MG tablet Take 10 mg by mouth daily.  . Cholecalciferol (VITAMIN D) 2000 UNITS CAPS Take 2,000 Units by mouth daily.    Marland Kitchen conjugated estrogens (PREMARIN) vaginal cream Place 1 Applicatorful vaginally daily as needed (irritation).   Mariane Baumgarten Calcium (STOOL SOFTENER PO) Take 1 capsule by mouth 2 (two) times daily as needed (for constipation).   . dorzolamide-timolol (COSOPT) 22.3-6.8 MG/ML ophthalmic solution Place 1 drop into both eyes daily.  Marland Kitchen doxepin (SINEQUAN) 10 MG capsule Take 10-20 mg by mouth at bedtime as needed (sleep). Take 1 to 2 tablets at bedtime as needed  . ELIQUIS 5 MG TABS tablet TAKE 1 TABLET BY MOUTH TWO  TIMES DAILY  . flecainide (TAMBOCOR) 50 MG tablet TAKE 2 TABLETS BY MOUTH TWO TIMES DAILY  . gabapentin (NEURONTIN) 300 MG capsule Take 300 mg by mouth 3 (three) times daily.   . hydrochlorothiazide (MICROZIDE) 12.5 MG capsule TAKE 1 CAPSULE BY MOUTH  DAILY  .  lactobacillus acidophilus (BACID) TABS tablet Take 2 tablets by mouth 3 (three) times daily.  Marland Kitchen latanoprost (XALATAN) 0.005 % ophthalmic solution Place 1 drop into both eyes at bedtime.   Marland Kitchen losartan (COZAAR) 100 MG tablet Take 1 tablet (100 mg total) by mouth daily.  . Multiple Vitamin (MULTIVITAMIN WITH MINERALS) TABS tablet Take 1 tablet by mouth daily.  . NON FORMULARY Apply 1 application topically 2 (two) times daily as needed (for eczema). Traimcinolone/[employer] Moist Cream  . omeprazole (PRILOSEC) 40 MG capsule Take 40 mg by mouth daily.   Marland Kitchen oxybutynin (DITROPAN-XL) 10 MG 24 hr tablet Take 10 mg by mouth daily.   Marland Kitchen PRESCRIPTION MEDICATION Inhale into the lungs at bedtime. CPAP  . [DISCONTINUED] losartan (COZAAR) 100 MG tablet TAKE 1 TABLET BY MOUTH   DAILY     Allergies:   Amoxicillin; Metoprolol; and Oxaprozin   Social History   Tobacco Use  . Smoking status: Never Smoker  . Smokeless tobacco: Never Used  Substance Use Topics  . Alcohol use: No  . Drug use: No     Family Hx: The patient's family history includes Diabetes in her brother; Heart attack in her mother; Heart failure in her maternal grandfather; Hypertension in her brother; Kidney disease in her brother.  ROS:   Please see the history of present illness.     All other systems reviewed and are negative.   Prior CV studies:   The following studies were reviewed today:  TEE 05/2017: Left ventricle:  LV size is normal LV systolic function appears at least mildly depressed.  Aortic valve:  AV is mildly thickened. Mild eccentric AI.  Mitral valve:  Mild MR. MV is thickened with restricted motion MVA by P T 1/2 is 1.6 cm2.  Doppler:     Valve area by pressure half-time: 1.63 cm^2. Indexed valve area by pressure half-time: 0.92 cm^2/m^2.  Left atrium:  LA, LAA without masses. LA is enlarged Spontaneous contrast is seen swirling in LA appendage that clears.   Atrial septum:  There is a small ASD in the mid atrial septum with continuous L to R flow. May represent an iatrogenic sequelae from prior ablation.  Pulmonic valve:   PV is normal  Tricuspid valve:  TV is normal Mild TR.  NST 2017:  The left ventricular ejection fraction is normal (55-65%).  Nuclear stress EF: 60%.  Blood pressure demonstrated a hypertensive response to exercise.  Upsloping ST segment depression ST segment depression of 1 mm was noted during stress in the II, III, V6, V5 and aVF leads.  This is a low risk study.   No reversible ischemia. LVEF 60% with normal wall motion. Fair exercise tolerance. No chest pain. This is a low risk study.  Labs/Other Tests and Data Reviewed:    EKG:  An ECG dated 12/27/2017 was personally reviewed today and demonstrated:  sinus rhythm  with 1st degree AV block, no STE/D, no TWI  Recent Labs: No results found for requested labs within last 8760 hours.   Recent Lipid Panel No results found for: CHOL, TRIG, HDL, CHOLHDL, LDLCALC, LDLDIRECT  Wt Readings from Last 3 Encounters:  07/03/18 157 lb (71.2 kg)  05/01/18 161 lb 6.4 oz (73.2 kg)  01/04/18 166 lb 6.4 oz (75.5 kg)     Objective:    Vital Signs:  BP (!) 112/54   Pulse (!) 58   Ht 5' (1.524 m)   Wt 157 lb (71.2 kg)   BMI 30.66 kg/m  VITAL SIGNS:  reviewed GEN:  no acute distress EYES:  sclerae anicteric, EOMI - Extraocular Movements Intact RESPIRATORY:  normal respiratory effort, symmetric expansion NEURO:  alert and oriented x 3, no obvious focal deficit PSYCH:  normal affect  ASSESSMENT & PLAN:     1. Paroxysmal atrial fibrillation/flutter s/p ablation 05/2017: possible these episodes of dizziness/flushing could be recurrent Afib/flutter. She obtained the [facility] app but has not checked an EKG during one of these spells.  - Asked her to use the [employer]ia app to evaluate whether she's having recurrent arrhythmias.  - Continue carvedilol for rate control - Continue flecainide for rhythm control - Continue eliquis for stroke ppx  2. Dizziness: she reports intermittent dizziness with associated flushing lasting ~1 minute before resolving spontaneously. She reports ~20 episodes per week. Possible this is recurrent vertigo. Asked her to obtain a Kardia EKG with her episodes to evaluate for possible recurrent PAF.  - Recommended PCP follow-up for possible non-cardiac etiology  3. HTN: She reports SBP typically in the 110s with occasional 130s. Overall sounds stable - Continue losartan, carvedilol, and HCTZ  4. HLD: due for lipids check - Will check a FLP  5. Valvulopathy: noted to have mild AI and mild MS on TEE 05/2017 - Can continue routine monitoring.    COVID-19 Education: The signs and symptoms of COVID-19 were discussed with the patient and how to  seek care for testing (follow up with PCP or arrange E-visit).  The importance of social distancing was discussed today.  Time:   Today, I have spent 17 minutes with the patient with telehealth technology discussing the above problems.     Medication Adjustments/Labs and Tests Ordered: Current medicines are reviewed at length with the patient today.  Concerns regarding medicines are outlined above.   Tests Ordered: Orders Placed This Encounter  Procedures  . Lipid panel  . Hepatic function panel    Medication Changes: Meds ordered this encounter  Medications  . losartan (COZAAR) 100 MG tablet    Sig: Take 1 tablet (100 mg total) by mouth daily.    Dispense:  90 tablet    Refill:  1    Disposition:  Follow up with Dr. Oval Linsey in 6 months Signed, Abigail Butts, PA-C  07/03/2018 5:01 PM    [place]

## 2018-07-05 LAB — HEPATIC FUNCTION PANEL
ALT: 19 IU/L (ref 0–32)
AST: 20 IU/L (ref 0–40)
Albumin: 4.5 g/dL (ref 3.7–4.7)
Alkaline Phosphatase: 75 IU/L (ref 39–117)
Bilirubin Total: 0.5 mg/dL (ref 0.0–1.2)
Bilirubin, Direct: 0.14 mg/dL (ref 0.00–0.40)
Total Protein: 6.5 g/dL (ref 6.0–8.5)

## 2018-07-12 ENCOUNTER — Other Ambulatory Visit: Payer: Self-pay | Admitting: *Deleted

## 2018-07-12 DIAGNOSIS — Z79899 Other long term (current) drug therapy: Secondary | ICD-10-CM

## 2018-07-14 ENCOUNTER — Telehealth: Payer: Self-pay | Admitting: *Deleted

## 2018-07-14 NOTE — Telephone Encounter (Signed)
   Primary Cardiologist: Skeet Latch, MD   Pt contacted.  History and symptoms reviewed.  Pt will f[facility]u with [facility] provider as scheduled.  Pt. advised that we are restricting visitors at this time and request that only patients present for check-in prior to their appointment.  All other visitors should remain in their car.  If necessary, only one visitor may come with the patient, into the building. For everyone's safety, all patients and visitor entering our practice area should expect to be screened again prior to entering our waiting area.  Mary Farrell  07/14/2018 5:06 PM     COVID-19 Pre-Screening Questions:  . In the past 7 to 10 days have you had a cough,  shortness of breath, headache, congestion, fever (100 or greater) body aches, chills, sore throat, or sudden loss of taste or sense of smell?  no . Have you been around anyone with known Covid 19?  no . Have you been around anyone who is awaiting Covid 19 test results in the past 7 to 10 days?  no . Have you been around anyone who has been exposed to Covid 19, or has mentioned symptoms of Covid 19 within the past 7 to 10 days?  No   If you have any concerns/questions about symptoms patients report during screening (either on the phone or at threshold). Contact the provider seeing the patient or DOD for further guidance.  If neither are available contact a member of the leadership team.

## 2018-07-19 ENCOUNTER — Telehealth: Payer: Self-pay | Admitting: Cardiology

## 2018-07-19 NOTE — Telephone Encounter (Signed)
Follow up     [place]  1. Do you currently have a fever?  a. NO  2. Have you recently traveled on a cruise, internationally, or to [place], [place], [place], [place], [place], or [place], [place] [employer])?  A. NO  3. Have you been in contact with someone that is currently pending confirmation of Covid19 testing or has been confirmed to have the [place] virus?  a. NO  4. Are you currently experiencing fatigue or cough?  a. NO

## 2018-07-19 NOTE — Telephone Encounter (Signed)
New Message     Called and left voicemail for pt to call back to confirm appt. Also needs to answer COVID Screening Questions

## 2018-07-20 ENCOUNTER — Other Ambulatory Visit: Payer: Self-pay

## 2018-07-20 ENCOUNTER — Encounter: Payer: Self-pay | Admitting: Cardiology

## 2018-07-20 ENCOUNTER — Ambulatory Visit (INDEPENDENT_AMBULATORY_CARE_PROVIDER_SITE_OTHER): Payer: Medicare Other | Admitting: Cardiology

## 2018-07-20 VITALS — BP 122/62 | HR 67 | Ht 60.0 in | Wt 157.0 lb

## 2018-07-20 DIAGNOSIS — I4819 Other persistent atrial fibrillation: Secondary | ICD-10-CM

## 2018-07-20 NOTE — Progress Notes (Signed)
Electrophysiology Office Note   Date:  07/20/2018   ID:  Devlin, Mcveigh 11/17/1945, MRN [id number]  PCP:  Lawerance Cruel, MD  Cardiologist:  Oval Linsey Primary Electrophysiologist:  Marylon Verno Meredith Leeds, MD    No chief complaint on file.    History of Present Illness: Mary Farrell is a 73 y.o. female who is being seen today for the evaluation of atrial fibrillation at the request of Lawerance Cruel, MD. Presenting today for electrophysiology evaluation. She has a history of moderate aortic regurgitation, mild to moderate mitral stenosis, chronic pericardial effusion, paroxysmal atrial flutter, and hypertension. She had a heart catheterization in 1996 revealed normal coronaries with mild MS and mild pulmonary hypertension.  She was admitted 09/14/2015 with atrial flutter. She was started on amiodarone but did not tolerate it due to nausea.  He has had multiple cardioversions in the past.  She had an atrial fibrillation/flutter ablation 03/10/2017 with repeat ablation 06/13/2017.    Today, denies symptoms of palpitations, chest pain, shortness of breath, orthopnea, PND, lower extremity edema, claudication, dizziness, presyncope, syncope, bleeding, or neurologic sequela. The patient is tolerating medications without difficulties.  Overall she is doing well.  She has no chest pain or shortness of breath.  She is able to do all of her daily activities.  She did have one episode of atrial fibrillation on Monday that lasted for 18 or so hours.  She popped back into normal rhythm on her own.   Past Medical History:  Diagnosis Date   Anxiety 06/29/2012   Aortic regurgitation 08/13/2015   Arthritis    "hands, wrists, back, feet, toes" (06/24/2017)   Atrial flutter ([place]) 09/18/2015   Atypical chest pain 05/13/2016   Chest pain    remote cath in 1996 with NORMAL coronaries noted   Dyspnea 10/30/2010   Essential hypertension 09/05/2013   Exogenous obesity    history of    GERD  (gastroesophageal reflux disease)    Glaucoma, both eyes    Headache, migraine    "stopped in my 58's" (09/18/2015)   History of cardiovascular stress test 2007   showing no ischemia   Hypertension    Mitral stenosis and aortic insufficiency 06/23/2010   Mitral stenosis with insufficiency    Murmur    of mitral stenosis and moderate aortic insufficiency       Nausea 05/01/2018   OSA on CPAP    Palpitations    occasional   Paroxysmal A-fib ([place]) 06/24/2017   Pericardial effusion 09/18/2015   Persistent atrial fibrillation 03/10/2017   Personal history of rheumatic heart disease    SBE (subacute bacterial endocarditis)    prophylaxis   Sliding hiatal hernia    Vertigo 05/01/2018   Past Surgical History:  Procedure Laterality Date   [place] N/A 03/10/2017   Procedure: ATRIAL FIBRILLATION ABLATION;  Surgeon: [name], MD;  Location: [facility];  Service: Cardiovascular;  Laterality: N/A;   ATRIAL FIBRILLATION ABLATION  06/24/2017   ATRIAL FIBRILLATION ABLATION N/A 06/24/2017   Procedure: ATRIAL FIBRILLATION ABLATION;  Surgeon: [name], MD;  Location: [facility];  Service: Cardiovascular;  Laterality: N/A;   BUNIONECTOMY Right 2000s   CARDIAC CATHETERIZATION  01/13/1995   normal coronary anatomy and mild mitral stenosis and mild pulmonary hypertension   CARDIOVERSION N/A 09/22/2015   Procedure: CARDIOVERSION;  Surgeon: Jerline Pain, MD;  Location: [facility];  Service:  Cardiovascular;  Laterality: N/A;   CARDIOVERSION N/A 10/25/2016   Procedure: CARDIOVERSION;  Surgeon: Fay Records, MD;  Location: [facility];  Service: Cardiovascular;  Laterality: N/A;   CARDIOVERSION N/A 04/15/2017   Procedure: CARDIOVERSION;  Surgeon: Josue Hector, MD;  Location: [facility] END[facility];  Service: Cardiovascular;  Laterality: N/A;   CARDIOVERSION N/A 05/16/2017    Procedure: CARDIOVERSION;  Surgeon: Pixie Casino, MD;  Location: [facility] ENDOSCOPY;  Service: Cardiovascular;  Laterality: N/A;   CATARACT EXTRACTION W/ INTRAOCULAR LENS  IMPLANT, BILATERAL Bilateral 2013   LAPAROSCOPIC CHOLECYSTECTOMY  1997   TEE WITHOUT CARDIOVERSION N/A 06/23/2017   Procedure: TRANSESOPHAGEAL ECHOCARDIOGRAM (TEE);  Surgeon: Fay Records, MD;  Location: [facility] ENDOSCOPY;  Service: Cardiovascular;  Laterality: N/A;   TONSILLECTOMY AND ADENOIDECTOMY  1990s   UVULOPALATOPHARYNGOPLASTY, TONSILLECTOMY AND SEPTOPLASTY  1990s     Current Outpatient Medications  Medication Sig Dispense Refill   acetaminophen (TYLENOL) 500 MG tablet Take 500 mg by mouth every 6 (six) hours as needed for moderate pain or headache.     ALPRAZolam (XANAX) 0.25 MG tablet Take 0.125 mg by mouth 2 (two) times daily as needed for anxiety.      B Complex Vitamins (VITAMIN B COMPLEX PO) Take 1 capsule by mouth at bedtime.      carvedilol (COREG) 12.5 MG tablet TAKE 1 TABLET (12.5 MG TOTAL) BY MOUTH 2 (TWO) TIMES DAILY WITH A MEAL. 60 tablet 6   cetirizine (ZYRTEC) 10 MG tablet Take 10 mg by mouth daily.     Cholecalciferol (VITAMIN D) 2000 UNITS CAPS Take 2,000 Units by mouth daily.       conjugated estrogens (PREMARIN) vaginal cream Place 1 Applicatorful vaginally daily as needed (irritation).      Docusate Calcium (STOOL SOFTENER PO) Take 1 capsule by mouth 2 (two) times daily as needed (for constipation).      dorzolamide-timolol (COSOPT) 22.3-6.8 MG/ML ophthalmic solution Place 1 drop into both eyes daily.     doxepin (SINEQUAN) 10 MG capsule Take 10-20 mg by mouth at bedtime as needed (sleep). Take 1 to 2 tablets at bedtime as needed     ELIQUIS 5 MG TABS tablet TAKE 1 TABLET BY MOUTH TWO  TIMES DAILY 180 tablet 1   flecainide (TAMBOCOR) 50 MG tablet TAKE 2 TABLETS BY MOUTH TWO TIMES DAILY 360 tablet 3   furosemide (LASIX) 20 MG tablet Take 1 tablet (20 mg total) by mouth daily as needed  for fluid or edema (weight gain). 15 tablet 1   gabapentin (NEURONTIN) 300 MG capsule Take 300 mg by mouth 3 (three) times daily.      hydrochlorothiazide (MICROZIDE) 12.5 MG capsule TAKE 1 CAPSULE BY MOUTH  DAILY 90 capsule 3   lactobacillus acidophilus (BACID) TABS tablet Take 2 tablets by mouth 3 (three) times daily.     latanoprost (XALATAN) 0.005 % ophthalmic solution Place 1 drop into both eyes at bedtime.   6   losartan (COZAAR) 100 MG tablet Take 1 tablet (100 mg total) by mouth daily. (Patient taking differently: Take 50 mg by mouth daily. ) 90 tablet 1   Multiple Vitamin (MULTIVITAMIN WITH MINERALS) TABS tablet Take 1 tablet by mouth daily.     NON FORMULARY Apply 1 application topically 2 (two) times daily as needed (for eczema). Traimcinolone/[employer] Moist Cream     omeprazole (PRILOSEC) 40 MG capsule Take 40 mg by mouth daily.      ondansetron (ZOFRAN) 4 MG tablet Take 4 mg by mouth every 8 (  eight) hours as needed for nausea or vomiting.     oxybutynin (DITROPAN-XL) 10 MG 24 hr tablet Take 10 mg by mouth daily.      PRESCRIPTION MEDICATION Inhale into the lungs at bedtime. CPAP     promethazine (PHENERGAN) 25 MG tablet Take 1 tablet (25 mg total) by mouth every 6 (six) hours as needed for nausea. 20 tablet 0   No current facility-administered medications for this visit.     Allergies:   Amoxicillin, Metoprolol, Metoprolol tartrate, and Oxaprozin   Social History:  The patient  reports that she has never smoked. She has never used smokeless tobacco. She reports that she does not drink alcohol or use drugs.   Family History:  The patient's family history includes Diabetes in her brother; Heart attack in her mother; Heart failure in her maternal grandfather; Hypertension in her brother; Kidney disease in her brother.    ROS:  Please see the history of present illness.   Otherwise, review of systems is positive for none.   All other systems are reviewed and negative.    PHYSICAL EXAM: VS:  BP 122/62    Pulse 67    Ht 5' (1.524 m)    Wt 157 lb (71.2 kg)    BMI 30.66 kg/m  , BMI Body mass index is 30.66 kg/m. GEN: Well nourished, well developed, in no acute distress  HEENT: normal  Neck: no JVD, carotid bruits, or masses Cardiac: RRR; no murmurs, rubs, or gallops,no edema  Respiratory:  clear to auscultation bilaterally, normal work of breathing GI: soft, nontender, nondistended, + BS MS: no deformity or atrophy  Skin: warm and dry Neuro:  Strength and sensation are intact Psych: euthymic mood, full affect  EKG:  EKG is ordered today. Personal review of the ekg ordered shows sinus rhythm, first-degree AV block, rate 67  Recent Labs: 07/05/2018: ALT 19    Lipid Panel  No results found for: CHOL, TRIG, HDL, CHOLHDL, VLDL, LDLCALC, LDLDIRECT   Wt Readings from Last 3 Encounters:  07/20/18 157 lb (71.2 kg)  07/03/18 157 lb (71.2 kg)  05/01/18 161 lb 6.4 oz (73.2 kg)      Other studies Reviewed: Additional studies/ records that were reviewed today include: TTE 05/28/16  Review of the above records today demonstrates:  - Left ventricle: The cavity size was normal. There was mild   concentric hypertrophy. Systolic function was normal. The   estimated ejection fraction was in the range of 60% to 65%. Wall   motion was normal; there were no regional wall motion   abnormalities. Features are consistent with a pseudonormal left   ventricular filling pattern, with concomitant abnormal relaxation   and increased filling pressure (grade 2 diastolic dysfunction).   Doppler parameters are consistent with elevated ventricular   end-diastolic filling pressure. - Aortic valve: Trileaflet; mildly thickened leaflets. There was   mild regurgitation. - Aortic root: The aortic root was normal in size. - Mitral valve: Calcified annulus. Moderately thickened, moderately   calcified leaflets . The findings are consistent with moderate   stenosis. There was  mild regurgitation. Mean gradient (D): 6 mm   Hg. Peak gradient (D): 12 mm Hg. - Left atrium: The atrium was severely dilated. - Right ventricle: Systolic function was normal. - Tricuspid valve: There was mild regurgitation. - Inferior vena cava: The vessel was normal in size. - Pericardium, extracardiac: Features were not consistent with   tamponade physiology.   ASSESSMENT AND PLAN:  1.  Persistent  atrial fibrillation/flutter: Status post ablation with repeat ablation 03/10/2017 and 04/24/2017.  Currently on Eliquis, flecainide, carvedilol.  She had one episode of atrial fibrillation on Monday but otherwise has done well.  She popped back into sinus rhythm on her own.  If she has further episodes, we Eligha Kmetz plan to increase flecainide.  This patients CHA2DS2-VASc Score and unadjusted Ischemic Stroke Rate (% per year) is equal to 3.2 % stroke rate/year from a score of 3  Above score calculated as 1 point each if present [CHF, HTN, DM, Vascular=MI/PAD/Aortic Plaque, Age if 65-74, or Female] Above score calculated as 2 points each if present [Age > 75, or Stroke/TIA/TE]    2. Pericardial effusion: Stable without tamponade physiology  3. Hypertension: Currently well controlled.  Treylon Henard decrease losartan to 50 mg due to her episodes.    Current medicines are reviewed at length with the patient today.   The patient does not have concerns regarding her medicines.  The following changes were made today: Decrease losartan  Labs/ tests ordered today include:  No orders of the defined types were placed in this encounter.    Disposition:   FU with Liz Pinho 6 months  Signed, Tamber Burtch Meredith Leeds, MD  07/20/2018 11:55 AM     [facility] [address] [place] [address] [phone] (office) [phone] (fax)

## 2018-07-20 NOTE — Addendum Note (Signed)
Addended by: Marlis Edelson C on: 07/20/2018 12:04 PM   Modules accepted: Orders

## 2018-07-20 NOTE — Patient Instructions (Signed)
Medication Instructions:  Decrease losartan to 50mg   Labwork: NONE  Testing/Procedures: NONE  Follow-Up:  Return in about 6 months (around 01/19/2019).   We will or send you a reminder letter in about 3 months to make your follow up appointment in December with Dr. Curt Bears.   If you need a refill on your cardiac medications before your next appointment, please call your pharmacy.

## 2018-08-01 ENCOUNTER — Other Ambulatory Visit: Payer: Self-pay | Admitting: Cardiology

## 2018-08-01 MED ORDER — CARVEDILOL 12.5 MG PO TABS: 12.5000 mg | ORAL_TABLET | Freq: Two times a day (BID) | ORAL | 3 refills | Status: DC

## 2018-10-14 ENCOUNTER — Telehealth: Payer: Self-pay | Admitting: Student

## 2018-10-14 NOTE — Telephone Encounter (Signed)
   Patient called the after-hours line reporting feeling jittery and anxious throughout a majority of the day which resembles her prior atrial fibrillation. Denies any associated chest pain, palpitations, or dyspnea. She checked her vitals while on the phone and BP was 138/85 with a pulse of 105. She does have a Kardia monitor and was told her her rhythm was undetermined. Recommended she take an extra Coreg at this time and recheck her vitals before taking her PM medications.  Will forward today's message to the [facility] so they can follow-up with the patient on Monday for if still having symptoms she will require an office visit.  Signed, Erma Heritage, PA-C 10/14/2018, 5:11 PM Pager: [phone]

## 2018-10-15 ENCOUNTER — Inpatient Hospital Stay (HOSPITAL_COMMUNITY)
Admission: EM | Admit: 2018-10-15 | Discharge: 2018-10-20 | DRG: 287 | Disposition: A | Discharge status: Home / Self Care | Payer: Medicare Other | Attending: Internal Medicine | Admitting: Internal Medicine

## 2018-10-15 ENCOUNTER — Emergency Department (HOSPITAL_COMMUNITY): Payer: Medicare Other

## 2018-10-15 ENCOUNTER — Telehealth: Payer: Self-pay | Admitting: Student

## 2018-10-15 ENCOUNTER — Encounter (HOSPITAL_COMMUNITY): Payer: Self-pay | Admitting: *Deleted

## 2018-10-15 ENCOUNTER — Other Ambulatory Visit: Payer: Self-pay

## 2018-10-15 DIAGNOSIS — I3139 Other pericardial effusion (noninflammatory): Secondary | ICD-10-CM | POA: Diagnosis present

## 2018-10-15 DIAGNOSIS — E785 Hyperlipidemia, unspecified: Secondary | ICD-10-CM | POA: Diagnosis present

## 2018-10-15 DIAGNOSIS — I080 Rheumatic disorders of both mitral and aortic valves: Secondary | ICD-10-CM | POA: Diagnosis not present

## 2018-10-15 DIAGNOSIS — Z8249 Family history of ischemic heart disease and other diseases of the circulatory system: Secondary | ICD-10-CM

## 2018-10-15 DIAGNOSIS — Z79899 Other long term (current) drug therapy: Secondary | ICD-10-CM

## 2018-10-15 DIAGNOSIS — I110 Hypertensive heart disease with heart failure: Secondary | ICD-10-CM | POA: Diagnosis present

## 2018-10-15 DIAGNOSIS — I4819 Other persistent atrial fibrillation: Secondary | ICD-10-CM | POA: Diagnosis present

## 2018-10-15 DIAGNOSIS — Z841 Family history of disorders of kidney and ureter: Secondary | ICD-10-CM

## 2018-10-15 DIAGNOSIS — Z888 Allergy status to other drugs, medicaments and biological substances status: Secondary | ICD-10-CM

## 2018-10-15 DIAGNOSIS — K219 Gastro-esophageal reflux disease without esophagitis: Secondary | ICD-10-CM | POA: Diagnosis present

## 2018-10-15 DIAGNOSIS — R55 Syncope and collapse: Secondary | ICD-10-CM | POA: Diagnosis not present

## 2018-10-15 DIAGNOSIS — Z20828 Contact with and (suspected) exposure to other viral communicable diseases: Secondary | ICD-10-CM | POA: Diagnosis present

## 2018-10-15 DIAGNOSIS — I5032 Chronic diastolic (congestive) heart failure: Secondary | ICD-10-CM | POA: Diagnosis present

## 2018-10-15 DIAGNOSIS — Z880 Allergy status to penicillin: Secondary | ICD-10-CM

## 2018-10-15 DIAGNOSIS — I10 Essential (primary) hypertension: Secondary | ICD-10-CM | POA: Diagnosis present

## 2018-10-15 DIAGNOSIS — F419 Anxiety disorder, unspecified: Secondary | ICD-10-CM | POA: Diagnosis not present

## 2018-10-15 DIAGNOSIS — I313 Pericardial effusion (noninflammatory): Secondary | ICD-10-CM | POA: Diagnosis present

## 2018-10-15 DIAGNOSIS — H409 Unspecified glaucoma: Secondary | ICD-10-CM | POA: Diagnosis present

## 2018-10-15 DIAGNOSIS — E876 Hypokalemia: Secondary | ICD-10-CM | POA: Diagnosis present

## 2018-10-15 DIAGNOSIS — Z7901 Long term (current) use of anticoagulants: Secondary | ICD-10-CM

## 2018-10-15 DIAGNOSIS — I272 Pulmonary hypertension, unspecified: Secondary | ICD-10-CM | POA: Diagnosis present

## 2018-10-15 DIAGNOSIS — Z833 Family history of diabetes mellitus: Secondary | ICD-10-CM

## 2018-10-15 DIAGNOSIS — G4733 Obstructive sleep apnea (adult) (pediatric): Secondary | ICD-10-CM | POA: Diagnosis present

## 2018-10-15 HISTORY — DX: Heart failure, unspecified: I50.9

## 2018-10-15 LAB — COMPREHENSIVE METABOLIC PANEL
ALT: 19 U/L (ref 0–44)
AST: 21 U/L (ref 15–41)
Albumin: 3.8 g/dL (ref 3.5–5.0)
Alkaline Phosphatase: 60 U/L (ref 38–126)
Anion gap: 7 (ref 5–15)
BUN: 14 mg/dL (ref 8–23)
CO2: 31 mmol/L (ref 22–32)
Calcium: 9.5 mg/dL (ref 8.9–10.3)
Chloride: 97 mmol/L — ABNORMAL LOW (ref 98–111)
Creatinine, Ser: 0.88 mg/dL (ref 0.44–1.00)
GFR calc Af Amer: >60 mL/min (ref >60)
GFR calc non Af Amer: >60 mL/min (ref >60)
Glucose, Bld: 124 mg/dL — ABNORMAL HIGH (ref 70–99)
Potassium: 4.1 mmol/L (ref 3.5–5.1)
Sodium: 135 mmol/L (ref 135–145)
Total Bilirubin: 1 mg/dL (ref 0.3–1.2)
Total Protein: 6.6 g/dL (ref 6.5–8.1)

## 2018-10-15 LAB — CBC
HCT: 42.2 % (ref 36.0–46.0)
Hemoglobin: 13.6 g/dL (ref 12.0–15.0)
MCH: 31.8 pg (ref 26.0–34.0)
MCHC: 32.2 g/dL (ref 30.0–36.0)
MCV: 98.6 fL (ref 80.0–100.0)
Platelets: 206 10*3/uL (ref 150–400)
RBC: 4.28 MIL/uL (ref 3.87–5.11)
RDW: 13.4 % (ref 11.5–15.5)
WBC: 6.2 10*3/uL (ref 4.0–10.5)
nRBC: 0 % (ref 0.0–0.2)

## 2018-10-15 LAB — URINALYSIS, ROUTINE W REFLEX MICROSCOPIC
Bacteria, UA: NONE SEEN
Bilirubin Urine: NEGATIVE
Glucose, UA: NEGATIVE mg/dL
Ketones, ur: NEGATIVE mg/dL
Leukocytes,Ua: NEGATIVE
Nitrite: NEGATIVE
Protein, ur: NEGATIVE mg/dL
Specific Gravity, Urine: 1.01 (ref 1.005–1.030)
pH: 7 (ref 5.0–8.0)

## 2018-10-15 LAB — TROPONIN I (HIGH SENSITIVITY): Troponin I (High Sensitivity): 5 ng/L (ref <18)

## 2018-10-15 LAB — CBG MONITORING, ED: Glucose-Capillary: 122 mg/dL — ABNORMAL HIGH (ref 70–99)

## 2018-10-15 MED ORDER — DOXEPIN HCL 10 MG PO CAPS
10.0000 mg | ORAL_CAPSULE | Freq: Every evening | ORAL | Status: DC | PRN
  Administered 2018-10-17: 10 mg via ORAL
  Administered 2018-10-18: 20 mg via ORAL
  Filled 2018-10-15 (×5): qty 2

## 2018-10-15 MED ORDER — CIPROFLOXACIN IN D5W 400 MG/200ML IV SOLN
400.0000 mg | Freq: Two times a day (BID) | INTRAVENOUS | Status: DC
  Administered 2018-10-16: 400 mg via INTRAVENOUS
  Filled 2018-10-15: qty 200

## 2018-10-15 MED ORDER — PANTOPRAZOLE SODIUM 40 MG PO TBEC
40.0000 mg | DELAYED_RELEASE_TABLET | Freq: Every day | ORAL | Status: DC
  Administered 2018-10-16 – 2018-10-20 (×5): 40 mg via ORAL
  Filled 2018-10-15 (×5): qty 1

## 2018-10-15 MED ORDER — LORATADINE 10 MG PO TABS
10.0000 mg | ORAL_TABLET | Freq: Every day | ORAL | Status: DC
  Administered 2018-10-17 – 2018-10-20 (×4): 10 mg via ORAL
  Filled 2018-10-15 (×5): qty 1

## 2018-10-15 MED ORDER — DORZOLAMIDE HCL-TIMOLOL MAL 2-0.5 % OP SOLN: 1.0000 [drp] | Freq: Every day | OPHTHALMIC | Status: DC

## 2018-10-15 MED ORDER — LOSARTAN POTASSIUM 50 MG PO TABS
50.0000 mg | ORAL_TABLET | Freq: Every day | ORAL | Status: DC
  Administered 2018-10-16: 50 mg via ORAL
  Filled 2018-10-15: qty 1

## 2018-10-15 MED ORDER — CARVEDILOL 12.5 MG PO TABS
12.5000 mg | ORAL_TABLET | Freq: Two times a day (BID) | ORAL | Status: DC
  Administered 2018-10-16 (×2): 12.5 mg via ORAL
  Filled 2018-10-15 (×3): qty 1

## 2018-10-15 MED ORDER — METRONIDAZOLE IN NACL 5-0.79 MG/ML-% IV SOLN
500.0000 mg | Freq: Four times a day (QID) | INTRAVENOUS | Status: DC
  Administered 2018-10-16: 500 mg via INTRAVENOUS
  Filled 2018-10-15 (×2): qty 100

## 2018-10-15 MED ORDER — HYDROCHLOROTHIAZIDE 12.5 MG PO CAPS
12.5000 mg | ORAL_CAPSULE | Freq: Every day | ORAL | Status: DC
  Administered 2018-10-16 – 2018-10-20 (×5): 12.5 mg via ORAL
  Filled 2018-10-15 (×5): qty 1

## 2018-10-15 MED ORDER — FLECAINIDE ACETATE 100 MG PO TABS
100.0000 mg | ORAL_TABLET | Freq: Two times a day (BID) | ORAL | Status: DC
  Administered 2018-10-15 – 2018-10-20 (×10): 100 mg via ORAL
  Filled 2018-10-15 (×12): qty 1

## 2018-10-15 MED ORDER — SODIUM CHLORIDE 0.9 % IV SOLN: 250.0000 mL | INTRAVENOUS | Status: DC | PRN

## 2018-10-15 MED ORDER — FAMOTIDINE 20 MG PO TABS
20.0000 mg | ORAL_TABLET | Freq: Every day | ORAL | Status: DC
  Administered 2018-10-16 – 2018-10-19 (×4): 20 mg via ORAL
  Filled 2018-10-15 (×4): qty 1

## 2018-10-15 MED ORDER — SODIUM CHLORIDE 0.9% FLUSH: 3.0000 mL | INTRAVENOUS | Status: DC | PRN

## 2018-10-15 MED ORDER — POLYETHYLENE GLYCOL 3350 17 G PO PACK: 17.0000 g | PACK | Freq: Every day | ORAL | Status: DC | PRN

## 2018-10-15 MED ORDER — GABAPENTIN 300 MG PO CAPS
300.0000 mg | ORAL_CAPSULE | Freq: Three times a day (TID) | ORAL | Status: DC
  Administered 2018-10-15 – 2018-10-20 (×14): 300 mg via ORAL
  Filled 2018-10-15 (×14): qty 1

## 2018-10-15 MED ORDER — ALPRAZOLAM 0.25 MG PO TABS
0.1250 mg | ORAL_TABLET | Freq: Two times a day (BID) | ORAL | Status: DC | PRN
  Administered 2018-10-16 – 2018-10-19 (×6): 0.125 mg via ORAL
  Filled 2018-10-15 (×6): qty 1

## 2018-10-15 MED ORDER — OXYBUTYNIN CHLORIDE ER 10 MG PO TB24
10.0000 mg | ORAL_TABLET | Freq: Every day | ORAL | Status: DC
  Administered 2018-10-16 – 2018-10-20 (×5): 10 mg via ORAL
  Filled 2018-10-15 (×6): qty 1

## 2018-10-15 MED ORDER — APIXABAN 5 MG PO TABS
5.0000 mg | ORAL_TABLET | Freq: Two times a day (BID) | ORAL | Status: DC
  Administered 2018-10-15 – 2018-10-16 (×2): 5 mg via ORAL
  Filled 2018-10-15 (×3): qty 1

## 2018-10-15 MED ORDER — SODIUM CHLORIDE 0.9% FLUSH
3.0000 mL | Freq: Two times a day (BID) | INTRAVENOUS | Status: DC
  Administered 2018-10-15 – 2018-10-16 (×2): 3 mL via INTRAVENOUS

## 2018-10-15 MED ORDER — SODIUM CHLORIDE 0.9% FLUSH: 3.0000 mL | Freq: Once | INTRAVENOUS | Status: DC

## 2018-10-15 MED ORDER — ACETAMINOPHEN 500 MG PO TABS
500.0000 mg | ORAL_TABLET | Freq: Four times a day (QID) | ORAL | Status: DC | PRN
  Administered 2018-10-19: 500 mg via ORAL
  Filled 2018-10-15: qty 1

## 2018-10-15 MED ORDER — ESTROGENS, CONJUGATED 0.625 MG/GM VA CREA
1.0000 | TOPICAL_CREAM | Freq: Every day | VAGINAL | Status: DC | PRN
  Filled 2018-10-15: qty 30

## 2018-10-15 MED ORDER — TRAMADOL HCL 50 MG PO TABS: 50.0000 mg | ORAL_TABLET | Freq: Four times a day (QID) | ORAL | Status: DC | PRN

## 2018-10-15 MED ORDER — LATANOPROST 0.005 % OP SOLN: 1.0000 [drp] | Freq: Every day | OPHTHALMIC | Status: DC

## 2018-10-15 NOTE — ED Notes (Signed)
Dinner tray ordered.

## 2018-10-15 NOTE — ED Provider Notes (Signed)
Medical screening examination/treatment/procedure(s) were conducted as a shared visit with non-physician practitioner(s) and myself.  I personally evaluated the patient during the encounter.  EKG Interpretation  Date/Time:  Sunday October 15 2018 12:46:02 EDT Ventricular Rate:  56 PR Interval:  274 QRS Duration: 104 QT Interval:  456 QTC Calculation: 440 R Axis:   90 Text Interpretation:  Sinus bradycardia with 1st degree A-V block Rightward axis Incomplete right bundle branch block Nonspecific ST and T wave abnormality Abnormal ECG Confirmed by Lacretia Leigh ([id number]) on 10/15/2018 3:100:64 PM 73 year old female here after syncopal event just prior to arrival.  Neurological exam here is stable.  Head CT negative.  Will admit for syncope work-up   Lacretia Leigh, MD 10/15/18 [id number]

## 2018-10-15 NOTE — H&P (Signed)
History and Physical    Mary Farrell [id number] DOB: [birth year] DOA: 10/15/2018  PCP: Lawerance Cruel, MD (Confirm with patient/family/NH records and if not entered, this has to be entered at [facility] point of entry) Patient coming from: patient is coming from home  I have personally briefly reviewed patient's old medical records in [place]  Chief Complaint: passed out  HPI: Mary Farrell is a 73 y.o. female with medical history significant of a. Fib s/p ablation procedure x 2, last in May '19. Last TEE 06/23/17 nl LV, mild decrease LV fxn, AV with mild eccentric AI, mild MR, LA enlarged, small ASD with L to R flow. She is followed [facility] with last visit 07/20/18. She has been in sinus rhythm but did have an episode of a fib lasting 18 hrs. Her CHA2DS2-VASc score is 3 and she takes Eliguis. For several days she has been having episodes of feeling light-headed, a flushed feeling that starts in legs and rises to her head, a sense that sounds are receeding and an awareness of her heart beat. She has taken her BP and reports a systolic pressure in the [id number]. Of note her dose or losartan was recently decreased from 100mg  to 50 mg.  She did send a cardiac tracing to her Cardiologist and had a undetermined rhythm. On the day of adm she was in the kitchen, felt light headed and woozy. The next thing she remembers is being awakened by her husband. Length of LOC less than two minutes. Her husband reports that he saw she was falling and got to her before she actually fell and eased her down. By his report she appeared very pale with bruised appearing lips during the episode. He did not witness any abnormal movement. She was confused for a minute or two but rapidly regained full consciousness. She believes she had moved several feet to the side and fell backward. She hit her head but was not injured. After regaining full consciousness she called cardiology on call and was referred to ED.  ED  Course: Hemodynamically stable in ED. She was awake and alert. CT head and neck without injury or abnormality. EKG with sinus brady, RBBB. Cardiology was consulted and telemetry admission was recommended along with 2D echo. [facility] called to admit.   Review of Systems: As per HPI otherwise 10 point review of systems negative. She does not report rapid or irregular heart rate during the episode described in the HPI.    Past Medical History:  Diagnosis Date  . Anxiety 06/29/2012  . Aortic regurgitation 08/13/2015  . Arthritis    "hands, wrists, back, feet, toes" (06/24/2017)  . Atrial flutter ([place]) 09/18/2015  . Atypical chest pain 05/13/2016  . Chest pain    remote cath in 1996 with NORMAL coronaries noted  . CHF (congestive heart failure) ([place])   . Dyspnea 10/30/2010  . Essential hypertension 09/05/2013  . Exogenous obesity    history of   . GERD (gastroesophageal reflux disease)   . Glaucoma, both eyes   . Headache, migraine    "stopped in my 50's" (09/18/2015)  . History of cardiovascular stress test 2007   showing no ischemia  . Hypertension   . Mitral stenosis and aortic insufficiency 06/23/2010  . Mitral stenosis with insufficiency   . Murmur    of mitral stenosis and moderate aortic insufficiency      . Nausea 05/01/2018  . OSA on CPAP   . Palpitations  occasional  . Paroxysmal A-fib ([place]) 06/24/2017  . Pericardial effusion 09/18/2015  . Persistent atrial fibrillation 03/10/2017  . Personal history of rheumatic heart disease   . SBE (subacute bacterial endocarditis)    prophylaxis  . Sliding hiatal hernia   . Vertigo 05/01/2018    Past Surgical History:  Procedure Laterality Date  . ABDOMINAL HYSTERECTOMY  1975  . APPENDECTOMY  1977  . ATRIAL FIBRILLATION ABLATION N/A 03/10/2017   Procedure: ATRIAL FIBRILLATION ABLATION;  Surgeon: [name], MD;  Location: [place] [facility];  Service: Cardiovascular;  Laterality: N/A;  . ATRIAL FIBRILLATION ABLATION  06/24/2017  .  ATRIAL FIBRILLATION ABLATION N/A 06/24/2017   Procedure: ATRIAL FIBRILLATION ABLATION;  Surgeon: [name], MD;  Location: [place] [facility];  Service: Cardiovascular;  Laterality: N/A;  . BUNIONECTOMY Right 2000s  . CARDIAC CATHETERIZATION  01/13/1995   normal coronary anatomy and mild mitral stenosis and mild pulmonary hypertension  . CARDIOVERSION N/A 09/22/2015   Procedure: CARDIOVERSION;  Surgeon: Jerline Pain, MD;  Location: [place];  Service: Cardiovascular;  Laterality: N/A;  . CARDIOVERSION N/A 10/25/2016   Procedure: CARDIOVERSION;  Surgeon: Fay Records, MD;  Location: [facility] ENDOSCOPY;  Service: Cardiovascular;  Laterality: N/A;  . CARDIOVERSION N/A 04/15/2017   Procedure: CARDIOVERSION;  Surgeon: Josue Hector, MD;  Location: [facility] ENDOSCOPY;  Service: Cardiovascular;  Laterality: N/A;  . CARDIOVERSION N/A 05/16/2017   Procedure: CARDIOVERSION;  Surgeon: Pixie Casino, MD;  Location: [facility] ENDOSCOPY;  Service: Cardiovascular;  Laterality: N/A;  . CATARACT EXTRACTION W/ INTRAOCULAR LENS  IMPLANT, BILATERAL Bilateral 2013  . LAPAROSCOPIC CHOLECYSTECTOMY  1997  . TEE WITHOUT CARDIOVERSION N/A 06/23/2017   Procedure: TRANSESOPHAGEAL ECHOCARDIOGRAM (TEE);  Surgeon: Fay Records, MD;  Location: [facility] ENDOSCOPY;  Service: Cardiovascular;  Laterality: N/A;  . TONSILLECTOMY AND ADENOIDECTOMY  1990s  . UVULOPALATOPHARYNGOPLASTY, TONSILLECTOMY AND SEPTOPLASTY  1990s   Soc Hx - finished 10th grade. Married at 64 and had two sons, divorced after 6 years. Remarried and had 1 son, divorced after 10 years. Remarried x 6 years. Remarried 32 years ago and remains married. She has 2 grand-daughters, 2 great-grandaughters. Denies any h/o abuse. She completed her GED and has correspondence courses under her belt. She worked 28 years at [employer] in [place] retiring in 2010. She lives with her husband.   reports that she has never smoked. She has never used smokeless tobacco. She reports that  she does not drink alcohol or use drugs.  Allergies  Allergen Reactions  . Amoxicillin Diarrhea    Has patient had a PCN reaction causing immediate rash, facial/tongue/throat swelling, SOB or lightheadedness with hypotension: no Has patient had a PCN reaction causing severe rash involving mucus membranes or skin necrosis: no Has patient had a PCN reaction that required hospitalization: no Has patient had a PCN reaction occurring within the last 10 years: no If all of the above answers are "NO", then may proceed with Cephalosporin use.   . Metoprolol Other (See Comments)    Mouth tastes like mold  . Metoprolol Tartrate     Other reaction(s): Other (See Comments) Mouth sores  . Oxaprozin Nausea Only    Family History  Problem Relation Age of Onset  . Heart attack Mother   . Hypertension Brother   . Kidney disease Brother   . Diabetes Brother   . Heart failure Maternal Grandfather      Prior to Admission medications   Medication Sig Start Date End Date Taking?  Authorizing Provider  acetaminophen (TYLENOL) 500 MG tablet Take 500 mg by mouth every 6 (six) hours as needed for moderate pain or headache.   Yes [provider]  ALPRAZolam (XANAX) 0.25 MG tablet Take 0.125 mg by mouth 2 (two) times daily as needed for anxiety.    Yes [provider]  carvedilol (COREG) 12.5 MG tablet Take 1 tablet (12.5 mg total) by mouth 2 (two) times daily with a meal. 08/01/18  Yes Camnitz, Will Hassell Done, MD  cetirizine (ZYRTEC) 10 MG tablet Take 10 mg by mouth at bedtime.    Yes [provider]  Cholecalciferol (VITAMIN D) 2000 UNITS CAPS Take 2,000 Units by mouth daily.     Yes [provider]  conjugated estrogens (PREMARIN) vaginal cream Place 1 Applicatorful vaginally daily as needed (irritation).    Yes [provider]  dorzolamide-timolol (COSOPT) 22.3-6.8 MG/ML ophthalmic solution Place 1 drop into both eyes daily.   Yes [provider]  doxepin  (SINEQUAN) 10 MG capsule Take 10-20 mg by mouth at bedtime as needed (sleep).  06/10/17  Yes [provider]  ELIQUIS 5 MG TABS tablet TAKE 1 TABLET BY MOUTH TWO  TIMES DAILY Patient taking differently: Take 5 mg by mouth 2 (two) times daily.  06/20/18  Yes Skeet Latch, MD  famotidine (PEPCID) 20 MG tablet Take 20 mg by mouth at bedtime.   Yes [provider]  flecainide (TAMBOCOR) 50 MG tablet TAKE 2 TABLETS BY MOUTH TWO TIMES DAILY Patient taking differently: Take 100 mg by mouth 2 (two) times daily.  02/21/18  Yes Baldwin Jamaica, PA-C  gabapentin (NEURONTIN) 300 MG capsule Take 300 mg by mouth 3 (three) times daily.    Yes [provider]  hydrochlorothiazide (MICROZIDE) 12.5 MG capsule TAKE 1 CAPSULE BY MOUTH  DAILY Patient taking differently: Take 12.5 mg by mouth daily.  03/21/18  Yes Skeet Latch, MD  lactobacillus acidophilus (BACID) TABS tablet Take 1 tablet by mouth daily.    Yes [provider]  latanoprost (XALATAN) 0.005 % ophthalmic solution Place 1 drop into both eyes at bedtime.  06/17/14  Yes [provider]  losartan (COZAAR) 50 MG tablet Take 50 mg by mouth daily.   Yes [provider]  Multiple Vitamin (MULTIVITAMIN WITH MINERALS) TABS tablet Take 1 tablet by mouth daily.   Yes [provider]  omeprazole (PRILOSEC) 40 MG capsule Take 40 mg by mouth daily.    Yes [provider]  oxybutynin (DITROPAN-XL) 10 MG 24 hr tablet Take 10 mg by mouth daily.  12/16/15  Yes [provider]  polyethylene glycol (MIRALAX / GLYCOLAX) 17 g packet Take 17 g by mouth See admin instructions. 3-4 times a week   Yes [provider]  PRESCRIPTION MEDICATION Inhale into the lungs at bedtime. CPAP   Yes [provider]  NON FORMULARY Apply 1 application topically 2 (two) times daily as needed (for eczema). Traimcinolone/[facility] Moist Cream    [provider]    Physical Exam: Vitals:    10/15/18 1730 10/15/18 1800 10/15/18 1830 10/15/18 1845  BP:    (!) 147/67  Pulse: 70 70 72 73  Resp: 13 14 (!) 22 15  Temp:      TempSrc:      SpO2: 100% 100% 100% 100%  Weight:      Height:        Constitutional: NAD, calm, comfortable Vitals:   10/15/18 1730 10/15/18 1800 10/15/18 1830 10/15/18 1845  BP:    (!) 147/67  Pulse: 70 70 72 73  Resp: 13 14 (!) 22 15  Temp:      TempSrc:      SpO2: 100% 100% 100% 100%  Weight:      Height:       General appearance::  WNWD woman in no distress. Conversant and good historian Eyes: PERRL-s/p cataract extraction, lids and conjunctivae normal ENMT: Mucous membranes are moist. Posterior pharynx clear of any exudate or lesions.Normal dentition with a partial upper denture. .  Neck: normal, supple, no masses, no thyromegaly Respiratory: clear to auscultation bilaterally, no wheezing, no crackles. Normal respiratory effort. No accessory muscle use.  Cardiovascular: Regular bradycardia, II/VI systolic mm best at RSB / no rubs / no gallops with split S2. No extremity edema. 2+ pedal pulses. No carotid bruits.  Abdomen: no tenderness, no masses palpated. No hepatosplenomegaly. Bowel sounds positive.  Musculoskeletal: no clubbing / cyanosis. No joint deformity upper and lower extremities. Good ROM, no contractures. Normal muscle tone.  Skin: no rashes, lesions, ulcers. No induration Neurologic: CN 2-12 grossly intact. Sensation intact, DTR normal. Strength 5/5 in all 4.  Psychiatric: Normal judgment and insight. Alert and oriented x 3. Normal mood.     Labs on Admission: I have personally reviewed following labs and imaging studies  CBC: Recent Labs  Lab 10/15/18 1259  WBC 6.2  HGB 13.6  HCT 42.2  MCV 98.6  PLT 99991111   Basic Metabolic Panel: Recent Labs  Lab 10/15/18 1259  NA 135  K 4.1  CL 97*  CO2 31  GLUCOSE 124*  BUN 14  CREATININE 0.88  CALCIUM 9.5   GFR: Estimated Creatinine Clearance: 49 mL/min (by C-G formula based  on SCr of 0.88 mg/dL). Liver Function Tests: Recent Labs  Lab 10/15/18 1259  AST 21  ALT 19  ALKPHOS 60  BILITOT 1.0  PROT 6.6  ALBUMIN 3.8   No results for input(s): LIPASE, AMYLASE in the last 168 hours. No results for input(s): AMMONIA in the last 168 hours. Coagulation Profile: No results for input(s): INR, PROTIME in the last 168 hours. Cardiac Enzymes: No results for input(s): CKTOTAL, CKMB, CKMBINDEX, TROPONINI in the last 168 hours. BNP (last 3 results) No results for input(s): PROBNP in the last 8760 hours. HbA1C: No results for input(s): HGBA1C in the last 72 hours. CBG: Recent Labs  Lab 10/15/18 1257  GLUCAP 122*   Lipid Profile: No results for input(s): CHOL, HDL, LDLCALC, TRIG, CHOLHDL, LDLDIRECT in the last 72 hours. Thyroid Function Tests: No results for input(s): TSH, T4TOTAL, FREET4, T3FREE, THYROIDAB in the last 72 hours. Anemia Panel: No results for input(s): VITAMINB12, FOLATE, FERRITIN, TIBC, IRON, RETICCTPCT in the last 72 hours. Urine analysis:    Component Value Date/Time   COLORURINE YELLOW 10/15/2018 [place] 10/15/2018 1554   LABSPEC 1.010 10/15/2018 1554   PHURINE 7.0 10/15/2018 1554   GLUCOSEU NEGATIVE 10/15/2018 1554   HGBUR SMALL (A) 10/15/2018 1554   BILIRUBINUR NEGATIVE 10/15/2018 [place] 10/15/2018 1554   PROTEINUR NEGATIVE 10/15/2018 1554   NITRITE NEGATIVE 10/15/2018 [place] 10/15/2018 1554    Radiological Exams on Admission: Dg Chest 2 View  Result Date: 10/15/2018 CLINICAL DATA:  Chest pain EXAM: CHEST - 2 VIEW COMPARISON:  May 15, 2017 FINDINGS: There is cardiomegaly. Both lungs are clear. The visualized skeletal structures are unremarkable. IMPRESSION: No active cardiopulmonary disease.  Cardiomegaly Electronically Signed   By: Kerby Moors  [place] M.D.   On: 10/15/2018 15:39   Ct Head Wo Contrast  Result Date: 10/15/2018 CLINICAL DATA:  Syncope, right-sided scalp  hematoma EXAM: CT HEAD WITHOUT CONTRAST CT CERVICAL SPINE WITHOUT CONTRAST TECHNIQUE: Multidetector CT imaging of the head and cervical spine was performed following the standard protocol without intravenous contrast. Multiplanar CT image reconstructions of the cervical spine were also generated. COMPARISON:  None. FINDINGS: CT HEAD FINDINGS Brain: No evidence of acute infarction, hemorrhage, hydrocephalus, extra-axial collection or mass lesion/mass effect. Vascular: No hyperdense vessel or unexpected calcification. Skull: Normal. Negative for fracture or focal lesion. Sinuses/Orbits: No acute finding. Other: None. CT CERVICAL SPINE FINDINGS Alignment: 3 mm anterolisthesis C2 on C3. Facet joints are aligned without dislocation. Skull base and vertebrae: No acute fracture. No primary bone lesion or focal pathologic process. Soft tissues and spinal canal: No prevertebral fluid or swelling. No visible canal hematoma. Disc levels: Multilevel intervertebral disc height loss with degenerative endplate spurring, most pronounced at C5-6 and C6-7. Multilevel mild facet and uncovertebral arthropathy. No evidence of high-grade canal stenosis. Upper chest: Negative. Other: None. IMPRESSION: 1. No acute intracranial findings. 2. No acute fracture or dislocation of the cervical spine. 3. Mild-moderate multilevel degenerative changes of the cervical spine. Electronically Signed   By: Davina Poke M.D.   On: 10/15/2018 15:31   Ct Cervical Spine Wo Contrast  Result Date: 10/15/2018 CLINICAL DATA:  Syncope, right-sided scalp hematoma EXAM: CT HEAD WITHOUT CONTRAST CT CERVICAL SPINE WITHOUT CONTRAST TECHNIQUE: Multidetector CT imaging of the head and cervical spine was performed following the standard protocol without intravenous contrast. Multiplanar CT image reconstructions of the cervical spine were also generated. COMPARISON:  None. FINDINGS: CT HEAD FINDINGS Brain: No evidence of acute infarction, hemorrhage,  hydrocephalus, extra-axial collection or mass lesion/mass effect. Vascular: No hyperdense vessel or unexpected calcification. Skull: Normal. Negative for fracture or focal lesion. Sinuses/Orbits: No acute finding. Other: None. CT CERVICAL SPINE FINDINGS Alignment: 3 mm anterolisthesis C2 on C3. Facet joints are aligned without dislocation. Skull base and vertebrae: No acute fracture. No primary bone lesion or focal pathologic process. Soft tissues and spinal canal: No prevertebral fluid or swelling. No visible canal hematoma. Disc levels: Multilevel intervertebral disc height loss with degenerative endplate spurring, most pronounced at C5-6 and C6-7. Multilevel mild facet and uncovertebral arthropathy. No evidence of high-grade canal stenosis. Upper chest: Negative. Other: None. IMPRESSION: 1. No acute intracranial findings. 2. No acute fracture or dislocation of the cervical spine. 3. Mild-moderate multilevel degenerative changes of the cervical spine. Electronically Signed   By: Davina Poke M.D.   On: 10/15/2018 15:31    EKG: Independently reviewed. Sinus Bradycardia at 56, incomplete RBBB, 1st AVB.  Assessment/Plan Active Problems:   Syncope   GERD (gastroesophageal reflux disease)   Anxiety   Essential hypertension   Paroxysmal A-fib (HCC)  (please populate well all problems here in Problem List. (For example, if patient is on BP meds at home and you resume or decide to hold them, it is a problem that needs to be her. Same for CAD, COPD, HLD and so on)   1. Syncope - a several week h/o intermittent episodes of light-headedness with flushing. Several documented episodes of SBP in th 90's. She had a episode of LOC that was not sudden or abrupt followed by a rapid recovery. Cardiogenic syncope vs vaso-vagal syncope, favoring the later. Neuro exam normal Plan Admit to telemetry observation  Activity as tolerated - to be up and about while on telemetry  Orthostatic vitals q shift x 9  Continue  cardiac home meds.  2. HTN- question of hypotensive episodes. Plan Continue home meds  orthosatic vitals q shift  3. Anxiety - continue home meds.   4. Elevated serum glucose at admission Plan A1C  DVT prophylaxis: on Eliquis (Lovenox/Heparin/SCD's/anticoagulated/None (if comfort care) Code Status: full code (Full/Partial (specify details) Family Communication: spoke with spouse - discussed dx and tx plan. Answered all questions. (Specify name, relationship. Do not write "discussed with patient". Specify tel # if discussed over the phone) Disposition Plan: home 36-48 hrs (specify when and where you expect patient to be discharged) Consults called: cardiology consult called by EDP (with names) Admission status: telemetry obs (inpatient / obs / tele / medical floor / SDU)   Adella Hare MD [facility] Pager [phone]  If 7PM-7AM, please contact night-coverage [email or web address] Password [email or web address]  10/15/2018, 7:45 PM

## 2018-10-15 NOTE — ED Triage Notes (Signed)
Pt was in kitchen this am and felt her head "get heavy" and fell to the ground.  States hematoma to R occiput.  Pt immediately came too. States did not feel well last night and was in afib.

## 2018-10-15 NOTE — ED Notes (Signed)
Patient ambulatory to restroom with steady gait. Denies dizziness.  

## 2018-10-15 NOTE — Telephone Encounter (Signed)
   Patient called the after-hours line reporting that she felt better this morning and heart rate had improved into the 60's, therefore she thought she had converted back to normal sinus rhythm.  However, later this morning she developed a headache and dizziness and actually had a syncopal event. Says she fell backwards in her kitchen and hit her head. She is still feeling weak at this time and reports a headache. I recommended that she come to the nearest ED for further evaluation as she will likely require a Head CT given she hit her head and is on chronic anticoagulation.  She voiced understanding of this and was appreciative of the return call.  Signed, Erma Heritage, PA-C 10/15/2018, 11:50 AM Pager: [phone]

## 2018-10-15 NOTE — ED Provider Notes (Signed)
[facility] EMERGENCY DEPARTMENT Provider Note   CSN: [id number] Arrival date & time: 10/15/18  1232     History   Chief Complaint Chief Complaint  Patient presents with   Loss of Consciousness    HPI Mary Farrell is a 73 y.o. female with PMHx aortic regurg, a fib/a flutter, CHF, HTN, GERD, mitral stenosis, who presents to the ED today for syncopal episode that occurred at 10:30 AM with head injury. Pt reports for the past few months she has had episodes of lightheadedness/dizziness. She states that last night she felt like she was in A fib due to feelings of palpitations. She has a cardia monitor at home and was able to send the EKG to her cardiologist - it did not appear that she was in a fib but the rhythm was undetermined. Pt states that she took 1.5 tablets of Xanax yesterday to help calm herself and tried to go to sleep without much success.  States when she woke up this morning she no longer had palpitations.  She was in the kitchen making herself a cup of coffee when she felt her head to get heavy and she had a feeling of lightheadedness.  The next thing she remembers is that she was on the ground and her husband was checking on her.  She does complain of some pain to the right occiput and does have hematoma present.  Patient is anticoagulated with Eliquis.  She reports discussing her incident with her neurologist and was told to come to the ED for further evaluation.  Patient denies any pain at this time.  She does state she had some chest heaviness yesterday with heart palpitations but otherwise has no complaints.  Denies fever, chills, cough, shortness of breath, nausea, vomiting, vision changes, unilateral weakness or numbness, speech difficulties, confusion, any other associated symptoms.        Past Medical History:  Diagnosis Date   Anxiety 06/29/2012   Aortic regurgitation 08/13/2015   Arthritis    "hands, wrists, back, feet, toes" (06/24/2017)   Atrial  flutter ([place]) 09/18/2015   Atypical chest pain 05/13/2016   Chest pain    remote cath in 1996 with NORMAL coronaries noted   CHF (congestive heart failure) ([place])    Dyspnea 10/30/2010   Essential hypertension 09/05/2013   Exogenous obesity    history of    GERD (gastroesophageal reflux disease)    Glaucoma, both eyes    Headache, migraine    "stopped in my 11's" (09/18/2015)   History of cardiovascular stress test 2007   showing no ischemia   Hypertension    Mitral stenosis and aortic insufficiency 06/23/2010   Mitral stenosis with insufficiency    Murmur    of mitral stenosis and moderate aortic insufficiency       Nausea 05/01/2018   OSA on CPAP    Palpitations    occasional   Paroxysmal A-fib ([place]) 06/24/2017   Pericardial effusion 09/18/2015   Persistent atrial fibrillation 03/10/2017   Personal history of rheumatic heart disease    SBE (subacute bacterial endocarditis)    prophylaxis   Sliding hiatal hernia    Vertigo 05/01/2018    Patient Active Problem List   Diagnosis Date Noted   Nausea 05/01/2018   Vertigo 05/01/2018   Lump on finger 12/30/2017   Pain in finger 12/29/2017   Paroxysmal A-fib ([place]) 06/24/2017   Persistent atrial fibrillation 03/10/2017   Asymptomatic microscopic hematuria 11/02/2016   Rash 06/08/2016  Atypical chest pain 05/13/2016   Atrial flutter ([place]) 09/18/2015   Pericardial effusion 09/18/2015   Aortic regurgitation 08/13/2015   Dysuria 09/24/2014   IC (interstitial cystitis) 09/24/2014   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   Essential hypertension 09/05/2013   Anxiety 06/29/2012   GERD (gastroesophageal reflux disease) 03/11/2011   Dyspnea 10/30/2010   Mitral stenosis and aortic insufficiency 06/23/2010    Past Surgical History:  Procedure Laterality Date   [employer] N/A 03/10/2017   Procedure: ATRIAL FIBRILLATION ABLATION;  Surgeon:  [name], MD;  Location: [facility];  Service: Cardiovascular;  Laterality: N/A;   ATRIAL FIBRILLATION ABLATION  06/24/2017   ATRIAL FIBRILLATION ABLATION N/A 06/24/2017   Procedure: ATRIAL FIBRILLATION ABLATION;  Surgeon: [name], MD;  Location: [facility];  Service: Cardiovascular;  Laterality: N/A;   BUNIONECTOMY Right 2000s   CARDIAC CATHETERIZATION  01/13/1995   normal coronary anatomy and mild mitral stenosis and mild pulmonary hypertension   CARDIOVERSION N/A 09/22/2015   Procedure: CARDIOVERSION;  Surgeon: Jerline Pain, MD;  Location: [facility];  Service: Cardiovascular;  Laterality: N/A;   CARDIOVERSION N/A 10/25/2016   Procedure: CARDIOVERSION;  Surgeon: Fay Records, MD;  Location: [facility];  Service: Cardiovascular;  Laterality: N/A;   CARDIOVERSION N/A 04/15/2017   Procedure: CARDIOVERSION;  Surgeon: Josue Hector, MD;  Location[facility] ENDOSCOPY;  Service: Cardiovascular;  Laterality: N/A;   CARDIOVERSION N/A 05/16/2017   Procedure: CARDIOVERSION;  Surgeon: Pixie Casino, MD;  Location: [facility] ENDOSCOPY;  Service: Cardiovascular;  Laterality: N/A;   CATARACT EXTRACTION W/ INTRAOCULAR LENS  IMPLANT, BILATERAL Bilateral 2013   LAPAROSCOPIC CHOLECYSTECTOMY  1997   TEE WITHOUT CARDIOVERSION N/A 06/23/2017   Procedure: TRANSESOPHAGEAL ECHOCARDIOGRAM (TEE);  Surgeon: Fay Records, MD;  Location: [facility];  Service: Cardiovascular;  Laterality: N/A;   TONSILLECTOMY AND ADENOIDECTOMY  1990s   UVULOPALATOPHARYNGOPLASTY, TONSILLECTOMY AND SEPTOPLASTY  1990s     OB History   No obstetric history on file.      Home Medications    Prior to Admission medications   Medication Sig Start Date End Date Taking? Authorizing Provider  acetaminophen (TYLENOL) 500 MG tablet Take 500 mg by mouth every 6 (six) hours as needed for moderate pain or headache.    [provider]  ALPRAZolam Duanne Moron) 0.25 MG tablet Take 0.125 mg by  mouth 2 (two) times daily as needed for anxiety.     [provider]  B Complex Vitamins (VITAMIN B COMPLEX PO) Take 1 capsule by mouth at bedtime.     [provider]  carvedilol (COREG) 12.5 MG tablet Take 1 tablet (12.5 mg total) by mouth 2 (two) times daily with a meal. 08/01/18   Camnitz, Ocie Doyne, MD  cetirizine (ZYRTEC) 10 MG tablet Take 10 mg by mouth daily.    [provider]  Cholecalciferol (VITAMIN D) 2000 UNITS CAPS Take 2,000 Units by mouth daily.      [provider]  conjugated estrogens (PREMARIN) vaginal cream Place 1 Applicatorful vaginally daily as needed (irritation).     [provider]  Docusate Calcium (STOOL SOFTENER PO) Take 1 capsule by mouth 2 (two) times daily as needed (for constipation).     [provider]  dorzolamide-timolol (COSOPT) 22.3-6.8 MG/ML ophthalmic solution Place 1 drop into both eyes daily.    [provider]  doxepin (SINEQUAN) 10 MG capsule Take 10-20 mg by mouth at  bedtime as needed (sleep). Take 1 to 2 tablets at bedtime as needed 06/10/17   [provider]  ELIQUIS 5 MG TABS tablet TAKE 1 TABLET BY MOUTH TWO  TIMES DAILY 06/20/18   Skeet Latch, MD  flecainide [facility]) 50 MG tablet TAKE 2 TABLETS BY MOUTH TWO TIMES DAILY 02/21/18   Baldwin Jamaica, PA-C  furosemide (LASIX) 20 MG tablet Take 1 tablet (20 mg total) by mouth daily as needed for fluid or edema (weight gain). 06/08/17   Sherran Needs, NP  gabapentin (NEURONTIN) 300 MG capsule Take 300 mg by mouth 3 (three) times daily.     [provider]  hydrochlorothiazide (MICROZIDE) 12.5 MG capsule TAKE 1 CAPSULE BY MOUTH  DAILY 03/21/18   Skeet Latch, MD  lactobacillus acidophilus (BACID) TABS tablet Take 2 tablets by mouth 3 (three) times daily.    [provider]  latanoprost (XALATAN) 0.005 % ophthalmic solution Place 1 drop into both eyes at bedtime.  06/17/14   [provider]   losartan (COZAAR) 50 MG tablet Take 50 mg by mouth daily.    [provider]  Multiple Vitamin (MULTIVITAMIN WITH MINERALS) TABS tablet Take 1 tablet by mouth daily.    [provider]  NON FORMULARY Apply 1 application topically 2 (two) times daily as needed (for eczema). Traimcinolone/[facility] Moist Cream    [provider]  omeprazole (PRILOSEC) 40 MG capsule Take 40 mg by mouth daily.     [provider]  ondansetron (ZOFRAN) 4 MG tablet Take 4 mg by mouth every 8 (eight) hours as needed for nausea or vomiting.    [provider]  oxybutynin (DITROPAN-XL) 10 MG 24 hr tablet Take 10 mg by mouth daily.  12/16/15   [provider]  PRESCRIPTION MEDICATION Inhale into the lungs at bedtime. CPAP    [provider]  promethazine (PHENERGAN) 25 MG tablet Take 1 tablet (25 mg total) by mouth every 6 (six) hours as needed for nausea. 05/15/17   Domenic Moras, PA-C    Family History Family History  Problem Relation Age of Onset   Heart attack Mother    Hypertension Brother    Kidney disease Brother    Diabetes Brother    Heart failure Maternal Grandfather     Social History Social History   Tobacco Use   Smoking status: Never Smoker   Smokeless tobacco: Never Used  Substance Use Topics   Alcohol use: No   Drug use: No     Allergies   Amoxicillin, Metoprolol, Metoprolol tartrate, and Oxaprozin   Review of Systems Review of Systems  Constitutional: Negative for chills and fever.  HENT: Negative for congestion.   Eyes: Negative for visual disturbance.  Respiratory: Negative for cough and shortness of breath.   Cardiovascular: Positive for chest pain and palpitations.  Gastrointestinal: Negative for abdominal pain, nausea and vomiting.  Genitourinary: Negative for difficulty urinating.  Musculoskeletal: Negative for myalgias, neck pain and neck stiffness.  Skin: Negative for rash.  Neurological: Positive for  syncope and light-headedness. Negative for headaches.     Physical Exam Updated Vital Signs BP 103/87 (BP Location: Right Arm)    Pulse (!) 59    Temp 98 F (36.7 C) (Oral)    Resp 16    Ht 5' (1.524 m)    Wt 70 kg    SpO2 97%    BMI 30.15 kg/m   Physical Exam Vitals signs and nursing note reviewed.  Constitutional:  Appearance: She is not ill-appearing.  HENT:     Head: Normocephalic.     Comments: No raccoon's sign or battle's sign. Negative hemotympanum bilaterally. Small hematoma present to right occiput.     Right Ear: Tympanic membrane normal.     Left Ear: Tympanic membrane normal.  Eyes:     Extraocular Movements: Extraocular movements intact.     Conjunctiva/sclera: Conjunctivae normal.     Pupils: Pupils are equal, round, and reactive to light.  Neck:     Musculoskeletal: Neck supple.  Cardiovascular:     Rate and Rhythm: Normal rate and regular rhythm.     Pulses: Normal pulses.     Heart sounds: Murmur present.  Pulmonary:     Effort: Pulmonary effort is normal.     Breath sounds: Normal breath sounds. No wheezing, rhonchi or rales.  Chest:     Chest wall: No tenderness.  Abdominal:     Palpations: Abdomen is soft.     Tenderness: There is no abdominal tenderness. There is no guarding or rebound.  Musculoskeletal:     Comments: No C, T, or L midline spinal tenderness. No tenderness throughout back diffusely. No tenderness to all joints including shoulders, elbows, wrists, hips, knees, and ankles. Strength 5/5 to upper and lower extremities bilaterally. Sensation intact throughout. 2+ radial and DP pulses bilaterally.   Skin:    General: Skin is warm and dry.  Neurological:     Mental Status: She is alert.     Comments: CN 3-12 grossly intact A&O x4 GCS 15 Sensation and strength intact Gait nonataxic including with tandem walking Coordination with finger-to-nose WNL Neg romberg, neg pronator drift       ED Treatments / Results  Labs (all labs  ordered are listed, but only abnormal results are displayed) Labs Reviewed  URINALYSIS, ROUTINE W REFLEX MICROSCOPIC - Abnormal; Notable for the following components:      Result Value   Hgb urine dipstick SMALL (*)    All other components within normal limits  COMPREHENSIVE METABOLIC PANEL - Abnormal; Notable for the following components:   Chloride 97 (*)    Glucose, Bld 124 (*)    All other components within normal limits  CBG MONITORING, ED - Abnormal; Notable for the following components:   Glucose-Capillary 122 (*)    All other components within normal limits  CBC  TROPONIN I (HIGH SENSITIVITY)    EKG EKG Interpretation  Date/Time:  Sunday October 15 2018 12:46:02 EDT Ventricular Rate:  56 PR Interval:  274 QRS Duration: 104 QT Interval:  456 QTC Calculation: 440 R Axis:   90 Text Interpretation:  Sinus bradycardia with 1st degree A-V block Rightward axis Incomplete right bundle branch block Nonspecific ST and T wave abnormality Abnormal ECG Confirmed by Lacretia Leigh ([id number]) on 10/15/2018 3:38:35 PM   Radiology Dg Chest 2 View  Result Date: 10/15/2018 CLINICAL DATA:  Chest pain EXAM: CHEST - 2 VIEW COMPARISON:  May 15, 2017 FINDINGS: There is cardiomegaly. Both lungs are clear. The visualized skeletal structures are unremarkable. IMPRESSION: No active cardiopulmonary disease.  Cardiomegaly Electronically Signed   By: Prudencio Pair M.D.   On: 10/15/2018 15:39   Ct Head Wo Contrast  Result Date: 10/15/2018 CLINICAL DATA:  Syncope, right-sided scalp hematoma EXAM: CT HEAD WITHOUT CONTRAST CT CERVICAL SPINE WITHOUT CONTRAST TECHNIQUE: Multidetector CT imaging of the head and cervical spine was performed following the standard protocol without intravenous contrast. Multiplanar CT image reconstructions of the cervical spine were  also generated. COMPARISON:  None. FINDINGS: CT HEAD FINDINGS Brain: No evidence of acute infarction, hemorrhage, hydrocephalus, extra-axial  collection or mass lesion/mass effect. Vascular: No hyperdense vessel or unexpected calcification. Skull: Normal. Negative for fracture or focal lesion. Sinuses/Orbits: No acute finding. Other: None. CT CERVICAL SPINE FINDINGS Alignment: 3 mm anterolisthesis C2 on C3. Facet joints are aligned without dislocation. Skull base and vertebrae: No acute fracture. No primary bone lesion or focal pathologic process. Soft tissues and spinal canal: No prevertebral fluid or swelling. No visible canal hematoma. Disc levels: Multilevel intervertebral disc height loss with degenerative endplate spurring, most pronounced at C5-6 and C6-7. Multilevel mild facet and uncovertebral arthropathy. No evidence of high-grade canal stenosis. Upper chest: Negative. Other: None. IMPRESSION: 1. No acute intracranial findings. 2. No acute fracture or dislocation of the cervical spine. 3. Mild-moderate multilevel degenerative changes of the cervical spine. Electronically Signed   By: Davina Poke M.D.   On: 10/15/2018 15:31   Ct Cervical Spine Wo Contrast  Result Date: 10/15/2018 CLINICAL DATA:  Syncope, right-sided scalp hematoma EXAM: CT HEAD WITHOUT CONTRAST CT CERVICAL SPINE WITHOUT CONTRAST TECHNIQUE: Multidetector CT imaging of the head and cervical spine was performed following the standard protocol without intravenous contrast. Multiplanar CT image reconstructions of the cervical spine were also generated. COMPARISON:  None. FINDINGS: CT HEAD FINDINGS Brain: No evidence of acute infarction, hemorrhage, hydrocephalus, extra-axial collection or mass lesion/mass effect. Vascular: No hyperdense vessel or unexpected calcification. Skull: Normal. Negative for fracture or focal lesion. Sinuses/Orbits: No acute finding. Other: None. CT CERVICAL SPINE FINDINGS Alignment: 3 mm anterolisthesis C2 on C3. Facet joints are aligned without dislocation. Skull base and vertebrae: No acute fracture. No primary bone lesion or focal pathologic  process. Soft tissues and spinal canal: No prevertebral fluid or swelling. No visible canal hematoma. Disc levels: Multilevel intervertebral disc height loss with degenerative endplate spurring, most pronounced at C5-6 and C6-7. Multilevel mild facet and uncovertebral arthropathy. No evidence of high-grade canal stenosis. Upper chest: Negative. Other: None. IMPRESSION: 1. No acute intracranial findings. 2. No acute fracture or dislocation of the cervical spine. 3. Mild-moderate multilevel degenerative changes of the cervical spine. Electronically Signed   By: Davina Poke M.D.   On: 10/15/2018 15:31    Procedures Procedures (including critical care time)  Medications Ordered in ED Medications  sodium chloride flush (NS) 0.9 % injection 3 mL (has no administration in time range)     Initial Impression / Assessment and Plan / ED Course  I have reviewed the triage vital signs and the nursing notes.  Pertinent labs & imaging results that were available during my care of the patient were reviewed by me and considered in my medical decision making (see chart for details).    73 year old female who presents to the ED today for syncopal episode that occurred at 10:30 AM.  Patient did hit head.  She is currently anticoagulated on Eliquis.  Has no complaints at this time.  Does have a small hematoma to the right occiput but no signs of basilar skull fracture today.  Considering she is anticoagulated will obtain CT head and CT C-spine at this time.  Without any focal neuro deficits.  She is afebrile without tachycardia or tachypnea. No Meningeal signs.  Will obtain baseline blood work as well and check urinalysis.  Also complaining of some chest tightness with palpitations last night.  She does not have complaints at this time but given extensive cardiology history will obtain chest x-ray, EKG, troponin.  If all findings reassuring will discharge home.   EKG unchanged from previous. CXR clear. Labwork  reassuring - no leukocytosis. Hgb stable. No electrolyte abnormalities. Urine without infection. Troponin of 5. CT head and CT c spine clear.   Pt asymptomatic at this time and feels improved but given age feel she needs additional workup inpatient for her syncope. Attending physician Dr. Zenia Resides has evaluated patient as well and agrees with plan.   Discussed case with hospitalist Dr. Reesa Chew who suggests getting cardiology input on patient given negative workup today and recent ECHO in May 2019. Will consult cardiology at this time.   Discussed case with cardiology who also recommends admission at this time for syncope workup - recommend an Echo due to hx of pericardial effusion. Will reconsult hospitalist.   Dr. Linda Hedges with hospitalist agrees to accept patient for admission.   This note was prepared using Dragon voice recognition software and may include unintentional dictation errors due to the inherent limitations of voice recognition software.       Final Clinical Impressions(s) / ED Diagnoses   Final diagnoses:  Syncope and collapse    ED Discharge Orders    None       Eustaquio Maize, PA-C 10/15/18 1825    Lacretia Leigh, MD 10/18/18 [phone]

## 2018-10-15 NOTE — ED Notes (Signed)
Patient resting comfortably in bed, no complaints at this time. MD states okay for patient to eat/drink - dinner tray ordered and husband went to grab her snacks in the meantime.

## 2018-10-15 NOTE — ED Notes (Signed)
Pt ambulated to restroom with minimal assist. Denies any dizziness at this time.

## 2018-10-16 ENCOUNTER — Encounter (HOSPITAL_COMMUNITY): Payer: Self-pay

## 2018-10-16 ENCOUNTER — Observation Stay (HOSPITAL_BASED_OUTPATIENT_CLINIC_OR_DEPARTMENT_OTHER): Payer: Medicare Other

## 2018-10-16 DIAGNOSIS — I342 Nonrheumatic mitral (valve) stenosis: Secondary | ICD-10-CM | POA: Diagnosis not present

## 2018-10-16 DIAGNOSIS — R55 Syncope and collapse: Secondary | ICD-10-CM | POA: Diagnosis not present

## 2018-10-16 DIAGNOSIS — I080 Rheumatic disorders of both mitral and aortic valves: Secondary | ICD-10-CM | POA: Diagnosis not present

## 2018-10-16 DIAGNOSIS — I4819 Other persistent atrial fibrillation: Secondary | ICD-10-CM | POA: Diagnosis not present

## 2018-10-16 DIAGNOSIS — I10 Essential (primary) hypertension: Secondary | ICD-10-CM | POA: Diagnosis not present

## 2018-10-16 LAB — BASIC METABOLIC PANEL
Anion gap: 7 (ref 5–15)
BUN: 13 mg/dL (ref 8–23)
CO2: 29 mmol/L (ref 22–32)
Calcium: 8.9 mg/dL (ref 8.9–10.3)
Chloride: 100 mmol/L (ref 98–111)
Creatinine, Ser: 0.81 mg/dL (ref 0.44–1.00)
GFR calc Af Amer: >60 mL/min (ref >60)
GFR calc non Af Amer: >60 mL/min (ref >60)
Glucose, Bld: 112 mg/dL — ABNORMAL HIGH (ref 70–99)
Potassium: 3.4 mmol/L — ABNORMAL LOW (ref 3.5–5.1)
Sodium: 136 mmol/L (ref 135–145)

## 2018-10-16 LAB — ECHOCARDIOGRAM COMPLETE
Height: 60 in
Weight: 2490.32 oz

## 2018-10-16 LAB — SARS CORONAVIRUS 2 (TAT 6-24 HRS): SARS Coronavirus 2: NEGATIVE

## 2018-10-16 MED ORDER — LOSARTAN POTASSIUM 25 MG PO TABS
25.0000 mg | ORAL_TABLET | Freq: Every day | ORAL | Status: DC
  Administered 2018-10-17: 25 mg via ORAL
  Filled 2018-10-16: qty 1

## 2018-10-16 MED ORDER — SODIUM CHLORIDE 0.9% FLUSH
3.0000 mL | Freq: Two times a day (BID) | INTRAVENOUS | Status: DC
  Administered 2018-10-17 – 2018-10-20 (×4): 3 mL via INTRAVENOUS

## 2018-10-16 MED ORDER — HEPARIN (PORCINE) 25000 UT/250ML-% IV SOLN
750.0000 [IU]/h | INTRAVENOUS | Status: DC
  Administered 2018-10-18: 750 [IU]/h via INTRAVENOUS
  Filled 2018-10-16 (×2): qty 250

## 2018-10-16 MED ORDER — POTASSIUM CHLORIDE CRYS ER 20 MEQ PO TBCR
40.0000 meq | EXTENDED_RELEASE_TABLET | Freq: Once | ORAL | Status: AC
  Administered 2018-10-16: 40 meq via ORAL
  Filled 2018-10-16: qty 2

## 2018-10-16 NOTE — Progress Notes (Addendum)
Cardiology Consultation:   Patient ID: Mary Farrell MRN: [id number]; DOB: 09/13/1945  Admit date: 10/15/2018 Date of Consult: 10/16/2018  Primary Care Provider: Lawerance Cruel, MD Primary Cardiologist: Mary Latch, MD  Primary Electrophysiologist:  Will Meredith Leeds, MD    Patient Profile:   Mary Farrell is a 73 y.o. female with a hx of persistent afib/flutter on eliquis, moderate aortic regurg, mild-moderate MS, chronic pericardial effusion, and hypertension who is being seen today for the evaluation of syncope at the request of Mary Farrell.  History of Present Illness:   Mary Farrell follows with EP with Mary Farrell and Mary Farrell for the above cardiac problems. Patient saw cardiology  04/2015 due to shortness of breath that she felt was getting worse and was referred for an echo 04/2015 that revealed LVEF 55-60% with moderate AR and mild-moderate MS. There was also a moderate pericardial effusion but no evidence of tamponade. She had a heart cath 12/1994 that revealed normal coronaries with mild MS and mild pulmonary hypertension. She had a Cardiolite in 2007 that was negative for ischemia. She was seen in clinic 07/2015 and reported persistent shortness of breath with minimal exertion and chest discomfort. She was referred for exercise Myoview 08/26/15 that revealed LVEF 60% and no perfusion defects.   Mary Farrell was admitted 08/2015 with atrial flutter. She had an echo that showed a persistent moderate-severe pericardial effusion but no tamponade. She was started on amiodarone, which she did not tolerate due to nausea. She underwent DCCV on 09/22/15 and then was started on flecainide.She continued to report shortness of breath and was again referred for an echo that revealed LVEF 60-65% with grade 2 diastolic dysfunction, mild aortic regurgitation, and moderate mitral stenosis. She had a moderate pericardial effusion localized to the inferior and inferolateral walls. There was no  evidence of tamponade. Mary Farrell was seen in clinic 10/21/2016 for an acute visit due to atrial fibrillation. She had an outpatient DCCV on 10/1 and converted with one shock at 200J. She has had a total of 4 DCCV.She developed recurrent atrial fibrillation and was referred to EP. She underwent afib/flutter ablation with Mary Farrell 03/10/2017 and then repeat ablation on 06/24/17.  At her visit with Mary Farrell 05/01/18 patient notes she has been having dizzy spells lasting about 1 minute that occurred while sitting. She felt dizziness was worse with change in head movement, thought to be Vertigo.   Her last visit visit was with Mary Farrell 07/20/18 and was overall doing well. She had an episode of Afib but converted to NSR on her own. No swelling or orthopnea. No chest pain or sob. Losartan was decreased from 100 to 50 for dizzy/lightheaded episodes.   On 9/20 patient presented to the ER for syncope. Since May the patient has been having daily episodes of lightheadedness and flushing that come on when patient is sitting or standing. Episodes last 1 minute and the patient stops and waits for feeling to pass. The patient had been taking her BP after these episodes and sometimes were high and sometimes were low. More recently she notes BP have been low. She has never had chest pain or sob associated with these episodes. 2 nights ago she felt like she was in afib for palpations and sent a Kardia monitor strip to cardiology but rhythm was undetermined. The patient was told to take an extra Coreg which helped. She also took a Xanax that night and was able to sleep. The next morning the patient  was walking in her kitchen and felt lightheaded/flushed and the patient fainted. Her husband was there and was able to catch her before she hit the floor. Patient was unconscious for under 2 minutes. He felt the patient was very pale. After awakening the patient was a little confused but quickly regained consciousness.  In the  ED the patient was hemodynamically stable. BP 103/87, Pulse 59, afebrile, RR 16, O2 97%.  CT head was negative for acute process. Labs showed potassium 4.1, glucose 124, creatinine 0.88. WBC 6.2, Hgb 13.6. Hs troponin 5. COVID negative. UA negative for infection. EKG showed sinus brady with old first degree block, with nonspecific ST/T wave changes. Patient was admitted for further work-up.   Patient has lost 13 lbs with weight watchers in the last 6 months. She is staying hydrated. She is not very active, but does try to walk around her home daily. Denies alcohol/tobacco/drug use.   Since admission the patient has not had a repeat episode of lightheadedness/flushing.   Heart Pathway Score:     Past Medical History:  Diagnosis Date   Anxiety 06/29/2012   Aortic regurgitation 08/13/2015   Arthritis    "hands, wrists, back, feet, toes" (06/24/2017)   Atrial flutter (Mary Farrell) 09/18/2015   Atypical chest pain 05/13/2016   Chest pain    remote cath in 1996 with NORMAL coronaries noted   CHF (congestive heart failure) ([place])    Dyspnea 10/30/2010   Essential hypertension 09/05/2013   Exogenous obesity    history of    GERD (gastroesophageal reflux disease)    Glaucoma, both eyes    Headache, migraine    "stopped in my 26's" (09/18/2015)   History of cardiovascular stress test 2007   showing no ischemia   Hypertension    Mitral stenosis and aortic insufficiency 06/23/2010   Mitral stenosis with insufficiency    Murmur    of mitral stenosis and moderate aortic insufficiency       Nausea 05/01/2018   OSA on CPAP    Palpitations    occasional   Paroxysmal A-fib ([place]) 06/24/2017   Pericardial effusion 09/18/2015   Persistent atrial fibrillation 03/10/2017   Personal history of rheumatic heart disease    SBE (subacute bacterial endocarditis)    prophylaxis   Sliding hiatal hernia    Vertigo 05/01/2018    Past Surgical History:  Procedure Laterality Date   [place] N/A 03/10/2017   Procedure: ATRIAL FIBRILLATION ABLATION;  Surgeon: [name], MD;  Location: [facility] [facility];  Service: Cardiovascular;  Laterality: N/A;   ATRIAL FIBRILLATION ABLATION  06/24/2017   ATRIAL FIBRILLATION ABLATION N/A 06/24/2017   Procedure: ATRIAL FIBRILLATION ABLATION;  Surgeon: [name], MD;  Location: [facility];  Service: Cardiovascular;  Laterality: N/A;   BUNIONECTOMY Right 2000s   CARDIAC CATHETERIZATION  01/13/1995   normal coronary anatomy and mild mitral stenosis and mild pulmonary hypertension   CARDIOVERSION N/A 09/22/2015   Procedure: CARDIOVERSION;  Surgeon: Jerline Pain, MD;  Location: [facility] ENDOSCOPY;  Service: Cardiovascular;  Laterality: N/A;   CARDIOVERSION N/A 10/25/2016   Procedure: CARDIOVERSION;  Surgeon: Fay Records, MD;  Location: [facility];  Service: Cardiovascular;  Laterality: N/A;   CARDIOVERSION N/A 04/15/2017   Procedure: CARDIOVERSION;  Surgeon: Josue Hector, MD;  Location: [facility] ENDOSCOPY;  Service: Cardiovascular;  Laterality: N/A;   CARDIOVERSION N/A 05/16/2017   Procedure: CARDIOVERSION;  Surgeon: Debara Pickett,  [name], MD;  Location: [facility] ENDOSCOPY;  Service: Cardiovascular;  Laterality: N/A;   CATARACT EXTRACTION W/ INTRAOCULAR LENS  IMPLANT, BILATERAL Bilateral 2013   LAPAROSCOPIC CHOLECYSTECTOMY  1997   TEE WITHOUT CARDIOVERSION N/A 06/23/2017   Procedure: TRANSESOPHAGEAL ECHOCARDIOGRAM (TEE);  Surgeon: Fay Records, MD;  Location: [place];  Service: Cardiovascular;  Laterality: N/A;   TONSILLECTOMY AND ADENOIDECTOMY  1990s   UVULOPALATOPHARYNGOPLASTY, TONSILLECTOMY AND SEPTOPLASTY  1990s     Home Medications:  Prior to Admission medications   Medication Sig Start Date End Date Taking? Authorizing Provider  acetaminophen (TYLENOL) 500 MG tablet Take 500 mg by mouth every 6 (six) hours as needed for moderate pain or  headache.   Yes [provider]  ALPRAZolam (XANAX) 0.25 MG tablet Take 0.125 mg by mouth 2 (two) times daily as needed for anxiety.    Yes [provider]  carvedilol (COREG) 12.5 MG tablet Take 1 tablet (12.5 mg total) by mouth 2 (two) times daily with a meal. 08/01/18  Yes Camnitz, Will Hassell Done, MD  cetirizine (ZYRTEC) 10 MG tablet Take 10 mg by mouth at bedtime.    Yes [provider]  Cholecalciferol (VITAMIN D) 2000 UNITS CAPS Take 2,000 Units by mouth daily.     Yes [provider]  conjugated estrogens (PREMARIN) vaginal cream Place 1 Applicatorful vaginally daily as needed (irritation).    Yes [provider]  dorzolamide-timolol (COSOPT) 22.3-6.8 MG/ML ophthalmic solution Place 1 drop into both eyes daily.   Yes [provider]  doxepin (SINEQUAN) 10 MG capsule Take 10-20 mg by mouth at bedtime as needed (sleep).  06/10/17  Yes [provider]  ELIQUIS 5 MG TABS tablet TAKE 1 TABLET BY MOUTH TWO  TIMES DAILY Patient taking differently: Take 5 mg by mouth 2 (two) times daily.  06/20/18  Yes Mary Latch, MD  famotidine (PEPCID) 20 MG tablet Take 20 mg by mouth at bedtime.   Yes [provider]  flecainide (TAMBOCOR) 50 MG tablet TAKE 2 TABLETS BY MOUTH TWO TIMES DAILY Patient taking differently: Take 100 mg by mouth 2 (two) times daily.  02/21/18  Yes Baldwin Jamaica, PA-C  gabapentin (NEURONTIN) 300 MG capsule Take 300 mg by mouth 3 (three) times daily.    Yes [provider]  hydrochlorothiazide (MICROZIDE) 12.5 MG capsule TAKE 1 CAPSULE BY MOUTH  DAILY Patient taking differently: Take 12.5 mg by mouth daily.  03/21/18  Yes Mary Latch, MD  lactobacillus acidophilus (BACID) TABS tablet Take 1 tablet by mouth daily.    Yes [provider]  latanoprost (XALATAN) 0.005 % ophthalmic solution Place 1 drop into both eyes at bedtime.  06/17/14  Yes [provider]  losartan (COZAAR) 50 MG  tablet Take 50 mg by mouth daily.   Yes [provider]  Multiple Vitamin (MULTIVITAMIN WITH MINERALS) TABS tablet Take 1 tablet by mouth daily.   Yes [provider]  omeprazole (PRILOSEC) 40 MG capsule Take 40 mg by mouth daily.    Yes [provider]  oxybutynin (DITROPAN-XL) 10 MG 24 hr tablet Take 10 mg by mouth daily.  12/16/15  Yes [provider]  polyethylene glycol (MIRALAX / GLYCOLAX) 17 g packet Take 17 g by mouth See admin instructions. 3-4 times a week   Yes [provider]  PRESCRIPTION MEDICATION Inhale into the lungs at bedtime. CPAP   Yes [provider]  NON FORMULARY Apply 1 application topically 2 (two) times daily as needed (for  eczema). Traimcinolone/[facility] Moist Cream    [provider]    Inpatient Medications: Scheduled Meds:  apixaban  5 mg Oral BID   carvedilol  12.5 mg Oral BID WC   famotidine  20 mg Oral QHS   flecainide  100 mg Oral BID   gabapentin  300 mg Oral TID   hydrochlorothiazide  12.5 mg Oral Daily   loratadine  10 mg Oral Daily   losartan  50 mg Oral Daily   oxybutynin  10 mg Oral Daily   pantoprazole  40 mg Oral Daily   sodium chloride flush  3 mL Intravenous Once   sodium chloride flush  3 mL Intravenous Q12H   Continuous Infusions:  sodium chloride     metronidazole Stopped (10/16/18 1042)   PRN Meds: sodium chloride, acetaminophen, ALPRAZolam, doxepin, polyethylene glycol, sodium chloride flush, traMADol  Allergies:    Allergies  Allergen Reactions   Amoxicillin Diarrhea    Has patient had a PCN reaction causing immediate rash, facial/tongue/throat swelling, SOB or lightheadedness with hypotension: no Has patient had a PCN reaction causing severe rash involving mucus membranes or skin necrosis: no Has patient had a PCN reaction that required hospitalization: no Has patient had a PCN reaction occurring within the last 10 years: no If all of the above answers  are "NO", then may proceed with Cephalosporin use.    Metoprolol Other (See Comments)    Mouth tastes like mold   Metoprolol Tartrate     Other reaction(s): Other (See Comments) Mouth sores   Oxaprozin Nausea Only    Social History:   Social History   Socioeconomic History   Marital status: Married    Spouse name: Not on file   Number of children: Not on file   Years of education: Not on file   Highest education level: Not on file  Occupational History   Occupation: Retired  Scientist, product/process development strain: Not on file   Food insecurity    Worry: Not on file    Inability: Not on Lexicographer needs    Medical: Not on file    Non-medical: Not on file  Tobacco Use   Smoking status: Never Smoker   Smokeless tobacco: Never Used  Substance and Sexual Activity   Alcohol use: No   Drug use: No   Sexual activity: Not Currently  Lifestyle   Physical activity    Days per week: Not on file    Minutes per session: Not on file   Stress: Not on file  Relationships   Social connections    Talks on phone: Not on file    Gets together: Not on file    Attends religious service: Not on file    Active member of club or organization: Not on file    Attends meetings of clubs or organizations: Not on file    Relationship status: Not on file   Intimate partner violence    Fear of current or ex partner: Not on file    Emotionally abused: Not on file    Physically abused: Not on file    Forced sexual activity: Not on file  Other Topics Concern   Not on file  Social History Narrative   Lives with husband in [place].    Family History:   Family History  Problem Relation Age of Onset   Heart attack Mother    Hypertension Brother    Kidney disease Brother    Diabetes Brother  Heart failure Maternal Grandfather      ROS:  Please see the history of present illness.  All other ROS reviewed and negative.     Physical Exam/Data:    Vitals:   10/16/18 1017 10/16/18 1019 10/16/18 1020 10/16/18 1023  BP:      Pulse: (!) 58 61 61 60  Resp:      Temp:      TempSrc:      SpO2:      Weight:      Height:       No intake or output data in the 24 hours ending 10/16/18 1240 Last 3 Weights 10/16/2018 10/15/2018 07/20/2018  Weight (lbs) 155 lb 10.3 oz 154 lb 6.4 oz 157 lb  Weight (kg) 70.6 kg 70.035 kg 71.215 kg     Body mass index is 30.4 kg/m.  General:  Well nourished, well developed, in no acute distress HEENT: normal Lymph: no adenopathy Neck: no JVD Endocrine:  No thryomegaly Vascular: No carotid bruits; FA pulses 2+ bilaterally without bruits  Cardiac:  normal S1, S2; RRR; diastolic murmur  Lungs:  clear to auscultation bilaterally, no wheezing, rhonchi or rales  Abd: soft, nontender, no hepatomegaly  Ext: no edema Musculoskeletal:  No deformities, BUE and BLE strength normal and equal Skin: warm and dry  Neuro:  CNs 2-12 intact, no focal abnormalities noted Psych:  Normal affect   EKG:  The EKG was personally reviewed and demonstrates:  Sinus bradycardia, 56 bpm, 1st degree AV block, (PRI 274 ms), possible incomplete RBBB  (QRS 104 ms) Telemetry:  Telemetry was personally reviewed and demonstrates:  NSR, HR upper 50 to 60s; first degree AV block (PRI 0.27 ms)  Relevant CV Studies:  Echo 10/16/18 IMPRESSIONS  1. Left ventricular ejection fraction, by visual estimation, is 60 to 65%. The left ventricle has normal function. Normal left ventricular size. There is no left ventricular hypertrophy.  2. Global right ventricle has normal systolic function.The right ventricular size is normal. No increase in right ventricular wall thickness.  3. Left atrial size was severely dilated.  4. Right atrial size was normal.  5. Small pericardial effusion.  6. Mild calcification of the posterior mitral valve leaflet(s).  7. Moderate thickening of the posterior mitral valve leaflet(s).  8. Moderately decreased mobility  of the mitral valve leaflets.  9. The mitral valve is rheumatic. Mild mitral valve regurgitation. Moderate-severe mitral stenosis. 10. Mean transmitral gradient is 39mmHg.     Viewed personally TEE from 2019 and value leaflets appear thin and non calcified. Harmonics on current echocardiogram may be causing artifactual thickening. 11. The tricuspid valve is normal in structure. Tricuspid valve regurgitation was not visualized by color flow Doppler. 12. The aortic valve is normal in structure. Aortic valve regurgitation is mild by color flow Doppler. Mild aortic valve sclerosis without stenosis. 13. The pulmonic valve was normal in structure. Pulmonic valve regurgitation is trivial by color flow Doppler. 14. Mildly elevated pulmonary artery systolic pressure. 15. The inferior vena cava is normal in size with greater than 50% respiratory variability, suggesting right atrial pressure of 3 mmHg. 16. Rheumatic mitral valve stenosis. Transmitral gradient has increased. Consider repeat TEE and stress echo to evaluate transmitral gradient during exercise. May need a candidate for balloon valvuloplasty.  Echo TEE 05/2017 LV size is normal LV systolic function appears at least mildly depressed. AV is mildly thickened. Mild eccentric AI. Mild MR. MV is thickened with restricted motion MVA by P T 1/2 is 1.6 cm2.  There is a small ASD in the mid atrial septum with continuous L to R flow. May represent an iatrogenic sequelae from prior ablation.  Laboratory Data:  High Sensitivity Troponin:   Recent Labs  Lab 10/15/18 1625  TROPONINIHS 5     Chemistry Recent Labs  Lab 10/15/18 1259 10/16/18 0233  NA 135 136  K 4.1 3.4*  CL 97* 100  CO2 31 29  GLUCOSE 124* 112*  BUN 14 13  CREATININE 0.88 0.81  CALCIUM 9.5 8.9  GFRNONAA >60 >60  GFRAA >60 >60  ANIONGAP 7 7    Recent Labs  Lab 10/15/18 1259  PROT 6.6  ALBUMIN 3.8  AST 21  ALT 19  ALKPHOS 60  BILITOT 1.0   Hematology Recent Labs   Lab 10/15/18 1259  WBC 6.2  RBC 4.28  HGB 13.6  HCT 42.2  MCV 98.6  MCH 31.8  MCHC 32.2  RDW 13.4  PLT 206   BNPNo results for input(s): BNP, PROBNP in the last 168 hours.  DDimer No results for input(s): DDIMER in the last 168 hours.   Radiology/Studies:  Dg Chest 2 View  Result Date: 10/15/2018 CLINICAL DATA:  Chest pain EXAM: CHEST - 2 VIEW COMPARISON:  May 15, 2017 FINDINGS: There is cardiomegaly. Both lungs are clear. The visualized skeletal structures are unremarkable. IMPRESSION: No active cardiopulmonary disease.  Cardiomegaly Electronically Signed   By: Prudencio Pair M.D.   On: 10/15/2018 15:39   Ct Head Wo Contrast  Result Date: 10/15/2018 CLINICAL DATA:  Syncope, right-sided scalp hematoma EXAM: CT HEAD WITHOUT CONTRAST CT CERVICAL SPINE WITHOUT CONTRAST TECHNIQUE: Multidetector CT imaging of the head and cervical spine was performed following the standard protocol without intravenous contrast. Multiplanar CT image reconstructions of the cervical spine were also generated. COMPARISON:  None. FINDINGS: CT HEAD FINDINGS Brain: No evidence of acute infarction, hemorrhage, hydrocephalus, extra-axial collection or mass lesion/mass effect. Vascular: No hyperdense vessel or unexpected calcification. Skull: Normal. Negative for fracture or focal lesion. Sinuses/Orbits: No acute finding. Other: None. CT CERVICAL SPINE FINDINGS Alignment: 3 mm anterolisthesis C2 on C3. Facet joints are aligned without dislocation. Skull base and vertebrae: No acute fracture. No primary bone lesion or focal pathologic process. Soft tissues and spinal canal: No prevertebral fluid or swelling. No visible canal hematoma. Disc levels: Multilevel intervertebral disc height loss with degenerative endplate spurring, most pronounced at C5-6 and C6-7. Multilevel mild facet and uncovertebral arthropathy. No evidence of high-grade canal stenosis. Upper chest: Negative. Other: None. IMPRESSION: 1. No acute intracranial  findings. 2. No acute fracture or dislocation of the cervical spine. 3. Mild-moderate multilevel degenerative changes of the cervical spine. Electronically Signed   By: Davina Poke M.D.   On: 10/15/2018 15:31   Ct Cervical Spine Wo Contrast  Result Date: 10/15/2018 CLINICAL DATA:  Syncope, right-sided scalp hematoma EXAM: CT HEAD WITHOUT CONTRAST CT CERVICAL SPINE WITHOUT CONTRAST TECHNIQUE: Multidetector CT imaging of the head and cervical spine was performed following the standard protocol without intravenous contrast. Multiplanar CT image reconstructions of the cervical spine were also generated. COMPARISON:  None. FINDINGS: CT HEAD FINDINGS Brain: No evidence of acute infarction, hemorrhage, hydrocephalus, extra-axial collection or mass lesion/mass effect. Vascular: No hyperdense vessel or unexpected calcification. Skull: Normal. Negative for fracture or focal lesion. Sinuses/Orbits: No acute finding. Other: None. CT CERVICAL SPINE FINDINGS Alignment: 3 mm anterolisthesis C2 on C3. Facet joints are aligned without dislocation. Skull base and vertebrae: No acute fracture. No primary bone lesion or focal pathologic  process. Soft tissues and spinal canal: No prevertebral fluid or swelling. No visible canal hematoma. Disc levels: Multilevel intervertebral disc height loss with degenerative endplate spurring, most pronounced at C5-6 and C6-7. Multilevel mild facet and uncovertebral arthropathy. No evidence of high-grade canal stenosis. Upper chest: Negative. Other: None. IMPRESSION: 1. No acute intracranial findings. 2. No acute fracture or dislocation of the cervical spine. 3. Mild-moderate multilevel degenerative changes of the cervical spine. Electronically Signed   By: Davina Poke M.D.   On: 10/15/2018 15:31   Vas US Carotid  Result Date: 10/16/2018 Carotid Arterial Duplex Study Indications:       Syncope. Risk Factors:      Hypertension. Comparison Study:  No prior study. Performing  Technologist: Maudry Mayhew MHA, RDMS, RVT, RDCS  Examination Guidelines: A complete evaluation includes B-mode imaging, spectral Doppler, color Doppler, and power Doppler as needed of all accessible portions of each vessel. Bilateral testing is considered an integral part of a complete examination. Limited examinations for reoccurring indications may be performed as noted.  Right Carotid Findings: +----------+--------+--------+--------+--------------------------+--------+             PSV cm/s EDV cm/s Stenosis Plaque Description         Comments  +----------+--------+--------+--------+--------------------------+--------+  CCA Prox   56       12                smooth and heterogenous              +----------+--------+--------+--------+--------------------------+--------+  CCA Distal 73       17                heterogenous and smooth              +----------+--------+--------+--------+--------------------------+--------+  ICA Prox   75       14                                                     +----------+--------+--------+--------+--------------------------+--------+  ICA Distal 185      17                                                     +----------+--------+--------+--------+--------------------------+--------+  ECA        67       6                 irregular and heterogenous           +----------+--------+--------+--------+--------------------------+--------+ +----------+--------+-------+----------------+-------------------+             PSV cm/s EDV cms Describe         Arm Pressure (mmHG)  +----------+--------+-------+----------------+-------------------+  Subclavian 139              Multiphasic, WNL                      +----------+--------+-------+----------------+-------------------+ +---------+--------+--+--------+-+---------+  Vertebral PSV cm/s 20 EDV cm/s 5 Antegrade  +---------+--------+--+--------+-+---------+  Left Carotid Findings:  +----------+--------+--------+--------+-----------------------+--------+             PSV cm/s EDV cm/s Stenosis Plaque Description      Comments  +----------+--------+--------+--------+-----------------------+--------+  CCA Prox   102  16                                                  +----------+--------+--------+--------+-----------------------+--------+  CCA Distal 100      16                                                  +----------+--------+--------+--------+-----------------------+--------+  ICA Prox   72       11                smooth and heterogenous           +----------+--------+--------+--------+-----------------------+--------+  ICA Distal 126      26                                                  +----------+--------+--------+--------+-----------------------+--------+  ECA        89       4                                                   +----------+--------+--------+--------+-----------------------+--------+ +----------+--------+--------+----------------+-------------------+             PSV cm/s EDV cm/s Describe         Arm Pressure (mmHG)  +----------+--------+--------+----------------+-------------------+  Subclavian 118               Multiphasic, WNL                      +----------+--------+--------+----------------+-------------------+ +---------+--------+--+--------+-+---------+  Vertebral PSV cm/s 56 EDV cm/s 9 Antegrade  +---------+--------+--+--------+-+---------+  Summary: Right Carotid: Velocities in the right ICA are consistent with a 1-39% stenosis. Left Carotid: Velocities in the left ICA are consistent with a 1-39% stenosis. Vertebrals:  Bilateral vertebral arteries demonstrate antegrade flow. Subclavians: Normal flow hemodynamics were seen in bilateral subclavian              arteries. *See table(s) above for measurements and observations.     Preliminary     Assessment and Plan:   Pre-syncope/Syncope Patient with intermittent episodes of lightheadedness/flushing for  several months that occur sitting or standing. Reports hypotension with syncopal episode 2 nights ago. CT head unremarkable. EKG with sinus bradycardia with first degree block (old).  - Unsure etiology for syncope  - Patient denies chest pain or sob during episodes. Does say she feels heart beat in her head. No nausea, vomiting, or diaphoresis.  - Home meds include Coreg 12.5 mg BID, Cozaar 50 mg QD, HCTZ 12.5 mg daily - Orthostatics were 119/75, 135/65, 139/58. HR was stable.  - Tele shows NSR rates in the 60s - Echo this admission shows EF 60-65%, LA and RA dilated, small pericardial effusion, mod-severe stenosis, mean gradient 42mmHg.  - Patient has a Investment banker, operational monitor at home (app on patients phone) but this has not been tracing during episodes of lightheadedness - Consider 30 day heart monitor at discharge - Will plan to  get a stress echo to further evaluate MS. Consult to Structural team.  HTN - Home meds coreg 12.5 mg BID, cozaar 50 mg QD, HCTZ 12.5 mg daily - Losartan recently decreased for hypotension/dizziness episodes which seemed to have helped at first. - Can consider backing off on Losartan  Persistent Afib S/P ablation x 2 - Home meds Eliquis and Flecanide - Patient called her doctor 2 days ago for feeling jittery thinking she was in afib. Patient sent a cardiac strip from [facility] monitor from which it said rhythym was undetermined. BP was 138/85, pulse 105. She was told to take an extra coreg which helped the patient.   - CHADSVASC = 3  - Rates 60-70s, stable in NSR  Chronic Pericardial effusion - Repeat Echo showed small pericardial effusion - Denies chest pain  Rheumatic Moderate-severe mitral stenosis - per echo mod-severe stenosis, mean gradient 26mmHg.  - Plan for stress echo to evaluate gradient - Consult Structural Heart team - Patient does endorse some sob, but is not very active at baseline  Hyperglycemia - AM glucose 112 - per IM  Hypokalemia - Repleted -  Recheck  For questions or updates, please contact [place] Please consult [email or web address] for contact info under     Signed, Vearl Aitken Ninfa Meeker, PA-C  10/16/2018 12:40 PM

## 2018-10-16 NOTE — Progress Notes (Signed)
Carotid artery duplex completed. Refer to "CV Proc" under chart review to view preliminary results.  10/16/2018 10:15 AM Maudry Mayhew, MHA, RVT, RDCS, RDMS

## 2018-10-16 NOTE — Progress Notes (Addendum)
[facility] PROGRESS NOTE  Mary Farrell [id number] DOB: 06/09/1945 DOA: 10/15/2018 PCP: Lawerance Cruel, MD  Assessment/Plan:  #1.  Syncope.  Patient with intermittent episodes of lightheadedness/flushing over several weeks.  Reports hypotension had syncopal event last night.  Etiology unclear.  CT of the head no acute intracranial findings.  No sign symptoms of infection.  No metabolic derangements. EKG with Sinus bradycardia with 1st degree A-V block Rightward axis Incomplete right bundle branch block Nonspecific ST and T wave abnormality. BP high end of normal. Concern for cardiogenic syncope vs vaso-vagal. Home meds include coreg, cozaar, , HCTZ  -obtain echo -obtain carotid doppler -orthostatic vitals -continue home meds for now -cardiology consult requested -Defer any medication adjustment to cardiology  #2.  Hypertension.  Home meds as noted above.  Of note patient reports losartan recently decreased -Continue home meds -Monitor closely  #3.  Persistent A. fib.  EKG as noted above.  No chest pain.  Home medications include Eliquis and flecainide.  Mali Vascor 3.  Status post ablation and she has Chad monitor.  Chart review indicates she called on-call cardiology 2 days ago with complaints of feeling jittery and anxious and symptoms resembled her prior atrial fibrillation.  No chest pain shortness of breath.  At that time she checked her vitals blood pressure 138/85 and pulse was 105.  Telephone note states  kadia monitor showed undetermined rhythm.  At that time she was advised to take an extra Coreg and recheck her vitals.  The next day she followed up stating she felt better and her heart rate had improved to the 60s. -Continue home meds -See above  #4.  Chronic pericardial effusion.  Stable.  #5.  Hypertension.  Blood pressure running on the high end of normal.  Home medications include Cozaar, HCTZ, Coreg -Continue home meds -Check orthostatics -Monitor  closely -defer medication adjustment to cardiology  #6.  Anxiety.  Appears stable at baseline  #7.  Hyperglycemia.  Serum glucose 124 on admission.  No history of diabetes.  Serum glucose this morning 112. -Monitor  #8.  Hypokalemia.  Mild.  Medications do include HCTZ. -Replete -Recheck in am  Code Status: full Family Communication: patient Disposition Plan: home when ready   Consultants:  cardmaster  Procedures:  echo  Antibiotics:  Flagyl  cipro  HPI/Subjective: Awake alert sitting in chair.  Denies any pain/discomfort  Objective: Vitals:   10/16/18 0657 10/16/18 0752  BP:  (!) 155/69  Pulse:  66  Resp:  18  Temp: 97.7 F (36.5 C) 98 F (36.7 C)  SpO2:  100%   No intake or output data in the 24 hours ending 10/16/18 0856 Filed Weights   10/15/18 1249 10/16/18 0752  Weight: 70 kg 70.6 kg    Exam:   General: Well-nourished alert no acute distress  Cardiovascular: Regular rate and rhythm no murmur gallop or rub trace lower extremity edema pedal pulses present palpable  Respiratory: No increased work of breathing breath sounds somewhat distant but clear no wheezes no crackles  Abdomen: Obese positive bowel sounds throughout no guarding or rebounding with palpitation  Musculoskeletal: Joints without swelling/erythema  Data Reviewed: Basic Metabolic Panel: Recent Labs  Lab 10/15/18 1259 10/16/18 0233  NA 135 136  K 4.1 3.4*  CL 97* 100  CO2 31 29  GLUCOSE 124* 112*  BUN 14 13  CREATININE 0.88 0.81  CALCIUM 9.5 8.9   Liver Function Tests: Recent Labs  Lab 10/15/18 1259  AST 21  ALT  19  ALKPHOS 60  BILITOT 1.0  PROT 6.6  ALBUMIN 3.8   No results for input(s): LIPASE, AMYLASE in the last 168 hours. No results for input(s): AMMONIA in the last 168 hours. CBC: Recent Labs  Lab 10/15/18 1259  WBC 6.2  HGB 13.6  HCT 42.2  MCV 98.6  PLT 206   Cardiac Enzymes: No results for input(s): CKTOTAL, CKMB, CKMBINDEX, TROPONINI in the  last 168 hours. BNP (last 3 results) No results for input(s): BNP in the last 8760 hours.  ProBNP (last 3 results) No results for input(s): PROBNP in the last 8760 hours.  CBG: Recent Labs  Lab 10/15/18 1257  GLUCAP 122*    Recent Results (from the past 240 hour(s))  SARS CORONAVIRUS 2 (TAT 6-24 HRS) Nasopharyngeal Nasopharyngeal Swab     Status: None   Collection Time: 10/15/18  6:25 PM   Specimen: Nasopharyngeal Swab  Result Value Ref Range Status   SARS Coronavirus 2 NEGATIVE NEGATIVE Final    Comment: (NOTE) SARS-CoV-2 target nucleic acids are NOT DETECTED. The SARS-CoV-2 RNA is generally detectable in upper and lower respiratory specimens during the acute phase of infection. Negative results do not preclude SARS-CoV-2 infection, do not rule out co-infections with other pathogens, and should not be used as the sole basis for treatment or other patient management decisions. Negative results must be combined with clinical observations, patient history, and epidemiological information. The expected result is Negative. Fact Sheet for Patients: SugarRoll.be Fact Sheet for Healthcare Providers: https://www.woods-mathews.com/ This test is not yet approved or cleared by the [place] FDA and  has been authorized for detection and/or diagnosis of SARS-CoV-2 by FDA under an Emergency Use Authorization (EUA). This EUA will remain  in effect (meaning this test can be used) for the duration of the COVID-19 declaration under Section 56 4(b)(1) of the Act, 21 U.S.C. section 360bbb-3(b)(1), unless the authorization is terminated or revoked sooner. Performed at [facility], [place] [address] [place], [place] [address]      Studies: Dg Chest 2 View  Result Date: 10/15/2018 CLINICAL DATA:  Chest pain EXAM: CHEST - 2 VIEW COMPARISON:  May 15, 2017 FINDINGS: There is cardiomegaly. Both lungs are clear. The visualized skeletal  structures are unremarkable. IMPRESSION: No active cardiopulmonary disease.  Cardiomegaly Electronically Signed   By: Prudencio Pair M.D.   On: 10/15/2018 15:39   Ct Head Wo Contrast  Result Date: 10/15/2018 CLINICAL DATA:  Syncope, right-sided scalp hematoma EXAM: CT HEAD WITHOUT CONTRAST CT CERVICAL SPINE WITHOUT CONTRAST TECHNIQUE: Multidetector CT imaging of the head and cervical spine was performed following the standard protocol without intravenous contrast. Multiplanar CT image reconstructions of the cervical spine were also generated. COMPARISON:  None. FINDINGS: CT HEAD FINDINGS Brain: No evidence of acute infarction, hemorrhage, hydrocephalus, extra-axial collection or mass lesion/mass effect. Vascular: No hyperdense vessel or unexpected calcification. Skull: Normal. Negative for fracture or focal lesion. Sinuses/Orbits: No acute finding. Other: None. CT CERVICAL SPINE FINDINGS Alignment: 3 mm anterolisthesis C2 on C3. Facet joints are aligned without dislocation. Skull base and vertebrae: No acute fracture. No primary bone lesion or focal pathologic process. Soft tissues and spinal canal: No prevertebral fluid or swelling. No visible canal hematoma. Disc levels: Multilevel intervertebral disc height loss with degenerative endplate spurring, most pronounced at C5-6 and C6-7. Multilevel mild facet and uncovertebral arthropathy. No evidence of high-grade canal stenosis. Upper chest: Negative. Other: None. IMPRESSION: 1. No acute intracranial findings. 2. No acute fracture or dislocation of  the cervical spine. 3. Mild-moderate multilevel degenerative changes of the cervical spine. Electronically Signed   By: Davina Poke M.D.   On: 10/15/2018 15:31   Ct Cervical Spine Wo Contrast  Result Date: 10/15/2018 CLINICAL DATA:  Syncope, right-sided scalp hematoma EXAM: CT HEAD WITHOUT CONTRAST CT CERVICAL SPINE WITHOUT CONTRAST TECHNIQUE: Multidetector CT imaging of the head and cervical spine was performed  following the standard protocol without intravenous contrast. Multiplanar CT image reconstructions of the cervical spine were also generated. COMPARISON:  None. FINDINGS: CT HEAD FINDINGS Brain: No evidence of acute infarction, hemorrhage, hydrocephalus, extra-axial collection or mass lesion/mass effect. Vascular: No hyperdense vessel or unexpected calcification. Skull: Normal. Negative for fracture or focal lesion. Sinuses/Orbits: No acute finding. Other: None. CT CERVICAL SPINE FINDINGS Alignment: 3 mm anterolisthesis C2 on C3. Facet joints are aligned without dislocation. Skull base and vertebrae: No acute fracture. No primary bone lesion or focal pathologic process. Soft tissues and spinal canal: No prevertebral fluid or swelling. No visible canal hematoma. Disc levels: Multilevel intervertebral disc height loss with degenerative endplate spurring, most pronounced at C5-6 and C6-7. Multilevel mild facet and uncovertebral arthropathy. No evidence of high-grade canal stenosis. Upper chest: Negative. Other: None. IMPRESSION: 1. No acute intracranial findings. 2. No acute fracture or dislocation of the cervical spine. 3. Mild-moderate multilevel degenerative changes of the cervical spine. Electronically Signed   By: Davina Poke M.D.   On: 10/15/2018 15:31    Scheduled Meds: . apixaban  5 mg Oral BID  . carvedilol  12.5 mg Oral BID WC  . famotidine  20 mg Oral QHS  . flecainide  100 mg Oral BID  . gabapentin  300 mg Oral TID  . hydrochlorothiazide  12.5 mg Oral Daily  . loratadine  10 mg Oral Daily  . losartan  50 mg Oral Daily  . oxybutynin  10 mg Oral Daily  . pantoprazole  40 mg Oral Daily  . sodium chloride flush  3 mL Intravenous Once  . sodium chloride flush  3 mL Intravenous Q12H   Continuous Infusions: . sodium chloride    . ciprofloxacin    . metronidazole      Principal Problem:   Syncope Active Problems:   Persistent atrial fibrillation   Essential hypertension   Mitral  stenosis and aortic insufficiency   GERD (gastroesophageal reflux disease)   Anxiety   Pericardial effusion    Time spent: 69 minutes    [place] NP  [facility]  If 7PM-7AM, please contact night-coverage at [email or web address], password [facility] 10/16/2018, 8:56 AM  LOS: 0 days

## 2018-10-16 NOTE — Progress Notes (Signed)
ANTICOAGULATION CONSULT NOTE - Initial Consult  Pharmacy Consult for apixaban>>heparin Indication: atrial fibrillation  Allergies  Allergen Reactions  . Amoxicillin Diarrhea    Has patient had a PCN reaction causing immediate rash, facial/tongue/throat swelling, SOB or lightheadedness with hypotension: no Has patient had a PCN reaction causing severe rash involving mucus membranes or skin necrosis: no Has patient had a PCN reaction that required hospitalization: no Has patient had a PCN reaction occurring within the last 10 years: no If all of the above answers are "NO", then may proceed with Cephalosporin use.   . Metoprolol Other (See Comments)    Mouth tastes like mold  . Metoprolol Tartrate     Other reaction(s): Other (See Comments) Mouth sores  . Oxaprozin Nausea Only    Patient Measurements: Height: 5' (152.4 cm) Weight: 155 lb 10.3 oz (70.6 kg) IBW/kg (Calculated) : 45.5 Heparin Dosing Weight: 60kg  Vital Signs: Temp: 98 F (36.7 C) (09/21 0752) Temp Source: Oral (09/21 0752) BP: 157/74 (09/21 1725) Pulse Rate: 67 (09/21 1725)  Labs: Recent Labs    10/15/18 1259 10/15/18 1625 10/16/18 0233  HGB 13.6  --   --   HCT 42.2  --   --   PLT 206  --   --   CREATININE 0.88  --  0.81  TROPONINIHS  --  5  --     Estimated Creatinine Clearance: 53.4 mL/min (by C-G formula based on SCr of 0.81 mg/dL).   Medical History: Past Medical History:  Diagnosis Date  . Anxiety 06/29/2012  . Aortic regurgitation 08/13/2015  . Arthritis    "hands, wrists, back, feet, toes" (06/24/2017)  . Atrial flutter ([place]) 09/18/2015  . Atypical chest pain 05/13/2016  . Chest pain    remote cath in 1996 with NORMAL coronaries noted  . CHF (congestive heart failure) ([place])   . Dyspnea 10/30/2010  . Essential hypertension 09/05/2013  . Exogenous obesity    history of   . GERD (gastroesophageal reflux disease)   . Glaucoma, both eyes   . Headache, migraine    "stopped in my 50's"  (09/18/2015)  . History of cardiovascular stress test 2007   showing no ischemia  . Hypertension   . Mitral stenosis and aortic insufficiency 06/23/2010  . Mitral stenosis with insufficiency   . Murmur    of mitral stenosis and moderate aortic insufficiency      . Nausea 05/01/2018  . OSA on CPAP   . Palpitations    occasional  . Paroxysmal A-fib ([place]) 06/24/2017  . Pericardial effusion 09/18/2015  . Persistent atrial fibrillation 03/10/2017  . Personal history of rheumatic heart disease   . SBE (subacute bacterial endocarditis)    prophylaxis  . Sliding hiatal hernia   . Vertigo 05/01/2018   Assessment: 73 year old female with afib, possibly valvular in nature now. Structural heart team to assess. Apixaban given this am, will transition heparin in case procedures are needed, plan to eventually transition to warfarin.   Goal of Therapy:  Heparin level 0.3-0.7 units/ml aPTT 66-102s seconds Monitor platelets by anticoagulation protocol: Yes   Plan:  Start heparin infusion at 1000 units/hr>>tonight at 2200 Check anti-Xa level in 8 hours and daily while on heparin Continue to monitor H&H and platelets  Erin Hearing PharmD., BCPS Clinical Pharmacist 10/16/2018 5:58 PM

## 2018-10-16 NOTE — ED Notes (Signed)
Tele

## 2018-10-16 NOTE — Progress Notes (Signed)
  Echocardiogram 2D Echocardiogram has been performed.  Johny Chess 10/16/2018, 9:59 AM

## 2018-10-16 NOTE — ED Notes (Signed)
Breakfast Ordered 

## 2018-10-16 NOTE — Progress Notes (Signed)
Report given to [place] on [facility].

## 2018-10-16 NOTE — Telephone Encounter (Signed)
Pt admitted over weekend with syncopal episode.

## 2018-10-17 ENCOUNTER — Inpatient Hospital Stay (HOSPITAL_COMMUNITY): Payer: Medicare Other

## 2018-10-17 ENCOUNTER — Encounter (HOSPITAL_COMMUNITY)
Admission: EM | Disposition: A | Discharge status: Home / Self Care | Payer: Self-pay | Source: Home / Self Care | Attending: Internal Medicine

## 2018-10-17 ENCOUNTER — Encounter (HOSPITAL_COMMUNITY): Payer: Self-pay

## 2018-10-17 ENCOUNTER — Observation Stay (HOSPITAL_COMMUNITY): Payer: Medicare Other

## 2018-10-17 DIAGNOSIS — I4819 Other persistent atrial fibrillation: Secondary | ICD-10-CM | POA: Diagnosis present

## 2018-10-17 DIAGNOSIS — I340 Nonrheumatic mitral (valve) insufficiency: Secondary | ICD-10-CM

## 2018-10-17 DIAGNOSIS — Z880 Allergy status to penicillin: Secondary | ICD-10-CM | POA: Diagnosis not present

## 2018-10-17 DIAGNOSIS — Z841 Family history of disorders of kidney and ureter: Secondary | ICD-10-CM | POA: Diagnosis not present

## 2018-10-17 DIAGNOSIS — Z7901 Long term (current) use of anticoagulants: Secondary | ICD-10-CM | POA: Diagnosis not present

## 2018-10-17 DIAGNOSIS — I342 Nonrheumatic mitral (valve) stenosis: Secondary | ICD-10-CM | POA: Diagnosis not present

## 2018-10-17 DIAGNOSIS — E876 Hypokalemia: Secondary | ICD-10-CM | POA: Diagnosis present

## 2018-10-17 DIAGNOSIS — I10 Essential (primary) hypertension: Secondary | ICD-10-CM | POA: Diagnosis not present

## 2018-10-17 DIAGNOSIS — K219 Gastro-esophageal reflux disease without esophagitis: Secondary | ICD-10-CM | POA: Diagnosis present

## 2018-10-17 DIAGNOSIS — I5032 Chronic diastolic (congestive) heart failure: Secondary | ICD-10-CM | POA: Diagnosis present

## 2018-10-17 DIAGNOSIS — Z79899 Other long term (current) drug therapy: Secondary | ICD-10-CM | POA: Diagnosis not present

## 2018-10-17 DIAGNOSIS — I313 Pericardial effusion (noninflammatory): Secondary | ICD-10-CM | POA: Diagnosis present

## 2018-10-17 DIAGNOSIS — F419 Anxiety disorder, unspecified: Secondary | ICD-10-CM | POA: Diagnosis present

## 2018-10-17 DIAGNOSIS — I272 Pulmonary hypertension, unspecified: Secondary | ICD-10-CM | POA: Diagnosis present

## 2018-10-17 DIAGNOSIS — Z888 Allergy status to other drugs, medicaments and biological substances status: Secondary | ICD-10-CM | POA: Diagnosis not present

## 2018-10-17 DIAGNOSIS — Z833 Family history of diabetes mellitus: Secondary | ICD-10-CM | POA: Diagnosis not present

## 2018-10-17 DIAGNOSIS — R55 Syncope and collapse: Secondary | ICD-10-CM | POA: Diagnosis present

## 2018-10-17 DIAGNOSIS — I080 Rheumatic disorders of both mitral and aortic valves: Secondary | ICD-10-CM | POA: Diagnosis present

## 2018-10-17 DIAGNOSIS — E785 Hyperlipidemia, unspecified: Secondary | ICD-10-CM | POA: Diagnosis present

## 2018-10-17 DIAGNOSIS — G4733 Obstructive sleep apnea (adult) (pediatric): Secondary | ICD-10-CM | POA: Diagnosis present

## 2018-10-17 DIAGNOSIS — I110 Hypertensive heart disease with heart failure: Secondary | ICD-10-CM | POA: Diagnosis present

## 2018-10-17 DIAGNOSIS — Z8249 Family history of ischemic heart disease and other diseases of the circulatory system: Secondary | ICD-10-CM | POA: Diagnosis not present

## 2018-10-17 DIAGNOSIS — I4891 Unspecified atrial fibrillation: Secondary | ICD-10-CM | POA: Diagnosis not present

## 2018-10-17 DIAGNOSIS — H409 Unspecified glaucoma: Secondary | ICD-10-CM | POA: Diagnosis present

## 2018-10-17 DIAGNOSIS — Z20828 Contact with and (suspected) exposure to other viral communicable diseases: Secondary | ICD-10-CM | POA: Diagnosis present

## 2018-10-17 HISTORY — PX: TEE WITHOUT CARDIOVERSION: SHX5443

## 2018-10-17 LAB — APTT
aPTT: 145 seconds — ABNORMAL HIGH (ref 24–36)
aPTT: 109 seconds — ABNORMAL HIGH (ref 24–36)

## 2018-10-17 LAB — CBC
HCT: 38.7 % (ref 36.0–46.0)
Hemoglobin: 13.1 g/dL (ref 12.0–15.0)
MCH: 32.7 pg (ref 26.0–34.0)
MCHC: 33.9 g/dL (ref 30.0–36.0)
MCV: 96.5 fL (ref 80.0–100.0)
Platelets: 161 10*3/uL (ref 150–400)
RBC: 4.01 MIL/uL (ref 3.87–5.11)
RDW: 13.6 % (ref 11.5–15.5)
WBC: 5.5 10*3/uL (ref 4.0–10.5)
nRBC: 0 % (ref 0.0–0.2)

## 2018-10-17 LAB — BASIC METABOLIC PANEL
Anion gap: 8 (ref 5–15)
BUN: 11 mg/dL (ref 8–23)
CO2: 28 mmol/L (ref 22–32)
Calcium: 8.5 mg/dL — ABNORMAL LOW (ref 8.9–10.3)
Chloride: 102 mmol/L (ref 98–111)
Creatinine, Ser: 0.73 mg/dL (ref 0.44–1.00)
GFR calc Af Amer: >60 mL/min (ref >60)
GFR calc non Af Amer: >60 mL/min (ref >60)
Glucose, Bld: 91 mg/dL (ref 70–99)
Potassium: 3.6 mmol/L (ref 3.5–5.1)
Sodium: 138 mmol/L (ref 135–145)

## 2018-10-17 LAB — GLUCOSE, CAPILLARY: Glucose-Capillary: 97 mg/dL (ref 70–99)

## 2018-10-17 LAB — HEPARIN LEVEL (UNFRACTIONATED): Heparin Unfractionated: 2.12 IU/mL — ABNORMAL HIGH (ref 0.30–0.70)

## 2018-10-17 SURGERY — ECHOCARDIOGRAM, TRANSESOPHAGEAL
Anesthesia: Moderate Sedation

## 2018-10-17 MED ORDER — BUTAMBEN-TETRACAINE-BENZOCAINE 2-2-14 % EX AERO
INHALATION_SPRAY | CUTANEOUS | Status: DC | PRN
  Administered 2018-10-17: 12:00:00 2 via TOPICAL

## 2018-10-17 MED ORDER — CARVEDILOL 6.25 MG PO TABS
6.2500 mg | ORAL_TABLET | Freq: Two times a day (BID) | ORAL | Status: DC
  Administered 2018-10-17 – 2018-10-19 (×4): 6.25 mg via ORAL
  Filled 2018-10-17 (×4): qty 1

## 2018-10-17 MED ORDER — SODIUM CHLORIDE 0.9 % WEIGHT BASED INFUSION
1.0000 mL/kg/h | INTRAVENOUS | Status: DC
  Administered 2018-10-18: 1 mL/kg/h via INTRAVENOUS

## 2018-10-17 MED ORDER — FENTANYL CITRATE (PF) 100 MCG/2ML IJ SOLN
INTRAMUSCULAR | Status: DC | PRN
  Administered 2018-10-17 (×3): 25 ug via INTRAVENOUS

## 2018-10-17 MED ORDER — DIPHENHYDRAMINE HCL 50 MG/ML IJ SOLN
INTRAMUSCULAR | Status: AC
  Filled 2018-10-17: qty 1

## 2018-10-17 MED ORDER — MIDAZOLAM HCL (PF) 10 MG/2ML IJ SOLN
INTRAMUSCULAR | Status: DC | PRN
  Administered 2018-10-17: 2 mg via INTRAVENOUS
  Administered 2018-10-17: 1 mg via INTRAVENOUS
  Administered 2018-10-17: 2 mg via INTRAVENOUS

## 2018-10-17 MED ORDER — LOSARTAN POTASSIUM 50 MG PO TABS
50.0000 mg | ORAL_TABLET | Freq: Every day | ORAL | Status: DC
  Administered 2018-10-18 – 2018-10-20 (×3): 50 mg via ORAL
  Filled 2018-10-17 (×3): qty 1

## 2018-10-17 MED ORDER — MIDAZOLAM HCL (PF) 5 MG/ML IJ SOLN
INTRAMUSCULAR | Status: AC
  Filled 2018-10-17: qty 2

## 2018-10-17 MED ORDER — SODIUM CHLORIDE 0.9% FLUSH: 3.0000 mL | INTRAVENOUS | Status: DC | PRN

## 2018-10-17 MED ORDER — SODIUM CHLORIDE 0.9 % IV SOLN
INTRAVENOUS | Status: DC
  Administered 2018-10-17: 10:00:00 via INTRAVENOUS

## 2018-10-17 MED ORDER — FENTANYL CITRATE (PF) 100 MCG/2ML IJ SOLN
INTRAMUSCULAR | Status: AC
  Filled 2018-10-17: qty 2

## 2018-10-17 MED ORDER — SODIUM CHLORIDE 0.9 % WEIGHT BASED INFUSION
3.0000 mL/kg/h | INTRAVENOUS | Status: DC
  Administered 2018-10-18: 3 mL/kg/h via INTRAVENOUS

## 2018-10-17 MED ORDER — SODIUM CHLORIDE 0.9 % IV SOLN: 250.0000 mL | INTRAVENOUS | Status: DC | PRN

## 2018-10-17 NOTE — Progress Notes (Signed)
[facility] PROGRESS NOTE  Mary Farrell [id number] DOB: 1945-05-29 DOA: 10/15/2018 PCP: Lawerance Cruel, MD  Assessment/Plan:  #1.  Syncope.  Patient with intermittent episodes of lightheadedness/flushing over several weeks.  CT of the head no acute intracranial findings.  No sign symptoms of infection.  No metabolic derangements. EKG with Sinus bradycardia with 1st degree A-V block Rightward axis Incomplete right bundle branch block Nonspecific ST and T wave abnormality.  Home meds include coreg, cozaar,  HCTZ.  Echo with EF 60%, small pericardial effusion, Rheumatic mitral valve stenosis increased. Evaluated by cardiology who opine afib now valve related and recommended changing eliquis to warfarin, stress test and eval by multidisciplinary valve team.  -cardiology consult appreciated -Defer any medication adjustment to cardiology -await further cardiology recs  #2.  Hypertension.  fair control -losartan decreased to 25. -Monitor closely  #3.  Persistent A. fib.  EKG as noted above. Evaluated by cards and considered valve related. Home medications include Eliquis and flecainide.  Mali Vascor 3.  Status post ablation. -see #1  -change eliquis to warfarin. Currently heparin gtt in case procedure needed -monitor  #4.  Chronic pericardial effusion.  Stable.  #5.  Hypertension. Fair control.   Home medications include Cozaar, HCTZ, Coreg -losartan dose decreased -Monitor closely -defer medication adjustment to cardiology  #6.  Anxiety.  Appears stable at baseline  #7.  Hyperglycemia.  Serum glucose 124 on admission.  No history of diabetes.  Serum glucose this morning 112. -Monitor  #8.  Hypokalemia.  Mild.  Resolved this am.  Medications do include HCTZ. -Recheck in am   Code Status: full Family Communication: patient Disposition Plan: home when ready   Consultants:  [facility] cardiology  Procedures:  Stress  test  Antibiotics:    HPI/Subjective: Awake alert. Denies pain/discomfort  Objective: Vitals:   10/17/18 0438 10/17/18 0744  BP: (!) 122/43 (!) 140/49  Pulse: (!) 54 61  Resp: 18 20  Temp: 97.8 F (36.6 C) 98.2 F (36.8 C)  SpO2: 100% 100%    Intake/Output Summary (Last 24 hours) at 10/17/2018 0933 Last data filed at 10/17/2018 0700 Gross per 24 hour  Intake 91.92 ml  Output 650 ml  Net -558.08 ml   Filed Weights   10/16/18 0752 10/16/18 2146 10/17/18 0051  Weight: 70.6 kg 69.2 kg 69.3 kg    Exam:   General:  Awake alert no acute distress quite pleasant  Cardiovascular: irregularly irregular no mgr no LE edema  Respiratory: normal effort BS clear bilaterally no wheeze  Abdomen: non-distended soft +BS no guarding or rebounding  Musculoskeletal: joints without swelling/erythema  Data Reviewed: Basic Metabolic Panel: Recent Labs  Lab 10/15/18 1259 10/16/18 0233 10/17/18 0628  NA 135 136 138  K 4.1 3.4* 3.6  CL 97* 100 102  CO2 31 29 28   GLUCOSE 124* 112* 91  BUN 14 13 11   CREATININE 0.88 0.81 0.73  CALCIUM 9.5 8.9 8.5*   Liver Function Tests: Recent Labs  Lab 10/15/18 1259  AST 21  ALT 19  ALKPHOS 60  BILITOT 1.0  PROT 6.6  ALBUMIN 3.8   No results for input(s): LIPASE, AMYLASE in the last 168 hours. No results for input(s): AMMONIA in the last 168 hours. CBC: Recent Labs  Lab 10/15/18 1259 10/17/18 0628  WBC 6.2 5.5  HGB 13.6 13.1  HCT 42.2 38.7  MCV 98.6 96.5  PLT 206 161   Cardiac Enzymes: No results for input(s): CKTOTAL, CKMB, CKMBINDEX, TROPONINI in the last 168  hours. BNP (last 3 results) No results for input(s): BNP in the last 8760 hours.  ProBNP (last 3 results) No results for input(s): PROBNP in the last 8760 hours.  CBG: Recent Labs  Lab 10/15/18 1257 10/17/18 0654  GLUCAP 122* 97    Recent Results (from the past 240 hour(s))  SARS CORONAVIRUS 2 (TAT 6-24 HRS) Nasopharyngeal Nasopharyngeal Swab     Status:  None   Collection Time: 10/15/18  6:25 PM   Specimen: Nasopharyngeal Swab  Result Value Ref Range Status   SARS Coronavirus 2 NEGATIVE NEGATIVE Final    Comment: (NOTE) SARS-CoV-2 target nucleic acids are NOT DETECTED. The SARS-CoV-2 RNA is generally detectable in upper and lower respiratory specimens during the acute phase of infection. Negative results do not preclude SARS-CoV-2 infection, do not rule out co-infections with other pathogens, and should not be used as the sole basis for treatment or other patient management decisions. Negative results must be combined with clinical observations, patient history, and epidemiological information. The expected result is Negative. Fact Sheet for Patients: SugarRoll.be Fact Sheet for Healthcare Providers: https://www.woods-mathews.com/ This test is not yet approved or cleared by the [place] FDA and  has been authorized for detection and/or diagnosis of SARS-CoV-2 by FDA under an Emergency Use Authorization (EUA). This EUA will remain  in effect (meaning this test can be used) for the duration of the COVID-19 declaration under Section 56 4(b)(1) of the Act, 21 U.S.C. section 360bbb-3(b)(1), unless the authorization is terminated or revoked sooner. Performed at [facility], [place] [address], [place], [place] [address]      Studies: Dg Chest 2 View  Result Date: 10/15/2018 CLINICAL DATA:  Chest pain EXAM: CHEST - 2 VIEW COMPARISON:  May 15, 2017 FINDINGS: There is cardiomegaly. Both lungs are clear. The visualized skeletal structures are unremarkable. IMPRESSION: No active cardiopulmonary disease.  Cardiomegaly Electronically Signed   By: Prudencio Pair M.D.   On: 10/15/2018 15:39   Ct Head Wo Contrast  Result Date: 10/15/2018 CLINICAL DATA:  Syncope, right-sided scalp hematoma EXAM: CT HEAD WITHOUT CONTRAST CT CERVICAL SPINE WITHOUT CONTRAST TECHNIQUE: Multidetector CT imaging of the  head and cervical spine was performed following the standard protocol without intravenous contrast. Multiplanar CT image reconstructions of the cervical spine were also generated. COMPARISON:  None. FINDINGS: CT HEAD FINDINGS Brain: No evidence of acute infarction, hemorrhage, hydrocephalus, extra-axial collection or mass lesion/mass effect. Vascular: No hyperdense vessel or unexpected calcification. Skull: Normal. Negative for fracture or focal lesion. Sinuses/Orbits: No acute finding. Other: None. CT CERVICAL SPINE FINDINGS Alignment: 3 mm anterolisthesis C2 on C3. Facet joints are aligned without dislocation. Skull base and vertebrae: No acute fracture. No primary bone lesion or focal pathologic process. Soft tissues and spinal canal: No prevertebral fluid or swelling. No visible canal hematoma. Disc levels: Multilevel intervertebral disc height loss with degenerative endplate spurring, most pronounced at C5-6 and C6-7. Multilevel mild facet and uncovertebral arthropathy. No evidence of high-grade canal stenosis. Upper chest: Negative. Other: None. IMPRESSION: 1. No acute intracranial findings. 2. No acute fracture or dislocation of the cervical spine. 3. Mild-moderate multilevel degenerative changes of the cervical spine. Electronically Signed   By: Davina Poke M.D.   On: 10/15/2018 15:31   Ct Cervical Spine Wo Contrast  Result Date: 10/15/2018 CLINICAL DATA:  Syncope, right-sided scalp hematoma EXAM: CT HEAD WITHOUT CONTRAST CT CERVICAL SPINE WITHOUT CONTRAST TECHNIQUE: Multidetector CT imaging of the head and cervical spine was performed following the standard protocol without  intravenous contrast. Multiplanar CT image reconstructions of the cervical spine were also generated. COMPARISON:  None. FINDINGS: CT HEAD FINDINGS Brain: No evidence of acute infarction, hemorrhage, hydrocephalus, extra-axial collection or mass lesion/mass effect. Vascular: No hyperdense vessel or unexpected calcification.  Skull: Normal. Negative for fracture or focal lesion. Sinuses/Orbits: No acute finding. Other: None. CT CERVICAL SPINE FINDINGS Alignment: 3 mm anterolisthesis C2 on C3. Facet joints are aligned without dislocation. Skull base and vertebrae: No acute fracture. No primary bone lesion or focal pathologic process. Soft tissues and spinal canal: No prevertebral fluid or swelling. No visible canal hematoma. Disc levels: Multilevel intervertebral disc height loss with degenerative endplate spurring, most pronounced at C5-6 and C6-7. Multilevel mild facet and uncovertebral arthropathy. No evidence of high-grade canal stenosis. Upper chest: Negative. Other: None. IMPRESSION: 1. No acute intracranial findings. 2. No acute fracture or dislocation of the cervical spine. 3. Mild-moderate multilevel degenerative changes of the cervical spine. Electronically Signed   By: Davina Poke M.D.   On: 10/15/2018 15:31   Vas US Carotid  Result Date: 10/16/2018 Carotid Arterial Duplex Study Indications:       Syncope. Risk Factors:      Hypertension. Comparison Study:  No prior study. Performing Technologist: Maudry Mayhew MHA, RDMS, RVT, RDCS  Examination Guidelines: A complete evaluation includes B-mode imaging, spectral Doppler, color Doppler, and power Doppler as needed of all accessible portions of each vessel. Bilateral testing is considered an integral part of a complete examination. Limited examinations for reoccurring indications may be performed as noted.  Right Carotid Findings: +----------+--------+--------+--------+--------------------------+--------+           PSV cm/sEDV cm/sStenosisPlaque Description        Comments +----------+--------+--------+--------+--------------------------+--------+ CCA Prox  56      12              smooth and heterogenous            +----------+--------+--------+--------+--------------------------+--------+ CCA Distal73      17              heterogenous and smooth             +----------+--------+--------+--------+--------------------------+--------+ ICA Prox  75      14                                                 +----------+--------+--------+--------+--------------------------+--------+ ICA Distal185     17                                                 +----------+--------+--------+--------+--------------------------+--------+ ECA       67      6               irregular and heterogenous         +----------+--------+--------+--------+--------------------------+--------+ +----------+--------+-------+----------------+-------------------+           PSV cm/sEDV cmsDescribe        Arm Pressure (mmHG) +----------+--------+-------+----------------+-------------------+ [id number]            Multiphasic, WNL                    +----------+--------+-------+----------------+-------------------+ +---------+--------+--+--------+-+---------+ VertebralPSV cm/s20EDV cm/s5Antegrade +---------+--------+--+--------+-+---------+  Left Carotid Findings: +----------+--------+--------+--------+-----------------------+--------+  PSV cm/sEDV cm/sStenosisPlaque Description     Comments +----------+--------+--------+--------+-----------------------+--------+ CCA Prox  102     16                                              +----------+--------+--------+--------+-----------------------+--------+ CCA Distal100     16                                              +----------+--------+--------+--------+-----------------------+--------+ ICA Prox  72      11              smooth and heterogenous         +----------+--------+--------+--------+-----------------------+--------+ ICA Distal126     26                                              +----------+--------+--------+--------+-----------------------+--------+ ECA       89      4                                                +----------+--------+--------+--------+-----------------------+--------+ +----------+--------+--------+----------------+-------------------+           PSV cm/sEDV cm/sDescribe        Arm Pressure (mmHG) +----------+--------+--------+----------------+-------------------+ [id number]             Multiphasic, WNL                    +----------+--------+--------+----------------+-------------------+ +---------+--------+--+--------+-+---------+ VertebralPSV cm/s56EDV cm/s9Antegrade +---------+--------+--+--------+-+---------+  Summary: Right Carotid: Velocities in the right ICA are consistent with a 1-39% stenosis. Left Carotid: Velocities in the left ICA are consistent with a 1-39% stenosis. Vertebrals:  Bilateral vertebral arteries demonstrate antegrade flow. Subclavians: Normal flow hemodynamics were seen in bilateral subclavian              arteries. *See table(s) above for measurements and observations.  Electronically signed by Monica Martinez MD on 10/16/2018 at 4:33:23 PM.    Final     Scheduled Meds: . carvedilol  12.5 mg Oral BID WC  . famotidine  20 mg Oral QHS  . flecainide  100 mg Oral BID  . gabapentin  300 mg Oral TID  . hydrochlorothiazide  12.5 mg Oral Daily  . loratadine  10 mg Oral Daily  . losartan  25 mg Oral Daily  . oxybutynin  10 mg Oral Daily  . pantoprazole  40 mg Oral Daily  . sodium chloride flush  3 mL Intravenous Once  . sodium chloride flush  3 mL Intravenous Q12H  . sodium chloride flush  3 mL Intravenous Q12H   Continuous Infusions: . sodium chloride    . heparin 850 Units/hr (10/17/18 0836)    Principal Problem:   Syncope Active Problems:   Mitral stenosis and aortic insufficiency   Persistent atrial fibrillation   Essential hypertension   Hypokalemia   GERD (gastroesophageal reflux disease)   Anxiety   Pericardial effusion    Time spent: 46 minutes    [facility] M NP  [facility]  If 7PM-7AM,  please contact  night-coverage at [email or web address], password [facility] 10/17/2018, 9:33 AM  LOS: 0 days

## 2018-10-17 NOTE — H&P (View-Only) (Signed)
Progress Note  Patient Name: Mary Farrell Date of Encounter: 10/17/2018  Primary Cardiologist: Skeet Latch, MD   Subjective   Feeling anxious and nervous.  No chest pain, shortness of breath, lightheadedness or dizziness.  Inpatient Medications    Scheduled Meds:  carvedilol  12.5 mg Oral BID WC   famotidine  20 mg Oral QHS   flecainide  100 mg Oral BID   gabapentin  300 mg Oral TID   hydrochlorothiazide  12.5 mg Oral Daily   loratadine  10 mg Oral Daily   losartan  25 mg Oral Daily   oxybutynin  10 mg Oral Daily   pantoprazole  40 mg Oral Daily   sodium chloride flush  3 mL Intravenous Once   sodium chloride flush  3 mL Intravenous Q12H   sodium chloride flush  3 mL Intravenous Q12H   Continuous Infusions:  sodium chloride     sodium chloride     heparin 850 Units/hr (10/17/18 0836)   PRN Meds: sodium chloride, acetaminophen, ALPRAZolam, doxepin, polyethylene glycol, sodium chloride flush, traMADol   Vital Signs    Vitals:   10/17/18 0051 10/17/18 0058 10/17/18 0438 10/17/18 0744  BP:  (!) 148/76 (!) 122/43 (!) 140/49  Pulse:  (!) 57 (!) 54 61  Resp:  18 18 20   Temp:  (!) 97.5 F (36.4 C) 97.8 F (36.6 C) 98.2 F (36.8 C)  TempSrc:  Oral Oral Oral  SpO2:  100% 100% 100%  Weight: 69.3 kg     Height:        Intake/Output Summary (Last 24 hours) at 10/17/2018 1007 Last data filed at 10/17/2018 0700 Gross per 24 hour  Intake 91.92 ml  Output 650 ml  Net -558.08 ml   Last 3 Weights 10/17/2018 10/16/2018 10/16/2018  Weight (lbs) 152 lb 11.2 oz 152 lb 9.6 oz 155 lb 10.3 oz  Weight (kg) 69.264 kg 69.219 kg 70.6 kg      Telemetry    Sinus bradycardia in sinus rhythm.  Rate 50s to 60s.- Personally Reviewed  ECG    N/- Personally Reviewed  Physical Exam   VS:  BP (!) 140/49 (BP Location: Left Arm)    Pulse 61    Temp 98.2 F (36.8 C) (Oral)    Resp 20    Ht 5' (1.524 m)    Wt 69.3 kg    SpO2 100%    BMI 29.82 kg/m  , BMI Body mass  index is 29.82 kg/m. GENERAL:  Well appearing.  Anxious HEENT: Pupils equal round and reactive, fundi not visualized, oral mucosa unremarkable NECK:  No jugular venous distention, waveform within normal limits, carotid upstroke brisk and symmetric, no bruits LUNGS:  Clear to auscultation bilaterally HEART:  RRR.  PMI not displaced or sustained,S1 and S2 within normal limits, no S3, no S4, no clicks, no rubs,  II/VI diastolic murmur ABD:  Flat, positive bowel sounds normal in frequency in pitch, no bruits, no rebound, no guarding, no midline pulsatile mass, no hepatomegaly, no splenomegaly EXT:  2 plus pulses throughout, no edema, no cyanosis no clubbing SKIN:  No rashes no nodules NEURO:  Cranial nerves II through XII grossly intact, motor grossly intact throughout PSYCH:  Cognitively intact, oriented to person place and time   Labs    High Sensitivity Troponin:   Recent Labs  Lab 10/15/18 1625  TROPONINIHS 5      Chemistry Recent Labs  Lab 10/15/18 1259 10/16/18 0233 10/17/18 0628  NA 135  136 138  K 4.1 3.4* 3.6  CL 97* 100 102  CO2 31 29 28   GLUCOSE 124* 112* 91  BUN 14 13 11   CREATININE 0.88 0.81 0.73  CALCIUM 9.5 8.9 8.5*  PROT 6.6  --   --   ALBUMIN 3.8  --   --   AST 21  --   --   ALT 19  --   --   ALKPHOS 60  --   --   BILITOT 1.0  --   --   GFRNONAA >60 >60 >60  GFRAA >60 >60 >60  ANIONGAP 7 7 8      Hematology Recent Labs  Lab 10/15/18 1259 10/17/18 0628  WBC 6.2 5.5  RBC 4.28 4.01  HGB 13.6 13.1  HCT 42.2 38.7  MCV 98.6 96.5  MCH 31.8 32.7  MCHC 32.2 33.9  RDW 13.4 13.6  PLT 206 161    BNPNo results for input(s): BNP, PROBNP in the last 168 hours.   DDimer No results for input(s): DDIMER in the last 168 hours.   Radiology    Dg Chest 2 View  Result Date: 10/15/2018 CLINICAL DATA:  Chest pain EXAM: CHEST - 2 VIEW COMPARISON:  May 15, 2017 FINDINGS: There is cardiomegaly. Both lungs are clear. The visualized skeletal structures are  unremarkable. IMPRESSION: No active cardiopulmonary disease.  Cardiomegaly Electronically Signed   By: Prudencio Pair M.D.   On: 10/15/2018 15:39   Ct Head Wo Contrast  Result Date: 10/15/2018 CLINICAL DATA:  Syncope, right-sided scalp hematoma EXAM: CT HEAD WITHOUT CONTRAST CT CERVICAL SPINE WITHOUT CONTRAST TECHNIQUE: Multidetector CT imaging of the head and cervical spine was performed following the standard protocol without intravenous contrast. Multiplanar CT image reconstructions of the cervical spine were also generated. COMPARISON:  None. FINDINGS: CT HEAD FINDINGS Brain: No evidence of acute infarction, hemorrhage, hydrocephalus, extra-axial collection or mass lesion/mass effect. Vascular: No hyperdense vessel or unexpected calcification. Skull: Normal. Negative for fracture or focal lesion. Sinuses/Orbits: No acute finding. Other: None. CT CERVICAL SPINE FINDINGS Alignment: 3 mm anterolisthesis C2 on C3. Facet joints are aligned without dislocation. Skull base and vertebrae: No acute fracture. No primary bone lesion or focal pathologic process. Soft tissues and spinal canal: No prevertebral fluid or swelling. No visible canal hematoma. Disc levels: Multilevel intervertebral disc height loss with degenerative endplate spurring, most pronounced at C5-6 and C6-7. Multilevel mild facet and uncovertebral arthropathy. No evidence of high-grade canal stenosis. Upper chest: Negative. Other: None. IMPRESSION: 1. No acute intracranial findings. 2. No acute fracture or dislocation of the cervical spine. 3. Mild-moderate multilevel degenerative changes of the cervical spine. Electronically Signed   By: Davina Poke M.D.   On: 10/15/2018 15:31   Ct Cervical Spine Wo Contrast  Result Date: 10/15/2018 CLINICAL DATA:  Syncope, right-sided scalp hematoma EXAM: CT HEAD WITHOUT CONTRAST CT CERVICAL SPINE WITHOUT CONTRAST TECHNIQUE: Multidetector CT imaging of the head and cervical spine was performed following the  standard protocol without intravenous contrast. Multiplanar CT image reconstructions of the cervical spine were also generated. COMPARISON:  None. FINDINGS: CT HEAD FINDINGS Brain: No evidence of acute infarction, hemorrhage, hydrocephalus, extra-axial collection or mass lesion/mass effect. Vascular: No hyperdense vessel or unexpected calcification. Skull: Normal. Negative for fracture or focal lesion. Sinuses/Orbits: No acute finding. Other: None. CT CERVICAL SPINE FINDINGS Alignment: 3 mm anterolisthesis C2 on C3. Facet joints are aligned without dislocation. Skull base and vertebrae: No acute fracture. No primary bone lesion or focal pathologic process. Soft tissues  and spinal canal: No prevertebral fluid or swelling. No visible canal hematoma. Disc levels: Multilevel intervertebral disc height loss with degenerative endplate spurring, most pronounced at C5-6 and C6-7. Multilevel mild facet and uncovertebral arthropathy. No evidence of high-grade canal stenosis. Upper chest: Negative. Other: None. IMPRESSION: 1. No acute intracranial findings. 2. No acute fracture or dislocation of the cervical spine. 3. Mild-moderate multilevel degenerative changes of the cervical spine. Electronically Signed   By: Davina Poke M.D.   On: 10/15/2018 15:31   Vas US Carotid  Result Date: 10/16/2018 Carotid Arterial Duplex Study Indications:       Syncope. Risk Factors:      Hypertension. Comparison Study:  No prior study. Performing Technologist: Maudry Mayhew MHA, RDMS, RVT, RDCS  Examination Guidelines: A complete evaluation includes B-mode imaging, spectral Doppler, color Doppler, and power Doppler as needed of all accessible portions of each vessel. Bilateral testing is considered an integral part of a complete examination. Limited examinations for reoccurring indications may be performed as noted.  Right Carotid Findings: +----------+--------+--------+--------+--------------------------+--------+             PSV  cm/s EDV cm/s Stenosis Plaque Description         Comments  +----------+--------+--------+--------+--------------------------+--------+  CCA Prox   56       12                smooth and heterogenous              +----------+--------+--------+--------+--------------------------+--------+  CCA Distal 73       17                heterogenous and smooth              +----------+--------+--------+--------+--------------------------+--------+  ICA Prox   75       14                                                     +----------+--------+--------+--------+--------------------------+--------+  ICA Distal 185      17                                                     +----------+--------+--------+--------+--------------------------+--------+  ECA        67       6                 irregular and heterogenous           +----------+--------+--------+--------+--------------------------+--------+ +----------+--------+-------+----------------+-------------------+             PSV cm/s EDV cms Describe         Arm Pressure (mmHG)  +----------+--------+-------+----------------+-------------------+  Subclavian 139              Multiphasic, WNL                      +----------+--------+-------+----------------+-------------------+ +---------+--------+--+--------+-+---------+  Vertebral PSV cm/s 20 EDV cm/s 5 Antegrade  +---------+--------+--+--------+-+---------+  Left Carotid Findings: +----------+--------+--------+--------+-----------------------+--------+             PSV cm/s EDV cm/s Stenosis Plaque Description      Comments  +----------+--------+--------+--------+-----------------------+--------+  CCA Prox   102  16                                                  +----------+--------+--------+--------+-----------------------+--------+  CCA Distal 100      16                                                  +----------+--------+--------+--------+-----------------------+--------+  ICA Prox   72       11                smooth  and heterogenous           +----------+--------+--------+--------+-----------------------+--------+  ICA Distal 126      26                                                  +----------+--------+--------+--------+-----------------------+--------+  ECA        89       4                                                   +----------+--------+--------+--------+-----------------------+--------+ +----------+--------+--------+----------------+-------------------+             PSV cm/s EDV cm/s Describe         Arm Pressure (mmHG)  +----------+--------+--------+----------------+-------------------+  Subclavian 118               Multiphasic, WNL                      +----------+--------+--------+----------------+-------------------+ +---------+--------+--+--------+-+---------+  Vertebral PSV cm/s 56 EDV cm/s 9 Antegrade  +---------+--------+--+--------+-+---------+  Summary: Right Carotid: Velocities in the right ICA are consistent with a 1-39% stenosis. Left Carotid: Velocities in the left ICA are consistent with a 1-39% stenosis. Vertebrals:  Bilateral vertebral arteries demonstrate antegrade flow. Subclavians: Normal flow hemodynamics were seen in bilateral subclavian              arteries. *See table(s) above for measurements and observations.  Electronically signed by Monica Martinez MD on 10/16/2018 at 4:33:23 PM.    Final     Cardiac Studies   TTE 10/16/18:  IMPRESSIONS   1. Left ventricular ejection fraction, by visual estimation, is 60 to 65%. The left ventricle has normal function. Normal left ventricular size. There is no left ventricular hypertrophy.  2. Global right ventricle has normal systolic function.The right ventricular size is normal. No increase in right ventricular wall thickness.  3. Left atrial size was severely dilated.  4. Right atrial size was normal.  5. Small pericardial effusion.  6. Mild calcification of the posterior mitral valve leaflet(s).  7. Moderate thickening of the  posterior mitral valve leaflet(s).  8. Moderately decreased mobility of the mitral valve leaflets.  9. The mitral valve is rheumatic. Mild mitral valve regurgitation. Moderate-severe mitral stenosis. 10. Mean transmitral gradient is 31mmHg.     Viewed personally TEE from 2019 and value leaflets appear thin and non calcified. Harmonics  on current echocardiogram may be causing artifactual thickening. 11. The tricuspid valve is normal in structure. Tricuspid valve regurgitation was not visualized by color flow Doppler. 12. The aortic valve is normal in structure. Aortic valve regurgitation is mild by color flow Doppler. Mild aortic valve sclerosis without stenosis. 13. The pulmonic valve was normal in structure. Pulmonic valve regurgitation is trivial by color flow Doppler. 14. Mildly elevated pulmonary artery systolic pressure. 15. The inferior vena cava is normal in size with greater than 50% respiratory variability, suggesting right atrial pressure of 3 mmHg. 16. Rheumatic mitral valve stenosis. Transmitral gradient has increased. Consider repeat TEE and stress echo to evaluate transmitral gradient during exercise. May need a candidate for balloon valvuloplasty.  Patient Profile     73 y.o. female 52F with mild aortic regurgitation, moderate to severe mitral stenosis, Rheumatic mitral valve disease, chronic pericardial effusion, paroxysmal atrial flutter s/p ablation 05/2017, and hypertension here with syncope.  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  The mean gradient was 6 mmHg on 5/18 and 7 mmHg on this admission.  However the leaflets are much thicker and there is a hyper echoic density on the A1/P1 segments.  Her case was discussed with Dr. ON.  He is concern for thrombus on the valve.  We will get a TEE to better evaluate.  Given her progressive disease, would consider her to have  valvular atrial fibrillation.  We will switch from Eliquis to warfarin.  She is currently on heparin and will start warfarin once we are sure she does not need any invasive procedures.  Given that the obstruction does not appear to be severe, it is unlikely that this is the cause of her syncope.  # Essential hypertension:  Blood pressure is poorly-controlled.  However she was admitted with syncope, so we will not be too aggressive at this time.  It is quite possible that her bradycardia contributed to her episode.  Since admission her heart rates have been mostly in the 50s to 60s.  Given her recurrent atrial fibrillation with an episode a few days prior to admission, we will not lower her flecainide dose.  However we will reduce carvedilol to 6.25 mg.  Given this change and her poorly controlled blood pressure we will change losartan back to 50 mg daily.  It was previously reduced to 25 mg.  # Persistent atrial fibrillation: Reduce carvedilol as above.  Continue flecainide.  Eliquis was discontinued and she is now on heparin with anticipation of starting warfarin this admission for valvular atrial fibrillation.      For questions or updates, please contact [place] Please consult [email or web address] for contact info under        Signed, Skeet Latch, MD  10/17/2018, 10:07 AM

## 2018-10-17 NOTE — Progress Notes (Signed)
ANTICOAGULATION CONSULT NOTE - Initial Consult  Pharmacy Consult for apixaban>>heparin Indication: atrial fibrillation  Allergies  Allergen Reactions  . Amoxicillin Diarrhea    Has patient had a PCN reaction causing immediate rash, facial/tongue/throat swelling, SOB or lightheadedness with hypotension: no Has patient had a PCN reaction causing severe rash involving mucus membranes or skin necrosis: no Has patient had a PCN reaction that required hospitalization: no Has patient had a PCN reaction occurring within the last 10 years: no If all of the above answers are "NO", then may proceed with Cephalosporin use.   . Metoprolol Other (See Comments)    Mouth tastes like mold  . Metoprolol Tartrate     Other reaction(s): Other (See Comments) Mouth sores  . Oxaprozin Nausea Only    Patient Measurements: Height: 5' (152.4 cm) Weight: 152 lb 11.2 oz (69.3 kg) IBW/kg (Calculated) : 45.5 Heparin Dosing Weight: 60kg  Vital Signs: Temp: 98.2 F (36.8 C) (09/22 0744) Temp Source: Oral (09/22 0744) BP: 140/49 (09/22 0744) Pulse Rate: 61 (09/22 0744)  Labs: Recent Labs    10/15/18 1259 10/15/18 1625 10/16/18 0233 10/17/18 0628  HGB 13.6  --   --  13.1  HCT 42.2  --   --  38.7  PLT 206  --   --  161  APTT  --   --   --  145*  HEPARINUNFRC  --   --   --  2.12*  CREATININE 0.88  --  0.81 0.73  TROPONINIHS  --  5  --   --     Estimated Creatinine Clearance: 53.6 mL/min (by C-G formula based on SCr of 0.73 mg/dL).   Medical History: Past Medical History:  Diagnosis Date  . Anxiety 06/29/2012  . Aortic regurgitation 08/13/2015  . Arthritis    "hands, wrists, back, feet, toes" (06/24/2017)  . Atrial flutter ([place]) 09/18/2015  . Atypical chest pain 05/13/2016  . Chest pain    remote cath in 1996 with NORMAL coronaries noted  . CHF (congestive heart failure) ([place])   . Dyspnea 10/30/2010  . Essential hypertension 09/05/2013  . Exogenous obesity    history of   . GERD  (gastroesophageal reflux disease)   . Glaucoma, both eyes   . Headache, migraine    "stopped in my 50's" (09/18/2015)  . History of cardiovascular stress test 2007   showing no ischemia  . Hypertension   . Mitral stenosis and aortic insufficiency 06/23/2010  . Mitral stenosis with insufficiency   . Murmur    of mitral stenosis and moderate aortic insufficiency      . Nausea 05/01/2018  . OSA on CPAP   . Palpitations    occasional  . Paroxysmal A-fib ([place]) 06/24/2017  . Pericardial effusion 09/18/2015  . Persistent atrial fibrillation 03/10/2017  . Personal history of rheumatic heart disease   . SBE (subacute bacterial endocarditis)    prophylaxis  . Sliding hiatal hernia   . Vertigo 05/01/2018   Assessment: 73 year old female with afib, possibly valvular in nature now. Structural heart team to assess. Last apixaban dose 9/21, will transition heparin in case procedures are needed, plan to eventually transition to warfarin.   AM heparin level elevated as expected with recent Eliquis, aPTT also above goal.  RN unsure if level drawn close to where IV heparin is infusing.  No overt bleeding or complications noted.  Goal of Therapy:  Heparin level 0.3-0.7 units/ml aPTT 66-102s seconds Monitor platelets by anticoagulation protocol: Yes   Plan:  Decrease IV heparin to 850 units/hr. Recheck aPTT in 8 hrs. Continue to monitor H&H and platelets  Marguerite Olea, [facility] Clinical Pharmacist Phone [phone]  10/17/2018 8:36 AM

## 2018-10-17 NOTE — Progress Notes (Signed)
Echocardiogram Echocardiogram Transesophageal has been performed.  Mary Farrell [name] 10/17/2018, 1:02 PM

## 2018-10-17 NOTE — Progress Notes (Signed)
Progress Note  Patient Name: Mary Farrell Date of Encounter: 10/17/2018  Primary Cardiologist: Skeet Latch, MD   Subjective   Feeling anxious and nervous.  No chest pain, shortness of breath, lightheadedness or dizziness.  Inpatient Medications    Scheduled Meds:  carvedilol  12.5 mg Oral BID WC   famotidine  20 mg Oral QHS   flecainide  100 mg Oral BID   gabapentin  300 mg Oral TID   hydrochlorothiazide  12.5 mg Oral Daily   loratadine  10 mg Oral Daily   losartan  25 mg Oral Daily   oxybutynin  10 mg Oral Daily   pantoprazole  40 mg Oral Daily   sodium chloride flush  3 mL Intravenous Once   sodium chloride flush  3 mL Intravenous Q12H   sodium chloride flush  3 mL Intravenous Q12H   Continuous Infusions:  sodium chloride     sodium chloride     heparin 850 Units/hr (10/17/18 0836)   PRN Meds: sodium chloride, acetaminophen, ALPRAZolam, doxepin, polyethylene glycol, sodium chloride flush, traMADol   Vital Signs    Vitals:   10/17/18 0051 10/17/18 0058 10/17/18 0438 10/17/18 0744  BP:  (!) 148/76 (!) 122/43 (!) 140/49  Pulse:  (!) 57 (!) 54 61  Resp:  18 18 20   Temp:  (!) 97.5 F (36.4 C) 97.8 F (36.6 C) 98.2 F (36.8 C)  TempSrc:  Oral Oral Oral  SpO2:  100% 100% 100%  Weight: 69.3 kg     Height:        Intake/Output Summary (Last 24 hours) at 10/17/2018 1007 Last data filed at 10/17/2018 0700 Gross per 24 hour  Intake 91.92 ml  Output 650 ml  Net -558.08 ml   Last 3 Weights 10/17/2018 10/16/2018 10/16/2018  Weight (lbs) 152 lb 11.2 oz 152 lb 9.6 oz 155 lb 10.3 oz  Weight (kg) 69.264 kg 69.219 kg 70.6 kg      Telemetry    Sinus bradycardia in sinus rhythm.  Rate 50s to 60s.- Personally Reviewed  ECG    N/- Personally Reviewed  Physical Exam   VS:  BP (!) 140/49 (BP Location: Left Arm)    Pulse 61    Temp 98.2 F (36.8 C) (Oral)    Resp 20    Ht 5' (1.524 m)    Wt 69.3 kg    SpO2 100%    BMI 29.82 kg/m  , BMI Body mass  index is 29.82 kg/m. GENERAL:  Well appearing.  Anxious HEENT: Pupils equal round and reactive, fundi not visualized, oral mucosa unremarkable NECK:  No jugular venous distention, waveform within normal limits, carotid upstroke brisk and symmetric, no bruits LUNGS:  Clear to auscultation bilaterally HEART:  RRR.  PMI not displaced or sustained,S1 and S2 within normal limits, no S3, no S4, no clicks, no rubs,  II/VI diastolic murmur ABD:  Flat, positive bowel sounds normal in frequency in pitch, no bruits, no rebound, no guarding, no midline pulsatile mass, no hepatomegaly, no splenomegaly EXT:  2 plus pulses throughout, no edema, no cyanosis no clubbing SKIN:  No rashes no nodules NEURO:  Cranial nerves II through XII grossly intact, motor grossly intact throughout PSYCH:  Cognitively intact, oriented to person place and time   Labs    High Sensitivity Troponin:   Recent Labs  Lab 10/15/18 1625  TROPONINIHS 5      Chemistry Recent Labs  Lab 10/15/18 1259 10/16/18 0233 10/17/18 0628  NA 135  136 138  K 4.1 3.4* 3.6  CL 97* 100 102  CO2 31 29 28   GLUCOSE 124* 112* 91  BUN 14 13 11   CREATININE 0.88 0.81 0.73  CALCIUM 9.5 8.9 8.5*  PROT 6.6  --   --   ALBUMIN 3.8  --   --   AST 21  --   --   ALT 19  --   --   ALKPHOS 60  --   --   BILITOT 1.0  --   --   GFRNONAA >60 >60 >60  GFRAA >60 >60 >60  ANIONGAP 7 7 8      Hematology Recent Labs  Lab 10/15/18 1259 10/17/18 0628  WBC 6.2 5.5  RBC 4.28 4.01  HGB 13.6 13.1  HCT 42.2 38.7  MCV 98.6 96.5  MCH 31.8 32.7  MCHC 32.2 33.9  RDW 13.4 13.6  PLT 206 161    BNPNo results for input(s): BNP, PROBNP in the last 168 hours.   DDimer No results for input(s): DDIMER in the last 168 hours.   Radiology    Dg Chest 2 View  Result Date: 10/15/2018 CLINICAL DATA:  Chest pain EXAM: CHEST - 2 VIEW COMPARISON:  May 15, 2017 FINDINGS: There is cardiomegaly. Both lungs are clear. The visualized skeletal structures are  unremarkable. IMPRESSION: No active cardiopulmonary disease.  Cardiomegaly Electronically Signed   By: Prudencio Pair M.D.   On: 10/15/2018 15:39   Ct Head Wo Contrast  Result Date: 10/15/2018 CLINICAL DATA:  Syncope, right-sided scalp hematoma EXAM: CT HEAD WITHOUT CONTRAST CT CERVICAL SPINE WITHOUT CONTRAST TECHNIQUE: Multidetector CT imaging of the head and cervical spine was performed following the standard protocol without intravenous contrast. Multiplanar CT image reconstructions of the cervical spine were also generated. COMPARISON:  None. FINDINGS: CT HEAD FINDINGS Brain: No evidence of acute infarction, hemorrhage, hydrocephalus, extra-axial collection or mass lesion/mass effect. Vascular: No hyperdense vessel or unexpected calcification. Skull: Normal. Negative for fracture or focal lesion. Sinuses/Orbits: No acute finding. Other: None. CT CERVICAL SPINE FINDINGS Alignment: 3 mm anterolisthesis C2 on C3. Facet joints are aligned without dislocation. Skull base and vertebrae: No acute fracture. No primary bone lesion or focal pathologic process. Soft tissues and spinal canal: No prevertebral fluid or swelling. No visible canal hematoma. Disc levels: Multilevel intervertebral disc height loss with degenerative endplate spurring, most pronounced at C5-6 and C6-7. Multilevel mild facet and uncovertebral arthropathy. No evidence of high-grade canal stenosis. Upper chest: Negative. Other: None. IMPRESSION: 1. No acute intracranial findings. 2. No acute fracture or dislocation of the cervical spine. 3. Mild-moderate multilevel degenerative changes of the cervical spine. Electronically Signed   By: Davina Poke M.D.   On: 10/15/2018 15:31   Ct Cervical Spine Wo Contrast  Result Date: 10/15/2018 CLINICAL DATA:  Syncope, right-sided scalp hematoma EXAM: CT HEAD WITHOUT CONTRAST CT CERVICAL SPINE WITHOUT CONTRAST TECHNIQUE: Multidetector CT imaging of the head and cervical spine was performed following the  standard protocol without intravenous contrast. Multiplanar CT image reconstructions of the cervical spine were also generated. COMPARISON:  None. FINDINGS: CT HEAD FINDINGS Brain: No evidence of acute infarction, hemorrhage, hydrocephalus, extra-axial collection or mass lesion/mass effect. Vascular: No hyperdense vessel or unexpected calcification. Skull: Normal. Negative for fracture or focal lesion. Sinuses/Orbits: No acute finding. Other: None. CT CERVICAL SPINE FINDINGS Alignment: 3 mm anterolisthesis C2 on C3. Facet joints are aligned without dislocation. Skull base and vertebrae: No acute fracture. No primary bone lesion or focal pathologic process. Soft tissues  and spinal canal: No prevertebral fluid or swelling. No visible canal hematoma. Disc levels: Multilevel intervertebral disc height loss with degenerative endplate spurring, most pronounced at C5-6 and C6-7. Multilevel mild facet and uncovertebral arthropathy. No evidence of high-grade canal stenosis. Upper chest: Negative. Other: None. IMPRESSION: 1. No acute intracranial findings. 2. No acute fracture or dislocation of the cervical spine. 3. Mild-moderate multilevel degenerative changes of the cervical spine. Electronically Signed   By: Davina Poke M.D.   On: 10/15/2018 15:31   Vas US Carotid  Result Date: 10/16/2018 Carotid Arterial Duplex Study Indications:       Syncope. Risk Factors:      Hypertension. Comparison Study:  No prior study. Performing Technologist: Maudry Mayhew MHA, RDMS, RVT, RDCS  Examination Guidelines: A complete evaluation includes B-mode imaging, spectral Doppler, color Doppler, and power Doppler as needed of all accessible portions of each vessel. Bilateral testing is considered an integral part of a complete examination. Limited examinations for reoccurring indications may be performed as noted.  Right Carotid Findings: +----------+--------+--------+--------+--------------------------+--------+             PSV  cm/s EDV cm/s Stenosis Plaque Description         Comments  +----------+--------+--------+--------+--------------------------+--------+  CCA Prox   56       12                smooth and heterogenous              +----------+--------+--------+--------+--------------------------+--------+  CCA Distal 73       17                heterogenous and smooth              +----------+--------+--------+--------+--------------------------+--------+  ICA Prox   75       14                                                     +----------+--------+--------+--------+--------------------------+--------+  ICA Distal 185      17                                                     +----------+--------+--------+--------+--------------------------+--------+  ECA        67       6                 irregular and heterogenous           +----------+--------+--------+--------+--------------------------+--------+ +----------+--------+-------+----------------+-------------------+             PSV cm/s EDV cms Describe         Arm Pressure (mmHG)  +----------+--------+-------+----------------+-------------------+  Subclavian 139              Multiphasic, WNL                      +----------+--------+-------+----------------+-------------------+ +---------+--------+--+--------+-+---------+  Vertebral PSV cm/s 20 EDV cm/s 5 Antegrade  +---------+--------+--+--------+-+---------+  Left Carotid Findings: +----------+--------+--------+--------+-----------------------+--------+             PSV cm/s EDV cm/s Stenosis Plaque Description      Comments  +----------+--------+--------+--------+-----------------------+--------+  CCA Prox   102  16                                                  +----------+--------+--------+--------+-----------------------+--------+  CCA Distal 100      16                                                  +----------+--------+--------+--------+-----------------------+--------+  ICA Prox   72       11                smooth  and heterogenous           +----------+--------+--------+--------+-----------------------+--------+  ICA Distal 126      26                                                  +----------+--------+--------+--------+-----------------------+--------+  ECA        89       4                                                   +----------+--------+--------+--------+-----------------------+--------+ +----------+--------+--------+----------------+-------------------+             PSV cm/s EDV cm/s Describe         Arm Pressure (mmHG)  +----------+--------+--------+----------------+-------------------+  Subclavian 118               Multiphasic, WNL                      +----------+--------+--------+----------------+-------------------+ +---------+--------+--+--------+-+---------+  Vertebral PSV cm/s 56 EDV cm/s 9 Antegrade  +---------+--------+--+--------+-+---------+  Summary: Right Carotid: Velocities in the right ICA are consistent with a 1-39% stenosis. Left Carotid: Velocities in the left ICA are consistent with a 1-39% stenosis. Vertebrals:  Bilateral vertebral arteries demonstrate antegrade flow. Subclavians: Normal flow hemodynamics were seen in bilateral subclavian              arteries. *See table(s) above for measurements and observations.  Electronically signed by Monica Martinez MD on 10/16/2018 at 4:33:23 PM.    Final     Cardiac Studies   TTE 10/16/18:  IMPRESSIONS   1. Left ventricular ejection fraction, by visual estimation, is 60 to 65%. The left ventricle has normal function. Normal left ventricular size. There is no left ventricular hypertrophy.  2. Global right ventricle has normal systolic function.The right ventricular size is normal. No increase in right ventricular wall thickness.  3. Left atrial size was severely dilated.  4. Right atrial size was normal.  5. Small pericardial effusion.  6. Mild calcification of the posterior mitral valve leaflet(s).  7. Moderate thickening of the  posterior mitral valve leaflet(s).  8. Moderately decreased mobility of the mitral valve leaflets.  9. The mitral valve is rheumatic. Mild mitral valve regurgitation. Moderate-severe mitral stenosis. 10. Mean transmitral gradient is 11mmHg.     Viewed personally TEE from 2019 and value leaflets appear thin and non calcified. Harmonics  on current echocardiogram may be causing artifactual thickening. 11. The tricuspid valve is normal in structure. Tricuspid valve regurgitation was not visualized by color flow Doppler. 12. The aortic valve is normal in structure. Aortic valve regurgitation is mild by color flow Doppler. Mild aortic valve sclerosis without stenosis. 13. The pulmonic valve was normal in structure. Pulmonic valve regurgitation is trivial by color flow Doppler. 14. Mildly elevated pulmonary artery systolic pressure. 15. The inferior vena cava is normal in size with greater than 50% respiratory variability, suggesting right atrial pressure of 3 mmHg. 16. Rheumatic mitral valve stenosis. Transmitral gradient has increased. Consider repeat TEE and stress echo to evaluate transmitral gradient during exercise. May need a candidate for balloon valvuloplasty.  Patient Profile     73 y.o. female 56F with mild aortic regurgitation, moderate to severe mitral stenosis, Rheumatic mitral valve disease, chronic pericardial effusion, paroxysmal atrial flutter s/p ablation 05/2017, and hypertension here with syncope.  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  The mean gradient was 6 mmHg on 5/18 and 7 mmHg on this admission.  However the leaflets are much thicker and there is a hyper echoic density on the A1/P1 segments.  Her case was discussed with Dr. ON.  He is concern for thrombus on the valve.  We will get a TEE to better evaluate.  Given her progressive disease, would consider her to have  valvular atrial fibrillation.  We will switch from Eliquis to warfarin.  She is currently on heparin and will start warfarin once we are sure she does not need any invasive procedures.  Given that the obstruction does not appear to be severe, it is unlikely that this is the cause of her syncope.  # Essential hypertension:  Blood pressure is poorly-controlled.  However she was admitted with syncope, so we will not be too aggressive at this time.  It is quite possible that her bradycardia contributed to her episode.  Since admission her heart rates have been mostly in the 50s to 60s.  Given her recurrent atrial fibrillation with an episode a few days prior to admission, we will not lower her flecainide dose.  However we will reduce carvedilol to 6.25 mg.  Given this change and her poorly controlled blood pressure we will change losartan back to 50 mg daily.  It was previously reduced to 25 mg.  # Persistent atrial fibrillation: Reduce carvedilol as above.  Continue flecainide.  Eliquis was discontinued and she is now on heparin with anticipation of starting warfarin this admission for valvular atrial fibrillation.      For questions or updates, please contact [place] Please consult [email or web address] for contact info under        Signed, Skeet Latch, MD  10/17/2018, 10:07 AM

## 2018-10-17 NOTE — Progress Notes (Signed)
   TEE today showed moderate to severe mitral stenosis. Dr. Oval Linsey discussed patient with Dr. Roxy Manns and recommendation is for right/left heart catheterization tomorrow. Discussed this plan with the patient. The patient understands that risks include but are not limited to stroke (1 in 1000), death (1 in 49), kidney failure [usually temporary] (1 in 500), bleeding (1 in 200), allergic reaction [possibly serious] (1 in 200), and agrees to proceed.   Procedure is scheduled for right/left heart cath tomorrow at 10:00am with Dr. Ellyn Hack. Will place orders and make patient NPO at midnight.  Darreld Mclean, PA-C 10/17/2018 6:56 PM

## 2018-10-17 NOTE — Interval H&P Note (Signed)
History and Physical Interval Note:  10/17/2018 11:21 AM  Mary Farrell  has presented today for surgery, with the diagnosis of mitral valve stenosis.  The various methods of treatment have been discussed with the patient and family. After consideration of risks, benefits and other options for treatment, the patient has consented to  Procedure(s): TRANSESOPHAGEAL ECHOCARDIOGRAM (TEE) (N/A) as a surgical intervention.  The patient's history has been reviewed, patient examined, no change in status, stable for surgery.  I have reviewed the patient's chart and labs.  Questions were answered to the patient's satisfaction.     [employer]

## 2018-10-17 NOTE — CV Procedure (Addendum)
   Transesophageal Echocardiogram  Indications: Mitral stenosis  Time out performed  During this procedure the patient is administered a total of Versed 75 mg and Fentanyl 5 mcg to achieve and maintain moderate conscious sedation.  The patient's heart rate, blood pressure, and oxygen saturation are monitored continuously during the procedure. The period of conscious sedation is 25 minutes, of which I was present face-to-face 100% of this time.  Findings:  Left Ventricle: Normal EF 55%   Mitral Valve: Moderate stenosis by pressure half time 1.2cm squared and mean gradient (43mHg). PISA radius 1.2cm, ERO 1.2 cm squared.  Valve leaflets are thin, pliable, no subvalvular thickening, however surrounding P1 (posterior leaflet) there is a calcified fixed nodule (MAC like appearance).    Aortic Valve: Mildly calcified. Mild AI  Tricuspid Valve: RVSP - 668mg, Moderate to severe.   Left Atrium: Dilated (5.3cm diameter)  IMPRESSION: Rheumatic moderate mitral stenosis.  However pulmonary pressures are high (60-6343m). Although leaflets are thin, pliable and without subvalvular thickening, she does have a calcified density on P1 which would likely increase risk with balloon valvuloplasty.   Consider stress echo with transmitral gradient measurements.   Her moderate to severely elevated pulmonary pressures may indicate along with AFIB and markedly dilated LA along with symptoms, that mitral valve replacement is indicated.   Discussed with referring, Dr. RanOval Linsey MarCandee FurbishD

## 2018-10-17 NOTE — Progress Notes (Signed)
ANTICOAGULATION CONSULT NOTE - Follow-Up Consult  Pharmacy Consult for apixaban>>heparin Indication: atrial fibrillation  Allergies  Allergen Reactions  . Amoxicillin Diarrhea    Has patient had a PCN reaction causing immediate rash, facial/tongue/throat swelling, SOB or lightheadedness with hypotension: no Has patient had a PCN reaction causing severe rash involving mucus membranes or skin necrosis: no Has patient had a PCN reaction that required hospitalization: no Has patient had a PCN reaction occurring within the last 10 years: no If all of the above answers are "NO", then may proceed with Cephalosporin use.   . Metoprolol Other (See Comments)    Mouth tastes like mold  . Metoprolol Tartrate     Other reaction(s): Other (See Comments) Mouth sores  . Oxaprozin Nausea Only    Patient Measurements: Height: 5' (152.4 cm) Weight: 152 lb 11.2 oz (69.3 kg) IBW/kg (Calculated) : 45.5 Heparin Dosing Weight: 60kg  Vital Signs: Temp: 97.5 F (36.4 C) (09/22 1335) Temp Source: Oral (09/22 1335) BP: 127/51 (09/22 1335) Pulse Rate: 70 (09/22 1335)  Labs: Recent Labs    10/15/18 1259 10/15/18 1625 10/16/18 0233 10/17/18 0628 10/17/18 1657  HGB 13.6  --   --  13.1  --   HCT 42.2  --   --  38.7  --   PLT 206  --   --  161  --   APTT  --   --   --  145* 109*  HEPARINUNFRC  --   --   --  2.12*  --   CREATININE 0.88  --  0.81 0.73  --   TROPONINIHS  --  5  --   --   --     Estimated Creatinine Clearance: 53.6 mL/min (by C-G formula based on SCr of 0.73 mg/dL).   Medical History: Past Medical History:  Diagnosis Date  . Anxiety 06/29/2012  . Aortic regurgitation 08/13/2015  . Arthritis    "hands, wrists, back, feet, toes" (06/24/2017)  . Atrial flutter ([place]) 09/18/2015  . Atypical chest pain 05/13/2016  . Chest pain    remote cath in 1996 with NORMAL coronaries noted  . CHF (congestive heart failure) ([place])   . Dyspnea 10/30/2010  . Essential hypertension 09/05/2013  .  Exogenous obesity    history of   . GERD (gastroesophageal reflux disease)   . Glaucoma, both eyes   . Headache, migraine    "stopped in my 50's" (09/18/2015)  . History of cardiovascular stress test 2007   showing no ischemia  . Hypertension   . Mitral stenosis and aortic insufficiency 06/23/2010  . Mitral stenosis with insufficiency   . Murmur    of mitral stenosis and moderate aortic insufficiency      . Nausea 05/01/2018  . OSA on CPAP   . Palpitations    occasional  . Paroxysmal A-fib ([place]) 06/24/2017  . Pericardial effusion 09/18/2015  . Persistent atrial fibrillation 03/10/2017  . Personal history of rheumatic heart disease   . SBE (subacute bacterial endocarditis)    prophylaxis  . Sliding hiatal hernia   . Vertigo 05/01/2018   Assessment: 73 year old female with afib, possibly valvular in nature now. Structural heart team to assess. Last apixaban dose 9/21, will transition to heparin in case procedures are needed, plan to eventually transition to warfarin.   aPTT remains above goal but trending down after rate decrease.  No overt bleeding or complications noted.  Goal of Therapy:  Heparin level 0.3-0.7 units/ml aPTT 66-102s seconds Monitor platelets by  anticoagulation protocol: Yes   Plan:  Decrease IV heparin to 750 units/hr. Next aPTT with AM labs. Continue to monitor H&H and platelets  [employer], Pharm.D., BCPS Clinical Pharmacist  **Pharmacist phone directory can now be found on [email or web address] ([email or web address]).  Listed under [place].  10/17/2018 6:28 PM

## 2018-10-17 NOTE — Progress Notes (Signed)
Transitions of Care Pharmacist Note  Mary Farrell is a 73 y.o. female that has been diagnosed with atrial fibrillation and will be prescribed Coumadin (warfarin)  at discharge.   Patient Education: I provided the following education on Coumadin to the patient: How to take the medication Described what the medication is Signs of bleeding Signs/symptoms of VTE and stroke  Answered their questions Dietary and medication precautions with warfarin   Discharge Medications Plan: As of now, the patient wants to have their discharge medications filled by the Transitions of Care pharmacy rather than their usual pharmacy.   The discharge orders pharmacy has been changed to the Transitions of Care pharmacy, the patient will receive a phone call regarding co-pay, and their medications will be delivered by the Transitions of Care pharmacy.   Insurance information: N/A  Thank you,   Eddie Candle, PharmD PGY-1 Pharmacy Resident   Please check amion for clinical pharmacist contact number   October 17, 2018

## 2018-10-17 NOTE — Discharge Instructions (Signed)
Information on my medicine - Coumadin   (Warfarin)  This medication education was reviewed with me or my healthcare representative as part of my discharge preparation.  The pharmacist that spoke with me during my hospital stay was:  Maryan Char Naileah Karg, RPH  Why was Coumadin prescribed for you? Coumadin was prescribed for you because you have a blood clot or a medical condition that can cause an increased risk of forming blood clots. Blood clots can cause serious health problems by blocking the flow of blood to the heart, lung, or brain. Coumadin can prevent harmful blood clots from forming. As a reminder your indication for Coumadin is:   Stroke Prevention Because Of Atrial Fibrillation  What test will check on my response to Coumadin? While on Coumadin (warfarin) you will need to have an INR test regularly to ensure that your dose is keeping you in the desired range. The INR (international normalized ratio) number is calculated from the result of the laboratory test called prothrombin time (PT).  If an INR APPOINTMENT HAS NOT ALREADY BEEN MADE FOR YOU please schedule an appointment to have this lab work done by your health care provider within 7 days. Your INR goal is usually a number between:  2 to 3 or your provider may give you a more narrow range like 2-2.5.  Ask your health care provider during an office visit what your goal INR is.  What  do you need to  know  About  COUMADIN? Take Coumadin (warfarin) exactly as prescribed by your healthcare provider about the same time each day.  DO NOT stop taking without talking to the doctor who prescribed the medication.  Stopping without other blood clot prevention medication to take the place of Coumadin may increase your risk of developing a new clot or stroke.  Get refills before you run out.  What do you do if you miss a dose? If you miss a dose, take it as soon as you remember on the same day then continue your regularly scheduled regimen the next  day.  Do not take two doses of Coumadin at the same time.  Important Safety Information A possible side effect of Coumadin (Warfarin) is an increased risk of bleeding. You should call your healthcare provider right away if you experience any of the following: ? Bleeding from an injury or your nose that does not stop. ? Unusual colored urine (red or dark brown) or unusual colored stools (red or black). ? Unusual bruising for unknown reasons. ? A serious fall or if you hit your head (even if there is no bleeding).  Some foods or medicines interact with Coumadin (warfarin) and might alter your response to warfarin. To help avoid this: ? Eat a balanced diet, maintaining a consistent amount of Vitamin K. ? Notify your provider about major diet changes you plan to make. ? Avoid alcohol or limit your intake to 1 drink for women and 2 drinks for men per day. (1 drink is 5 oz. wine, 12 oz. beer, or 1.5 oz. liquor.)  Make sure that ANY health care provider who prescribes medication for you knows that you are taking Coumadin (warfarin).  Also make sure the healthcare provider who is monitoring your Coumadin knows when you have started a new medication including herbals and non-prescription products.  Coumadin (Warfarin)  Major Drug Interactions  Increased Warfarin Effect Decreased Warfarin Effect  Alcohol (large quantities) Antibiotics (esp. Septra/Bactrim, Flagyl, Cipro) Amiodarone (Cordarone) Aspirin (ASA) Cimetidine (Tagamet) Megestrol (Megace) NSAIDs (ibuprofen,  naproxen, etc.) °Piroxicam (Feldene) °Propafenone (Rythmol SR) °Propranolol (Inderal) °Isoniazid (INH) °Posaconazole (Noxafil) Barbiturates (Phenobarbital) °Carbamazepine (Tegretol) °Chlordiazepoxide (Librium) °Cholestyramine (Questran) °Griseofulvin °Oral Contraceptives °Rifampin °Sucralfate (Carafate) °Vitamin K  ° °Coumadin® (Warfarin) Major Herbal Interactions  °Increased Warfarin Effect Decreased Warfarin Effect   °Garlic °Ginseng °Ginkgo biloba Coenzyme Q10 °Green tea °St. John’s wort   ° °Coumadin® (Warfarin) FOOD Interactions  °Eat a consistent number of servings per week of foods HIGH in Vitamin K °(1 serving = ½ cup)  °Collards (cooked, or boiled & drained) °Kale (cooked, or boiled & drained) °Mustard greens (cooked, or boiled & drained) °Parsley *serving size only = ¼ cup °Spinach (cooked, or boiled & drained) °Swiss chard (cooked, or boiled & drained) °Turnip greens (cooked, or boiled & drained)  °Eat a consistent number of servings per week of foods MEDIUM-HIGH in Vitamin K °(1 serving = 1 cup)  °Asparagus (cooked, or boiled & drained) °Broccoli (cooked, boiled & drained, or raw & chopped) °Brussel sprouts (cooked, or boiled & drained) *serving size only = ½ cup °Lettuce, raw (green leaf, endive, romaine) °Spinach, raw °Turnip greens, raw & chopped  ° °These websites have more information on Coumadin (warfarin):  www.coumadin.com; °www.ahrq.gov/consumer/coumadin.htm; ° ° ° °

## 2018-10-17 NOTE — Progress Notes (Signed)
PT Cancellation Note  Patient Details Name: BARBARAJO JAGGER MRN: [id number] DOB: [birth year]   Cancelled Treatment:    Reason Eval/Treat Not Completed: Patient at procedure or test/unavailable(TEE)   Sandy Salaam Faryal Marxen 10/17/2018, 10:41 AM  Elwyn Reach, PT Acute [facility] Services Pager: [phone] Office: [phone]

## 2018-10-18 ENCOUNTER — Encounter (HOSPITAL_COMMUNITY): Payer: Self-pay | Admitting: Cardiology

## 2018-10-18 ENCOUNTER — Inpatient Hospital Stay (HOSPITAL_COMMUNITY)
Admission: EM | Disposition: A | Discharge status: Home / Self Care | Payer: Self-pay | Source: Home / Self Care | Attending: Internal Medicine

## 2018-10-18 DIAGNOSIS — E876 Hypokalemia: Secondary | ICD-10-CM

## 2018-10-18 DIAGNOSIS — I10 Essential (primary) hypertension: Secondary | ICD-10-CM

## 2018-10-18 DIAGNOSIS — I4891 Unspecified atrial fibrillation: Secondary | ICD-10-CM

## 2018-10-18 DIAGNOSIS — F419 Anxiety disorder, unspecified: Secondary | ICD-10-CM

## 2018-10-18 DIAGNOSIS — I342 Nonrheumatic mitral (valve) stenosis: Secondary | ICD-10-CM

## 2018-10-18 HISTORY — PX: RIGHT/LEFT HEART CATH AND CORONARY ANGIOGRAPHY: CATH118266

## 2018-10-18 LAB — POCT I-STAT EG7
Acid-Base Excess: 2 mmol/L (ref 0.0–2.0)
Acid-Base Excess: 1 mmol/L (ref 0.0–2.0)
Bicarbonate: 29 mmol/L — ABNORMAL HIGH (ref 20.0–28.0)
Bicarbonate: 28.7 mmol/L — ABNORMAL HIGH (ref 20.0–28.0)
Calcium, Ion: 1.25 mmol/L (ref 1.15–1.40)
Calcium, Ion: 1.22 mmol/L (ref 1.15–1.40)
HCT: 37 % (ref 36.0–46.0)
HCT: 36 % (ref 36.0–46.0)
Hemoglobin: 12.6 g/dL (ref 12.0–15.0)
Hemoglobin: 12.2 g/dL (ref 12.0–15.0)
O2 Saturation: 79 %
O2 Saturation: 81 %
Potassium: 4 mmol/L (ref 3.5–5.1)
Potassium: 3.9 mmol/L (ref 3.5–5.1)
Sodium: 141 mmol/L (ref 135–145)
Sodium: 142 mmol/L (ref 135–145)
TCO2: 31 mmol/L (ref 22–32)
TCO2: 30 mmol/L (ref 22–32)
pCO2, Ven: 56.4 mmHg (ref 44.0–60.0)
pCO2, Ven: 56.2 mmHg (ref 44.0–60.0)
pH, Ven: 7.319 (ref 7.250–7.430)
pH, Ven: 7.316 (ref 7.250–7.430)
pO2, Ven: 48 mmHg — ABNORMAL HIGH (ref 32.0–45.0)
pO2, Ven: 51 mmHg — ABNORMAL HIGH (ref 32.0–45.0)

## 2018-10-18 LAB — POCT I-STAT 7, (LYTES, BLD GAS, ICA,H+H)
Acid-Base Excess: 1 mmol/L (ref 0.0–2.0)
Bicarbonate: 27.8 mmol/L (ref 20.0–28.0)
Calcium, Ion: 1.25 mmol/L (ref 1.15–1.40)
HCT: 37 % (ref 36.0–46.0)
Hemoglobin: 12.6 g/dL (ref 12.0–15.0)
O2 Saturation: 99 %
Potassium: 4 mmol/L (ref 3.5–5.1)
Sodium: 141 mmol/L (ref 135–145)
TCO2: 29 mmol/L (ref 22–32)
pCO2 arterial: 53 mmHg — ABNORMAL HIGH (ref 32.0–48.0)
pH, Arterial: 7.328 — ABNORMAL LOW (ref 7.350–7.450)
pO2, Arterial: 131 mmHg — ABNORMAL HIGH (ref 83.0–108.0)

## 2018-10-18 LAB — BASIC METABOLIC PANEL
Anion gap: 8 (ref 5–15)
Anion gap: 7 (ref 5–15)
BUN: 9 mg/dL (ref 8–23)
BUN: 7 mg/dL — ABNORMAL LOW (ref 8–23)
CO2: 27 mmol/L (ref 22–32)
CO2: 27 mmol/L (ref 22–32)
Calcium: 8.5 mg/dL — ABNORMAL LOW (ref 8.9–10.3)
Calcium: 8.3 mg/dL — ABNORMAL LOW (ref 8.9–10.3)
Chloride: 101 mmol/L (ref 98–111)
Chloride: 105 mmol/L (ref 98–111)
Creatinine, Ser: 0.82 mg/dL (ref 0.44–1.00)
Creatinine, Ser: 0.7 mg/dL (ref 0.44–1.00)
GFR calc Af Amer: >60 mL/min (ref >60)
GFR calc Af Amer: >60 mL/min (ref >60)
GFR calc non Af Amer: >60 mL/min (ref >60)
GFR calc non Af Amer: >60 mL/min (ref >60)
Glucose, Bld: 110 mg/dL — ABNORMAL HIGH (ref 70–99)
Glucose, Bld: 115 mg/dL — ABNORMAL HIGH (ref 70–99)
Potassium: 3.7 mmol/L (ref 3.5–5.1)
Potassium: 3.5 mmol/L (ref 3.5–5.1)
Sodium: 136 mmol/L (ref 135–145)
Sodium: 139 mmol/L (ref 135–145)

## 2018-10-18 LAB — CBC
HCT: 37.1 % (ref 36.0–46.0)
Hemoglobin: 12.7 g/dL (ref 12.0–15.0)
MCH: 32.9 pg (ref 26.0–34.0)
MCHC: 34.2 g/dL (ref 30.0–36.0)
MCV: 96.1 fL (ref 80.0–100.0)
Platelets: 176 10*3/uL (ref 150–400)
RBC: 3.86 MIL/uL — ABNORMAL LOW (ref 3.87–5.11)
RDW: 13.5 % (ref 11.5–15.5)
WBC: 6.4 10*3/uL (ref 4.0–10.5)
nRBC: 0 % (ref 0.0–0.2)

## 2018-10-18 LAB — HEPARIN LEVEL (UNFRACTIONATED): Heparin Unfractionated: 1.03 IU/mL — ABNORMAL HIGH (ref 0.30–0.70)

## 2018-10-18 LAB — GLUCOSE, CAPILLARY: Glucose-Capillary: 113 mg/dL — ABNORMAL HIGH (ref 70–99)

## 2018-10-18 LAB — POCT ACTIVATED CLOTTING TIME: Activated Clotting Time: 131 seconds

## 2018-10-18 LAB — APTT: aPTT: 87 seconds — ABNORMAL HIGH (ref 24–36)

## 2018-10-18 SURGERY — RIGHT/LEFT HEART CATH AND CORONARY ANGIOGRAPHY
Anesthesia: LOCAL

## 2018-10-18 MED ORDER — SODIUM CHLORIDE 0.9% FLUSH
3.0000 mL | Freq: Two times a day (BID) | INTRAVENOUS | Status: DC
  Administered 2018-10-18 – 2018-10-19 (×4): 3 mL via INTRAVENOUS

## 2018-10-18 MED ORDER — SODIUM CHLORIDE 0.9% FLUSH
3.0000 mL | Freq: Two times a day (BID) | INTRAVENOUS | Status: DC
  Administered 2018-10-18: 3 mL via INTRAVENOUS

## 2018-10-18 MED ORDER — MIDAZOLAM HCL 2 MG/2ML IJ SOLN
INTRAMUSCULAR | Status: DC | PRN
  Administered 2018-10-18: 1 mg via INTRAVENOUS

## 2018-10-18 MED ORDER — HYDRALAZINE HCL 20 MG/ML IJ SOLN: 10.0000 mg | INTRAMUSCULAR | Status: AC | PRN

## 2018-10-18 MED ORDER — LIDOCAINE HCL (PF) 1 % IJ SOLN
INTRAMUSCULAR | Status: DC | PRN
  Administered 2018-10-18 (×2): 2 mL via SUBCUTANEOUS
  Administered 2018-10-18: 15 mL via SUBCUTANEOUS

## 2018-10-18 MED ORDER — IOHEXOL 350 MG/ML SOLN
INTRAVENOUS | Status: DC | PRN
  Administered 2018-10-18: 40 mL

## 2018-10-18 MED ORDER — VERAPAMIL HCL 2.5 MG/ML IV SOLN
INTRAVENOUS | Status: DC | PRN
  Administered 2018-10-18: 10 mL via INTRA_ARTERIAL

## 2018-10-18 MED ORDER — FENTANYL CITRATE (PF) 100 MCG/2ML IJ SOLN
INTRAMUSCULAR | Status: AC
  Filled 2018-10-18: qty 2

## 2018-10-18 MED ORDER — VERAPAMIL HCL 2.5 MG/ML IV SOLN
INTRAVENOUS | Status: AC
  Filled 2018-10-18: qty 2

## 2018-10-18 MED ORDER — HEPARIN (PORCINE) IN NACL 1000-0.9 UT/500ML-% IV SOLN
INTRAVENOUS | Status: AC
  Filled 2018-10-18: qty 1000

## 2018-10-18 MED ORDER — MIDAZOLAM HCL 2 MG/2ML IJ SOLN
INTRAMUSCULAR | Status: AC
  Filled 2018-10-18: qty 2

## 2018-10-18 MED ORDER — SODIUM CHLORIDE 0.9% FLUSH: 3.0000 mL | INTRAVENOUS | Status: DC | PRN

## 2018-10-18 MED ORDER — HEPARIN SODIUM (PORCINE) 1000 UNIT/ML IJ SOLN
INTRAMUSCULAR | Status: AC
  Filled 2018-10-18: qty 1

## 2018-10-18 MED ORDER — SODIUM CHLORIDE 0.9 % IV SOLN: 250.0000 mL | INTRAVENOUS | Status: DC | PRN

## 2018-10-18 MED ORDER — HEPARIN (PORCINE) 25000 UT/250ML-% IV SOLN
850.0000 [IU]/h | INTRAVENOUS | Status: DC
  Administered 2018-10-18: 750 [IU]/h via INTRAVENOUS
  Filled 2018-10-18: qty 250

## 2018-10-18 MED ORDER — ONDANSETRON HCL 4 MG/2ML IJ SOLN
4.0000 mg | Freq: Four times a day (QID) | INTRAMUSCULAR | Status: DC | PRN
  Administered 2018-10-18: 4 mg via INTRAVENOUS
  Filled 2018-10-18: qty 2

## 2018-10-18 MED ORDER — SODIUM CHLORIDE 0.9 % IV SOLN
INTRAVENOUS | Status: AC
  Administered 2018-10-18: 12:00:00 via INTRAVENOUS

## 2018-10-18 MED ORDER — VERAPAMIL HCL 2.5 MG/ML IV SOLN
INTRAVENOUS | Status: DC | PRN
  Administered 2018-10-18: 11:00:00 10 mL via INTRA_ARTERIAL

## 2018-10-18 MED ORDER — FENTANYL CITRATE (PF) 100 MCG/2ML IJ SOLN
INTRAMUSCULAR | Status: DC | PRN
  Administered 2018-10-18: 25 ug via INTRAVENOUS
  Administered 2018-10-18: 50 ug via INTRAVENOUS

## 2018-10-18 MED ORDER — LIDOCAINE HCL (PF) 1 % IJ SOLN
INTRAMUSCULAR | Status: AC
  Filled 2018-10-18: qty 30

## 2018-10-18 SURGICAL SUPPLY — 17 items
CATH BALLN WEDGE 5F 110CM (CATHETERS) ×2 IMPLANT
CATH INFINITI MULTIPACK ANG 4F (CATHETERS) ×2 IMPLANT
CATH OPTITORQUE TIG 4.0 5F (CATHETERS) ×2 IMPLANT
DEVICE RAD COMP TR BAND LRG (VASCULAR PRODUCTS) ×2 IMPLANT
GLIDESHEATH SLEND SS 6F .021 (SHEATH) ×2 IMPLANT
GUIDEWIRE ANGLED .035X260CM (WIRE) ×2 IMPLANT
GUIDEWIRE INQWIRE 1.5J.035X260 (WIRE) ×1 IMPLANT
INQWIRE 1.5J .035X260CM (WIRE) ×2
KIT HEART LEFT (KITS) ×2 IMPLANT
PACK CARDIAC CATHETERIZATION (CUSTOM PROCEDURE TRAY) ×2 IMPLANT
SHEATH GLIDE SLENDER 4/5FR (SHEATH) ×2 IMPLANT
SHEATH PINNACLE 4F 10CM (SHEATH) ×2 IMPLANT
SHEATH PROBE COVER 6X72 (BAG) ×2 IMPLANT
TRANSDUCER W/STOPCOCK (MISCELLANEOUS) ×2 IMPLANT
TUBING CIL FLEX 10 FLL-RA (TUBING) ×2 IMPLANT
WIRE EMERALD 3MM-J .035X150CM (WIRE) ×2 IMPLANT
WIRE HI TORQ VERSACORE-J 145CM (WIRE) ×2 IMPLANT

## 2018-10-18 NOTE — Consult Note (Addendum)
[address]       [place] [id number]             [phone]        Daysy S Galea [facility] Medical Record [id number] Date of Birth: 06-07-1945   Referring: Ellyn Hack Primary Care: Lawerance Cruel, MD Primary Cardiologist:Tiffany Oval Linsey, MD  Chief Complaint:    Chief Complaint  Patient presents with   Loss of Consciousness   History of Present Illness:      Mary Farrell is a 73 yo white female with known history of HTN and Atrial Fibrillation.  She states that her Atrial Fibrillation dates back awhile ago.  She states that initially her Atrial Fibrillation was persistent.  She underwent Cardioversion 3-4 times.  However, in February of 2019 she underwent Ablation which ultimately had to be aborted.  Ablation was again attempted in May 2019 which was successful in converting her to NSR.    Unfortunately she developed brief episodes of Atrial Fibrillation a few weeks ago.  She has been on Eliquis with last dose being taken on Monday.  The patient states that she had been otherwise in her normal state of health until a several months ago.  She stated she developed episodes of flushing, dizziness, and tinnitus.  She states the tinnitus would resolve if she stood still for a few minutes or put her head into her hand.  She had a virtual visit a few months ago, but workup was a little delayed due to Covid-19.  She states this past Sunday she again developed an episode of dizziness, flushing, and tinnitus.  She again put her head in her hands, and she said next thing she knew she was on the ground, not remembering what happened.  She presented to the ED for evaluation.  EKG obtained showed Sinus Bradycardia with 1st degree AV block, incomplete right bundle branch block.  CT scan of the head was negative.  Cardiology evaluation was requested who felt patient should be admitted for further workup of syncope.  Echocardiogram was obtained and showed a preserved EF, there was  moderate to severe mitral stenosis with rheumatic appearing mitral valve.  There was mild aortic regurgitation, with normal aortic valve.  Further workup with TEE was obtained and confirmed the presence of mitral valve stenosis.  Cardiac catheterization was also performed and showed no evidence of CAD.  Cardiothoracic consultation is being requested for surgical intervention.  Currently the patient is symptoms free.  She states she is surprised with everything going on.  She states she was told back in the 70s that she had Mitral Valve prolapse.  She states she had been routinely monitored since that time.  She does not smoke and has never.  She has her own teeth with a partial, and her last dentist appointment was more than 6 months ago due to [place].  She states she remembers being sick as a child before school starting and assumes it was rheumatic fever, but no one told her she had it.  She is active and has no limitations in her daily activity, however she does notice she becomes more fatigued during her walks quicker than she used to.  She wishes to go home prior to proceeding with surgery if possible.      Current Activity/ Functional Status: Patient is independent with mobility/ambulation, transfers, ADL's, IADL's.   Zubrod Score: At the time of surgery this patients most appropriate activity status/level should be described  as: [x]     0    Normal activity, no symptoms []     1    Restricted in physical strenuous activity but ambulatory, able to do out light work []     2    Ambulatory and capable of self care, unable to do work activities, up and about                 more than 50%  Of the time                            []     3    Only limited self care, in bed greater than 50% of waking hours []     4    Completely disabled, no self care, confined to bed or chair []     5    Moribund  Past Medical History:  Diagnosis Date   Anxiety 06/29/2012   Aortic regurgitation 08/13/2015   Arthritis     "hands, wrists, back, feet, toes" (06/24/2017)   Atrial flutter ([place]) 09/18/2015   Atypical chest pain 05/13/2016   Chest pain    remote cath in 1996 with NORMAL coronaries noted   CHF (congestive heart failure) ([place])    Dyspnea 10/30/2010   Essential hypertension 09/05/2013   Exogenous obesity    history of    GERD (gastroesophageal reflux disease)    Glaucoma, both eyes    Headache, migraine    "stopped in my 23's" (09/18/2015)   History of cardiovascular stress test 2007   showing no ischemia   Hypertension    Mitral stenosis and aortic insufficiency 06/23/2010   Mitral stenosis with insufficiency    Murmur    of mitral stenosis and moderate aortic insufficiency       Nausea 05/01/2018   OSA on CPAP    Palpitations    occasional   Paroxysmal A-fib ([facility]) 06/24/2017   Pericardial effusion 09/18/2015   Persistent atrial fibrillation 03/10/2017   Personal history of rheumatic heart disease    SBE (subacute bacterial endocarditis)    prophylaxis   Sliding hiatal hernia    Vertigo 05/01/2018    Past Surgical History:  Procedure Laterality Date   ABDOMINAL HYSTERECTOMY  1975   APPENDECTOMY  1977   ATRIAL FIBRILLATION ABLATION N/A 03/10/2017   Procedure: ATRIAL FIBRILLATION ABLATION;  Surgeon: [name], MD;  Location: [facility];  Service: Cardiovascular;  Laterality: N/A;   ATRIAL FIBRILLATION ABLATION  06/24/2017   ATRIAL FIBRILLATION ABLATION N/A 06/24/2017   Procedure: ATRIAL FIBRILLATION ABLATION;  Surgeon: [name], MD;  Location: [facility];  Service: Cardiovascular;  Laterality: N/A;   BUNIONECTOMY Right 2000s   CARDIAC CATHETERIZATION  01/13/1995   normal coronary anatomy and mild mitral stenosis and mild pulmonary hypertension   CARDIOVERSION N/A 09/22/2015   Procedure: CARDIOVERSION;  Surgeon: Jerline Pain, MD;  Location: [facility] ENDOSCOPY;  Service: Cardiovascular;  Laterality: N/A;   CARDIOVERSION N/A  10/25/2016   Procedure: CARDIOVERSION;  Surgeon: Fay Records, MD;  Location: [facility];  Service: Cardiovascular;  Laterality: N/A;   CARDIOVERSION N/A 04/15/2017   Procedure: CARDIOVERSION;  Surgeon: Josue Hector, MD;  Location: [facility] ENDOSCOPY;  Service: Cardiovascular;  Laterality: N/A;   CARDIOVERSION N/A 05/16/2017   Procedure: CARDIOVERSION;  Surgeon: Pixie Casino, MD;  Location: [facility] ENDOSCOPY;  Service: Cardiovascular;  Laterality: N/A;   CATARACT EXTRACTION W/ INTRAOCULAR LENS  IMPLANT, BILATERAL Bilateral 2013  LAPAROSCOPIC CHOLECYSTECTOMY  1997   RIGHT/LEFT HEART CATH AND CORONARY ANGIOGRAPHY N/A 10/18/2018   Procedure: RIGHT/LEFT HEART CATH AND CORONARY ANGIOGRAPHY;  Surgeon: Leonie Man, MD;  Location: [facility];  Service: Cardiovascular;  Laterality: N/A;   TEE WITHOUT CARDIOVERSION N/A 06/23/2017   Procedure: TRANSESOPHAGEAL ECHOCARDIOGRAM (TEE);  Surgeon: Fay Records, MD;  Location: [facility] ENDOSCOPY;  Service: Cardiovascular;  Laterality: N/A;   TONSILLECTOMY AND ADENOIDECTOMY  1990s   UVULOPALATOPHARYNGOPLASTY, TONSILLECTOMY AND SEPTOPLASTY  1990s    Social History   Tobacco Use  Smoking Status Never Smoker  Smokeless Tobacco Never Used    Social History   Substance and Sexual Activity  Alcohol Use No     Allergies  Allergen Reactions   Amoxicillin Diarrhea    Has patient had a PCN reaction causing immediate rash, facial/tongue/throat swelling, SOB or lightheadedness with hypotension: no Has patient had a PCN reaction causing severe rash involving mucus membranes or skin necrosis: no Has patient had a PCN reaction that required hospitalization: no Has patient had a PCN reaction occurring within the last 10 years: no If all of the above answers are "NO", then may proceed with Cephalosporin use.    Metoprolol Other (See Comments)    Mouth tastes like mold   Metoprolol Tartrate     Other reaction(s): Other (See Comments) Mouth sores    Oxaprozin Nausea Only    Current Facility-Administered Medications  Medication Dose Route Frequency Provider Last Rate Last Dose   0.9 %  sodium chloride infusion  250 mL Intravenous PRN Leonie Man, MD       0.9 %  sodium chloride infusion   Intravenous Continuous Leonie Man, MD 75 mL/hr at 10/18/18 1225     0.9 %  sodium chloride infusion  250 mL Intravenous PRN Leonie Man, MD       acetaminophen (TYLENOL) tablet 500 mg  500 mg Oral Q6H PRN Leonie Man, MD       ALPRAZolam Duanne Moron) tablet 0.125 mg  0.125 mg Oral BID PRN Leonie Man, MD   0.125 mg at 10/18/18 0524   carvedilol (COREG) tablet 6.25 mg  6.25 mg Oral BID WC Leonie Man, MD   6.25 mg at 10/18/18 0934   doxepin (SINEQUAN) capsule 10-20 mg  10-20 mg Oral QHS PRN Leonie Man, MD   10 mg at 10/17/18 2219   famotidine (PEPCID) tablet 20 mg  20 mg Oral QHS Leonie Man, MD   20 mg at 10/17/18 2153   flecainide (TAMBOCOR) tablet 100 mg  100 mg Oral BID Leonie Man, MD   100 mg at 10/18/18 0935   gabapentin (NEURONTIN) capsule 300 mg  300 mg Oral TID Leonie Man, MD   300 mg at 10/18/18 0934   heparin ADULT infusion 100 units/mL (25000 units/233mL sodium chloride 0.45%)  750 Units/hr Intravenous Continuous [place] Donnamarie Poag, DO       hydrALAZINE (APRESOLINE) injection 10 mg  10 mg Intravenous Q20 Min PRN Leonie Man, MD       hydrochlorothiazide (MICROZIDE) capsule 12.5 mg  12.5 mg Oral Daily Leonie Man, MD   12.5 mg at 10/18/18 0935   loratadine (CLARITIN) tablet 10 mg  10 mg Oral Daily Leonie Man, MD   10 mg at 10/17/18 1012   losartan (COZAAR) tablet 50 mg  50 mg Oral Daily Leonie Man, MD   50 mg at 10/18/18 [phone]  ondansetron (ZOFRAN) injection 4 mg  4 mg Intravenous Q6H PRN Leonie Man, MD   4 mg at 10/18/18 0211   oxybutynin (DITROPAN-XL) 24 hr tablet 10 mg  10 mg Oral Daily Leonie Man, MD   10 mg at 10/18/18 0934   pantoprazole  (PROTONIX) EC tablet 40 mg  40 mg Oral Daily Leonie Man, MD   40 mg at 10/18/18 0934   polyethylene glycol (MIRALAX / GLYCOLAX) packet 17 g  17 g Oral Daily PRN Leonie Man, MD       sodium chloride flush (NS) 0.9 % injection 3 mL  3 mL Intravenous Once Leonie Man, MD       sodium chloride flush (NS) 0.9 % injection 3 mL  3 mL Intravenous Q12H Leonie Man, MD   3 mL at 10/16/18 2110   sodium chloride flush (NS) 0.9 % injection 3 mL  3 mL Intravenous Q12H Leonie Man, MD   3 mL at 10/18/18 0939   sodium chloride flush (NS) 0.9 % injection 3 mL  3 mL Intravenous PRN Leonie Man, MD       sodium chloride flush (NS) 0.9 % injection 3 mL  3 mL Intravenous Q12H Leonie Man, MD   3 mL at 10/18/18 0938   sodium chloride flush (NS) 0.9 % injection 3 mL  3 mL Intravenous Q12H Leonie Man, MD       sodium chloride flush (NS) 0.9 % injection 3 mL  3 mL Intravenous PRN Leonie Man, MD       traMADol Veatrice Bourbon) tablet 50 mg  50 mg Oral Q6H PRN Leonie Man, MD        Medications Prior to Admission  Medication Sig Dispense Refill Last Dose   acetaminophen (TYLENOL) 500 MG tablet Take 500 mg by mouth every 6 (six) hours as needed for moderate pain or headache.   Past Month at Unknown time   ALPRAZolam (XANAX) 0.25 MG tablet Take 0.125 mg by mouth 2 (two) times daily as needed for anxiety.    10/14/2018 at Unknown time   carvedilol (COREG) 12.5 MG tablet Take 1 tablet (12.5 mg total) by mouth 2 (two) times daily with a meal. 180 tablet 3 10/15/2018 at 0830   cetirizine (ZYRTEC) 10 MG tablet Take 10 mg by mouth at bedtime.    10/14/2018 at Unknown time   Cholecalciferol (VITAMIN D) 2000 UNITS CAPS Take 2,000 Units by mouth daily.     10/15/2018 at Unknown time   conjugated estrogens (PREMARIN) vaginal cream Place 1 Applicatorful vaginally daily as needed (irritation).    10/14/2018 at Unknown time   dorzolamide-timolol (COSOPT) 22.3-6.8 MG/ML ophthalmic  solution Place 1 drop into both eyes daily.   10/15/2018 at Unknown time   doxepin (SINEQUAN) 10 MG capsule Take 10-20 mg by mouth at bedtime as needed (sleep).    10/14/2018 at Unknown time   ELIQUIS 5 MG TABS tablet TAKE 1 TABLET BY MOUTH TWO  TIMES DAILY (Patient taking differently: Take 5 mg by mouth 2 (two) times daily. ) 180 tablet 1 10/15/2018 at 0830   famotidine (PEPCID) 20 MG tablet Take 20 mg by mouth at bedtime.   10/14/2018 at Unknown time   flecainide (TAMBOCOR) 50 MG tablet TAKE 2 TABLETS BY MOUTH TWO TIMES DAILY (Patient taking differently: Take 100 mg by mouth 2 (two) times daily. ) 360 tablet 3 10/15/2018 at Unknown time   gabapentin (NEURONTIN) 300 MG  capsule Take 300 mg by mouth 3 (three) times daily.    10/15/2018 at Unknown time   hydrochlorothiazide (MICROZIDE) 12.5 MG capsule TAKE 1 CAPSULE BY MOUTH  DAILY (Patient taking differently: Take 12.5 mg by mouth daily. ) 90 capsule 3 10/15/2018 at Unknown time   lactobacillus acidophilus (BACID) TABS tablet Take 1 tablet by mouth daily.    10/15/2018 at Unknown time   latanoprost (XALATAN) 0.005 % ophthalmic solution Place 1 drop into both eyes at bedtime.   6 10/14/2018 at Unknown time   losartan (COZAAR) 50 MG tablet Take 50 mg by mouth daily.   10/15/2018 at Unknown time   Multiple Vitamin (MULTIVITAMIN WITH MINERALS) TABS tablet Take 1 tablet by mouth daily.   10/15/2018 at Unknown time   omeprazole (PRILOSEC) 40 MG capsule Take 40 mg by mouth daily.    10/15/2018 at Unknown time   oxybutynin (DITROPAN-XL) 10 MG 24 hr tablet Take 10 mg by mouth daily.    10/15/2018 at Unknown time   polyethylene glycol (MIRALAX / GLYCOLAX) 17 g packet Take 17 g by mouth See admin instructions. 3-4 times a week   Past Week at Unknown time   PRESCRIPTION MEDICATION Inhale into the lungs at bedtime. CPAP   10/14/2018 at Unknown time   NON FORMULARY Apply 1 application topically 2 (two) times daily as needed (for eczema). Traimcinolone/[facility] Moist  Cream       Family History  Problem Relation Age of Onset   Heart attack Mother    Hypertension Brother    Kidney disease Brother    Diabetes Brother    Heart failure Maternal Grandfather    Review of Systems:   ROS Constitutional: positive for syncope on admission Eyes: negative Respiratory: negative for dyspnea on exertion and pleurisy/chest pain Cardiovascular: positive for palpitations and syncope Gastrointestinal: negative Neurological: positive for dizziness     Cardiac Review of Systems: Y or  [    ]= no  Chest Pain [ N   ]  Resting SOB [ N  ] Exertional SOB  [  ]  Orthopnea [  ]   Pedal Edema [ N  ]    Palpitations [ Y ] Syncope  [  ]   Presyncope [   ]  General Review of Systems: [Y] = yes [  ]=no Constitional: recent weight change [  ]; anorexia [  ]; fatigue [ Y ]; nausea [  ]; night sweats [  ]; fever [ N; or chills [  ]                                                               Dental: Last Dentist visit: > 6 months due to COVID  Eye : blurred vision [  ]; diplopia [   ]; vision changes [  ];  Amaurosis fugax[  ]; Resp: cough Aqua.Slicker  ];  wheezing[ N ];  hemoptysis[  ]; shortness of breath[  ]; paroxysmal nocturnal dyspnea[  ]; dyspnea on exertion[  ]; or orthopnea[  ];  GI:  gallstones[  ], vomiting[ N ];  dysphagia[  ]; melena[  ];  hematochezia [  ]; heartburn[  ];   Hx of  Colonoscopy[  ]; GU: kidney stones [  ]; hematuria[  ];  dysuria [  ];  nocturia[  ];  history of     obstruction [  ]; urinary frequency [  ]             Skin: rash, swelling[ N ];, hair loss[  ];  peripheral edema[N  ];  or itching[  ]; Musculosketetal: myalgias[  ];  joint swelling[  ];  joint erythema[  ];  joint pain[  ];  back pain[  ];  Heme/Lymph: bruising[  ];  bleeding[  ];  anemia[  ];  Neuro: TIA[  ];  headaches[  ];  stroke[  ];  vertigo[  ];  seizures[  ];   paresthesias[  ];  difficulty walking[  ];  Psych:depression[  ]; anxiety[  ];  Endocrine: diabetes[  ];  thyroid  dysfunction[  ];  Physical Exam: BP (!) 111/50 (BP Location: Left Arm)    Pulse (!) 58    Temp 98.3 F (36.8 C) (Oral)    Resp 16    Ht 5' (1.524 m)    Wt 69.7 kg    SpO2 98%    BMI 30.00 kg/m    General appearance: alert, cooperative and no distress Head: Normocephalic, without obvious abnormality, atraumatic Neck: no adenopathy, no carotid bruit, no JVD, supple, symmetrical, trachea midline and thyroid not enlarged, symmetric, no tenderness/mass/nodules Resp: clear to auscultation bilaterally Cardio: regular rate and rhythm and diastolic murmur GI: soft, non-tender; bowel sounds normal; no masses,  no organomegaly Extremities: extremities normal, atraumatic, no cyanosis or edema Neurologic: Grossly normal  Diagnostic Studies & Laboratory data:     Recent Radiology Findings:   No results found.   I have independently reviewed the above radiologic studies and discussed with the patient   Recent Lab Findings: Lab Results  Component Value Date   WBC 6.4 10/18/2018   HGB 12.7 10/18/2018   HCT 37.1 10/18/2018   PLT 176 10/18/2018   GLUCOSE 110 (H) 10/18/2018   ALT 19 10/15/2018   AST 21 10/15/2018   NA 136 10/18/2018   K 3.7 10/18/2018   CL 101 10/18/2018   CREATININE 0.82 10/18/2018   BUN 9 10/18/2018   CO2 27 10/18/2018   TSH 1.560 09/18/2015   INR 1.04 09/18/2015    Assessment / Plan:    1. Mitral Stenosis- moderate to severe, patient presents with syncopal episode, felt to be rheumatic- patient will likely require surgical replacement  2. Atrial fibrillation- multiple cardioversions, ablation x 2-- maintaining NSR until recently when she is now back in PAF.Marland Kitchen last dose of Eliquis was Monday... patient may also benefit from MAZE procedure with clipping of LA Appendage 3. Dental clearance- last visit > 6 months ago, visit delayed due to [place], they are going to contact Dentist and if okay will have letter faxed to our office 4. Dr. Roxy Manns is aware of patient and will  follow up with further recommendations  I  spent 55 minutes counseling the patient face to face.   Ellwood Handler, PA-C 10/18/2018 2:53 PM   I have seen and examined the patient and agree with the assessment as outlined above by Ellwood Handler, PA-C.  Patient is a 73 year old female with likely rheumatic heart disease including mitral stenosis, aortic insufficiency, and recurrent paroxysmal atrial fibrillation and atrial flutter who describes a long history of symptoms consistent with chronic diastolic congestive heart failure and was hospitalized following a brief syncopal event who now has been referred for surgical consultation to discuss treatment options for management of  severe symptomatic mitral stenosis and recurrent paroxysmal atrial fibrillation.  Patient states that she was told she had a heart murmur many years ago.  She denies any known history of rheumatic fever, although she states that she was very ill during her childhood for an extended period of time and was never given a specific diagnosis.  At some point in the past she was told that she had mitral valve prolapse.  For years she was followed by Dr. Mare Ferrari and catheterization in 1996 revealed normal coronary arteries with mild mitral stenosis and mild pulmonary hypertension.  More recently she has been followed by Dr. Oval Linsey.  In 2017 she was hospitalized with atrial flutter.  Echocardiogram performed at that time revealed moderate mitral stenosis, mild to moderate aortic insufficiency, and moderate to severe pericardial effusion without tamponade.  She was initially started on amiodarone but could not tolerate it due to nausea.  She underwent several cardioversions and eventually was started on flecainide.  Approximately 2 years ago she developed atrial fibrillation.  She was cardioverted back to sinus rhythm but again developed recurrent atrial fibrillation and atrial flutter.  She underwent an attempted A. fib ablation in February  2019 followed by several outpatient cardioversions.  She later completed formal A. fib ablation in May 2019 and had done well and was maintaining sinus rhythm until recently.  Several weeks ago the patient developed recurrent paroxysmal atrial fibrillation, with another episode approximately 1 week later.  She is also had several dizzy spells and was hospitalized 10/15/2018 following a brief syncopal event.  She was somewhat bradycardic but in sinus rhythm at the time of admission.  Transthoracic echocardiogram revealed significant progression and severity of mitral stenosis.  This was confirmed by transesophageal echocardiogram and cardiothoracic surgical consultation was requested.  Left and right heart catheterization was performed earlier today and notable for normal coronary arteries with no significant coronary artery disease.  There was mild pulmonary hypertension.  Patient describes a long history of exertional shortness of breath and fatigue that has progressed over the past year.  She now gets short of breath with moderate and low level activity.  She reports occasional very slight shortness of breath at rest but she denies PND, orthopnea.  She has not had lower extremity edema recently.  She denies any chest pain or chest tightness either with activity or at rest.  I have personally reviewed the patient's transthoracic and transesophageal echocardiograms which demonstrate the presence of rheumatic appearance of mitral valve with severe mitral stenosis and mild mitral regurgitation.  Mean transvalvular gradient across mitral valve was estimated 8 mmHg but the mitral valve area was notably estimated only 1.2 cm with pressure half-time reported 140 ms on transthoracic echocardiogram and 186 ms on transesophageal echocardiogram.  There is at least mild central aortic insufficiency.  There is an eccentric area of severe calcification in the mitral valve which would preclude primary balloon valvuloplasty as  an option for treatment.  Left ventricular size and systolic function remains normal.  There is severe left atrial enlargement.  Right ventricular size and function is normal.  There is mild central tricuspid regurgitation.  Catheterization is notable for the absence of significant coronary artery disease.  There is mild pulmonary hypertension.  I agree the patient would benefit from mitral valve replacement and she would likely additionally benefit from a concomitant Maze procedure.  She would probably be a candidate for minimally invasive approach for surgery.  The patient and her husband counseled at length regarding the  indications, risks and potential benefits of mitral valve replacement.  The rationale for elective surgery has been explained, including a comparison between surgery and continued medical therapy with close follow-up.  The relative risks and benefits of performing a maze procedure at the time of her surgery was discussed at length, including the expected likelihood of long term freedom from recurrent symptomatic atrial fibrillation and/or atrial flutter.  Alternative surgical approaches have been discussed including a comparison between conventional sternotomy and minimally-invasive techniques.  The relative risks and benefits of each have been reviewed as they pertain to the patient's specific circumstances, and expectations for the patient's postoperative convalescence has been discussed.  We discussed the possibility of replacing the mitral valve using a mechanical prosthesis with the attendant need for long-term anticoagulation versus the alternative of replacing it using a bioprosthetic tissue valve with its potential for late structural valve deterioration and failure, depending upon the patient's longevity.     The patient is interested in proceeding with surgery at some point in the not too distant future, but she does not wish to consider surgery during this hospitalization.  She  understands that although the mitral stenosis may contribute to her tendency for orthostatic hypotension, it cannot be thought of as the only explanation for her recent syncopal episode.  At this point the patient desires to go home as soon as practical with plans to contact our office in the near future to schedule follow-up appointment depending upon when she might consider proceeding with elective surgery.  Although her surgical procedure is not urgent, I have suggested that frequency of follow-up echocardiography and office visits should be escalated if she does not proceed with surgery within the next few months.  All of their questions have been answered.   I spent in excess of 60 minutes during the conduct of this hospital encounter and >50% of this time involved direct face-to-face encounter with the patient for counseling and/or coordination of their care.     Rexene Alberts, MD 10/18/2018 5:01 PM

## 2018-10-18 NOTE — Progress Notes (Signed)
Patient received from cath lab, 11cc of air in TR band per report. Site is level zero, R groin site is level zero as well.

## 2018-10-18 NOTE — Progress Notes (Signed)
ANTICOAGULATION CONSULT NOTE - Follow-Up Consult  Pharmacy Consult for : Heparin Indication: atrial fibrillation  Allergies  Allergen Reactions  . Amoxicillin Diarrhea    Has patient had a PCN reaction causing immediate rash, facial/tongue/throat swelling, SOB or lightheadedness with hypotension: no Has patient had a PCN reaction causing severe rash involving mucus membranes or skin necrosis: no Has patient had a PCN reaction that required hospitalization: no Has patient had a PCN reaction occurring within the last 10 years: no If all of the above answers are "NO", then may proceed with Cephalosporin use.   . Metoprolol Other (See Comments)    Mouth tastes like mold  . Metoprolol Tartrate     Other reaction(s): Other (See Comments) Mouth sores  . Oxaprozin Nausea Only    Patient Measurements: Height: 5' (152.4 cm) Weight: 153 lb 9.6 oz (69.7 kg) IBW/kg (Calculated) : 45.5 Heparin Dosing Weight: 60kg  Vital Signs: Temp: 98.3 F (36.8 C) (09/23 0454) Temp Source: Oral (09/23 0454) BP: 124/53 (09/23 1219) Pulse Rate: 58 (09/23 1150)  Labs: Recent Labs    10/15/18 1259 10/15/18 1625 10/16/18 0233 10/17/18 0628 10/17/18 1657 10/18/18 0442  HGB 13.6  --   --  13.1  --  12.7  HCT 42.2  --   --  38.7  --  37.1  PLT 206  --   --  161  --  176  APTT  --   --   --  145* 109* 87*  HEPARINUNFRC  --   --   --  2.12*  --  1.03*  CREATININE 0.88  --  0.81 0.73  --  0.82  TROPONINIHS  --  5  --   --   --   --     Estimated Creatinine Clearance: 52.5 mL/min (by C-G formula based on SCr of 0.82 mg/dL).   Medical History: Past Medical History:  Diagnosis Date  . Anxiety 06/29/2012  . Aortic regurgitation 08/13/2015  . Arthritis    "hands, wrists, back, feet, toes" (06/24/2017)  . Atrial flutter ([place]) 09/18/2015  . Atypical chest pain 05/13/2016  . Chest pain    remote cath in 1996 with NORMAL coronaries noted  . CHF (congestive heart failure) ([place])   . Dyspnea 10/30/2010  .  Essential hypertension 09/05/2013  . Exogenous obesity    history of   . GERD (gastroesophageal reflux disease)   . Glaucoma, both eyes   . Headache, migraine    "stopped in my 50's" (09/18/2015)  . History of cardiovascular stress test 2007   showing no ischemia  . Hypertension   . Mitral stenosis and aortic insufficiency 06/23/2010  . Mitral stenosis with insufficiency   . Murmur    of mitral stenosis and moderate aortic insufficiency      . Nausea 05/01/2018  . OSA on CPAP   . Palpitations    occasional  . Paroxysmal A-fib ([place]) 06/24/2017  . Pericardial effusion 09/18/2015  . Persistent atrial fibrillation 03/10/2017  . Personal history of rheumatic heart disease   . SBE (subacute bacterial endocarditis)    prophylaxis  . Sliding hiatal hernia   . Vertigo 05/01/2018   Assessment: 73 year old female with afib history, on Apixaban PTA. Last apixaban dose 9/21,  transitioned to heparin infusion due to procedures needed.  9/23 AM: aPTT 87 on heparin gtt 750 ut/hr. CBC wnl/stable. Now s/p cardiac cath 9/23 found normal coronaries but severe mitral valve stenosis, plan for MV replacement.  Now with Valvular Afib  w/ mitral valve disease. MD changing from apixaban to heparin bridge with eventual warfarin.  Post cath, pharmacy consulted to restart Heparin 8 hr post sheath removal. Sheath removed 11:49, no bleeding or hematoma noted.   Goal of Therapy:  Heparin level 0.3-0.7 units/ml aPTT 66-102s seconds Monitor platelets by anticoagulation protocol: Yes   Plan:  At 20:00 restart Heparin 8 hr post sheath removal, at rate of 750/hr Check 8 hrs aPTT/HL Daily aPTT, HL, CBC Continue to monitor H&H and platelets   Nicole Cella, RPh Clinical Pharmacist [phone] until 3:30 **Pharmacist phone directory can now be found on [email or web address] ([email or web address]).  Listed under [place]. 10/18/2018 12:22 PM

## 2018-10-18 NOTE — Progress Notes (Signed)
IV team unable to establish a 20 gauge in either [facility] for this morning's cath due to bruising at the [facility] sites from lab draws. 20 gauge placed in the left forearm. Pt with existing right AC 22 gauge. Will continue to monitor.

## 2018-10-18 NOTE — Progress Notes (Signed)
Pt is gone to cath lab and will retry at another time.   10/18/18 1000  PT Visit Information  Last PT Received On 10/18/18  Reason Eval/Treat Not Completed Patient at procedure or test/unavailable    Mee Hives, PT MS Acute Rehab Dept. Number: [place]

## 2018-10-18 NOTE — Interval H&P Note (Signed)
History and Physical Interval Note:  10/18/2018 10:08 AM  Mary Farrell  has presented today for surgery, with the diagnosis of MITRAL STENOSIS.  The various methods of treatment have been discussed with the patient and family. After consideration of risks, benefits and other options for treatment, the patient has consented to  Procedure(s): RIGHT/LEFT HEART CATH AND CORONARY ANGIOGRAPHY (N/A) as a surgical intervention.  The patient's history has been reviewed, patient examined, no change in status, stable for surgery.  I have reviewed the patient's chart and labs.  Questions were answered to the patient's satisfaction.     Glenetta Hew

## 2018-10-18 NOTE — Progress Notes (Addendum)
Progress Note  Patient Name: Mary Farrell Date of Encounter: 10/18/2018  Primary Cardiologist: Skeet Latch, MD   Subjective   Feeling anxious and nervous.  No chest pain, shortness of breath, lightheadedness or dizziness.  Inpatient Medications    Scheduled Meds:  carvedilol  6.25 mg Oral BID WC   famotidine  20 mg Oral QHS   flecainide  100 mg Oral BID   gabapentin  300 mg Oral TID   hydrochlorothiazide  12.5 mg Oral Daily   loratadine  10 mg Oral Daily   losartan  50 mg Oral Daily   oxybutynin  10 mg Oral Daily   pantoprazole  40 mg Oral Daily   sodium chloride flush  3 mL Intravenous Once   sodium chloride flush  3 mL Intravenous Q12H   sodium chloride flush  3 mL Intravenous Q12H   sodium chloride flush  3 mL Intravenous Q12H   Continuous Infusions:  sodium chloride     sodium chloride     sodium chloride 1 mL/kg/hr (10/18/18 0514)   heparin 750 Units/hr (10/18/18 0600)   PRN Meds: sodium chloride, sodium chloride, acetaminophen, ALPRAZolam, doxepin, ondansetron (ZOFRAN) IV, polyethylene glycol, sodium chloride flush, sodium chloride flush, traMADol   Vital Signs    Vitals:   10/18/18 0134 10/18/18 0454 10/18/18 0540 10/18/18 0932  BP: (!) 148/69 (!) 142/59  (!) 156/68  Pulse: 70 66  72  Resp: 16 16  18   Temp: 98.4 F (36.9 C) 98.3 F (36.8 C)    TempSrc: Oral Oral    SpO2: 99% 99%  98%  Weight:   69.7 kg   Height:        Intake/Output Summary (Last 24 hours) at 10/18/2018 0939 Last data filed at 10/18/2018 [id number] Gross per 24 hour  Intake 871.06 ml  Output 1250 ml  Net -378.94 ml   Last 3 Weights 10/18/2018 10/17/2018 10/16/2018  Weight (lbs) 153 lb 9.6 oz 152 lb 11.2 oz 152 lb 9.6 oz  Weight (kg) 69.673 kg 69.264 kg 69.219 kg      Telemetry    Sinus rhythm.  No events. - Personally Reviewed  ECG    N/- Personally Reviewed  Physical Exam   VS:  BP (!) 156/68 (BP Location: Left Arm)    Pulse 72    Temp 98.3 F (36.8 C)  (Oral)    Resp 18    Ht 5' (1.524 m)    Wt 69.7 kg    SpO2 98%    BMI 30.00 kg/m  , BMI Body mass index is 30 kg/m. GENERAL:  Well appearing.  Anxious HEENT: Pupils equal round and reactive, fundi not visualized, oral mucosa unremarkable NECK:  No jugular venous distention, waveform within normal limits, carotid upstroke brisk and symmetric, no bruits LUNGS:  Clear to auscultation bilaterally HEART:  RRR.  PMI not displaced or sustained,S1 and S2 within normal limits, +S3, no S4, no clicks, no rubs,  II/VI diastolic murmur ABD:  Flat, positive bowel sounds normal in frequency in pitch, no bruits, no rebound, no guarding, no midline pulsatile mass, no hepatomegaly, no splenomegaly EXT:  2 plus pulses throughout, no edema, no cyanosis no clubbing SKIN:  No rashes no nodules NEURO:  Cranial nerves II through XII grossly intact, motor grossly intact throughout PSYCH:  Cognitively intact, oriented to person place and time   Labs    High Sensitivity Troponin:   Recent Labs  Lab 10/15/18 1625  TROPONINIHS 5  Chemistry Recent Labs  Lab 10/15/18 1259 10/16/18 0233 10/17/18 0628 10/18/18 0442  NA 135 136 138 136  K 4.1 3.4* 3.6 3.7  CL 97* 100 102 101  CO2 31 29 28 27   GLUCOSE 124* 112* 91 110*  BUN 14 13 11 9   CREATININE 0.88 0.81 0.73 0.82  CALCIUM 9.5 8.9 8.5* 8.5*  PROT 6.6  --   --   --   ALBUMIN 3.8  --   --   --   AST 21  --   --   --   ALT 19  --   --   --   ALKPHOS 60  --   --   --   BILITOT 1.0  --   --   --   GFRNONAA >60 >60 >60 >60  GFRAA >60 >60 >60 >60  ANIONGAP 7 7 8 8      Hematology Recent Labs  Lab 10/15/18 1259 10/17/18 0628 10/18/18 0442  WBC 6.2 5.5 6.4  RBC 4.28 4.01 3.86*  HGB 13.6 13.1 12.7  HCT 42.2 38.7 37.1  MCV 98.6 96.5 96.1  MCH 31.8 32.7 32.9  MCHC 32.2 33.9 34.2  RDW 13.4 13.6 13.5  PLT 206 161 176    BNPNo results for input(s): BNP, PROBNP in the last 168 hours.   DDimer No results for input(s): DDIMER in the last 168  hours.   Radiology    Vas US Carotid  Result Date: 10/16/2018 Carotid Arterial Duplex Study Indications:       Syncope. Risk Factors:      Hypertension. Comparison Study:  No prior study. Performing Technologist: Maudry Mayhew MHA, RDMS, RVT, RDCS  Examination Guidelines: A complete evaluation includes B-mode imaging, spectral Doppler, color Doppler, and power Doppler as needed of all accessible portions of each vessel. Bilateral testing is considered an integral part of a complete examination. Limited examinations for reoccurring indications may be performed as noted.  Right Carotid Findings: +----------+--------+--------+--------+--------------------------+--------+             PSV cm/s EDV cm/s Stenosis Plaque Description         Comments  +----------+--------+--------+--------+--------------------------+--------+  CCA Prox   56       12                smooth and heterogenous              +----------+--------+--------+--------+--------------------------+--------+  CCA Distal 73       17                heterogenous and smooth              +----------+--------+--------+--------+--------------------------+--------+  ICA Prox   75       14                                                     +----------+--------+--------+--------+--------------------------+--------+  ICA Distal 185      17                                                     +----------+--------+--------+--------+--------------------------+--------+  ECA        67  6                 irregular and heterogenous           +----------+--------+--------+--------+--------------------------+--------+ +----------+--------+-------+----------------+-------------------+             PSV cm/s EDV cms Describe         Arm Pressure (mmHG)  +----------+--------+-------+----------------+-------------------+  Subclavian 139              Multiphasic, WNL                      +----------+--------+-------+----------------+-------------------+  +---------+--------+--+--------+-+---------+  Vertebral PSV cm/s 20 EDV cm/s 5 Antegrade  +---------+--------+--+--------+-+---------+  Left Carotid Findings: +----------+--------+--------+--------+-----------------------+--------+             PSV cm/s EDV cm/s Stenosis Plaque Description      Comments  +----------+--------+--------+--------+-----------------------+--------+  CCA Prox   102      16                                                  +----------+--------+--------+--------+-----------------------+--------+  CCA Distal 100      16                                                  +----------+--------+--------+--------+-----------------------+--------+  ICA Prox   72       11                smooth and heterogenous           +----------+--------+--------+--------+-----------------------+--------+  ICA Distal 126      26                                                  +----------+--------+--------+--------+-----------------------+--------+  ECA        89       4                                                   +----------+--------+--------+--------+-----------------------+--------+ +----------+--------+--------+----------------+-------------------+             PSV cm/s EDV cm/s Describe         Arm Pressure (mmHG)  +----------+--------+--------+----------------+-------------------+  Subclavian 118               Multiphasic, WNL                      +----------+--------+--------+----------------+-------------------+ +---------+--------+--+--------+-+---------+  Vertebral PSV cm/s 56 EDV cm/s 9 Antegrade  +---------+--------+--+--------+-+---------+  Summary: Right Carotid: Velocities in the right ICA are consistent with a 1-39% stenosis. Left Carotid: Velocities in the left ICA are consistent with a 1-39% stenosis. Vertebrals:  Bilateral vertebral arteries demonstrate antegrade flow. Subclavians: Normal flow hemodynamics were seen in bilateral subclavian              arteries. *See table(s) above for  measurements and observations.  Electronically signed by Monica Martinez MD on 10/16/2018 at  4:33:23 PM.    Final     Cardiac Studies   TTE 10/16/18:  IMPRESSIONS   1. Left ventricular ejection fraction, by visual estimation, is 60 to 65%. The left ventricle has normal function. Normal left ventricular size. There is no left ventricular hypertrophy.  2. Global right ventricle has normal systolic function.The right ventricular size is normal. No increase in right ventricular wall thickness.  3. Left atrial size was severely dilated.  4. Right atrial size was normal.  5. Small pericardial effusion.  6. Mild calcification of the posterior mitral valve leaflet(s).  7. Moderate thickening of the posterior mitral valve leaflet(s).  8. Moderately decreased mobility of the mitral valve leaflets.  9. The mitral valve is rheumatic. Mild mitral valve regurgitation. Moderate-severe mitral stenosis. 10. Mean transmitral gradient is 68mmHg.     Viewed personally TEE from 2019 and value leaflets appear thin and non calcified. Harmonics on current echocardiogram may be causing artifactual thickening. 11. The tricuspid valve is normal in structure. Tricuspid valve regurgitation was not visualized by color flow Doppler. 12. The aortic valve is normal in structure. Aortic valve regurgitation is mild by color flow Doppler. Mild aortic valve sclerosis without stenosis. 13. The pulmonic valve was normal in structure. Pulmonic valve regurgitation is trivial by color flow Doppler. 14. Mildly elevated pulmonary artery systolic pressure. 15. The inferior vena cava is normal in size with greater than 50% respiratory variability, suggesting right atrial pressure of 3 mmHg. 16. Rheumatic mitral valve stenosis. Transmitral gradient has increased. Consider repeat TEE and stress echo to evaluate transmitral gradient during exercise. May need a candidate for balloon valvuloplasty.  Patient Profile     73 y.o. female 47F  with mild aortic regurgitation, moderate to severe mitral stenosis, Rheumatic mitral valve disease, chronic pericardial effusion, paroxysmal atrial flutter s/p ablation 05/2017, and hypertension here with syncope.  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  The mean gradient was 6 mmHg on 5/18 and 7 mmHg on TTE this admission.  TEE showed severe MS with mitral valve area 1.2 cm^2, PISA radius 1.2 cm, severe PAP (60-63 mmHg).  Given the focal area of calcification, this would seem to be high risk of embolization with balloon valvuloplasty.  She will be evaluated by Dr. Roxy Manns today for MVR.  [facility] for [facility] today.  Aortic valve disease is only mild.  # Essential hypertension: Blood pressure is poorly-controlled.  Carvedilol was decreased due to bradycardia.  Losartan was increased.  She is quite anxious this AM which is also contributing.  Continue current dosing of carvedilol, HCTZ and losartan for now.  Today is the first day of losartan at the higher dose.  # Persistent atrial fibrillation: # s/p ablation: Two episodes since ablation.  Reduced carvedilol as above.  Continue flecainide.  Eliquis was discontinued and she is now on heparin with anticipation of starting warfarin this admission for valvular atrial fibrillation.  # Elevated fasting glucose: Check hemoglobin A1c.     For questions or updates, please contact [place] Please consult [email or web address] for contact info under        Signed, Skeet Latch, MD  10/18/2018, 9:39 AM

## 2018-10-18 NOTE — Progress Notes (Signed)
PROGRESS NOTE    Mary Farrell  KKX:[id number] DOB: March 05, 1945 DOA: 10/15/2018 PCP: Lawerance Cruel, MD    Brief Narrative:   Mary Farrell is a 73 y.o. female with medical history significant of a. Fib s/p ablation procedure x 2, last in May '19. Last TEE 06/23/17 nl LV, mild decrease LV fxn, AV with mild eccentric AI, mild MR, LA enlarged, small ASD with L to R flow. She is followed [facility] with last visit 07/20/18. She has been in sinus rhythm but did have an episode of a fib lasting 18 hrs. Her CHA2DS2-VASc score is 3 and she takes Eliguis. For several days she has been having episodes of feeling light-headed, a flushed feeling that starts in legs and rises to her head, a sense that sounds are receeding and an awareness of her heart beat. She has taken her BP and reports a systolic pressure in the 16'R. Of note her dose or losartan was recently decreased from 189m to 50 mg.  She did send a cardiac tracing to her Cardiologist and had a undetermined rhythm. On the day of adm she was in the kitchen, felt light headed and woozy. The next thing she remembers is being awakened by her husband. Length of LOC less than two minutes. Her husband reports that he saw she was falling and got to her before she actually fell and eased her down. By his report she appeared very pale with bruised appearing lips during the episode. He did not witness any abnormal movement. She was confused for a minute or two but rapidly regained full consciousness. She believes she had moved several feet to the side and fell backward. She hit her head but was not injured. After regaining full consciousness she called cardiology on call and was referred to ED.  Hemodynamically stable in ED. She was awake and alert. CT head and neck without injury or abnormality. EKG with sinus brady, RBBB. Cardiology was consulted and telemetry admission was recommended along with 2D echo. [facility] called to admit.    Assessment & Plan:    Principal Problem:   Syncope Active Problems:   Mitral stenosis and aortic insufficiency   GERD (gastroesophageal reflux disease)   Anxiety   Essential hypertension   Pericardial effusion   Persistent atrial fibrillation   Hypokalemia   Severe mitral regurgitation   Syncope Patient presenting with intermittent episodes of dizziness/lightheadedness and flushing over the past few weeks.  CT head without acute intracranial findings.  No signs of active infection.  No metabolic or electrolyte derangements.  EKG notable for sinus bradycardia with first-degree AV block, right axis, incomplete RBBB.  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  Suspect etiology from her underlying valvular heart disease.  Mitral stenosis, severe Rheumatic mitral valve disease Leaflets look thicker on recent TTE, with concern of a possible thrombus given the hyperechoic density noted.  TEE noted calcified fixed nodule, and her findings of moderate/severe elevated pulmonary pressures along with A. fib and dilated LA warrant mitral valve replacement. --Right heart catheterization today --CTS consulted, likely require mitral valve replacement  Persistent atrial fibrillation Patient has undergone multiple cardioversions in the past, ablation on 2 occasions and was previously maintained normal sinus rhythm but now back in atrial fibrillation.  Was on Eliquis for chronic anticoagulation, but now will likely be changed to Coumadin given likely rheumatic valvular disease/A. Fib. --Continue heparin drip, likely will transition to warfarin outpatient --CTS considering  benefit with MAZE procedure with clipping of appendage --Continue rate control with carvedilol 6.25 mg p.o. twice daily --Flecainide 100 mg p.o. twice daily  Essential hypertension BP 142/59 this morning. --Continue carvedilol 6.25 mg p.o. twice daily, hydrochlorothiazide 12.5 mg p.o. daily, losartan  50 mg p.o. daily. --Continue to monitor blood pressure closely  Anxiety: Doxepin qHS prn, alprazolam 0.125 mg p.o. twice daily as needed  GERD: Continue PPI and famotidine   DVT prophylaxis: Heparin drip Code Status: Full code Family Communication: Husband who is present at bedside Disposition Plan: Continue inpatient hospitalization, likely will need mitral valve replacement, possible MAZE procedure   Consultants:   Cardiology  CTS  Procedures:  Transthoracic echocardiogram 10/16/2018: IMPRESSIONS    1. Left ventricular ejection fraction, by visual estimation, is 60 to 65%. The left ventricle has normal function. Normal left ventricular size. There is no left ventricular hypertrophy.  2. Global right ventricle has normal systolic function.The right ventricular size is normal. No increase in right ventricular wall thickness.  3. Left atrial size was severely dilated.  4. Right atrial size was normal.  5. Small pericardial effusion.  6. Mild calcification of the posterior mitral valve leaflet(s).  7. Moderate thickening of the posterior mitral valve leaflet(s).  8. Moderately decreased mobility of the mitral valve leaflets.  9. The mitral valve is rheumatic. Mild mitral valve regurgitation. Moderate-severe mitral stenosis. 10. Mean transmitral gradient is 58mHg.     Viewed personally TEE from 2019 and value leaflets appear thin and non calcified. Harmonics on current echocardiogram may be causing artifactual thickening. 11. The tricuspid valve is normal in structure. Tricuspid valve regurgitation was not visualized by color flow Doppler. 12. The aortic valve is normal in structure. Aortic valve regurgitation is mild by color flow Doppler. Mild aortic valve sclerosis without stenosis. 13. The pulmonic valve was normal in structure. Pulmonic valve regurgitation is trivial by color flow Doppler. 14. Mildly elevated pulmonary artery systolic pressure. 15. The inferior vena cava is  normal in size with greater than 50% respiratory variability, suggesting right atrial pressure of 3 mmHg. 16. Rheumatic mitral valve stenosis. Transmitral gradient has increased. Consider repeat TEE and stress echo to evaluate transmitral gradient during exercise. May need a candidate for balloon valvuloplasty.  Transesophageal echocardiogram 10/17/2018: IMPRESSIONS    1. Left ventricular ejection fraction, by visual estimation, is 55 to 60%. The left ventricle has normal function. Normal left ventricular size. There is no left ventricular hypertrophy.  2. Global right ventricle has normal systolic function.The right ventricular size is normal. No increase in right ventricular wall thickness.  3. Pulmonary hypertension.  4. Left atrial size was severely dilated.  5. No LAA thrombus.  6. Right atrial size was normal.  7. The mitral valve is rheumatic. Mild mitral valve regurgitation. Moderate-severe mitral stenosis. moderate mitral valve stenosis.  8. Moderate stenosis by pressure half time 1.2cm squared and mean gradient (873mg). PISA radius 1.2cm, with calculated area of 1.2 cm squared.     Valve leaflets are thin, pliable, no subvalvular thickening, however surrounding P1 (posterior leaflet) there is a calcified fixed nodule (MAC like appearance).  9. The tricuspid valve is normal in structure. Tricuspid valve regurgitation is mild. 10. The aortic valve is tricuspid Aortic valve regurgitation is mild by color flow Doppler. Mild aortic valve sclerosis without stenosis. 11. The pulmonic valve was normal in structure. Pulmonic valve regurgitation is not visualized by color flow Doppler. 12. Moderately elevated pulmonary artery systolic pressure. 13. The inferior vena cava is  normal in size with greater than 50% respiratory variability, suggesting right atrial pressure of 3 mmHg. 14. Consider stress echo with transmitral gradient measurements. 15. Her moderate to severely elevated pulmonary  pressures may indicate along with AFIB and markedly dilated LA along with symptoms, that mitral valve replacement is indicated. 16. However pulmonary pressures are high (60-18mHg). Although leaflets are thin, pliable and without subvalvular thickening, she does have a calcified density on P1 which would likely increase risk with balloon valvuloplasty. 17. Rheumatic moderate mitral stenosis.  Right heart catheterization 10/18/2018:  Antimicrobials:  none   Subjective: Patient seen and examined at bedside, husband present.  About to go down for her heart catheterization.  Slightly anxious this morning.  No specific complaints this morning.  Husband questions answered at bedside.  Denies headache, no fever/chills/night sweats, no chest pain, no palpitations, no shortness of breath, no abdominal pain, no weakness, no fatigue.  No acute events overnight per nurse staff.  Objective: Vitals:   10/18/18 1232 10/18/18 1310 10/18/18 1354 10/18/18 1516  BP: (!) 127/51 (!) 116/54 (!) 111/50 (!) 122/53  Pulse:      Resp:      Temp:      TempSrc:      SpO2: 98%     Weight:      Height:        Intake/Output Summary (Last 24 hours) at 10/18/2018 1556 Last data filed at 10/18/2018 [id number] per 24 hour  Intake 631.06 ml  Output 1250 ml  Net -618.94 ml   Filed Weights   10/16/18 2146 10/17/18 0051 10/18/18 0540  Weight: 69.2 kg 69.3 kg 69.7 kg    Examination:  General exam: Appears calm and comfortable  Respiratory system: Clear to auscultation. Respiratory effort normal. Cardiovascular system: S1 & S2 heard, RRR. No JVD, 3/6 diastolic murmur left lower sternal border, rubs, gallops or clicks. No pedal edema. Gastrointestinal system: Abdomen is nondistended, soft and nontender. No organomegaly or masses felt. Normal bowel sounds heard. Central nervous system: Alert and oriented. No focal neurological deficits. Extremities: Symmetric 5 x 5 power. Skin: No rashes, lesions or ulcers  Psychiatry: Judgement and insight appear normal. Mood & affect appropriate.  Mildly anxious    Data Reviewed: I have personally reviewed following labs and imaging studies  CBC: Recent Labs  Lab 10/15/18 1259 10/17/18 0628 10/18/18 0442 10/18/18 1044 10/18/18 1049  WBC 6.2 5.5 6.4  --   --   HGB 13.6 13.1 12.7 12.6 12.6  HCT 42.2 38.7 37.1 37.0 37.0  MCV 98.6 96.5 96.1  --   --   PLT 206 161 176  --   --    Basic Metabolic Panel: Recent Labs  Lab 10/15/18 1259 10/16/18 0233 10/17/18 0628 10/18/18 0442 10/18/18 1044 10/18/18 1049  NA 135 136 138 136 141 141  K 4.1 3.4* 3.6 3.7 4.0 4.0  CL 97* 100 102 101  --   --   CO2 _0 --   --   GLUCOSE 124* 112* 91 110*  --   --   BUN _1 --   --   CREATININE 0.88 0.81 0.73 0.82  --   --   CALCIUM 9.5 8.9 8.5* 8.5*  --   --    GFR: Estimated Creatinine Clearance: 52.5 mL/min (by C-G formula based on SCr of 0.82 mg/dL). Liver Function Tests: Recent Labs  Lab 10/15/18 1259  AST 21  ALT 19  ALKPHOS 60  BILITOT 1.0  PROT 6.6  ALBUMIN 3.8   No results for input(s): LIPASE, AMYLASE in the last 168 hours. No results for input(s): AMMONIA in the last 168 hours. Coagulation Profile: No results for input(s): INR, PROTIME in the last 168 hours. Cardiac Enzymes: No results for input(s): CKTOTAL, CKMB, CKMBINDEX, TROPONINI in the last 168 hours. BNP (last 3 results) No results for input(s): PROBNP in the last 8760 hours. HbA1C: No results for input(s): HGBA1C in the last 72 hours. CBG: Recent Labs  Lab 10/15/18 1257 10/17/18 0654 10/18/18 0503  GLUCAP 122* 97 113*   Lipid Profile: No results for input(s): CHOL, HDL, LDLCALC, TRIG, CHOLHDL, LDLDIRECT in the last 72 hours. Thyroid Function Tests: No results for input(s): TSH, T4TOTAL, FREET4, T3FREE, THYROIDAB in the last 72 hours. Anemia Panel: No results for input(s): VITAMINB12, FOLATE, FERRITIN, TIBC, IRON, RETICCTPCT in the last 72 hours. Sepsis  Labs: No results for input(s): PROCALCITON, LATICACIDVEN in the last 168 hours.  Recent Results (from the past 240 hour(s))  SARS CORONAVIRUS 2 (TAT 6-24 HRS) Nasopharyngeal Nasopharyngeal Swab     Status: None   Collection Time: 10/15/18  6:25 PM   Specimen: Nasopharyngeal Swab  Result Value Ref Range Status   SARS Coronavirus 2 NEGATIVE NEGATIVE Final    Comment: (NOTE) SARS-CoV-2 target nucleic acids are NOT DETECTED. The SARS-CoV-2 RNA is generally detectable in upper and lower respiratory specimens during the acute phase of infection. Negative results do not preclude SARS-CoV-2 infection, do not rule out co-infections with other pathogens, and should not be used as the sole basis for treatment or other patient management decisions. Negative results must be combined with clinical observations, patient history, and epidemiological information. The expected result is Negative. Fact Sheet for Patients: SugarRoll.be Fact Sheet for Healthcare Providers: https://www.woods-mathews.com/ This test is not yet approved or cleared by the [place] FDA and  has been authorized for detection and/or diagnosis of SARS-CoV-2 by FDA under an Emergency Use Authorization (EUA). This EUA will remain  in effect (meaning this test can be used) for the duration of the COVID-19 declaration under Section 56 4(b)(1) of the Act, 21 U.S.C. section 360bbb-3(b)(1), unless the authorization is terminated or revoked sooner. Performed at [facility], [place] [address], [place], [place] [address]          Radiology Studies: No results found.      Scheduled Meds: . carvedilol  6.25 mg Oral BID WC  . famotidine  20 mg Oral QHS  . flecainide  100 mg Oral BID  . gabapentin  300 mg Oral TID  . hydrochlorothiazide  12.5 mg Oral Daily  . loratadine  10 mg Oral Daily  . losartan  50 mg Oral Daily  . oxybutynin  10 mg Oral Daily  . pantoprazole  40 mg  Oral Daily  . sodium chloride flush  3 mL Intravenous Once  . sodium chloride flush  3 mL Intravenous Q12H  . sodium chloride flush  3 mL Intravenous Q12H  . sodium chloride flush  3 mL Intravenous Q12H  . sodium chloride flush  3 mL Intravenous Q12H   Continuous Infusions: . sodium chloride    . sodium chloride 75 mL/hr at 10/18/18 1225  . sodium chloride    . heparin       LOS: 1 day    Time spent: 38 minutes spent on chart review, discussion with nursing staff, consultants, updating family and interview/physical exam; more than 50% of that time was  spent in counseling and/or coordination of care.     J [place] DO [facility] Pager [phone]  If 7PM-7AM, please contact night-coverage [email or web address] Password [facility] 10/18/2018, 3:56 PM

## 2018-10-18 NOTE — Plan of Care (Signed)

## 2018-10-18 NOTE — Progress Notes (Signed)
Pt c/o nausea early this AM. Zofran given. Pt also anxious last night and this morning due to impending cath. Xanax given. Will continue to monitor.

## 2018-10-18 NOTE — Progress Notes (Signed)
ANTICOAGULATION CONSULT NOTE - Follow Up Consult  Pharmacy Consult for heparin Indication: atrial fibrillation  Labs: Recent Labs    10/15/18 1259 10/15/18 1625 10/16/18 0233 10/17/18 0628 10/17/18 1657 10/18/18 0442  HGB 13.6  --   --  13.1  --  12.7  HCT 42.2  --   --  38.7  --  37.1  PLT 206  --   --  161  --  176  APTT  --   --   --  145* 109* 87*  HEPARINUNFRC  --   --   --  2.12*  --  1.03*  CREATININE 0.88  --  0.81 0.73  --  0.82  TROPONINIHS  --  5  --   --   --   --     Assessment/Plan:  73yo female therapeutic on heparin after rate change. Will continue gtt at current rate and confirm stable with additional PTT.   Wynona Neat, PharmD, BCPS  10/18/2018,5:53 AM

## 2018-10-18 NOTE — H&P (View-Only) (Signed)
Progress Note  Patient Name: Mary Farrell Date of Encounter: 10/18/2018  Primary Cardiologist: Skeet Latch, MD   Subjective   Feeling anxious and nervous.  No chest pain, shortness of breath, lightheadedness or dizziness.  Inpatient Medications    Scheduled Meds:  carvedilol  6.25 mg Oral BID WC   famotidine  20 mg Oral QHS   flecainide  100 mg Oral BID   gabapentin  300 mg Oral TID   hydrochlorothiazide  12.5 mg Oral Daily   loratadine  10 mg Oral Daily   losartan  50 mg Oral Daily   oxybutynin  10 mg Oral Daily   pantoprazole  40 mg Oral Daily   sodium chloride flush  3 mL Intravenous Once   sodium chloride flush  3 mL Intravenous Q12H   sodium chloride flush  3 mL Intravenous Q12H   sodium chloride flush  3 mL Intravenous Q12H   Continuous Infusions:  sodium chloride     sodium chloride     sodium chloride 1 mL/kg/hr (10/18/18 0514)   heparin 750 Units/hr (10/18/18 0600)   PRN Meds: sodium chloride, sodium chloride, acetaminophen, ALPRAZolam, doxepin, ondansetron (ZOFRAN) IV, polyethylene glycol, sodium chloride flush, sodium chloride flush, traMADol   Vital Signs    Vitals:   10/18/18 0134 10/18/18 0454 10/18/18 0540 10/18/18 0932  BP: (!) 148/69 (!) 142/59  (!) 156/68  Pulse: 70 66  72  Resp: 16 16  18   Temp: 98.4 F (36.9 C) 98.3 F (36.8 C)    TempSrc: Oral Oral    SpO2: 99% 99%  98%  Weight:   69.7 kg   Height:        Intake/Output Summary (Last 24 hours) at 10/18/2018 0939 Last data filed at 10/18/2018 [id number] Gross per 24 hour  Intake 871.06 ml  Output 1250 ml  Net -378.94 ml   Last 3 Weights 10/18/2018 10/17/2018 10/16/2018  Weight (lbs) 153 lb 9.6 oz 152 lb 11.2 oz 152 lb 9.6 oz  Weight (kg) 69.673 kg 69.264 kg 69.219 kg      Telemetry    Sinus rhythm.  No events. - Personally Reviewed  ECG    N/- Personally Reviewed  Physical Exam   VS:  BP (!) 156/68 (BP Location: Left Arm)    Pulse 72    Temp 98.3 F (36.8 C)  (Oral)    Resp 18    Ht 5' (1.524 m)    Wt 69.7 kg    SpO2 98%    BMI 30.00 kg/m  , BMI Body mass index is 30 kg/m. GENERAL:  Well appearing.  Anxious HEENT: Pupils equal round and reactive, fundi not visualized, oral mucosa unremarkable NECK:  No jugular venous distention, waveform within normal limits, carotid upstroke brisk and symmetric, no bruits LUNGS:  Clear to auscultation bilaterally HEART:  RRR.  PMI not displaced or sustained,S1 and S2 within normal limits, +S3, no S4, no clicks, no rubs,  II/VI diastolic murmur ABD:  Flat, positive bowel sounds normal in frequency in pitch, no bruits, no rebound, no guarding, no midline pulsatile mass, no hepatomegaly, no splenomegaly EXT:  2 plus pulses throughout, no edema, no cyanosis no clubbing SKIN:  No rashes no nodules NEURO:  Cranial nerves II through XII grossly intact, motor grossly intact throughout PSYCH:  Cognitively intact, oriented to person place and time   Labs    High Sensitivity Troponin:   Recent Labs  Lab 10/15/18 1625  TROPONINIHS 5  Chemistry Recent Labs  Lab 10/15/18 1259 10/16/18 0233 10/17/18 0628 10/18/18 0442  NA 135 136 138 136  K 4.1 3.4* 3.6 3.7  CL 97* 100 102 101  CO2 31 29 28 27   GLUCOSE 124* 112* 91 110*  BUN 14 13 11 9   CREATININE 0.88 0.81 0.73 0.82  CALCIUM 9.5 8.9 8.5* 8.5*  PROT 6.6  --   --   --   ALBUMIN 3.8  --   --   --   AST 21  --   --   --   ALT 19  --   --   --   ALKPHOS 60  --   --   --   BILITOT 1.0  --   --   --   GFRNONAA >60 >60 >60 >60  GFRAA >60 >60 >60 >60  ANIONGAP 7 7 8 8      Hematology Recent Labs  Lab 10/15/18 1259 10/17/18 0628 10/18/18 0442  WBC 6.2 5.5 6.4  RBC 4.28 4.01 3.86*  HGB 13.6 13.1 12.7  HCT 42.2 38.7 37.1  MCV 98.6 96.5 96.1  MCH 31.8 32.7 32.9  MCHC 32.2 33.9 34.2  RDW 13.4 13.6 13.5  PLT 206 161 176    BNPNo results for input(s): BNP, PROBNP in the last 168 hours.   DDimer No results for input(s): DDIMER in the last 168  hours.   Radiology    Vas US Carotid  Result Date: 10/16/2018 Carotid Arterial Duplex Study Indications:       Syncope. Risk Factors:      Hypertension. Comparison Study:  No prior study. Performing Technologist: Maudry Mayhew MHA, RDMS, RVT, RDCS  Examination Guidelines: A complete evaluation includes B-mode imaging, spectral Doppler, color Doppler, and power Doppler as needed of all accessible portions of each vessel. Bilateral testing is considered an integral part of a complete examination. Limited examinations for reoccurring indications may be performed as noted.  Right Carotid Findings: +----------+--------+--------+--------+--------------------------+--------+             PSV cm/s EDV cm/s Stenosis Plaque Description         Comments  +----------+--------+--------+--------+--------------------------+--------+  CCA Prox   56       12                smooth and heterogenous              +----------+--------+--------+--------+--------------------------+--------+  CCA Distal 73       17                heterogenous and smooth              +----------+--------+--------+--------+--------------------------+--------+  ICA Prox   75       14                                                     +----------+--------+--------+--------+--------------------------+--------+  ICA Distal 185      17                                                     +----------+--------+--------+--------+--------------------------+--------+  ECA        67  6                 irregular and heterogenous           +----------+--------+--------+--------+--------------------------+--------+ +----------+--------+-------+----------------+-------------------+             PSV cm/s EDV cms Describe         Arm Pressure (mmHG)  +----------+--------+-------+----------------+-------------------+  Subclavian 139              Multiphasic, WNL                      +----------+--------+-------+----------------+-------------------+  +---------+--------+--+--------+-+---------+  Vertebral PSV cm/s 20 EDV cm/s 5 Antegrade  +---------+--------+--+--------+-+---------+  Left Carotid Findings: +----------+--------+--------+--------+-----------------------+--------+             PSV cm/s EDV cm/s Stenosis Plaque Description      Comments  +----------+--------+--------+--------+-----------------------+--------+  CCA Prox   102      16                                                  +----------+--------+--------+--------+-----------------------+--------+  CCA Distal 100      16                                                  +----------+--------+--------+--------+-----------------------+--------+  ICA Prox   72       11                smooth and heterogenous           +----------+--------+--------+--------+-----------------------+--------+  ICA Distal 126      26                                                  +----------+--------+--------+--------+-----------------------+--------+  ECA        89       4                                                   +----------+--------+--------+--------+-----------------------+--------+ +----------+--------+--------+----------------+-------------------+             PSV cm/s EDV cm/s Describe         Arm Pressure (mmHG)  +----------+--------+--------+----------------+-------------------+  Subclavian 118               Multiphasic, WNL                      +----------+--------+--------+----------------+-------------------+ +---------+--------+--+--------+-+---------+  Vertebral PSV cm/s 56 EDV cm/s 9 Antegrade  +---------+--------+--+--------+-+---------+  Summary: Right Carotid: Velocities in the right ICA are consistent with a 1-39% stenosis. Left Carotid: Velocities in the left ICA are consistent with a 1-39% stenosis. Vertebrals:  Bilateral vertebral arteries demonstrate antegrade flow. Subclavians: Normal flow hemodynamics were seen in bilateral subclavian              arteries. *See table(s) above for  measurements and observations.  Electronically signed by Monica Martinez MD on 10/16/2018 at  4:33:23 PM.    Final     Cardiac Studies   TTE 10/16/18:  IMPRESSIONS   1. Left ventricular ejection fraction, by visual estimation, is 60 to 65%. The left ventricle has normal function. Normal left ventricular size. There is no left ventricular hypertrophy.  2. Global right ventricle has normal systolic function.The right ventricular size is normal. No increase in right ventricular wall thickness.  3. Left atrial size was severely dilated.  4. Right atrial size was normal.  5. Small pericardial effusion.  6. Mild calcification of the posterior mitral valve leaflet(s).  7. Moderate thickening of the posterior mitral valve leaflet(s).  8. Moderately decreased mobility of the mitral valve leaflets.  9. The mitral valve is rheumatic. Mild mitral valve regurgitation. Moderate-severe mitral stenosis. 10. Mean transmitral gradient is 18mmHg.     Viewed personally TEE from 2019 and value leaflets appear thin and non calcified. Harmonics on current echocardiogram may be causing artifactual thickening. 11. The tricuspid valve is normal in structure. Tricuspid valve regurgitation was not visualized by color flow Doppler. 12. The aortic valve is normal in structure. Aortic valve regurgitation is mild by color flow Doppler. Mild aortic valve sclerosis without stenosis. 13. The pulmonic valve was normal in structure. Pulmonic valve regurgitation is trivial by color flow Doppler. 14. Mildly elevated pulmonary artery systolic pressure. 15. The inferior vena cava is normal in size with greater than 50% respiratory variability, suggesting right atrial pressure of 3 mmHg. 16. Rheumatic mitral valve stenosis. Transmitral gradient has increased. Consider repeat TEE and stress echo to evaluate transmitral gradient during exercise. May need a candidate for balloon valvuloplasty.  Patient Profile     73 y.o. female 21F  with mild aortic regurgitation, moderate to severe mitral stenosis, Rheumatic mitral valve disease, chronic pericardial effusion, paroxysmal atrial flutter s/p ablation 05/2017, and hypertension here with syncope.  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  The mean gradient was 6 mmHg on 5/18 and 7 mmHg on TTE this admission.  TEE showed severe MS with mitral valve area 1.2 cm^2, PISA radius 1.2 cm, severe PAP (60-63 mmHg).  Given the focal area of calcification, this would seem to be high risk of embolization with balloon valvuloplasty.  She will be evaluated by Dr. Roxy Manns today for MVR.  [facility] will [facility] for [facility].  Aortic valve disease is only mild.  # Essential hypertension: Blood pressure is poorly-controlled.  Carvedilol was decreased due to bradycardia.  Losartan was increased.  She is quite anxious this AM which is also contributing.  Continue current dosing of carvedilol, HCTZ and losartan for now.  Today is the first day of losartan at the higher dose.  # Persistent atrial fibrillation: # s/p ablation: Two episodes since ablation.  Reduced carvedilol as above.  Continue flecainide.  Eliquis was discontinued and she is now on heparin with anticipation of starting warfarin this admission for valvular atrial fibrillation.  # Elevated fasting glucose: Check hemoglobin A1c.     For questions or updates, please contact [place] Please consult [email or web address] for contact info under        Signed, Skeet Latch, MD  10/18/2018, 9:39 AM

## 2018-10-18 NOTE — Progress Notes (Signed)
PT Cancellation Note  Patient Details Name: Mary Farrell MRN: [id number] DOB: [birth year]   Cancelled Treatment:    Reason Eval/Treat Not Completed: Medical issues which prohibited therapy.  Pt is on bedrest at least until 4:30..Will retry as time and pt allow.   Ramond Dial 10/18/2018, 2:23 PM   Mee Hives, PT MS Acute Rehab Dept. Number: [place] [facility] [place]

## 2018-10-18 NOTE — Progress Notes (Signed)
TR band has been removed from patient R radial, site is level zero gauze and tegaderm applied to site.

## 2018-10-19 ENCOUNTER — Telehealth: Payer: Self-pay

## 2018-10-19 ENCOUNTER — Telehealth: Payer: Self-pay | Admitting: *Deleted

## 2018-10-19 DIAGNOSIS — I340 Nonrheumatic mitral (valve) insufficiency: Secondary | ICD-10-CM

## 2018-10-19 LAB — HEMOGLOBIN A1C
Hgb A1c MFr Bld: 5.2 % (ref 4.8–5.6)
Mean Plasma Glucose: 103 mg/dL

## 2018-10-19 LAB — CBC
HCT: 36.2 % (ref 36.0–46.0)
Hemoglobin: 11.8 g/dL — ABNORMAL LOW (ref 12.0–15.0)
MCH: 31.9 pg (ref 26.0–34.0)
MCHC: 32.6 g/dL (ref 30.0–36.0)
MCV: 97.8 fL (ref 80.0–100.0)
Platelets: 189 10*3/uL (ref 150–400)
RBC: 3.7 MIL/uL — ABNORMAL LOW (ref 3.87–5.11)
RDW: 13.7 % (ref 11.5–15.5)
WBC: 7.2 10*3/uL (ref 4.0–10.5)
nRBC: 0 % (ref 0.0–0.2)

## 2018-10-19 LAB — PROTIME-INR
INR: 1.2 (ref 0.8–1.2)
Prothrombin Time: 14.6 seconds (ref 11.4–15.2)

## 2018-10-19 LAB — HEPARIN LEVEL (UNFRACTIONATED): Heparin Unfractionated: 0.38 IU/mL (ref 0.30–0.70)

## 2018-10-19 LAB — APTT: aPTT: 56 seconds — ABNORMAL HIGH (ref 24–36)

## 2018-10-19 LAB — GLUCOSE, CAPILLARY: Glucose-Capillary: 96 mg/dL (ref 70–99)

## 2018-10-19 MED ORDER — ENOXAPARIN SODIUM 80 MG/0.8ML ~~LOC~~ SOLN
70.0000 mg | Freq: Two times a day (BID) | SUBCUTANEOUS | Status: DC
  Administered 2018-10-19 – 2018-10-20 (×3): 70 mg via SUBCUTANEOUS
  Filled 2018-10-19 (×4): qty 0.8

## 2018-10-19 MED ORDER — WARFARIN - PHARMACIST DOSING INPATIENT: Freq: Every day | Status: DC

## 2018-10-19 MED ORDER — CARVEDILOL 3.125 MG PO TABS
3.1250 mg | ORAL_TABLET | Freq: Two times a day (BID) | ORAL | Status: DC
  Administered 2018-10-19 – 2018-10-20 (×2): 3.125 mg via ORAL
  Filled 2018-10-19 (×2): qty 1

## 2018-10-19 MED ORDER — WARFARIN SODIUM 5 MG PO TABS
5.0000 mg | ORAL_TABLET | Freq: Once | ORAL | Status: AC
  Administered 2018-10-19: 17:00:00 5 mg via ORAL
  Filled 2018-10-19: qty 1

## 2018-10-19 MED FILL — Heparin Sod (Porcine)-NaCl IV Soln 1000 Unit/500ML-0.9%: INTRAVENOUS | Qty: 1000 | Status: AC

## 2018-10-19 MED FILL — Heparin Sodium (Porcine) Inj 1000 Unit/ML: INTRAMUSCULAR | Qty: 10 | Status: AC

## 2018-10-19 NOTE — Addendum Note (Signed)
Addended by: Alvina Filbert B on: 10/19/2018 05:13 PM   Modules accepted: Level of Service, SmartSet

## 2018-10-19 NOTE — Care Management Important Message (Signed)
Important Message  Patient Details  Name: Mary Farrell MRN: [id number] Date of Birth: 09/06/1945   Medicare Important Message Given:  Yes     Shelda Altes 10/19/2018, 3:15 PM

## 2018-10-19 NOTE — Progress Notes (Signed)
Progress Note  Patient Name: Mary Farrell Date of Encounter: 10/19/2018  Primary Cardiologist: Skeet Latch, MD   Subjective   No complaints of chest pain, SOB, or palpitations this morning. No recurrent syncope, dizziness, or lightheadedness. She understand that she will need lovenox bridging until her INR is therapeutic with coumadin dosing. She reports some concerns with her husbands memory and ability to take care of her after surgery. We discussed that there are services which can help her at home if needed and that factor should not weigh on her decision as there will a safe plan in place before she is discharged. She is eager to get home to her cat.   Inpatient Medications    Scheduled Meds: . carvedilol  6.25 mg Oral BID WC  . famotidine  20 mg Oral QHS  . flecainide  100 mg Oral BID  . gabapentin  300 mg Oral TID  . hydrochlorothiazide  12.5 mg Oral Daily  . loratadine  10 mg Oral Daily  . losartan  50 mg Oral Daily  . oxybutynin  10 mg Oral Daily  . pantoprazole  40 mg Oral Daily  . sodium chloride flush  3 mL Intravenous Once  . sodium chloride flush  3 mL Intravenous Q12H  . sodium chloride flush  3 mL Intravenous Q12H  . sodium chloride flush  3 mL Intravenous Q12H  . sodium chloride flush  3 mL Intravenous Q12H   Continuous Infusions: . sodium chloride    . sodium chloride     PRN Meds: sodium chloride, sodium chloride, acetaminophen, ALPRAZolam, doxepin, ondansetron (ZOFRAN) IV, polyethylene glycol, sodium chloride flush, sodium chloride flush, traMADol   Vital Signs    Vitals:   10/18/18 2350 10/19/18 0443 10/19/18 0527 10/19/18 0914  BP:  (!) 138/91  (!) 121/53  Pulse: 70 (!) 107  73  Resp: _0 Temp:  99.7 F (37.6 C)  98.1 F (36.7 C)  TempSrc:  Oral  Oral  SpO2: 98% (!) 78%    Weight:   69.8 kg   Height:        Intake/Output Summary (Last 24 hours) at 10/19/2018 1013 Last data filed at 10/19/2018 0850 Gross per 24 hour  Intake  985.82 ml  Output 1050 ml  Net -64.18 ml   Filed Weights   10/17/18 0051 10/18/18 0540 10/19/18 0527  Weight: 69.3 kg 69.7 kg 69.8 kg    Telemetry    Sinus rhythm with intermittent sinus bradycardia with rare <3s pauses noted.  - Personally Reviewed  ECG    No new tracings  Physical Exam   GEN: Sitting upright in bedside chair in no acute distress.   Neck: No JVD, no carotid bruits Cardiac: RRR, +gallop and diastolic murmur, no rubs.  Respiratory: Clear to auscultation bilaterally, no wheezes/ rales/ rhonchi GI: NABS, Soft, nontender, non-distended  MS: No edema; No deformity. Neuro:  Nonfocal, moving all extremities spontaneously Psych: Normal affect   Labs    Chemistry Recent Labs  Lab 10/15/18 1259  10/17/18 0628 10/18/18 0442  10/18/18 1049 10/18/18 1050 10/18/18 1709  NA 135   < > 138 136   < > 141 142 139  K 4.1   < > 3.6 3.7   < > 4.0 3.9 3.5  CL 97*   < > 102 101  --   --   --  105  CO2 31   < > 28 27  --   --   --  27  GLUCOSE 124*   < > 91 110*  --   --   --  115*  BUN 14   < > 11 9  --   --   --  7*  CREATININE 0.88   < > 0.73 0.82  --   --   --  0.70  CALCIUM 9.5   < > 8.5* 8.5*  --   --   --  8.3*  PROT 6.6  --   --   --   --   --   --   --   ALBUMIN 3.8  --   --   --   --   --   --   --   AST 21  --   --   --   --   --   --   --   ALT 19  --   --   --   --   --   --   --   ALKPHOS 60  --   --   --   --   --   --   --   BILITOT 1.0  --   --   --   --   --   --   --   GFRNONAA >60   < > >60 >60  --   --   --  >60  GFRAA >60   < > >60 >60  --   --   --  >60  ANIONGAP 7   < > 8 8  --   --   --  7   < > = values in this interval not displayed.     Hematology Recent Labs  Lab 10/17/18 0628 10/18/18 0442  10/18/18 1049 10/18/18 1050 10/19/18 0359  WBC 5.5 6.4  --   --   --  7.2  RBC 4.01 3.86*  --   --   --  3.70*  HGB 13.1 12.7   < > 12.6 12.2 11.8*  HCT 38.7 37.1   < > 37.0 36.0 36.2  MCV 96.5 96.1  --   --   --  97.8  MCH 32.7 32.9  --    --   --  31.9  MCHC 33.9 34.2  --   --   --  32.6  RDW 13.6 13.5  --   --   --  13.7  PLT 161 176  --   --   --  189   < > = values in this interval not displayed.    Cardiac EnzymesNo results for input(s): TROPONINI in the last 168 hours. No results for input(s): TROPIPOC in the last 168 hours.   BNPNo results for input(s): BNP, PROBNP in the last 168 hours.   DDimer No results for input(s): DDIMER in the last 168 hours.   Radiology    No results found.  Cardiac Studies   Right/Left heart catheterization 10/18/2018:  Hemodynamic findings consistent with mild pulmonary hypertension.  LV end diastolic pressure is normal.  There is severe mitral valve stenosis -suggested by a echocardiogram, large PCWP V waves along with elevated PCWP but normal LVEDP would also suggest this.  Angiographically normal coronary arteries   SUMMARY  Angiographically normal coronary arteries  Mild pulmonary hypertension with mildly elevated PCWP but normal LVEDP suggestive of mitral stenosis  Significant V wave noted on PCWP waveform suggesting mitral stenosis.  RECOMMENDATIONS  Return to nursing unit for ongoing care.  Proceed with plans for mitral valve replacement   TEE 10/17/2018 1. Left ventricular ejection fraction, by visual estimation, is 55 to 60%. The left ventricle has normal function. Normal left ventricular size. There is no left ventricular hypertrophy.  2. Global right ventricle has normal systolic function.The right ventricular size is normal. No increase in right ventricular wall thickness.  3. Pulmonary hypertension.  4. Left atrial size was severely dilated.  5. No LAA thrombus.  6. Right atrial size was normal.  7. The mitral valve is rheumatic. Mild mitral valve regurgitation. Moderate-severe mitral stenosis. moderate mitral valve stenosis.  8. Moderate stenosis by pressure half time 1.2cm squared and mean gradient (89mHg). PISA radius 1.2cm, with calculated area  of 1.2 cm squared.     Valve leaflets are thin, pliable, no subvalvular thickening, however surrounding P1 (posterior leaflet) there is a calcified fixed nodule (MAC like appearance).  9. The tricuspid valve is normal in structure. Tricuspid valve regurgitation is mild. 10. The aortic valve is tricuspid Aortic valve regurgitation is mild by color flow Doppler. Mild aortic valve sclerosis without stenosis. 11. The pulmonic valve was normal in structure. Pulmonic valve regurgitation is not visualized by color flow Doppler. 12. Moderately elevated pulmonary artery systolic pressure. 13. The inferior vena cava is normal in size with greater than 50% respiratory variability, suggesting right atrial pressure of 3 mmHg. 14. Consider stress echo with transmitral gradient measurements. 15. Her moderate to severely elevated pulmonary pressures may indicate along with AFIB and markedly dilated LA along with symptoms, that mitral valve replacement is indicated. 16. However pulmonary pressures are high (60-674mg). Although leaflets are thin, pliable and without subvalvular thickening, she does have a calcified density on P1 which would likely increase risk with balloon valvuloplasty. 17. Rheumatic moderate mitral stenosis.  Echocardiogram 10/16/2018: 1. Left ventricular ejection fraction, by visual estimation, is 60 to 65%. The left ventricle has normal function. Normal left ventricular size. There is no left ventricular hypertrophy.  2. Global right ventricle has normal systolic function.The right ventricular size is normal. No increase in right ventricular wall thickness.  3. Left atrial size was severely dilated.  4. Right atrial size was normal.  5. Small pericardial effusion.  6. Mild calcification of the posterior mitral valve leaflet(s).  7. Moderate thickening of the posterior mitral valve leaflet(s).  8. Moderately decreased mobility of the mitral valve leaflets.  9. The mitral valve is rheumatic.  Mild mitral valve regurgitation. Moderate-severe mitral stenosis. 10. Mean transmitral gradient is 89m31m.     Viewed personally TEE from 2019 and value leaflets appear thin and non calcified. Harmonics on current echocardiogram may be causing artifactual thickening. 11. The tricuspid valve is normal in structure. Tricuspid valve regurgitation was not visualized by color flow Doppler. 12. The aortic valve is normal in structure. Aortic valve regurgitation is mild by color flow Doppler. Mild aortic valve sclerosis without stenosis. 13. The pulmonic valve was normal in structure. Pulmonic valve regurgitation is trivial by color flow Doppler. 14. Mildly elevated pulmonary artery systolic pressure. 15. The inferior vena cava is normal in size with greater than 50% respiratory variability, suggesting right atrial pressure of 3 mmHg. 16. Rheumatic mitral valve stenosis. Transmitral gradient has increased. Consider repeat TEE and stress echo to evaluate transmitral gradient during exercise. May need a candidate for balloon valvuloplasty.  Patient Profile     72 12o. female with mild aortic regurgitation, moderate to severe mitral stenosis, Rheumatic mitral valve disease, chronic pericardial effusion, paroxysmal atrial flutter s/p  ablation 05/2017, and hypertension here with syncope.  Assessment & Plan    1. Mitral stenosis: patient presented with a syncopal episode. Echo reveled moderate to severe mitral stenosis, felt to be rheumatic mitral valve disease. She underwent TEE 10/17/2018 which confirmed moderate stenosis. [facility] 10/18/2018 showed normal coronary arteries with mild pulmHTN and moderate mitral stenosis. CT surgery consulted and recommended minimally invasive mitral valve replacement +/- MAZE given atrial fibrillation history. Patient wished to not have surgery this admission but would consider this in the not too distant future.  - Plan for close outpatient follow-up with [facility] for ongoing  discussions for mitral valve replacement  2. Persistent atrial fibrillation s/p ablation 2019: she has had 2 episodes of recurrent Afib since ablation. Carvedilol dose reduced this admission due to bradycardia. She has been maintained on a heparin gtt with plans to discontinue eliquis and start coumadin for valvular atrial fibrillation. Considering MAZE at the time of mitral valve replacement. - Continue carvedilol for rate control - Continue flecainide for rhythm control - Plan to start coumadin today with lovenox bridging - will arrange new patient coumadin clinic appt prior to discharge for early next week.    3. HTN: BP improved from yesterday after carvedilol dose reduced and losartan dose increased. - Continue current carvedilol, losartan, and hydrochlorothiazide   We will arrange outpatient follow-up with Cardiology for within the next 1-2 weeks.   For questions or updates, please contact [place] Please consult [email or web address] for contact info under Cardiology/STEMI.      Signed, Abigail Butts, PA-C  10/19/2018, 10:13 AM   [phone]

## 2018-10-19 NOTE — Telephone Encounter (Signed)
Shekeria, Osuna, RN    10/19/18 3:22 PM Note   ----- Message from Corinna Lines sent at 10/19/2018  2:58 PM EDT ----- Regarding: RE: TOC call  ----- Message ----- From: Abigail Butts., PA-C Sent: 10/19/2018  11:53 AM EDT To: [facility] Scheduling Subject: TOC call                                       Please place a TOC call to this patient. She is being discharged home today and has a follow-up visit with me on 11/02/2018. Thanks!

## 2018-10-19 NOTE — Telephone Encounter (Signed)
This encounter was created in error - please disregard.

## 2018-10-19 NOTE — Telephone Encounter (Signed)
-----   Message from Corinna Lines sent at 10/19/2018  2:58 PM EDT ----- Regarding: RE: [facility] call  ----- Message ----- From: Abigail Butts., PA-C Sent: 10/19/2018  11:53 AM EDT To: [facility] Scheduling Subject: TOC call                                       Please place a TOC call to this patient. She is being discharged home today and has a follow-up visit with me on 11/02/2018. Thanks!

## 2018-10-19 NOTE — Telephone Encounter (Signed)
-----   Message from Corinna Lines sent at 10/19/2018  2:58 PM EDT ----- Regarding: RE: [facility] call  ----- Message ----- From: Abigail Butts., PA-C Sent: 10/19/2018  11:53 AM EDT To: Cv Div N[facility] Scheduling Subject: TOC call                                       Please place a TOC call to this patient. She is being discharged home today and has a follow-up visit with me on 11/02/2018. Thanks!

## 2018-10-19 NOTE — Progress Notes (Signed)
ANTICOAGULATION CONSULT NOTE - Follow-Up Consult  Pharmacy Consult for : Heparin Indication: atrial fibrillation  Allergies  Allergen Reactions  . Amoxicillin Diarrhea    Has patient had a PCN reaction causing immediate rash, facial/tongue/throat swelling, SOB or lightheadedness with hypotension: no Has patient had a PCN reaction causing severe rash involving mucus membranes or skin necrosis: no Has patient had a PCN reaction that required hospitalization: no Has patient had a PCN reaction occurring within the last 10 years: no If all of the above answers are "NO", then may proceed with Cephalosporin use.   . Metoprolol Other (See Comments)    Mouth tastes like mold  . Metoprolol Tartrate     Other reaction(s): Other (See Comments) Mouth sores  . Oxaprozin Nausea Only    Patient Measurements: Height: 5' (152.4 cm) Weight: 153 lb 12.8 oz (69.8 kg) IBW/kg (Calculated) : 45.5 Heparin Dosing Weight: 60kg  Vital Signs: Temp: 99.7 F (37.6 C) (09/24 0443) Temp Source: Oral (09/24 0443) BP: 138/91 (09/24 0443) Pulse Rate: 107 (09/24 0443)  Labs: Recent Labs    10/17/18 0628 10/17/18 1657 10/18/18 0442  10/18/18 1049 10/18/18 1050 10/18/18 1709 10/19/18 0359  HGB 13.1  --  12.7   < > 12.6 12.2  --  11.8*  HCT 38.7  --  37.1   < > 37.0 36.0  --  36.2  PLT 161  --  176  --   --   --   --  189  APTT 145* 109* 87*  --   --   --   --  56*  HEPARINUNFRC 2.12*  --  1.03*  --   --   --   --  0.38  CREATININE 0.73  --  0.82  --   --   --  0.70  --    < > = values in this interval not displayed.    Estimated Creatinine Clearance: 53.8 mL/min (by C-G formula based on SCr of 0.7 mg/dL).   Medical History: Past Medical History:  Diagnosis Date  . Anxiety 06/29/2012  . Aortic regurgitation 08/13/2015  . Arthritis    "hands, wrists, back, feet, toes" (06/24/2017)  . Atrial flutter ([place]) 09/18/2015  . Atypical chest pain 05/13/2016  . Chest pain    remote cath in 1996 with NORMAL  coronaries noted  . CHF (congestive heart failure) ([place])   . Dyspnea 10/30/2010  . Essential hypertension 09/05/2013  . Exogenous obesity    history of   . GERD (gastroesophageal reflux disease)   . Glaucoma, both eyes   . Headache, migraine    "stopped in my 50's" (09/18/2015)  . History of cardiovascular stress test 2007   showing no ischemia  . Hypertension   . Mitral stenosis and aortic insufficiency 06/23/2010  . Mitral stenosis with insufficiency   . Murmur    of mitral stenosis and moderate aortic insufficiency      . Nausea 05/01/2018  . OSA on CPAP   . Palpitations    occasional  . Paroxysmal A-fib ([place]) 06/24/2017  . Pericardial effusion 09/18/2015  . Persistent atrial fibrillation 03/10/2017  . Personal history of rheumatic heart disease   . SBE (subacute bacterial endocarditis)    prophylaxis  . Sliding hiatal hernia   . Vertigo 05/01/2018   Assessment: 73 year old female with afib history, on Apixaban PTA. Last apixaban dose 9/21,  transitioned to heparin infusion due to procedures needed.  9/23 AM: aPTT 87 on heparin gtt 750 ut/hr.  CBC wnl/stable. Now s/p cardiac cath 9/23 found normal coronaries but severe mitral valve stenosis, plan for MV replacement.  Now with Valvular Afib w/ mitral valve disease. MD changing from apixaban to heparin bridge with eventual warfarin.  Post cath, pharmacy consulted to restart Heparin 8 hr post sheath removal. Sheath removed 11:49, no bleeding or hematoma noted.  Initial aPTT subtherapeutic at 56 seconds  Goal of Therapy:  Heparin level 0.3-0.7 units/ml aPTT 66-102s seconds Monitor platelets by anticoagulation protocol: Yes   Plan:  Increase heparin gtt to 850 units/hr F/u 6 hour aPTT/HL  Bertis Ruddy, PharmD Clinical Pharmacist Please check AMION for all [place] numbers 10/19/2018 5:44 AM

## 2018-10-19 NOTE — Evaluation (Signed)
Physical Therapy Evaluation/ Discharge Patient Details Name: Mary Farrell MRN: [id number] DOB: April 03, 1945 Today's Date: 10/19/2018   History of Present Illness  73 yo admitted after syncope at home.Cardiac cath 9/23 with mitral stenosis and regurgitation. PMhx: Aflutter, HTn, glaucoma, mitral stenosis  Clinical Impression  Pt pleasant and reports periods of ears ringing and fatigue at home without spinning sensation or true transitional movement change in sensation. Pt with HR 67-74 throughout session with SpO2 96% on RA after gait with pt reporting 2/4 DOE. Pt reports being relatively sedentary enjoying reading and playing piano and educated for walking program and progression with pt also educated for appropriate shoe wear as foot pain limits her tolerance for walking. Pt verbalized understanding of all and does not require assist for mobility other than to monitor lines. Pt without further need for therapy intervention with pt aware and agreeable, will sign off.      Follow Up Recommendations No PT follow up    Equipment Recommendations  None recommended by PT    Recommendations for Other Services       Precautions / Restrictions Precautions Precautions: None Restrictions Weight Bearing Restrictions: No      Mobility  Bed Mobility Overal bed mobility: Modified Independent                Transfers Overall transfer level: Modified independent                  Ambulation/Gait Ambulation/Gait assistance: Modified independent (Device/Increase time) Gait Distance (Feet): 600 Feet Assistive device: None Gait Pattern/deviations: Step-through pattern;Decreased stride length   Gait velocity interpretation: >4.37 ft/sec, indicative of normal walking speed General Gait Details: pt with 2 periods of veering right and pt reports this is baseline for her and she will at times steady herself on furniture without history of falls, Generally steady with good  speed  Stairs Stairs: Yes Stairs assistance: Modified independent (Device/Increase time) Stair Management: One rail Right;Alternating pattern;Forwards Number of Stairs: 3    Wheelchair Mobility    Modified Rankin (Stroke Patients Only)       Balance Overall balance assessment: Mild deficits observed, not formally tested                                           Pertinent Vitals/Pain Pain Assessment: No/denies pain    Home Living Family/patient expects to be discharged to:: Private residence Living Arrangements: Spouse/significant other Available Help at Discharge: Family;Available 24 hours/day Type of Home: House Home Access: Stairs to enter Entrance Stairs-Rails: Right Entrance Stairs-Number of Steps: 3 Home Layout: One level Home Equipment: None      Prior Function Level of Independence: Independent               Hand Dominance        Extremity/Trunk Assessment   Upper Extremity Assessment Upper Extremity Assessment: Overall WFL for tasks assessed    Lower Extremity Assessment Lower Extremity Assessment: Overall WFL for tasks assessed    Cervical / Trunk Assessment Cervical / Trunk Assessment: Normal  Communication   Communication: No difficulties  Cognition Arousal/Alertness: Awake/alert Behavior During Therapy: WFL for tasks assessed/performed Overall Cognitive Status: Within Functional Limits for tasks assessed  General Comments      Exercises     Assessment/Plan    PT Assessment Patent does not need any further PT services  PT Problem List         PT Treatment Interventions      PT Goals (Current goals can be found in the Care Plan section)  Acute Rehab PT Goals PT Goal Formulation: All assessment and education complete, DC therapy    Frequency     Barriers to discharge        Co-evaluation               AM-PAC PT "6 Clicks" Mobility   Outcome Measure Help needed turning from your back to your side while in a flat bed without using bedrails?: None Help needed moving from lying on your back to sitting on the side of a flat bed without using bedrails?: None Help needed moving to and from a bed to a chair (including a wheelchair)?: None Help needed standing up from a chair using your arms (e.g., wheelchair or bedside chair)?: None Help needed to walk in hospital room?: None Help needed climbing 3-5 steps with a railing? : None 6 Click Score: 24    End of Session Equipment Utilized During Treatment: Gait belt Activity Tolerance: Patient tolerated treatment well Patient left: in chair;with call bell/phone within reach Nurse Communication: Mobility status PT Visit Diagnosis: Other abnormalities of gait and mobility (R26.89)    Time: [id number] PT Time Calculation (min) (ACUTE ONLY): 22 min   Charges:   PT Evaluation $PT Eval Moderate Complexity: 1 Mod          [place] Pager: [phone] Office: [phone]   Sandy Salaam Chung [place]ag[place] 10/19/2018, 12:47 PM

## 2018-10-19 NOTE — Telephone Encounter (Signed)
-----   Message from Corinna Lines sent at 10/19/2018  2:58 PM EDT ----- Regarding: RE: [facility] call  ----- Message ----- From: Abigail Butts., PA-C Sent: 10/19/2018  11:53 AM EDT To: Cv Div Nl Scheduling Subject: TOC call                                       Please place a TOC call to this patient. She is being discharged home today and has a follow-up visit with me on 11/02/2018. Thanks!

## 2018-10-19 NOTE — Progress Notes (Signed)
PROGRESS NOTE    Mary Farrell  ASU:[id number] DOB: 04-28-1945 DOA: 10/15/2018 PCP: Lawerance Cruel, MD    Brief Narrative:   Mary Farrell is a 73 y.o. female with medical history significant of a. Fib s/p ablation procedure x 2, last in May '19. Last TEE 06/23/17 nl LV, mild decrease LV fxn, AV with mild eccentric AI, mild MR, LA enlarged, small ASD with L to R flow. She is followed [facility] with last visit 07/20/18. She has been in sinus rhythm but did have an episode of a fib lasting 18 hrs. Her CHA2DS2-VASc score is 3 and she takes Eliguis. For several days she has been having episodes of feeling light-headed, a flushed feeling that starts in legs and rises to her head, a sense that sounds are receeding and an awareness of her heart beat. She has taken her BP and reports a systolic pressure in the 43'E. Of note her dose or losartan was recently decreased from 138m to 50 mg.  She did send a cardiac tracing to her Cardiologist and had a undetermined rhythm. On the day of adm she was in the kitchen, felt light headed and woozy. The next thing she remembers is being awakened by her husband. Length of LOC less than two minutes. Her husband reports that he saw she was falling and got to her before she actually fell and eased her down. By his report she appeared very pale with bruised appearing lips during the episode. He did not witness any abnormal movement. She was confused for a minute or two but rapidly regained full consciousness. She believes she had moved several feet to the side and fell backward. She hit her head but was not injured. After regaining full consciousness she called cardiology on call and was referred to ED.  Hemodynamically stable in ED. She was awake and alert. CT head and neck without injury or abnormality. EKG with sinus brady, RBBB. Cardiology was consulted and telemetry admission was recommended along with 2D echo. TRH called to admit.    Assessment & Plan:    Principal Problem:   Syncope Active Problems:   Mitral stenosis and aortic insufficiency   GERD (gastroesophageal reflux disease)   Anxiety   Essential hypertension   Pericardial effusion   Persistent atrial fibrillation   Hypokalemia   Severe mitral regurgitation   Syncope Patient presenting with intermittent episodes of dizziness/lightheadedness and flushing over the past few weeks.  CT head without acute intracranial findings.  No signs of active infection.  No metabolic or electrolyte derangements.  EKG notable for sinus bradycardia with first-degree AV block, right axis, incomplete RBBB.  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  Suspect etiology from her underlying valvular heart disease.  Mitral stenosis, severe Rheumatic mitral valve disease Leaflets look thicker on recent TTE, with concern of a possible thrombus given the hyperechoic density noted.  TEE noted calcified fixed nodule, and her findings of moderate/severe elevated pulmonary pressures along with A. fib and dilated LA warrant mitral valve replacement. R/L heart catheterization on 9/23 with normal coronary arteries, mild pulmonary hypertension and severe mitral stenosis. TCTS was consulted with recommendations of mitral valve replacement with possible MAZE procedure from underlying A. fib.  Patient currently deferring surgical intervention this hospitalization, and will follow-up outpatient.  Patient will transition from heparin drip to Coumadin with a Lovenox bridge today.  Cardiology arranging outpatient Coumadin clinic follow-up early next week.  Persistent atrial  fibrillation Patient has undergone multiple cardioversions in the past, ablation on 2 occasions and was previously maintained normal sinus rhythm but now back in atrial fibrillation.  Was on Eliquis for chronic anticoagulation, but now will change to Coumadin given likely rheumatic valvular disease/A.  Fib.  Transitioning from heparin drip to Coumadin with Lovenox bridge today.  Follow-up outpatient cardiothoracic surgery for consideration of MVR as above as well as MAZE procedure. --Continue rate control with carvedilol 3.125 mg p.o. twice daily --Flecainide 100 mg p.o. twice daily --Pharmacy consulted for dosing/monitoring Coumadin --Daily INR  Essential hypertension BP 135/91 this morning. --Continue carvedilol 3.125 mg p.o. twice daily, hydrochlorothiazide 12.5 mg p.o. daily, losartan 50 mg p.o. daily. --Continue to monitor blood pressure closely  Anxiety: Doxepin qHS prn, alprazolam 0.125 mg p.o. twice daily as needed  GERD: Continue PPI and famotidine   DVT prophylaxis: coumadin with Lovenox bridge Code Status: Full code Family Communication: None present at bedside Disposition Plan: Continue inpatient hospitalization, transition from heparin drip to Coumadin with Lovenox bridge today, likely discharge home tomorrow   Consultants:   Cardiology  TCTS  Procedures:  Transthoracic echocardiogram 10/16/2018: IMPRESSIONS    1. Left ventricular ejection fraction, by visual estimation, is 60 to 65%. The left ventricle has normal function. Normal left ventricular size. There is no left ventricular hypertrophy.  2. Global right ventricle has normal systolic function.The right ventricular size is normal. No increase in right ventricular wall thickness.  3. Left atrial size was severely dilated.  4. Right atrial size was normal.  5. Small pericardial effusion.  6. Mild calcification of the posterior mitral valve leaflet(s).  7. Moderate thickening of the posterior mitral valve leaflet(s).  8. Moderately decreased mobility of the mitral valve leaflets.  9. The mitral valve is rheumatic. Mild mitral valve regurgitation. Moderate-severe mitral stenosis. 10. Mean transmitral gradient is 57mHg.     Viewed personally TEE from 2019 and value leaflets appear thin and non calcified.  Harmonics on current echocardiogram may be causing artifactual thickening. 11. The tricuspid valve is normal in structure. Tricuspid valve regurgitation was not visualized by color flow Doppler. 12. The aortic valve is normal in structure. Aortic valve regurgitation is mild by color flow Doppler. Mild aortic valve sclerosis without stenosis. 13. The pulmonic valve was normal in structure. Pulmonic valve regurgitation is trivial by color flow Doppler. 14. Mildly elevated pulmonary artery systolic pressure. 15. The inferior vena cava is normal in size with greater than 50% respiratory variability, suggesting right atrial pressure of 3 mmHg. 16. Rheumatic mitral valve stenosis. Transmitral gradient has increased. Consider repeat TEE and stress echo to evaluate transmitral gradient during exercise. May need a candidate for balloon valvuloplasty.  Transesophageal echocardiogram 10/17/2018: IMPRESSIONS    1. Left ventricular ejection fraction, by visual estimation, is 55 to 60%. The left ventricle has normal function. Normal left ventricular size. There is no left ventricular hypertrophy.  2. Global right ventricle has normal systolic function.The right ventricular size is normal. No increase in right ventricular wall thickness.  3. Pulmonary hypertension.  4. Left atrial size was severely dilated.  5. No LAA thrombus.  6. Right atrial size was normal.  7. The mitral valve is rheumatic. Mild mitral valve regurgitation. Moderate-severe mitral stenosis. moderate mitral valve stenosis.  8. Moderate stenosis by pressure half time 1.2cm squared and mean gradient (833mg). PISA radius 1.2cm, with calculated area of 1.2 cm squared.     Valve leaflets are thin, pliable, no subvalvular thickening, however surrounding P1 (posterior  leaflet) there is a calcified fixed nodule (MAC like appearance).  9. The tricuspid valve is normal in structure. Tricuspid valve regurgitation is mild. 10. The aortic valve is  tricuspid Aortic valve regurgitation is mild by color flow Doppler. Mild aortic valve sclerosis without stenosis. 11. The pulmonic valve was normal in structure. Pulmonic valve regurgitation is not visualized by color flow Doppler. 12. Moderately elevated pulmonary artery systolic pressure. 13. The inferior vena cava is normal in size with greater than 50% respiratory variability, suggesting right atrial pressure of 3 mmHg. 14. Consider stress echo with transmitral gradient measurements. 15. Her moderate to severely elevated pulmonary pressures may indicate along with AFIB and markedly dilated LA along with symptoms, that mitral valve replacement is indicated. 16. However pulmonary pressures are high (60-19mHg). Although leaflets are thin, pliable and without subvalvular thickening, she does have a calcified density on P1 which would likely increase risk with balloon valvuloplasty. 17. Rheumatic moderate mitral stenosis.  Right/Left heart catheterization 10/18/2018:  Antimicrobials:  none   Subjective: Patient seen and examined at bedside, resting comfortably in bed.  No specific complaints this morning.  Wishes to defer surgical intervention during this hospitalization and will follow-up outpatient.  Denies headache, no fever/chills/night sweats, no chest pain, no palpitations, no shortness of breath, no abdominal pain, no weakness, no fatigue.  No acute events overnight per nurse staff.  Objective: Vitals:   10/18/18 2350 10/19/18 0443 10/19/18 0527 10/19/18 0914  BP:  (!) 138/91  (!) 121/53  Pulse: 70 (!) 107  73  Resp: _0 Temp:  99.7 F (37.6 C)  98.1 F (36.7 C)  TempSrc:  Oral  Oral  SpO2: 98% (!) 78%    Weight:   69.8 kg   Height:        Intake/Output Summary (Last 24 hours) at 10/19/2018 1136 Last data filed at 10/19/2018 0850 Gross per 24 hour  Intake 985.82 ml  Output 1050 ml  Net -64.18 ml   Filed Weights   10/17/18 0051 10/18/18 0540 10/19/18 0527  Weight:  69.3 kg 69.7 kg 69.8 kg    Examination:  General exam: Appears calm and comfortable  Respiratory system: Clear to auscultation. Respiratory effort normal. Cardiovascular system: S1 & S2 heard, RRR. No JVD, 3/6 diastolic murmur left lower sternal border, rubs, gallops or clicks. No pedal edema. Gastrointestinal system: Abdomen is nondistended, soft and nontender. No organomegaly or masses felt. Normal bowel sounds heard. Central nervous system: Alert and oriented. No focal neurological deficits. Extremities: Symmetric 5 x 5 power. Skin: No rashes, lesions or ulcers Psychiatry: Judgement and insight appear normal. Mood & affect appropriate.  Mildly anxious    Data Reviewed: I have personally reviewed following labs and imaging studies  CBC: Recent Labs  Lab 10/15/18 1259 10/17/18 0628 10/18/18 0442 10/18/18 1044 10/18/18 1049 10/18/18 1050 10/19/18 0359  WBC 6.2 5.5 6.4  --   --   --  7.2  HGB 13.6 13.1 12.7 12.6 12.6 12.2 11.8*  HCT 42.2 38.7 37.1 37.0 37.0 36.0 36.2  MCV 98.6 96.5 96.1  --   --   --  97.8  PLT 206 161 176  --   --   --  1829  Basic Metabolic Panel: Recent Labs  Lab 10/15/18 1259 10/16/18 0233 10/17/18 0628 10/18/18 0442 10/18/18 1044 10/18/18 1049 10/18/18 1050 10/18/18 1709  NA 135 136 138 136 141 141 142 139  K 4.1 3.4* 3.6 3.7 4.0 4.0 3.9 3.5  CL  97* 100 102 101  --   --   --  105  CO2 _0 --   --   --  27  GLUCOSE 124* 112* 91 110*  --   --   --  115*  BUN _1 --   --   --  7*  CREATININE 0.88 0.81 0.73 0.82  --   --   --  0.70  CALCIUM 9.5 8.9 8.5* 8.5*  --   --   --  8.3*   GFR: Estimated Creatinine Clearance: 53.8 mL/min (by C-G formula based on SCr of 0.7 mg/dL). Liver Function Tests: Recent Labs  Lab 10/15/18 1259  AST 21  ALT 19  ALKPHOS 60  BILITOT 1.0  PROT 6.6  ALBUMIN 3.8   No results for input(s): LIPASE, AMYLASE in the last 168 hours. No results for input(s): AMMONIA in the last 168  hours. Coagulation Profile: No results for input(s): INR, PROTIME in the last 168 hours. Cardiac Enzymes: No results for input(s): CKTOTAL, CKMB, CKMBINDEX, TROPONINI in the last 168 hours. BNP (last 3 results) No results for input(s): PROBNP in the last 8760 hours. HbA1C: Recent Labs    10/18/18 0442  HGBA1C 5.2   CBG: Recent Labs  Lab 10/15/18 1257 10/17/18 0654 10/18/18 0503 10/19/18 0612  GLUCAP 122* 97 113* 96   Lipid Profile: No results for input(s): CHOL, HDL, LDLCALC, TRIG, CHOLHDL, LDLDIRECT in the last 72 hours. Thyroid Function Tests: No results for input(s): TSH, T4TOTAL, FREET4, T3FREE, THYROIDAB in the last 72 hours. Anemia Panel: No results for input(s): VITAMINB12, FOLATE, FERRITIN, TIBC, IRON, RETICCTPCT in the last 72 hours. Sepsis Labs: No results for input(s): PROCALCITON, LATICACIDVEN in the last 168 hours.  Recent Results (from the past 240 hour(s))  SARS CORONAVIRUS 2 (TAT 6-24 HRS) Nasopharyngeal Nasopharyngeal Swab     Status: None   Collection Time: 10/15/18  6:25 PM   Specimen: Nasopharyngeal Swab  Result Value Ref Range Status   SARS Coronavirus 2 NEGATIVE NEGATIVE Final    Comment: (NOTE) SARS-CoV-2 target nucleic acids are NOT DETECTED. The SARS-CoV-2 RNA is generally detectable in upper and lower respiratory specimens during the acute phase of infection. Negative results do not preclude SARS-CoV-2 infection, do not rule out co-infections with other pathogens, and should not be used as the sole basis for treatment or other patient management decisions. Negative results must be combined with clinical observations, patient history, and epidemiological information. The expected result is Negative. Fact Sheet for Patients: SugarRoll.be Fact Sheet for Healthcare Providers: https://www.woods-mathews.com/ This test is not yet approved or cleared by the [place] FDA and  has been authorized for  detection and/or diagnosis of SARS-CoV-2 by FDA under an Emergency Use Authorization (EUA). This EUA will remain  in effect (meaning this test can be used) for the duration of the COVID-19 declaration under Section 56 4(b)(1) of the Act, 21 U.S.C. section 360bbb-3(b)(1), unless the authorization is terminated or revoked sooner. Performed at [facility], [place] [address] [place], [place] [address]          Radiology Studies: No results found.      Scheduled Meds:  carvedilol  3.125 mg Oral BID WC   enoxaparin (LOVENOX) injection  70 mg Subcutaneous Q12H   famotidine  20 mg Oral QHS   flecainide  100 mg Oral BID   gabapentin  300 mg Oral TID   hydrochlorothiazide  12.5 mg Oral Daily  loratadine  10 mg Oral Daily   losartan  50 mg Oral Daily   oxybutynin  10 mg Oral Daily   pantoprazole  40 mg Oral Daily   sodium chloride flush  3 mL Intravenous Once   sodium chloride flush  3 mL Intravenous Q12H   sodium chloride flush  3 mL Intravenous Q12H   sodium chloride flush  3 mL Intravenous Q12H   sodium chloride flush  3 mL Intravenous Q12H   Warfarin - Pharmacist Dosing Inpatient   Does not apply q1800   Continuous Infusions:  sodium chloride     sodium chloride       LOS: 2 days    Time spent: 34 minutes spent on chart review, discussion with nursing staff, consultants, updating family and interview/physical exam; more than 50% of that time was spent in counseling and/or coordination of care.    Kynley Metzger J [place] DO [facility] Pager [phone]  If 7PM-7AM, please contact night-coverage [email or web address] Password [facility] 10/19/2018, 11:36 AM

## 2018-10-19 NOTE — Progress Notes (Signed)
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 Amoxicillin Diarrhea    Has patient had a PCN reaction causing immediate rash, facial/tongue/throat swelling, SOB or lightheadedness with hypotension: no Has patient had a PCN reaction causing severe rash involving mucus membranes or skin necrosis: no Has patient had a PCN reaction that required hospitalization: no Has patient had a PCN reaction occurring within the last 10 years: no If all of the above answers are "NO", then may proceed with Cephalosporin use.   . Metoprolol Other (See Comments)    Mouth tastes like mold  . Metoprolol Tartrate     Other reaction(s): Other (See Comments) Mouth sores  . Oxaprozin Nausea Only    Patient Measurements: Height: 5' (152.4 cm) Weight: 153 lb 12.8 oz (69.8 kg) IBW/kg (Calculated) : 45.5 Heparin Dosing Weight: 60kg  Vital Signs: Temp: 98.1 F (36.7 C) (09/24 0914) Temp Source: Oral (09/24 0914) BP: 121/53 (09/24 0914) Pulse Rate: 73 (09/24 0914)  Labs: Recent Labs    10/17/18 0628 10/17/18 1657 10/18/18 0442  10/18/18 1049 10/18/18 1050 10/18/18 1709 10/19/18 0359  HGB 13.1  --  12.7   < > 12.6 12.2  --  11.8*  HCT 38.7  --  37.1   < > 37.0 36.0  --  36.2  PLT 161  --  176  --   --   --   --  189  APTT 145* 109* 87*  --   --   --   --  56*  HEPARINUNFRC 2.12*  --  1.03*  --   --   --   --  0.38  CREATININE 0.73  --  0.82  --   --   --  0.70  --    < > = values in this interval not displayed.    Estimated Creatinine Clearance: 53.8 mL/min (by C-G formula based on SCr of 0.7 mg/dL).   Medical History: Past Medical History:  Diagnosis Date  . Anxiety 06/29/2012  . Aortic regurgitation 08/13/2015  . Arthritis    "hands, wrists, back, feet, toes" (06/24/2017)  . Atrial flutter ([place]) 09/18/2015  . Atypical chest pain 05/13/2016   . Chest pain    remote cath in 1996 with NORMAL coronaries noted  . CHF (congestive heart failure) ([place])   . Dyspnea 10/30/2010  . Essential hypertension 09/05/2013  . Exogenous obesity    history of   . GERD (gastroesophageal reflux disease)   . Glaucoma, both eyes   . Headache, migraine    "stopped in my 50's" (09/18/2015)  . History of cardiovascular stress test 2007   showing no ischemia  . Hypertension   . Mitral stenosis and aortic insufficiency 06/23/2010  . Mitral stenosis with insufficiency   . Murmur    of mitral stenosis and moderate aortic insufficiency      . Nausea 05/01/2018  . OSA on CPAP   . Palpitations    occasional  . Paroxysmal A-fib ([place]) 06/24/2017  . Pericardial effusion 09/18/2015  . Persistent atrial fibrillation 03/10/2017  . Personal history of rheumatic heart disease   . SBE (subacute bacterial endocarditis)    prophylaxis  . Sliding hiatal hernia   . Vertigo 05/01/2018   Assessment: 73 year old female with afib history, on Apixaban PTA. Last apixaban dose 9/21 AM in hospital , then transitioned to heparin infusion due to  procedures needed.  S/p Cardiac Cath 9/23 found nomal coronaries but severe mitral valve stenosis, plan for MV replacement.  IV heparin drip resumed 9/23 8 hours post cath.  With Valvular Afib w/ mitral valve disease, MD is changing from apixaban PTA to Warfarin. Pharmacy consulted today 10/19/18 to stop IV heparin and switch to Lovenox bridge to Warfarin. CBC stable, slight decr hgb 11.8, pltc wnl stable No bleeding or hematoma.  Warfarin predictor points = 2 points  Goal of Therapy:  Heparin level 0.3-0.7 units/ml aPTT 66-102s seconds Monitor platelets by anticoagulation protocol: Yes   Plan:  Discontinue IV heparin drip now, and 1 hour later start Lovenox 70 mg sq q12h (1mg /kg q12h) Check Baseline INR Give warfarin 5 mg po today x1  Monitor daily Protime/INR and monitor CBC q72h Cards PA noted she will arrange new patient  coumadin clinic appt for early next week   Nicole Cella, RPh Clinical Pharmacist [phone] Please check AMION for all [place] numbers 10/19/2018 10:06 AM

## 2018-10-20 LAB — PROTIME-INR
INR: 1.3 — ABNORMAL HIGH (ref 0.8–1.2)
Prothrombin Time: 16.4 seconds — ABNORMAL HIGH (ref 11.4–15.2)

## 2018-10-20 MED ORDER — COUMADIN BOOK
Freq: Once | Status: AC
  Administered 2018-10-20: 12:00:00
  Filled 2018-10-20: qty 1

## 2018-10-20 MED ORDER — WARFARIN SODIUM 5 MG PO TABS: 5.0000 mg | ORAL_TABLET | Freq: Every day | ORAL | 0 refills | Status: DC

## 2018-10-20 MED ORDER — ENOXAPARIN SODIUM 80 MG/0.8ML ~~LOC~~ SOLN: 70.0000 mg | Freq: Two times a day (BID) | SUBCUTANEOUS | 0 refills | Status: DC

## 2018-10-20 MED ORDER — WARFARIN VIDEO
Freq: Once | Status: AC
  Administered 2018-10-20: 12:00:00

## 2018-10-20 MED ORDER — CARVEDILOL 3.125 MG PO TABS: 3.1250 mg | ORAL_TABLET | Freq: Two times a day (BID) | ORAL | 0 refills | Status: DC

## 2018-10-20 MED FILL — WARFARIN SODIUM 5 MG TABLET: 5 | 30 days supply | Qty: 30 | Fill #0

## 2018-10-20 MED FILL — ENOXAPARIN 80 MG/0.8 ML SYR: 80 | 8 days supply | Qty: 6 | Fill #0

## 2018-10-20 MED FILL — CARVEDILOL 3.125 MG TABLET: 3.125 | 30 days supply | Qty: 60 | Fill #0

## 2018-10-20 NOTE — Discharge Summary (Signed)
Physician Discharge Summary  Mary Farrell ERD:[id number] DOB: Apr 07, 1945 DOA: 10/15/2018  PCP: Lawerance Cruel, MD  Admit date: 10/15/2018 Discharge date: 10/20/2018  Admitted From: Home Disposition:  Home  Recommendations for Outpatient Follow-up:  1. Follow up with PCP in 1 week 2. Follow-up with cardiology and cardiothoracic surgery as scheduled 3. Please obtain BMP/CBC in one week 4. Please follow up on the following pending results:  Home Health: no Equipment/Devices: none  Discharge Condition: Stable  CODE STATUS: Full code Diet recommendation: Heart Healthy   History of present illness:  Mary Farrell a 73 y.o.femalewith medical history significant ofa. Fib s/p ablation procedure x 2, last in May '19. Last TEE 06/23/17 nl LV, mild decrease LV fxn, AV with mild eccentric AI, mild MR, LA enlarged, small ASD with L to R flow. She is followed [facility] with last visit 07/20/18. She has been in sinus rhythm but did have an episode of a fib lasting 18 hrs. Her CHA2DS2-VASc score is 3 and she takes Eliguis. For several days she has been havingepisodes of feelinglight-headed, a flushed feeling that starts in legs and rises to her head, a sense that sounds are receeding and an awareness of her heart beat.She has taken her BP and reports a systolic pressure in the 56'D. Of note her dose or losartan was recently decreased from 123m to 50 mg.She did send a cardiac tracing to her Cardiologist and had a undetermined rhythm. On the day of adm she was in the kitchen, felt light headed and woozy. The next thing she remembers is being awakened by her husband. Length of LOC less than two minutes.Her husband reports that he saw she was falling and got to her before she actually fell and eased her down. By his report she appeared very pale with bruised appearing lips during the episode. He did not witness any abnormal movement. She was confused for a minute or two but rapidly regained  full consciousness.Shebelieves she had moved several feet to the side and fell backward. She hit her head butwas not injured.After regaining full consciousness she called cardiology on call and was referred to ED.  Hemodynamically stable in ED. She was awake and alert. CT head and neck withoutinjury or abnormality. EKG with sinus brady, RBBB. Cardiology was consulted and telemetry admission was recommended along with 2D echo. [facility] called to admit.  Hospital course:  Syncope Patient presenting with intermittent episodes of dizziness/lightheadedness and flushing over the past few weeks.  CT head without acute intracranial findings.  No signs of active infection.  No metabolic or electrolyte derangements.  EKG notable for sinus bradycardia with first-degree AV block, right axis, incomplete RBBB.  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  Suspect etiology from her underlying valvular heart disease.  Mitral stenosis, severe Rheumatic mitral valve disease Leaflets look thicker on recent TTE, with concern of a possible thrombus given the hyperechoic density noted.  TEE noted calcified fixed nodule, and her findings of moderate/severe elevated pulmonary pressures along with A. fib and dilated LA warrant mitral valve replacement. R/L heart catheterization on 9/23 with normal coronary arteries, mild pulmonary hypertension and severe mitral stenosis. TCTS was consulted with recommendations of mitral valve replacement with possible MAZE procedure from underlying A. fib.  Patient currently deferring surgical intervention this hospitalization, and will follow-up outpatient.  Patient will transitioned from heparin drip to Coumadin with a Lovenox bridge. INR 1.3 at time of discharge Cardiology arranging  outpatient Coumadin clinic follow-up early next week; scheduled on Monday, 10/23/2018 at 2:45 PM.  Persistent atrial fibrillation Patient has undergone  multiple cardioversions in the past, ablation on 2 occasions and was previously maintained normal sinus rhythm but now back in atrial fibrillation.  Was on Eliquis for chronic anticoagulation, but now will change to Coumadin given likely rheumatic valvular disease/A. Fib.  Transitioning from heparin drip to Coumadin with Lovenox bridge today.  Follow-up outpatient cardiothoracic surgery for consideration of MVR as above as well as MAZE procedure. Continue rate control with carvedilol 3.125 mg p.o. twice daily, Flecainide 100 mg p.o. twice daily.  Discontinued home Eliquis in favor of Coumadin secondary to rheumatic valvular A. fib.  Patient to follow-up with Coumadin clinic on 10/23/2018 at 2:45 PM  Essential hypertension Continue carvedilol 3.125 mg p.o. twice daily, hydrochlorothiazide 12.5 mg p.o. daily, losartan 50 mg p.o. daily.  Anxiety: Doxepin qHS prn, alprazolam 0.125 mg p.o. twice daily as needed  GERD: Continue PPI and famotidine  Discharge Diagnoses:  Active Problems:   Mitral stenosis and aortic insufficiency   GERD (gastroesophageal reflux disease)   Anxiety   Essential hypertension   Pericardial effusion   Persistent atrial fibrillation   Severe mitral regurgitation    Discharge Instructions  Discharge Instructions    Call MD for:  difficulty breathing, headache or visual disturbances   Complete by: As directed    Call MD for:  extreme fatigue   Complete by: As directed    Call MD for:  persistant dizziness or light-headedness   Complete by: As directed    Call MD for:  persistant nausea and vomiting   Complete by: As directed    Call MD for:  severe uncontrolled pain   Complete by: As directed    Call MD for:  temperature >100.4   Complete by: As directed    Diet - low sodium heart healthy   Complete by: As directed    Increase activity slowly   Complete by: As directed      Allergies as of 10/20/2018      Reactions   Amoxicillin Diarrhea   Has patient  had a PCN reaction causing immediate rash, facial/tongue/throat swelling, SOB or lightheadedness with hypotension: no Has patient had a PCN reaction causing severe rash involving mucus membranes or skin necrosis: no Has patient had a PCN reaction that required hospitalization: no Has patient had a PCN reaction occurring within the last 10 years: no If all of the above answers are "NO", then may proceed with Cephalosporin use.   Metoprolol Other (See Comments)   Mouth tastes like mold   Metoprolol Tartrate    Other reaction(s): Other (See Comments) Mouth sores   Oxaprozin Nausea Only      Medication List    STOP taking these medications   Eliquis 5 MG Tabs tablet Generic drug: apixaban     TAKE these medications   acetaminophen 500 MG tablet Commonly known as: TYLENOL Take 500 mg by mouth every 6 (six) hours as needed for moderate pain or headache.   ALPRAZolam 0.25 MG tablet Commonly known as: XANAX Take 0.125 mg by mouth 2 (two) times daily as needed for anxiety.   carvedilol 3.125 MG tablet Commonly known as: COREG Take 1 tablet (3.125 mg total) by mouth 2 (two) times daily with a meal. What changed:   medication strength  how much to take   cetirizine 10 MG tablet Commonly known as: ZYRTEC Take 10 mg by mouth at  bedtime.   conjugated estrogens vaginal cream Commonly known as: PREMARIN Place 1 Applicatorful vaginally daily as needed (irritation).   dorzolamide-timolol 22.3-6.8 MG/ML ophthalmic solution Commonly known as: COSOPT Place 1 drop into both eyes daily.   doxepin 10 MG capsule Commonly known as: SINEQUAN Take 10-20 mg by mouth at bedtime as needed (sleep).   enoxaparin 80 MG/0.8ML injection Commonly known as: LOVENOX Inject 0.7 mLs (70 mg total) into the skin every 12 (twelve) hours for 4 days.   famotidine 20 MG tablet Commonly known as: PEPCID Take 20 mg by mouth at bedtime.   flecainide 50 MG tablet Commonly known as: TAMBOCOR TAKE 2  TABLETS BY MOUTH TWO TIMES DAILY What changed: See the new instructions.   gabapentin 300 MG capsule Commonly known as: NEURONTIN Take 300 mg by mouth 3 (three) times daily.   hydrochlorothiazide 12.5 MG capsule Commonly known as: MICROZIDE TAKE 1 CAPSULE BY MOUTH  DAILY   lactobacillus acidophilus Tabs tablet Take 1 tablet by mouth daily.   latanoprost 0.005 % ophthalmic solution Commonly known as: XALATAN Place 1 drop into both eyes at bedtime.   losartan 50 MG tablet Commonly known as: COZAAR Take 50 mg by mouth daily.   multivitamin with minerals Tabs tablet Take 1 tablet by mouth daily.   NON FORMULARY Apply 1 application topically 2 (two) times daily as needed (for eczema). Traimcinolone/[employer] Moist Cream   omeprazole 40 MG capsule Commonly known as: PRILOSEC Take 40 mg by mouth daily.   oxybutynin 10 MG 24 hr tablet Commonly known as: DITROPAN-XL Take 10 mg by mouth daily.   polyethylene glycol 17 g packet Commonly known as: MIRALAX / GLYCOLAX Take 17 g by mouth See admin instructions. 3-4 times a week   PRESCRIPTION MEDICATION Inhale into the lungs at bedtime. CPAP   Vitamin D 50 MCG (2000 UT) Caps Take 2,000 Units by mouth daily.   warfarin 5 MG tablet Commonly known as: Coumadin Take 1 tablet (5 mg total) by mouth daily at 6 PM for 3 days.      Follow-up Information    Lawerance Cruel, MD. Go on 11/03/2018.   Specialty: Family Medicine Why: _0 :45pm Contact information: [place] [address] [phone]        [facility] Follow up on 10/23/2018.   Specialty: Cardiology Why: Please arrive 15 minutes early for your 2:45pm coumadin clinic appointment. You will have your INR checked at that time and will receive recommendations for dosing going forward.  Contact information: [address] [place] [phone]       Abigail Butts., PA-C Follow up on 11/02/2018.    Specialty: Physician Assistant Why: Please arrive 15 minutes early for your 8:45am post-hospital cardiology follow-up appointment Contact information: [address] [place] [address] [place]  [phone]          Allergies  Allergen Reactions  . Amoxicillin Diarrhea    Has patient had a PCN reaction causing immediate rash, facial/tongue/throat swelling, SOB or lightheadedness with hypotension: no Has patient had a PCN reaction causing severe rash involving mucus membranes or skin necrosis: no Has patient had a PCN reaction that required hospitalization: no Has patient had a PCN reaction occurring within the last 10 years: no If all of the above answers are "NO", then may proceed with Cephalosporin use.   . Metoprolol Other (See Comments)    Mouth tastes like mold  . Metoprolol Tartrate  Other reaction(s): Other (See Comments) Mouth sores  . Oxaprozin Nausea Only    Consultations:  Cardiology  Cardiothoracic surgery   Procedures/Studies: Dg Chest 2 View  Result Date: 10/15/2018 CLINICAL DATA:  Chest pain EXAM: CHEST - 2 VIEW COMPARISON:  May 15, 2017 FINDINGS: There is cardiomegaly. Both lungs are clear. The visualized skeletal structures are unremarkable. IMPRESSION: No active cardiopulmonary disease.  Cardiomegaly Electronically Signed   By: Prudencio Pair M.D.   On: 10/15/2018 15:39   Ct Head Wo Contrast  Result Date: 10/15/2018 CLINICAL DATA:  Syncope, right-sided scalp hematoma EXAM: CT HEAD WITHOUT CONTRAST CT CERVICAL SPINE WITHOUT CONTRAST TECHNIQUE: Multidetector CT imaging of the head and cervical spine was performed following the standard protocol without intravenous contrast. Multiplanar CT image reconstructions of the cervical spine were also generated. COMPARISON:  None. FINDINGS: CT HEAD FINDINGS Brain: No evidence of acute infarction, hemorrhage, hydrocephalus, extra-axial collection or mass lesion/mass effect. Vascular: No hyperdense vessel or  unexpected calcification. Skull: Normal. Negative for fracture or focal lesion. Sinuses/Orbits: No acute finding. Other: None. CT CERVICAL SPINE FINDINGS Alignment: 3 mm anterolisthesis C2 on C3. Facet joints are aligned without dislocation. Skull base and vertebrae: No acute fracture. No primary bone lesion or focal pathologic process. Soft tissues and spinal canal: No prevertebral fluid or swelling. No visible canal hematoma. Disc levels: Multilevel intervertebral disc height loss with degenerative endplate spurring, most pronounced at C5-6 and C6-7. Multilevel mild facet and uncovertebral arthropathy. No evidence of high-grade canal stenosis. Upper chest: Negative. Other: None. IMPRESSION: 1. No acute intracranial findings. 2. No acute fracture or dislocation of the cervical spine. 3. Mild-moderate multilevel degenerative changes of the cervical spine. Electronically Signed   By: Davina Poke M.D.   On: 10/15/2018 15:31   Ct Cervical Spine Wo Contrast  Result Date: 10/15/2018 CLINICAL DATA:  Syncope, right-sided scalp hematoma EXAM: CT HEAD WITHOUT CONTRAST CT CERVICAL SPINE WITHOUT CONTRAST TECHNIQUE: Multidetector CT imaging of the head and cervical spine was performed following the standard protocol without intravenous contrast. Multiplanar CT image reconstructions of the cervical spine were also generated. COMPARISON:  None. FINDINGS: CT HEAD FINDINGS Brain: No evidence of acute infarction, hemorrhage, hydrocephalus, extra-axial collection or mass lesion/mass effect. Vascular: No hyperdense vessel or unexpected calcification. Skull: Normal. Negative for fracture or focal lesion. Sinuses/Orbits: No acute finding. Other: None. CT CERVICAL SPINE FINDINGS Alignment: 3 mm anterolisthesis C2 on C3. Facet joints are aligned without dislocation. Skull base and vertebrae: No acute fracture. No primary bone lesion or focal pathologic process. Soft tissues and spinal canal: No prevertebral fluid or swelling. No  visible canal hematoma. Disc levels: Multilevel intervertebral disc height loss with degenerative endplate spurring, most pronounced at C5-6 and C6-7. Multilevel mild facet and uncovertebral arthropathy. No evidence of high-grade canal stenosis. Upper chest: Negative. Other: None. IMPRESSION: 1. No acute intracranial findings. 2. No acute fracture or dislocation of the cervical spine. 3. Mild-moderate multilevel degenerative changes of the cervical spine. Electronically Signed   By: Davina Poke M.D.   On: 10/15/2018 15:31   Vas US Carotid  Result Date: 10/16/2018 Carotid Arterial Duplex Study Indications:       Syncope. Risk Factors:      Hypertension. Comparison Study:  No prior study. Performing Technologist: Maudry Mayhew MHA, RDMS, RVT, RDCS  Examination Guidelines: A complete evaluation includes B-mode imaging, spectral Doppler, color Doppler, and power Doppler as needed of all accessible portions of each vessel. Bilateral testing is considered an integral part of a complete examination.  Limited examinations for reoccurring indications may be performed as noted.  Right Carotid Findings: +----------+--------+--------+--------+--------------------------+--------+           PSV cm/sEDV cm/sStenosisPlaque Description        Comments +----------+--------+--------+--------+--------------------------+--------+ CCA Prox  56      12              smooth and heterogenous            +----------+--------+--------+--------+--------------------------+--------+ CCA Distal73      17              heterogenous and smooth            +----------+--------+--------+--------+--------------------------+--------+ ICA Prox  75      14                                                 +----------+--------+--------+--------+--------------------------+--------+ ICA Distal185     17                                                  +----------+--------+--------+--------+--------------------------+--------+ ECA       67      6               irregular and heterogenous         +----------+--------+--------+--------+--------------------------+--------+ +----------+--------+-------+----------------+-------------------+           PSV cm/sEDV cmsDescribe        Arm Pressure (mmHG) +----------+--------+-------+----------------+-------------------+ [id number]            Multiphasic, WNL                    +----------+--------+-------+----------------+-------------------+ +---------+--------+--+--------+-+---------+ VertebralPSV cm/s20EDV cm/s5Antegrade +---------+--------+--+--------+-+---------+  Left Carotid Findings: +----------+--------+--------+--------+-----------------------+--------+           PSV cm/sEDV cm/sStenosisPlaque Description     Comments +----------+--------+--------+--------+-----------------------+--------+ CCA Prox  102     16                                              +----------+--------+--------+--------+-----------------------+--------+ CCA Distal100     16                                              +----------+--------+--------+--------+-----------------------+--------+ ICA Prox  72      11              smooth and heterogenous         +----------+--------+--------+--------+-----------------------+--------+ ICA Distal126     26                                              +----------+--------+--------+--------+-----------------------+--------+ ECA       89      4                                               +----------+--------+--------+--------+-----------------------+--------+ +----------+--------+--------+----------------+-------------------+  PSV cm/sEDV cm/sDescribe        Arm Pressure (mmHG) +----------+--------+--------+----------------+-------------------+ Subclavian118             Multiphasic, WNL                     +----------+--------+--------+----------------+-------------------+ +---------+--------+--+--------+-+---------+ VertebralPSV cm/s56EDV cm/s9Antegrade +---------+--------+--+--------+-+---------+  Summary: Right Carotid: Velocities in the right ICA are consistent with a 1-39% stenosis. Left Carotid: Velocities in the left ICA are consistent with a 1-39% stenosis. Vertebrals:  Bilateral vertebral arteries demonstrate antegrade flow. Subclavians: Normal flow hemodynamics were seen in bilateral subclavian              arteries. *See table(s) above for measurements and observations.  Electronically signed by Monica Martinez MD on 10/16/2018 at 4:33:23 PM.    Final      Transthoracic echocardiogram 10/16/2018: IMPRESSIONS   1. Left ventricular ejection fraction, by visual estimation, is 60 to 65%. The left ventricle has normal function. Normal left ventricular size. There is no left ventricular hypertrophy. 2. Global right ventricle has normal systolic function.The right ventricular size is normal. No increase in right ventricular wall thickness. 3. Left atrial size was severely dilated. 4. Right atrial size was normal. 5. Small pericardial effusion. 6. Mild calcification of the posterior mitral valve leaflet(s). 7. Moderate thickening of the posterior mitral valve leaflet(s). 8. Moderately decreased mobility of the mitral valve leaflets. 9. The mitral valve is rheumatic. Mild mitral valve regurgitation. Moderate-severe mitral stenosis. 10. Mean transmitral gradient is 21mHg. Viewed personally TEE from 2019 and value leaflets appear thin and non calcified. Harmonics on current echocardiogram may be causing artifactual thickening. 11. The tricuspid valve is normal in structure. Tricuspid valve regurgitation was not visualized by color flow Doppler. 12. The aortic valve is normal in structure. Aortic valve regurgitation is mild by color flow Doppler. Mild aortic valve sclerosis  without stenosis. 13. The pulmonic valve was normal in structure. Pulmonic valve regurgitation is trivial by color flow Doppler. 14. Mildly elevated pulmonary artery systolic pressure. 15. The inferior vena cava is normal in size with greater than 50% respiratory variability, suggesting right atrial pressure of 3 mmHg. 16. Rheumatic mitral valve stenosis. Transmitral gradient has increased. Consider repeat TEE and stress echo to evaluate transmitral gradient during exercise. May need a candidate for balloon valvuloplasty.  Transesophageal echocardiogram 10/17/2018: IMPRESSIONS   1. Left ventricular ejection fraction, by visual estimation, is 55 to 60%. The left ventricle has normal function. Normal left ventricular size. There is no left ventricular hypertrophy. 2. Global right ventricle has normal systolic function.The right ventricular size is normal. No increase in right ventricular wall thickness. 3. Pulmonary hypertension. 4. Left atrial size was severely dilated. 5. No LAA thrombus. 6. Right atrial size was normal. 7. The mitral valve is rheumatic. Mild mitral valve regurgitation. Moderate-severe mitral stenosis. moderate mitral valve stenosis. 8. Moderate stenosis by pressure half time 1.2cm squared and mean gradient (872mg). PISA radius 1.2cm, with calculated area of 1.2 cm squared. Valve leaflets are thin, pliable, no subvalvular thickening, however surrounding P1 (posterior leaflet) there is a calcified fixed nodule (MAC like appearance). 9. The tricuspid valve is normal in structure. Tricuspid valve regurgitation is mild. 10. The aortic valve is tricuspid Aortic valve regurgitation is mild by color flow Doppler. Mild aortic valve sclerosis without stenosis. 11. The pulmonic valve was normal in structure. Pulmonic valve regurgitation is not visualized by color flow Doppler. 12. Moderately elevated pulmonary artery systolic pressure. 13. The inferior vena cava is normal  in size with greater than 50% respiratory variability, suggesting right atrial pressure of 3 mmHg. 14. Consider stress echo with transmitral gradient measurements. 15. Her moderate to severely elevated pulmonary pressures may indicate along with AFIB and markedly dilated LA along with symptoms, that mitral valve replacement is indicated. 16. However pulmonary pressures are high (60-15mHg). Although leaflets are thin, pliable and without subvalvular thickening, she does have a calcified density on P1 which would likely increase risk with balloon valvuloplasty. 17. Rheumatic moderate mitral stenosis.  Right/Left heart catheterization 10/18/2018:   Subjective: Patient seen and examined at bedside, husband present.  No specific complaints.  Ready for discharge home.  Okay per cardiology with scheduled follow-up at Coumadin clinic on 10/23/2018.  Patient to follow-up with cardiothoracic surgery outpatient for further consideration of MVR and maze procedure.  Denies headache, no chest pain, no palpitations, no dizziness, no shortness of breath, no abdominal pain, no headache, no weakness, no fatigue, no paresthesias.  No acute events overnight per nursing staff.   Discharge Exam: Vitals:   10/20/18 0400 10/20/18 0836  BP: (!) 117/56 136/63  Pulse: 61 72  Resp: 18   Temp: 98.2 F (36.8 C)   SpO2: 97%    Vitals:   10/19/18 2009 10/19/18 2030 10/20/18 0400 10/20/18 0836  BP: (!) 127/53  (!) 117/56 136/63  Pulse: 66 64 61 72  Resp: _0 Temp: 98.1 F (36.7 C)  98.2 F (36.8 C)   TempSrc: Oral  Oral   SpO2: 99% 98% 97%   Weight:   69.6 kg   Height:        General: Pt is alert, awake, not in acute distress Cardiovascular: RRR, SG3/O7+, 3/6 diastolic murmur noted left lower sternal border, no rubs, no gallops, no edema Respiratory: CTA bilaterally, no wheezing, no rhonchi Abdominal: Soft, NT, ND, bowel sounds + Extremities: no edema, no cyanosis    The results of significant  diagnostics from this hospitalization (including imaging, microbiology, ancillary and laboratory) are listed below for reference.     Microbiology: Recent Results (from the past 240 hour(s))  SARS CORONAVIRUS 2 (TAT 6-24 HRS) Nasopharyngeal Nasopharyngeal Swab     Status: None   Collection Time: 10/15/18  6:25 PM   Specimen: Nasopharyngeal Swab  Result Value Ref Range Status   SARS Coronavirus 2 NEGATIVE NEGATIVE Final    Comment: (NOTE) SARS-CoV-2 target nucleic acids are NOT DETECTED. The SARS-CoV-2 RNA is generally detectable in upper and lower respiratory specimens during the acute phase of infection. Negative results do not preclude SARS-CoV-2 infection, do not rule out co-infections with other pathogens, and should not be used as the sole basis for treatment or other patient management decisions. Negative results must be combined with clinical observations, patient history, and epidemiological information. The expected result is Negative. Fact Sheet for Patients: hSugarRoll.beFact Sheet for Healthcare Providers: hhttps://www.woods-mathews.com/This test is not yet approved or cleared by the [place] and  has been authorized for detection and/or diagnosis of SARS-CoV-2 by FDA under an Emergency Use Authorization (EUA). This EUA will remain  in effect (meaning this test can be used) for the duration of the COVID-19 declaration under Section 56 4(b)(1) of the Act, 21 U.S.C. section 360bbb-3(b)(1), unless the authorization is terminated or revoked sooner. Performed at [facility] [address] [place] [id number]     Labs: BNP (last 3 results) No results for input(s): BNP in the last 8760 hours. Basic Metabolic  Panel: Recent Labs  Lab 10/15/18 1259 10/16/18 0233 10/17/18 0628 10/18/18 0442 10/18/18 1044 10/18/18 1049 10/18/18 1050 10/18/18 1709  NA 135 136 138 136 141 141 142 139  K 4.1 3.4* 3.6  3.7 4.0 4.0 3.9 3.5  CL 97* 100 102 101  --   --   --  105  CO2 _0 --   --   --  27  GLUCOSE 124* 112* 91 110*  --   --   --  115*  BUN _1 --   --   --  7*  CREATININE 0.88 0.81 0.73 0.82  --   --   --  0.70  CALCIUM 9.5 8.9 8.5* 8.5*  --   --   --  8.3*   Liver Function Tests: Recent Labs  Lab 10/15/18 1259  AST 21  ALT 19  ALKPHOS 60  BILITOT 1.0  PROT 6.6  ALBUMIN 3.8   No results for input(s): LIPASE, AMYLASE in the last 168 hours. No results for input(s): AMMONIA in the last 168 hours. CBC: Recent Labs  Lab 10/15/18 1259 10/17/18 0628 10/18/18 0442 10/18/18 1044 10/18/18 1049 10/18/18 1050 10/19/18 0359  WBC 6.2 5.5 6.4  --   --   --  7.2  HGB 13.6 13.1 12.7 12.6 12.6 12.2 11.8*  HCT 42.2 38.7 37.1 37.0 37.0 36.0 36.2  MCV 98.6 96.5 96.1  --   --   --  97.8  PLT 206 161 176  --   --   --  189   Cardiac Enzymes: No results for input(s): CKTOTAL, CKMB, CKMBINDEX, TROPONINI in the last 168 hours. BNP: Invalid input(s): POCBNP CBG: Recent Labs  Lab 10/15/18 1257 10/17/18 0654 10/18/18 0503 10/19/18 0612  GLUCAP 122* 97 113* 96   D-Dimer No results for input(s): DDIMER in the last 72 hours. Hgb A1c Recent Labs    10/18/18 0442  HGBA1C 5.2   Lipid Profile No results for input(s): CHOL, HDL, LDLCALC, TRIG, CHOLHDL, LDLDIRECT in the last 72 hours. Thyroid function studies No results for input(s): TSH, T4TOTAL, T3FREE, THYROIDAB in the last 72 hours.  Invalid input(s): FREET3 Anemia work up No results for input(s): VITAMINB12, FOLATE, FERRITIN, TIBC, IRON, RETICCTPCT in the last 72 hours. Urinalysis    Component Value Date/Time   COLORURINE YELLOW 10/15/2018 1554   APPEARANCEUR CLEAR 10/15/2018 1554   LABSPEC 1.010 10/15/2018 1554   PHURINE 7.0 10/15/2018 1554   GLUCOSEU NEGATIVE 10/15/2018 1554   HGBUR SMALL (A) 10/15/2018 1554   BILIRUBINUR NEGATIVE 10/15/2018 1554   KETONESUR NEGATIVE 10/15/2018 1554   PROTEINUR NEGATIVE  10/15/2018 1554   NITRITE NEGATIVE 10/15/2018 1554   LEUKOCYTESUR NEGATIVE 10/15/2018 1554   Sepsis Labs Invalid input(s): PROCALCITONIN,  WBC,  LACTICIDVEN Microbiology Recent Results (from the past 240 hour(s))  SARS CORONAVIRUS 2 (TAT 6-24 HRS) Nasopharyngeal Nasopharyngeal Swab     Status: None   Collection Time: 10/15/18  6:25 PM   Specimen: Nasopharyngeal Swab  Result Value Ref Range Status   SARS Coronavirus 2 NEGATIVE NEGATIVE Final    Comment: (NOTE) SARS-CoV-2 target nucleic acids are NOT DETECTED. The SARS-CoV-2 RNA is generally detectable in upper and lower respiratory specimens during the acute phase of infection. Negative results do not preclude SARS-CoV-2 infection, do not rule out co-infections with other pathogens, and should not be used as the sole basis for treatment or other patient management decisions. Negative results must be combined with clinical observations,  patient history, and epidemiological information. The expected result is Negative. Fact Sheet for Patients: SugarRoll.be Fact Sheet for Healthcare Providers: https://www.woods-mathews.com/ This test is not yet approved or cleared by the [place] FDA and  has been authorized for detection and/or diagnosis of SARS-CoV-2 by FDA under an Emergency Use Authorization (EUA). This EUA will remain  in effect (meaning this test can be used) for the duration of the COVID-19 declaration under Section 56 4(b)(1) of the Act, 21 U.S.C. section 360bbb-3(b)(1), unless the authorization is terminated or revoked sooner. Performed at [facility], [place] [address] [place], [place] [address]      Time coordinating discharge: Over 30 minutes  SIGNED:   Aashka Salomone J [place] DO  [facility] 10/20/2018, 10:31 AM

## 2018-10-20 NOTE — Plan of Care (Signed)
  Problem: Education: Goal: Knowledge of General Education information will improve Description: Including pain rating scale, medication(s)/side effects and non-pharmacologic comfort measures Outcome: Progressing   Problem: Pain Managment: Goal: General experience of comfort will improve Outcome: Progressing   Problem: Skin Integrity: Goal: Risk for impaired skin integrity will decrease Outcome: Progressing   Problem: Coping: Goal: Level of anxiety will decrease Outcome: Progressing   Problem: Nutrition: Goal: Adequate nutrition will be maintained Outcome: Progressing

## 2018-10-20 NOTE — Progress Notes (Signed)
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 Amoxicillin Diarrhea    Has patient had a PCN reaction causing immediate rash, facial/tongue/throat swelling, SOB or lightheadedness with hypotension: no Has patient had a PCN reaction causing severe rash involving mucus membranes or skin necrosis: no Has patient had a PCN reaction that required hospitalization: no Has patient had a PCN reaction occurring within the last 10 years: no If all of the above answers are "NO", then may proceed with Cephalosporin use.   . Metoprolol Other (See Comments)    Mouth tastes like mold  . Metoprolol Tartrate     Other reaction(s): Other (See Comments) Mouth sores  . Oxaprozin Nausea Only    Patient Measurements: Height: 5' (152.4 cm) Weight: 153 lb 6.4 oz (69.6 kg) IBW/kg (Calculated) : 45.5 Heparin Dosing Weight: 60kg  Vital Signs: Temp: 98.2 F (36.8 C) (09/25 0400) Temp Source: Oral (09/25 0400) BP: 136/63 (09/25 0836) Pulse Rate: 72 (09/25 0836)  Labs: Recent Labs    10/17/18 1657 10/18/18 0442  10/18/18 1049 10/18/18 1050 10/18/18 1709 10/19/18 0359 10/19/18 1123 10/20/18 0342  HGB  --  12.7   < > 12.6 12.2  --  11.8*  --   --   HCT  --  37.1   < > 37.0 36.0  --  36.2  --   --   PLT  --  176  --   --   --   --  189  --   --   APTT 109* 87*  --   --   --   --  56*  --   --   LABPROT  --   --   --   --   --   --   --  14.6 16.4*  INR  --   --   --   --   --   --   --  1.2 1.3*  HEPARINUNFRC  --  1.03*  --   --   --   --  0.38  --   --   CREATININE  --  0.82  --   --   --  0.70  --   --   --    < > = values in this interval not displayed.    Estimated Creatinine Clearance: 53.7 mL/min (by C-G formula based on SCr of 0.7 mg/dL).   Medical History: Past Medical History:  Diagnosis Date  . Anxiety 06/29/2012  . Aortic  regurgitation 08/13/2015  . Arthritis    "hands, wrists, back, feet, toes" (06/24/2017)  . Atrial flutter ([place]) 09/18/2015  . Atypical chest pain 05/13/2016  . Chest pain    remote cath in 1996 with NORMAL coronaries noted  . CHF (congestive heart failure) ([place])   . Dyspnea 10/30/2010  . Essential hypertension 09/05/2013  . Exogenous obesity    history of   . GERD (gastroesophageal reflux disease)   . Glaucoma, both eyes   . Headache, migraine    "stopped in my 50's" (09/18/2015)  . History of cardiovascular stress test 2007   showing no ischemia  . Hypertension   . Mitral stenosis and aortic insufficiency 06/23/2010  . Mitral stenosis with insufficiency   . Murmur    of mitral stenosis and moderate aortic insufficiency      . Nausea 05/01/2018  . OSA on CPAP   .  Palpitations    occasional  . Paroxysmal A-fib ([place]) 06/24/2017  . Pericardial effusion 09/18/2015  . Persistent atrial fibrillation 03/10/2017  . Personal history of rheumatic heart disease   . SBE (subacute bacterial endocarditis)    prophylaxis  . Sliding hiatal hernia   . Vertigo 05/01/2018   Assessment: 73 year old female with afib history, on Apixaban PTA. Last apixaban dose 9/21 AM in hospital , then transitioned to heparin infusion due to procedures needed.  S/p Cardiac Cath 9/23 found nomal coronaries but severe mitral valve stenosis, plan for MV replacement.  IV heparin drip resumed 9/23 8 hours post cath.  With Valvular Afib w/ mitral valve disease, MD is changing from apixaban PTA to Warfarin. Pharmacy consulted 10/19/18  to switch from  IV heparin to Lovenox bridge to Warfarin. Lovenox 70mg  sq q12h started 9/24.  RN educated patient on self admiistration of sq lovenox at home, awaiting therapeutic INR>  CBC stable, slight decr hgb 11.8, pltc wnl stable on 9/24. No bleeding or hematoma. noted Warfarin predictor points = 2 points INR today is 1.3, subtherapeutic,  Warfarin started yesterday 10/18/18.  Dr. [place]  asked me for discharge warfarin dose.  See plan below.   Goal of Therapy:  INR 2-3 Heparin level 0.3-0.7 units/ml aPTT 66-102s seconds Monitor platelets by anticoagulation protocol: Yes   Plan:   I recommended Warfarin 5 mg daily. With INR check on Mon 9/28 at [facility].  If INR can not be checked on 9/28 then reduce dose on 9/28 to 2.5 mg daily. Continuing Lovenox bridge until INR therapeutic. [facility] appt 9/28 Monday (new coumadin) I re-educated patient about Warfarin . Coumadin book given to patient and plan to watch coumadin video prior to discharge today.  TOC to fill rx for warfarin 5mg , lovenox 70mg  q12hr.   Nicole Cella, RPh Clinical Pharmacist [phone] Please check AMION for all [place] numbers 10/20/2018 12:25 PM

## 2018-10-20 NOTE — Telephone Encounter (Signed)
Patient is currently admitted

## 2018-10-20 NOTE — Progress Notes (Signed)
Patient's educated about Lovenox injection, I was able to watch her administer to herself. The first time she pulled the plunger of the Lovenox and wasted the medication, patient is educated about not pulling the plunger but hold on the the seringe and pull up to get the cap off.

## 2018-10-20 NOTE — Telephone Encounter (Signed)
Still admitted  10/15/2018 - present (5 days) [place]

## 2018-10-20 NOTE — Progress Notes (Signed)
Awaiting on pharmacy and MD to reconcile medications.

## 2018-10-23 ENCOUNTER — Other Ambulatory Visit: Payer: Self-pay

## 2018-10-23 ENCOUNTER — Ambulatory Visit (INDEPENDENT_AMBULATORY_CARE_PROVIDER_SITE_OTHER): Payer: Medicare Other | Admitting: Pharmacist Clinician (PhC)/ Clinical Pharmacy Specialist

## 2018-10-23 DIAGNOSIS — I480 Paroxysmal atrial fibrillation: Secondary | ICD-10-CM

## 2018-10-23 DIAGNOSIS — Z7901 Long term (current) use of anticoagulants: Secondary | ICD-10-CM | POA: Insufficient documentation

## 2018-10-23 LAB — POCT INR: INR: 2.7 (ref 2.0–3.0)

## 2018-10-23 NOTE — Telephone Encounter (Signed)
Patient contacted regarding discharge from [facility] on 10/20/2018.  Patient understands to follow up with provider Roby Lofts, PA on 11/02/2018 at 8:45 am, at [facility] office. Patient understands discharge instructions? Yes Patient understands medications and regiment? Yes Patient understands to bring all medications to this visit? Yes

## 2018-10-27 ENCOUNTER — Ambulatory Visit (INDEPENDENT_AMBULATORY_CARE_PROVIDER_SITE_OTHER): Payer: Medicare Other | Admitting: Pharmacist

## 2018-10-27 ENCOUNTER — Telehealth: Payer: Self-pay | Admitting: Cardiovascular Disease

## 2018-10-27 ENCOUNTER — Other Ambulatory Visit: Payer: Self-pay

## 2018-10-27 DIAGNOSIS — Z7901 Long term (current) use of anticoagulants: Secondary | ICD-10-CM

## 2018-10-27 DIAGNOSIS — I480 Paroxysmal atrial fibrillation: Secondary | ICD-10-CM

## 2018-10-27 LAB — POCT INR: INR: 2.8 (ref 2.0–3.0)

## 2018-10-27 NOTE — Telephone Encounter (Signed)
Patient had cath done last Thursday, she went out yesterday. She went to [employer] and walked around, she wore jeans.  She got up last night to use the restroom when she sat down she had pain at the incision site.  She wants to make sure this is normal.  She does have an appt today at 11am to check her PT.

## 2018-10-27 NOTE — Telephone Encounter (Signed)
Spoke to patient . She states the area is below  Puncture site - right  Groin.  No reddeness noted , swelling or lump , no  Drainage , or temp.  RN  informed patient to continue to monitor, may place warm compress on area  10- 15 min  Every 1-2 hours. If any above symptoms occur contact office --  Keep appointment for next week  Patient verbalized  Understanding.

## 2018-11-01 NOTE — Progress Notes (Signed)
Cardiology Office Note   Date:  11/02/2018   ID:  Mary Farrell, DOB November 21, 1945, MRN [id number]  PCP:  Lawerance Cruel, MD  Cardiologist:  Skeet Latch, MD EP: Will Meredith Leeds, MD  Chief Complaint  Patient presents with   Hospitalization Follow-up    Mitral stenosis      History of Present Illness: Mary Farrell is a 73 y.o. female with PMH of rheumatic heart disease now with severe MS, mild aortic insufficiency, chronic pericardial effusion, paroxysmal atrial flutter s/p ablation 05/2017, HTN, OSA on CPAP, who presents for post hospital follow-up.  She was admitted to [facility], [place] from 10/15/2018-10/20/2018 after presenting with a syncopal episode. She underwent an echocardiogram, TEE, and ultimately a R/LHC, all suggestive of severe mitral stenosis with normal coronary arteries. She was evaluated by [facility] and recommended for minimally invasive mitral valve replacement vs mechanical valve replacement with concomitant MAZE procedure for management of her atrial flutter. She was discharged home with plans to follow up outpatient with Dr. Ricard Dillon for ongoing discussions. It was felt her mitral valve disease was 2/2 history of rheumatic fever and she was transition from eliquis to coumadin for stroke ppx, with subsequent outpatient follow-up at the coumadin clinic for management (INR 2.8 10/27/2018).   She presents today for post-hospital follow-up of her mitral stenosis. She has been doing well since discharge from the hospital. She has some bruising on her arms but no complaints of bleeding with coumadin.  INR 4.6 today and she was provided instructions for coumadin dosing going forward. She reports a couple episodes of feeling nauseated and anxious with BP elevated to 140s/60s. Generally BP well controlled in the 110s-120s/50s-60s. She denied complaints of chest pain, SOB, DOE, palpitations, dizziness, lightheadedness, or recurrent syncope. She continues to have reservations about when to  proceed with valve replacement and whether minimally invasive or mechanical valve is the right choice. Dr. Curt Bears feels that lifelong anticoagulation with coumadin would be appropriate going forward which may factor into her decision making. She has concerns that she may outlive the tissue valve and is not interested in having to go through this again. She was encouraged to to follow-up with Dr. Roxy Manns ([facility]) for ongoing discussions.     Past Medical History:  Diagnosis Date   Anxiety 06/29/2012   Aortic regurgitation 08/13/2015   Arthritis    "hands, wrists, back, feet, toes" (06/24/2017)   Atrial flutter ([place]) 09/18/2015   Atypical chest pain 05/13/2016   Chest pain    remote cath in 1996 with NORMAL coronaries noted   CHF (congestive heart failure) ([place])    Dyspnea 10/30/2010   Essential hypertension 09/05/2013   Exogenous obesity    history of    GERD (gastroesophageal reflux disease)    Glaucoma, both eyes    Headache, migraine    "stopped in my 57's" (09/18/2015)   History of cardiovascular stress test 2007   showing no ischemia   Hypertension    Mitral stenosis and aortic insufficiency 06/23/2010   Mitral stenosis with insufficiency    Murmur    of mitral stenosis and moderate aortic insufficiency       Nausea 05/01/2018   OSA on CPAP    Palpitations    occasional   Paroxysmal A-fib ([place]) 06/24/2017   Pericardial effusion 09/18/2015   Persistent atrial fibrillation ([place]) 03/10/2017   Personal history of rheumatic heart disease    SBE (subacute bacterial endocarditis)    prophylaxis   Sliding hiatal hernia  Vertigo 05/01/2018    Past Surgical History:  Procedure Laterality Date   ABDOMINAL HYSTERECTOMY  1975   APPENDECTOMY  1977   ATRIAL FIBRILLATION ABLATION N/A 03/10/2017   Procedure: ATRIAL FIBRILLATION ABLATION;  Surgeon: [name], MD;  Location: [facility];  Service: Cardiovascular;  Laterality: N/A;   ATRIAL FIBRILLATION  ABLATION  06/24/2017   ATRIAL FIBRILLATION ABLATION N/A 06/24/2017   Procedure: ATRIAL FIBRILLATION ABLATION;  Surgeon: [name], MD;  Location: [facility];  Service: Cardiovascular;  Laterality: N/A;   BUNIONECTOMY Right 2000s   CARDIAC CATHETERIZATION  01/13/1995   normal coronary anatomy and mild mitral stenosis and mild pulmonary hypertension   CARDIOVERSION N/A 09/22/2015   Procedure: CARDIOVERSION;  Surgeon: Jerline Pain, MD;  Location: [place];  Service: Cardiovascular;  Laterality: N/A;   CARDIOVERSION N/A 10/25/2016   Procedure: CARDIOVERSION;  Surgeon: Fay Records, MD;  Location: [place];  Service: Cardiovascular;  Laterality: N/A;   CARDIOVERSION N/A 04/15/2017   Procedure: CARDIOVERSION;  Surgeon: Josue Hector, MD;  Location: [facility];  Service: Cardiovascular;  Laterality: N/A;   CARDIOVERSION N/A 05/16/2017   Procedure: CARDIOVERSION;  Surgeon: Pixie Casino, MD;  Location: [facility] ENDOSCOPY;  Service: Cardiovascular;  Laterality: N/A;   CATARACT EXTRACTION W/ INTRAOCULAR LENS  IMPLANT, BILATERAL Bilateral 2013   LAPAROSCOPIC CHOLECYSTECTOMY  1997   RIGHT/LEFT HEART CATH AND CORONARY ANGIOGRAPHY N/A 10/18/2018   Procedure: RIGHT/LEFT HEART CATH AND CORONARY ANGIOGRAPHY;  Surgeon: Leonie Man, MD;  Location: [facility];  Service: Cardiovascular;  Laterality: N/A;   TEE WITHOUT CARDIOVERSION N/A 06/23/2017   Procedure: TRANSESOPHAGEAL ECHOCARDIOGRAM (TEE);  Surgeon: Fay Records, MD;  Location: [place];  Service: Cardiovascular;  Laterality: N/A;   TEE WITHOUT CARDIOVERSION N/A 10/17/2018   Procedure: TRANSESOPHAGEAL ECHOCARDIOGRAM (TEE);  Surgeon: Jerline Pain, MD;  Location: [facility] ENDOSCOPY;  Service: Cardiovascular;  Laterality: N/A;   TONSILLECTOMY AND ADENOIDECTOMY  1990s   UVULOPALATOPHARYNGOPLASTY, TONSILLECTOMY AND SEPTOPLASTY  1990s     Current Outpatient Medications  Medication Sig Dispense Refill    acetaminophen (TYLENOL) 500 MG tablet Take 500 mg by mouth every 6 (six) hours as needed for moderate pain or headache.     ALPRAZolam (XANAX) 0.25 MG tablet Take 0.125 mg by mouth 2 (two) times daily as needed for anxiety.      carvedilol (COREG) 3.125 MG tablet Take 1 tablet (3.125 mg total) by mouth 2 (two) times daily with a meal. 180 tablet 3   cetirizine (ZYRTEC) 10 MG tablet Take 10 mg by mouth at bedtime.      Cholecalciferol (VITAMIN D) 2000 UNITS CAPS Take 2,000 Units by mouth daily.       conjugated estrogens (PREMARIN) vaginal cream Place 1 Applicatorful vaginally daily as needed (irritation).      dorzolamide-timolol (COSOPT) 22.3-6.8 MG/ML ophthalmic solution Place 1 drop into both eyes daily.     doxepin (SINEQUAN) 10 MG capsule Take 10-20 mg by mouth at bedtime as needed (sleep).      famotidine (PEPCID) 20 MG tablet Take 20 mg by mouth at bedtime.     flecainide (TAMBOCOR) 50 MG tablet TAKE 2 TABLETS BY MOUTH TWO TIMES DAILY (Patient taking differently: Take 100 mg by mouth 2 (two) times daily. ) 360 tablet 3   gabapentin (NEURONTIN) 300 MG capsule Take 300 mg by mouth 3 (three) times daily.      hydrochlorothiazide (MICROZIDE) 12.5 MG capsule TAKE 1 CAPSULE BY MOUTH  DAILY (Patient taking differently: Take 12.5 mg by mouth daily. ) 90 capsule 3   lactobacillus acidophilus (BACID) TABS tablet Take 1 tablet by mouth daily.      latanoprost (XALATAN) 0.005 % ophthalmic solution Place 1 drop into both eyes at bedtime.   6   losartan (COZAAR) 50 MG tablet Take 50 mg by mouth daily.     Multiple Vitamin (MULTIVITAMIN WITH MINERALS) TABS tablet Take 1 tablet by mouth daily.     NON FORMULARY Apply 1 application topically 2 (two) times daily as needed (for eczema). Traimcinolone/[employer] Moist Cream     omeprazole (PRILOSEC) 40 MG capsule Take 40 mg by mouth daily.      oxybutynin (DITROPAN-XL) 10 MG 24 hr tablet Take 10 mg by mouth daily.      polyethylene glycol (MIRALAX  / GLYCOLAX) 17 g packet Take 17 g by mouth See admin instructions. 3-4 times a week     PRESCRIPTION MEDICATION Inhale into the lungs at bedtime. CPAP     warfarin (COUMADIN) 5 MG tablet Take 1 tablet (5 mg total) by mouth daily. Take daily as directed my anticoagulation clinic 30 tablet 0   enoxaparin (LOVENOX) 80 MG/0.8ML injection Inject 0.7 mLs (70 mg total) into the skin every 12 (twelve) hours for 4 days. (Patient not taking: Reported on 11/02/2018) 5.6 mL 0   warfarin (COUMADIN) 5 MG tablet Take 1 tablet (5 mg total) by mouth daily at 6 PM for 3 days. 3 tablet 0   No current facility-administered medications for this visit.     Allergies:   Amoxicillin, Metoprolol, Metoprolol tartrate, and Oxaprozin    Social History:  The patient  reports that she has never smoked. She has never used smokeless tobacco. She reports that she does not drink alcohol or use drugs.   Family History:  The patient's family history includes Diabetes in her brother; Heart attack in her mother; Heart failure in her maternal grandfather; Hypertension in her brother; Kidney disease in her brother.    ROS:  Please see the history of present illness.   Otherwise, review of systems are positive for none.   All other systems are reviewed and negative.    PHYSICAL EXAM: VS:  BP 118/66    Pulse 65    Temp (!) 97.3 F (36.3 C)    Ht 5' (1.524 m)    Wt 152 lb 3.2 oz (69 kg)    SpO2 95%    BMI 29.72 kg/m  , BMI Body mass index is 29.72 kg/m. GEN: Well nourished, well developed, in no acute distress HEENT: sclera anicteric Neck: no JVD, carotid bruits, or masses Cardiac: RRR; no murmurs, rubs, or gallops, no edema  Respiratory:  clear to auscultation bilaterally, normal work of breathing GI: soft, nontender, nondistended, + BS MS: no deformity or atrophy Skin: warm and dry, no rash, bruising on bilateral UE mostly at IV and venipuncture sites from hospitalization.  Neuro:  Strength and sensation are  intact Psych: euthymic mood, full affect   EKG:  EKG is ordered today. The ekg ordered today demonstrates sinus rhythm with 1st degree AV block, rate 65 bpm, QTc 443, incomplete RBBB, non-specific T wave abnormalities, no STE/D, no significant change from previous.    Recent Labs: 10/15/2018: ALT 19 10/18/2018: BUN 7; Creatinine, Ser 0.70; Potassium 3.5; Sodium 139 10/19/2018: Hemoglobin 11.8; Platelets 189    Lipid Panel No results found for: CHOL, TRIG, HDL, CHOLHDL, VLDL, LDLCALC, LDLDIRECT    Wt Readings  from Last 3 Encounters:  11/02/18 152 lb 3.2 oz (69 kg)  10/20/18 153 lb 6.4 oz (69.6 kg)  07/20/18 157 lb (71.2 kg)      Other studies Reviewed: Additional studies/ records that were reviewed today include:   Right/Left heart catheterization 10/18/2018:  Hemodynamic findings consistent with mild pulmonary hypertension.  LV end diastolic pressure is normal.  There is severe mitral valve stenosis -suggested by a echocardiogram, large PCWP V waves along with elevated PCWP but normal LVEDP would also suggest this.  Angiographically normal coronary arteries  SUMMARY  Angiographically normal coronary arteries  Mild pulmonary hypertension with mildly elevated PCWP but normal LVEDP suggestive of mitral stenosis  Significant V wave noted on PCWP waveform suggesting mitral stenosis.  RECOMMENDATIONS  Return to nursing unit for ongoing care.   Proceed with plans for mitral valve replacement   TEE 10/17/2018 1. Left ventricular ejection fraction, by visual estimation, is 55 to 60%. The left ventricle has normal function. Normal left ventricular size. There is no left ventricular hypertrophy. 2. Global right ventricle has normal systolic function.The right ventricular size is normal. No increase in right ventricular wall thickness. 3. Pulmonary hypertension. 4. Left atrial size was severely dilated. 5. No LAA thrombus. 6. Right atrial size was normal. 7. The  mitral valve is rheumatic. Mild mitral valve regurgitation. Moderate-severe mitral stenosis. moderate mitral valve stenosis. 8. Moderate stenosis by pressure half time 1.2cm squared and mean gradient (62mHg). PISA radius 1.2cm, with calculated area of 1.2 cm squared. Valve leaflets are thin, pliable, no subvalvular thickening, however surrounding P1 (posterior leaflet) there is a calcified fixed nodule (MAC like appearance). 9. The tricuspid valve is normal in structure. Tricuspid valve regurgitation is mild. 10. The aortic valve is tricuspid Aortic valve regurgitation is mild by color flow Doppler. Mild aortic valve sclerosis without stenosis. 11. The pulmonic valve was normal in structure. Pulmonic valve regurgitation is not visualized by color flow Doppler. 12. Moderately elevated pulmonary artery systolic pressure. 13. The inferior vena cava is normal in size with greater than 50% respiratory variability, suggesting right atrial pressure of 3 mmHg. 14. Consider stress echo with transmitral gradient measurements. 15. Her moderate to severely elevated pulmonary pressures may indicate along with AFIB and markedly dilated LA along with symptoms, that mitral valve replacement is indicated. 16. However pulmonary pressures are high (60-674mg). Although leaflets are thin, pliable and without subvalvular thickening, she does have a calcified density on P1 which would likely increase risk with balloon valvuloplasty. 17. Rheumatic moderate mitral stenosis.  Echocardiogram 10/16/2018: 1. Left ventricular ejection fraction, by visual estimation, is 60 to 65%. The left ventricle has normal function. Normal left ventricular size. There is no left ventricular hypertrophy. 2. Global right ventricle has normal systolic function.The right ventricular size is normal. No increase in right ventricular wall thickness. 3. Left atrial size was severely dilated. 4. Right atrial size was normal. 5. Small  pericardial effusion. 6. Mild calcification of the posterior mitral valve leaflet(s). 7. Moderate thickening of the posterior mitral valve leaflet(s). 8. Moderately decreased mobility of the mitral valve leaflets. 9. The mitral valve is rheumatic. Mild mitral valve regurgitation. Moderate-severe mitral stenosis. 10. Mean transmitral gradient is 58m59m. Viewed personally TEE from 2019 and value leaflets appear thin and non calcified. Harmonics on current echocardiogram may be causing artifactual thickening. 11. The tricuspid valve is normal in structure. Tricuspid valve regurgitation was not visualized by color flow Doppler. 12. The aortic valve is normal in structure. Aortic valve regurgitation is  mild by color flow Doppler. Mild aortic valve sclerosis without stenosis. 13. The pulmonic valve was normal in structure. Pulmonic valve regurgitation is trivial by color flow Doppler. 14. Mildly elevated pulmonary artery systolic pressure. 15. The inferior vena cava is normal in size with greater than 50% respiratory variability, suggesting right atrial pressure of 3 mmHg. 16. Rheumatic mitral valve stenosis. Transmitral gradient has increased. Consider repeat TEE and stress echo to evaluate transmitral gradient during exercise. May need a candidate for balloon valvuloplasty.    ASSESSMENT AND PLAN:  1. Severe mitral stenosis: 2/2 rheumatic heart disease. Evaluated by [facility] during recent admission, however patient wanted to discuss options further outpatient (minimially invasive replacement vs mechanical valve +/- MAZE). Volume status appears stable and she has not had recurrent syncope.  - Encourage patient to schedule an appointment with Dr. Roxy Manns for ongoing discussions of valve replacement.   2. Paroxysmal atrial fibrillation/flutter s/p ablation 05/2017: she has had a couple recurrent episodes since her ablation. Maintained sinus rhythm during admission. Would be considered for MAZE at the  time of valve replacement. Transitioned from eliquis to coumadin for valvular atrial fibrillation. Following with the coumadin clinic for ongoing management - INR 4.6 today. No complaints of bleeding.  - Continue coumadin per coumadin clinic for stroke ppx - Continue carvedilol for rate control - Continue flecainide for rhythm control  3. HTN: BP well controlled at 118/66 today - Continue losartan, carvedilol, and HCTZ  4. OSA: on CPAP  5. Aortic insufficiency: mild on echo 09/2018 - Continue routine outpatient monitoring   Current medicines are reviewed at length with the patient today.  The patient does not have concerns regarding medicines.  The following changes have been made:  no change  Labs/ tests ordered today include:   Orders Placed This Encounter  Procedures   EKG 12-Lead     Disposition:   FU with Dr. Curt Bears as scheduled in December and Dr. Oval Linsey in 3 months or sooner if decision made to pursue valve replacement before the end of the year. Patient to call Dr. Guy Sandifer office to arrange an appointment within the next 1-2 weeks.   Signed, Abigail Butts, PA-C  11/02/2018 10:00 AM

## 2018-11-02 ENCOUNTER — Encounter: Payer: Self-pay | Admitting: Medical

## 2018-11-02 ENCOUNTER — Other Ambulatory Visit: Payer: Self-pay

## 2018-11-02 ENCOUNTER — Ambulatory Visit (INDEPENDENT_AMBULATORY_CARE_PROVIDER_SITE_OTHER): Payer: Medicare Other | Admitting: Medical

## 2018-11-02 ENCOUNTER — Ambulatory Visit (INDEPENDENT_AMBULATORY_CARE_PROVIDER_SITE_OTHER): Payer: Medicare Other | Admitting: Pharmacist Clinician (PhC)/ Clinical Pharmacy Specialist

## 2018-11-02 VITALS — BP 118/66 | HR 65 | Temp 97.3°F | Ht 60.0 in | Wt 152.2 lb

## 2018-11-02 DIAGNOSIS — I050 Rheumatic mitral stenosis: Secondary | ICD-10-CM | POA: Diagnosis not present

## 2018-11-02 DIAGNOSIS — G4733 Obstructive sleep apnea (adult) (pediatric): Secondary | ICD-10-CM | POA: Diagnosis not present

## 2018-11-02 DIAGNOSIS — I480 Paroxysmal atrial fibrillation: Secondary | ICD-10-CM

## 2018-11-02 DIAGNOSIS — Z7901 Long term (current) use of anticoagulants: Secondary | ICD-10-CM

## 2018-11-02 DIAGNOSIS — I10 Essential (primary) hypertension: Secondary | ICD-10-CM | POA: Diagnosis not present

## 2018-11-02 DIAGNOSIS — Z9989 Dependence on other enabling machines and devices: Secondary | ICD-10-CM

## 2018-11-02 LAB — POCT INR: INR: 4.6 — AB (ref 2.0–3.0)

## 2018-11-02 MED ORDER — CARVEDILOL 3.125 MG PO TABS: 3.1250 mg | ORAL_TABLET | Freq: Two times a day (BID) | ORAL | 3 refills | Status: DC

## 2018-11-02 NOTE — Addendum Note (Signed)
Addended by: Jacqulynn Cadet on: 11/02/2018 10:06 AM   Modules accepted: Orders

## 2018-11-02 NOTE — Patient Instructions (Signed)
No warfarin x 2 days, then take 1/2 tablet daily except 1 tablet each Monday and Friday.  Repeat INR in 1 week

## 2018-11-02 NOTE — Patient Instructions (Addendum)
Medication Instructions:  Your physician recommends that you continue on your current medications as directed. Please refer to the Current Medication list given to you today.  If you need a refill on your cardiac medications before your next appointment, please call your pharmacy.   Lab work: NONE ordered at this time of appointment   If you have labs (blood work) drawn today and your tests are completely normal, you will receive your results only by: Marland Kitchen MyChart Message (if you have MyChart) OR . A paper copy in the mail If you have any lab test that is abnormal or we need to change your treatment, we will call you to review the results.  Testing/Procedures: NONE ordered at this time of appointment   Follow-Up: At [facility], you and your health needs are our priority.  As part of our continuing mission to provide you with exceptional heart care, we have created designated Provider Care Teams.  These Care Teams include your primary Cardiologist (physician) and Advanced Practice Providers (APPs -  Physician Assistants and Nurse Practitioners) who all work together to provide you with the care you need, when you need it. . You will need a follow up appointment in 3 months with Skeet Latch, MD  Any Other Special Instructions Will Be Listed Below (If Applicable).   Dr. Darylene Price [phone]

## 2018-11-03 ENCOUNTER — Other Ambulatory Visit: Payer: Self-pay | Admitting: Medical

## 2018-11-03 DIAGNOSIS — R55 Syncope and collapse: Secondary | ICD-10-CM

## 2018-11-03 NOTE — Progress Notes (Signed)
   Message sent to Dr. Guy Sandifer office following her outpatient cardiology appointment to assist without outpatient follow-up with [facility] for mitral valve replacement discussion. He recommended a zio patch monitor to further evaluate her syncopal event. Zio monitor was ordered and message sent to Mack Guise to coordinate. Patient updated to plan and in agreement to proceed.   Abigail Butts, PA-C  11/03/18; 12:56 PM

## 2018-11-08 ENCOUNTER — Ambulatory Visit (INDEPENDENT_AMBULATORY_CARE_PROVIDER_SITE_OTHER): Payer: Medicare Other | Admitting: Pharmacist

## 2018-11-08 ENCOUNTER — Other Ambulatory Visit: Payer: Self-pay

## 2018-11-08 DIAGNOSIS — I480 Paroxysmal atrial fibrillation: Secondary | ICD-10-CM

## 2018-11-08 DIAGNOSIS — Z7901 Long term (current) use of anticoagulants: Secondary | ICD-10-CM | POA: Diagnosis not present

## 2018-11-08 LAB — POCT INR: INR: 2.6 (ref 2.0–3.0)

## 2018-11-09 ENCOUNTER — Telehealth: Payer: Self-pay | Admitting: *Deleted

## 2018-11-09 NOTE — Telephone Encounter (Signed)
14 day ZIO AT long term monitor-Live Telemetry to be mailed to the patients home.  Instructions reviewed briefly as they are included in the monitor kit. 

## 2018-11-13 ENCOUNTER — Encounter: Payer: Medicare Other | Admitting: Thoracic Surgery (Cardiothoracic Vascular Surgery)

## 2018-11-16 ENCOUNTER — Ambulatory Visit (INDEPENDENT_AMBULATORY_CARE_PROVIDER_SITE_OTHER): Payer: Medicare Other

## 2018-11-16 ENCOUNTER — Other Ambulatory Visit: Payer: Self-pay | Admitting: *Deleted

## 2018-11-16 ENCOUNTER — Telehealth: Payer: Self-pay | Admitting: *Deleted

## 2018-11-16 ENCOUNTER — Encounter: Payer: Self-pay | Admitting: *Deleted

## 2018-11-16 ENCOUNTER — Institutional Professional Consult (permissible substitution) (INDEPENDENT_AMBULATORY_CARE_PROVIDER_SITE_OTHER): Payer: Medicare Other | Admitting: Thoracic Surgery (Cardiothoracic Vascular Surgery)

## 2018-11-16 ENCOUNTER — Telehealth: Payer: Self-pay | Admitting: Cardiovascular Disease

## 2018-11-16 ENCOUNTER — Other Ambulatory Visit: Payer: Self-pay

## 2018-11-16 VITALS — BP 140/67 | HR 74 | Temp 97.7°F | Resp 20 | Ht 60.0 in | Wt 151.0 lb

## 2018-11-16 DIAGNOSIS — I080 Rheumatic disorders of both mitral and aortic valves: Secondary | ICD-10-CM | POA: Diagnosis not present

## 2018-11-16 DIAGNOSIS — I4891 Unspecified atrial fibrillation: Secondary | ICD-10-CM

## 2018-11-16 DIAGNOSIS — I4811 Longstanding persistent atrial fibrillation: Secondary | ICD-10-CM | POA: Diagnosis not present

## 2018-11-16 DIAGNOSIS — I480 Paroxysmal atrial fibrillation: Secondary | ICD-10-CM

## 2018-11-16 DIAGNOSIS — I4819 Other persistent atrial fibrillation: Secondary | ICD-10-CM | POA: Diagnosis not present

## 2018-11-16 DIAGNOSIS — R55 Syncope and collapse: Secondary | ICD-10-CM

## 2018-11-16 DIAGNOSIS — I050 Rheumatic mitral stenosis: Secondary | ICD-10-CM

## 2018-11-16 NOTE — Progress Notes (Signed)
[address]       [place] [address]             [phone]     CARDIOTHORACIC SURGERY CONSULTATION REPORT  Primary Cardiologist is Skeet Latch, MD PCP is Lawerance Cruel, MD  Chief Complaint  Patient presents with   Mitral Stenosis    Surgical eval, Consulted in hospital on 10/18/18   Atrial Fibrillation    HPI:  Patient is a 73 year old female with likely rheumatic heart disease including mitral stenosis, aortic insufficiency, and recurrent paroxysmal atrial fibrillation and atrial flutter who describes a long history of symptoms consistent with chronic diastolic congestive heart failure and was hospitalized following a brief syncopal event who returns to the office today for follow up consultation visit regarding the management of severe symptomatic mitral stenosis and recurrent paroxysmal atrial fibrillation.  I recently had the opportunity to evaluation the patient in consultation on October 18, 2018 while she was hospitalized following a brief syncopal event.  No definite cause of syncope was identified although it was felt highly likely related to the patient's medications, specifically her beta-blocker.  According to the patient she had developed an episode of paroxysmal atrial fibrillation the day before and was instructed to take an extra dose of carvedilol.  She took another extra dose of carvedilol the following morning when she felt like she was still in atrial fibrillation, and it was subsequent to that event that she passed out.  During her hospitalization her dose of carvedilol was reduced but additional diagnostic testing prompted surgical consultation for management of severe mitral stenosis.  At the time of my initial consultation the patient was not interested in considering surgical intervention during that hospitalization and wanted to go home.  She was converted from Eliquis to warfarin for long-term anticoagulation and has been seen  in follow-up on 1 occasion at [facility].  She called our office to request a follow-up consultation visit and returns to our office today with her husband.  She states that since hospital discharge she has remained stable.  She has not had any further syncopal episodes although she does report occasional slight dizziness.  She describes stable symptoms of exertional shortness of breath.  She states that she gets short of breath with low-level activity and occasionally gets short of breath at night.  Her weight has been stable and she has not had increased lower extremity edema.  She has never had any chest pain or chest tightness.  She thinks that she has been maintaining sinus rhythm since hospital discharge.  She is now wearing a Zio patch.  Past Medical History:  Diagnosis Date   Anxiety 06/29/2012   Aortic regurgitation 08/13/2015   Arthritis    "hands, wrists, back, feet, toes" (06/24/2017)   Atrial flutter ([place]) 09/18/2015   Atypical chest pain 05/13/2016   Chest pain    remote cath in 1996 with NORMAL coronaries noted   CHF (congestive heart failure) ([place])    Dyspnea 10/30/2010   Essential hypertension 09/05/2013   Exogenous obesity    history of    GERD (gastroesophageal reflux disease)    Glaucoma, both eyes    Headache, migraine    "stopped in my 38's" (09/18/2015)   History of cardiovascular stress test 2007   showing no ischemia   Hypertension    Mitral stenosis and aortic insufficiency 06/23/2010   Mitral stenosis with insufficiency    Murmur    of mitral  stenosis and moderate aortic insufficiency       Nausea 05/01/2018   OSA on CPAP    Palpitations    occasional   Paroxysmal A-fib ([facility]) 06/24/2017   Pericardial effusion 09/18/2015   Persistent atrial fibrillation ([place]) 03/10/2017   Personal history of rheumatic heart disease    SBE (subacute bacterial endocarditis)    prophylaxis   Sliding hiatal hernia    Vertigo 05/01/2018    Past Surgical  History:  Procedure Laterality Date   [place] N/A 03/10/2017   Procedure: ATRIAL FIBRILLATION ABLATION;  Surgeon: [name], MD;  Location: [facility];  Service: Cardiovascular;  Laterality: N/A;   ATRIAL FIBRILLATION ABLATION  06/24/2017   ATRIAL FIBRILLATION ABLATION N/A 06/24/2017   Procedure: ATRIAL FIBRILLATION ABLATION;  Surgeon: [name], MD;  Location: [place] [facility];  Service: Cardiovascular;  Laterality: N/A;   BUNIONECTOMY Right 2000s   CARDIAC CATHETERIZATION  01/13/1995   normal coronary anatomy and mild mitral stenosis and mild pulmonary hypertension   CARDIOVERSION N/A 09/22/2015   Procedure: CARDIOVERSION;  Surgeon: Jerline Pain, MD;  Location: [facility];  Service: Cardiovascular;  Laterality: N/A;   CARDIOVERSION N/A 10/25/2016   Procedure: CARDIOVERSION;  Surgeon: Fay Records, MD;  Location: [place];  Service: Cardiovascular;  Laterality: N/A;   CARDIOVERSION N/A 04/15/2017   Procedure: CARDIOVERSION;  Surgeon: Josue Hector, MD;  Location: [facility];  Service: Cardiovascular;  Laterality: N/A;   CARDIOVERSION N/A 05/16/2017   Procedure: CARDIOVERSION;  Surgeon: Pixie Casino, MD;  Location: [facility] ENDOSCOPY;  Service: Cardiovascular;  Laterality: N/A;   CATARACT EXTRACTION W/ INTRAOCULAR LENS  IMPLANT, BILATERAL Bilateral 2013   LAPAROSCOPIC CHOLECYSTECTOMY  1997   RIGHT/LEFT HEART CATH AND CORONARY ANGIOGRAPHY N/A 10/18/2018   Procedure: RIGHT/LEFT HEART CATH AND CORONARY ANGIOGRAPHY;  Surgeon: Leonie Man, MD;  Location: [facility];  Service: Cardiovascular;  Laterality: N/A;   TEE WITHOUT CARDIOVERSION N/A 06/23/2017   Procedure: TRANSESOPHAGEAL ECHOCARDIOGRAM (TEE);  Surgeon: Fay Records, MD;  Location: [facility];  Service: Cardiovascular;  Laterality: N/A;   TEE WITHOUT CARDIOVERSION N/A 10/17/2018   Procedure:  TRANSESOPHAGEAL ECHOCARDIOGRAM (TEE);  Surgeon: Jerline Pain, MD;  Location: [facility] END[facility]COPY;  Service: Cardiovascular;  Laterality: N/A;   TONSILLECTOMY AND ADENOIDECTOMY  1990s   UVULOPALATOPHARYNGOPLASTY, TONSILLECTOMY AND SEPTOPLASTY  1990s    Family History  Problem Relation Age of Onset   Heart attack Mother    Hypertension Brother    Kidney disease Brother    Diabetes Brother    Heart failure Maternal Grandfather     Social History   Socioeconomic History   Marital status: Married    Spouse name: Not on file   Number of children: Not on file   Years of education: Not on file   Highest education level: Not on file  Occupational History   Occupation: Retired  Scientist, product/process development strain: Not on file   Food insecurity    Worry: Not on file    Inability: Not on Lexicographer needs    Medical: Not on file    Non-medical: Not on file  Tobacco Use   Smoking status: Never Smoker   Smokeless tobacco: Never Used  Substance and Sexual Activity   Alcohol use: No   Drug use: No   Sexual activity: Not Currently  Lifestyle   Physical activity  Days per week: Not on file    Minutes per session: Not on file   Stress: Not on file  Relationships   Social connections    Talks on phone: Not on file    Gets together: Not on file    Attends religious service: Not on file    Active member of club or organization: Not on file    Attends meetings of clubs or organizations: Not on file    Relationship status: Not on file   Intimate partner violence    Fear of current or ex partner: Not on file    Emotionally abused: Not on file    Physically abused: Not on file    Forced sexual activity: Not on file  Other Topics Concern   Not on file  Social History Narrative   Lives with husband in [place].    Current Outpatient Medications  Medication Sig Dispense Refill   acetaminophen (TYLENOL) 500 MG tablet Take 500 mg by mouth  every 6 (six) hours as needed for moderate pain or headache.     ALPRAZolam (XANAX) 0.25 MG tablet Take 0.125 mg by mouth 2 (two) times daily as needed for anxiety.      carvedilol (COREG) 3.125 MG tablet Take 1 tablet (3.125 mg total) by mouth 2 (two) times daily with a meal. 180 tablet 3   cetirizine (ZYRTEC) 10 MG tablet Take 10 mg by mouth at bedtime.      Cholecalciferol (VITAMIN D) 2000 UNITS CAPS Take 2,000 Units by mouth daily.       conjugated estrogens (PREMARIN) vaginal cream Place 1 Applicatorful vaginally daily as needed (irritation).      dorzolamide-timolol (COSOPT) 22.3-6.8 MG/ML ophthalmic solution Place 1 drop into both eyes daily.     doxepin (SINEQUAN) 10 MG capsule Take 10-20 mg by mouth at bedtime as needed (sleep).      famotidine (PEPCID) 20 MG tablet Take 20 mg by mouth at bedtime.     flecainide (TAMBOCOR) 50 MG tablet TAKE 2 TABLETS BY MOUTH TWO TIMES DAILY (Patient taking differently: Take 100 mg by mouth 2 (two) times daily. ) 360 tablet 3   gabapentin (NEURONTIN) 300 MG capsule Take 300 mg by mouth 3 (three) times daily.      hydrochlorothiazide (MICROZIDE) 12.5 MG capsule TAKE 1 CAPSULE BY MOUTH  DAILY (Patient taking differently: Take 12.5 mg by mouth daily. ) 90 capsule 3   lactobacillus acidophilus (BACID) TABS tablet Take 1 tablet by mouth daily.      latanoprost (XALATAN) 0.005 % ophthalmic solution Place 1 drop into both eyes at bedtime.   6   losartan (COZAAR) 50 MG tablet Take 50 mg by mouth daily.     Multiple Vitamin (MULTIVITAMIN WITH MINERALS) TABS tablet Take 1 tablet by mouth daily.     NON FORMULARY Apply 1 application topically 2 (two) times daily as needed (for eczema). Traimcinolone/[employer] Moist Cream     omeprazole (PRILOSEC) 40 MG capsule Take 40 mg by mouth daily.      oxybutynin (DITROPAN-XL) 10 MG 24 hr tablet Take 10 mg by mouth daily.      polyethylene glycol (MIRALAX / GLYCOLAX) 17 g packet Take 17 g by mouth See admin  instructions. 3-4 times a week     PRESCRIPTION MEDICATION Inhale into the lungs at bedtime. CPAP     warfarin (COUMADIN) 5 MG tablet Take 1 tablet (5 mg total) by mouth daily. Take daily as directed my anticoagulation clinic 30 tablet  0   No current facility-administered medications for this visit.     Allergies  Allergen Reactions   Amoxicillin Diarrhea    Has patient had a PCN reaction causing immediate rash, facial/tongue/throat swelling, SOB or lightheadedness with hypotension: no Has patient had a PCN reaction causing severe rash involving mucus membranes or skin necrosis: no Has patient had a PCN reaction that required hospitalization: no Has patient had a PCN reaction occurring within the last 10 years: no If all of the above answers are "NO", then may proceed with Cephalosporin use.    Metoprolol Other (See Comments)    Mouth tastes like mold   Metoprolol Tartrate     Other reaction(s): Other (See Comments) Mouth sores   Oxaprozin Nausea Only      Review of Systems:   General:  normaol appetite, decreased energy, no weight gain, no weight loss, no fever  Cardiac:  no chest pain with exertion, no chest pain at rest, +SOB with exertion, no resting SOB, no PND, + orthopnea, + palpitations, + arrhythmia, + atrial fibrillation, no LE edema, + dizzy spells, + syncope  Respiratory:  + chronic shortness of breath, no home oxygen, no productive cough, + chronic dry cough, no bronchitis, no wheezing, no hemoptysis, no asthma, no pain with inspiration or cough, + sleep apnea, + CPAP at night  GI:   no difficulty swallowing, + reflux, no frequent heartburn, no hiatal hernia, no abdominal pain, + chronic constipation, no diarrhea, no hematochezia, no hematemesis, no melena  GU:   no dysuria,  + frequency, no urinary tract infection, no hematuria, no kidney stones, no kidney disease  Vascular:  no pain suggestive of claudication, no pain in feet, no leg cramps, no varicose veins, no  DVT, no non-healing foot ulcer  Neuro:   no stroke, no TIA's, no seizures, no headaches, no temporary blindness one eye,  no slurred speech, no peripheral neuropathy, no chronic pain, no instability of gait, no memory/cognitive dysfunction  Musculoskeletal: + arthritis, no joint swelling, no myalgias, no difficulty walking, normal mobility   Skin:   + rash, + itching, no skin infections, no pressure sores or ulcerations  Psych:   + anxiety, no depression, no nervousness, no unusual recent stress  Eyes:   no blurry vision, + floaters, no recent vision changes, + wears glasses or contacts  ENT:   + hearing loss, no loose or painful teth, + partial dentures, last saw dentist January 2020  Hematologic:  + easy bruising, no abnormal bleeding, no clotting disorder, no frequent epistaxis  Endocrine:  no diabetes, does not check CBG's at homee     Physical Exam:   BP 140/67    Pulse 74    Temp 97.7 F (36.5 C) (Skin)    Resp 20    Ht 5' (1.524 m)    Wt 151 lb (68.5 kg)    SpO2 95% Comment: RA   BMI 29.49 kg/m   General:  Obese female NAD   HEENT:  Unremarkable   Neck:   no JVD, no bruits, no adenopathy   Chest:   clear to auscultation, symmetrical breath sounds, no wheezes, no rhonchi   CV:   RRR, no  murmur   Abdomen:  soft, non-tender, no masses   Extremities:  warm, well-perfused, pulses diminished, no LE edema  Rectal/GU  Deferred  Neuro:   Grossly non-focal and symmetrical throughout  Skin:   Clean and dry, no rashes, no breakdown   Diagnostic Tests:  EKG (  11/02/2018) NSR w/ 1st degree AV block      ECHOCARDIOGRAM REPORT     Patient Name:   Mary Farrell Date of Exam: 10/16/2018 Medical Rec #:  [id number]    Height:       60.0 in Accession #:    [id number]   Weight:       155.6 lb Date of Birth:  [birth year]   BSA:          1.68 m Patient Age:    89 years     BP:           155/69 mmHg Patient Gender: F            HR:           65 bpm. Exam Location:  Inpatient  Procedure: 2D  Echo  Indications:    syncope 780.2   History:        Patient has prior history of Echocardiogram examinations, most                 recent 06/23/2017. Arrythmias:Atrial Fibrillation Risk                 Factors:Hypertension.   Sonographer:    Johny Chess Referring Phys: [place]    1. Left ventricular ejection fraction, by visual estimation, is 60 to 65%. The left ventricle has normal function. Normal left ventricular size. There is no left ventricular hypertrophy.  2. Global right ventricle has normal systolic function.The right ventricular size is normal. No increase in right ventricular wall thickness.  3. Left atrial size was severely dilated.  4. Right atrial size was normal.  5. Small pericardial effusion.  6. Mild calcification of the posterior mitral valve leaflet(s).  7. Moderate thickening of the posterior mitral valve leaflet(s).  8. Moderately decreased mobility of the mitral valve leaflets.  9. The mitral valve is rheumatic. Mild mitral valve regurgitation. Moderate-severe mitral stenosis. 10. Mean transmitral gradient is 65mHg.     Viewed personally TEE from 2019 and value leaflets appear thin and non calcified. Harmonics on current echocardiogram may be causing artifactual thickening. 11. The tricuspid valve is normal in structure. Tricuspid valve regurgitation was not visualized by color flow Doppler. 12. The aortic valve is normal in structure. Aortic valve regurgitation is mild by color flow Doppler. Mild aortic valve sclerosis without stenosis. 13. The pulmonic valve was normal in structure. Pulmonic valve regurgitation is trivial by color flow Doppler. 14. Mildly elevated pulmonary artery systolic pressure. 15. The inferior vena cava is normal in size with greater than 50% respiratory variability, suggesting right atrial pressure of 3 mmHg. 16. Rheumatic mitral valve stenosis. Transmitral gradient has increased. Consider repeat TEE  and stress echo to evaluate transmitral gradient during exercise. May need a candidate for balloon valvuloplasty.  FINDINGS  Left Ventricle: Left ventricular ejection fraction, by visual estimation, is 60 to 65%. The left ventricle has normal function. There is no left ventricular hypertrophy. Normal left ventricular size.  Right Ventricle: The right ventricular size is normal. No increase in right ventricular wall thickness. Global RV systolic function is has normal systolic function. The tricuspid regurgitant velocity is 2.66 m/s, and with an assumed right atrial pressure  of 3 mmHg, the estimated right ventricular systolic pressure is mildly elevated at 31.3 mmHg.  Left Atrium: Left atrial size was severely dilated.  Right Atrium: Right atrial size was normal in size  Pericardium: A small pericardial effusion is present.  Mitral Valve: The mitral valve is rheumatic. There is moderate thickening of the posterior mitral valve leaflet(s). There is mild calcification of the posterior mitral valve leaflet(s). Moderately decreased mobility of the mitral valve leaflets.  Moderate-severe mitral valve stenosis by observation. MV peak gradient, 21.3 mmHg. Mild mitral valve regurgitation. Mean transmitral gradient is 56mHg. Viewed personally TEE from 2019 and value leaflets appear thin and non calcified. Harmonics on current echocardiogram may be causing artifactual thickening.  Tricuspid Valve: The tricuspid valve is normal in structure. Tricuspid valve regurgitation was not visualized by color flow Doppler.  Aortic Valve: The aortic valve is normal in structure. Aortic valve regurgitation is mild by color flow Doppler. Aortic regurgitation PHT measures 370 msec. Mild aortic valve sclerosis is present, with no evidence of aortic valve stenosis.  Pulmonic Valve: The pulmonic valve was normal in structure. Pulmonic valve regurgitation is trivial by color flow Doppler.  Aorta: The aortic  root, ascending aorta and aortic arch are all structurally normal, with no evidence of dilitation or obstruction.  Venous: The inferior vena cava is normal in size with greater than 50% respiratory variability, suggesting right atrial pressure of 3 mmHg.  IAS/Shunts: No atrial level shunt detected by color flow Doppler. No ventricular septal defect is seen or detected. There is no evidence of an atrial septal defect.   Additional Comments: Rheumatic mitral valve stenosis. Transmitral gradient has increased. Consider repeat TEE and stress echo to evaluate transmitral gradient during exercise. May need a candidate for balloon valvuloplasty.   LEFT VENTRICLE          Normals PLAX 2D LVIDd:         4.50 cm  3.6 cm   Diastology                 Normals LVIDs:         2.80 cm  1.7 cm   LV e' lateral:   7.07 cm/s 6.42 cm/s LV PW:         1.20 cm  1.4 cm   LV E/e' lateral: 29.8      15.4 LV IVS:        1.00 cm  1.3 cm   LV e' medial:    4.79 cm/s 6.96 cm/s LVOT diam:     1.90 cm  2.0 cm   LV E/e' medial:  44.1      6.96 LV SV:         63 ml    79 ml LV SV Index:   35.81    45 ml/m2 LVOT Area:     2.84 cm 3.14 cm2    RIGHT VENTRICLE RV S prime:     8.92 cm/s TAPSE (M-mode): 1.8 cm  LEFT ATRIUM             Index       RIGHT ATRIUM           Index LA diam:        5.30 cm 3.16 cm/m  RA Area:     15.20 cm LA Vol (A2C):   92.1 ml 54.89 ml/m RA Volume:   32.00 ml  19.07 ml/m LA Vol (A4C):   74.0 ml 44.10 ml/m LA Biplane Vol: 84.4 ml 50.30 ml/m  AORTIC VALVE             Normals LVOT Vmax:   79.60 cm/s LVOT Vmean:  56.100 cm/s 75 cm/s LVOT VTI:    0.194 m     25.3 cm  AI PHT:      370 msec   AORTA                 Normals Ao Root diam: 2.80 cm 31 mm  MITRAL VALVE               Normals   TRICUSPID VALVE             Normals MV Area (PHT): 1.57 cm              TR Peak grad:   28.3 mmHg MV Peak grad:  21.3 mmHg   4 mmHg    TR Vmax:        266.00 cm/s 288 cm/s MV Mean grad:  7.0  mmHg MV Vmax:       2.31 m/s              SHUNTS MV Vmean:      126.0 cm/s            Systemic VTI:  0.19 m MV VTI:        0.67 m                Systemic Diam: 1.90 cm MV PHT:        140.07 msec 55 ms MV Decel Time: 483 msec    187 ms MV E velocity: 211.00 cm/s 103 cm/s MV A velocity: 100.00 cm/s 70.3 cm/s MV E/A ratio:  2.11        1.5    [name] MD Electronically signed by [name] MD Signature Date/Time: 10/16/2018/1:30:25 PM       TRANSESOPHOGEAL ECHO REPORT       Patient Name:   Mary Farrell Date of Exam: 10/17/2018 Medical Rec #:  [id number]    Height:       60.0 in Accession #:    [id number]   Weight:       152.7 lb Date of Birth:  1945/01/27   BSA:          1.66 m Patient Age:    18 years     BP:           182/72 mmHg Patient Gender: F            HR:           69 bpm. Exam Location:  Inpatient    Procedure: Transesophageal Echo, Color Doppler, Cardiac Doppler and 3D Echo  Indications:     I05.0 Rheumatic mitral stenosis   History:         Patient has prior history of Echocardiogram examinations, most                  recent 10/16/2018. CHF; Arrythmias:Atrial Fibrillation Risk                  Factors:Hypertension and Sleep Apnea. Rheumatic Mitral                  Stenosis.   Sonographer:     Raquel Sarna Senior RDCS Referring Phys:  [id number] [facility] [place] Diagnosing Phys: [name] MD     PROCEDURE: Consent was requested emergently by emergency room physicain. Patients was under conscious sedation during this procedure. Anesthetic was administered intravenously by performing Physician: 60mg of Fentanyl, 5.036mof Versed. The  transesophogeal probe was passed through the esophogus of the patient. Image quality was excellent. The patient's vital signs; including heart rate, blood pressure, and oxygen saturation; remained stable throughout  the procedure. The patient developed no  complications during the procedure.  IMPRESSIONS    1. Left  ventricular ejection fraction, by visual estimation, is 55 to 60%. The left ventricle has normal function. Normal left ventricular size. There is no left ventricular hypertrophy.  2. Global right ventricle has normal systolic function.The right ventricular size is normal. No increase in right ventricular wall thickness.  3. Pulmonary hypertension.  4. Left atrial size was severely dilated.  5. No LAA thrombus.  6. Right atrial size was normal.  7. The mitral valve is rheumatic. Mild mitral valve regurgitation. Moderate-severe mitral stenosis. moderate mitral valve stenosis.  8. Moderate stenosis by pressure half time 1.2cm squared and mean gradient (61mHg). PISA radius 1.2cm, with calculated area of 1.2 cm squared.     Valve leaflets are thin, pliable, no subvalvular thickening, however surrounding P1 (posterior leaflet) there is a calcified fixed nodule (MAC like appearance).  9. The tricuspid valve is normal in structure. Tricuspid valve regurgitation is mild. 10. The aortic valve is tricuspid Aortic valve regurgitation is mild by color flow Doppler. Mild aortic valve sclerosis without stenosis. 11. The pulmonic valve was normal in structure. Pulmonic valve regurgitation is not visualized by color flow Doppler. 12. Moderately elevated pulmonary artery systolic pressure. 13. The inferior vena cava is normal in size with greater than 50% respiratory variability, suggesting right atrial pressure of 3 mmHg. 14. Consider stress echo with transmitral gradient measurements. 15. Her moderate to severely elevated pulmonary pressures may indicate along with AFIB and markedly dilated LA along with symptoms, that mitral valve replacement is indicated. 16. However pulmonary pressures are high (60-610mg). Although leaflets are thin, pliable and without subvalvular thickening, she does have a calcified density on P1 which would likely increase risk with balloon valvuloplasty. 17. Rheumatic moderate mitral  stenosis.  FINDINGS  Left Ventricle: Left ventricular ejection fraction, by visual estimation, is 55 to 60%. The left ventricle has normal function. There is no left ventricular hypertrophy. Normal left ventricular size.  Right Ventricle: The right ventricular size is normal. No increase in right ventricular wall thickness. Global RV systolic function is has normal systolic function. The tricuspid regurgitant velocity is 3.86 m/s, and with an assumed right atrial pressure  of 3 mmHg, the estimated right ventricular systolic pressure is moderately elevated at 62.6 mmHg. Findings are consistent with pulmonary hypertension.  Left Atrium: Left atrial size was severely dilated. No LAA thrombus.  Right Atrium: Right atrial size was normal in size  Pericardium: There is no evidence of pericardial effusion.  Mitral Valve: The mitral valve is rheumatic. Moderate-severe mitral valve stenosis by observation. Moderate mitral valve stenosis. Mitral valve area PHT measures 1.18 cm. MV peak gradient, 14.7 mmHg. Mild mitral valve regurgitation. Moderate stenosis by  pressure half time 1.2cm squared and mean gradient (15m61m). PISA radius 1.2cm, with calculated area of 1.2 cm squared. Valve leaflets are thin, pliable, no subvalvular thickening, however surrounding P1 (posterior leaflet) there is a calcified fixed nodule (MAC like appearance).  Tricuspid Valve: The tricuspid valve is normal in structure. Tricuspid valve regurgitation is mild by color flow Doppler.  Aortic Valve: The aortic valve is tricuspid. Aortic valve regurgitation is mild by color flow Doppler. Mild aortic valve sclerosis is present, with no evidence of aortic valve stenosis.  Pulmonic Valve: The pulmonic valve was normal in structure. Pulmonic valve regurgitation is not visualized by color flow Doppler.  Aorta: The aortic root, ascending aorta and aortic arch are all structurally normal, with no  evidence of dilitation or  obstruction.  Venous: The inferior vena cava is normal in size with greater than 50% respiratory variability, suggesting right atrial pressure of 3 mmHg.  Shunts: No ventricular septal defect is seen or detected. There is no evidence of an atrial septal defect. No atrial level shunt detected by color flow Doppler.  Additional Comments: Rheumatic moderate mitral stenosis. However pulmonary pressures are high (60-6mHg). Although leaflets are thin, pliable and without subvalvular thickening, she does have a calcified density on P1 which would likely increase risk  with balloon valvuloplasty. Consider stress echo with transmitral gradient measurements. Her moderate to severely elevated pulmonary pressures may indicate along with AFIB and markedly dilated LA along with symptoms, that mitral valve replacement is  indicated.   MITRAL VALVE               Normals TRICUSPID VALVE             Normals MV Area (PHT): 1.18 cm            TR Peak grad:   59.6 mmHg MV Peak grad:  14.7 mmHg   4 mmHg  TR Vmax:        388.00 cm/s 288 cm/s MV Mean grad:  8.0 mmHg MV Vmax:       1.92 m/s MV Vmean:      135.0 cm/s MV VTI:        0.61 m MV PHT:        186.00 msec 55 ms MR Peak grad:    14.9 mmHg MR Mean grad:    9.0 mmHg MR Vmax:         193.00 cm/s MR Vmean:        144.0 cm/s MR PISA:         9.05 cm MR PISA Eff ROA: 120 mm MR PISA Radius:  1.20 cm    MCandee FurbishMD Electronically signed by MCandee FurbishMD Signature Date/Time: 10/17/2018/2:13:43 PM     RIGHT/LEFT HEART CATH AND CORONARY ANGIOGRAPHY  Conclusion    Hemodynamic findings consistent with mild pulmonary hypertension.  LV end diastolic pressure is normal.  There is severe mitral valve stenosis -suggested by a echocardiogram, large PCWP V waves along with elevated PCWP but normal LVEDP would also suggest this.  Angiographically normal coronary arteries   SUMMARY  Angiographically normal coronary arteries  Mild pulmonary  hypertension with mildly elevated PCWP but normal LVEDP suggestive of mitral stenosis  Significant V wave noted on PCWP waveform suggesting mitral stenosis.  RECOMMENDATIONS  Return to nursing unit for ongoing care.    Proceed with plans for mitral valve replacement    DGlenetta Hew M.D., M.S. Interventional Cardiologist   Pager # [phone] # [phone] [address] [place] [id number]    Recommendations  Antiplatelet/Anticoag No indication for antiplatelet therapy at this time .  Surgeon Notes    10/17/2018 1:06 PM CV Procedure addendum by SJerline Pain MD  Indications  Severe mitral stenosis by prior echocardiogram [I05.0 (ICD-10-CM)]  Procedural Details  Technical Details PCP: RLawerance Cruel MD Referring Cardiologist: Dr. TSkeet Latch 73y.o. 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  She was found to have severe mitral stenosis by echocardiogram during this hospitalization and is referred for right and of heart catheterization as part of pre-mitral valve replacement surgery.  Time Out: Verified patient identification, verified procedure, site/side was marked, verified correct patient position, special equipment/implants available, medications/allergies/relevent history reviewed, required imaging and test results available. Performed.   Access:  Right radial Artery: 6 Fr sheath -- Seldinger technique using Micropuncture Kit;   -- Direct ultrasound guidance used.  Permanent image obtained and placed on chart. -- 10 mL radial cocktail IA x 2 (total of 5 mg verapamil);  --> Despite attempting to use a Glidewire and 4 [place] catheter, I was not able to direct the catheter and wire into the main channel radial artery into the brachial artery.   After multiple attempts, chose to abandon radial access and switched to femoral access. *Right Common Femoral Artery: 4 Fr Sheath - fluoroscopically guided modified Seldinger Technique -- Direct ultrasound guidance used.  Permanent image obtained and placed on chart.  * Right Brachial/Antecubital Vein: The existing 18-gauge IV was exchanged over a wire for a 5Fr sheath  Right Heart Catheterization: 5 Fr Gordy Councilman catheter advanced under fluoroscopy with balloon inflated to the RA, RV, then PCWP-PA for hemodynamic measurement.  * Simultaneous FA & PA blood gases checked for SaO2% to calculate FICK CO/CI  * Catheter removed completely out of the body with balloon deflated.  Left Heart Catheterization: 4 Fr Catheters advanced or exchanged over a J-wire under direct fluoroscopic guidance into the ascending aorta; JL4 catheter advanced first.  * LV Hemodynamics (no LV Gram): JL4 catheter * Left Coronary Artery Cineangiography: JL4 catheter  * Right Coronary Artery  Cineangiography: JR4 catheter   Upon completion of Angiogaphy, the catheter was removed completely out of the body over a wire, without complication.  Femoral & Brachial Sheath(s) removed in the Cath Lab with manual pressure for hemostasis.   Radial sheath removed in the Cardiac Catheterization lab with TR Band placed for hemostasis (prior to femoral access).  TR Band: 1120  Hours; 11 mL air   MEDICATIONS * SQ Lidocaine 25m * Radial Cocktail: 3 mg Verapmil in 10 mL NS * Isovue Contrast: 40  Fluoro time: 14.7 minutes. Dose Area Product: 5.6 Gycm2. Cumulative Air Kerma: 110.5 mGy.   Estimated blood loss <50 mL.   During this procedure medications were administered to achieve and maintain moderate conscious sedation while the patient's heart rate, blood pressure, and oxygen saturation were continuously monitored and I was present face-to-face 100% of this time.  Medications (Filter: Administrations occurring from 10/18/18 1002  to 10/18/18 1158) (important)  Continuous medications are totaled by the amount administered until 10/18/18 1158.  Medication Rate/Dose/Volume Action  Date Time   fentaNYL (SUBLIMAZE) injection (mcg) 25 mcg Given 10/18/18 1026   Total dose as of 10/18/18 1158 50 mcg Given 1105   75 mcg        midazolam (VERSED) injection (mg) 1 mg Given 10/18/18 1026   Total dose as of 10/18/18 1158        1 mg        Radial Cocktail/Verapamil only (mL) 10 mL Given 10/18/18 1035   Total dose as of 10/18/18 1158        10 mL        lidocaine (PF) (XYLOCAINE) 1 % injection (mL) 2 mL Given 10/18/18 1032   Total dose as of 10/18/18 1158 2 mL Given 1035   19 mL 15 mL Given 1121   Radial Cocktail/Verapamil only (mL) 10 mL Given 10/18/18 1052   Total dose as of 10/18/18 1158        10 mL  iohexol (OMNIPAQUE) 350 MG/ML injection (mL) 40 mL Given 10/18/18 1149   Total dose as of 10/18/18 1158        40 mL        0.9 % sodium chloride infusion (mL) *Not included in total [facility] Hold 10/18/18 1003   Dosing weight:  70 kg        Total dose as of 10/18/18 1158        Cannot be calculated        acetaminophen (TYLENOL) tablet 500 mg (mg) *Not included in total [facility] Hold 10/18/18 1002   Dosing weight:  70 kg        Total dose as of 10/18/18 1158        Cannot be calculated        ALPRAZolam (XANAX) tablet 0.125 mg (mg) *Not included in total [facility] Hold 10/18/18 1003   Dosing weight:  70 kg        Total dose as of 10/18/18 1158        Cannot be calculated        carvedilol (COREG) tablet 6.25 mg (mg) *Not included in total [facility] Hold 10/18/18 1003   Dosing weight:  69.3 kg        Total dose as of 10/18/18 1158        Cannot be calculated        doxepin (SINEQUAN) capsule 10-20 mg (mg) *Not included in total [facility] Hold 10/18/18 1003   Total dose as of 10/18/18 1158        Cannot be calculated        famotidine (PEPCID) tablet 20 mg (mg) *Not included in total [facility] Hold 10/18/18 1003   Dosing weight:  70 kg         Total dose as of 10/18/18 1158        Cannot be calculated        flecainide (TAMBOCOR) tablet 100 mg (mg) *Not included in total [facility] Hold 10/18/18 1002   Total dose as of 10/18/18 1158        Cannot be calculated        gabapentin (NEURONTIN) capsule 300 mg (mg) *Not included in total [facility] Hold 10/18/18 1003   Total dose as of 10/18/18 1158        Cannot be calculated        hydrochlorothiazide (MICROZIDE) capsule 12.5 mg (mg) *Not included in total [facility] Hold 10/18/18 1002   Total dose as of 10/18/18 1158        Cannot be calculated        loratadine (CLARITIN) tablet 10 mg (mg) *Not included in total [facility] Hold 10/18/18 1003   Dosing weight:  70 kg        Total dose as of 10/18/18 1158        Cannot be calculated        losartan (COZAAR) tablet 50 mg (mg) *Not included in total [facility] Hold 10/18/18 1003   Dosing weight:  69.3 kg        Total dose as of 10/18/18 1158        Cannot be calculated        ondansetron (ZOFRAN) injection 4 mg (mg) *Not included in total [facility] Hold 10/18/18 1003   Dosing weight:  69.3 kg        Total dose as of 10/18/18 1158        Cannot be calculated  oxybutynin (DITROPAN-XL) 24 hr tablet 10 mg (mg) *Not included in total [facility] Hold 10/18/18 1003   Total dose as of 10/18/18 1158        Cannot be calculated        pantoprazole (PROTONIX) EC tablet 40 mg (mg) *Not included in total [facility] Hold 10/18/18 1003   Dosing weight:  70 kg        Total dose as of 10/18/18 1158        Cannot be calculated        polyethylene glycol (MIRALAX / GLYCOLAX) packet 17 g (g) *Not included in total [facility] Hold 10/18/18 1003   Dosing weight:  70 kg        Total dose as of 10/18/18 1158        Cannot be calculated        sodium chloride flush (NS) 0.9 % injection 3 mL (mL) *Not included in total [facility] Hold 10/18/18 1002   Dosing weight:  70 kg        Total dose as of 10/18/18 1158        Cannot be calculated        sodium chloride flush (NS) 0.9 % injection 3 mL (mL) *Not included  in total [facility] Hold 10/18/18 1003   Dosing weight:  70 kg        Total dose as of 10/18/18 1158        Cannot be calculated        sodium chloride flush (NS) 0.9 % injection 3 mL (mL) *Not included in total [facility] Hold 10/18/18 1003   Dosing weight:  70 kg        Total dose as of 10/18/18 1158        Cannot be calculated        sodium chloride flush (NS) 0.9 % injection 3 mL (mL) *Not included in total [facility] Hold 10/18/18 1003   Dosing weight:  70 kg        Total dose as of 10/18/18 1158        Cannot be calculated        sodium chloride flush (NS) 0.9 % injection 3 mL (mL) *Not included in total [facility] Hold 10/18/18 1003   Dosing weight:  69.3 kg        Total dose as of 10/18/18 1158        Cannot be calculated        traMADol (ULTRAM) tablet 50 mg (mg) *Not included in total MAR Hold 10/18/18 1003   Dosing weight:  70 kg        Total dose as of 10/18/18 1158        Cannot be calculated        Sedation Time  Sedation Time Physician-1: 1 hour 13 minutes 42 seconds  Contrast  Medication Name Total Dose  iohexol (OMNIPAQUE) 350 MG/ML injection 40 mL    Radiation/Fluoro  Fluoro time: 14.7 (min) DAP: 5.6 (Gycm2) Cumulative Air Kerma: 875.6 (mGy)  Complications  Complications documented before study signed (10/18/2018 43:32 PM)   No complications were associated with this study.  Documented by Leonie Man, MD - 10/18/2018 11:52 AM    Coronary Findings  Diagnostic Dominance: Right Left Main  Vessel was injected. Vessel is normal in caliber and large. Vessel is angiographically normal.  Left Anterior Descending  Vessel was injected. Vessel is normal in caliber and large. Vessel is angiographically normal.  First Diagonal Branch  Vessel  is small in size.  Second Diagonal Branch  Vessel is small in size.  Third Diagonal Branch  Vessel is small in size.  Left Circumflex  Vessel was injected. Vessel is normal in caliber and large. Vessel is angiographically normal.  Left  Posterior Atrioventricular Artery  Vessel is small in size.  Right Coronary Artery  Vessel was injected. Vessel is normal in caliber and large. Vessel is angiographically normal.  Right Ventricular Branch  Vessel is small in size.  First Right Posterolateral Branch  Vessel is small in size.  Second Right Posterolateral Branch  Vessel is small in size.  Intervention  No interventions have been documented. Right Heart  Right Heart Pressures Hemodynamic findings consistent with mild pulmonary hypertension. PA P 49/15 mmHg-mean 38 mmHg PCWP 17 million mercury with a V wave of 27 mmHg LV EDP is normal. Mild: LV P-EDP 151/4 mmHg - 9 mmHg AOP-MAP: 150/54 mmHg - 90 mmHg  Ao sat 99%, PA sat 80%.  FICK CARDIAC OUTPUT-INDEX: 6.78-4.05  Right Atrium Right atrial pressure is normal. RAP 2 mmHg  Right Ventricle RVP-EDP 50/1 mmHg-4 mmHg  Wall Motion  Resting    No LV gram        Left Heart  Mitral Valve There is severe mitral valve stenosis. By echo Calcified  Coronary Diagrams  Diagnostic Dominance: Right  Intervention  Implants   No implant documentation for this case.  Syngo Images  Show images for CARDIAC CATHETERIZATION  Images on Long Term Storage  Show images for Antigone, [place] to Procedure Log  Procedure Log    Hemo Data   Most Recent Value  Fick Cardiac Output 6.78 L/min  Fick Cardiac Output Index 4.05 (L/min)/BSA  RA A Wave 2 mmHg  RA V Wave 3 mmHg  RA Mean 1 mmHg  RV Systolic Pressure 50 mmHg  RV Diastolic Pressure 0 mmHg  RV EDP 4 mmHg  PA Systolic Pressure 49 mmHg  PA Diastolic Pressure 15 mmHg  PA Mean 30 mmHg  PW A Wave 16 mmHg  PW V Wave 27 mmHg  PW Mean 17 mmHg  AO Systolic Pressure 798 mmHg  AO Diastolic Pressure 57 mmHg  AO Mean 95 mmHg  LV Systolic Pressure 921 mmHg  LV Diastolic Pressure 4 mmHg  LV EDP 9 mmHg  AOp Systolic Pressure 194 mmHg  AOp Diastolic Pressure 51 mmHg  AOp Mean Pressure 86 mmHg  LVp Systolic Pressure 174  mmHg  LVp Diastolic Pressure 3 mmHg  LVp EDP Pressure 8 mmHg  QP/QS 1  TPVR Index 7.4 HRUI  TSVR Index 23.43 HRUI  PVR SVR Ratio 0.14  TPVR/TSVR Ratio 0.32      CT ANGIOGRAPHY CHEST WITH CONTRAST  TECHNIQUE: Multidetector CT imaging of the chest was performed using the standard protocol during bolus administration of intravenous contrast. Multiplanar CT image reconstructions and MIPs were obtained to evaluate the vascular anatomy.  CONTRAST:  100 cc Isovue  COMPARISON:  None.  FINDINGS: Images of the thoracic inlet are unremarkable. No mediastinal hematoma or adenopathy.  The study is of excellent technical quality. No pulmonary embolus is noted. There is moderate pericardial effusion measures 1.4 cm thickness posteriorly. No anterior pericardial effusion.  No hilar adenopathy is noted. Images of the lung parenchyma shows no pulmonary edema. Mild geographic ground-glass parenchymal attenuation most likely due to atelectasis. There is atelectasis or infiltrate in left lower lobe posteriorly retrocardiac.  Central airways are patent.  No destructive rib lesions are noted. The  patient is status postcholecystectomy. No adrenal gland mass is noted.  Review of the MIP images confirms the above findings.  IMPRESSION: 1. No pulmonary embolus is noted. 2. No mediastinal hematoma or adenopathy. 3. There is moderate size pericardial effusion measures 1.4 cm in thickness posteriorly. 4. There is atelectasis or infiltrate in left lower lobe posteriorly retrocardiac. 5. No pulmonary edema.  Mild atelectasis bilaterally.   Electronically Signed   By: Lahoma Crocker M.D.   On: 09/18/2015 15:21   CT ABDOMEN AND PELVIS WITH CONTRAST  TECHNIQUE: Multidetector CT imaging of the abdomen and pelvis was performed using the standard protocol following bolus administration of intravenous contrast.  CONTRAST:  160m ISOVUE-300  COMPARISON:   03/04/2017  FINDINGS: Lower chest: Small right-sided pleural effusion is noted. Mild bibasilar atelectatic changes are noted left greater than right. Cardiac enlargement is noted. Pericardial effusion is noted as well similar to that seen on prior CCTA  Hepatobiliary: Diffuse decreased attenuation is identified consistent with fatty infiltration. The gallbladder has been surgically removed.  Pancreas: Unremarkable. No pancreatic ductal dilatation or surrounding inflammatory changes.  Spleen: Normal in size without focal abnormality.  Adrenals/Urinary Tract: Adrenal glands are within normal limits bilaterally. The kidneys are well visualize without renal calculi or obstructive changes. Bladder is partially distended.  Stomach/Bowel: The appendix has been surgically removed. No obstructive or inflammatory changes are identified.  Vascular/Lymphatic: Aortic atherosclerosis. No enlarged abdominal or pelvic lymph nodes.  Reproductive: Status post hysterectomy. No adnexal masses.  Other: No abdominal wall hernia or abnormality. No abdominopelvic ascites.  Musculoskeletal: Degenerative changes of lumbar spine are noted.  IMPRESSION: Bibasilar atelectasis and right-sided pleural effusion.  Stable cardiac enlargement and pericardial effusion.  Chronic changes as described above.   Electronically Signed   By: MInez CatalinaM.D.   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 fib ablation in May of this year and previous history of recurrent persistent atrial fibrillation requiring DC cardioversion on multiple occasions.  The patient describes stable symptoms of exertional shortness of breath and fatigue consistent with chronic diastolic congestive heart failure, New [place] Heart Association  functional class III.  She was recently hospitalized following a brief syncopal event that may have been related to increased dose of beta-blocker following an episode of paroxysmal atrial fibrillation.  I have personally reviewed the patient's transthoracic and transesophageal echocardiograms which demonstrate the presence of rheumatic appearance of mitral valve with severe mitral stenosis and mild mitral regurgitation.  Mean transvalvular gradient across mitral valve was estimated 8 mmHg but the mitral valve area was notably estimated only 1.2 cm with pressure half-time reported 140 ms on transthoracic echocardiogram and 186 ms on transesophageal echocardiogram.  There is at least mild central aortic insufficiency.  There is an eccentric area of severe calcification in the mitral valve which would preclude primary balloon valvuloplasty as an option for treatment.  Left ventricular size and systolic function remains normal.  There is severe left atrial enlargement.  Right ventricular size and function is normal.  There is mild central tricuspid regurgitation.  Catheterization is notable for the absence of significant coronary artery disease.  There is mild pulmonary hypertension.  CT angiogram of the chest performed in 2017 reveals no signs of significant atherosclerotic disease involving the thoracic aorta and CT scan of the abdomen and pelvis with intravenous contrast performed more  recently in 2019 reveals only mild atherosclerotic disease involving the abdominal aorta and iliac vessels.  At present the patient is maintaining sinus rhythm although she has had at least 2 episodes of recurrent paroxysmal atrial fibrillation in the recent past.  I agree the patient would benefit from mitral valve replacement and she would likely additionally benefit from a concomitant Maze procedure.  She appears to be an acceptable candidate for minimally invasive approach for surgery.    Plan:  The patient and her  husband were again counseled at length regarding the indications, risks and potential benefits of mitral valve replacement.  The rationale for elective surgery has been explained, including a comparison between surgery and continued medical therapy with close follow-up.  The relative risks and benefits of performing a maze procedure at the time of her surgery was discussed at length, including the expected likelihood of long term freedom from recurrent symptomatic atrial fibrillation and/or atrial flutter.  Alternative surgical approaches have been discussed including a comparison between conventional sternotomy and minimally-invasive techniques.  The relative risks and benefits of each have been reviewed as they pertain to the patient's specific circumstances, and expectations for the patient's postoperative convalescence has been discussed.  We discussed the possibility of replacing the mitral valve using a mechanical prosthesis with the attendant need for long-term anticoagulation versus the alternative of replacing it using a bioprosthetic tissue valve with its potential for late structural valve deterioration and failure, depending upon the patient's longevity.     At this point the patient is leaning towards mitral valve replacement using bioprosthetic tissue valve, although she remains somewhat undecided.  She has decided that she wishes to proceed with surgery in the near future, but she wants to wait until after her upcoming birthday.  We tentatively plan to proceed with surgery on December 13, 2018.  The patient has been instructed to stop taking Lovenox on December 07, 2018 and she will begin intermittent Lovenox shots for bridging therapy on December 09, 2018.  She will return to our office for follow-up prior to surgery on December 11, 2018.  All questions answered.    I spent in excess of 60 minutes during the conduct of this office consultation and >50% of this time involved direct face-to-face  encounter with the patient for counseling and/or coordination of their care.    [name]. Roxy Manns, MD 11/16/2018 1:50 PM

## 2018-11-16 NOTE — Telephone Encounter (Signed)
Discussed monitor.  Patient has applied monitor.  Dr. Roxy Manns confirmed application looked correct when she had her visit today.  Patient instructed to return [employer] call for her activation interview.

## 2018-11-16 NOTE — Telephone Encounter (Signed)
Follow Up:      Please call, pt says she have some questions about her Monitor she received.

## 2018-11-16 NOTE — Telephone Encounter (Signed)
Left instructions for ZIO ZT long term live telemetry monitor.  Patient to call [employer] at [phone] for her activation introduction.  They will answer any additional questions at that time.

## 2018-11-16 NOTE — Patient Instructions (Signed)
Stop taking Coumadin on Thursday November 12 and begin Lovenox injections on Saturday November 14  Continue taking all other medications without change through the day before surgery.  Have nothing to eat or drink after midnight the night before surgery.

## 2018-11-17 ENCOUNTER — Telehealth: Payer: Self-pay | Admitting: Cardiology

## 2018-11-17 ENCOUNTER — Ambulatory Visit (INDEPENDENT_AMBULATORY_CARE_PROVIDER_SITE_OTHER): Payer: Medicare Other | Admitting: Pharmacist Clinician (PhC)/ Clinical Pharmacy Specialist

## 2018-11-17 ENCOUNTER — Telehealth: Payer: Self-pay | Admitting: Cardiovascular Disease

## 2018-11-17 DIAGNOSIS — I480 Paroxysmal atrial fibrillation: Secondary | ICD-10-CM

## 2018-11-17 DIAGNOSIS — Z7901 Long term (current) use of anticoagulants: Secondary | ICD-10-CM

## 2018-11-17 LAB — POCT INR: INR: 2.3 (ref 2.0–3.0)

## 2018-11-17 NOTE — Telephone Encounter (Signed)
° °  Patient c/o Palpitations:  High priority if patient c/o lightheadedness, shortness of breath, or chest pain  1) How long have you had palpitations/irregular HR/ Afib? Are you having the symptoms now? Yes  2) Are you currently experiencing lightheadedness, SOB or CP? SOB  3) Do you have a history of afib (atrial fibrillation) or irregular heart rhythm? yes  4) Have you checked your BP or HR? (document readings if available):       138/77 HR 91  HR is normally in the 50-60 range. BP, especially lower number, is high for her. Pt did report taking half of a xanax to help calm herself down   5) Are you experiencing any other symptoms? A little SOB  [employer] called the patient to ask how the patient was doing, which the call may have been triggered by the afib and elevated HR . Pt is expecting a call from the hospital pharmacy. If you can not reach her on her land line you can her land line, try her cell phone.

## 2018-11-17 NOTE — Telephone Encounter (Signed)
[employer] calling with abnormal EKG

## 2018-11-17 NOTE — Telephone Encounter (Signed)
Received call from [facility] reporting: 1:30 pm EST, AFib noted, rate b/w 80-110. Pt reporting SOB the last few hours

## 2018-11-17 NOTE — Telephone Encounter (Signed)
Spoke with pt, she thinks she went into atrial fib about 12:30- 1 pm today. She feels her heart not beating right and SOB all the time. While on the phone with the patient she reported someone from [facility]care was calling her. We ended our call and I will call her back.

## 2018-11-17 NOTE — Telephone Encounter (Addendum)
Spoke with patient and she has spoke with Sherri. She is actually feeling a little better, took a Xanax. No further recommendations.

## 2018-11-17 NOTE — Telephone Encounter (Signed)
Followed up with pt. Pt reports that she is in AFib, HRs higher than usual and that is why she is SOB.  She has been in AFib for the past several hours. Reports that this is only the 3rd time since her last ablation that she has experienced AFib. Currently:  BP 143/81, HR 87 She reports that normal HR avg 60s. So when her HR is 80s and above she can feel it. No other symptoms.  Denies dizziness/light-headedness, edema, near-syncope/syncope. Pt advised to continue to monitor for now.  Advised to call office if she remains out of rhythm for more than 24 hours and/or symptoms worsen/new ones begin. Patient verbalized understanding and agreeable to plan.   Will forward to Dr. Curt Bears for his [facility]

## 2018-11-24 ENCOUNTER — Ambulatory Visit (INDEPENDENT_AMBULATORY_CARE_PROVIDER_SITE_OTHER): Payer: Medicare Other | Admitting: Pharmacist Clinician (PhC)/ Clinical Pharmacy Specialist

## 2018-11-24 ENCOUNTER — Other Ambulatory Visit: Payer: Self-pay

## 2018-11-24 DIAGNOSIS — I483 Typical atrial flutter: Secondary | ICD-10-CM | POA: Diagnosis not present

## 2018-11-24 DIAGNOSIS — Z7901 Long term (current) use of anticoagulants: Secondary | ICD-10-CM

## 2018-11-24 DIAGNOSIS — I480 Paroxysmal atrial fibrillation: Secondary | ICD-10-CM

## 2018-11-24 LAB — POCT INR: INR: 3.5 — AB (ref 2.0–3.0)

## 2018-11-30 ENCOUNTER — Telehealth: Payer: Self-pay | Admitting: Cardiovascular Disease

## 2018-11-30 NOTE — Telephone Encounter (Signed)
Patient needs assistance with her Lovenox bridge. Please advise

## 2018-11-30 NOTE — Telephone Encounter (Signed)
We talked to patient this morning. She has appt with coumadin clinic next week to discuss bridging.

## 2018-11-30 NOTE — Telephone Encounter (Signed)
Called pt explained to call her surgeon and inform them her her illness and find out if they still want to do the procedure and to call us back here at the coumadin clinic so we can follow up

## 2018-12-01 ENCOUNTER — Ambulatory Visit (INDEPENDENT_AMBULATORY_CARE_PROVIDER_SITE_OTHER): Payer: Medicare Other | Admitting: Pharmacist Clinician (PhC)/ Clinical Pharmacy Specialist

## 2018-12-01 ENCOUNTER — Other Ambulatory Visit: Payer: Self-pay

## 2018-12-01 DIAGNOSIS — Z7901 Long term (current) use of anticoagulants: Secondary | ICD-10-CM | POA: Diagnosis not present

## 2018-12-01 DIAGNOSIS — I480 Paroxysmal atrial fibrillation: Secondary | ICD-10-CM | POA: Diagnosis not present

## 2018-12-01 LAB — POCT INR: INR: 1.7 — AB (ref 2.0–3.0)

## 2018-12-01 MED ORDER — WARFARIN SODIUM 5 MG PO TABS: ORAL_TABLET | ORAL | 0 refills | Status: DC

## 2018-12-01 MED ORDER — ENOXAPARIN SODIUM 60 MG/0.6ML ~~LOC~~ SOLN: 60.0000 mg | Freq: Two times a day (BID) | SUBCUTANEOUS | 0 refills | Status: DC

## 2018-12-01 NOTE — Patient Instructions (Signed)
Nov/12: Last dose of Coumadin.  Nov/13: No Coumadin or Lovenox.  Nov/14: Inject Lovenox 60 mg in the fatty abdominal tissue at least 2 inches from the belly button twice a day about 12 hours apart, 9am and 9pm rotate sites. No Coumadin.  Nov/15: Inject Lovenox in the fatty tissue every 12 hours, 9am and 9pm. No Coumadin.  Nov/16: Inject Lovenox in the fatty tissue every 12 hours, 9am and 9pm. No Coumadin.  Nov/17: Inject Lovenox in the fatty tissue in the morning at 9 am (No PM dose). No Coumadin.  Nov/18: Procedure Day - No Lovenox - Resume Coumadin as directed by doctor

## 2018-12-01 NOTE — Progress Notes (Signed)
Nov/12: Last dose of Coumadin.  Nov/13: No Coumadin or Lovenox.  Nov/14: Inject Lovenox 60 mg in the fatty abdominal tissue at least 2 inches from the belly button twice a day about 12 hours apart, 9am and 9pm rotate sites. No Coumadin.  Nov/15: Inject Lovenox in the fatty tissue every 12 hours, 9am and 9pm. No Coumadin.  Nov/16: Inject Lovenox in the fatty tissue every 12 hours, 9am and 9pm. No Coumadin.  Nov/17: Inject Lovenox in the fatty tissue in the morning at 9 am (No PM dose). No Coumadin.  Nov/18: Procedure Day - No Lovenox - Resume Coumadin as directed by doctor

## 2018-12-08 NOTE — Progress Notes (Signed)
[id number] [address], [place] [address] Phone: [phone] Fax: [phone]  [place], [facility] [address] [place] [address] [place] [address] Phone: [phone] Fax: [place], [place] - [address] [place] [address] Phone: [phone] Fax: [phone]      Your procedure is scheduled on November 18  Report to [facility] Main Entrance "A" at [place] and check in at the Admitting office.  Call this number if you have problems the morning of surgery:  [phone]  Call [phone] if you have any questions prior to your surgery date Monday-Friday 8am-4pm    Remember:  Do not eat or drink after midnight the night before your surgery     Take these medicines the morning of surgery with A SIP OF WATER  Tylenol (acetaminophen) if needed ALPRAZolam Duanne Moron) if needed carvedilol (COREG) Eye drops if needed flecainide (TAMBOCOR) gabapentin (NEURONTIN)  omeprazole (PRILOSEC) oxybutynin (DITROPAN-XL)  Continue to hold Coumadin, Continue to follow Lovenox Bridge instructions  7 days prior to surgery STOP taking any Aspirin (unless otherwise instructed by your surgeon), Aleve, Naproxen, Ibuprofen, Motrin, Advil, Goody's, BC's, all herbal medications, fish oil, and all vitamins.    The Morning of Surgery  Do not wear jewelry, make-up or nail polish.  Do not wear lotions, powders, or perfumes/colognes, or deodorant  Men may shave face and neck.  Do not bring valuables to the hospital.  [facility] is not responsible for any belongings or valuables.  If you are a smoker, DO NOT Smoke 24 hours prior to surgery IF you wear a CPAP at night please bring your mask, tubing, and machine the morning of surgery   Remember that you must have someone to transport you home after your  surgery, and remain with you for 24 hours if you are discharged the same day.   Contacts, glasses, hearing aids, dentures or bridgework may not be worn into surgery.    Leave your suitcase in the car.  After surgery it may be brought to your room.  For patients admitted to the hospital, discharge time will be determined by your treatment team.  Patients discharged the day of surgery will not be allowed to drive home.    Special instructions:   [facility]- Preparing For Surgery  Before surgery, you can play an important role. Because skin is not sterile, your skin needs to be as free of germs as possible. You can reduce the number of germs on your skin by washing with CHG (chlorahexidine gluconate) Soap before surgery.  CHG is an antiseptic cleaner which kills germs and bonds with the skin to continue killing germs even after washing.    Oral Hygiene is also important to reduce your risk of infection.  Remember - BRUSH YOUR TEETH THE MORNING OF SURGERY WITH YOUR REGULAR TOOTHPASTE  Please do not use if you have an allergy to CHG or antibacterial soaps. If your skin becomes reddened/irritated stop using the CHG.  Do not shave (including legs and underarms) for at least 48 hours prior to first CHG shower. It is OK to shave your face.  Please follow these instructions carefully.   1. Shower the NIGHT BEFORE SURGERY and the MORNING OF SURGERY with CHG Soap.   2. If you chose to wash your hair, wash your hair first as  usual with your normal shampoo.  3. After you shampoo, rinse your hair and body thoroughly to remove the shampoo.  4. Use CHG as you would any other liquid soap. You can apply CHG directly to the skin and wash gently with a scrungie or a clean washcloth.   5. Apply the CHG Soap to your body ONLY FROM THE NECK DOWN.  Do not use on open wounds or open sores. Avoid contact with your eyes, ears, mouth and genitals (private parts). Wash Face and genitals (private parts)  with  your normal soap.   6. Wash thoroughly, paying special attention to the area where your surgery will be performed.  7. Thoroughly rinse your body with warm water from the neck down.  8. DO NOT shower/wash with your normal soap after using and rinsing off the CHG Soap.  9. Pat yourself dry with a CLEAN TOWEL.  10. Wear CLEAN PAJAMAS to bed the night before surgery, wear comfortable clothes the morning of surgery  11. Place CLEAN SHEETS on your bed the night of your first shower and DO NOT SLEEP WITH PETS.    Day of Surgery:  Do not apply any deodorants/lotions. Please shower the morning of surgery with the CHG soap  Please wear clean clothes to the hospital/surgery center.   Remember to brush your teeth WITH YOUR REGULAR TOOTHPASTE.   Please read over the following fact sheets that you were given.

## 2018-12-11 ENCOUNTER — Encounter (HOSPITAL_COMMUNITY): Payer: Self-pay

## 2018-12-11 ENCOUNTER — Ambulatory Visit (INDEPENDENT_AMBULATORY_CARE_PROVIDER_SITE_OTHER): Payer: Medicare Other | Admitting: Thoracic Surgery (Cardiothoracic Vascular Surgery)

## 2018-12-11 ENCOUNTER — Encounter (HOSPITAL_COMMUNITY)
Admission: RE | Admit: 2018-12-11 | Discharge: 2018-12-11 | Disposition: A | Discharge status: Home / Self Care | Payer: Medicare Other | Source: Ambulatory Visit | Attending: Thoracic Surgery (Cardiothoracic Vascular Surgery) | Admitting: Thoracic Surgery (Cardiothoracic Vascular Surgery)

## 2018-12-11 ENCOUNTER — Ambulatory Visit (HOSPITAL_COMMUNITY)
Admission: RE | Admit: 2018-12-11 | Discharge: 2018-12-11 | Disposition: A | Discharge status: Home / Self Care | Payer: Medicare Other | Source: Ambulatory Visit | Attending: Thoracic Surgery (Cardiothoracic Vascular Surgery) | Admitting: Thoracic Surgery (Cardiothoracic Vascular Surgery)

## 2018-12-11 ENCOUNTER — Ambulatory Visit (HOSPITAL_BASED_OUTPATIENT_CLINIC_OR_DEPARTMENT_OTHER)
Admission: RE | Admit: 2018-12-11 | Discharge: 2018-12-11 | Disposition: A | Discharge status: Home / Self Care | Payer: Medicare Other | Source: Ambulatory Visit | Attending: Thoracic Surgery (Cardiothoracic Vascular Surgery) | Admitting: Thoracic Surgery (Cardiothoracic Vascular Surgery)

## 2018-12-11 ENCOUNTER — Other Ambulatory Visit (HOSPITAL_COMMUNITY)
Admission: RE | Admit: 2018-12-11 | Discharge: 2018-12-11 | Disposition: A | Discharge status: Home / Self Care | Payer: Medicare Other | Source: Ambulatory Visit | Attending: Thoracic Surgery (Cardiothoracic Vascular Surgery) | Admitting: Thoracic Surgery (Cardiothoracic Vascular Surgery)

## 2018-12-11 ENCOUNTER — Encounter: Payer: Self-pay | Admitting: Thoracic Surgery (Cardiothoracic Vascular Surgery)

## 2018-12-11 ENCOUNTER — Other Ambulatory Visit: Payer: Self-pay

## 2018-12-11 VITALS — BP 129/60 | HR 62 | Temp 97.7°F | Resp 16 | Ht 60.0 in | Wt 153.7 lb

## 2018-12-11 DIAGNOSIS — I080 Rheumatic disorders of both mitral and aortic valves: Secondary | ICD-10-CM

## 2018-12-11 DIAGNOSIS — I480 Paroxysmal atrial fibrillation: Secondary | ICD-10-CM | POA: Diagnosis not present

## 2018-12-11 DIAGNOSIS — I4811 Longstanding persistent atrial fibrillation: Secondary | ICD-10-CM

## 2018-12-11 DIAGNOSIS — Z20828 Contact with and (suspected) exposure to other viral communicable diseases: Secondary | ICD-10-CM | POA: Insufficient documentation

## 2018-12-11 DIAGNOSIS — I4510 Unspecified right bundle-branch block: Secondary | ICD-10-CM | POA: Insufficient documentation

## 2018-12-11 DIAGNOSIS — I4819 Other persistent atrial fibrillation: Secondary | ICD-10-CM | POA: Diagnosis not present

## 2018-12-11 DIAGNOSIS — J984 Other disorders of lung: Secondary | ICD-10-CM | POA: Insufficient documentation

## 2018-12-11 DIAGNOSIS — I4891 Unspecified atrial fibrillation: Secondary | ICD-10-CM | POA: Insufficient documentation

## 2018-12-11 DIAGNOSIS — I052 Rheumatic mitral stenosis with insufficiency: Secondary | ICD-10-CM

## 2018-12-11 DIAGNOSIS — I050 Rheumatic mitral stenosis: Secondary | ICD-10-CM

## 2018-12-11 DIAGNOSIS — Z01818 Encounter for other preprocedural examination: Secondary | ICD-10-CM | POA: Insufficient documentation

## 2018-12-11 DIAGNOSIS — R9431 Abnormal electrocardiogram [ECG] [EKG]: Secondary | ICD-10-CM | POA: Insufficient documentation

## 2018-12-11 DIAGNOSIS — I6523 Occlusion and stenosis of bilateral carotid arteries: Secondary | ICD-10-CM | POA: Insufficient documentation

## 2018-12-11 LAB — COMPREHENSIVE METABOLIC PANEL
ALT: 19 U/L (ref 0–44)
AST: 23 U/L (ref 15–41)
Albumin: 3.7 g/dL (ref 3.5–5.0)
Alkaline Phosphatase: 60 U/L (ref 38–126)
Anion gap: 12 (ref 5–15)
BUN: 7 mg/dL — ABNORMAL LOW (ref 8–23)
CO2: 25 mmol/L (ref 22–32)
Calcium: 8.9 mg/dL (ref 8.9–10.3)
Chloride: 100 mmol/L (ref 98–111)
Creatinine, Ser: 0.71 mg/dL (ref 0.44–1.00)
GFR calc Af Amer: >60 mL/min (ref >60)
GFR calc non Af Amer: >60 mL/min (ref >60)
Glucose, Bld: 107 mg/dL — ABNORMAL HIGH (ref 70–99)
Potassium: 3.6 mmol/L (ref 3.5–5.1)
Sodium: 137 mmol/L (ref 135–145)
Total Bilirubin: 0.8 mg/dL (ref 0.3–1.2)
Total Protein: 6.3 g/dL — ABNORMAL LOW (ref 6.5–8.1)

## 2018-12-11 LAB — URINALYSIS, ROUTINE W REFLEX MICROSCOPIC
Bacteria, UA: NONE SEEN
Bilirubin Urine: NEGATIVE
Glucose, UA: NEGATIVE mg/dL
Ketones, ur: NEGATIVE mg/dL
Leukocytes,Ua: NEGATIVE
Nitrite: NEGATIVE
Protein, ur: NEGATIVE mg/dL
Specific Gravity, Urine: 1.006 (ref 1.005–1.030)
pH: 7 (ref 5.0–8.0)

## 2018-12-11 LAB — BLOOD GAS, ARTERIAL
Acid-Base Excess: 5.5 mmol/L — ABNORMAL HIGH (ref 0.0–2.0)
Bicarbonate: 29.8 mmol/L — ABNORMAL HIGH (ref 20.0–28.0)
Drawn by: 42180
FIO2: 21
O2 Saturation: 98.3 %
Patient temperature: 37
pCO2 arterial: 45.9 mmHg (ref 32.0–48.0)
pH, Arterial: 7.428 (ref 7.350–7.450)
pO2, Arterial: 103 mmHg (ref 83.0–108.0)

## 2018-12-11 LAB — HEMOGLOBIN A1C
Hgb A1c MFr Bld: 5.3 % (ref 4.8–5.6)
Mean Plasma Glucose: 105.41 mg/dL

## 2018-12-11 LAB — APTT: aPTT: 50 seconds — ABNORMAL HIGH (ref 24–36)

## 2018-12-11 LAB — CBC
HCT: 40.3 % (ref 36.0–46.0)
Hemoglobin: 13.3 g/dL (ref 12.0–15.0)
MCH: 31.4 pg (ref 26.0–34.0)
MCHC: 33 g/dL (ref 30.0–36.0)
MCV: 95.3 fL (ref 80.0–100.0)
Platelets: 182 10*3/uL (ref 150–400)
RBC: 4.23 MIL/uL (ref 3.87–5.11)
RDW: 13.6 % (ref 11.5–15.5)
WBC: 6.2 10*3/uL (ref 4.0–10.5)
nRBC: 0 % (ref 0.0–0.2)

## 2018-12-11 LAB — SURGICAL PCR SCREEN
MRSA, PCR: NEGATIVE
Staphylococcus aureus: NEGATIVE

## 2018-12-11 LAB — PROTIME-INR
INR: 1.3 — ABNORMAL HIGH (ref 0.8–1.2)
Prothrombin Time: 16.2 seconds — ABNORMAL HIGH (ref 11.4–15.2)

## 2018-12-11 LAB — SARS CORONAVIRUS 2 (TAT 6-24 HRS): SARS Coronavirus 2: NEGATIVE

## 2018-12-11 NOTE — Progress Notes (Signed)
Pre op MVR testing       has been completed. Preliminary results can be found under CV proc through chart review. June Leap, BS, RDMS, RVT

## 2018-12-11 NOTE — Progress Notes (Signed)
PCP - Lawerance Cruel Cardiologist - [place]  Chest x-ray - 12/11/18 EKG - 12/11/18 Stress Test - denies ECHO - 10/17/18 Cardiac Cath - 10/17/18  Sleep Study - 2017 CPAP - at night instructed to bring the day of surgery  Blood Thinner Instructions:Coumadin LD on 11/12, on lovonox bridge started 11/14 LD 11/17 Aspirin Instructions:   COVID TEST- 11/16   Anesthesia review: yes, cardiac hx  Patient denies shortness of breath, fever, cough and chest pain at PAT appointment   All instructions explained to the patient, with a verbal understanding of the material. Patient agrees to go over the instructions while at home for a better understanding. Patient also instructed to self quarantine after being tested for COVID-19. The opportunity to ask questions was provided.

## 2018-12-11 NOTE — Progress Notes (Signed)
Spoke with Joanell Rising about patients blood work from [employer] today

## 2018-12-11 NOTE — Patient Instructions (Signed)
Do not take Coumadin  Continue taking all other medications without change through the day before surgery.  Have nothing to eat or drink after midnight the night before surgery.  On the morning of surgery take only Prilosec with a sip of water.

## 2018-12-11 NOTE — Progress Notes (Signed)
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  She was last seen here in the office on November 16, 2018.  Since that time she reports no new problems although she does admit that she has been quite nervous recently and she has developed some nausea for which she takes oral Zofran.  Appetite is been stable and in fact she has been eating a bit more than usual.  She just celebrated one of her birthdays.  She has not had any increase in shortness of breath.  She denies any fevers, chills, or productive cough.  Although she is anxious about her surgery she wants to go ahead and get it over with.  She stopped taking Coumadin last week in preparation for surgery and has been taking Lovenox injections.   Current Outpatient Medications  Medication Sig Dispense Refill   acetaminophen (TYLENOL) 500 MG tablet Take 1,000 mg by mouth every 6 (six) hours as needed for moderate pain or headache.      ALPRAZolam (XANAX) 0.25 MG tablet Take 0.125 mg by mouth 2 (two) times daily as needed for anxiety.      carvedilol (COREG) 3.125 MG tablet Take 1 tablet (3.125 mg total) by mouth 2 (two) times daily with a meal. 180 tablet 3   cetirizine (ZYRTEC) 10 MG tablet Take 10 mg by mouth at bedtime.      Cholecalciferol (VITAMIN D) 2000 UNITS CAPS Take 2,000 Units by mouth daily.       conjugated estrogens (PREMARIN) vaginal cream Place 1 Applicatorful vaginally daily as needed (irritation).      dorzolamide-timolol  (COSOPT) 22.3-6.8 MG/ML ophthalmic solution Place 1 drop into both eyes daily.     doxepin (SINEQUAN) 10 MG capsule Take 10-20 mg by mouth at bedtime as needed (sleep).      enoxaparin (LOVENOX) 60 MG/0.6ML injection Inject 0.6 mLs (60 mg total) into the skin every 12 (twelve) hours. 4.2 mL 0   famotidine (PEPCID) 20 MG tablet Take 20 mg by mouth at bedtime.     flecainide (TAMBOCOR) 50 MG tablet TAKE 2 TABLETS BY MOUTH TWO TIMES DAILY (Patient taking differently: Take 100 mg by mouth 2 (two) times daily. ) 360 tablet 3   gabapentin (NEURONTIN) 300 MG capsule Take 300 mg by mouth 3 (three) times daily.      hydrochlorothiazide (MICROZIDE) 12.5 MG capsule TAKE 1 CAPSULE BY MOUTH  DAILY (Patient taking differently: Take 12.5 mg by mouth daily. ) 90 capsule 3   lactobacillus acidophilus (BACID) TABS tablet Take 1 tablet by mouth daily.      latanoprost (XALATAN) 0.005 % ophthalmic solution Place 1 drop into both eyes at bedtime.   6   losartan (COZAAR) 50 MG tablet Take 50 mg by mouth daily.     Multiple Vitamin (MULTIVITAMIN WITH MINERALS) TABS tablet Take 1 tablet by mouth daily.     NON FORMULARY Apply 1 application topically 2 (two) times daily as needed (for  eczema). Traimcinolone/[facility] Moist Cream     omeprazole (PRILOSEC) 40 MG capsule Take 40 mg by mouth daily.      ondansetron (ZOFRAN) 4 MG tablet Take 4-8 mg by mouth every 8 (eight) hours as needed for nausea or vomiting.     oxybutynin (DITROPAN-XL) 10 MG 24 hr tablet Take 10 mg by mouth daily.      polyethylene glycol (MIRALAX / GLYCOLAX) 17 g packet Take 17 g by mouth daily as needed for moderate constipation.      PRESCRIPTION MEDICATION Inhale into the lungs at bedtime. CPAP     warfarin (COUMADIN) 5 MG tablet Take 1 tablet by mouth daily or as directed by coumadin clinic (Patient not taking: Reported on 12/11/2018) 90 tablet 0   No current facility-administered medications for this visit.       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  The patient describes stable symptoms of exertional shortness of breath and fatigue consistent with chronic diastolic congestive heart failure, New York Heart Association functional class III.  She was hospitalized in September following a brief syncopal event that may have been related to increased dose of beta-blocker following an episode of paroxysmal atrial fibrillation.  I have personally reviewed the patient's transthoracic and transesophageal echocardiograms which demonstrate the presence of rheumatic appearance of mitral valve with severe mitral stenosis and mild mitral regurgitation. Mean transvalvular gradient across mitral valve was estimated 8 mmHg but the mitral valve area was notably estimated only 1.2 cm with pressure half-time reported 140 ms on transthoracic echocardiogram and 186 ms on transesophageal echocardiogram. There is at least mild central aortic insufficiency. There is an eccentric area of severe calcification in the mitral valve which would preclude primary balloon valvuloplasty as an option for treatment. Left ventricular size and systolic function remains normal. There is severe left atrial enlargement. Right ventricular size  and function is normal. There is mild central tricuspid regurgitation. Catheterization is notable for the absence of significant coronary artery disease. There is mild pulmonary hypertension.  CT angiogram of the chest performed in 2017 reveals no signs of significant atherosclerotic disease involving the thoracic aorta and CT scan of the abdomen and pelvis with intravenous contrast performed more recently in 2019 reveals only mild atherosclerotic disease involving the abdominal aorta and iliac vessels.  At present the patient is maintaining sinus rhythm although she has had at least 2 episodes of recurrent paroxysmal atrial fibrillation in the recent past.  I agree the patient would benefit from mitral valve replacement and she wouldlikelyadditionally benefit from a concomitant Maze procedure. She appears tobe an acceptable candidate for minimally invasive approach for surgery.  Plan:  The patientand her husbandwere again counseled at length regarding the indications, risks and potential benefits of mitral valve replacement. The rationale for elective surgery has been explained, including a comparison between surgery and continued medical therapy with close follow-up. The relative risks and benefits of performing a maze procedure at the time ofher surgery was discussed at length, including the expected likelihood of long term freedom from recurrent symptomatic atrial fibrillation and/or atrial flutter.Alternative surgical approaches have been discussed including a comparison between conventional sternotomy and minimally-invasive techniques. The relative risks and benefits of each have been reviewed as they pertain to the patient's specific circumstances, and expectations for the patient's postoperative convalescence has been discussed. We discussed the possibility of replacing the mitral valve using a mechanical prosthesis with the attendant need for long-term anticoagulation versus  the alternative of replacing it using a bioprosthetic tissue valve with its potential for late structural valve deterioration and failure, depending upon the patient's longevity.  The patient specifically requests that her mitral valve must be replaced using a bioprosthetic tissue valve.   The relative risks and benefits of performing a maze procedure at the time of her surgery was discussed at length, including the expected likelihood of long term freedom from recurrent symptomatic atrial fibrillation and/or atrial flutter.    The patient understands and accepts all potential risks of surgery including but not limited to risk of death, stroke or other neurologic complication, myocardial infarction, congestive heart failure, respiratory failure, renal failure, bleeding requiring transfusion and/or reexploration, arrhythmia, infection or other wound complications, pneumonia, pleural and/or pericardial effusion, pulmonary embolus, aortic dissection or other major vascular complication, or delayed complications related to valve repair or replacement including but not limited to structural valve deterioration and failure, thrombosis, embolization, endocarditis, or paravalvular leak.  Specific risks potentially related to the minimally-invasive approach were discussed at length, including but not limited to risk of conversion to full or partial sternotomy, aortic dissection or other major vascular complication, unilateral acute lung injury or pulmonary edema, phrenic nerve dysfunction or paralysis, rib fracture, chronic pain, lung hernia, or lymphocele.  All of their questions have been answered.    I spent in excess of 15 minutes during the conduct of this office consultation and >50% of this time involved direct face-to-face encounter with the patient for counseling and/or coordination of their care.    [name]. Roxy Manns, MD 12/11/2018 12:53 PM

## 2018-12-12 LAB — ABO/RH: ABO/RH(D): A NEG

## 2018-12-12 MED ORDER — NITROGLYCERIN IN D5W 200-5 MCG/ML-% IV SOLN
2.0000 ug/min | INTRAVENOUS | Status: DC
  Filled 2018-12-12: qty 250

## 2018-12-12 MED ORDER — NOREPINEPHRINE 4 MG/250ML-% IV SOLN
0.0000 ug/min | INTRAVENOUS | Status: DC
  Filled 2018-12-12: qty 250

## 2018-12-12 MED ORDER — TRANEXAMIC ACID (OHS) PUMP PRIME SOLUTION
2.0000 mg/kg | INTRAVENOUS | Status: DC
  Filled 2018-12-12: qty 1.39

## 2018-12-12 MED ORDER — TRANEXAMIC ACID (OHS) BOLUS VIA INFUSION
15.0000 mg/kg | INTRAVENOUS | Status: AC
  Administered 2018-12-13: 1045.5 mg via INTRAVENOUS
  Filled 2018-12-12: qty 1046

## 2018-12-12 MED ORDER — SODIUM CHLORIDE 0.9 % IV SOLN
750.0000 mg | INTRAVENOUS | Status: DC
  Filled 2018-12-12: qty 750

## 2018-12-12 MED ORDER — DEXMEDETOMIDINE HCL IN NACL 400 MCG/100ML IV SOLN
0.1000 ug/kg/h | INTRAVENOUS | Status: AC
  Administered 2018-12-13: .5 ug/kg/h via INTRAVENOUS
  Filled 2018-12-12: qty 100

## 2018-12-12 MED ORDER — POTASSIUM CHLORIDE 2 MEQ/ML IV SOLN
80.0000 meq | INTRAVENOUS | Status: DC
  Filled 2018-12-12: qty 40

## 2018-12-12 MED ORDER — VANCOMYCIN HCL 1000 MG IV SOLR
INTRAVENOUS | Status: AC
  Administered 2018-12-13: 1000 mL
  Filled 2018-12-12: qty 1000

## 2018-12-12 MED ORDER — MANNITOL 20 % IV SOLN
Freq: Once | INTRAVENOUS | Status: DC
  Filled 2018-12-12: qty 13

## 2018-12-12 MED ORDER — PLASMA-LYTE 148 IV SOLN
INTRAVENOUS | Status: AC
  Administered 2018-12-13: 11:00:00 500 mL
  Filled 2018-12-12: qty 2.5

## 2018-12-12 MED ORDER — DOPAMINE-DEXTROSE 3.2-5 MG/ML-% IV SOLN
0.0000 ug/kg/min | INTRAVENOUS | Status: DC
  Filled 2018-12-12: qty 250

## 2018-12-12 MED ORDER — EPINEPHRINE HCL 5 MG/250ML IV SOLN IN NS
0.0000 ug/min | INTRAVENOUS | Status: DC
  Filled 2018-12-12: qty 250

## 2018-12-12 MED ORDER — SODIUM CHLORIDE 0.9 % IV SOLN
1.5000 g | INTRAVENOUS | Status: AC
  Administered 2018-12-13: 750 g via INTRAVENOUS
  Administered 2018-12-13: 1.5 g via INTRAVENOUS
  Filled 2018-12-12 (×2): qty 1.5

## 2018-12-12 MED ORDER — VANCOMYCIN HCL 10 G IV SOLR
1250.0000 mg | INTRAVENOUS | Status: AC
  Administered 2018-12-13: 1250 mg via INTRAVENOUS
  Filled 2018-12-12: qty 1250

## 2018-12-12 MED ORDER — TRANEXAMIC ACID 1000 MG/10ML IV SOLN
1.5000 mg/kg/h | INTRAVENOUS | Status: AC
  Administered 2018-12-13: 1.5 mg/kg/h via INTRAVENOUS
  Filled 2018-12-12: qty 25

## 2018-12-12 MED ORDER — INSULIN REGULAR(HUMAN) IN NACL 100-0.9 UT/100ML-% IV SOLN
INTRAVENOUS | Status: AC
  Administered 2018-12-13: .6 [IU]/h via INTRAVENOUS
  Filled 2018-12-12: qty 100

## 2018-12-12 MED ORDER — PHENYLEPHRINE HCL-NACL 20-0.9 MG/250ML-% IV SOLN
30.0000 ug/min | INTRAVENOUS | Status: AC
  Administered 2018-12-13: 30 ug/min via INTRAVENOUS
  Filled 2018-12-12: qty 250

## 2018-12-12 MED ORDER — MILRINONE LACTATE IN DEXTROSE 20-5 MG/100ML-% IV SOLN
0.3000 ug/kg/min | INTRAVENOUS | Status: DC
  Filled 2018-12-12: qty 100

## 2018-12-12 MED ORDER — SODIUM CHLORIDE 0.9 % IV SOLN
INTRAVENOUS | Status: DC
  Filled 2018-12-12: qty 30

## 2018-12-12 NOTE — H&P (Signed)
° °   °[address] °      [place] [address] °            [phone]   °      ° °CARDIOTHORACIC SURGERY HISTORY AND PHYSICAL EXAM ° °Primary Cardiologist is Tiffany [place], MD °PCP is Ross, Charles Alan, MD ° °  °    °Chief Complaint  °Patient presents with  °• Mitral Stenosis  °    Surgical eval, Consulted in hospital on 10/18/18  °• Atrial Fibrillation  ° ° ° °HPI: °  °Patient is a 72-year-old female with likely rheumatic heart disease including mitral stenosis, aortic insufficiency, and recurrent paroxysmal atrial fibrillation and atrial flutter who describes a long history of symptoms consistent with chronic diastolic congestive heart failure and was hospitalized following a brief syncopal event who returns to the office today for follow up consultation visit regarding the management of severe symptomatic mitral stenosis and recurrent paroxysmal atrial fibrillation.  I recently had the opportunity to evaluation the patient in consultation on October 18, 2018 while she was hospitalized following a brief syncopal event.  No definite cause of syncope was identified although it was felt highly likely related to the patient's medications, specifically her beta-blocker.  According to the patient she had developed an episode of paroxysmal atrial fibrillation the day before and was instructed to take an extra dose of carvedilol.  She took another extra dose of carvedilol the following morning when she felt like she was still in atrial fibrillation, and it was subsequent to that event that she passed out.  During her hospitalization her dose of carvedilol was reduced but additional diagnostic testing prompted surgical consultation for management of severe mitral stenosis.  At the time of my initial consultation the patient was not interested in considering surgical intervention during that hospitalization and wanted to go home.  She was converted from Eliquis to warfarin for long-term  anticoagulation and has been seen in follow-up on 1 occasion at [facility].  She called our office to request a follow-up consultation visit and returns to our office today with her husband.  She states that since hospital discharge she has remained stable.  She has not had any further syncopal episodes although she does report occasional slight dizziness.  She describes stable symptoms of exertional shortness of breath.  She states that she gets short of breath with low-level activity and occasionally gets short of breath at night.  Her weight has been stable and she has not had increased lower extremity edema.  She has never had any chest pain or chest tightness.  She thinks that she has been maintaining sinus rhythm since hospital discharge.  She is now wearing a Zio patch. ° °Patient is a 73-year-old female with likely rheumatic heart disease including mitral stenosis, aortic insufficiency, and recurrent paroxysmal atrial fibrillation and atrial flutter who returns to the office today with tentative plans to proceed with minimally invasive mitral valve replacement and Maze procedure on December 13, 2018.  She was last seen here in the office on November 16, 2018.  Since that time she reports no new problems although she does admit that she has been quite nervous recently and she has developed some nausea for which she takes oral Zofran.  Appetite is been stable and in fact she has been eating a bit more than usual.  She just celebrated one of her birthdays.  She has not had any increase in shortness of breath.  She denies any fevers, chills,   or productive cough.  Although she is anxious about her surgery she wants to go ahead and get it over with.  She stopped taking Coumadin last week in preparation for surgery and has been taking Lovenox injections. ° °Past Medical History:  °Diagnosis Date  °• Anxiety 06/29/2012  °• Aortic regurgitation 08/13/2015  °• Arthritis   ° "hands, wrists, back, feet, toes"  (06/24/2017)  °• Atrial flutter (HCC) 09/18/2015  °• Atypical chest pain 05/13/2016  °• Chest pain   ° remote cath in 1996 with NORMAL coronaries noted  °• CHF (congestive heart failure) (HCC)   °• Dyspnea 10/30/2010  °• Essential hypertension 09/05/2013  °• Exogenous obesity   ° history of   °• GERD (gastroesophageal reflux disease)   °• Glaucoma, both eyes   °• Headache, migraine   ° "stopped in my 50's" (09/18/2015)  °• History of cardiovascular stress test 2007  ° showing no ischemia  °• Hypertension   °• Mitral stenosis and aortic insufficiency 06/23/2010  °• Mitral stenosis with insufficiency   °• Mitral stenosis with regurgitation   °• Murmur   ° of mitral stenosis and moderate aortic insufficiency      °• Nausea 05/01/2018  °• OSA on CPAP   °• Palpitations   ° occasional  °• Paroxysmal A-fib (HCC) 06/24/2017  °• Pericardial effusion 09/18/2015  °• Persistent atrial fibrillation (HCC) 03/10/2017  °• Personal history of rheumatic heart disease   °• SBE (subacute bacterial endocarditis)   ° prophylaxis, patient unaware  °• Sliding hiatal hernia   °• Vertigo 05/01/2018  ° ° °Past Surgical History:  °Procedure Laterality Date  °• ABDOMINAL HYSTERECTOMY  1975  °• APPENDECTOMY  1977  °• ATRIAL FIBRILLATION ABLATION N/A 03/10/2017  ° Procedure: ATRIAL FIBRILLATION ABLATION;  Surgeon: Camnitz, Will Martin, MD;  Location: [facility];  Service: Cardiovascular;  Laterality: N/A;  °• ATRIAL FIBRILLATION ABLATION  06/24/2017  °• ATRIAL FIBRILLATION ABLATION N/A 06/24/2017  ° Procedure: ATRIAL FIBRILLATION ABLATION;  Surgeon: Camnitz, Will Martin, MD;  Location: [facility];  Service: Cardiovascular;  Laterality: N/A;  °• BUNIONECTOMY Right 2000s  °• CARDIAC CATHETERIZATION  01/13/1995  ° normal coronary anatomy and mild mitral stenosis and mild pulmonary hypertension  °• CARDIOVERSION N/A 09/22/2015  ° Procedure: CARDIOVERSION;  Surgeon: Mark C Skains, MD;  Location: [facility];  Service: Cardiovascular;  Laterality: N/A;   °• CARDIOVERSION N/A 10/25/2016  ° Procedure: CARDIOVERSION;  Surgeon: Ross, Paula V, MD;  Location: [facility];  Service: Cardiovascular;  Laterality: N/A;  °• CARDIOVERSION N/A 04/15/2017  ° Procedure: CARDIOVERSION;  Surgeon: Nishan, Peter C, MD;  Location: [facility];  Service: Cardiovascular;  Laterality: N/A;  °• CARDIOVERSION N/A 05/16/2017  ° Procedure: CARDIOVERSION;  Surgeon: Hilty, Kenneth C, MD;  Location: [facility];  Service: Cardiovascular;  Laterality: N/A;  °• CATARACT EXTRACTION W/ INTRAOCULAR LENS  IMPLANT, BILATERAL Bilateral 2013  °• COLONOSCOPY  2013  °• EYE SURGERY Bilateral   ° cataracts  °• LAPAROSCOPIC CHOLECYSTECTOMY  1997  °• RIGHT/LEFT HEART CATH AND CORONARY ANGIOGRAPHY N/A 10/18/2018  ° Procedure: RIGHT/LEFT HEART CATH AND CORONARY ANGIOGRAPHY;  Surgeon: Harding, David W, MD;  [facility];  Service: Cardiovascular;  Laterality: N/A;  °• TEE WITHOUT CARDIOVERSION N/A 06/23/2017  ° Procedure: TRANSESOPHAGEAL ECHOCARDIOGRAM (TEE);  Surgeon: Ross, Paula V, MD;  [facility];  Service: Cardiovascular;  Laterality: N/A;  °• TEE WITHOUT CARDIOVERSION N/A 10/17/2018  ° Procedure: TRANSESOPHAGEAL ECHOCARDIOGRAM (TEE);  Surgeon: Skains, Mark C, MD;    MD;  Location: [facility];  Service: Cardiovascular;  Laterality: N/A;   TONSILLECTOMY AND ADENOIDECTOMY  1990s   UVULOPALATOPHARYNGOPLASTY, TONSILLECTOMY AND SEPTOPLASTY  1990s    Family History  Problem Relation Age of Onset   Heart attack Mother    Hypertension Brother    Kidney disease Brother    Diabetes Brother    Heart failure Maternal Grandfather     Social History Social History   Tobacco Use   Smoking status: Never Smoker   Smokeless tobacco: Never Used  Substance Use Topics   Alcohol use: Yes    Comment: rarely   Drug use: No    Prior to Admission medications   Medication Sig Start Date End Date Taking? Authorizing Provider  acetaminophen (TYLENOL) 500 MG tablet Take 1,000 mg  by mouth every 6 (six) hours as needed for moderate pain or headache.    Yes [provider]  ALPRAZolam (XANAX) 0.25 MG tablet Take 0.125 mg by mouth 2 (two) times daily as needed for anxiety.    Yes [provider]  carvedilol (COREG) 3.125 MG tablet Take 1 tablet (3.125 mg total) by mouth 2 (two) times daily with a meal. 11/02/18  Yes Kroeger, Daleen Snook M., PA-C  cetirizine (ZYRTEC) 10 MG tablet Take 10 mg by mouth at bedtime.    Yes [provider]  Cholecalciferol (VITAMIN D) 2000 UNITS CAPS Take 2,000 Units by mouth daily.     Yes [provider]  conjugated estrogens (PREMARIN) vaginal cream Place 1 Applicatorful vaginally daily as needed (irritation).    Yes [provider]  dorzolamide-timolol (COSOPT) 22.3-6.8 MG/ML ophthalmic solution Place 1 drop into both eyes daily.   Yes [provider]  doxepin (SINEQUAN) 10 MG capsule Take 10-20 mg by mouth at bedtime as needed (sleep).  06/10/17  Yes [provider]  famotidine (PEPCID) 20 MG tablet Take 20 mg by mouth at bedtime.   Yes [provider]  flecainide (TAMBOCOR) 50 MG tablet TAKE 2 TABLETS BY MOUTH TWO TIMES DAILY Patient taking differently: Take 100 mg by mouth 2 (two) times daily.  02/21/18  Yes Baldwin [place], PA-C  gabapentin (NEURONTIN) 300 MG capsule Take 300 mg by mouth 3 (three) times daily.    Yes [provider]  hydrochlorothiazide (MICROZIDE) 12.5 MG capsule TAKE 1 CAPSULE BY MOUTH  DAILY Patient taking differently: Take 12.5 mg by mouth daily.  03/21/18  Yes Skeet Latch, MD  lactobacillus acidophilus (BACID) TABS tablet Take 1 tablet by mouth daily.    Yes [provider]  latanoprost (XALATAN) 0.005 % ophthalmic solution Place 1 drop into both eyes at bedtime.  06/17/14  Yes [provider]  losartan (COZAAR) 50 MG tablet Take 50 mg by mouth daily.   Yes [provider]  Multiple Vitamin (MULTIVITAMIN WITH  MINERALS) TABS tablet Take 1 tablet by mouth daily.   Yes [provider]  NON FORMULARY Apply 1 application topically 2 (two) times daily as needed (for eczema). Traimcinolone/[facility] Moist Cream   Yes [provider]  omeprazole (PRILOSEC) 40 MG capsule Take 40 mg by mouth daily.    Yes [provider]  oxybutynin (DITROPAN-XL) 10 MG 24 hr tablet Take 10 mg by mouth daily.  12/16/15  Yes [provider]  polyethylene glycol (MIRALAX / GLYCOLAX) 17 g packet Take 17 g by mouth daily as needed for moderate constipation.    Yes [provider]  warfarin (COUMADIN) 5 MG tablet Take 1 tablet by  or as directed by coumadin clinic °Patient not taking: Reported on 12/11/2018 12/01/18  Yes Premont, Tiffany, MD  °enoxaparin (LOVENOX) 60 MG/0.6ML injection Inject 0.6 mLs (60 mg total) into the skin every 12 (twelve) hours. 12/01/18   [place], Tiffany, MD  °ondansetron (ZOFRAN) 4 MG tablet Take 4-8 mg by mouth every 8 (eight) hours as needed for nausea or vomiting.    [provider]  °PRESCRIPTION MEDICATION Inhale into the lungs at bedtime. CPAP    [provider]  ° ° °Allergies  °Allergen Reactions  °• Amoxicillin Diarrhea  °  Has patient had a PCN reaction causing immediate rash, facial/tongue/throat swelling, SOB or lightheadedness with hypotension: no °Has patient had a PCN reaction causing severe rash involving mucus membranes or skin necrosis: no °Has patient had a PCN reaction that required hospitalization: no °Has patient had a PCN reaction occurring within the last 10 years: no °If all of the above answers are "NO", then may proceed with Cephalosporin use. °  °• Metoprolol Tartrate Other (See Comments)  °  Mouth sores °"mouth tastes like mold"  °• Oxaprozin Nausea Only  ° ° °  °Review of Systems: °  °            General:                      normaol appetite, decreased energy, no weight gain, no weight loss, no fever °            Cardiac:                        no chest pain with exertion, no chest pain at rest, +SOB with exertion, no resting SOB, no PND, + orthopnea, + palpitations, + arrhythmia, + atrial fibrillation, no LE edema, + dizzy spells, + syncope °            Respiratory:                 + chronic shortness of breath, no home oxygen, no productive cough, + chronic dry cough, no bronchitis, no wheezing, no hemoptysis, no asthma, no pain with inspiration or cough, + sleep apnea, + CPAP at night °            GI:                               no difficulty swallowing, + reflux, no frequent heartburn, no hiatal hernia, no abdominal pain, + chronic constipation, no diarrhea, no hematochezia, no hematemesis, no melena °            GU:                              no dysuria,  + frequency, no urinary tract infection, no hematuria, no kidney stones, no kidney disease °            Vascular:                     no pain suggestive of claudication, no pain in feet, no leg cramps, no varicose veins, no DVT, no non-healing foot ulcer °            Neuro:                           no stroke, no TIA's, no seizures, no headaches, no temporary blindness one eye,  no slurred speech, no peripheral neuropathy, no chronic pain, no instability of gait, no memory/cognitive dysfunction °            Musculoskeletal:         + arthritis, no joint swelling, no myalgias, no difficulty walking, normal mobility  °            Skin:                            + rash, + itching, no skin infections, no pressure sores or ulcerations °            Psych:                         + anxiety, no depression, no nervousness, no unusual recent stress °            Eyes:                           no blurry vision, + floaters, no recent vision changes, + wears glasses or contacts °            ENT:                            + hearing loss, no loose or painful teth, + partial dentures, last saw dentist January 2020 °            Hematologic:               + easy bruising, no abnormal bleeding,  no clotting disorder, no frequent epistaxis °            Endocrine:                   no diabetes, does not check CBG's at homee °  °                         °Physical Exam: °  °            BP 140/67    Pulse 74    Temp 97.7 °F (36.5 °C) (Skin)    Resp 20    Ht 5' (1.524 m)    Wt 151 lb (68.5 kg)    SpO2 95% Comment: RA   BMI 29.49 kg/m²  °            General:                      Obese female NAD  °            HEENT:                       Unremarkable  °            Neck:                           no JVD, no bruits, no adenopathy  °            Chest:                            clear to auscultation, symmetrical breath sounds, no wheezes, no rhonchi  °            CV:                              RRR, no  murmur  °            Abdomen:                    soft, non-tender, no masses  °            Extremities:                 warm, well-perfused, pulses diminished, no LE edema °            Rectal/GU                   Deferred °            Neuro:                         Grossly non-focal and symmetrical throughout °            Skin:                            Clean and dry, no rashes, no breakdown ° ° °Diagnostic Tests: °  °EKG (11/02/2018)         NSR w/ 1st degree AV block °  °  °   ° ECHOCARDIOGRAM REPORT    °  °Patient Name: Linden S Peer Date of Exam: 10/16/2018  °Medical Rec #: [id number] Height: 60.0 in  °Accession #: [id number] Weight: 155.6 lb  °Date of Birth: 06/29/1945 BSA: 1.68 m²  °Patient Age: 72 years BP: 155/69 mmHg  °Patient Gender: F HR: 65 bpm.  °Exam Location: Inpatient  °Procedure: 2D Echo  °Indications: syncope 780.2  °History: Patient has prior history of Echocardiogram examinations, most  °recent 06/23/2017. Arrythmias:Atrial Fibrillation Risk  °Factors:Hypertension.  °Sonographer: Lauren Pennington  °Referring Phys: [id number] MICHAEL E NORINS  °IMPRESSIONS  °1. Left ventricular ejection fraction, by visual estimation, is 60 to 65%. The left ventricle has normal function. Normal left ventricular size. There is  no left ventricular hypertrophy.  °2. Global right ventricle has normal systolic function.The right ventricular size is normal. No increase in right ventricular wall thickness.  °3. Left atrial size was severely dilated.  °4. Right atrial size was normal.  °5. Small pericardial effusion.  °6. Mild calcification of the posterior mitral valve leaflet(s).  °7. Moderate thickening of the posterior mitral valve leaflet(s).  °8. Moderately decreased mobility of the mitral valve leaflets.  °9. The mitral valve is rheumatic. Mild mitral valve regurgitation. Moderate-severe mitral stenosis.  °10. Mean transmitral gradient is 7mmHg.  °Viewed personally TEE from 2019 and value leaflets appear thin and non calcified. Harmonics on current echocardiogram may be causing artifactual thickening.  °11. The tricuspid valve is normal in structure. Tricuspid valve regurgitation was not visualized by color flow Doppler.  °12. The aortic valve is normal in structure. Aortic valve regurgitation is mild by color flow Doppler. Mild aortic valve sclerosis without stenosis.  °13. The pulmonic valve was normal in structure. Pulmonic valve regurgitation is trivial by color flow Doppler.  °14. Mildly elevated pulmonary artery systolic pressure.  °15. The inferior vena cava is normal in size with greater than 50% respiratory variability,   suggesting right atrial pressure of 3 mmHg.  °16. Rheumatic mitral valve stenosis. Transmitral gradient has increased. Consider repeat TEE and stress echo to evaluate transmitral gradient during exercise. May need a candidate for balloon valvuloplasty.  °FINDINGS  °Left Ventricle: Left ventricular ejection fraction, by visual estimation, is 60 to 65%. The left ventricle has normal function. There is no left ventricular hypertrophy. Normal left ventricular size.  °Right Ventricle: The right ventricular size is normal. No increase in right ventricular wall thickness. Global RV systolic function is has normal  systolic function. The tricuspid regurgitant velocity is 2.66 m/s, and with an assumed right atrial pressure  °of 3 mmHg, the estimated right ventricular systolic pressure is mildly elevated at 31.3 mmHg.  °Left Atrium: Left atrial size was severely dilated.  °Right Atrium: Right atrial size was normal in size  °Pericardium: A small pericardial effusion is present.  °Mitral Valve: The mitral valve is rheumatic. There is moderate thickening of the posterior mitral valve leaflet(s). There is mild calcification of the posterior mitral valve leaflet(s). Moderately decreased mobility of the mitral valve leaflets.  °Moderate-severe mitral valve stenosis by observation. MV peak gradient, 21.3 mmHg. Mild mitral valve regurgitation. Mean transmitral gradient is 7mmHg.  °Viewed personally TEE from 2019 and value leaflets appear thin and non calcified. Harmonics on current echocardiogram may be causing artifactual thickening.  °Tricuspid Valve: The tricuspid valve is normal in structure. Tricuspid valve regurgitation was not visualized by color flow Doppler.  °Aortic Valve: The aortic valve is normal in structure. Aortic valve regurgitation is mild by color flow Doppler. Aortic regurgitation PHT measures 370 msec. Mild aortic valve sclerosis is present, with no evidence of aortic valve stenosis.  °Pulmonic Valve: The pulmonic valve was normal in structure. Pulmonic valve regurgitation is trivial by color flow Doppler.  °Aorta: The aortic root, ascending aorta and aortic arch are all structurally normal, with no evidence of dilitation or obstruction.  °Venous: The inferior vena cava is normal in size with greater than 50% respiratory variability, suggesting right atrial pressure of 3 mmHg.  °IAS/Shunts: No atrial level shunt detected by color flow Doppler. No ventricular septal defect is seen or detected. There is no evidence of an atrial septal defect.  °Additional Comments: Rheumatic mitral valve stenosis. Transmitral  gradient has increased. Consider repeat TEE and stress echo to evaluate transmitral gradient during exercise. May need a candidate for balloon valvuloplasty.  °LEFT VENTRICLE Normals  °PLAX 2D  °LVIDd: 4.50 cm 3.6 cm Diastology Normals  °LVIDs: 2.80 cm 1.7 cm LV e' lateral: 7.07 cm/s 6.42 cm/s  °LV PW: 1.20 cm 1.4 cm LV E/e' lateral: 29.8 15.4  °LV IVS: 1.00 cm 1.3 cm LV e' medial: 4.79 cm/s 6.96 cm/s  °LVOT diam: 1.90 cm 2.0 cm LV E/e' medial: 44.1 6.96  °LV SV: 63 ml 79 ml  °LV SV Index: 35.81 45 ml/m2  °LVOT Area: 2.84 cm² 3.14 cm2  °RIGHT VENTRICLE  °RV S prime: 8.92 cm/s  °TAPSE (M-mode): 1.8 cm  °LEFT ATRIUM Index RIGHT ATRIUM Index  °LA diam: 5.30 cm 3.16 cm/m² RA Area: 15.20 cm²  °LA Vol (A2C): 92.1 ml 54.89 ml/m² RA Volume: 32.00 ml 19.07 ml/m²  °LA Vol (A4C): 74.0 ml 44.10 ml/m²  °LA Biplane Vol: 84.4 ml 50.30 ml/m²  °AORTIC VALVE Normals  °LVOT Vmax: 79.60 cm/s  °LVOT Vmean: 56.100 cm/s 75 cm/s  °LVOT VTI: 0.194 m 25.3 cm  °AI PHT: 370 msec  °AORTA Normals  °Ao Root diam: 2.80 cm 31 mm  °  MITRAL VALVE Normals TRICUSPID VALVE Normals  °MV Area (PHT): 1.57 cm² TR Peak grad: 28.3 mmHg  °MV Peak grad: 21.3 mmHg 4 mmHg TR Vmax: 266.00 cm/s 288 cm/s  °MV Mean grad: 7.0 mmHg  °MV Vmax: 2.31 m/s SHUNTS  °MV Vmean: 126.0 cm/s Systemic VTI: 0.19 m  °MV VTI: 0.67 m Systemic Diam: 1.90 cm  °MV PHT: 140.07 msec 55 ms  °MV Decel Time: 483 msec 187 ms  °MV E velocity: 211.00 cm/s 103 cm/s  °MV A velocity: 100.00 cm/s 70.3 cm/s  °MV E/A ratio: 2.11 1.5  °Mark Skains MD  °Electronically signed by Mark Skains MD  °Signature Date/Time: 10/16/2018/1:30:25 PM  ° ° ° ° °TRANSESOPHOGEAL ECHO REPORT  °Patient Name: Lasharn S Villwock Date of Exam: 10/17/2018  °Medical Rec #: [id number] Height: 60.0 in  °Accession #: [id number] Weight: 152.7 lb  °Date of Birth: 04/05/1945 BSA: 1.66 m²  °Patient Age: 72 years BP: 182/72 mmHg  °Patient Gender: F HR: 69 bpm.  °Exam Location: Inpatient  °Procedure: Transesophageal Echo, Color Doppler, Cardiac  Doppler and 3D Echo  °Indications: I05.0 Rheumatic mitral stenosis  °History: Patient has prior history of Echocardiogram examinations, most  °recent 10/16/2018. CHF; Arrythmias:Atrial Fibrillation Risk  °Factors:Hypertension and Sleep Apnea. Rheumatic Mitral  °Stenosis.  °Sonographer: Emily Senior RDCS  °Referring Phys: [id number] TIFFANY [place]  °Diagnosing Phys: Mark Skains MD  °PROCEDURE: Consent was requested emergently by emergency room physicain. Patients was under conscious sedation during this procedure. Anesthetic was administered intravenously by performing Physician: 75mcg of Fentanyl, 5.0mg of Versed. The  °transesophogeal probe was passed through the esophogus of the patient. Image quality was excellent. The patient's vital signs; including heart rate, blood pressure, and oxygen saturation; remained stable throughout the procedure. The patient developed no  °complications during the procedure.  °IMPRESSIONS  °1. Left ventricular ejection fraction, by visual estimation, is 55 to 60%. The left ventricle has normal function. Normal left ventricular size. There is no left ventricular hypertrophy.  °2. Global right ventricle has normal systolic function.The right ventricular size is normal. No increase in right ventricular wall thickness.  °3. Pulmonary hypertension.  °4. Left atrial size was severely dilated.  °5. No LAA thrombus.  °6. Right atrial size was normal.  °7. The mitral valve is rheumatic. Mild mitral valve regurgitation. Moderate-severe mitral stenosis. moderate mitral valve stenosis.  °8. Moderate stenosis by pressure half time 1.2cm squared and mean gradient (8mmHg). PISA radius 1.2cm, with calculated area of 1.2 cm squared.  °Valve leaflets are thin, pliable, no subvalvular thickening, however surrounding P1 (posterior leaflet) there is a calcified fixed nodule (MAC like appearance).  °9. The tricuspid valve is normal in structure. Tricuspid valve regurgitation is mild.  °10. The aortic valve  is tricuspid Aortic valve regurgitation is mild by color flow Doppler. Mild aortic valve sclerosis without stenosis.  °11. The pulmonic valve was normal in structure. Pulmonic valve regurgitation is not visualized by color flow Doppler.  °12. Moderately elevated pulmonary artery systolic pressure.  °13. The inferior vena cava is normal in size with greater than 50% respiratory variability, suggesting right atrial pressure of 3 mmHg.  °14. Consider stress echo with transmitral gradient measurements.  °15. Her moderate to severely elevated pulmonary pressures may indicate along with AFIB and markedly dilated LA along with symptoms, that mitral valve replacement is indicated.  °16. However pulmonary pressures are high (60-63mmHg). Although leaflets are thin, pliable and without subvalvular thickening, she does have a calcified density on P1 which would likely   increase risk with balloon valvuloplasty.  17. Rheumatic moderate mitral stenosis.  FINDINGS  Left Ventricle: Left ventricular ejection fraction, by visual estimation, is 55 to 60%. The left ventricle has normal function. There is no left ventricular hypertrophy. Normal left ventricular size.  Right Ventricle: The right ventricular size is normal. No increase in right ventricular wall thickness. Global RV systolic function is has normal systolic function. The tricuspid regurgitant velocity is 3.86 m/s, and with an assumed right atrial pressure  of 3 mmHg, the estimated right ventricular systolic pressure is moderately elevated at 62.6 mmHg. Findings are consistent with pulmonary hypertension.  Left Atrium: Left atrial size was severely dilated. No LAA thrombus.  Right Atrium: Right atrial size was normal in size  Pericardium: There is no evidence of pericardial effusion.  Mitral Valve: The mitral valve is rheumatic. Moderate-severe mitral valve stenosis by observation. Moderate mitral valve stenosis. Mitral valve area PHT measures 1.18 cm. MV peak  gradient, 14.7 mmHg. Mild mitral valve regurgitation. Moderate stenosis by  pressure half time 1.2cm squared and mean gradient (60mHg). PISA radius 1.2cm, with calculated area of 1.2 cm squared.  Valve leaflets are thin, pliable, no subvalvular thickening, however surrounding P1 (posterior leaflet) there is a calcified fixed nodule (MAC like appearance).  Tricuspid Valve: The tricuspid valve is normal in structure. Tricuspid valve regurgitation is mild by color flow Doppler.  Aortic Valve: The aortic valve is tricuspid. Aortic valve regurgitation is mild by color flow Doppler. Mild aortic valve sclerosis is present, with no evidence of aortic valve stenosis.  Pulmonic Valve: The pulmonic valve was normal in structure. Pulmonic valve regurgitation is not visualized by color flow Doppler.  Aorta: The aortic root, ascending aorta and aortic arch are all structurally normal, with no evidence of dilitation or obstruction.  Venous: The inferior vena cava is normal in size with greater than 50% respiratory variability, suggesting right atrial pressure of 3 mmHg.  Shunts: No ventricular septal defect is seen or detected. There is no evidence of an atrial septal defect. No atrial level shunt detected by color flow Doppler.  Additional Comments: Rheumatic moderate mitral stenosis. However pulmonary pressures are high (60-627mg). Although leaflets are thin, pliable and without subvalvular thickening, she does have a calcified density on P1 which would likely increase risk  with balloon valvuloplasty. Consider stress echo with transmitral gradient measurements. Her moderate to severely elevated pulmonary pressures may indicate along with AFIB and markedly dilated LA along with symptoms, that mitral valve replacement is  indicated.  MITRAL VALVE Normals TRICUSPID VALVE Normals  MV Area (PHT): 1.18 cm TR Peak grad: 59.6 mmHg  MV Peak grad: 14.7 mmHg 4 mmHg TR Vmax: 388.00 cm/s 288 cm/s  MV Mean grad: 8.0 mmHg    MV Vmax: 1.92 m/s  MV Vmean: 135.0 cm/s  MV VTI: 0.61 m  MV PHT: 186.00 msec 55 ms  MR Peak grad: 14.9 mmHg  MR Mean grad: 9.0 mmHg  MR Vmax: 193.00 cm/s  MR Vmean: 144.0 cm/s  MR PISA: 9.05 cm  MR PISA Eff ROA: 120 mm  MR PISA Radius: 1.20 cm  MaCandee FurbishD  Electronically signed by MaCandee FurbishD  Signature Date/Time: 10/17/2018/2:13:43 PM       RIGHT/LEFT HEART CATH AND CORONARY ANGIOGRAPHY  Conclusion  Hemodynamic findings consistent with mild pulmonary hypertension.  LV end diastolic pressure is normal.  There is severe mitral valve stenosis -suggested by a echocardiogram, large PCWP V waves along with elevated PCWP but normal LVEDP would also  suggest this.  Angiographically normal coronary arteries SUMMARY  Angiographically normal coronary arteries  Mild pulmonary hypertension with mildly elevated PCWP but normal LVEDP suggestive of mitral stenosis  Significant V wave noted on PCWP waveform suggesting mitral stenosis. RECOMMENDATIONS  Return to nursing unit for ongoing care.  Proceed with plans for mitral valve replacement Glenetta Hew, M.D., M.S.  Interventional Cardiologist   Pager # [phone]  Phone # [phone]  [address] [place], [place] [address]   Recommendations  Antiplatelet/Anticoag No indication for antiplatelet therapy at this time .  Surgeon Notes    10/17/2018 1:06 PM CV Procedure addendum by Jerline Pain, MD  Indications  Severe mitral stenosis by prior echocardiogram [I05.0 (ICD-10-CM)]  Procedural Details  Technical Details PCP: Lawerance Cruel, MD Referring Cardiologist: Dr. Skeet Latch  73 y.o. female 67F with mild aortic regurgitation, moderate to severe mitral stenosis, Rheumatic mitral valve disease, chronic pericardial effusion, paroxysmal atrial flutter s/p ablation 05/2017, and hypertension here with syncope. She was found to have severe mitral stenosis by echocardiogram during this hospitalization and is  referred for right and of heart catheterization as part of pre-mitral valve replacement surgery.  Time Out: Verified patient identification, verified procedure, site/side was marked, verified correct patient position, special equipment/implants available, medications/allergies/relevent history reviewed, required imaging and test results available. Performed.   Access:  Right radial Artery: 6 Fr sheath -- Seldinger technique using Micropuncture Kit;  -- Direct ultrasound guidance used. Permanent image obtained and placed on chart. -- 10 mL radial cocktail IA x 2 (total of 5 mg verapamil);  --> Despite attempting to use a Glidewire and 4 [place] catheter, I was not able to direct the catheter and wire into the main channel radial artery into the brachial artery. After multiple attempts, chose to abandon radial access and switched to femoral access. *Right Common Femoral Artery: 4 Fr Sheath - fluoroscopically guided modified Seldinger Technique -- Direct ultrasound guidance used. Permanent image obtained and placed on chart.  * Right Brachial/Antecubital Vein: The existing 18-gauge IV was exchanged over a wire for a 5Fr sheath  Right Heart Catheterization: 5 Fr Gordy Councilman catheter advanced under fluoroscopy with balloon inflated to the RA, RV, then PCWP-PA for hemodynamic measurement.  * Simultaneous FA & PA blood gases checked for SaO2% to calculate FICK CO/CI  * Catheter removed completely out of the body with balloon deflated.  Left Heart Catheterization: 4 Fr Catheters advanced or exchanged over a J-wire under direct fluoroscopic guidance into the ascending aorta; JL4 catheter advanced first.  * LV Hemodynamics (no LV Gram): JL4 catheter * Left Coronary Artery Cineangiography: JL4 catheter  * Right Coronary Artery Cineangiography: JR4 catheter   Upon completion of Angiogaphy, the catheter was removed completely out of the body over a wire, without complication.  Femoral & Brachial Sheath(s)  removed in the Cath Lab with manual pressure for hemostasis.   Radial sheath removed in the Cardiac Catheterization lab with TR Band placed for hemostasis (prior to femoral access).  TR Band: 1120 Hours; 11 mL air   MEDICATIONS * SQ Lidocaine 66m * Radial Cocktail: 3 mg Verapmil in 10 mL NS * Isovue Contrast: 40  Fluoro time: 14.7 minutes. Dose Area Product: 5.6 Gycm2. Cumulative Air Kerma: 110.5 mGy.   Estimated blood loss <50 mL.   During this procedure medications were administered to achieve and maintain moderate conscious sedation while the patient's heart rate, blood pressure, and oxygen saturation were continuously monitored and I was present face-to-face  100% of this time.  ° ° °Coronary Findings  °Diagnostic  °Dominance: Right  °Left Main  °Vessel was injected. Vessel is normal in caliber and large. Vessel is angiographically normal.  °Left Anterior Descending  °Vessel was injected. Vessel is normal in caliber and large. Vessel is angiographically normal.  °First Diagonal Branch  °Vessel is small in size.  °Second Diagonal Branch  °Vessel is small in size.  °Third Diagonal Branch  °Vessel is small in size.  °Left Circumflex  °Vessel was injected. Vessel is normal in caliber and large. Vessel is angiographically normal.  °Left Posterior Atrioventricular Artery  °Vessel is small in size.  °Right Coronary Artery  °Vessel was injected. Vessel is normal in caliber and large. Vessel is angiographically normal.  °Right Ventricular Branch  °Vessel is small in size.  °First Right Posterolateral Branch  °Vessel is small in size.  °Second Right Posterolateral Branch  °Vessel is small in size.  °Intervention  °No interventions have been documented.  °Right Heart  °Right Heart Pressures Hemodynamic findings consistent with mild pulmonary hypertension. PA P 49/15 mmHg-mean 38 mmHg °PCWP 17 million mercury with a V wave of 27 mmHg LV EDP is normal. Mild: LV P-EDP 151/4 mmHg - 9 mmHg AOP-MAP: 150/54 mmHg -  90 mmHg ° °Ao sat 99%, PA sat 80%. ° °FICK CARDIAC OUTPUT-INDEX: 6.78-4.05  °Right Atrium Right atrial pressure is normal. RAP 2 mmHg  °Right Ventricle RVP-EDP 50/1 mmHg-4 mmHg  °Wall Motion  °Resting    °No LV gram  °  °Left Heart  °Mitral Valve There is severe mitral valve stenosis. By echo Calcified  °Coronary Diagrams  °Diagnostic  °Dominance: Right  ° °Intervention  °Implants  °   °No implant documentation for this case.  °Syngo Images  Link to Procedure Log   °Show images for CARDIAC CATHETERIZATION Procedure Log  °Images on Long Term Storage    °Show images for Velazquez, Denasia S   °Hemo Data  ° Most Recent Value  °Fick Cardiac Output 6.78 L/min  °Fick Cardiac Output Index 4.05 (L/min)/BSA  °RA A Wave 2 mmHg  °RA V Wave 3 mmHg  °RA Mean 1 mmHg  °RV Systolic Pressure 50 mmHg  °RV Diastolic Pressure 0 mmHg  °RV EDP 4 mmHg  °PA Systolic Pressure 49 mmHg  °PA Diastolic Pressure 15 mmHg  °PA Mean 30 mmHg  °PW A Wave 16 mmHg  °PW V Wave 27 mmHg  °PW Mean 17 mmHg  °AO Systolic Pressure 159 mmHg  °AO Diastolic Pressure 57 mmHg  °AO Mean 95 mmHg  °LV Systolic Pressure 151 mmHg  °LV Diastolic Pressure 4 mmHg  °LV EDP 9 mmHg  °AOp Systolic Pressure 144 mmHg  °AOp Diastolic Pressure 51 mmHg  °AOp Mean Pressure 86 mmHg  °LVp Systolic Pressure 149 mmHg  °LVp Diastolic Pressure 3 mmHg  °LVp EDP Pressure 8 mmHg  °QP/QS 1  °TPVR Index 7.4 HRUI  °TSVR Index 23.43 HRUI  °PVR SVR Ratio 0.14  °TPVR/TSVR Ratio 0.32  ° ° ° ° °CT ANGIOGRAPHY CHEST WITH CONTRAST  °TECHNIQUE:  °Multidetector CT imaging of the chest was performed using the  °standard protocol during bolus administration of intravenous  °contrast. Multiplanar CT image reconstructions and MIPs were  °obtained to evaluate the vascular anatomy.  °CONTRAST: 100 cc Isovue  °COMPARISON: None.  °FINDINGS:  °Images of the thoracic inlet are unremarkable. No mediastinal  °hematoma or adenopathy.  °The study is of excellent technical quality. No pulmonary embolus is  °noted. There   is  moderate pericardial effusion measures 1.4 cm  °thickness posteriorly. No anterior pericardial effusion.  °No hilar adenopathy is noted. Images of the lung parenchyma shows no  °pulmonary edema. Mild geographic ground-glass parenchymal  °attenuation most likely due to atelectasis. There is atelectasis or  °infiltrate in left lower lobe posteriorly retrocardiac.  °Central airways are patent.  °No destructive rib lesions are noted. The patient is status  °postcholecystectomy. No adrenal gland mass is noted.  °Review of the MIP images confirms the above findings.  °IMPRESSION:  °1. No pulmonary embolus is noted.  °2. No mediastinal hematoma or adenopathy.  °3. There is moderate size pericardial effusion measures 1.4 cm in  °thickness posteriorly.  °4. There is atelectasis or infiltrate in left lower lobe posteriorly  °retrocardiac.  °5. No pulmonary edema. Mild atelectasis bilaterally.  °Electronically Signed  °By: Liviu Pop M.D.  °On: 09/18/2015 15:21  °CT ABDOMEN AND PELVIS WITH CONTRAST  °TECHNIQUE:  °Multidetector CT imaging of the abdomen and pelvis was performed  °using the standard protocol following bolus administration of  °intravenous contrast.  °CONTRAST: 100mL ISOVUE-300  °COMPARISON: 03/04/2017  °FINDINGS:  °Lower chest: Small right-sided pleural effusion is noted. Mild  °bibasilar atelectatic changes are noted left greater than right.  °Cardiac enlargement is noted. Pericardial effusion is noted as well  °similar to that seen on prior CCTA  °Hepatobiliary: Diffuse decreased attenuation is identified  °consistent with fatty infiltration. The gallbladder has been  °surgically removed.  °Pancreas: Unremarkable. No pancreatic ductal dilatation or  °surrounding inflammatory changes.  °Spleen: Normal in size without focal abnormality.  °Adrenals/Urinary Tract: Adrenal glands are within normal limits  °bilaterally. The kidneys are well visualize without renal calculi or  °obstructive changes. Bladder is  partially distended.  °Stomach/Bowel: The appendix has been surgically removed. No  °obstructive or inflammatory changes are identified.  °Vascular/Lymphatic: Aortic atherosclerosis. No enlarged abdominal or  °pelvic lymph nodes.  °Reproductive: Status post hysterectomy. No adnexal masses.  °Other: No abdominal wall hernia or abnormality. No abdominopelvic  °ascites.  °Musculoskeletal: Degenerative changes of lumbar spine are noted.  °IMPRESSION:  °Bibasilar atelectasis and right-sided pleural effusion.  °Stable cardiac enlargement and pericardial effusion.  °Chronic changes as described above.  °Electronically Signed  °By: Mark Lukens M.D.  °On: 05/15/2017 19:45  ° ° ° °  °Impression: °  °Patient has likely rheumatic mitral valve disease with stage D severe symptomatic mitral stenosis and recurrent paroxysmal atrial fibrillation with history of catheter-based Afib ablation in May of this year and previous history of recurrent persistent atrial fibrillation requiring DC cardioversion on multiple occasions.  The patient describes stable symptoms of exertional shortness of breath and fatigue consistent with chronic diastolic congestive heart failure, [place] Heart Association functional class III.  She was hospitalized in September following a brief syncopal event that may have been related to increased dose of beta-blocker following an episode of paroxysmal atrial fibrillation. °  °I have personally reviewed the patient's transthoracic and transesophageal echocardiograms which demonstrate the presence of rheumatic appearance of mitral valve with severe mitral stenosis and mild mitral regurgitation.  Mean transvalvular gradient across mitral valve was estimated 8 mmHg but the mitral valve area was notably estimated only 1.2 cm² with pressure half-time reported 140 ms on transthoracic echocardiogram and 186 ms on transesophageal echocardiogram.  There is at least mild central aortic insufficiency.  There is an  eccentric area of severe calcification in the mitral valve which would preclude primary balloon valvuloplasty as an option for treatment.  Left ventricular size and systolic function remains normal.    There is severe left atrial enlargement.  Right ventricular size and function is normal.  There is mild central tricuspid regurgitation.  Catheterization is notable for the absence of significant coronary artery disease.  There is mild pulmonary hypertension.  CT angiogram of the chest performed in 2017 reveals no signs of significant atherosclerotic disease involving the thoracic aorta and CT scan of the abdomen and pelvis with intravenous contrast performed more recently in 2019 reveals only mild atherosclerotic disease involving the abdominal aorta and iliac vessels.  At present the patient is maintaining sinus rhythm although she has had at least 2 episodes of recurrent paroxysmal atrial fibrillation in the recent past. °  °I agree the patient would benefit from mitral valve replacement and she would likely additionally benefit from a concomitant Maze procedure.  She appears to be an acceptable candidate for minimally invasive approach for surgery. °  °  °  °Plan: °  °The patient and her husband were again counseled at length regarding the indications, risks and potential benefits of mitral valve replacement.  The rationale for elective surgery has been explained, including a comparison between surgery and continued medical therapy with close follow-up.  The relative risks and benefits of performing a maze procedure at the time of her surgery was discussed at length, including the expected likelihood of long term freedom from recurrent symptomatic atrial fibrillation and/or atrial flutter.  Alternative surgical approaches have been discussed including a comparison between conventional sternotomy and minimally-invasive techniques.  The relative risks and benefits of each have been reviewed as they pertain to the  patient's specific circumstances, and expectations for the patient's postoperative convalescence has been discussed.  We discussed the possibility of replacing the mitral valve using a mechanical prosthesis with the attendant need for long-term anticoagulation versus the alternative of replacing it using a bioprosthetic tissue valve with its potential for late structural valve deterioration and failure, depending upon the patient's longevity.  The patient specifically requests that her mitral valve must be replaced using a bioprosthetic tissue valve.   The relative risks and benefits of performing a maze procedure at the time of her surgery was discussed at length, including the expected likelihood of long term freedom from recurrent symptomatic atrial fibrillation and/or atrial flutter.   °  °The patient understands and accepts all potential risks of surgery including but not limited to risk of death, stroke or other neurologic complication, myocardial infarction, congestive heart failure, respiratory failure, renal failure, bleeding requiring transfusion and/or reexploration, arrhythmia, infection or other wound complications, pneumonia, pleural and/or pericardial effusion, pulmonary embolus, aortic dissection or other major vascular complication, or delayed complications related to valve repair or replacement including but not limited to structural valve deterioration and failure, thrombosis, embolization, endocarditis, or paravalvular leak.  Specific risks potentially related to the minimally-invasive approach were discussed at length, including but not limited to risk of conversion to full or partial sternotomy, aortic dissection or other major vascular complication, unilateral acute lung injury or pulmonary edema, phrenic nerve dysfunction or paralysis, rib fracture, chronic pain, lung hernia, or lymphocele.  All of their questions have been answered. °  °  °   °  °  °Clarence H. Owen, MD °12/11/2018 °12:53  PM °  °  ° °

## 2018-12-13 ENCOUNTER — Encounter (HOSPITAL_COMMUNITY)
Admission: RE | Disposition: A | Discharge status: Home / Self Care | Payer: Self-pay | Source: Home / Self Care | Attending: Thoracic Surgery (Cardiothoracic Vascular Surgery)

## 2018-12-13 ENCOUNTER — Inpatient Hospital Stay (HOSPITAL_COMMUNITY): Payer: Medicare Other

## 2018-12-13 ENCOUNTER — Inpatient Hospital Stay (HOSPITAL_COMMUNITY): Payer: Medicare Other | Admitting: Certified Registered"

## 2018-12-13 ENCOUNTER — Other Ambulatory Visit: Payer: Self-pay

## 2018-12-13 ENCOUNTER — Inpatient Hospital Stay (HOSPITAL_COMMUNITY)
Admission: RE | Admit: 2018-12-13 | Discharge: 2018-12-20 | DRG: 220 | Disposition: A | Discharge status: Home / Self Care | Payer: Medicare Other | Attending: Thoracic Surgery (Cardiothoracic Vascular Surgery) | Admitting: Thoracic Surgery (Cardiothoracic Vascular Surgery)

## 2018-12-13 ENCOUNTER — Encounter (HOSPITAL_COMMUNITY): Payer: Self-pay

## 2018-12-13 ENCOUNTER — Inpatient Hospital Stay (HOSPITAL_COMMUNITY): Payer: Medicare Other | Admitting: Physician Assistant

## 2018-12-13 DIAGNOSIS — I4891 Unspecified atrial fibrillation: Secondary | ICD-10-CM

## 2018-12-13 DIAGNOSIS — I480 Paroxysmal atrial fibrillation: Secondary | ICD-10-CM | POA: Diagnosis present

## 2018-12-13 DIAGNOSIS — E669 Obesity, unspecified: Secondary | ICD-10-CM | POA: Diagnosis present

## 2018-12-13 DIAGNOSIS — I4892 Unspecified atrial flutter: Secondary | ICD-10-CM | POA: Diagnosis present

## 2018-12-13 DIAGNOSIS — D6959 Other secondary thrombocytopenia: Secondary | ICD-10-CM | POA: Diagnosis not present

## 2018-12-13 DIAGNOSIS — I052 Rheumatic mitral stenosis with insufficiency: Secondary | ICD-10-CM | POA: Diagnosis present

## 2018-12-13 DIAGNOSIS — Z833 Family history of diabetes mellitus: Secondary | ICD-10-CM

## 2018-12-13 DIAGNOSIS — Z7901 Long term (current) use of anticoagulants: Secondary | ICD-10-CM | POA: Diagnosis not present

## 2018-12-13 DIAGNOSIS — Z6832 Body mass index (BMI) 32.0-32.9, adult: Secondary | ICD-10-CM

## 2018-12-13 DIAGNOSIS — I313 Pericardial effusion (noninflammatory): Secondary | ICD-10-CM | POA: Diagnosis present

## 2018-12-13 DIAGNOSIS — I050 Rheumatic mitral stenosis: Secondary | ICD-10-CM

## 2018-12-13 DIAGNOSIS — I272 Pulmonary hypertension, unspecified: Secondary | ICD-10-CM | POA: Diagnosis present

## 2018-12-13 DIAGNOSIS — Z9842 Cataract extraction status, left eye: Secondary | ICD-10-CM

## 2018-12-13 DIAGNOSIS — Z953 Presence of xenogenic heart valve: Secondary | ICD-10-CM

## 2018-12-13 DIAGNOSIS — D62 Acute posthemorrhagic anemia: Secondary | ICD-10-CM | POA: Diagnosis not present

## 2018-12-13 DIAGNOSIS — K219 Gastro-esophageal reflux disease without esophagitis: Secondary | ICD-10-CM | POA: Diagnosis present

## 2018-12-13 DIAGNOSIS — I10 Essential (primary) hypertension: Secondary | ICD-10-CM | POA: Diagnosis present

## 2018-12-13 DIAGNOSIS — Z90710 Acquired absence of both cervix and uterus: Secondary | ICD-10-CM

## 2018-12-13 DIAGNOSIS — Z841 Family history of disorders of kidney and ureter: Secondary | ICD-10-CM

## 2018-12-13 DIAGNOSIS — F419 Anxiety disorder, unspecified: Secondary | ICD-10-CM | POA: Diagnosis present

## 2018-12-13 DIAGNOSIS — I083 Combined rheumatic disorders of mitral, aortic and tricuspid valves: Secondary | ICD-10-CM | POA: Diagnosis present

## 2018-12-13 DIAGNOSIS — I5032 Chronic diastolic (congestive) heart failure: Secondary | ICD-10-CM | POA: Diagnosis present

## 2018-12-13 DIAGNOSIS — H409 Unspecified glaucoma: Secondary | ICD-10-CM | POA: Diagnosis present

## 2018-12-13 DIAGNOSIS — Z888 Allergy status to other drugs, medicaments and biological substances status: Secondary | ICD-10-CM | POA: Diagnosis not present

## 2018-12-13 DIAGNOSIS — Z9889 Other specified postprocedural states: Secondary | ICD-10-CM

## 2018-12-13 DIAGNOSIS — W1830XA Fall on same level, unspecified, initial encounter: Secondary | ICD-10-CM | POA: Diagnosis not present

## 2018-12-13 DIAGNOSIS — Z9689 Presence of other specified functional implants: Secondary | ICD-10-CM

## 2018-12-13 DIAGNOSIS — R110 Nausea: Secondary | ICD-10-CM | POA: Diagnosis not present

## 2018-12-13 DIAGNOSIS — Z9841 Cataract extraction status, right eye: Secondary | ICD-10-CM

## 2018-12-13 DIAGNOSIS — I4819 Other persistent atrial fibrillation: Secondary | ICD-10-CM | POA: Diagnosis present

## 2018-12-13 DIAGNOSIS — Z8249 Family history of ischemic heart disease and other diseases of the circulatory system: Secondary | ICD-10-CM | POA: Diagnosis not present

## 2018-12-13 DIAGNOSIS — I110 Hypertensive heart disease with heart failure: Secondary | ICD-10-CM | POA: Diagnosis present

## 2018-12-13 DIAGNOSIS — Z880 Allergy status to penicillin: Secondary | ICD-10-CM

## 2018-12-13 DIAGNOSIS — Z20828 Contact with and (suspected) exposure to other viral communicable diseases: Secondary | ICD-10-CM | POA: Diagnosis present

## 2018-12-13 DIAGNOSIS — D72829 Elevated white blood cell count, unspecified: Secondary | ICD-10-CM | POA: Diagnosis not present

## 2018-12-13 DIAGNOSIS — Z419 Encounter for procedure for purposes other than remedying health state, unspecified: Secondary | ICD-10-CM

## 2018-12-13 DIAGNOSIS — I342 Nonrheumatic mitral (valve) stenosis: Secondary | ICD-10-CM

## 2018-12-13 DIAGNOSIS — I440 Atrioventricular block, first degree: Secondary | ICD-10-CM | POA: Diagnosis not present

## 2018-12-13 DIAGNOSIS — I351 Nonrheumatic aortic (valve) insufficiency: Secondary | ICD-10-CM | POA: Diagnosis present

## 2018-12-13 DIAGNOSIS — Z961 Presence of intraocular lens: Secondary | ICD-10-CM | POA: Diagnosis present

## 2018-12-13 DIAGNOSIS — Y92230 Patient room in hospital as the place of occurrence of the external cause: Secondary | ICD-10-CM | POA: Diagnosis not present

## 2018-12-13 DIAGNOSIS — Z8679 Personal history of other diseases of the circulatory system: Secondary | ICD-10-CM

## 2018-12-13 DIAGNOSIS — G4733 Obstructive sleep apnea (adult) (pediatric): Secondary | ICD-10-CM | POA: Diagnosis present

## 2018-12-13 DIAGNOSIS — Z79899 Other long term (current) drug therapy: Secondary | ICD-10-CM

## 2018-12-13 DIAGNOSIS — J9811 Atelectasis: Secondary | ICD-10-CM

## 2018-12-13 HISTORY — PX: TEE WITHOUT CARDIOVERSION: SHX5443

## 2018-12-13 HISTORY — DX: Presence of xenogenic heart valve: Z95.3

## 2018-12-13 HISTORY — DX: Personal history of other diseases of the circulatory system: Z86.79

## 2018-12-13 HISTORY — PX: MITRAL VALVE REPLACEMENT: SHX147

## 2018-12-13 HISTORY — DX: Personal history of other diseases of the circulatory system: Z98.890

## 2018-12-13 HISTORY — PX: MINIMALLY INVASIVE MAZE PROCEDURE: SHX6244

## 2018-12-13 LAB — CBC
HCT: 31 % — ABNORMAL LOW (ref 36.0–46.0)
HCT: 25.6 % — ABNORMAL LOW (ref 36.0–46.0)
Hemoglobin: 10.5 g/dL — ABNORMAL LOW (ref 12.0–15.0)
Hemoglobin: 8.5 g/dL — ABNORMAL LOW (ref 12.0–15.0)
MCH: 31.9 pg (ref 26.0–34.0)
MCH: 31.7 pg (ref 26.0–34.0)
MCHC: 33.9 g/dL (ref 30.0–36.0)
MCHC: 33.2 g/dL (ref 30.0–36.0)
MCV: 94.2 fL (ref 80.0–100.0)
MCV: 95.5 fL (ref 80.0–100.0)
Platelets: 116 10*3/uL — ABNORMAL LOW (ref 150–400)
Platelets: 104 10*3/uL — ABNORMAL LOW (ref 150–400)
RBC: 3.29 MIL/uL — ABNORMAL LOW (ref 3.87–5.11)
RBC: 2.68 MIL/uL — ABNORMAL LOW (ref 3.87–5.11)
RDW: 13.7 % (ref 11.5–15.5)
RDW: 13.9 % (ref 11.5–15.5)
WBC: 10.3 10*3/uL (ref 4.0–10.5)
WBC: 9.9 10*3/uL (ref 4.0–10.5)
nRBC: 0 % (ref 0.0–0.2)
nRBC: 0 % (ref 0.0–0.2)

## 2018-12-13 LAB — GLUCOSE, CAPILLARY
Glucose-Capillary: 107 mg/dL — ABNORMAL HIGH (ref 70–99)
Glucose-Capillary: 113 mg/dL — ABNORMAL HIGH (ref 70–99)
Glucose-Capillary: 108 mg/dL — ABNORMAL HIGH (ref 70–99)
Glucose-Capillary: 85 mg/dL (ref 70–99)
Glucose-Capillary: 107 mg/dL — ABNORMAL HIGH (ref 70–99)
Glucose-Capillary: 99 mg/dL (ref 70–99)

## 2018-12-13 LAB — POCT I-STAT 7, (LYTES, BLD GAS, ICA,H+H)
Acid-base deficit: 2 mmol/L (ref 0.0–2.0)
Bicarbonate: 23.4 mmol/L (ref 20.0–28.0)
Bicarbonate: 23.1 mmol/L (ref 20.0–28.0)
Calcium, Ion: 0.84 mmol/L — CL (ref 1.15–1.40)
Calcium, Ion: 0.98 mmol/L — ABNORMAL LOW (ref 1.15–1.40)
HCT: 28 % — ABNORMAL LOW (ref 36.0–46.0)
HCT: 22 % — ABNORMAL LOW (ref 36.0–46.0)
Hemoglobin: 9.5 g/dL — ABNORMAL LOW (ref 12.0–15.0)
Hemoglobin: 7.5 g/dL — ABNORMAL LOW (ref 12.0–15.0)
O2 Saturation: 100 %
O2 Saturation: 99 %
Patient temperature: 95.1
Patient temperature: 36.9
Potassium: 3.2 mmol/L — ABNORMAL LOW (ref 3.5–5.1)
Potassium: 3.4 mmol/L — ABNORMAL LOW (ref 3.5–5.1)
Sodium: 149 mmol/L — ABNORMAL HIGH (ref 135–145)
Sodium: 145 mmol/L (ref 135–145)
TCO2: 24 mmol/L (ref 22–32)
TCO2: 24 mmol/L (ref 22–32)
pCO2 arterial: 28.5 mmHg — ABNORMAL LOW (ref 32.0–48.0)
pCO2 arterial: 42 mmHg (ref 32.0–48.0)
pH, Arterial: 7.515 — ABNORMAL HIGH (ref 7.350–7.450)
pH, Arterial: 7.348 — ABNORMAL LOW (ref 7.350–7.450)
pO2, Arterial: 142 mmHg — ABNORMAL HIGH (ref 83.0–108.0)
pO2, Arterial: 166 mmHg — ABNORMAL HIGH (ref 83.0–108.0)

## 2018-12-13 LAB — HEMOGLOBIN AND HEMATOCRIT, BLOOD
HCT: 25.4 % — ABNORMAL LOW (ref 36.0–46.0)
Hemoglobin: 8.6 g/dL — ABNORMAL LOW (ref 12.0–15.0)

## 2018-12-13 LAB — ECHO INTRAOPERATIVE TEE
Height: 60 in
Weight: 2400 oz

## 2018-12-13 LAB — PROTIME-INR
INR: 1.6 — ABNORMAL HIGH (ref 0.8–1.2)
INR: 1.7 — ABNORMAL HIGH (ref 0.8–1.2)
Prothrombin Time: 18.9 seconds — ABNORMAL HIGH (ref 11.4–15.2)
Prothrombin Time: 20.1 seconds — ABNORMAL HIGH (ref 11.4–15.2)

## 2018-12-13 LAB — PLATELET COUNT: Platelets: 143 10*3/uL — ABNORMAL LOW (ref 150–400)

## 2018-12-13 LAB — PREPARE RBC (CROSSMATCH)

## 2018-12-13 LAB — APTT: aPTT: 44 seconds — ABNORMAL HIGH (ref 24–36)

## 2018-12-13 SURGERY — REPLACEMENT, MITRAL VALVE, MINIMALLY INVASIVE
Anesthesia: General | Site: Chest | Laterality: Right

## 2018-12-13 MED ORDER — PHENYLEPHRINE HCL-NACL 20-0.9 MG/250ML-% IV SOLN: 0.0000 ug/min | INTRAVENOUS | Status: DC

## 2018-12-13 MED ORDER — TRAMADOL HCL 50 MG PO TABS: 50.0000 mg | ORAL_TABLET | ORAL | Status: DC | PRN

## 2018-12-13 MED ORDER — MAGNESIUM SULFATE 4 GM/100ML IV SOLN
4.0000 g | Freq: Once | INTRAVENOUS | Status: AC
  Administered 2018-12-13: 17:00:00 4 g via INTRAVENOUS
  Filled 2018-12-13: qty 100

## 2018-12-13 MED ORDER — LACTATED RINGERS IV SOLN
INTRAVENOUS | Status: DC | PRN
  Administered 2018-12-13 (×2): via INTRAVENOUS

## 2018-12-13 MED ORDER — SODIUM CHLORIDE 0.9 % IV SOLN
INTRAVENOUS | Status: DC | PRN
  Administered 2018-12-13: 14:00:00 via INTRAVENOUS

## 2018-12-13 MED ORDER — CHLORHEXIDINE GLUCONATE 0.12 % MT SOLN
15.0000 mL | Freq: Once | OROMUCOSAL | Status: AC
  Administered 2018-12-13: 08:00:00 15 mL via OROMUCOSAL
  Filled 2018-12-13: qty 15

## 2018-12-13 MED ORDER — PANTOPRAZOLE SODIUM 40 MG PO TBEC
40.0000 mg | DELAYED_RELEASE_TABLET | Freq: Every day | ORAL | Status: AC
  Administered 2018-12-15: 40 mg via ORAL
  Filled 2018-12-13: qty 1

## 2018-12-13 MED ORDER — ACETAMINOPHEN 160 MG/5ML PO SOLN: 1000.0000 mg | Freq: Four times a day (QID) | ORAL | Status: DC

## 2018-12-13 MED ORDER — ACETAMINOPHEN 500 MG PO TABS
1000.0000 mg | ORAL_TABLET | Freq: Four times a day (QID) | ORAL | Status: AC
  Administered 2018-12-14 – 2018-12-18 (×17): 1000 mg via ORAL
  Filled 2018-12-13 (×18): qty 2

## 2018-12-13 MED ORDER — LIDOCAINE 2% (20 MG/ML) 5 ML SYRINGE
INTRAMUSCULAR | Status: AC
  Filled 2018-12-13: qty 5

## 2018-12-13 MED ORDER — LACTATED RINGERS IV SOLN: INTRAVENOUS | Status: DC

## 2018-12-13 MED ORDER — MORPHINE SULFATE (PF) 2 MG/ML IV SOLN
1.0000 mg | INTRAVENOUS | Status: DC | PRN
  Administered 2018-12-14: 2 mg via INTRAVENOUS
  Filled 2018-12-13: qty 1

## 2018-12-13 MED ORDER — EPINEPHRINE PF 1 MG/ML IJ SOLN
0.0000 ug/min | INTRAVENOUS | Status: DC
  Filled 2018-12-13 (×2): qty 4

## 2018-12-13 MED ORDER — METOPROLOL TARTRATE 12.5 MG HALF TABLET: 12.5000 mg | ORAL_TABLET | Freq: Once | ORAL | Status: DC

## 2018-12-13 MED ORDER — ALBUMIN HUMAN 5 % IV SOLN
INTRAVENOUS | Status: DC | PRN
  Administered 2018-12-13: 14:00:00 via INTRAVENOUS

## 2018-12-13 MED ORDER — DOCUSATE SODIUM 100 MG PO CAPS
200.0000 mg | ORAL_CAPSULE | Freq: Every day | ORAL | Status: DC
  Administered 2018-12-14 – 2018-12-19 (×4): 200 mg via ORAL
  Filled 2018-12-13 (×6): qty 2

## 2018-12-13 MED ORDER — POTASSIUM CHLORIDE 10 MEQ/50ML IV SOLN
10.0000 meq | INTRAVENOUS | Status: AC
  Administered 2018-12-13 (×3): 10 meq via INTRAVENOUS
  Filled 2018-12-13 (×2): qty 50

## 2018-12-13 MED ORDER — PROPOFOL 10 MG/ML IV BOLUS
INTRAVENOUS | Status: DC | PRN
  Administered 2018-12-13: 40 mg via INTRAVENOUS
  Administered 2018-12-13 (×2): 50 mg via INTRAVENOUS

## 2018-12-13 MED ORDER — FENTANYL CITRATE (PF) 250 MCG/5ML IJ SOLN
INTRAMUSCULAR | Status: DC | PRN
  Administered 2018-12-13: 50 ug via INTRAVENOUS
  Administered 2018-12-13: 100 ug via INTRAVENOUS
  Administered 2018-12-13: 150 ug via INTRAVENOUS
  Administered 2018-12-13 (×2): 50 ug via INTRAVENOUS

## 2018-12-13 MED ORDER — ACETAMINOPHEN 650 MG RE SUPP
650.0000 mg | Freq: Once | RECTAL | Status: AC
  Administered 2018-12-13: 650 mg via RECTAL

## 2018-12-13 MED ORDER — ONDANSETRON HCL 4 MG/2ML IJ SOLN
4.0000 mg | Freq: Four times a day (QID) | INTRAMUSCULAR | Status: DC | PRN
  Administered 2018-12-14 – 2018-12-18 (×6): 4 mg via INTRAVENOUS
  Filled 2018-12-13 (×7): qty 2

## 2018-12-13 MED ORDER — METOPROLOL TARTRATE 5 MG/5ML IV SOLN
2.5000 mg | INTRAVENOUS | Status: DC | PRN
  Administered 2018-12-15: 2.5 mg via INTRAVENOUS
  Administered 2018-12-16 (×2): 5 mg via INTRAVENOUS
  Filled 2018-12-13 (×3): qty 5

## 2018-12-13 MED ORDER — LACTATED RINGERS IV SOLN
INTRAVENOUS | Status: DC | PRN
  Administered 2018-12-13: 09:00:00 via INTRAVENOUS

## 2018-12-13 MED ORDER — HEPARIN SODIUM (PORCINE) 1000 UNIT/ML IJ SOLN
INTRAMUSCULAR | Status: DC | PRN
  Administered 2018-12-13: 23000 [IU] via INTRAVENOUS

## 2018-12-13 MED ORDER — BUPIVACAINE HCL 0.5 % IJ SOLN
INTRAMUSCULAR | Status: DC | PRN
  Administered 2018-12-13: 10 mL

## 2018-12-13 MED ORDER — SODIUM CHLORIDE 0.9% IV SOLUTION
Freq: Once | INTRAVENOUS | Status: AC
  Administered 2018-12-13: 22:00:00 via INTRAVENOUS

## 2018-12-13 MED ORDER — ASPIRIN 81 MG PO CHEW: 324.0000 mg | CHEWABLE_TABLET | Freq: Every day | ORAL | Status: DC

## 2018-12-13 MED ORDER — ALBUMIN HUMAN 5 % IV SOLN
250.0000 mL | INTRAVENOUS | Status: AC | PRN
  Administered 2018-12-13 (×4): 12.5 g via INTRAVENOUS
  Filled 2018-12-13: qty 500
  Filled 2018-12-13: qty 250

## 2018-12-13 MED ORDER — MIDAZOLAM HCL (PF) 10 MG/2ML IJ SOLN
INTRAMUSCULAR | Status: AC
  Filled 2018-12-13: qty 2

## 2018-12-13 MED ORDER — PROTAMINE SULFATE 10 MG/ML IV SOLN
INTRAVENOUS | Status: DC | PRN
  Administered 2018-12-13: 220 mg via INTRAVENOUS
  Administered 2018-12-13: 10 mg via INTRAVENOUS

## 2018-12-13 MED ORDER — GLYCOPYRROLATE 0.2 MG/ML IJ SOLN
INTRAMUSCULAR | Status: DC | PRN
  Administered 2018-12-13: 0.2 mg via INTRAVENOUS

## 2018-12-13 MED ORDER — ACETAMINOPHEN 160 MG/5ML PO SOLN: 650.0000 mg | Freq: Once | ORAL | Status: AC

## 2018-12-13 MED ORDER — ASPIRIN EC 325 MG PO TBEC: 325.0000 mg | DELAYED_RELEASE_TABLET | Freq: Every day | ORAL | Status: DC

## 2018-12-13 MED ORDER — CHLORHEXIDINE GLUCONATE CLOTH 2 % EX PADS
6.0000 | MEDICATED_PAD | Freq: Every day | CUTANEOUS | Status: DC
  Administered 2018-12-13 – 2018-12-15 (×3): 6 via TOPICAL

## 2018-12-13 MED ORDER — ROCURONIUM BROMIDE 100 MG/10ML IV SOLN
INTRAVENOUS | Status: DC | PRN
  Administered 2018-12-13 (×2): 50 mg via INTRAVENOUS
  Administered 2018-12-13: 100 mg via INTRAVENOUS
  Administered 2018-12-13: 50 mg via INTRAVENOUS

## 2018-12-13 MED ORDER — OXYCODONE HCL 5 MG PO TABS: 5.0000 mg | ORAL_TABLET | ORAL | Status: DC | PRN

## 2018-12-13 MED ORDER — DEXMEDETOMIDINE HCL IN NACL 400 MCG/100ML IV SOLN
0.0000 ug/kg/h | INTRAVENOUS | Status: DC
  Administered 2018-12-13: 0.7 ug/kg/h via INTRAVENOUS
  Filled 2018-12-13: qty 200

## 2018-12-13 MED ORDER — SODIUM CHLORIDE 0.9% FLUSH: 3.0000 mL | Freq: Two times a day (BID) | INTRAVENOUS | Status: DC

## 2018-12-13 MED ORDER — BISACODYL 10 MG RE SUPP: 10.0000 mg | Freq: Every day | RECTAL | Status: DC

## 2018-12-13 MED ORDER — SODIUM CHLORIDE 0.45 % IV SOLN: INTRAVENOUS | Status: DC | PRN

## 2018-12-13 MED ORDER — SODIUM CHLORIDE 0.9 % IV SOLN: INTRAVENOUS | Status: DC

## 2018-12-13 MED ORDER — FAMOTIDINE IN NACL 20-0.9 MG/50ML-% IV SOLN
20.0000 mg | Freq: Two times a day (BID) | INTRAVENOUS | Status: AC
  Administered 2018-12-13 – 2018-12-14 (×2): 20 mg via INTRAVENOUS
  Filled 2018-12-13 (×2): qty 50

## 2018-12-13 MED ORDER — ARTIFICIAL TEARS OPHTHALMIC OINT
TOPICAL_OINTMENT | OPHTHALMIC | Status: DC | PRN
  Administered 2018-12-13: 1 via OPHTHALMIC

## 2018-12-13 MED ORDER — DEXTROSE 50 % IV SOLN: 0.0000 mL | INTRAVENOUS | Status: DC | PRN

## 2018-12-13 MED ORDER — PROTAMINE SULFATE 10 MG/ML IV SOLN
INTRAVENOUS | Status: AC
  Filled 2018-12-13: qty 25

## 2018-12-13 MED ORDER — SODIUM CHLORIDE 0.9 % IV SOLN
INTRAVENOUS | Status: DC
  Administered 2018-12-13: 17:00:00 via INTRAVENOUS

## 2018-12-13 MED ORDER — PROPOFOL 10 MG/ML IV BOLUS
INTRAVENOUS | Status: AC
  Filled 2018-12-13: qty 40

## 2018-12-13 MED ORDER — SODIUM CHLORIDE 0.9% FLUSH
3.0000 mL | INTRAVENOUS | Status: DC | PRN
  Administered 2018-12-19: 3 mL via INTRAVENOUS
  Filled 2018-12-13: qty 3

## 2018-12-13 MED ORDER — CHLORHEXIDINE GLUCONATE 4 % EX LIQD
30.0000 mL | CUTANEOUS | Status: DC
  Administered 2018-12-13: 30 mL via TOPICAL

## 2018-12-13 MED ORDER — INSULIN REGULAR(HUMAN) IN NACL 100-0.9 UT/100ML-% IV SOLN: INTRAVENOUS | Status: DC

## 2018-12-13 MED ORDER — MIDAZOLAM HCL 5 MG/5ML IJ SOLN
INTRAMUSCULAR | Status: DC | PRN
  Administered 2018-12-13: 1 mg via INTRAVENOUS

## 2018-12-13 MED ORDER — ARTIFICIAL TEARS OPHTHALMIC OINT
TOPICAL_OINTMENT | OPHTHALMIC | Status: AC
  Filled 2018-12-13: qty 3.5

## 2018-12-13 MED ORDER — SODIUM CHLORIDE 0.9 % IV SOLN
1.5000 g | Freq: Two times a day (BID) | INTRAVENOUS | Status: AC
  Administered 2018-12-13 – 2018-12-15 (×4): 1.5 g via INTRAVENOUS
  Filled 2018-12-13 (×4): qty 1.5

## 2018-12-13 MED ORDER — SUGAMMADEX SODIUM 500 MG/5ML IV SOLN
INTRAVENOUS | Status: DC | PRN
  Administered 2018-12-13 (×2): 200 mg via INTRAVENOUS

## 2018-12-13 MED ORDER — CHLORHEXIDINE GLUCONATE 0.12 % MT SOLN
15.0000 mL | OROMUCOSAL | Status: AC
  Administered 2018-12-13: 17:00:00 15 mL via OROMUCOSAL

## 2018-12-13 MED ORDER — 0.9 % SODIUM CHLORIDE (POUR BTL) OPTIME
TOPICAL | Status: DC | PRN
  Administered 2018-12-13: 1000 mL
  Administered 2018-12-13: 11:00:00 3000 mL

## 2018-12-13 MED ORDER — SODIUM CHLORIDE 0.9 % IV SOLN: 250.0000 mL | INTRAVENOUS | Status: DC

## 2018-12-13 MED ORDER — EPHEDRINE SULFATE 50 MG/ML IJ SOLN
INTRAMUSCULAR | Status: DC | PRN
  Administered 2018-12-13 (×2): 5 mg via INTRAVENOUS

## 2018-12-13 MED ORDER — NITROGLYCERIN IN D5W 200-5 MCG/ML-% IV SOLN: 0.0000 ug/min | INTRAVENOUS | Status: DC

## 2018-12-13 MED ORDER — ROCURONIUM BROMIDE 10 MG/ML (PF) SYRINGE
PREFILLED_SYRINGE | INTRAVENOUS | Status: AC
  Filled 2018-12-13: qty 20

## 2018-12-13 MED ORDER — VANCOMYCIN HCL IN DEXTROSE 1-5 GM/200ML-% IV SOLN
1000.0000 mg | Freq: Once | INTRAVENOUS | Status: AC
  Administered 2018-12-13: 1000 mg via INTRAVENOUS
  Filled 2018-12-13: qty 200

## 2018-12-13 MED ORDER — SODIUM CHLORIDE 0.9% FLUSH: 10.0000 mL | INTRAVENOUS | Status: DC | PRN

## 2018-12-13 MED ORDER — FENTANYL CITRATE (PF) 250 MCG/5ML IJ SOLN
INTRAMUSCULAR | Status: AC
  Filled 2018-12-13: qty 25

## 2018-12-13 MED ORDER — LACTATED RINGERS IV SOLN: 500.0000 mL | Freq: Once | INTRAVENOUS | Status: DC | PRN

## 2018-12-13 MED ORDER — SODIUM CHLORIDE 0.9 % IR SOLN
Status: DC | PRN
  Administered 2018-12-13: 3000 mL

## 2018-12-13 MED ORDER — BUPIVACAINE 0.5 % ON-Q PUMP SINGLE CATH 400 ML
400.0000 mL | INJECTION | Status: DC
  Filled 2018-12-13: qty 400

## 2018-12-13 MED ORDER — BISACODYL 5 MG PO TBEC
10.0000 mg | DELAYED_RELEASE_TABLET | Freq: Every day | ORAL | Status: DC
  Administered 2018-12-14 – 2018-12-19 (×4): 10 mg via ORAL
  Filled 2018-12-13 (×6): qty 2

## 2018-12-13 MED ORDER — LIDOCAINE 2% (20 MG/ML) 5 ML SYRINGE
INTRAMUSCULAR | Status: DC | PRN
  Administered 2018-12-13: 60 mg via INTRAVENOUS

## 2018-12-13 MED ORDER — LACTATED RINGERS IV SOLN
INTRAVENOUS | Status: DC | PRN
  Administered 2018-12-13 (×2): via INTRAVENOUS

## 2018-12-13 MED ORDER — BUPIVACAINE HCL (PF) 0.5 % IJ SOLN
INTRAMUSCULAR | Status: AC
  Filled 2018-12-13: qty 10

## 2018-12-13 MED ORDER — MIDAZOLAM HCL 2 MG/2ML IJ SOLN
2.0000 mg | INTRAMUSCULAR | Status: DC | PRN
  Administered 2018-12-13: 2 mg via INTRAVENOUS
  Filled 2018-12-13: qty 2

## 2018-12-13 SURGICAL SUPPLY — 125 items
ADAPTER CARDIO PERF ANTE/RETRO (ADAPTER) ×3 IMPLANT
ARTICLIP LAA PROCLIP II 45 (Clip) ×3 IMPLANT
BAG DECANTER FOR FLEXI CONT (MISCELLANEOUS) ×3 IMPLANT
BLADE CLIPPER SURG (BLADE) ×6 IMPLANT
BLADE STERNUM SYSTEM 6 (BLADE) ×3 IMPLANT
BLADE SURG 11 STRL SS (BLADE) ×3 IMPLANT
CANISTER SUCT 3000ML PPV (MISCELLANEOUS) ×12 IMPLANT
CANNULA FEM VENOUS REMOTE 22FR (CANNULA) ×3 IMPLANT
CANNULA FEMORAL ART 14 SM (MISCELLANEOUS) ×3 IMPLANT
CANNULA GUNDRY RCSP 15FR (MISCELLANEOUS) ×3 IMPLANT
CANNULA OPTISITE PERFUSION 16F (CANNULA) IMPLANT
CANNULA OPTISITE PERFUSION 18F (CANNULA) ×3 IMPLANT
CANNULA SUMP PERICARDIAL (CANNULA) ×3 IMPLANT
CARDIOBLATE CARDIAC ABLATION (MISCELLANEOUS)
CATH CPB KIT OWEN (MISCELLANEOUS) IMPLANT
CATH KIT ON-Q SILVERSOAK 5IN (CATHETERS) ×3 IMPLANT
CELLS DAT CNTRL 66122 CELL SVR (MISCELLANEOUS) ×2 IMPLANT
CLIP VESOCCLUDE SM WIDE 24/CT (CLIP) ×3 IMPLANT
CONN ST 1/4X3/8  BEN (MISCELLANEOUS) ×2
CONN ST 1/4X3/8 BEN (MISCELLANEOUS) ×4 IMPLANT
CONNECTOR 1/2X3/8X1/2 3 WAY (MISCELLANEOUS) ×2
CONNECTOR 1/2X3/8X1/2 3WAY (MISCELLANEOUS) ×4 IMPLANT
CONT SPEC 4OZ CLIKSEAL STRL BL (MISCELLANEOUS) ×3 IMPLANT
COVER BACK TABLE 24X17X13 BIG (DRAPES) ×6 IMPLANT
COVER PROBE W GEL 5X96 (DRAPES) ×3 IMPLANT
COVER WAND RF STERILE (DRAPES) ×6 IMPLANT
DERMABOND ADVANCED (GAUZE/BANDAGES/DRESSINGS) ×1
DERMABOND ADVANCED .7 DNX12 (GAUZE/BANDAGES/DRESSINGS) ×2 IMPLANT
DEVICE ATRICLIP LAA PRCLPII 45 (Clip) ×2 IMPLANT
DEVICE CARDIOBLATE CARDIAC ABL (MISCELLANEOUS) IMPLANT
DEVICE CLOSURE PERCLS PRGLD 6F (VASCULAR PRODUCTS) ×8 IMPLANT
DEVICE SUT CK QUICK LOAD INDV (Prosthesis & Implant Heart) ×6 IMPLANT
DEVICE SUT CK QUICK LOAD MINI (Prosthesis & Implant Heart) ×3 IMPLANT
DEVICE TROCAR PUNCTURE CLOSURE (ENDOMECHANICALS) ×3 IMPLANT
DRAIN CHANNEL 28F RND 3/8 FF (WOUND CARE) ×6 IMPLANT
DRAPE C-ARM 42X72 X-RAY (DRAPES) ×3 IMPLANT
DRAPE CV SPLIT W-CLR ANES SCRN (DRAPES) ×3 IMPLANT
DRAPE INCISE IOBAN 66X45 STRL (DRAPES) ×6 IMPLANT
DRAPE PERI GROIN 82X75IN TIB (DRAPES) ×6 IMPLANT
DRAPE SLUSH/WARMER DISC (DRAPES) ×3 IMPLANT
DRSG AQUACEL AG ADV 3.5X 4 (GAUZE/BANDAGES/DRESSINGS) ×3 IMPLANT
DRSG COVADERM 4X8 (GAUZE/BANDAGES/DRESSINGS) ×3 IMPLANT
ELECT BLADE 6.5 EXT (BLADE) ×3 IMPLANT
ELECT REM PT RETURN 9FT ADLT (ELECTROSURGICAL) ×6
ELECTRODE REM PT RTRN 9FT ADLT (ELECTROSURGICAL) ×4 IMPLANT
FELT TEFLON 1X6 (MISCELLANEOUS) ×6 IMPLANT
FEMORAL VENOUS CANN RAP (CANNULA) IMPLANT
GAUZE SPONGE 4X4 12PLY STRL (GAUZE/BANDAGES/DRESSINGS) ×3 IMPLANT
GAUZE SPONGE 4X4 16PLY XRAY LF (GAUZE/BANDAGES/DRESSINGS) ×3 IMPLANT
GLOVE BIO SURGEON STRL SZ 6 (GLOVE) ×9 IMPLANT
GLOVE BIO SURGEON STRL SZ 6.5 (GLOVE) ×6 IMPLANT
GLOVE BIO SURGEON STRL SZ7.5 (GLOVE) ×6 IMPLANT
GLOVE BIOGEL PI IND STRL 6 (GLOVE) ×2 IMPLANT
GLOVE BIOGEL PI IND STRL 6.5 (GLOVE) ×4 IMPLANT
GLOVE BIOGEL PI INDICATOR 6 (GLOVE) ×1
GLOVE BIOGEL PI INDICATOR 6.5 (GLOVE) ×2
GLOVE INDICATOR 7.5 STRL GRN (GLOVE) ×3 IMPLANT
GLOVE ORTHO TXT STRL SZ7.5 (GLOVE) ×9 IMPLANT
GOWN STRL REUS W/ TWL LRG LVL3 (GOWN DISPOSABLE) ×14 IMPLANT
GOWN STRL REUS W/TWL LRG LVL3 (GOWN DISPOSABLE) ×7
KIT BASIN OR (CUSTOM PROCEDURE TRAY) ×3 IMPLANT
KIT DEVICE SUT COR-KNOT MIS 5 (INSTRUMENTS) ×3 IMPLANT
KIT DILATOR VASC 18G NDL (KITS) ×3 IMPLANT
KIT DRAINAGE VACCUM ASSIST (KITS) ×3 IMPLANT
KIT SUCTION CATH 14FR (SUCTIONS) ×3 IMPLANT
KIT SUT CK MINI COMBO 4X17 (Prosthesis & Implant Heart) ×3 IMPLANT
KIT TURNOVER KIT B (KITS) ×6 IMPLANT
LEAD PACING MYOCARDI (MISCELLANEOUS) ×3 IMPLANT
LINE VENT (MISCELLANEOUS) ×3 IMPLANT
NEEDLE AORTIC ROOT 14G 7F (CATHETERS) ×3 IMPLANT
NS IRRIG 1000ML POUR BTL (IV SOLUTION) ×9 IMPLANT
PACK E MIN INVASIVE VALVE (SUTURE) ×6 IMPLANT
PACK OPEN HEART (CUSTOM PROCEDURE TRAY) ×3 IMPLANT
PAD ARMBOARD 7.5X6 YLW CONV (MISCELLANEOUS) ×6 IMPLANT
PAD ELECT DEFIB RADIOL ZOLL (MISCELLANEOUS) ×3 IMPLANT
PERCLOSE PROGLIDE 6F (VASCULAR PRODUCTS) ×12
POSITIONER HEAD DONUT 9IN (MISCELLANEOUS) ×6 IMPLANT
PROBE CRYO2-ABLATION MALLABLE (MISCELLANEOUS) ×3 IMPLANT
RTRCTR WOUND ALEXIS 18CM MED (MISCELLANEOUS) ×3
SET CANNULATION TOURNIQUET (MISCELLANEOUS) ×3 IMPLANT
SET CARDIOPLEGIA MPS 5001102 (MISCELLANEOUS) ×3 IMPLANT
SET IRRIG TUBING LAPAROSCOPIC (IRRIGATION / IRRIGATOR) ×3 IMPLANT
SET MICROPUNCTURE 5F STIFF (MISCELLANEOUS) ×3 IMPLANT
SHEATH PINNACLE 8F 10CM (SHEATH) ×9 IMPLANT
SOL ANTI FOG 6CC (MISCELLANEOUS) ×2 IMPLANT
SOLUTION ANTI FOG 6CC (MISCELLANEOUS) ×1
SUT BONE WAX W31G (SUTURE) ×3 IMPLANT
SUT ETHIBON 2 0 V 52N 30 (SUTURE) ×3 IMPLANT
SUT ETHIBOND (SUTURE) IMPLANT
SUT ETHIBOND 2 0 SH (SUTURE) ×3 IMPLANT
SUT ETHIBOND 2 0 V4 (SUTURE) IMPLANT
SUT ETHIBOND 2 0V4 GREEN (SUTURE) IMPLANT
SUT ETHIBOND 2-0 RB-1 WHT (SUTURE) IMPLANT
SUT ETHIBOND 4 0 TF (SUTURE) IMPLANT
SUT ETHIBOND 5 0 C 1 30 (SUTURE) IMPLANT
SUT ETHIBOND NAB MH 2-0 36IN (SUTURE) IMPLANT
SUT ETHIBOND X763 2 0 SH 1 (SUTURE) ×6 IMPLANT
SUT GORETEX 6.0 TH-9 30 IN (SUTURE) IMPLANT
SUT GORETEX CV 4 TH 22 36 (SUTURE) ×6 IMPLANT
SUT GORETEX CV-5THC-13 36IN (SUTURE) IMPLANT
SUT GORETEX CV4 TH-18 (SUTURE) ×12 IMPLANT
SUT GORETEX TH-18 36 INCH (SUTURE) IMPLANT
SUT PROLENE 3 0 SH DA (SUTURE) ×3 IMPLANT
SUT PROLENE 3 0 SH1 36 (SUTURE) ×12 IMPLANT
SUT PROLENE 4 0 RB 1 (SUTURE) ×3
SUT PROLENE 4-0 RB1 .5 CRCL 36 (SUTURE) ×6 IMPLANT
SUT PROLENE 5 0 C 1 36 (SUTURE) ×3 IMPLANT
SUT PROLENE 6 0 C 1 30 (SUTURE) ×6 IMPLANT
SUT SILK  1 MH (SUTURE) ×1
SUT SILK 1 MH (SUTURE) ×2 IMPLANT
SYR 10ML LL (SYRINGE) ×6 IMPLANT
SYSTEM SAHARA CHEST DRAIN ATS (WOUND CARE) ×3 IMPLANT
TAPE CLOTH SURG 4X10 WHT LF (GAUZE/BANDAGES/DRESSINGS) ×3 IMPLANT
TAPE PAPER 2X10 WHT MICROPORE (GAUZE/BANDAGES/DRESSINGS) ×3 IMPLANT
TOWEL GREEN STERILE (TOWEL DISPOSABLE) ×3 IMPLANT
TOWEL GREEN STERILE FF (TOWEL DISPOSABLE) ×3 IMPLANT
TRAY FOLEY SLVR 16FR TEMP STAT (SET/KITS/TRAYS/PACK) ×3 IMPLANT
TROCAR XCEL BLADELESS 5X75MML (TROCAR) ×3 IMPLANT
TROCAR XCEL NON-BLD 11X100MML (ENDOMECHANICALS) ×6 IMPLANT
TUBE SUCT INTRACARD DLP 20F (MISCELLANEOUS) ×3 IMPLANT
TUNNELER SHEATH ON-Q 11GX8 DSP (PAIN MANAGEMENT) ×3 IMPLANT
UNDERPAD 30X30 (UNDERPADS AND DIAPERS) ×3 IMPLANT
VALVE MAGNA MITRAL 31MM (Prosthesis & Implant Heart) ×3 IMPLANT
WATER STERILE IRR 1000ML POUR (IV SOLUTION) ×6 IMPLANT
WIRE EMERALD 3MM-J .035X150CM (WIRE) ×3 IMPLANT

## 2018-12-13 NOTE — Anesthesia Procedure Notes (Signed)
Central Venous Catheter Insertion Performed by: Roderic Palau, MD, anesthesiologist Start/End11/18/2020 7:45 AM, 12/13/2018 8:00 AM Patient location: Pre-op. Preanesthetic checklist: patient identified, IV checked, site marked, risks and benefits discussed, surgical consent, monitors and equipment checked, pre-op evaluation, timeout performed and anesthesia consent Position: Trendelenburg Lidocaine 1% used for infiltration and patient sedated Hand hygiene performed , maximum sterile barriers used  and Seldinger technique used Catheter size: 9 Fr Total catheter length 10. Central line was placed.MAC introducer Swan type:thermodilution Procedure performed using ultrasound guided technique. Ultrasound Notes:anatomy identified, needle tip was noted to be adjacent to the nerve/plexus identified, no ultrasound evidence of intravascular and/or intraneural injection and image(s) printed for medical record Attempts: 1 Following insertion, line sutured, dressing applied and Biopatch. Post procedure assessment: blood return through all ports, free fluid flow and no air  Patient tolerated the procedure well with no immediate complications.

## 2018-12-13 NOTE — Op Note (Signed)
CARDIOTHORACIC SURGERY OPERATIVE NOTE  Date of Procedure:   12/13/2018  Preoperative Diagnosis:    Severe Mitral Stenosis  Recurrent Persistent Atrial Fibrillation  Postoperative Diagnosis: Same  Procedure:    Minimally-Invasive Mitral Valve Replacement  [facility] Mitral Bovine bioprosthetic tissue valve (size 31mm, model #7300TFX, serial [id number])   Minimally-Invasive Maze Procedure  Complete bilateral atrial lesion set using cryothermy and bipolar radiofrequency ablation  Clipping of Left Atrial Appendage (Atricure Pro245 left atrial clip, size 45 mm)  Surgeon: [name]. 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  Mild aortic insufficiency  Normal left ventricular systolic function             BRIEF CLINICAL NOTE AND INDICATIONS FOR SURGERY  Patient is a 73 year old female with likely rheumatic heart disease including mitral stenosis, aortic insufficiency, and recurrent paroxysmal atrial fibrillation and atrial flutter who describes a long history of symptoms consistent with chronic diastolic congestive heart failure and was hospitalized following a brief syncopal event whoreturns to the office today for follow up consultation visit regarding themanagement of severe symptomatic mitral stenosis and recurrent paroxysmal atrial fibrillation. I recently had the opportunity to evaluation the patient in consultation on October 18, 2018 while she was hospitalized following a brief syncopal event. No definite cause of syncope was identified although it was felt highly likely related to the patient's medications, specifically her beta-blocker. According to the patient she had developed an episode of paroxysmal atrial fibrillation the day before and was instructed to take an extra dose of carvedilol. She took another extra dose of carvedilol the  following morning when she felt like she was still in atrial fibrillation, and it was subsequent to that event that she passed out. The patient was seen in consultation while in the hospital and returned to the office on 2 occasions regarding surgical options.  She has been counseled at length regarding the indications, risks and potential benefits of surgery.  All questions have been answered, and the patient provides full informed consent for the operation as described.   DETAILS OF THE OPERATIVE PROCEDURE  Preparation:  The patient is brought to the operating room on the above mentioned date and central monitoring was established by the anesthesia team including placement of Swan-Ganz catheter through the left internal jugular vein.  A radial arterial line is placed. The patient is placed in the supine position on the operating table.  Intravenous antibiotics are administered. General endotracheal anesthesia is induced uneventfully. The patient is initially intubated using a dual lumen endotracheal tube.  A Foley catheter is placed.  Baseline transesophageal echocardiogram was performed.  Findings were notable for classical rheumatic valvular heart disease with severe mitral stenosis, mild mitral regurgitation, and mild aortic insufficiency.  There was normal left ventricular size and systolic function.  There was severe left atrial enlargement.  There was normal right ventricular size and systolic function.  There was mild to moderate (2+) central tricuspid regurgitation.  The tricuspid annulus measured 2.5 cm.  No other abnormalities were noted.  A soft roll is placed behind the patient's left scapula and the neck gently extended and turned to the left.   The patient's right neck, chest, abdomen, both groins, and both lower extremities are prepared and draped in a sterile manner. A time out procedure is performed.   Percutaneous Vascular Access:  Percutaneous arterial and venous access were  obtained on the right  side.  Using ultrasound guidance the right common femoral vein was cannulated using the Seldinger technique and a pair of Perclose vascular closure devises were placed at opposing 30 degree angles in the femoral vein, after which time an 8 French sheath inserted.  The right common femoral artery was cannulated using a micropuncture wire and sheath.  A pair of Perclose vascular closure devices were placed at opposing 30 degree angles in the femoral artery, and a 8 French sheath inserted.  The right internal jugular vein was cannulated  using ultrasound guidance and an 8 French sheath inserted.     Surgical Approach:  A right miniature anterolateral thoracotomy incision is performed. The incision is placed just lateral to and superior to the right nipple. The pectoralis major muscle is retracted medially and completely preserved. The right pleural space is entered through the 3rd intercostal space. A soft tissue retractor is placed.  Two 11 mm ports are placed through separate stab incisions inferiorly. The right pleural space is insufflated continuously with carbon dioxide gas through the posterior port during the remainder of the operation.  A pledgeted sutures placed through the dome of the right hemidiaphragm and retracted inferiorly to facilitate exposure.  A longitudinal incision is made in the pericardium 3 cm anterior to the phrenic nerve and silk traction sutures are placed on either side of the incision for exposure.   Extracorporeal Cardiopulmonary Bypass and Myocardial Protection:   The patient was heparinized systemically.  The right common femoral vein is cannulated through the venous sheath and a guidewire advanced into the right atrium using TEE guidance.  The femoral vein cannulated using a 22 Fr long femoral venous cannula.  The right common femoral artery is cannulated through the arterial sheath and a guidewire advanced into the descending thoracic aorta using TEE  guidance.  Femoral artery is cannulated with a 18 French femoral arterial cannula.  The right internal jugular vein is cannulated through the venous sheath and a guidewire advanced into the right atrium.  The internal jugular vein is cannulated using a 14 French pediatric femoral venous cannula.   Adequate heparinization is verified.   The entire pre-bypass portion of the operation was notable for stable hemodynamics.  Cardiopulmonary bypass was begun.  Vacuum assist venous drainage is utilized. The incision in the pericardium is extended in both directions. Venous drainage and exposure are notably excellent.    Clipping of Left Atrial Appendage:  The left atrial appendage is obliterated using an Atricure left atrial appendage clip (Atriclip Pro245, size 2mm).  The clip is applied under thoracoscopic visualization posterior to the aorta and pulmonary artery through the oblique sinus.  The clip was applied prior to application of the aortic crossclamp, with transesophageal echocardiographic confirmation that the clip satisfactorily obliterates the appendage.   Myocardial Protection:  Attempts to place a retrograde cardioplegia cannula through the right atrium into the coronary sinus using transesophageal echocardiogram guidance are unsuccessful.  An antegrade cardioplegia cannula is placed in the ascending aorta.  The patient is cooled to 32C systemic temperature.  The aortic cross clamp is applied and cardioplegia is delivered in an antegrade fashion through the aortic root using modified [place] cold blood cardioplegia ([place] blood cardioplegia protocol).   The initial cardioplegic arrest is rapid with early diastolic arrest.  Myocardial protection was felt to be excellent.   Maze Procedure (left atrial lesion set):  Following placement of the aortic crossclamp and the administration of the initial arresting dose of cardioplegia, Waterston's groove is  dissected.  A left atriotomy incision  was performed through the interatrial groove and extended partially across the back wall of the left atrium after opening the oblique sinus inferiorly.  The mitral valve and floor of the left atrium are exposed using a self-retaining retractor.    The Atricure CryoICE nitrous oxide cryothermy system is utilized for all cryothermy ablation lesions using 3 minute duration.  The AtriCure Synergy bipolar radiofrequency ablation clamp is utilized for all bipolar radiofrequency ablation lesions.  The left atrial lesion set of the Cox maze IV procedure is performed.  A cryothermy lesion is placed along the endocardial surface of the left atrium from the caudad apex of the atriotomy incision across the posterior wall of the left atrium onto the posterior mitral annulus.  A mirror image lesion along the epicardial surface is then performed with the probe posterior to the left atrium, crossing over the coronary sinus.  Two lesions are then performed to create a box isolating all of the pulmonary veins from the remainder of the left atrium.  The first lesion is placed from the cephalad apex of the atriotomy incision across the dome of the left atrium to just anterior to the left sided pulmonary veins.  This lesion connects to the base of the left atrial appendage.  The second lesion completes the box from the caudad apex of the atriotomy incision across the back wall of the left atrium to connect with the previous lesion just anterior to the left sided pulmonary veins.     Mitral Valve Replacement:  The mitral valve was inspected and notable for classical rheumatic disease with moderate to severe mitral stenosis.  Subvalvular apparatus to both the anterior and posterior leaflets were preserved. The majority of the anterior leaflet of mitral valve is excised sharply. Portions of the free margin of the anterior leaflet are preserved with their associated chordae tendinae, split in the midline, and incorporated into the  valve replacement suture line laterally.  The posterior leaflet was split in the midline.   Mitral valve replacement is performed using interrupted 2-0 Ethibond horizontal mattress pledgeted sutures with pledgets in the supra-annular position.  An [facility] Mitral Bovine bioprosthetic tissue valve (size 64mm, model #7300TFX, serial [id number]) Is rinsed in saline per manufacture recommendations and implanted uneventfully. Care is taken to position the stent posts appropriately to straddle the left ventricular outflow tract and avoid left ventricular outflow tract obstruction.  All sutures were secured using a Cor-knot device.  The valve is tested with saline and appears to be functioning normally. There is no paravalvular leak. Rewarming is begun.     Maze Procedure (right atrial lesion set):  A small incision is made in the posterior wall of the right atrium immediately posterior to the fossa ovalis.  The incision is extended a short distance anteriorly along the lateral wall of the right atrium towards the acute margin.  The AtriCure Synergy bipolar radiofrequency ablation clamp is utilized to create a series of linear lesions in the right atrium.  The first lesion is performed from the posterolateral apex of the right atriotomy incision across the posterior right atrium in a cephalad direction onto the posterior superior vena cava.  This lesion is performed with one limb of the bipolar clamp along the endocardial surface of the right atrium and the other across the epicardial surface and into the left atrium to completely avoid the sinus node activation complex.  A second lesion is placed in the opposite direction from the  posterior apex of the right atriotomy incision along the posterolateral wall to reach the lateral aspect of the inferior vena cava. A third lesion is placed from anterior apex of the right atriotomy incision in an oblique orientation towards the acute margin of the heart, with care  to stay inferior and well away from the sinus node activation complex region.  The atriotomy was closed using a 2-layer closure of running 3-0 Prolene suture after placing a sump drain across the mitral valve to serve as a left ventricular vent.  One final dose of warm "reanimation dose" cardioplegia was administered through the aortic root.  The aortic cross clamp was removed after a total cross clamp time of 103 minutes.  A second small right atriotomy incision is made at the anterior end of the bipolar lesion close to the acute margin.  A fourth bipolar lesion is placed from the anterior apex of this atriotomy incision in an anterior direction to the tip of the right atrial appendage.   Finally, the cryotherapy probe is utilized to complete the right atrial lesion set by placing the probe along the endocardial surface of the right atrium from the anterior apex of the atriotomy incision to cross the acute margin and reach the tricuspid annulus at the 2:00 position. The second right atriotomy incision is closed with a 2 layer closure of running 4-0 Prolene suture.   Procedure Completion:  Epicardial pacing wires are fixed to the inferior wall of the right ventricule and to the right atrial appendage. The patient is rewarmed to 37C temperature. The left ventricular vent and antegrade cardioplegia cannula are removed. The patient is weaned and disconnected from cardiopulmonary bypass.  The patient's rhythm at separation from bypass was sinus.  The patient was weaned from bypass without any inotropic support. Total cardiopulmonary bypass time for the operation was 155 minutes.  Followup transesophageal echocardiogram performed after separation from bypass revealed a well-seated annuloplasty ring in the mitral position with a normal functioning mitral valve. There was no residual leak.  Left ventricular function was unchanged from preoperatively.  The femoral arterial and venous cannulas were removed and  all Perclose sutures secured.  Manual pressure was maintained while Protamine was administered.  The right internal jugular cannula was removed and manual pressure held on the neck and groin for 15 minutes.  Single lung ventilation was begun. The atriotomy closure was inspected for hemostasis. The pericardial sac was drained using a 28 French Bard drain placed through the anterior port incision.  The right pleural space is irrigated with saline solution and inspected for hemostasis.   A single lumen On-Q catheter is placed for postoperative analgesia.  The catheter is initially tunneled through the subcutaneous tissues to the posterior port incision and then passed through the chest wall and tunneled into the subpleural space to cover the 2nd through the 6th intercostal nerve roots posteriorly.  The catheter is flushed with 0.5% bupivacaine solution and ultimately connected to a continuous infusion pump.  The right pleural space was drained using a 28 French Bard drain placed through the posterior port incision. The miniature thoracotomy incision was closed in multiple layers in routine fashion. The right groin incision was inspected for hemostasis and closed in multiple layers in routine fashion.  The post-bypass portion of the operation was notable for stable rhythm and hemodynamics.  No blood products were administered during the operation.   Disposition:  The patient tolerated the procedure well.  By the end of the procedure the patient  Swan-Ganz catheter was not functioning.  The Swan-Ganz catheter was removed.  Attempts to replace second Swan-Ganz catheter by the anesthesia team were unsuccessful.  Ultimately a central venous catheter was placed through the left IJ introducing sheath to facilitate CVP monitoring.  There were no intraoperative complications.  All sponge and instrument counts were correct.  Needle count was incorrect.  A chest x-ray was performed and revealed no retained needles.   The patient was reintubated using a single lumen endotracheal tube and subsequently transported to the surgical intensive care unit in stable condition.     [name]. Roxy Manns MD 12/13/2018 2:24 PM

## 2018-12-13 NOTE — Anesthesia Preprocedure Evaluation (Addendum)
Anesthesia Evaluation  Patient identified by MRN, date of birth, ID band Patient awake    Reviewed: Allergy & Precautions, NPO status , Patient's Chart, lab work & pertinent test results  History of Anesthesia Complications Negative for: history of anesthetic complications  Airway Mallampati: III  TM Distance: >3 FB Neck ROM: Full    Dental  (+) Dental Advisory Given, Partial Upper,    Pulmonary shortness of breath, sleep apnea ,    breath sounds clear to auscultation       Cardiovascular hypertension, Pt. on medications +CHF  + dysrhythmias Atrial Fibrillation + Valvular Problems/Murmurs AI  Rhythm:Irregular Rate:Tachycardia  Left ventricular ejection fraction, by visual estimation, is 55 to 60%. The left ventricle has normal function. Normal left ventricular size. There is no left ventricular hypertrophy.  2. Global right ventricle has normal systolic function.The right ventricular size is normal. No increase in right ventricular wall thickness.  3. Pulmonary hypertension.  4. Left atrial size was severely dilated.  5. No LAA thrombus.  6. Right atrial size was normal.  7. The mitral valve is rheumatic. Mild mitral valve regurgitation. Moderate-severe mitral stenosis. moderate mitral valve stenosis.  8. Moderate stenosis by pressure half time 1.2cm squared and mean gradient (76mHg). PISA radius 1.2cm, with calculated area of 1.2 cm squared.     Valve leaflets are thin, pliable, no subvalvular thickening, however surrounding P1 (posterior leaflet) there is a calcified fixed nodule (MAC like appearance).  9. The tricuspid valve is normal in structure. Tricuspid valve regurgitation is mild. 10. The aortic valve is tricuspid Aortic valve regurgitation is mild by color flow Doppler. Mild aortic valve sclerosis without stenosis. 11. The pulmonic valve was normal in structure. Pulmonic valve regurgitation is not visualized by color flow  Doppler. 12. Moderately elevated pulmonary artery systolic pressure. 13. The inferior vena cava is normal in size with greater than 50% respiratory variability, suggesting right atrial pressure of 3 mmHg. 14. Consider stress echo with transmitral gradient measurements. 15. Her moderate to severely elevated pulmonary pressures may indicate along with AFIB and markedly dilated LA along with symptoms, that mitral valve replacement is indicated. 16. However pulmonary pressures are high (60-658mg). Although leaflets are thin, pliable and without subvalvular thickening, she does have a calcified density on P1 which would likely increase risk with balloon valvuloplasty. 17. Rheumatic moderate mitral stenosis.   Neuro/Psych  Headaches, PSYCHIATRIC DISORDERS Anxiety    GI/Hepatic Neg liver ROS, hiatal hernia, GERD  Medicated and Controlled,  Endo/Other  negative endocrine ROS  Renal/GU negative Renal ROS     Musculoskeletal  (+) Arthritis ,   Abdominal   Peds  Hematology negative hematology ROS (+)   Anesthesia Other Findings   Reproductive/Obstetrics                            Anesthesia Physical Anesthesia Plan  ASA: IV  Anesthesia Plan: General   Post-op Pain Management:    Induction: Intravenous  PONV Risk Score and Plan: 3 and Ondansetron and Dexamethasone  Airway Management Planned: Double Lumen EBT  Additional Equipment: Arterial line, CVP, PA Cath, TEE and Ultrasound Guidance Line Placement  Intra-op Plan:   Post-operative Plan: Possible Post-op intubation/ventilation  Informed Consent: I have reviewed the patients History and Physical, chart, labs and discussed the procedure including the risks, benefits and alternatives for the proposed anesthesia with the patient or authorized representative who has indicated his/her understanding and acceptance.     Dental  advisory given  Plan Discussed with: CRNA and Surgeon  Anesthesia Plan  Comments:         Anesthesia Quick Evaluation

## 2018-12-13 NOTE — Anesthesia Procedure Notes (Signed)
Procedure Name: Intubation Date/Time: 12/13/2018 3:49 PM Performed by: Griffin Dakin, CRNA Pre-anesthesia Checklist: Patient identified, Emergency Drugs available, Suction available and Patient being monitored Patient Re-evaluated:Patient Re-evaluated prior to induction Oxygen Delivery Method: Circle system utilized Preoxygenation: Pre-oxygenation with 100% oxygen Induction Type: Inhalational induction with existing ETT and IV induction Ventilation: Unable to mask ventilate Tube type: Oral Tube size: 8.0 mm Number of attempts: 1 Intubation method: exchange catheter. Placement Confirmation: ETT inserted through vocal cords under direct vision,  positive ETCO2 and breath sounds checked- equal and bilateral Secured at: 24 cm Tube secured with: Tape Dental Injury: Teeth and Oropharynx as per pre-operative assessment

## 2018-12-13 NOTE — Transfer of Care (Signed)
Immediate Anesthesia Transfer of Care Note  Patient: Mary Farrell  Procedure(s) Performed: MINIMALLY INVASIVE MITRAL VALVE (MV) REPLACEMENT using a Magna Mitral Ease 31 MM Valve. (Right Chest) MINIMALLY INVASIVE MAZE PROCEDURE using a 45 MM AtriClip. (N/A ) TRANSESOPHAGEAL ECHOCARDIOGRAM (TEE) (N/A )  Patient Location: ICU  Anesthesia Type:General  Level of Consciousness: Patient remains intubated per anesthesia plan  Airway & Oxygen Therapy: Patient remains intubated per anesthesia plan  Post-op Assessment: Report given to RN and Post -op Vital signs reviewed and stable  Post vital signs: Reviewed and stable  Last Vitals:  Vitals Value Taken Time  BP    Temp    Pulse    Resp    SpO2      Last Pain:  Vitals:   12/13/18 0738  TempSrc:   PainSc: 0-No pain      Patients Stated Pain Goal: 3 ([phone] [id number])  Complications: No apparent anesthesia complications

## 2018-12-13 NOTE — Brief Op Note (Signed)
12/13/2018  1:08 PM  PATIENT:  Mary Farrell  73 y.o. female  PRE-OPERATIVE DIAGNOSIS:  MITRAL STENOSIS ATRIAL FIBRILLATION  POST-OPERATIVE DIAGNOSIS:  MITRAL STENOSIS ATRIAL FIBRILLATION  PROCEDURE:  Procedure(s): MINIMALLY INVASIVE MITRAL VALVE (MV) REPLACEMENT (Right) MINIMALLY INVASIVE MAZE PROCEDURE (N/A) TRANSESOPHAGEAL ECHOCARDIOGRAM (TEE) (N/A)  SURGEON:  Surgeon(s) and Role:    Rexene Alberts, MD - Primary  PHYSICIAN ASSISTANT: WAYNE GOLD PA-C  ANESTHESIA:   general  EBL:  450 mL  BLOOD ADMINISTERED:none  DRAINS: LEFT PLEURAL AND PERICARDIAL CHEST TUBES   LOCAL MEDICATIONS USED:  OTHER ON-Q  SPECIMEN:  Source of Specimen:  MITRAL VALVE  DISPOSITION OF SPECIMEN:  PATHOLOGY  COUNTS:  YES  TOURNIQUET:  * No tourniquets in log *  DICTATION: .Dragon Dictation  PLAN OF CARE: Admit to inpatient   PATIENT DISPOSITION:  ICU - intubated and hemodynamically stable.   Delay start of Pharmacological VTE agent (>24hrs) due to surgical blood loss or risk of bleeding: yes

## 2018-12-13 NOTE — Progress Notes (Signed)
TCTS Evening Rounds  DOS from Mitral replacement/maze Remains intubated, hemodynamically stable but appearing underresuscitated INR 1.7; h/h 8.5/25.6 plt 104  A/p: transfuse 2 u PRBC, 2 FFP; one plt  Anticipate extubation 1st thing in am.  Mary Farrell Z. Orvan Seen, [place]

## 2018-12-13 NOTE — Anesthesia Procedure Notes (Signed)
Procedure Name: Intubation Date/Time: 12/13/2018 9:00 AM Performed by: Griffin Dakin, CRNA Pre-anesthesia Checklist: Patient identified, Emergency Drugs available, Suction available and Patient being monitored Patient Re-evaluated:Patient Re-evaluated prior to induction Oxygen Delivery Method: Circle system utilized Preoxygenation: Pre-oxygenation with 100% oxygen Induction Type: IV induction Ventilation: Mask ventilation without difficulty and Oral airway inserted - appropriate to patient size Laryngoscope Size: Sabra Heck and 2 Grade View: Grade I Tube type: Oral Endobronchial tube: Left and Double lumen EBT and 35 Fr Number of attempts: 2 Airway Equipment and Method: Rigid stylet Placement Confirmation: ETT inserted through vocal cords under direct vision,  positive ETCO2 and breath sounds checked- equal and bilateral Secured at: 27 cm Tube secured with: Tape Dental Injury: Teeth and Oropharynx as per pre-operative assessment  Comments: Unsuccessful attempt with MAC 3, second attempt successful with Sabra Heck 2 by Dr. Ermalene Postin.

## 2018-12-13 NOTE — Anesthesia Procedure Notes (Signed)
Arterial Line Insertion Start/End11/18/2020 8:00 AM, 12/13/2018 8:20 AM Performed by: Leonor Liv, CRNA, CRNA  Patient location: Pre-op. Preanesthetic checklist: patient identified, IV checked, site marked, risks and benefits discussed, surgical consent, monitors and equipment checked, pre-op evaluation, timeout performed and anesthesia consent Lidocaine 1% used for infiltration and patient sedated Left, radial was placed Catheter size: 20 G Hand hygiene performed  and maximum sterile barriers used  Allen's test indicative of satisfactory collateral circulation Attempts: 4 (first 2 attempts by Otho Najjar CRNA) Procedure performed without using ultrasound guided technique. Following insertion, dressing applied and Biopatch. Post procedure assessment: normal  Patient tolerated the procedure well with no immediate complications.

## 2018-12-13 NOTE — Progress Notes (Signed)
  Echocardiogram Echocardiogram Transesophageal has been performed.  Mary Farrell 12/13/2018, 10:16 AM

## 2018-12-13 NOTE — Interval H&P Note (Signed)
History and Physical Interval Note:  12/13/2018 7:52 AM  Mary Farrell  has presented today for surgery, with the diagnosis of MS AFIB.  The various methods of treatment have been discussed with the patient and family. After consideration of risks, benefits and other options for treatment, the patient has consented to  Procedure(s): MINIMALLY INVASIVE MITRAL VALVE (MV) REPLACEMENT (Right) MINIMALLY INVASIVE MAZE PROCEDURE (N/A) TRANSESOPHAGEAL ECHOCARDIOGRAM (TEE) (N/A) as a surgical intervention.  The patient's history has been reviewed, patient examined, no change in status, stable for surgery.  I have reviewed the patient's chart and labs.  Questions were answered to the patient's satisfaction.     Rexene Alberts

## 2018-12-13 NOTE — Anesthesia Procedure Notes (Signed)
Central Venous Catheter Insertion Performed by: Roderic Palau, MD, anesthesiologist Start/End11/18/2020 7:45 AM, 12/13/2018 8:00 AM Patient location: Pre-op. Preanesthetic checklist: patient identified, IV checked, site marked, risks and benefits discussed, surgical consent, monitors and equipment checked, pre-op evaluation, timeout performed and anesthesia consent Hand hygiene performed  and maximum sterile barriers used  PA cath was placed.Swan type:thermodilution PA Cath depth:40 Procedure performed without using ultrasound guided technique. Attempts: 1 Patient tolerated the procedure well with no immediate complications.

## 2018-12-13 NOTE — Progress Notes (Addendum)
Between 6-7pm, pt dumped 149mL from chest tubes. Dr. Orvan Seen to bedside & Dr. Roxy Manns was called. Dr. Roxy Manns said to monitor pt for the next hour & proceed as sees fit. Between 7-8pm, pt only had 173mL of chest tube output. This RN noticed by 8:30pm, 172mL of chest tube output had already dumped into the [facility]. STAT CBC & PT/INR sent to lab. Awaiting results to call on-call surgeon for possible new orders.  Pt being paced @94bpm . BP 121/56 (79). CVP 6-8. Flotrack numbers have not deviated or dropped. Pt currently RAAS -1 on some precedex. Will continue to monitor & call MD will lab results.    Lab results: Hgb 8.5 Hct 25.6  Will continue to monitor pt as her vital signs continue to remain stable. Will not attempt to extubate until CT output decreases.

## 2018-12-14 ENCOUNTER — Inpatient Hospital Stay (HOSPITAL_COMMUNITY): Payer: Medicare Other

## 2018-12-14 ENCOUNTER — Encounter (HOSPITAL_COMMUNITY): Payer: Self-pay | Admitting: Thoracic Surgery (Cardiothoracic Vascular Surgery)

## 2018-12-14 LAB — POCT I-STAT 7, (LYTES, BLD GAS, ICA,H+H)
Acid-Base Excess: 4 mmol/L — ABNORMAL HIGH (ref 0.0–2.0)
Acid-Base Excess: 2 mmol/L (ref 0.0–2.0)
Acid-Base Excess: 3 mmol/L — ABNORMAL HIGH (ref 0.0–2.0)
Acid-base deficit: 3 mmol/L — ABNORMAL HIGH (ref 0.0–2.0)
Bicarbonate: 30.5 mmol/L — ABNORMAL HIGH (ref 20.0–28.0)
Bicarbonate: 25.9 mmol/L (ref 20.0–28.0)
Bicarbonate: 26.2 mmol/L (ref 20.0–28.0)
Bicarbonate: 22 mmol/L (ref 20.0–28.0)
Calcium, Ion: 1.21 mmol/L (ref 1.15–1.40)
Calcium, Ion: 0.88 mmol/L — CL (ref 1.15–1.40)
Calcium, Ion: 0.95 mmol/L — ABNORMAL LOW (ref 1.15–1.40)
Calcium, Ion: 0.98 mmol/L — ABNORMAL LOW (ref 1.15–1.40)
HCT: 36 % (ref 36.0–46.0)
HCT: 28 % — ABNORMAL LOW (ref 36.0–46.0)
HCT: 24 % — ABNORMAL LOW (ref 36.0–46.0)
HCT: 28 % — ABNORMAL LOW (ref 36.0–46.0)
Hemoglobin: 12.2 g/dL (ref 12.0–15.0)
Hemoglobin: 9.5 g/dL — ABNORMAL LOW (ref 12.0–15.0)
Hemoglobin: 8.2 g/dL — ABNORMAL LOW (ref 12.0–15.0)
Hemoglobin: 9.5 g/dL — ABNORMAL LOW (ref 12.0–15.0)
O2 Saturation: 100 %
O2 Saturation: 100 %
O2 Saturation: 100 %
O2 Saturation: 99 %
Patient temperature: 99.8
Potassium: 3.3 mmol/L — ABNORMAL LOW (ref 3.5–5.1)
Potassium: 3.1 mmol/L — ABNORMAL LOW (ref 3.5–5.1)
Potassium: 3.3 mmol/L — ABNORMAL LOW (ref 3.5–5.1)
Potassium: 3.6 mmol/L (ref 3.5–5.1)
Sodium: 140 mmol/L (ref 135–145)
Sodium: 145 mmol/L (ref 135–145)
Sodium: 142 mmol/L (ref 135–145)
Sodium: 145 mmol/L (ref 135–145)
TCO2: 32 mmol/L (ref 22–32)
TCO2: 27 mmol/L (ref 22–32)
TCO2: 27 mmol/L (ref 22–32)
TCO2: 23 mmol/L (ref 22–32)
pCO2 arterial: 50.7 mmHg — ABNORMAL HIGH (ref 32.0–48.0)
pCO2 arterial: 38.3 mmHg (ref 32.0–48.0)
pCO2 arterial: 33.9 mmHg (ref 32.0–48.0)
pCO2 arterial: 38.1 mmHg (ref 32.0–48.0)
pH, Arterial: 7.387 (ref 7.350–7.450)
pH, Arterial: 7.438 (ref 7.350–7.450)
pH, Arterial: 7.495 — ABNORMAL HIGH (ref 7.350–7.450)
pH, Arterial: 7.372 (ref 7.350–7.450)
pO2, Arterial: 527 mmHg — ABNORMAL HIGH (ref 83.0–108.0)
pO2, Arterial: 363 mmHg — ABNORMAL HIGH (ref 83.0–108.0)
pO2, Arterial: 208 mmHg — ABNORMAL HIGH (ref 83.0–108.0)
pO2, Arterial: 165 mmHg — ABNORMAL HIGH (ref 83.0–108.0)

## 2018-12-14 LAB — POCT I-STAT, CHEM 8
BUN: 6 mg/dL — ABNORMAL LOW (ref 8–23)
BUN: 4 mg/dL — ABNORMAL LOW (ref 8–23)
BUN: 5 mg/dL — ABNORMAL LOW (ref 8–23)
BUN: 5 mg/dL — ABNORMAL LOW (ref 8–23)
BUN: 4 mg/dL — ABNORMAL LOW (ref 8–23)
BUN: 5 mg/dL — ABNORMAL LOW (ref 8–23)
Calcium, Ion: 1.19 mmol/L (ref 1.15–1.40)
Calcium, Ion: 0.85 mmol/L — CL (ref 1.15–1.40)
Calcium, Ion: 1.16 mmol/L (ref 1.15–1.40)
Calcium, Ion: 0.91 mmol/L — ABNORMAL LOW (ref 1.15–1.40)
Calcium, Ion: 0.93 mmol/L — ABNORMAL LOW (ref 1.15–1.40)
Calcium, Ion: 0.94 mmol/L — ABNORMAL LOW (ref 1.15–1.40)
Chloride: 99 mmol/L (ref 98–111)
Chloride: 103 mmol/L (ref 98–111)
Chloride: 97 mmol/L — ABNORMAL LOW (ref 98–111)
Chloride: 101 mmol/L (ref 98–111)
Chloride: 105 mmol/L (ref 98–111)
Chloride: 103 mmol/L (ref 98–111)
Creatinine, Ser: 0.5 mg/dL (ref 0.44–1.00)
Creatinine, Ser: 0.4 mg/dL — ABNORMAL LOW (ref 0.44–1.00)
Creatinine, Ser: 0.4 mg/dL — ABNORMAL LOW (ref 0.44–1.00)
Creatinine, Ser: 0.4 mg/dL — ABNORMAL LOW (ref 0.44–1.00)
Creatinine, Ser: 0.4 mg/dL — ABNORMAL LOW (ref 0.44–1.00)
Creatinine, Ser: 0.5 mg/dL (ref 0.44–1.00)
Glucose, Bld: 100 mg/dL — ABNORMAL HIGH (ref 70–99)
Glucose, Bld: 104 mg/dL — ABNORMAL HIGH (ref 70–99)
Glucose, Bld: 122 mg/dL — ABNORMAL HIGH (ref 70–99)
Glucose, Bld: 109 mg/dL — ABNORMAL HIGH (ref 70–99)
Glucose, Bld: 127 mg/dL — ABNORMAL HIGH (ref 70–99)
Glucose, Bld: 121 mg/dL — ABNORMAL HIGH (ref 70–99)
HCT: 36 % (ref 36.0–46.0)
HCT: 28 % — ABNORMAL LOW (ref 36.0–46.0)
HCT: 37 % (ref 36.0–46.0)
HCT: 26 % — ABNORMAL LOW (ref 36.0–46.0)
HCT: 26 % — ABNORMAL LOW (ref 36.0–46.0)
HCT: 26 % — ABNORMAL LOW (ref 36.0–46.0)
Hemoglobin: 12.2 g/dL (ref 12.0–15.0)
Hemoglobin: 12.6 g/dL (ref 12.0–15.0)
Hemoglobin: 9.5 g/dL — ABNORMAL LOW (ref 12.0–15.0)
Hemoglobin: 8.8 g/dL — ABNORMAL LOW (ref 12.0–15.0)
Hemoglobin: 8.8 g/dL — ABNORMAL LOW (ref 12.0–15.0)
Hemoglobin: 8.8 g/dL — ABNORMAL LOW (ref 12.0–15.0)
Potassium: 3.3 mmol/L — ABNORMAL LOW (ref 3.5–5.1)
Potassium: 2.9 mmol/L — ABNORMAL LOW (ref 3.5–5.1)
Potassium: 3.1 mmol/L — ABNORMAL LOW (ref 3.5–5.1)
Potassium: 3.8 mmol/L (ref 3.5–5.1)
Potassium: 3.9 mmol/L (ref 3.5–5.1)
Potassium: 3.4 mmol/L — ABNORMAL LOW (ref 3.5–5.1)
Sodium: 140 mmol/L (ref 135–145)
Sodium: 140 mmol/L (ref 135–145)
Sodium: 145 mmol/L (ref 135–145)
Sodium: 144 mmol/L (ref 135–145)
Sodium: 144 mmol/L (ref 135–145)
Sodium: 144 mmol/L (ref 135–145)
TCO2: 30 mmol/L (ref 22–32)
TCO2: 26 mmol/L (ref 22–32)
TCO2: 27 mmol/L (ref 22–32)
TCO2: 27 mmol/L (ref 22–32)
TCO2: 26 mmol/L (ref 22–32)
TCO2: 26 mmol/L (ref 22–32)

## 2018-12-14 LAB — BASIC METABOLIC PANEL
Anion gap: 9 (ref 5–15)
Anion gap: 9 (ref 5–15)
BUN: 9 mg/dL (ref 8–23)
BUN: 10 mg/dL (ref 8–23)
CO2: 23 mmol/L (ref 22–32)
CO2: 24 mmol/L (ref 22–32)
Calcium: 6.8 mg/dL — ABNORMAL LOW (ref 8.9–10.3)
Calcium: 6.7 mg/dL — ABNORMAL LOW (ref 8.9–10.3)
Chloride: 111 mmol/L (ref 98–111)
Chloride: 107 mmol/L (ref 98–111)
Creatinine, Ser: 0.61 mg/dL (ref 0.44–1.00)
Creatinine, Ser: 0.67 mg/dL (ref 0.44–1.00)
GFR calc Af Amer: >60 mL/min (ref >60)
GFR calc Af Amer: >60 mL/min (ref >60)
GFR calc non Af Amer: >60 mL/min (ref >60)
GFR calc non Af Amer: >60 mL/min (ref >60)
Glucose, Bld: 124 mg/dL — ABNORMAL HIGH (ref 70–99)
Glucose, Bld: 111 mg/dL — ABNORMAL HIGH (ref 70–99)
Potassium: 3.3 mmol/L — ABNORMAL LOW (ref 3.5–5.1)
Potassium: 4.1 mmol/L (ref 3.5–5.1)
Sodium: 143 mmol/L (ref 135–145)
Sodium: 140 mmol/L (ref 135–145)

## 2018-12-14 LAB — CBC
HCT: 29.9 % — ABNORMAL LOW (ref 36.0–46.0)
HCT: 30.1 % — ABNORMAL LOW (ref 36.0–46.0)
Hemoglobin: 9.9 g/dL — ABNORMAL LOW (ref 12.0–15.0)
Hemoglobin: 9.7 g/dL — ABNORMAL LOW (ref 12.0–15.0)
MCH: 30.8 pg (ref 26.0–34.0)
MCH: 31 pg (ref 26.0–34.0)
MCHC: 33.1 g/dL (ref 30.0–36.0)
MCHC: 32.2 g/dL (ref 30.0–36.0)
MCV: 93.1 fL (ref 80.0–100.0)
MCV: 96.2 fL (ref 80.0–100.0)
Platelets: 103 10*3/uL — ABNORMAL LOW (ref 150–400)
Platelets: 115 10*3/uL — ABNORMAL LOW (ref 150–400)
RBC: 3.21 MIL/uL — ABNORMAL LOW (ref 3.87–5.11)
RBC: 3.13 MIL/uL — ABNORMAL LOW (ref 3.87–5.11)
RDW: 14.6 % (ref 11.5–15.5)
RDW: 15.5 % (ref 11.5–15.5)
WBC: 8.4 10*3/uL (ref 4.0–10.5)
WBC: 11.5 10*3/uL — ABNORMAL HIGH (ref 4.0–10.5)
nRBC: 0 % (ref 0.0–0.2)
nRBC: 0 % (ref 0.0–0.2)

## 2018-12-14 LAB — TYPE AND SCREEN
ABO/RH(D): A NEG
Antibody Screen: NEGATIVE
Unit division: 0
Unit division: 0

## 2018-12-14 LAB — PREPARE FRESH FROZEN PLASMA
Unit division: 0
Unit division: 0

## 2018-12-14 LAB — BPAM FFP
Blood Product Expiration Date: 202011222359
Blood Product Expiration Date: 202011222359
ISSUE DATE / TIME: 202011182200
ISSUE DATE / TIME: 202011182200
Unit Type and Rh: 6200
Unit Type and Rh: 6200

## 2018-12-14 LAB — BPAM RBC
Blood Product Expiration Date: 202012172359
Blood Product Expiration Date: 202012172359
ISSUE DATE / TIME: 202011182157
ISSUE DATE / TIME: 202011182157
Unit Type and Rh: 600
Unit Type and Rh: 600

## 2018-12-14 LAB — PREPARE PLATELET PHERESIS
Unit division: 0
Unit division: 0

## 2018-12-14 LAB — BPAM PLATELET PHERESIS
Blood Product Expiration Date: 202011192359
Blood Product Expiration Date: 202011202359
ISSUE DATE / TIME: 202011182200
Unit Type and Rh: 5100
Unit Type and Rh: 5100

## 2018-12-14 LAB — GLUCOSE, CAPILLARY
Glucose-Capillary: 110 mg/dL — ABNORMAL HIGH (ref 70–99)
Glucose-Capillary: 99 mg/dL (ref 70–99)
Glucose-Capillary: 114 mg/dL — ABNORMAL HIGH (ref 70–99)
Glucose-Capillary: 111 mg/dL — ABNORMAL HIGH (ref 70–99)

## 2018-12-14 LAB — MAGNESIUM
Magnesium: 3 mg/dL — ABNORMAL HIGH (ref 1.7–2.4)
Magnesium: 2.7 mg/dL — ABNORMAL HIGH (ref 1.7–2.4)

## 2018-12-14 MED ORDER — INSULIN ASPART 100 UNIT/ML ~~LOC~~ SOLN: 0.0000 [IU] | SUBCUTANEOUS | Status: DC

## 2018-12-14 MED ORDER — DOXEPIN HCL 10 MG PO CAPS
10.0000 mg | ORAL_CAPSULE | Freq: Every evening | ORAL | Status: DC | PRN
  Administered 2018-12-16 – 2018-12-18 (×3): 20 mg via ORAL
  Filled 2018-12-14 (×6): qty 2

## 2018-12-14 MED ORDER — LATANOPROST 0.005 % OP SOLN
1.0000 [drp] | Freq: Every day | OPHTHALMIC | Status: DC
  Administered 2018-12-14 – 2018-12-19 (×6): 1 [drp] via OPHTHALMIC
  Filled 2018-12-14 (×3): qty 2.5

## 2018-12-14 MED ORDER — ENOXAPARIN SODIUM 30 MG/0.3ML ~~LOC~~ SOLN
30.0000 mg | Freq: Every day | SUBCUTANEOUS | Status: DC
  Administered 2018-12-15: 30 mg via SUBCUTANEOUS
  Filled 2018-12-14: qty 0.3

## 2018-12-14 MED ORDER — CHLORHEXIDINE GLUCONATE 0.12% ORAL RINSE (MEDLINE KIT)
15.0000 mL | Freq: Two times a day (BID) | OROMUCOSAL | Status: DC
  Administered 2018-12-14 – 2018-12-20 (×10): 15 mL via OROMUCOSAL

## 2018-12-14 MED ORDER — WARFARIN SODIUM 2.5 MG PO TABS
2.5000 mg | ORAL_TABLET | Freq: Every day | ORAL | Status: DC
  Administered 2018-12-14 – 2018-12-18 (×5): 2.5 mg via ORAL
  Filled 2018-12-14 (×5): qty 1

## 2018-12-14 MED ORDER — DORZOLAMIDE HCL-TIMOLOL MAL 2-0.5 % OP SOLN
1.0000 [drp] | Freq: Every day | OPHTHALMIC | Status: DC
  Administered 2018-12-14 – 2018-12-19 (×6): 1 [drp] via OPHTHALMIC
  Filled 2018-12-14: qty 10

## 2018-12-14 MED ORDER — ORAL CARE MOUTH RINSE
15.0000 mL | OROMUCOSAL | Status: DC
  Administered 2018-12-14 (×3): 15 mL via OROMUCOSAL

## 2018-12-14 MED ORDER — COUMADIN BOOK
Freq: Once | Status: DC
  Filled 2018-12-14: qty 1

## 2018-12-14 MED ORDER — FUROSEMIDE 10 MG/ML IJ SOLN
20.0000 mg | Freq: Two times a day (BID) | INTRAMUSCULAR | Status: DC
  Administered 2018-12-14 (×2): 20 mg via INTRAVENOUS
  Filled 2018-12-14 (×3): qty 2

## 2018-12-14 MED ORDER — GABAPENTIN 300 MG PO CAPS
300.0000 mg | ORAL_CAPSULE | Freq: Three times a day (TID) | ORAL | Status: DC
  Administered 2018-12-14 – 2018-12-20 (×19): 300 mg via ORAL
  Filled 2018-12-14: qty 1
  Filled 2018-12-14 (×3): qty 3
  Filled 2018-12-14 (×4): qty 1
  Filled 2018-12-14 (×2): qty 3
  Filled 2018-12-14 (×9): qty 1

## 2018-12-14 MED ORDER — WARFARIN - PHYSICIAN DOSING INPATIENT
Freq: Every day | Status: DC
  Administered 2018-12-15: 19:00:00
  Administered 2018-12-16 – 2018-12-17 (×2): 1
  Administered 2018-12-18: 18:00:00

## 2018-12-14 MED ORDER — ASPIRIN EC 325 MG PO TBEC
325.0000 mg | DELAYED_RELEASE_TABLET | Freq: Every day | ORAL | Status: AC
  Administered 2018-12-14: 09:00:00 325 mg via ORAL
  Filled 2018-12-14: qty 1

## 2018-12-14 MED ORDER — POTASSIUM CHLORIDE 10 MEQ/50ML IV SOLN
10.0000 meq | INTRAVENOUS | Status: AC
  Administered 2018-12-14 (×3): 10 meq via INTRAVENOUS
  Filled 2018-12-14 (×3): qty 50

## 2018-12-14 MED ORDER — ALPRAZOLAM 0.25 MG PO TABS
0.1250 mg | ORAL_TABLET | Freq: Two times a day (BID) | ORAL | Status: DC | PRN
  Administered 2018-12-15 – 2018-12-19 (×2): 0.125 mg via ORAL
  Filled 2018-12-14 (×2): qty 1

## 2018-12-14 MED ORDER — POTASSIUM CHLORIDE 10 MEQ/50ML IV SOLN
10.0000 meq | INTRAVENOUS | Status: DC
  Administered 2018-12-14 (×3): 10 meq via INTRAVENOUS
  Filled 2018-12-14 (×3): qty 50

## 2018-12-14 NOTE — Plan of Care (Signed)

## 2018-12-14 NOTE — Discharge Instructions (Addendum)
Mitral Valve Replacement, Care After This sheet gives you information about how to care for yourself after your procedure. Your health care provider may also give you more specific instructions. If you have problems or questions, contact your health care provider. What can I expect after the procedure? After the procedure, it is common to have pain at the incision area. This may last for several weeks. Follow these instructions at home: Incision care   Follow instructions from your health care provider about how to take care of your incision. Make sure you: ? Wash your hands with soap and water before and after you change your bandage (dressing). If soap and water are not available, use hand sanitizer. ? Change your dressing as told by your health care provider. ? Leave stitches (sutures), skin glue, or adhesive strips in place. These skin closures may need to stay in place for 2 weeks or longer. If adhesive strip edges start to loosen and curl up, you may trim the loose edges. Do not remove adhesive strips completely unless your health care provider tells you to do that.  Check your incision area every day for signs of infection. Check for: ? Redness, swelling, or pain. ? Fluid or blood. ? Warmth. ? Pus or a bad smell.  Do not apply powder or lotion to the area. Bathing  Do not take baths, swim, or use a hot tub until your health care provider approves. You may shower.  To wash the incision site, gently wash with soap and water and pat the area dry with a clean towel. Do not rub the incision area. That may cause bleeding. Activity  Rest as told by your health care provider.  Avoid sitting for a long time without moving, and avoid crossing your legs. Get up to take short walks every 1-2 hours. This is important to improve blood flow and breathing. Ask for help if you feel weak or unsteady.  Return to your normal activities as told by your health care provider. Ask your health care  provider what activities are safe for you.  Avoid the following activities for 6-8 weeks, or as long as directed: ? Lifting anything that is heavier than 10 lb (4.5 kg), or the limit that you are told. ? Pushing or pulling things with your arms.  Avoid climbing stairs and using the handrail to pull yourself up for the first 2-3 weeks after surgery.  Avoid airplane travel for 4-6 weeks, or as long as directed.  If you are taking blood thinners (anticoagulants), avoid activities that have a high risk of injury. Ask your health care provider what activities are safe for you. Medicines  Take over-the-counter and prescription medicines only as told by your health care provider.  If you are taking blood thinners: ? Talk with your health care provider before you take any medicines that contain aspirin or NSAIDs. These medicines increase your risk for dangerous bleeding. ? Take your medicine exactly as told, at the same time every day. ? Avoid activities that could cause injury or bruising. ? Follow instructions about how to prevent falls. ? Wear a medical alert bracelet or carry a card that lists what medicines you take.  Ask your health care provider if the medicine prescribed to you: ? Requires you to avoid driving or using heavy machinery. ? Can cause constipation. You may need to take actions to prevent or treat constipation, such as:  Drink enough fluid to keep your urine pale yellow.  Take over-the-counter or  prescription medicines.  Eat foods that are high in fiber, such as beans, whole grains, and fresh fruits and vegetables.  Limit foods that are high in fat and processed sugars, such as fried or sweet foods. General instructions   Take your temperature every day and weigh yourself every morning for the first 7 days after surgery. Write your temperatures and weight down and take this record with you to any follow-up visits.  Wear compression stockings as told by your health  care provider. These stockings may help to prevent blood clots and reduce swelling in your legs. You may be asked to wear these stockings for at least 2 weeks after your procedure. If your ankles are swollen after 2 weeks, contact your health care provider to see if you should continue to wear the stockings.  Follow instructions from your health care provider about eating or drinking restrictions.  Do not drink alcohol until your health care provider approves.  Do not drive until your health care provider approves.  Do not use any products that contain nicotine or tobacco, such as cigarettes, e-cigarettes, and chewing tobacco. If you need help quitting, ask your health care provider.  Keep all follow-up visits as told by your health care provider. This is important. Contact a health care provider if:  You develop a skin rash.  Your weight is increasing each day over 2-3 days.  You gain 2 lb (1 kg) or more in a single day. Get help right away if:  You develop chest pain that feels different from the pain caused by your incision.  You develop shortness of breath or difficulty breathing.  You have a fever.  You have redness, swelling, or pain around your incision.  You have fluid or blood coming from your incision.  Your incision feels warm to the touch.  You have pus or a bad smell coming from your incision.  You feel light-headed. Summary  After the procedure, it is common to have pain at the incision area. This may last for several weeks.  Take your temperature every day and weigh yourself every morning for the first 7 days after surgery.  Check your incision area every day for signs of infection.  Keep all follow-up visits as told by your health care provider. This is important. This information is not intended to replace advice given to you by your health care provider. Make sure you discuss any questions you have with your health care provider. Document Released:  07/31/2004 Document Revised: 10/04/2017 Document Reviewed: 10/04/2017 Elsevier Patient Education  2020 [place] on my medicine - Coumadin   (Warfarin)  Why was Coumadin prescribed for you? Coumadin was prescribed for you because you have a blood clot or a medical condition that can cause an increased risk of forming blood clots. Blood clots can cause serious health problems by blocking the flow of blood to the heart, lung, or brain. Coumadin can prevent harmful blood clots from forming. As a reminder your indication for Coumadin is:  Atrial fibrillation and mitral valve replacement  What test will check on my response to Coumadin? While on Coumadin (warfarin) you will need to have an INR test regularly to ensure that your dose is keeping you in the desired range. The INR (international normalized ratio) number is calculated from the result of the laboratory test called prothrombin time (PT).  If an INR APPOINTMENT HAS NOT ALREADY BEEN MADE FOR YOU please schedule an appointment to have this lab work done by  your health care provider within 7 days. Your INR goal is usually a number between:  2 to 3 or your provider may give you a more narrow range like 2-2.5.  Ask your health care provider during an office visit what your goal INR is.  What  do you need to  know  About  COUMADIN? Take Coumadin (warfarin) exactly as prescribed by your healthcare provider about the same time each day.  DO NOT stop taking without talking to the doctor who prescribed the medication.  Stopping without other blood clot prevention medication to take the place of Coumadin may increase your risk of developing a new clot or stroke.  Get refills before you run out.  What do you do if you miss a dose? If you miss a dose, take it as soon as you remember on the same day then continue your regularly scheduled regimen the next day.  Do not take two doses of Coumadin at the same time.  Important Safety  Information A possible side effect of Coumadin (Warfarin) is an increased risk of bleeding. You should call your healthcare provider right away if you experience any of the following: ? Bleeding from an injury or your nose that does not stop. ? Unusual colored urine (red or dark brown) or unusual colored stools (red or black). ? Unusual bruising for unknown reasons. ? A serious fall or if you hit your head (even if there is no bleeding).  Some foods or medicines interact with Coumadin (warfarin) and might alter your response to warfarin. To help avoid this: ? Eat a balanced diet, maintaining a consistent amount of Vitamin K. ? Notify your provider about major diet changes you plan to make. ? Avoid alcohol or limit your intake to 1 drink for women and 2 drinks for men per day. (1 drink is 5 oz. wine, 12 oz. beer, or 1.5 oz. liquor.)  Make sure that ANY health care provider who prescribes medication for you knows that you are taking Coumadin (warfarin).  Also make sure the healthcare provider who is monitoring your Coumadin knows when you have started a new medication including herbals and non-prescription products.  Coumadin (Warfarin)  Major Drug Interactions  Increased Warfarin Effect Decreased Warfarin Effect  Alcohol (large quantities) Antibiotics (esp. Septra/Bactrim, Flagyl, Cipro) Amiodarone (Cordarone) Aspirin (ASA) Cimetidine (Tagamet) Megestrol (Megace) NSAIDs (ibuprofen, naproxen, etc.) Piroxicam (Feldene) Propafenone (Rythmol SR) Propranolol (Inderal) Isoniazid (INH) Posaconazole (Noxafil) Barbiturates (Phenobarbital) Carbamazepine (Tegretol) Chlordiazepoxide (Librium) Cholestyramine (Questran) Griseofulvin Oral Contraceptives Rifampin Sucralfate (Carafate) Vitamin K   Coumadin (Warfarin) Major Herbal Interactions  Increased Warfarin Effect Decreased Warfarin Effect  Garlic Ginseng Ginkgo biloba Coenzyme Q10 Green tea St. Johns wort    Coumadin (Warfarin)  FOOD Interactions  Eat a consistent number of servings per week of foods HIGH in Vitamin K (1 serving =  cup)  Collards (cooked, or boiled & drained) Kale (cooked, or boiled & drained) Mustard greens (cooked, or boiled & drained) Parsley *serving size only =  cup Spinach (cooked, or boiled & drained) Swiss chard (cooked, or boiled & drained) Turnip greens (cooked, or boiled & drained)  Eat a consistent number of servings per week of foods MEDIUM-HIGH in Vitamin K (1 serving = 1 cup)  Asparagus (cooked, or boiled & drained) Broccoli (cooked, boiled & drained, or raw & chopped) Brussel sprouts (cooked, or boiled & drained) *serving size only =  cup Lettuce, raw (green leaf, endive, romaine) Spinach, raw Turnip greens, raw & chopped   These websites  have more information on Coumadin (warfarin):  FailFactory.se; VeganReport.com.au;

## 2018-12-14 NOTE — Progress Notes (Signed)
EVENING ROUNDS NOTE :     [address]       [place] [address]             [phone]                 1 Day Post-Op Procedure(s) (LRB): MINIMALLY INVASIVE MITRAL VALVE (MV) REPLACEMENT using a Magna Mitral Ease 31 MM Valve. (Right) MINIMALLY INVASIVE MAZE PROCEDURE using a 45 MM AtriClip. (N/A) TRANSESOPHAGEAL ECHOCARDIOGRAM (TEE) (N/A)   Total Length of Stay:  LOS: 1 day  Events:  Doing well Up to chair. Ambulating     BP (!) 140/57   Pulse 80   Temp 98.6 F (37 C) (Oral)   Resp (!) 23   Ht 5' (1.524 m)   Wt 63.3 kg Comment: pt still intubated- bed weight w/ nothing on bed w/ pt  SpO2 99%   BMI 27.25 kg/m   CVP:  [3 mmHg-9 mmHg] 8 mmHg  Vent Mode: PSV;CPAP FiO2 (%):  [40 %-50 %] 40 % Set Rate:  [4 bmp-12 bmp] 4 bmp Vt Set:  [360 mL] 360 mL PEEP:  [5 cmH20] 5 cmH20 Pressure Support:  [5 cmH20-10 cmH20] 5 cmH20 Plateau Pressure:  [17 cmH20-19 cmH20] 19 cmH20  . sodium chloride    . cefUROXime (ZINACEF)  IV 1.5 g (12/14/18 1700)  . phenylephrine (NEO-SYNEPHRINE) Adult infusion Stopped (12/14/18 0823)    I/O last 3 completed shifts: In: 6570.8 [I.V.:3902.5; MK:[id number]; IV Piggyback:1106.3] Out: 5650 [Urine:3660; Blood:600; Chest Tube:1390]   CBC Latest Ref Rng & Units 12/14/2018 12/14/2018 12/14/2018  WBC 4.0 - 10.5 K/uL 11.5(H) - 8.4  Hemoglobin 12.0 - 15.0 g/dL 9.7(L) 9.5(L) 9.9(L)  Hematocrit 36.0 - 46.0 % 30.1(L) 28.0(L) 29.9(L)  Platelets 150 - 400 K/uL 115(L) - 103(L)    BMP Latest Ref Rng & Units 12/14/2018 12/14/2018 12/14/2018  Glucose 70 - 99 mg/dL 111(H) - 124(H)  BUN 8 - 23 mg/dL 10 - 9  Creatinine 0.44 - 1.00 mg/dL 0.67 - 0.61  BUN/Creat Ratio 12 - 28 - - -  Sodium 135 - 145 mmol/L 140 145 143  Potassium 3.5 - 5.1 mmol/L 4.1 3.6 3.3(L)  Chloride 98 - 111 mmol/L 107 - 111  CO2 22 - 32 mmol/L 24 - 23  Calcium 8.9 - 10.3 mg/dL 6.7(L) - 6.8(L)    ABG    Component Value Date/Time   PHART 7.372 12/14/2018 0537   PCO2ART 38.1 12/14/2018 0537   PO2ART 165.0 (H) 12/14/2018 0537   HCO3 22.0 12/14/2018 0537   TCO2 23 12/14/2018 0537   ACIDBASEDEF 3.0 (H) 12/14/2018 0537   O2SAT 99.0 12/14/2018 0537       Melodie Bouillon, MD 12/14/2018 5:17 PM

## 2018-12-14 NOTE — Progress Notes (Addendum)
[facility] DAILY ICU PROGRESS NOTE                   [address]            [place] [address]          [phone]   1 Day Post-Op Procedure(s) (LRB): MINIMALLY INVASIVE MITRAL VALVE (MV) REPLACEMENT using a Magna Mitral Ease 31 MM Valve. (Right) MINIMALLY INVASIVE MAZE PROCEDURE using a 45 MM AtriClip. (N/A) TRANSESOPHAGEAL ECHOCARDIOGRAM (TEE) (N/A)  Total Length of Stay:  LOS: 1 day   Subjective: Extubated, some occas nausea but overall feels ok  Objective: Vital signs in last 24 hours: Temp:  [95.1 F (35.1 C)-100.7 F (38.2 C)] 99.8 F (37.7 C) (11/19 0400) Pulse Rate:  [40-160] 40 (11/19 0700) Cardiac Rhythm: A-V Sequential paced (11/19 0400) Resp:  [0-23] 18 (11/19 0700) BP: (92-132)/(42-80) 113/43 (11/19 0700) SpO2:  [99 %-100 %] 100 % (11/19 0700) Arterial Line BP: (96-146)/(43-68) 107/48 (11/19 0700) FiO2 (%):  [40 %-50 %] 40 % (11/19 0517) Weight:  [63.3 kg] 63.3 kg (11/19 0500)  Filed Weights   12/13/18 0701 12/14/18 0500  Weight: 68 kg 63.3 kg    Weight change:    Hemodynamic parameters for last 24 hours: CVP:  [2 mmHg-8 mmHg] 5 mmHg  Intake/Output from previous day: 11/18 0701 - 11/19 0700 In: 6570.8 [I.V.:3902.5; Blood:1562; IV Piggyback:1106.3] Out: 5650 [Urine:3660; Blood:600; Chest Tube:1390]  Intake/Output this shift: No intake/output data recorded.  Current Meds: Scheduled Meds:  acetaminophen  1,000 mg Oral Q6H   aspirin EC  325 mg Oral Daily   bisacodyl  10 mg Oral Daily   Or   bisacodyl  10 mg Rectal Daily   chlorhexidine gluconate (MEDLINE KIT)  15 mL Mouth Rinse BID   Chlorhexidine Gluconate Cloth  6 each Topical Daily   docusate sodium  200 mg Oral Daily   dorzolamide-timolol  1 drop Both Eyes Daily   [START ON 12/15/2018] enoxaparin (LOVENOX) injection  30 mg Subcutaneous QHS   gabapentin  300 mg Oral TID   insulin aspart  0-24 Units Subcutaneous Q4H   latanoprost  1 drop Both Eyes QHS   mouth rinse   15 mL Mouth Rinse 10 times per day   [START ON 12/15/2018] pantoprazole  40 mg Oral Daily   sodium chloride flush  3 mL Intravenous Q12H   warfarin  2.5 mg Oral q1800   Warfarin - Physician Dosing Inpatient   Does not apply q1800   Continuous Infusions:  sodium chloride     cefUROXime (ZINACEF)  IV Stopped (12/14/18 0636)   famotidine (PEPCID) IV Stopped (12/13/18 2301)   lactated ringers     lactated ringers 100 mL/hr at 12/13/18 1639   phenylephrine (NEO-SYNEPHRINE) Adult infusion 3 mcg/min (12/14/18 0700)   PRN Meds:.[START ON 12/15/2018] ALPRAZolam, doxepin, metoprolol tartrate, morphine injection, ondansetron (ZOFRAN) IV, oxyCODONE, sodium chloride flush, sodium chloride flush, traMADol  General appearance: alert, cooperative, no distress and neuro grossly intact Heart: regular rate and rhythm and paced Lungs: slightly coarse Abdomen: soft, nontender or distended Extremities: no edema Wound: dressings CDI  Lab Results: CBC: Recent Labs    12/13/18 2025 12/14/18 0343 12/14/18 0537  WBC 9.9 8.4  --   HGB 8.5* 9.9* 9.5*  HCT 25.6* 29.9* 28.0*  PLT 104* 103*  --    BMET:  Recent Labs    12/11/18 1030  12/14/18 0343 12/14/18 0537  NA 137   < > 143 145  K 3.6   < > 3.3* 3.6  °CL 100  --  111  --   °CO2 25  --  23  --   °GLUCOSE 107*  --  124*  --   °BUN 7*  --  9  --   °CREATININE 0.71  --  0.61  --   °CALCIUM 8.9  --  6.8*  --   ° < > = values in this interval not displayed.  °  °CMET: °Lab Results  °Component Value Date  ° WBC 8.4 12/14/2018  ° HGB 9.5 (L) 12/14/2018  ° HCT 28.0 (L) 12/14/2018  ° PLT 103 (L) 12/14/2018  ° GLUCOSE 124 (H) 12/14/2018  ° ALT 19 12/11/2018  ° AST 23 12/11/2018  ° NA 145 12/14/2018  ° K 3.6 12/14/2018  ° CL 111 12/14/2018  ° CREATININE 0.61 12/14/2018  ° BUN 9 12/14/2018  ° CO2 23 12/14/2018  ° TSH 1.560 09/18/2015  ° INR 1.7 (H) 12/13/2018  ° HGBA1C 5.3 12/11/2018  ° ° ° ° °PT/INR:  °Recent Labs  °  12/13/18 °2025  °LABPROT 20.1*    °INR 1.7*  ° °Radiology: Dg Chest 1 View ° °Result Date: 12/13/2018 °CLINICAL DATA:  Incorrect needle count EXAM: CHEST  1 VIEW COMPARISON:  December 11, 2018 FINDINGS: No radiopaque curved tip surgical needle is seen. ETT is 3 cm above the carina. A left-sided central venous catheter is noted with the tip just entering the brachiocephalic. A right-sided chest tube is noted. There is hazy airspace opacity seen throughout the right lung. Prosthetic heart valve is noted. IMPRESSION: No radiopaque surgical needle identified. These results were called by telephone at the time of interpretation on 12/13/2018 at 2:48 pm to Monica in OR 15, who verbally acknowledged these results. Electronically Signed   By: Bindu  Avutu M.D.   On: 12/13/2018 14:48  ° °Dg Chest Port 1 View ° °Result Date: 12/13/2018 °CLINICAL DATA:  Status post mitral valve replacement EXAM: PORTABLE CHEST 1 VIEW COMPARISON:  Intraoperative radiograph, 12/13/2018, 2:31 p.m. FINDINGS: Interval removal of previously seen endoscopic ultrasound probe. Interval removal of a previously seen left neck pulmonary arterial catheter, with vascular sheath remaining in place. Endotracheal tube is in unchanged position approximately 1 cm above the carina. Otherwise no significant change in AP portable radiograph status post thoracotomy with right-sided chest tube in position and mitral valve prosthesis. No significant pneumothorax. Cardiomegaly. Pulmonary vascular prominence without overt pulmonary edema. IMPRESSION: 1. Interval removal of previously seen endoscopic ultrasound probe. Interval removal of a previously seen left neck pulmonary arterial catheter, with vascular sheath remaining in place. 2. Endotracheal tube is in unchanged position approximately 1 cm above the carina. Consider slight retraction. 3. Otherwise no significant change in AP portable radiograph status post thoracotomy with right-sided chest tube in position and mitral valve prosthesis. No  significant pneumothorax. 4. Cardiomegaly. Pulmonary vascular prominence without overt pulmonary edema. Electronically Signed   By: Alex  Bibbey M.D.   On: 12/13/2018 16:35  ° ° ° °Assessment/Plan: °S/P Procedure(s) (LRB): °MINIMALLY INVASIVE MITRAL VALVE (MV) REPLACEMENT using a Magna Mitral Ease 31 MM Valve. (Right) °MINIMALLY INVASIVE MAZE PROCEDURE using a 45 MM AtriClip. (N/A) °TRANSESOPHAGEAL ECHOCARDIOGRAM (TEE) (N/A) ° °1 doing well POD#1 °2 currently pacer dependent, vitals are stable, CVP  5-8, Tmax 100.7- no leukocytosis °3 expected ABL anemia- stable after products and blood transfusion- moderate serosang CT drainage- keep CT's for now °4 excellent spont UOP, monitor - does not appear significantly volume overloaded- normal   renal fxn 5 CXR minor basilar opacities, routine pulm toilet 6 initiate coumadin 7 BS controlled- not a diabetic- routine management 8 thrombocytopenia- monitor clinically  9 cardiac rehab- routine  John Giovanni [facility] 12/14/2018 7:23 AM  Pager [phone] - not for patient use  I have seen and examined the patient and agree with the assessment and plan as outlined.  Overall doing well POD1.  Currently denies pain but complains that she felt "tortured" by process of weaning from ventilator with tube in her throat.  Currently maintaining AAI paced rhythm w/ stable hemodynamics off all drips.  Very slow junctional escape under pacer.  Breathing comfortably w/ O2 sats 100% on nasal cannula.     Mobilize  Gentle diuresis  D/C lines  Supplement potassium  Restart Coumadin at reduced dose  Rexene Alberts, MD 12/14/2018 9:10 AM

## 2018-12-14 NOTE — Procedures (Signed)
Extubation Procedure Note  Patient Details:   Name: Mary Farrell DOB: Jul 18, 1945 MRN: [id number]   Airway Documentation:    Vent end date: 12/14/18 Vent end time: 0541   Evaluation  O2 sats: stable throughout Complications: No apparent complications Patient did tolerate procedure well. Bilateral Breath Sounds: Clear   Yes  Graciella Freer 12/14/2018, 5:45 AM   Pt extubated per rapid wean protocol, pt able to perform VC of 41mL and NIF of -25. Pt had positive cuff leak and was placed on 4 L [place] at this time.

## 2018-12-15 ENCOUNTER — Inpatient Hospital Stay (HOSPITAL_COMMUNITY): Payer: Medicare Other

## 2018-12-15 ENCOUNTER — Encounter (HOSPITAL_COMMUNITY): Payer: Self-pay | Admitting: Thoracic Surgery (Cardiothoracic Vascular Surgery)

## 2018-12-15 LAB — BASIC METABOLIC PANEL
Anion gap: 8 (ref 5–15)
BUN: 10 mg/dL (ref 8–23)
CO2: 26 mmol/L (ref 22–32)
Calcium: 7.2 mg/dL — ABNORMAL LOW (ref 8.9–10.3)
Chloride: 107 mmol/L (ref 98–111)
Creatinine, Ser: 0.6 mg/dL (ref 0.44–1.00)
GFR calc Af Amer: >60 mL/min (ref >60)
GFR calc non Af Amer: >60 mL/min (ref >60)
Glucose, Bld: 96 mg/dL (ref 70–99)
Potassium: 3.4 mmol/L — ABNORMAL LOW (ref 3.5–5.1)
Sodium: 141 mmol/L (ref 135–145)

## 2018-12-15 LAB — GLUCOSE, CAPILLARY
Glucose-Capillary: 105 mg/dL — ABNORMAL HIGH (ref 70–99)
Glucose-Capillary: 91 mg/dL (ref 70–99)
Glucose-Capillary: 95 mg/dL (ref 70–99)

## 2018-12-15 LAB — CBC
HCT: 28.7 % — ABNORMAL LOW (ref 36.0–46.0)
Hemoglobin: 9.3 g/dL — ABNORMAL LOW (ref 12.0–15.0)
MCH: 31 pg (ref 26.0–34.0)
MCHC: 32.4 g/dL (ref 30.0–36.0)
MCV: 95.7 fL (ref 80.0–100.0)
Platelets: 107 10*3/uL — ABNORMAL LOW (ref 150–400)
RBC: 3 MIL/uL — ABNORMAL LOW (ref 3.87–5.11)
RDW: 15.4 % (ref 11.5–15.5)
WBC: 11.8 10*3/uL — ABNORMAL HIGH (ref 4.0–10.5)
nRBC: 0 % (ref 0.0–0.2)

## 2018-12-15 LAB — SURGICAL PATHOLOGY

## 2018-12-15 LAB — PROTIME-INR
INR: 1.8 — ABNORMAL HIGH (ref 0.8–1.2)
Prothrombin Time: 20.3 seconds — ABNORMAL HIGH (ref 11.4–15.2)

## 2018-12-15 MED ORDER — POTASSIUM CHLORIDE 10 MEQ/50ML IV SOLN: 10.0000 meq | INTRAVENOUS | Status: DC

## 2018-12-15 MED ORDER — POTASSIUM CHLORIDE CRYS ER 20 MEQ PO TBCR
20.0000 meq | EXTENDED_RELEASE_TABLET | ORAL | Status: DC
  Administered 2018-12-15: 20 meq via ORAL
  Filled 2018-12-15: qty 1

## 2018-12-15 MED ORDER — FUROSEMIDE 10 MG/ML IJ SOLN
40.0000 mg | Freq: Two times a day (BID) | INTRAMUSCULAR | Status: DC
  Administered 2018-12-15 – 2018-12-18 (×7): 40 mg via INTRAVENOUS
  Filled 2018-12-15 (×6): qty 4

## 2018-12-15 MED ORDER — ~~LOC~~ CARDIAC SURGERY, PATIENT & FAMILY EDUCATION
Freq: Once | Status: AC
  Administered 2018-12-15: 08:00:00

## 2018-12-15 MED ORDER — FUROSEMIDE 10 MG/ML IJ SOLN
INTRAMUSCULAR | Status: AC
  Filled 2018-12-15: qty 2

## 2018-12-15 MED ORDER — LOSARTAN POTASSIUM 25 MG PO TABS
25.0000 mg | ORAL_TABLET | Freq: Every day | ORAL | Status: DC
  Administered 2018-12-15 – 2018-12-20 (×6): 25 mg via ORAL
  Filled 2018-12-15 (×6): qty 1

## 2018-12-15 MED ORDER — FAMOTIDINE 20 MG PO TABS
20.0000 mg | ORAL_TABLET | Freq: Every day | ORAL | Status: DC
  Administered 2018-12-15 – 2018-12-19 (×5): 20 mg via ORAL
  Filled 2018-12-15 (×5): qty 1

## 2018-12-15 MED ORDER — ASPIRIN EC 81 MG PO TBEC
81.0000 mg | DELAYED_RELEASE_TABLET | Freq: Every day | ORAL | Status: DC
  Administered 2018-12-15 – 2018-12-20 (×6): 81 mg via ORAL
  Filled 2018-12-15 (×6): qty 1

## 2018-12-15 MED ORDER — SODIUM CHLORIDE 0.9 % IV SOLN: INTRAVENOUS | Status: DC | PRN

## 2018-12-15 MED ORDER — POTASSIUM CHLORIDE 10 MEQ/50ML IV SOLN
10.0000 meq | INTRAVENOUS | Status: AC
  Administered 2018-12-15: 10 meq via INTRAVENOUS

## 2018-12-15 MED ORDER — POTASSIUM CHLORIDE 10 MEQ/50ML IV SOLN
10.0000 meq | INTRAVENOUS | Status: AC
  Administered 2018-12-15 (×7): 10 meq via INTRAVENOUS
  Filled 2018-12-15 (×8): qty 50

## 2018-12-15 NOTE — Progress Notes (Signed)
Patient ID: Mary Farrell, female   DOB: 1945/05/28, 73 y.o.   MRN: [id number] EVENING ROUNDS NOTE :     [address]       [place], [address]             [phone]                 2 Days Post-Op Procedure(s) (LRB): MINIMALLY INVASIVE MITRAL VALVE (MV) REPLACEMENT using a Magna Mitral Ease 31 MM Valve. (Right) MINIMALLY INVASIVE MAZE PROCEDURE using a 45 MM AtriClip. (N/A) TRANSESOPHAGEAL ECHOCARDIOGRAM (TEE) (N/A)  Total Length of Stay:  LOS: 2 days  BP 139/64   Pulse 70   Temp 98.4 F (36.9 C) (Oral)   Resp (!) 23   Ht 5' (1.524 m)   Wt 75.1 kg   SpO2 98%   BMI 32.33 kg/m   .Intake/Output      11/19 0701 - 11/20 0700 11/20 0701 - 11/21 0700   P.O. 360    I.V. (mL/kg) 3.2 (0) 81.4 (1.1)   Blood     IV Piggyback 433.1 268.2   Total Intake(mL/kg) 796.2 (10.6) 349.7 (4.7)   Urine (mL/kg/hr) 1915 (1.1) 1050 (1.3)   Blood     Chest Tube 150 330   Total Output 2065 1380   Net -1268.8 -1030.3          . sodium chloride    . sodium chloride 10 mL/hr at 12/15/18 1700  . potassium chloride 10 mEq (12/15/18 1707)     Lab Results  Component Value Date   WBC 11.8 (H) 12/15/2018   HGB 9.3 (L) 12/15/2018   HCT 28.7 (L) 12/15/2018   PLT 107 (L) 12/15/2018   GLUCOSE 96 12/15/2018   ALT 19 12/11/2018   AST 23 12/11/2018   NA 141 12/15/2018   K 3.4 (L) 12/15/2018   CL 107 12/15/2018   CREATININE 0.60 12/15/2018   BUN 10 12/15/2018   CO2 26 12/15/2018   TSH 1.560 09/18/2015   INR 1.8 (H) 12/15/2018   HGBA1C 5.3 12/11/2018    Still on pacer Up to chair   Grace Isaac MD  Beeper [phone] Office [phone] 12/15/2018 5:54 PM

## 2018-12-15 NOTE — Discharge Summary (Signed)
Physician Discharge Summary  Patient ID: Mary Farrell MRN: [id number] DOB/AGE: Oct 12, 1945 73 y.o.  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 valve + maze procedure 12/13/2018   S/P minimally-invasive maze operation for atrial fibrillation 12/13/2018   Mitral stenosis with regurgitation    Long term (current) use of anticoagulants 10/23/2018   Nausea 05/01/2018   Vertigo 05/01/2018   Lump on finger 12/30/2017   Pain in finger 12/29/2017   Paroxysmal A-fib ([place]) 06/24/2017   Persistent atrial fibrillation 03/10/2017   Asymptomatic microscopic hematuria 11/02/2016   Rash 06/08/2016   Atypical chest pain 05/13/2016   Atrial flutter ([place]) 09/18/2015   Pericardial effusion 09/18/2015   Aortic regurgitation 08/13/2015   Dysuria 09/24/2014   IC (interstitial cystitis) 09/24/2014   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   Essential hypertension 09/05/2013   Anxiety 06/29/2012   GERD (gastroesophageal reflux disease) 03/11/2011   Dyspnea 10/30/2010    HPI: at time of surgical evaluation  Patient is a 73 year old female with likely rheumatic heart disease including mitral stenosis, aortic insufficiency, and recurrent paroxysmal atrial fibrillation and atrial flutter who describes a long history of symptoms consistent with chronic diastolic congestive heart failure and was hospitalized following a brief syncopal  event whoreturns to the office today for follow up consultation visit regarding themanagement of severe symptomatic mitral stenosis and recurrent paroxysmal atrial fibrillation. I recently had the opportunity to evaluation the patient in consultation on October 18, 2018 while she was hospitalized following a brief syncopal event. No definite cause of syncope was identified although it was felt highly likely related to the patient's medications, specifically her beta-blocker. According to the patient she had developed an episode of paroxysmal atrial fibrillation the day before and was instructed to take an extra dose of carvedilol. She took another extra dose of carvedilol the following morning when she felt like she was still in atrial fibrillation, and it was subsequent to that event that she passed out. During her hospitalization her dose of carvedilol was reduced but additional diagnostic testing prompted surgical consultation for management of severe mitral stenosis. At the time of my initial consultation the patient was not interested in considering surgical intervention during that hospitalization and wanted to go home. She was converted from Eliquis to warfarin for long-term anticoagulation and has been seen in follow-up on 1 occasion at [facility].She called our office to request a follow-up consultation visit and returns to our office today with her husband. She states that since hospital discharge she has remained stable. She has not had any further syncopal episodes although she does report occasional slight dizziness. She describes stable symptoms of exertional shortness of breath. She states that she gets short of breath with low-level activity and occasionally gets short of breath at night. Her weight has been stable and she has not had increased lower extremity edema. She has never had any chest pain or chest tightness. She thinks that she has been maintaining  sinus rhythm  since hospital discharge. She is now wearing a Zio patch.  Patient is a 73 year old female with likely rheumatic heart disease including mitral stenosis, aortic insufficiency, and recurrent paroxysmal atrial fibrillation and atrial flutter whoreturns to the office today with tentative plans to proceed with minimally invasive mitral valve replacement and Maze procedure on December 13, 2018. She was last seen here in the office on November 16, 2018. Since that time she reports no new problems although she does admit that she has been quite nervous recently and she has developed some nausea for which she takes oral Zofran. Appetite is been stable and in fact she has been eating a bit more than usual. She just celebrated one of her birthdays. She has not had any increase in shortness of breath. She denies any fevers, chills, or productive cough. Although she is anxious about her surgery she wants to go ahead and get it over with. She stopped taking Coumadin last week in preparation for surgery and has been taking Lovenox injections.  She was admitted electively for the procedure  Discharged Condition: good  Hospital Course: Patient was admitted electively and on 12/13/2018 she was taken to the operating room where she underwent the below described procedure.  She tolerated it well and was taken to the surgical intensive care unit in stable condition  Postoperative hospital course:  The patient has done well with steady progress.  She was weaned from the ventilator using standard protocols without difficulty.  She has remained hemodynamically stable initially requiring temporary pacemaker.  She did over time gradually regain sinus rhythm with a first-degree AV block.  She also had short episodes of atrial fibrillation and was restarted back on her oral flecainide which she was on during the preoperative period.  All routine lines, monitors and drainage devices have been discontinued in the standard  fashion.  She does have an expected acute blood loss anemia and ended require intraoperative transfusion of blood products as well as packed cells.  These values have stabilized and most recent hemoglobin and hematocrit are 8.8 and 27.7 respectively.  She did have a postoperative thrombocytopenia but values have continued to improve over time and it this point her most recent platelet count is 144,000.  She does have postoperative volume overload but is responding well to diuretics which will continue for a short term as outpatient.  Renal function has remained within normal limits and she has excellent urine output.  Incisions are noted to be healing well without evidence of infection.  She is tolerating diet.  She has been weaned from oxygen and maintains good saturations on room air.  She is tolerating gradually increasing activities using standard cardiac rehab modalities and she also had physical therapy assist her.  It was felt she may benefit from CIR however her progress has been excellent and she is no longer felt to require that.  At the time of discharge the patient is felt to be quite stable.  We will order her a rolling walker at time of discharge which she would benefit from for at least a short time.    Consults: None  Significant Diagnostic Studies: routine post op labs and serial CXR's  Treatments: surgery:          CARDIOTHORACIC SURGERY OPERATIVE NOTE  Date of Procedure:                            12/13/2018  Preoperative Diagnosis:  Severe Mitral Stenosis  Recurrent Persistent Atrial Fibrillation  Postoperative Diagnosis:    Same  Procedure:        Minimally-Invasive Mitral Valve Replacement             Edwards Magna Mitral Bovine bioprosthetic tissue valve (size 72mm, model #7300TFX, serial [id number])   Minimally-Invasive Maze Procedure             Complete bilateral atrial lesion set using cryothermy and bipolar radiofrequency ablation              Clipping of Left Atrial Appendage (Atricure Pro245 left atrial clip, size 45 mm)  Surgeon:        [name]. Roxy Manns, MD  Assistant:       John Giovanni, PA-C  Anesthesia:    Laurie Panda, MD  Operative Findings: ? Rheumatic mitral valve disease with severe mitral stenosis ? Mild aortic insufficiency ? Normal left ventricular systolic function        Discharge Exam: Blood pressure (!) 122/47, pulse 87, temperature 97.7 F (36.5 C), temperature source Oral, resp. rate 16, height 5' (1.524 m), weight 69.4 kg, SpO2 93 %.   General appearance: alert, cooperative and no distress Heart: regular rate and rhythm and soft systolic murmur Lungs: dim in lower fields Abdomen: benign Extremities: minor ankle edema Wound: incis healing well  Disposition: Discharge disposition: 01-Home or Self Care       Discharge Instructions    Discharge patient   Complete by: As directed    At least 4 hours after epw's removed   Discharge disposition: 01-Home or Self Care   Discharge patient date: 12/20/2018     Allergies as of 12/20/2018      Reactions   Amoxicillin Diarrhea   Has patient had a PCN reaction causing immediate rash, facial/tongue/throat swelling, SOB or lightheadedness with hypotension: no Has patient had a PCN reaction causing severe rash involving mucus membranes or skin necrosis: no Has patient had a PCN reaction that required hospitalization: no Has patient had a PCN reaction occurring within the last 10 years: no If all of the above answers are "NO", then may proceed with Cephalosporin use.   Metoprolol Tartrate Other (See Comments)   Mouth sores "mouth tastes like mold"   Oxaprozin Nausea Only      Medication List    STOP taking these medications   carvedilol 3.125 MG tablet Commonly known as: COREG   enoxaparin 60 MG/0.6ML injection Commonly known as: LOVENOX   hydrochlorothiazide 12.5 MG capsule Commonly known as: MICROZIDE   ondansetron 4 MG  tablet Commonly known as: ZOFRAN     TAKE these medications   acetaminophen 500 MG tablet Commonly known as: TYLENOL Take 1,000 mg by mouth every 6 (six) hours as needed for moderate pain or headache.   ALPRAZolam 0.25 MG tablet Commonly known as: XANAX Take 0.125 mg by mouth 2 (two) times daily as needed for anxiety.   aspirin 81 MG EC tablet Take 1 tablet (81 mg total) by mouth daily.   cetirizine 10 MG tablet Commonly known as: ZYRTEC Take 10 mg by mouth at bedtime.   conjugated estrogens vaginal cream Commonly known as: PREMARIN Place 1 Applicatorful vaginally daily as needed (irritation).   dorzolamide-timolol 22.3-6.8 MG/ML ophthalmic solution Commonly known as: COSOPT Place 1 drop into both eyes daily.   doxepin 10 MG capsule Commonly known as: SINEQUAN Take 10-20 mg by mouth at bedtime as needed (sleep).   famotidine 20 MG tablet Commonly known  as: PEPCID Take 20 mg by mouth at bedtime.   flecainide 50 MG tablet Commonly known as: TAMBOCOR TAKE 2 TABLETS BY MOUTH TWO TIMES DAILY What changed: See the new instructions.   furosemide 40 MG tablet Commonly known as: LASIX Take 1 tablet (40 mg total) by mouth daily.   gabapentin 300 MG capsule Commonly known as: NEURONTIN Take 300 mg by mouth 3 (three) times daily.   lactobacillus acidophilus Tabs tablet Take 1 tablet by mouth daily.   latanoprost 0.005 % ophthalmic solution Commonly known as: XALATAN Place 1 drop into both eyes at bedtime.   losartan 25 MG tablet Commonly known as: COZAAR Take 1 tablet (25 mg total) by mouth daily. What changed:   medication strength  how much to take   magnesium oxide 400 (241.3 Mg) MG tablet Commonly known as: MAG-OX Take 1 tablet (400 mg total) by mouth 2 (two) times daily.   multivitamin with minerals Tabs tablet Take 1 tablet by mouth daily.   NON FORMULARY Apply 1 application topically 2 (two) times daily as needed (for eczema). Traimcinolone/[facility]  Moist Cream   omeprazole 40 MG capsule Commonly known as: PRILOSEC Take 40 mg by mouth daily.   oxybutynin 10 MG 24 hr tablet Commonly known as: DITROPAN-XL Take 10 mg by mouth daily.   polyethylene glycol 17 g packet Commonly known as: MIRALAX / GLYCOLAX Take 17 g by mouth daily as needed for moderate constipation.   potassium chloride SA 20 MEQ tablet Commonly known as: KLOR-CON Take 1 tablet (20 mEq total) by mouth daily.   PRESCRIPTION MEDICATION Inhale into the lungs at bedtime. CPAP   traMADol 50 MG tablet Commonly known as: ULTRAM Take 1 tablet (50 mg total) by mouth every 6 (six) hours as needed for up to 7 days for moderate pain.   Vitamin D 50 MCG (2000 UT) Caps Take 2,000 Units by mouth daily.   warfarin 2.5 MG tablet Commonly known as: Coumadin Take as directed. If you are unsure how to take this medication, talk to your nurse or doctor. Original instructions: Take 1 tablet (2.5 mg total) by mouth daily at 6 PM. Take 1 tablet by mouth daily or as directed by coumadin clinic What changed:   medication strength  how much to take  how to take this  when to take this      Follow-up Information    Skeet Latch, MD Follow up.   Specialty: Cardiology Contact information: [address] [place] [address] [phone]        Rexene Alberts, MD Follow up.   Specialty: Cardiothoracic Surgery Contact information: [place] [address] [phone]           Signed: John Giovanni PA-C 12/20/2018, 10:02 AM

## 2018-12-15 NOTE — Progress Notes (Addendum)
[address]       [place] [address]             [phone]      2 Days Post-Op Procedure(s) (LRB): MINIMALLY INVASIVE MITRAL VALVE (MV) REPLACEMENT using a Magna Mitral Ease 31 MM Valve. (Right) MINIMALLY INVASIVE MAZE PROCEDURE using a 45 MM AtriClip. (N/A) TRANSESOPHAGEAL ECHOCARDIOGRAM (TEE) (N/A) Subjective: C/O not sleeping well, some DOE  Objective: Vital signs in last 24 hours: Temp:  [98 F (36.7 C)-99 F (37.2 C)] 99 F (37.2 C) (11/20 0400) Pulse Rate:  [70-89] 80 (11/20 0700) Cardiac Rhythm: A-V Sequential paced (11/19 2000) Resp:  [15-27] 18 (11/20 0700) BP: (98-155)/(42-112) 147/46 (11/20 0700) SpO2:  [98 %-100 %] 99 % (11/20 0700) Arterial Line BP: (107-116)/(45-54) 116/48 (11/19 1000) Weight:  [75.1 kg] 75.1 kg (11/20 0500)  Hemodynamic parameters for last 24 hours: CVP:  [5 mmHg-9 mmHg] 8 mmHg  Intake/Output from previous day: 11/19 0701 - 11/20 0700 In: 796.2 [P.O.:360; I.V.:3.2; IV Piggyback:433.1] Out: 2065 [Urine:1915; Chest Tube:150] Intake/Output this shift: No intake/output data recorded.  General appearance: alert, cooperative and no distress Heart: regular rate and rhythm Lungs: mildly dim in bases Abdomen: benign Extremities: some edema Wound: dressings intact  Lab Results: Recent Labs    12/14/18 1557 12/15/18 0335  WBC 11.5* 11.8*  HGB 9.7* 9.3*  HCT 30.1* 28.7*  PLT 115* 107*   BMET:  Recent Labs    12/14/18 1557 12/15/18 0335  NA 140 141  K 4.1 3.4*  CL 107 107  CO2 24 26  GLUCOSE 111* 96  BUN 10 10  CREATININE 0.67 0.60  CALCIUM 6.7* 7.2*    PT/INR:  Recent Labs    12/15/18 0335  LABPROT 20.3*  INR 1.8*   ABG    Component Value Date/Time   PHART 7.372 12/14/2018 0537   HCO3 22.0 12/14/2018 0537   TCO2 23 12/14/2018 0537   ACIDBASEDEF 3.0 (H) 12/14/2018 0537   O2SAT 99.0 12/14/2018 0537   CBG (last 3)  Recent Labs    12/14/18 2024 12/15/18 0001 12/15/18 0341  GLUCAP 111*  95 91    Meds Scheduled Meds:  acetaminophen  1,000 mg Oral Q6H   bisacodyl  10 mg Oral Daily   Or   bisacodyl  10 mg Rectal Daily   chlorhexidine gluconate (MEDLINE KIT)  15 mL Mouth Rinse BID   Chlorhexidine Gluconate Cloth  6 each Topical Daily   coumadin book   Does not apply Once   docusate sodium  200 mg Oral Daily   dorzolamide-timolol  1 drop Both Eyes Daily   enoxaparin (LOVENOX) injection  30 mg Subcutaneous QHS   furosemide  20 mg Intravenous BID   gabapentin  300 mg Oral TID   insulin aspart  0-24 Units Subcutaneous Q4H   latanoprost  1 drop Both Eyes QHS   pantoprazole  40 mg Oral Daily   potassium chloride  20 mEq Oral Q4H   warfarin  2.5 mg Oral q1800   Warfarin - Physician Dosing Inpatient   Does not apply q1800   Continuous Infusions:  sodium chloride     phenylephrine (NEO-SYNEPHRINE) Adult infusion Stopped (12/14/18 0823)   PRN Meds:.ALPRAZolam, doxepin, metoprolol tartrate, morphine injection, ondansetron (ZOFRAN) IV, oxyCODONE, sodium chloride flush, sodium chloride flush, traMADol  Xrays Dg Chest 1 View  Result Date: 12/13/2018 CLINICAL DATA:  Incorrect needle count EXAM: CHEST  1 VIEW COMPARISON:  December 11, 2018 FINDINGS: No  radiopaque curved tip surgical needle is seen. ETT is 3 cm above the carina. A left-sided central venous catheter is noted with the tip just entering the brachiocephalic. A right-sided chest tube is noted. There is hazy airspace opacity seen throughout the right lung. Prosthetic heart valve is noted. IMPRESSION: No radiopaque surgical needle identified. These results were called by telephone at the time of interpretation on 12/13/2018 at 2:48 pm to [facility] in [place], who verbally acknowledged these results. Electronically Signed   By: Prudencio Pair M.D.   On: 12/13/2018 14:48   Dg Chest Port 1 View  Result Date: 12/14/2018 CLINICAL DATA:  Status post mitral valve replacement. EXAM: PORTABLE CHEST 1 VIEW COMPARISON:   December 13, 2018. FINDINGS: Stable cardiomediastinal silhouette. Endotracheal tube is unchanged in position. Left internal jugular catheter is unchanged. Right-sided chest tube is unchanged. No definite pneumothorax is noted. Stable bilateral lower lobe opacities are noted. Bony thorax is unremarkable. IMPRESSION: Stable support apparatus. Stable bilateral lung opacities as described above. Stable right-sided chest tube without pneumothorax. Electronically Signed   By: Marijo Conception M.D.   On: 12/14/2018 07:33   Dg Chest Port 1 View  Result Date: 12/13/2018 CLINICAL DATA:  Status post mitral valve replacement EXAM: PORTABLE CHEST 1 VIEW COMPARISON:  Intraoperative radiograph, 12/13/2018, 2:31 p.m. FINDINGS: Interval removal of previously seen endoscopic ultrasound probe. Interval removal of a previously seen left neck pulmonary arterial catheter, with vascular sheath remaining in place. Endotracheal tube is in unchanged position approximately 1 cm above the carina. Otherwise no significant change in AP portable radiograph status post thoracotomy with right-sided chest tube in position and mitral valve prosthesis. No significant pneumothorax. Cardiomegaly. Pulmonary vascular prominence without overt pulmonary edema. IMPRESSION: 1. Interval removal of previously seen endoscopic ultrasound probe. Interval removal of a previously seen left neck pulmonary arterial catheter, with vascular sheath remaining in place. 2. Endotracheal tube is in unchanged position approximately 1 cm above the carina. Consider slight retraction. 3. Otherwise no significant change in AP portable radiograph status post thoracotomy with right-sided chest tube in position and mitral valve prosthesis. No significant pneumothorax. 4. Cardiomegaly. Pulmonary vascular prominence without overt pulmonary edema. Electronically Signed   By: Eddie Candle M.D.   On: 12/13/2018 16:35    Assessment/Plan: S/P Procedure(s) (LRB): MINIMALLY INVASIVE  MITRAL VALVE (MV) REPLACEMENT using a Magna Mitral Ease 31 MM Valve. (Right) MINIMALLY INVASIVE MAZE PROCEDURE using a 45 MM AtriClip. (N/A) TRANSESOPHAGEAL ECHOCARDIOGRAM (TEE) (N/A)  1 hemodyn stable but requiring pacer- currently apaced, ? R on t spike- will check 12 lead- not having vent ectopy . No beta blocker for now. + some HTN readings- will restart ARB at half dose for now- renal fxn is normal 2 wt 7 kg over preop- cont diuresis for volume overload, may need to increase dose 3 sats good on 2 liters, CXR - aeration improved, routine pulm toilet 4 ABL anemia is stable 5 leukocytosis- minimal, no fevers 6 replace K+ 7 BS controlled 8 will ask PT to assist with rehab as she is pretty weak 9 thrombocytopenia trens slightly down at 107K- cont to monitor, she is on lovenox.  10 INR 1.8- cont 2.5 mg coumadinfor now but may need a pretty low dose 11 chest tube drainage slowing- poss d/c tubes soon  LOS: 2 days    John Giovanni [facility] 12/15/2018 Pager [phone] - not for patient use  I have seen and examined the patient and agree with the assessment and plan  as outlined.  Long PR interval with AAI pacing is expected after maze procedure and there is no reason to worry about R on T with atrial pacing.  Patient should NOT be VVI paced or DDD paced unless there is failure to capture with AAI pacing.  Underlying escape rhythm remains too slow to feel comfortable with transfer to step-down unit.  Patient is otherwise doing very well.  Mobilize.  Diuresis with increased lasix dose.  Supplement potassium.  Leave chest tubes in for now.  Continue to hold flecainide, beta blockers, etc.  Rexene Alberts, MD 12/15/2018 8:30 AM

## 2018-12-16 LAB — CBC
HCT: 28.1 % — ABNORMAL LOW (ref 36.0–46.0)
Hemoglobin: 9.1 g/dL — ABNORMAL LOW (ref 12.0–15.0)
MCH: 31 pg (ref 26.0–34.0)
MCHC: 32.4 g/dL (ref 30.0–36.0)
MCV: 95.6 fL (ref 80.0–100.0)
Platelets: 116 10*3/uL — ABNORMAL LOW (ref 150–400)
RBC: 2.94 MIL/uL — ABNORMAL LOW (ref 3.87–5.11)
RDW: 14.9 % (ref 11.5–15.5)
WBC: 12 10*3/uL — ABNORMAL HIGH (ref 4.0–10.5)
nRBC: 0 % (ref 0.0–0.2)

## 2018-12-16 LAB — BASIC METABOLIC PANEL
Anion gap: 9 (ref 5–15)
BUN: 14 mg/dL (ref 8–23)
CO2: 26 mmol/L (ref 22–32)
Calcium: 7.8 mg/dL — ABNORMAL LOW (ref 8.9–10.3)
Chloride: 102 mmol/L (ref 98–111)
Creatinine, Ser: 0.56 mg/dL (ref 0.44–1.00)
GFR calc Af Amer: >60 mL/min (ref >60)
GFR calc non Af Amer: >60 mL/min (ref >60)
Glucose, Bld: 95 mg/dL (ref 70–99)
Potassium: 4 mmol/L (ref 3.5–5.1)
Sodium: 137 mmol/L (ref 135–145)

## 2018-12-16 LAB — PROTIME-INR
INR: 1.9 — ABNORMAL HIGH (ref 0.8–1.2)
Prothrombin Time: 21.9 seconds — ABNORMAL HIGH (ref 11.4–15.2)

## 2018-12-16 LAB — MAGNESIUM: Magnesium: 2.3 mg/dL (ref 1.7–2.4)

## 2018-12-16 LAB — GLUCOSE, CAPILLARY
Glucose-Capillary: 95 mg/dL (ref 70–99)
Glucose-Capillary: 122 mg/dL — ABNORMAL HIGH (ref 70–99)

## 2018-12-16 MED ORDER — OXYCODONE HCL 5 MG PO TABS: 5.0000 mg | ORAL_TABLET | ORAL | Status: DC | PRN

## 2018-12-16 MED ORDER — TRAMADOL HCL 50 MG PO TABS
50.0000 mg | ORAL_TABLET | ORAL | Status: DC | PRN
  Administered 2018-12-16 – 2018-12-20 (×7): 50 mg via ORAL
  Filled 2018-12-16 (×8): qty 1

## 2018-12-16 MED ORDER — CHLORHEXIDINE GLUCONATE CLOTH 2 % EX PADS
6.0000 | MEDICATED_PAD | Freq: Every morning | CUTANEOUS | Status: DC
  Administered 2018-12-17 – 2018-12-18 (×2): 6 via TOPICAL

## 2018-12-16 NOTE — Progress Notes (Signed)
Pt room entered at 2210, multiple staff at bedside. Pt had climbed OOB, and lost her balance. Pt stated she was going to call her husband and was looking for a telephone. Pt was caught by another staff member as her knee hit the floor. Pt stated that this RN "was trying to kill her" by giving a scheduled lovenox injection. Prior to the injection, pt was educated on why lovenox was used to bridge anti coagulation until coumadin levels became therapeutic and verbalized understanding and approval of the administration. Pt then said " there was something about me she did not trust" and that is why she wanted to call her husband. On call MD informed of pt status- information acknowledged, no orders rec'd. Pt spouse then informed of situation, questions answered to husbands satisfaction. When room re-entered, pt was A&O x 4 and at neuro baseline. Pt  stated "she had a bad dream", and apologized for saying I tried to kil her. Charge RN informed of pt status.

## 2018-12-16 NOTE — Evaluation (Signed)
Physical Therapy Evaluation Patient Details Name: Mary Farrell MRN: [id number] DOB: 1945/04/16 Today's Date: 12/16/2018   History of Present Illness  73 year old female with likely rheumatic heart disease including mitral stenosis, aortic insufficiency, and recurrent paroxysmal atrial fibrillation and atrial flutter who describes a long history of symptoms consistent with chronic diastolic congestive heart failure. PT with severe symptomatic mitral valve stenosis and recurrent paroxysmal afib, now s/p minimally invasive mitral valve replacement on 11/18, pacer dependent at this time.   Clinical Impression  Pt demonstrates deficits in functional mobility, gait, balance, endurance, strength, power, safety awareness, and awareness of precautions. Pt requires physical support during functional mobility due to imbalance and lateral and posterior leas, requiring minG-modA to correct at times. Pt also requires frequent verbal cues to reduce use of UE to maintain pacing leads. Pt will benefit from continued acute PT to improve functional mobility and aide in a return to independent mobility.    Follow Up Recommendations CIR;Supervision/Assistance - 24 hour    Equipment Recommendations  Rolling walker with 5" wheels    Recommendations for Other Services       Precautions / Restrictions Precautions Precautions: Fall;ICD/Pacemaker Restrictions Weight Bearing Restrictions: No      Mobility  Bed Mobility Overal bed mobility: Needs Assistance Bed Mobility: Supine to Sit     Supine to sit: Min assist     General bed mobility comments: PT verbal cues to limit pushing through UE  Transfers Overall transfer level: Needs assistance   Transfers: Sit to/from Stand Sit to Stand: Min assist         General transfer comment: pt with posterior lean, requiring verbal cues for hand placement during transfer  Ambulation/Gait Ambulation/Gait assistance: Mod assist Gait Distance (Feet): 5  Feet Assistive device: (EVA walker) Gait Pattern/deviations: Step-to pattern;Leaning posteriorly;Wide base of support Gait velocity: reduced Gait velocity interpretation: <1.31 ft/sec, indicative of household ambulator General Gait Details: pt with short shuffling steps to turn from bed to bedside commode and walk from bedside commode to recliner. Pt with strong posterior lean requiring modA to correct  Stairs            Wheelchair Mobility    Modified Rankin (Stroke Patients Only)       Balance Overall balance assessment: Needs assistance Sitting-balance support: Single extremity supported;Feet supported Sitting balance-Leahy Scale: Fair Sitting balance - Comments: minG Postural control: Left lateral lean Standing balance support: Bilateral upper extremity supported Standing balance-Leahy Scale: Poor Standing balance comment: min-modA with posterior lean                             Pertinent Vitals/Pain Pain Assessment: Faces Faces Pain Scale: Hurts even more Pain Location: R shoulder/upper chest Pain Descriptors / Indicators: Aching Pain Intervention(s): Limited activity within patient's tolerance    Home Living Family/patient expects to be discharged to:: Private residence Living Arrangements: Spouse/significant other Available Help at Discharge: Family;Available 24 hours/day Type of Home: House Home Access: Stairs to enter Entrance Stairs-Rails: Right Entrance Stairs-Number of Steps: 3 Home Layout: One level Home Equipment: Cane - single point      Prior Function Level of Independence: Independent         Comments: pt reports being independent but wobbly     Hand Dominance        Extremity/Trunk Assessment   Upper Extremity Assessment Upper Extremity Assessment: Generalized weakness(limited 2/2 pacer leads)    Lower Extremity Assessment Lower Extremity Assessment: Generalized  weakness    Cervical / Trunk Assessment Cervical /  Trunk Assessment: Normal  Communication   Communication: No difficulties  Cognition Arousal/Alertness: Awake/alert Behavior During Therapy: WFL for tasks assessed/performed Overall Cognitive Status: Within Functional Limits for tasks assessed                                        General Comments General comments (skin integrity, edema, etc.): VSS, pacing, 2L [place]    Exercises     Assessment/Plan    PT Assessment Patient needs continued PT services  PT Problem List Decreased strength;Decreased activity tolerance;Decreased balance;Decreased mobility;Decreased knowledge of use of DME;Decreased safety awareness;Decreased knowledge of precautions;Cardiopulmonary status limiting activity       PT Treatment Interventions DME instruction;Gait training;Stair training;Functional mobility training;Therapeutic activities;Therapeutic exercise;Neuromuscular re-education;Balance training;Patient/family education    PT Goals (Current goals can be found in the Care Plan section)  Acute Rehab PT Goals Patient Stated Goal: To return to prior level of function PT Goal Formulation: With patient Time For Goal Achievement: 12/30/18 Potential to Achieve Goals: Good    Frequency Min 3X/week   Barriers to discharge        Co-evaluation               AM-PAC PT "6 Clicks" Mobility  Outcome Measure Help needed turning from your back to your side while in a flat bed without using bedrails?: A Little Help needed moving from lying on your back to sitting on the side of a flat bed without using bedrails?: A Little Help needed moving to and from a bed to a chair (including a wheelchair)?: A Little Help needed standing up from a chair using your arms (e.g., wheelchair or bedside chair)?: A Little Help needed to walk in hospital room?: A Little Help needed climbing 3-5 steps with a railing? : A Lot 6 Click Score: 17    End of Session Equipment Utilized During Treatment:  Oxygen Activity Tolerance: Patient tolerated treatment well Patient left: in chair;with call bell/phone within reach Nurse Communication: Mobility status PT Visit Diagnosis: Unsteadiness on feet (R26.81)    Time: [id number] PT Time Calculation (min) (ACUTE ONLY): 34 min   Charges:   PT Evaluation $PT Eval Moderate Complexity: 1 Mod PT Treatments $Therapeutic Activity: 8-22 mins        Zenaida Niece, PT, DPT Acute Rehabilitation Pager: [phone]   Zenaida Niece 12/16/2018, 4:04 PM

## 2018-12-16 NOTE — Progress Notes (Signed)
Pt foley removed prior to assumption of care, post foley UOP  Reported. Pt had a pure wick in place, but no UOP noted since care assumed. Bladder scan revealed >999 cc. Female RN asked to perform I&O cath for pt comfort- 1050 cc clear yellow urine obtained.

## 2018-12-16 NOTE — Progress Notes (Signed)
Patient ID: NANDA BITTICK, female   DOB: 08-04-1945, 73 y.o.   MRN: [id number] TCTS DAILY ICU PROGRESS NOTE                   [address]            [place] [address]          [phone]   3 Days Post-Op Procedure(s) (LRB): MINIMALLY INVASIVE MITRAL VALVE (MV) REPLACEMENT using a Magna Mitral Ease 31 MM Valve. (Right) MINIMALLY INVASIVE MAZE PROCEDURE using a 45 MM AtriClip. (N/A) TRANSESOPHAGEAL ECHOCARDIOGRAM (TEE) (N/A)  Total Length of Stay:  LOS: 3 days   Subjective: Patient awake and alert neurologically intact and able to relate events last night, nurse was giving her Lovenox last night the patient thought he was trying to kill her.  Later she tried to get up not observed and fell to the floor hitting her left knee.  Currently she denies any injury, specifically denies any knee pain.  Objective: Vital signs in last 24 hours: Temp:  [98.2 F (36.8 C)-99 F (37.2 C)] 99 F (37.2 C) (11/21 0800) Pulse Rate:  [69-70] 69 (11/21 0700) Cardiac Rhythm: Atrial paced (11/21 0800) Resp:  [12-28] 17 (11/21 0700) BP: (103-164)/(46-109) 150/57 (11/21 0700) SpO2:  [92 %-100 %] 100 % (11/21 0700) Weight:  [72.6 kg] 72.6 kg (11/21 0800)  Filed Weights   12/14/18 0500 12/15/18 0500 12/16/18 0800  Weight: 63.3 kg 75.1 kg 72.6 kg    Weight change:    Hemodynamic parameters for last 24 hours:    Intake/Output from previous day: 11/20 0701 - 11/21 0700 In: 976 [P.O.:480; I.V.:84; IV Piggyback:412] Out: 2860 [Urine:2350; Chest Tube:510]  Intake/Output this shift: No intake/output data recorded.  Current Meds: Scheduled Meds: . acetaminophen  1,000 mg Oral Q6H  . aspirin EC  81 mg Oral Daily  . bisacodyl  10 mg Oral Daily   Or  . bisacodyl  10 mg Rectal Daily  . chlorhexidine gluconate (MEDLINE KIT)  15 mL Mouth Rinse BID  . Chlorhexidine Gluconate Cloth  6 each Topical Daily  . coumadin book   Does not apply Once  . docusate sodium  200 mg Oral Daily  .  dorzolamide-timolol  1 drop Both Eyes Daily  . enoxaparin (LOVENOX) injection  30 mg Subcutaneous QHS  . famotidine  20 mg Oral QHS  . furosemide  40 mg Intravenous BID  . gabapentin  300 mg Oral TID  . latanoprost  1 drop Both Eyes QHS  . losartan  25 mg Oral Daily  . warfarin  2.5 mg Oral q1800  . Warfarin - Physician Dosing Inpatient   Does not apply q1800   Continuous Infusions: . sodium chloride    . sodium chloride Stopped (12/15/18 1815)   PRN Meds:.sodium chloride, ALPRAZolam, doxepin, metoprolol tartrate, morphine injection, ondansetron (ZOFRAN) IV, oxyCODONE, sodium chloride flush, sodium chloride flush, traMADol  General appearance: alert, cooperative and no distress Neurologic: intact Heart: Currently paced at 70 atrially, underlying rhythm asystolic Lungs: diminished breath sounds bibasilar Abdomen: soft, non-tender; bowel sounds normal; no masses,  no organomegaly Extremities: extremities normal, atraumatic, no cyanosis or edema Wound: Chest tubes intact  Lab Results: CBC: Recent Labs    12/15/18 0335 12/16/18 0450  WBC 11.8* 12.0*  HGB 9.3* 9.1*  HCT 28.7* 28.1*  PLT 107* 116*   BMET:  Recent Labs    12/15/18 0335 12/16/18 0450  NA 141 137  K 3.4* 4.0  CL 107 102  CO2 26 26  GLUCOSE 96 95  BUN 10 14  CREATININE 0.60 0.56  CALCIUM 7.2* 7.8*    CMET: Lab Results  Component Value Date   WBC 12.0 (H) 12/16/2018   HGB 9.1 (L) 12/16/2018   HCT 28.1 (L) 12/16/2018   PLT 116 (L) 12/16/2018   GLUCOSE 95 12/16/2018   ALT 19 12/11/2018   AST 23 12/11/2018   NA 137 12/16/2018   K 4.0 12/16/2018   CL 102 12/16/2018   CREATININE 0.56 12/16/2018   BUN 14 12/16/2018   CO2 26 12/16/2018   TSH 1.560 09/18/2015   INR 1.9 (H) 12/16/2018   HGBA1C 5.3 12/11/2018      PT/INR:  Recent Labs    12/16/18 0450  LABPROT 21.9*  INR 1.9*   Radiology: No results found.   Assessment/Plan: S/P Procedure(s) (LRB): MINIMALLY INVASIVE MITRAL VALVE (MV)  REPLACEMENT using a Magna Mitral Ease 31 MM Valve. (Right) MINIMALLY INVASIVE MAZE PROCEDURE using a 45 MM AtriClip. (N/A) TRANSESOPHAGEAL ECHOCARDIOGRAM (TEE) (N/A) Mobilize Diuresis Currently on Coumadin 2.5 mg a day, INR 1.9 today, will DC Lovenox Continue atrial pacing at 70 Monitor patient carefully to avoid falls in the ICU Expected Acute  Blood - loss Anemia- continue to monitor    Grace Isaac 12/16/2018 9:08 AM

## 2018-12-16 NOTE — Progress Notes (Signed)
Patient ID: Mary Farrell, female   DOB: [birth year], 73 y.o.   MRN: [id number] EVENING ROUNDS NOTE :     [address]       [place] [address]             [phone]                 3 Days Post-Op Procedure(s) (LRB): MINIMALLY INVASIVE MITRAL VALVE (MV) REPLACEMENT using a Magna Mitral Ease 31 MM Valve. (Right) MINIMALLY INVASIVE MAZE PROCEDURE using a 45 MM AtriClip. (N/A) TRANSESOPHAGEAL ECHOCARDIOGRAM (TEE) (N/A)  Total Length of Stay:  LOS: 3 days  BP (!) 154/116   Pulse 70   Temp 98.8 F (37.1 C) (Oral)   Resp 17   Ht 5' (1.524 m)   Wt 72.6 kg Comment: standing   SpO2 100%   BMI 31.26 kg/m   .Intake/Output      11/21 0701 - 11/22 0700   P.O. 250   I.V. (mL/kg) 206.7 (2.8)   IV Piggyback    Total Intake(mL/kg) 456.7 (6.3)   Urine (mL/kg/hr) 800 (0.9)   Chest Tube 150   Total Output 950   Net -493.3       Urine Occurrence 1 x     . sodium chloride    . sodium chloride 10 mL/hr at 12/16/18 1700     Lab Results  Component Value Date   WBC 12.0 (H) 12/16/2018   HGB 9.1 (L) 12/16/2018   HCT 28.1 (L) 12/16/2018   PLT 116 (L) 12/16/2018   GLUCOSE 95 12/16/2018   ALT 19 12/11/2018   AST 23 12/11/2018   NA 137 12/16/2018   K 4.0 12/16/2018   CL 102 12/16/2018   CREATININE 0.56 12/16/2018   BUN 14 12/16/2018   CO2 26 12/16/2018   TSH 1.560 09/18/2015   INR 1.9 (H) 12/16/2018   HGBA1C 5.3 12/11/2018   Up in chair  Stable day Pacer dependant  Grace Isaac MD  Beeper [phone] Office [phone] 12/16/2018 7:04 PM

## 2018-12-16 NOTE — Progress Notes (Signed)
   12/15/18 2215  Vitals  BP (!) 164/52  MAP (mmHg) 84  Pulse Rate 70  ECG Heart Rate 70  Cardiac Rhythm Atrial paced  Resp (!) 21  Oxygen Therapy  SpO2 97 %  Neurological  Level of Consciousness Alert  Orientation Level Oriented to person;Oriented to place  Cognition Impulsive;Poor judgement;Poor safety awareness  Speech Clear  R Pupil Size (mm) 3  R Pupil Shape Round  R Pupil Reaction Brisk  L Pupil Size (mm) 3  L Pupil Shape Round  L Pupil Reaction Brisk  R Hand Grip Moderate;Present  L Hand Grip Present;Moderate   RUE Motor Response Responds to commands;Purposeful movement  LUE Motor Response Responds to commands;Purposeful movement  RLE Motor Response Responds to commands;Purposeful movement  LLE Motor Response Responds to commands;Purposeful movement  Neuro Symptoms Forgetful  Musculoskeletal  Musculoskeletal (WDL) X  Generalized Weakness Yes (no change from previous assessment noted)  Integumentary  Skin Integrity Other (Comment) (left knee reddened)

## 2018-12-17 ENCOUNTER — Inpatient Hospital Stay (HOSPITAL_COMMUNITY): Payer: Medicare Other

## 2018-12-17 LAB — BASIC METABOLIC PANEL
Anion gap: 7 (ref 5–15)
BUN: 15 mg/dL (ref 8–23)
CO2: 31 mmol/L (ref 22–32)
Calcium: 8 mg/dL — ABNORMAL LOW (ref 8.9–10.3)
Chloride: 100 mmol/L (ref 98–111)
Creatinine, Ser: 0.49 mg/dL (ref 0.44–1.00)
GFR calc Af Amer: >60 mL/min (ref >60)
GFR calc non Af Amer: >60 mL/min (ref >60)
Glucose, Bld: 92 mg/dL (ref 70–99)
Potassium: 3.2 mmol/L — ABNORMAL LOW (ref 3.5–5.1)
Sodium: 138 mmol/L (ref 135–145)

## 2018-12-17 LAB — CBC
HCT: 28 % — ABNORMAL LOW (ref 36.0–46.0)
Hemoglobin: 9.1 g/dL — ABNORMAL LOW (ref 12.0–15.0)
MCH: 30.7 pg (ref 26.0–34.0)
MCHC: 32.5 g/dL (ref 30.0–36.0)
MCV: 94.6 fL (ref 80.0–100.0)
Platelets: 130 10*3/uL — ABNORMAL LOW (ref 150–400)
RBC: 2.96 MIL/uL — ABNORMAL LOW (ref 3.87–5.11)
RDW: 14.6 % (ref 11.5–15.5)
WBC: 9 10*3/uL (ref 4.0–10.5)
nRBC: 0 % (ref 0.0–0.2)

## 2018-12-17 LAB — PROTIME-INR
INR: 2.1 — ABNORMAL HIGH (ref 0.8–1.2)
Prothrombin Time: 23.5 seconds — ABNORMAL HIGH (ref 11.4–15.2)

## 2018-12-17 MED ORDER — POTASSIUM CHLORIDE CRYS ER 20 MEQ PO TBCR
20.0000 meq | EXTENDED_RELEASE_TABLET | ORAL | Status: AC
  Administered 2018-12-17 (×3): 20 meq via ORAL
  Filled 2018-12-17 (×3): qty 1

## 2018-12-17 NOTE — Progress Notes (Signed)
Patient ID: Mary Farrell, female   DOB: 25-Jul-1945, 73 y.o.   MRN: [id number] [facility] DAILY ICU PROGRESS NOTE                   [address]            [place] [address]          [phone]   4 Days Post-Op Procedure(s) (LRB): MINIMALLY INVASIVE MITRAL VALVE (MV) REPLACEMENT using a Magna Mitral Ease 31 MM Valve. (Right) MINIMALLY INVASIVE MAZE PROCEDURE using a 45 MM AtriClip. (N/A) TRANSESOPHAGEAL ECHOCARDIOGRAM (TEE) (N/A)  Total Length of Stay:  LOS: 4 days   Subjective: Mildly confused at night, this morning awake alert neurologically intact  Objective: Vital signs in last 24 hours: Temp:  [98.5 F (36.9 C)-99.2 F (37.3 C)] 99.2 F (37.3 C) (11/22 0700) Pulse Rate:  [69-76] 71 (11/22 0900) Cardiac Rhythm: Atrial paced (11/22 0800) Resp:  [11-25] 24 (11/22 0900) BP: (127-167)/(52-121) 128/86 (11/22 0800) SpO2:  [93 %-100 %] 93 % (11/22 0900) Weight:  [71.4 kg] 71.4 kg (11/22 0500)  Filed Weights   12/15/18 0500 12/16/18 0800 12/17/18 0500  Weight: 75.1 kg 72.6 kg 71.4 kg    Weight change:    Hemodynamic parameters for last 24 hours:    Intake/Output from previous day: 11/21 0701 - 11/22 0700 In: 591.2 [P.O.:250; I.V.:341.2] Out: 2000 [Urine:1800; Chest Tube:200]  Intake/Output this shift: Total I/O In: 20 [I.V.:20] Out: 0   Current Meds: Scheduled Meds: . acetaminophen  1,000 mg Oral Q6H  . aspirin EC  81 mg Oral Daily  . bisacodyl  10 mg Oral Daily   Or  . bisacodyl  10 mg Rectal Daily  . chlorhexidine gluconate (MEDLINE KIT)  15 mL Mouth Rinse BID  . Chlorhexidine Gluconate Cloth  6 each Topical q morning - 10a  . docusate sodium  200 mg Oral Daily  . dorzolamide-timolol  1 drop Both Eyes Daily  . famotidine  20 mg Oral QHS  . furosemide  40 mg Intravenous BID  . gabapentin  300 mg Oral TID  . latanoprost  1 drop Both Eyes QHS  . losartan  25 mg Oral Daily  . potassium chloride  20 mEq Oral Q4H  . warfarin  2.5 mg Oral q1800   . Warfarin - Physician Dosing Inpatient   Does not apply q1800   Continuous Infusions: . sodium chloride    . sodium chloride 10 mL/hr at 12/17/18 0900   PRN Meds:.sodium chloride, ALPRAZolam, doxepin, metoprolol tartrate, ondansetron (ZOFRAN) IV, oxyCODONE, sodium chloride flush, sodium chloride flush, traMADol  General appearance: alert, cooperative and no distress Neurologic: intact Heart: regular rate and rhythm, S1, S2 normal, no murmur, click, rub or gallop Lungs: diminished breath sounds bibasilar Abdomen: soft, non-tender; bowel sounds normal; no masses,  no organomegaly Extremities: extremities normal, atraumatic, no cyanosis or edema and Homans sign is negative, no sign of DVT Wound: 50 mL from chest tubes over past 12 hours  Lab Results: CBC: Recent Labs    12/16/18 0450 12/17/18 0334  WBC 12.0* 9.0  HGB 9.1* 9.1*  HCT 28.1* 28.0*  PLT 116* 130*   BMET:  Recent Labs    12/16/18 0450 12/17/18 0334  NA 137 138  K 4.0 3.2*  CL 102 100  CO2 26 31  GLUCOSE 95 92  BUN 14 15  CREATININE 0.56 0.49  CALCIUM 7.8* 8.0*    CMET: Lab Results  Component Value Date  WBC 9.0 12/17/2018   HGB 9.1 (L) 12/17/2018   HCT 28.0 (L) 12/17/2018   PLT 130 (L) 12/17/2018   GLUCOSE 92 12/17/2018   ALT 19 12/11/2018   AST 23 12/11/2018   NA 138 12/17/2018   K 3.2 (L) 12/17/2018   CL 100 12/17/2018   CREATININE 0.49 12/17/2018   BUN 15 12/17/2018   CO2 31 12/17/2018   TSH 1.560 09/18/2015   INR 2.1 (H) 12/17/2018   HGBA1C 5.3 12/11/2018      PT/INR:  Recent Labs    12/17/18 0334  LABPROT 23.5*  INR 2.1*   Radiology: Dg Chest Port 1 View In Am  Result Date: 12/17/2018 CLINICAL DATA:  Mitral valve replacement. EXAM: PORTABLE CHEST 1 VIEW COMPARISON:  12/15/2018 FINDINGS: Slight leftward patient rotation. The cardiopericardial silhouette is within normal limits for size. Retrocardiac collapse/consolidation with effusion is stable. Improved aeration right base  with peripheral right mid lung opacity, likely a combination of volume loss and pleural fluid. Right chest tube remains in place without evidence for pneumothorax. Pericardial drain again noted, stable. Left IJ central line tip is stable overlying the upper left mediastinum. IMPRESSION: 1. Interval improvement in aeration at the right lung base. 2. Similar retrocardiac collapse/consolidation with effusion. Electronically Signed   By: Misty Stanley M.D.   On: 12/17/2018 09:48     Assessment/Plan: S/P Procedure(s) (LRB): MINIMALLY INVASIVE MITRAL VALVE (MV) REPLACEMENT using a Magna Mitral Ease 31 MM Valve. (Right) MINIMALLY INVASIVE MAZE PROCEDURE using a 45 MM AtriClip. (N/A) TRANSESOPHAGEAL ECHOCARDIOGRAM (TEE) (N/A) Mobilize Diuresis d/c tubes/lines On Coumadin 2.5 mg INR 2.1 today 50 mL from chest tubes over past 12 hours DC today Remove central line in a.m. Today patient in sinus rhythm 70-72 with pacemaker off    Grace Isaac 12/17/2018 10:18 AM

## 2018-12-17 NOTE — Anesthesia Postprocedure Evaluation (Signed)
Anesthesia Post Note  Patient: Mary Farrell  Procedure(s) Performed: MINIMALLY INVASIVE MITRAL VALVE (MV) REPLACEMENT using a Magna Mitral Ease 31 MM Valve. (Right Chest) MINIMALLY INVASIVE MAZE PROCEDURE using a 45 MM AtriClip. (N/A ) TRANSESOPHAGEAL ECHOCARDIOGRAM (TEE) (N/A )     Patient location during evaluation: SICU Anesthesia Type: General Level of consciousness: sedated Pain management: pain level controlled Vital Signs Assessment: post-procedure vital signs reviewed and stable Respiratory status: patient remains intubated per anesthesia plan Cardiovascular status: stable Postop Assessment: no apparent nausea or vomiting Anesthetic complications: no    Last Vitals:  Vitals:   12/17/18 1700 12/17/18 1800  BP:  (!) 129/55  Pulse:  77  Resp: 18 20  Temp:    SpO2:  97%    Last Pain:  Vitals:   12/17/18 1600  TempSrc: Oral  PainSc: 2                  Mansel Strother

## 2018-12-17 NOTE — Progress Notes (Signed)
Patient ID: Mary Farrell, female   DOB: Jul 04, 1945, 73 y.o.   MRN: [id number] EVENING ROUNDS NOTE :     [address]       [place] [address]             [phone]                 4 Days Post-Op Procedure(s) (LRB): MINIMALLY INVASIVE MITRAL VALVE (MV) REPLACEMENT using a Magna Mitral Ease 31 MM Valve. (Right) MINIMALLY INVASIVE MAZE PROCEDURE using a 45 MM AtriClip. (N/A) TRANSESOPHAGEAL ECHOCARDIOGRAM (TEE) (N/A)  Total Length of Stay:  LOS: 4 days  BP (!) 129/55 (BP Location: Left Arm)   Pulse 77   Temp 98.3 F (36.8 C)   Resp 20   Ht 5' (1.524 m)   Wt 71.4 kg   SpO2 97%   BMI 30.74 kg/m   .Intake/Output      11/21 0701 - 11/22 0700 11/22 0701 - 11/23 0700   P.O. 250    I.V. (mL/kg) 341.2 (4.8) 106 (1.5)   IV Piggyback     Total Intake(mL/kg) 591.2 (8.3) 106 (1.5)   Urine (mL/kg/hr) 1800 (1.1) 800 (1)   Stool 0 0   Chest Tube 200 0   Total Output 2000 800   Net -1408.9 -694        Urine Occurrence 1 x 1 x   Stool Occurrence 1 x 1 x     . sodium chloride    . sodium chloride 10 mL/hr at 12/17/18 1800     Lab Results  Component Value Date   WBC 9.0 12/17/2018   HGB 9.1 (L) 12/17/2018   HCT 28.0 (L) 12/17/2018   PLT 130 (L) 12/17/2018   GLUCOSE 92 12/17/2018   ALT 19 12/11/2018   AST 23 12/11/2018   NA 138 12/17/2018   K 3.2 (L) 12/17/2018   CL 100 12/17/2018   CREATININE 0.49 12/17/2018   BUN 15 12/17/2018   CO2 31 12/17/2018   TSH 1.560 09/18/2015   INR 2.1 (H) 12/17/2018   HGBA1C 5.3 12/11/2018   Pacer off , sinus 77 Chest tubes out On coumadin, lovenox off  stable   Grace Isaac MD  Beeper [phone] Office [phone] 12/17/2018 6:12 PM

## 2018-12-18 ENCOUNTER — Inpatient Hospital Stay (HOSPITAL_COMMUNITY): Payer: Medicare Other

## 2018-12-18 LAB — PROTIME-INR
INR: 2.3 — ABNORMAL HIGH (ref 0.8–1.2)
Prothrombin Time: 24.8 seconds — ABNORMAL HIGH (ref 11.4–15.2)

## 2018-12-18 LAB — CBC
HCT: 27.7 % — ABNORMAL LOW (ref 36.0–46.0)
Hemoglobin: 8.8 g/dL — ABNORMAL LOW (ref 12.0–15.0)
MCH: 31 pg (ref 26.0–34.0)
MCHC: 31.8 g/dL (ref 30.0–36.0)
MCV: 97.5 fL (ref 80.0–100.0)
Platelets: 144 10*3/uL — ABNORMAL LOW (ref 150–400)
RBC: 2.84 MIL/uL — ABNORMAL LOW (ref 3.87–5.11)
RDW: 14.6 % (ref 11.5–15.5)
WBC: 5.6 10*3/uL (ref 4.0–10.5)
nRBC: 0.4 % — ABNORMAL HIGH (ref 0.0–0.2)

## 2018-12-18 LAB — BASIC METABOLIC PANEL
Anion gap: 4 — ABNORMAL LOW (ref 5–15)
BUN: 13 mg/dL (ref 8–23)
CO2: 32 mmol/L (ref 22–32)
Calcium: 7.9 mg/dL — ABNORMAL LOW (ref 8.9–10.3)
Chloride: 102 mmol/L (ref 98–111)
Creatinine, Ser: 0.55 mg/dL (ref 0.44–1.00)
GFR calc Af Amer: >60 mL/min (ref >60)
GFR calc non Af Amer: >60 mL/min (ref >60)
Glucose, Bld: 100 mg/dL — ABNORMAL HIGH (ref 70–99)
Potassium: 3.3 mmol/L — ABNORMAL LOW (ref 3.5–5.1)
Sodium: 138 mmol/L (ref 135–145)

## 2018-12-18 MED ORDER — POTASSIUM CHLORIDE CRYS ER 20 MEQ PO TBCR: 40.0000 meq | EXTENDED_RELEASE_TABLET | ORAL | Status: DC

## 2018-12-18 MED ORDER — POTASSIUM CHLORIDE CRYS ER 20 MEQ PO TBCR
20.0000 meq | EXTENDED_RELEASE_TABLET | ORAL | Status: DC
  Administered 2018-12-18: 20 meq via ORAL
  Filled 2018-12-18: qty 1

## 2018-12-18 MED ORDER — FUROSEMIDE 20 MG PO TABS: 20.0000 mg | ORAL_TABLET | Freq: Every day | ORAL | Status: DC

## 2018-12-18 MED ORDER — FUROSEMIDE 40 MG PO TABS
40.0000 mg | ORAL_TABLET | Freq: Every day | ORAL | Status: DC
  Administered 2018-12-19 – 2018-12-20 (×2): 40 mg via ORAL
  Filled 2018-12-18 (×2): qty 1

## 2018-12-18 MED ORDER — POTASSIUM CHLORIDE CRYS ER 20 MEQ PO TBCR
20.0000 meq | EXTENDED_RELEASE_TABLET | Freq: Every day | ORAL | Status: DC
  Administered 2018-12-19 – 2018-12-20 (×2): 20 meq via ORAL
  Filled 2018-12-18 (×2): qty 1

## 2018-12-18 MED ORDER — POTASSIUM CHLORIDE 10 MEQ/50ML IV SOLN
10.0000 meq | INTRAVENOUS | Status: AC
  Administered 2018-12-18 (×5): 10 meq via INTRAVENOUS
  Filled 2018-12-18 (×5): qty 50

## 2018-12-18 MED ORDER — HYDROCHLOROTHIAZIDE 12.5 MG PO CAPS: 12.5000 mg | ORAL_CAPSULE | Freq: Every day | ORAL | Status: DC

## 2018-12-18 MED ORDER — FLECAINIDE ACETATE 50 MG PO TABS
100.0000 mg | ORAL_TABLET | Freq: Two times a day (BID) | ORAL | Status: DC
  Administered 2018-12-18 – 2018-12-20 (×5): 100 mg via ORAL
  Filled 2018-12-18 (×5): qty 2

## 2018-12-18 MED FILL — Heparin Sodium (Porcine) Inj 1000 Unit/ML: INTRAMUSCULAR | Qty: 30 | Status: AC

## 2018-12-18 MED FILL — Potassium Chloride Inj 2 mEq/ML: INTRAVENOUS | Qty: 40 | Status: AC

## 2018-12-18 MED FILL — Lidocaine HCl Local Preservative Free (PF) Inj 2%: INTRAMUSCULAR | Qty: 15 | Status: AC

## 2018-12-18 NOTE — Progress Notes (Signed)
      [address]       [place] [address]             [phone]    POD # 5 MVR maze   Up in chair BP (!) 105/49   Pulse 81   Temp 98.3 F (36.8 C) (Oral)   Resp 17   Ht 5' (1.524 m)   Wt 70.5 kg   SpO2 96%   BMI 30.35 kg/m  In SR currently  Intake/Output Summary (Last 24 hours) at 12/18/2018 1729 Last data filed at 12/18/2018 1600 Gross per 24 hour  Intake 452.81 ml  Output 1100 ml  Net -647.19 ml   Remo Lipps C. Roxan Hockey, MD [facility] [phone]

## 2018-12-18 NOTE — Progress Notes (Addendum)
Physical Therapy Treatment Patient Details Name: Mary Farrell MRN: [id number] DOB: [birth year] Today's Date: 12/18/2018    History of Present Illness 73 year old female with likely rheumatic heart disease including mitral stenosis, aortic insufficiency, and recurrent paroxysmal atrial fibrillation and atrial flutter who describes a long history of symptoms consistent with chronic diastolic congestive heart failure. PT with severe symptomatic mitral valve stenosis and recurrent paroxysmal afib, now s/p minimally invasive mitral valve replacement on 11/18, pacer dependent at this time.     PT Comments    Patient progressing well towards PT goals. Improved ambulation distance with Min A for balance/safety with use of Eva walker. VSS throughout on RA. Remained in NSR in 80-90s during activity. Reports feeling tired today after going in/out of A-fib. Requires Min A for all mobility due to posterior lean. Able to adhere to sternal precautions during mobility today. Encouraged walking with nursing three times daily. Will likely progress to being able to return home with spouse. Will follow.   Follow Up Recommendations  CIR;Supervision/Assistance - 24 hour pending progress     Equipment Recommendations  Rolling walker with 5" wheels    Recommendations for Other Services       Precautions / Restrictions Precautions Precautions: Fall Precaution Comments: external pacer Restrictions Weight Bearing Restrictions: Yes Other Position/Activity Restrictions: sternal precautions    Mobility  Bed Mobility               General bed mobility comments: Sitting in chair upon PT arrival.  Transfers Overall transfer level: Needs assistance Equipment used: [place] walker) Transfers: Sit to/from [employer] to Stand: Min assist Stand pivot transfers: Min assist       General transfer comment: Assist to power to standing with cues to hold heart pillow and use of momentum;  posterior bias and LOB backwards onto chair on first attempt; stood from chair x2, SPT chair to/from [facility]  Ambulation/Gait Ambulation/Gait assistance: Min assist Gait Distance (Feet): 120 Feet Assistive device: (Eva walker) Gait Pattern/deviations: Step-through pattern;Decreased stride length;Narrow base of support Gait velocity: decreased   General Gait Details: Slow, mildly unsteady gait; fatigues. Min A for balance and to navigate walker.   Stairs             Wheelchair Mobility    Modified Rankin (Stroke Patients Only)       Balance Overall balance assessment: Needs assistance Sitting-balance support: Feet supported;No upper extremity supported Sitting balance-Leahy Scale: Fair Sitting balance - Comments: ABle to get to socks sitting in chair   Standing balance support: During functional activity Standing balance-Leahy Scale: Poor Standing balance comment: Requires external support vs UE support for standing balance; able to perform pericare with Min guard-Min A                            Cognition Arousal/Alertness: Awake/alert Behavior During Therapy: WFL for tasks assessed/performed Overall Cognitive Status: Within Functional Limits for tasks assessed                                        Exercises      General Comments General comments (skin integrity, edema, etc.): VSS, pre activity BP 87/72, BP during mobility 110/57. Not currently being paced. HR stable in 80s-90s.      Pertinent Vitals/Pain Pain Assessment: Faces Faces Pain Scale: Hurts even more Pain Location: spot on  posterior right shoulder Pain Descriptors / Indicators: Tender Pain Intervention(s): Monitored during session;Repositioned    Home Living                      Prior Function            PT Goals (current goals can now be found in the care plan section) Progress towards PT goals: Progressing toward goals    Frequency    Min  3X/week      PT Plan Current plan remains appropriate    Co-evaluation PT/OT/SLP Co-Evaluation/Treatment: Yes Reason for Co-Treatment: For patient/therapist safety;To address functional/ADL transfers          AM-PAC PT "6 Clicks" Mobility   Outcome Measure  Help needed turning from your back to your side while in a flat bed without using bedrails?: A Little Help needed moving from lying on your back to sitting on the side of a flat bed without using bedrails?: A Little Help needed moving to and from a bed to a chair (including a wheelchair)?: A Little Help needed standing up from a chair using your arms (e.g., wheelchair or bedside chair)?: A Little Help needed to walk in hospital room?: A Little Help needed climbing 3-5 steps with a railing? : A Lot 6 Click Score: 17    End of Session Equipment Utilized During Treatment: Gait belt Activity Tolerance: Patient tolerated treatment well Patient left: in chair;with call bell/phone within reach;with family/visitor present Nurse Communication: Mobility status PT Visit Diagnosis: Unsteadiness on feet (R26.81)     Time: [id number] PT Time Calculation (min) (ACUTE ONLY): 39 min  Charges:  $Gait Training: 8-22 mins $Therapeutic Activity: 8-22 mins                     Marisa Severin, PT, DPT Acute Rehabilitation Services Pager [phone] Office [phone]       Mary Farrell 12/18/2018, 2:44 PM

## 2018-12-18 NOTE — Progress Notes (Signed)
Rehab Admissions Coordinator Note:  Patient was screened by Cleatrice Burke for appropriateness for an Inpatient Acute Rehab Consult per PT recs.   At this time, we are recommending Inpatient Rehab consult. Please place order for consult if pt would like to be considered for admit. Please advise.   Cleatrice Burke RN MSN 12/18/2018, 8:20 AM  I can be reached at [phone].

## 2018-12-18 NOTE — Progress Notes (Addendum)
Pt HR in 30-40's while sitting at bsc. Turned pacer back on AAI rate 70. Pt returned back to Afib 70s-80s thereafter. Pacer currently turned off. Notified Dr. Roxy Manns, will not proceed with pacing wires removal.   [name] 12/18/2018 11:40h

## 2018-12-18 NOTE — Progress Notes (Signed)
Patient placed on nasal CPAP tolerating well.

## 2018-12-18 NOTE — Progress Notes (Addendum)
[address]       [place] [address]             [phone]      5 Days Post-Op Procedure(s) (LRB): MINIMALLY INVASIVE MITRAL VALVE (MV) REPLACEMENT using a Magna Mitral Ease 31 MM Valve. (Right) MINIMALLY INVASIVE MAZE PROCEDURE using a 45 MM AtriClip. (N/A) TRANSESOPHAGEAL ECHOCARDIOGRAM (TEE) (N/A) Subjective: C/o some nausea   Objective: Vital signs in last 24 hours: Temp:  [98.3 F (36.8 C)-99.3 F (37.4 C)] 98.5 F (36.9 C) (11/23 0400) Pulse Rate:  [71-88] 88 (11/23 0651) Cardiac Rhythm: Heart block (11/23 0400) Resp:  [11-24] 12 (11/23 0651) BP: (103-145)/(47-128) 128/61 (11/23 0600) SpO2:  [92 %-100 %] 96 % (11/23 0651) Weight:  [70.5 kg] 70.5 kg (11/23 0651)  Hemodynamic parameters for last 24 hours:    Intake/Output from previous day: 11/22 0701 - 11/23 0700 In: 106 [I.V.:106] Out: 1200 [Urine:1200] Intake/Output this shift: No intake/output data recorded.  General appearance: alert, cooperative and no distress Heart: irregularly irregular rhythm and no murmur, + tachy Lungs: dim in lower fields Abdomen: benign Extremities: min edema Wound: incis healing well  Lab Results: Recent Labs    12/17/18 0334 12/18/18 0523  WBC 9.0 5.6  HGB 9.1* 8.8*  HCT 28.0* 27.7*  PLT 130* 144*   BMET:  Recent Labs    12/17/18 0334 12/18/18 0523  NA 138 138  K 3.2* 3.3*  CL 100 102  CO2 31 32  GLUCOSE 92 100*  BUN 15 13  CREATININE 0.49 0.55  CALCIUM 8.0* 7.9*    PT/INR:  Recent Labs    12/18/18 0523  LABPROT 24.8*  INR 2.3*   ABG    Component Value Date/Time   PHART 7.372 12/14/2018 0537   HCO3 22.0 12/14/2018 0537   TCO2 23 12/14/2018 0537   ACIDBASEDEF 3.0 (H) 12/14/2018 0537   O2SAT 99.0 12/14/2018 0537   CBG (last 3)  Recent Labs    12/15/18 0814 12/16/18 0733 12/16/18 1047  GLUCAP 105* 95 122*    Meds Scheduled Meds: . acetaminophen  1,000 mg Oral Q6H  . aspirin EC  81 mg Oral Daily  . bisacodyl   10 mg Oral Daily   Or  . bisacodyl  10 mg Rectal Daily  . chlorhexidine gluconate (MEDLINE KIT)  15 mL Mouth Rinse BID  . Chlorhexidine Gluconate Cloth  6 each Topical q morning - 10a  . docusate sodium  200 mg Oral Daily  . dorzolamide-timolol  1 drop Both Eyes Daily  . famotidine  20 mg Oral QHS  . furosemide  40 mg Intravenous BID  . gabapentin  300 mg Oral TID  . latanoprost  1 drop Both Eyes QHS  . losartan  25 mg Oral Daily  . potassium chloride  20 mEq Oral Q4H  . warfarin  2.5 mg Oral q1800  . Warfarin - Physician Dosing Inpatient   Does not apply q1800   Continuous Infusions: . sodium chloride    . sodium chloride 10 mL/hr at 12/17/18 1800   PRN Meds:.sodium chloride, ALPRAZolam, doxepin, metoprolol tartrate, ondansetron (ZOFRAN) IV, oxyCODONE, sodium chloride flush, sodium chloride flush, traMADol  Xrays Dg Chest Port 1 View In Am  Result Date: 12/17/2018 CLINICAL DATA:  Mitral valve replacement. EXAM: PORTABLE CHEST 1 VIEW COMPARISON:  12/15/2018 FINDINGS: Slight leftward patient rotation. The cardiopericardial silhouette is within normal limits for size. Retrocardiac collapse/consolidation with effusion is stable. Improved aeration right base with  peripheral right mid lung opacity, likely a combination of volume loss and pleural fluid. Right chest tube remains in place without evidence for pneumothorax. Pericardial drain again noted, stable. Left IJ central line tip is stable overlying the upper left mediastinum. IMPRESSION: 1. Interval improvement in aeration at the right lung base. 2. Similar retrocardiac collapse/consolidation with effusion. Electronically Signed   By: Misty Stanley M.D.   On: 12/17/2018 09:48    Assessment/Plan: S/P Procedure(s) (LRB): MINIMALLY INVASIVE MITRAL VALVE (MV) REPLACEMENT using a Magna Mitral Ease 31 MM Valve. (Right) MINIMALLY INVASIVE MAZE PROCEDURE using a 45 MM AtriClip. (N/A) TRANSESOPHAGEAL ECHOCARDIOGRAM (TEE) (N/A)  1 conts to  improve overall POD# 5 2 while examining she had some afib /sinus tachy- will need to consider restart Tambocor, beta blocker- says she didn't tol amiodarone in past. K+ being replaced. INR is 2.3- cont coumadin.  3 sats good on RA, wears CPAP at night 4 good UOP, volume status improving- may be able to decrease dosing soon 5 normal renal fxn 6 ABL anemia is pretty stable 7 thrombocytopenia conts to improve 8 CXR appearance conts to show improved aeration 9 cont routine pulm toilet/ cardiac rehab 10 poss d/c EPW's soon   LOS: 5 days    John Giovanni [facility] 12/18/2018 Pager [phone]- not for patient use   I have seen and examined the patient and agree with the assessment and plan as outlined.  Going in and out of rate-controlled Afib at present.  Will restart flecainide.  D/C pacing wires.  Decrease lasix.  Supplement potassium w/ IV today due to nausea.  Transfer 4E.  Possible D/C to CIR 1-2 days if bed available.  Rexene Alberts, MD 12/18/2018 9:42 AM

## 2018-12-18 NOTE — Progress Notes (Addendum)
Inpatient Rehabilitation Admissions Coordinator  Inpatient rehab consult received. I came to meet with pt, but PT and OT in to treat. I will return this afternoon.  Danne Baxter, RN, MSN Rehab Admissions Coordinator [phone] 12/18/2018 1:57 PM  I met with patient and her spouse at bedside for rehab assessment. We discussed goals and expectations of an inpt rehab admit and they are in agreement. I will begin insurance authorization with [facility] Medicare for a possible admit pending insurance approval when pt medically ready.  Danne Baxter, RN, MSN Rehab Admissions Coordinator [phone] 12/18/2018 3:20 PM

## 2018-12-18 NOTE — Evaluation (Signed)
Occupational Therapy Evaluation Patient Details Name: Mary Farrell MRN: [id number] DOB: 12/04/1945 Today's Date: 12/18/2018    History of Present Illness 73 year old female with likely rheumatic heart disease including mitral stenosis, aortic insufficiency, and recurrent paroxysmal atrial fibrillation and atrial flutter who describes a long history of symptoms consistent with chronic diastolic congestive heart failure. PT with severe symptomatic mitral valve stenosis and recurrent paroxysmal afib, now s/p minimally invasive mitral valve replacement on 11/18, pacer dependent at this time.    Clinical Impression   PTA patient independent. Admitted for above and limited by problem list below, including generalized weakness, impaired balance, decreased activity tolerance.  Patient currently requires setup to min assist for ADLs, transfers and mobility using EVA walker due to posterior lean and mild unsteadiness. She fatigues easily and requires increased time for tasks. Further education needed for sternal precautions and ADLs.  Will follow acutely, believe she will benefit from continued OT services while admitted and after dc at CIR level in order to maximize independence and return her to an independent level with ADLs/mobility.     Follow Up Recommendations  Supervision/Assistance - 24 hour;CIR(pending progress)    Equipment Recommendations  3 in 1 bedside commode    Recommendations for Other Services       Precautions / Restrictions Precautions Precautions: Fall Precaution Comments: external pacer Restrictions Weight Bearing Restrictions: Yes Other Position/Activity Restrictions: sternal precautions      Mobility Bed Mobility               General bed mobility comments: OOB in chair upon arrival  Transfers Overall transfer level: Needs assistance Equipment used: (eva walker ) Transfers: Sit to/from [facility] Sit to Stand: Min assist Stand pivot  transfers: Min assist       General transfer comment: min assist to power up and steady, noted posterior lean on first attempt     Balance Overall balance assessment: Needs assistance Sitting-balance support: Feet supported;No upper extremity supported Sitting balance-Leahy Scale: Fair Sitting balance - Comments: ABle to get to socks sitting in chair   Standing balance support: Bilateral upper extremity supported;During functional activity Standing balance-Leahy Scale: Poor Standing balance comment: Requires external support vs UE support for standing balance; able to perform pericare with Min guard-Min A                           ADL either performed or assessed with clinical judgement   ADL Overall ADL's : Needs assistance/impaired     Grooming: Set up;Sitting   Upper Body Bathing: Minimal assistance;Sitting   Lower Body Bathing: Minimal assistance;Sit to/from stand   Upper Body Dressing : Minimal assistance;Sitting   Lower Body Dressing: Minimal assistance;Sit to/from stand Lower Body Dressing Details (indicate cue type and reason): able to manage socks seated with ease ( figure 4 technique), min assist in standing  Toilet Transfer: Minimal assistance;Ambulation Toilet Transfer Details (indicate cue type and reason): eva walker to [facility]  Toileting- Clothing Manipulation and Hygiene: Minimal assistance;Sit to/from stand       Functional mobility during ADLs: Minimal assistance;+2 for safety/equipment(eva walker ) General ADL Comments: pt limited by weakness, impaired balance, decreased activity tolerance     Vision   Vision Assessment?: No apparent visual deficits     Perception     Praxis      Pertinent Vitals/Pain Pain Assessment: Faces Faces Pain Scale: Hurts even more Pain Location: spot on posterior right shoulder Pain Descriptors /  Indicators: Tender Pain Intervention(s): Monitored during session;Repositioned     Hand Dominance      Extremity/Trunk Assessment Upper Extremity Assessment Upper Extremity Assessment: Generalized weakness   Lower Extremity Assessment Lower Extremity Assessment: Defer to PT evaluation   Cervical / Trunk Assessment Cervical / Trunk Assessment: Normal   Communication Communication Communication: No difficulties   Cognition Arousal/Alertness: Awake/alert Behavior During Therapy: WFL for tasks assessed/performed Overall Cognitive Status: Within Functional Limits for tasks assessed                                     General Comments  VSS; BP pre 87/82 post 110/57; HR 80-90s (not being paced currently)    Exercises     Shoulder Instructions      Home Living Family/patient expects to be discharged to:: Private residence Living Arrangements: Spouse/significant other Available Help at Discharge: Family;Available 24 hours/day Type of Home: House Home Access: Stairs to enter [employer] of Steps: 3 Entrance Stairs-Rails: Right Home Layout: One level     Bathroom Shower/Tub: Teacher, early years/pre: Handicapped height     Home Equipment: [place] - single point          Prior Functioning/Environment Level of Independence: Independent        Comments: independent ADLs, IADLs         OT Problem List: Decreased activity tolerance;Impaired balance (sitting and/or standing);Decreased knowledge of use of DME or AE;Decreased knowledge of precautions;Cardiopulmonary status limiting activity      OT Treatment/Interventions: Self-care/ADL training;DME and/or AE instruction;Energy conservation;Therapeutic activities;Patient/family education;Balance training    OT Goals(Current goals can be found in the care plan section) Acute Rehab OT Goals Patient Stated Goal: to get stronger and get back home  OT Goal Formulation: With patient Time For Goal Achievement: 01/01/19 Potential to Achieve Goals: Good  OT Frequency: Min 2X/week   Barriers to  D/C:            Co-evaluation PT/OT/SLP Co-Evaluation/Treatment: Yes Reason for Co-Treatment: For patient/therapist safety;To address functional/ADL transfers   OT goals addressed during session: ADL's and self-care      AM-PAC OT "6 Clicks" Daily Activity     Outcome Measure Help from another person eating meals?: None Help from another person taking care of personal grooming?: A Little Help from another person toileting, which includes using toliet, bedpan, or urinal?: A Little Help from another person bathing (including washing, rinsing, drying)?: A Little Help from another person to put on and taking off regular upper body clothing?: A Little Help from another person to put on and taking off regular lower body clothing?: A Little 6 Click Score: 19   End of Session Equipment Utilized During Treatment: Gait belt;Other (comment)(eva walker ) Nurse Communication: Mobility status  Activity Tolerance: Patient tolerated treatment well Patient left: in chair;with call bell/phone within reach;with chair alarm set;with family/visitor present  OT Visit Diagnosis: Other abnormalities of gait and mobility (R26.89);Muscle weakness (generalized) (M62.81)                Time: [id number] OT Time Calculation (min): 40 min Charges:  OT General Charges $OT Visit: 1 Visit OT Evaluation $OT Eval Moderate Complexity: [place], OT Acute Rehabilitation Services Pager [phone] Office [phone]   Delight Stare 12/18/2018, 4:24 PM

## 2018-12-19 LAB — BASIC METABOLIC PANEL
Anion gap: 8 (ref 5–15)
BUN: 13 mg/dL (ref 8–23)
CO2: 30 mmol/L (ref 22–32)
Calcium: 8.6 mg/dL — ABNORMAL LOW (ref 8.9–10.3)
Chloride: 101 mmol/L (ref 98–111)
Creatinine, Ser: 0.53 mg/dL (ref 0.44–1.00)
GFR calc Af Amer: >60 mL/min (ref >60)
GFR calc non Af Amer: >60 mL/min (ref >60)
Glucose, Bld: 102 mg/dL — ABNORMAL HIGH (ref 70–99)
Potassium: 3.8 mmol/L (ref 3.5–5.1)
Sodium: 139 mmol/L (ref 135–145)

## 2018-12-19 LAB — PROTIME-INR
INR: 2.6 — ABNORMAL HIGH (ref 0.8–1.2)
Prothrombin Time: 28.2 seconds — ABNORMAL HIGH (ref 11.4–15.2)

## 2018-12-19 LAB — MAGNESIUM: Magnesium: 1.8 mg/dL (ref 1.7–2.4)

## 2018-12-19 MED ORDER — PHYTONADIONE 5 MG PO TABS
5.0000 mg | ORAL_TABLET | Freq: Once | ORAL | Status: AC
  Administered 2018-12-19: 13:00:00 5 mg via ORAL
  Filled 2018-12-19: qty 1

## 2018-12-19 MED ORDER — POTASSIUM CHLORIDE CRYS ER 20 MEQ PO TBCR
40.0000 meq | EXTENDED_RELEASE_TABLET | Freq: Once | ORAL | Status: AC
  Administered 2018-12-19: 40 meq via ORAL
  Filled 2018-12-19: qty 2

## 2018-12-19 MED ORDER — MAGNESIUM OXIDE 400 (241.3 MG) MG PO TABS
400.0000 mg | ORAL_TABLET | Freq: Two times a day (BID) | ORAL | Status: DC
  Administered 2018-12-19 – 2018-12-20 (×3): 400 mg via ORAL
  Filled 2018-12-19 (×3): qty 1

## 2018-12-19 MED ORDER — WARFARIN SODIUM 1 MG PO TABS: 1.0000 mg | ORAL_TABLET | Freq: Every day | ORAL | Status: DC

## 2018-12-19 NOTE — Progress Notes (Signed)
CARDIAC REHAB PHASE I   PRE:  Rate/Rhythm: 89 SR    BP: sitting 125/49    SaO2: 95 RA  MODE:  Ambulation: 370 ft   POST:  Rate/Rhythm: 102 ST    BP: sitting 95/74     SaO2: 95 RA  Pt much stronger this pm. Able to stand independently and walk with rollator. We had assist x2 but she only needed standby assist x1. Slow pace, no major c/o. Tired toward end. This was third walk today. Return to recliner. BP lower after walk. Sts she felt weak but no dizziness. C/o right scapular pain/soreness. Gave her hot pack.  Progressing well and wanting to d/c home with husband. This is appropriate. Will need RW.  [id number]  [place], ACSM 12/19/2018 2:20 PM

## 2018-12-19 NOTE — Progress Notes (Signed)
Jadene Pierini PA called to clarify transfer orders that were placed yesterday but bed was never requested. PA stated that order is still active and to have pacer box in the room on arrival to 4E. Will continue to monitor closely.

## 2018-12-19 NOTE — Progress Notes (Signed)
Physical Therapy Treatment Patient Details Name: Mary Farrell MRN: [id number] DOB: 11/03/1945 Today's Date: 12/19/2018    History of Present Illness 73 year old female with likely rheumatic heart disease including mitral stenosis, aortic insufficiency, and recurrent paroxysmal atrial fibrillation and atrial flutter who describes a long history of symptoms consistent with chronic diastolic congestive heart failure. PT with severe symptomatic mitral valve stenosis and recurrent paroxysmal afib, now s/p minimally invasive mitral valve replacement on 11/18    PT Comments    Patient progressing well towards PT goals. Improved ambulation distance with Min guard assist for balance/safety progressing to using RW. 2/4 DOE noted but VSS. Tolerated ADL tasks standing at sink with close Min guard for safety. Pt likely will be safe to d/c home with HHPT and support from spouse if she continues to improve. Needs cues to consistently adhere to sternal precautions. Will continue to follow. Need to assess bed mobility next session especially if disposition becomes home.   Follow Up Recommendations  CIR;Supervision/Assistance - 24 hour     Equipment Recommendations  Rolling walker with 5" wheels    Recommendations for Other Services       Precautions / Restrictions Precautions Precautions: Fall Restrictions Weight Bearing Restrictions: Yes Other Position/Activity Restrictions: sternal precautions    Mobility  Bed Mobility               General bed mobility comments: Up in chair upon PT arrival.  Transfers Overall transfer level: Needs assistance Equipment used: Rolling walker (2 wheeled) Transfers: Sit to/from Stand Sit to Stand: Min guard         General transfer comment: Min guard for safety. Stood from chair x2 holding heart pillow.  Ambulation/Gait Ambulation/Gait assistance: Min guard Gait Distance (Feet): 270 Feet Assistive device: Rolling walker (2 wheeled) Gait  Pattern/deviations: Step-through pattern;Decreased stride length Gait velocity: decreased Gait velocity interpretation: 1.31 - 2.62 ft/sec, indicative of limited community ambulator General Gait Details: Slow, steady gait with cues for RW proximity. 2/4 DOE.   Stairs             Wheelchair Mobility    Modified Rankin (Stroke Patients Only)       Balance Overall balance assessment: Needs assistance Sitting-balance support: Feet supported;No upper extremity supported Sitting balance-Leahy Scale: Fair     Standing balance support: During functional activity Standing balance-Leahy Scale: Fair Standing balance comment: Able to stand at sink and wash face and brush hair with 1 UE support or close Min guard.                            Cognition Arousal/Alertness: Awake/alert Behavior During Therapy: WFL for tasks assessed/performed Overall Cognitive Status: Within Functional Limits for tasks assessed                                        Exercises      General Comments General comments (skin integrity, edema, etc.): VSS, BP pre activity 112/50, post activity BP 126/57, not being paced anymore. HR 85-102 bpm.      Pertinent Vitals/Pain Pain Assessment: Faces Faces Pain Scale: Hurts little more Pain Location: spot on posterior right shoulder Pain Descriptors / Indicators: Tender;Sore;Discomfort Pain Intervention(s): Repositioned;Heat applied;Monitored during session    Home Living  Prior Function            PT Goals (current goals can now be found in the care plan section) Progress towards PT goals: Progressing toward goals    Frequency    Min 3X/week      PT Plan Current plan remains appropriate    Co-evaluation              AM-PAC PT "6 Clicks" Mobility   Outcome Measure  Help needed turning from your back to your side while in a flat bed without using bedrails?: A Little Help needed  moving from lying on your back to sitting on the side of a flat bed without using bedrails?: A Little Help needed moving to and from a bed to a chair (including a wheelchair)?: A Little Help needed standing up from a chair using your arms (e.g., wheelchair or bedside chair)?: A Little Help needed to walk in hospital room?: A Little Help needed climbing 3-5 steps with a railing? : A Lot 6 Click Score: 17    End of Session Equipment Utilized During Treatment: Gait belt Activity Tolerance: Patient tolerated treatment well Patient left: in chair;with call bell/phone within reach Nurse Communication: Mobility status PT Visit Diagnosis: Unsteadiness on feet (R26.81);Pain Pain - Right/Left: Right Pain - part of body: Shoulder     Time: 0800-0827 PT Time Calculation (min) (ACUTE ONLY): 27 min  Charges:  $Gait Training: 8-22 mins $Therapeutic Activity: 8-22 mins                     Marisa Severin, PT, DPT Acute Rehabilitation Services Pager [phone] Office [place] 12/19/2018, 8:33 AM

## 2018-12-19 NOTE — Progress Notes (Addendum)
[address]       ,[place] [address]             [phone]      6 Days Post-Op Procedure(s) (LRB): MINIMALLY INVASIVE MITRAL VALVE (MV) REPLACEMENT using a Magna Mitral Ease 31 MM Valve. (Right) MINIMALLY INVASIVE MAZE PROCEDURE using a 45 MM AtriClip. (N/A) TRANSESOPHAGEAL ECHOCARDIOGRAM (TEE) (N/A) Subjective: Feeling better overall  Objective: Vital signs in last 24 hours: Temp:  [98.3 F (36.8 C)-98.4 F (36.9 C)] 98.3 F (36.8 C) (11/24 0336) Pulse Rate:  [78-116] 82 (11/24 0600) Cardiac Rhythm: Normal sinus rhythm (11/24 0400) Resp:  [11-26] 12 (11/24 0600) BP: (105-139)/(46-87) 118/55 (11/24 0600) SpO2:  [94 %-100 %] 100 % (11/24 0600) Weight:  [69.8 kg] 69.8 kg (11/24 0652)  Hemodynamic parameters for last 24 hours:    Intake/Output from previous day: 11/23 0701 - 11/24 0700 In: 1100.8 [P.O.:840; IV Piggyback:260.8] Out: 1900 [Urine:1900] Intake/Output this shift: No intake/output data recorded.  General appearance: alert, cooperative and no distress Heart: regular rate and rhythm Lungs: dim in lower fields Abdomen: benign Extremities: minor edema Wound: incis healing well  Lab Results: Recent Labs    12/17/18 0334 12/18/18 0523  WBC 9.0 5.6  HGB 9.1* 8.8*  HCT 28.0* 27.7*  PLT 130* 144*   BMET:  Recent Labs    12/18/18 0523 12/19/18 0415  NA 138 139  K 3.3* 3.8  CL 102 101  CO2 32 30  GLUCOSE 100* 102*  BUN 13 13  CREATININE 0.55 0.53  CALCIUM 7.9* 8.6*    PT/INR:  Recent Labs    12/19/18 0415  LABPROT 28.2*  INR 2.6*   ABG    Component Value Date/Time   PHART 7.372 12/14/2018 0537   HCO3 22.0 12/14/2018 0537   TCO2 23 12/14/2018 0537   ACIDBASEDEF 3.0 (H) 12/14/2018 0537   O2SAT 99.0 12/14/2018 0537   CBG (last 3)  Recent Labs    12/16/18 0733 12/16/18 1047  GLUCAP 95 122*    Meds Scheduled Meds: . aspirin EC  81 mg Oral Daily  . bisacodyl  10 mg Oral Daily   Or  . bisacodyl  10 mg  Rectal Daily  . chlorhexidine gluconate (MEDLINE KIT)  15 mL Mouth Rinse BID  . Chlorhexidine Gluconate Cloth  6 each Topical q morning - 10a  . docusate sodium  200 mg Oral Daily  . dorzolamide-timolol  1 drop Both Eyes Daily  . famotidine  20 mg Oral QHS  . flecainide  100 mg Oral BID  . furosemide  40 mg Oral Daily  . gabapentin  300 mg Oral TID  . latanoprost  1 drop Both Eyes QHS  . losartan  25 mg Oral Daily  . potassium chloride  20 mEq Oral Daily  . warfarin  2.5 mg Oral q1800  . Warfarin - Physician Dosing Inpatient   Does not apply q1800   Continuous Infusions: . sodium chloride    . sodium chloride Stopped (12/17/18 2316)   PRN Meds:.sodium chloride, ALPRAZolam, doxepin, metoprolol tartrate, ondansetron (ZOFRAN) IV, oxyCODONE, sodium chloride flush, sodium chloride flush, traMADol  Xrays Dg Chest Port 1 View In Am  Result Date: 12/18/2018 CLINICAL DATA:  Chest soreness, cardiac surgery. EXAM: PORTABLE CHEST 1 VIEW COMPARISON:  12/17/2018 and CT chest 06/21/2017. FINDINGS: Left IJ catheter terminates in the region of the left brachiocephalic vein. Right chest tube has been removed. Epicardial pacer wires and left atrial appendage clip  are in place. Heart size stable. Left lower lobe collapse/consolidation and moderate left pleural effusion appear similar. Streaky opacification in the right lung with a tiny right apical pneumothorax and decreasing right pleural fluid. IMPRESSION: 1. Left lower lobe opacification is likely due to a combination of chronic volume loss/scarring and atelectasis. Difficult to exclude pneumonia. Moderate left pleural effusion. 2. Removal of right chest tube with a small right hydropneumothorax. 3. Streaky opacification in the right lung may be due to atelectasis. Electronically Signed   By: Lorin Picket M.D.   On: 12/18/2018 07:45    Assessment/Plan: S/P Procedure(s) (LRB): MINIMALLY INVASIVE MITRAL VALVE (MV) REPLACEMENT using a Magna Mitral Ease  31 MM Valve. (Right) MINIMALLY INVASIVE MAZE PROCEDURE using a 45 MM AtriClip. (N/A) TRANSESOPHAGEAL ECHOCARDIOGRAM (TEE) (N/A)  1 conts to progress POD#6 2 some intermit afib and pauses, back on flecainide, conts potassium replacement, will also replace MG++. BP control is good 3 INR now 2.6, will reduce dose of coumadin 4 cont current lasix dose, normal renal fxn, good UOP 5 BS well controlled 6 no fevers 7 hopefully rehab soon when rhythm issues improved  LOS: 6 days    John Giovanni [facility] 12/19/2018 Pager [phone] not for patient use  I have seen and examined the patient and agree with the assessment and plan as outlined.  Patient reports feeling much better today and maintaining NSR the majority of the time.  No significant episodes of bradycardia.  Continue flecainide.  Hold Coumadin today and d/c pacing wires tomorrow if rhythm stable and INR decreased.  Still awaiting bed on 4E for transfer.  Possible d/c soon to CIR service versus home   Rexene Alberts, MD 12/19/2018 11:37 AM

## 2018-12-19 NOTE — Plan of Care (Signed)

## 2018-12-19 NOTE — Progress Notes (Signed)
Inpatient Rehabilitation Admissions Coordinator  Noted improvement in mobility with therapy today. Progressing well.  Danne Baxter, RN, MSN Rehab Admissions Coordinator [phone] 12/19/2018 9:21 AM

## 2018-12-20 LAB — PROTIME-INR
INR: 1.5 — ABNORMAL HIGH (ref 0.8–1.2)
Prothrombin Time: 18.2 seconds — ABNORMAL HIGH (ref 11.4–15.2)

## 2018-12-20 MED ORDER — POTASSIUM CHLORIDE CRYS ER 20 MEQ PO TBCR: 20.0000 meq | EXTENDED_RELEASE_TABLET | Freq: Every day | ORAL | 0 refills | Status: DC

## 2018-12-20 MED ORDER — MAGNESIUM OXIDE 400 (241.3 MG) MG PO TABS: 400.0000 mg | ORAL_TABLET | Freq: Two times a day (BID) | ORAL | 0 refills | Status: DC

## 2018-12-20 MED ORDER — ASPIRIN 81 MG PO TBEC: 81.0000 mg | DELAYED_RELEASE_TABLET | Freq: Every day | ORAL | Status: DC

## 2018-12-20 MED ORDER — FUROSEMIDE 40 MG PO TABS: 40.0000 mg | ORAL_TABLET | Freq: Every day | ORAL | 0 refills | Status: DC

## 2018-12-20 MED ORDER — LOSARTAN POTASSIUM 25 MG PO TABS: 25.0000 mg | ORAL_TABLET | Freq: Every day | ORAL | 1 refills | Status: DC

## 2018-12-20 MED ORDER — TRAMADOL HCL 50 MG PO TABS: 50.0000 mg | ORAL_TABLET | Freq: Four times a day (QID) | ORAL | 0 refills | Status: AC | PRN

## 2018-12-20 MED ORDER — WARFARIN SODIUM 2.5 MG PO TABS: 2.5000 mg | ORAL_TABLET | Freq: Every day | ORAL | 1 refills | Status: DC

## 2018-12-20 NOTE — Progress Notes (Addendum)
[address]       [place] [address]             [phone]      7 Days Post-Op Procedure(s) (LRB): MINIMALLY INVASIVE MITRAL VALVE (MV) REPLACEMENT using a Magna Mitral Ease 31 MM Valve. (Right) MINIMALLY INVASIVE MAZE PROCEDURE using a 45 MM AtriClip. (N/A) TRANSESOPHAGEAL ECHOCARDIOGRAM (TEE) (N/A) Subjective: C/o some back discomfort  Objective: Vital signs in last 24 hours: Temp:  [97.9 F (36.6 C)-99.3 F (37.4 C)] 98 F (36.7 C) (11/25 0505) Pulse Rate:  [85-94] 85 (11/25 0505) Cardiac Rhythm: Normal sinus rhythm;Heart block (11/25 0545) Resp:  [17-30] 17 (11/25 0505) BP: (94-138)/(46-79) 126/53 (11/25 0505) SpO2:  [97 %-98 %] 98 % (11/25 0505) Weight:  [69.2 kg-69.4 kg] 69.4 kg (11/25 0600)  Hemodynamic parameters for last 24 hours:    Intake/Output from previous day: 11/24 0701 - 11/25 0700 In: 180 [P.O.:180] Out: 175 [Urine:175] Intake/Output this shift: No intake/output data recorded.  General appearance: alert, cooperative and no distress Heart: regular rate and rhythm and soft systolic murmur Lungs: dim in lower fields Abdomen: benign Extremities: minor ankle edema Wound: incis healing well  Lab Results: Recent Labs    12/18/18 0523  WBC 5.6  HGB 8.8*  HCT 27.7*  PLT 144*   BMET:  Recent Labs    12/18/18 0523 12/19/18 0415  NA 138 139  K 3.3* 3.8  CL 102 101  CO2 32 30  GLUCOSE 100* 102*  BUN 13 13  CREATININE 0.55 0.53  CALCIUM 7.9* 8.6*    PT/INR:  Recent Labs    12/20/18 0233  LABPROT 18.2*  INR 1.5*   ABG    Component Value Date/Time   PHART 7.372 12/14/2018 0537   HCO3 22.0 12/14/2018 0537   TCO2 23 12/14/2018 0537   ACIDBASEDEF 3.0 (H) 12/14/2018 0537   O2SAT 99.0 12/14/2018 0537   CBG (last 3)  No results for input(s): GLUCAP in the last 72 hours.  Meds Scheduled Meds: . aspirin EC  81 mg Oral Daily  . bisacodyl  10 mg Oral Daily   Or  . bisacodyl  10 mg Rectal Daily  .  chlorhexidine gluconate (MEDLINE KIT)  15 mL Mouth Rinse BID  . Chlorhexidine Gluconate Cloth  6 each Topical q morning - 10a  . docusate sodium  200 mg Oral Daily  . dorzolamide-timolol  1 drop Both Eyes Daily  . famotidine  20 mg Oral QHS  . flecainide  100 mg Oral BID  . furosemide  40 mg Oral Daily  . gabapentin  300 mg Oral TID  . latanoprost  1 drop Both Eyes QHS  . losartan  25 mg Oral Daily  . magnesium oxide  400 mg Oral BID  . potassium chloride  20 mEq Oral Daily  . Warfarin - Physician Dosing Inpatient   Does not apply q1800   Continuous Infusions: . sodium chloride    . sodium chloride Stopped (12/17/18 2316)   PRN Meds:.sodium chloride, ALPRAZolam, doxepin, metoprolol tartrate, ondansetron (ZOFRAN) IV, oxyCODONE, sodium chloride flush, sodium chloride flush, traMADol  Xrays No results found.  Assessment/Plan: S/P Procedure(s) (LRB): MINIMALLY INVASIVE MITRAL VALVE (MV) REPLACEMENT using a Magna Mitral Ease 31 MM Valve. (Right) MINIMALLY INVASIVE MAZE PROCEDURE using a 45 MM AtriClip. (N/A) TRANSESOPHAGEAL ECHOCARDIOGRAM (TEE) (N/A)  1 doing well POD #7 2 stable vital signs, sinus rhythm with 1 deg AVB, occas short pauses 3 sats good on RA  4 INR 1.5- will d/c EPW's this am 5 mild volume overload, cont lasix- may not need much longer 6 doing well with rehab, does not appear to need CIR level rehab 7 poss home later today v tomorrow 8 will need to determine coumadin dose for d/c  LOS: 7 days    John Giovanni [facility] 12/20/2018  Pager [phone]- not for patient use   Chart reviewed, patient examined, agree with above. She looks good. INR was 2.6 yesterday and dropped today to 1.5 after dose of Coumadin held. Pacing wires removed this am. I think she can go home later today on 2.5 mg Coumadin with INR check Monday.

## 2018-12-20 NOTE — Progress Notes (Signed)
D/C instructions given to patient. Medications and wound care reviewed. All questions answered. Walker delivered to room. Husband to escort pt home.  Clyde Canterbury, RN

## 2018-12-20 NOTE — Progress Notes (Signed)
Physical Therapy Treatment Patient Details Name: Mary Farrell MRN: [id number] DOB: 1946/01/01 Today's Date: 12/20/2018    History of Present Illness 73 year old female with likely rheumatic heart disease including mitral stenosis, aortic insufficiency, and recurrent paroxysmal atrial fibrillation and atrial flutter who describes a long history of symptoms consistent with chronic diastolic congestive heart failure. PT with severe symptomatic mitral valve stenosis and recurrent paroxysmal afib, now s/p minimally invasive mitral valve replacement on 11/18    PT Comments    Patient seen for mobility progression. Pt is making good progress toward PT goals and tolerated gait and stair training well. Pt overall requires min guard/min A for mobility. Pt will need to use AD for gait as she demonstrates impaired balance and pt is agreeable. VSS on RA. Recommend HHPT for further skilled PT services to maximize independence and safety with mobility.     Follow Up Recommendations  Supervision/Assistance - 24 hour;Home health PT     Equipment Recommendations  Rolling walker with 5" wheels    Recommendations for Other Services       Precautions / Restrictions Precautions Precautions: Fall    Mobility  Bed Mobility Overal bed mobility: Needs Assistance Bed Mobility: Rolling;Sidelying to Sit Rolling: Min guard Sidelying to sit: Min assist       General bed mobility comments: cues for sequencing; assist to elevate trunk into sitting  Transfers Overall transfer level: Needs assistance Equipment used: None Transfers: Sit to/from Stand Sit to Stand: Min guard         General transfer comment: min guard for safety and balance, crossing arms and standing using LEs  Ambulation/Gait Ambulation/Gait assistance: Min assist;Min guard Gait Distance (Feet): 250 Feet Assistive device: None(assist at trunk with gait belt at times) Gait Pattern/deviations: Step-through pattern;Decreased stride  length;Drifts right/left Gait velocity: decreased   General Gait Details: unsteadiness noted without use of AD; assist for balance at times with pt near scissoring and with drift R/L   Stairs Stairs: Yes Stairs assistance: Min assist Stair Management: No rails;Forwards;Step to pattern(HHA) Number of Stairs: 3 General stair comments: assist for balance and to power up when ascending   Wheelchair Mobility    Modified Rankin (Stroke Patients Only)       Balance Overall balance assessment: Needs assistance Sitting-balance support: No upper extremity supported;Feet supported Sitting balance-Leahy Scale: Good     Standing balance support: Bilateral upper extremity supported;No upper extremity supported;During functional activity Standing balance-Leahy Scale: Fair Standing balance comment: min guard static standing but relaint on BUE support dynamically                            Cognition Arousal/Alertness: Awake/alert Behavior During Therapy: WFL for tasks assessed/performed Overall Cognitive Status: Within Functional Limits for tasks assessed                                        Exercises      General Comments General comments (skin integrity, edema, etc.): VSS on RA      Pertinent Vitals/Pain Pain Assessment: Faces Faces Pain Scale: Hurts little more Pain Location: spot on posterior right shoulder Pain Descriptors / Indicators: Tender;Sore;Discomfort Pain Intervention(s): Limited activity within patient's tolerance;Monitored during session;Repositioned    Home Living                      Prior  Function            PT Goals (current goals can now be found in the care plan section) Acute Rehab PT Goals Patient Stated Goal: to get stronger and get back home  Progress towards PT goals: Progressing toward goals    Frequency    Min 3X/week      PT Plan Discharge plan needs to be updated    Co-evaluation               AM-PAC PT "6 Clicks" Mobility   Outcome Measure  Help needed turning from your back to your side while in a flat bed without using bedrails?: A Little Help needed moving from lying on your back to sitting on the side of a flat bed without using bedrails?: A Little Help needed moving to and from a bed to a chair (including a wheelchair)?: A Little Help needed standing up from a chair using your arms (e.g., wheelchair or bedside chair)?: A Little Help needed to walk in hospital room?: A Little Help needed climbing 3-5 steps with a railing? : A Little 6 Click Score: 18    End of Session Equipment Utilized During Treatment: Gait belt Activity Tolerance: Patient tolerated treatment well Patient left: in chair;with call bell/phone within reach;with chair alarm set Nurse Communication: Mobility status PT Visit Diagnosis: Unsteadiness on feet (R26.81);Pain Pain - Right/Left: Right Pain - part of body: Shoulder     Time: [id number] PT Time Calculation (min) (ACUTE ONLY): 32 min  Charges:  $Gait Training: 23-37 mins                     Earney Navy, PTA Acute Rehabilitation Services Pager: [phone] Office: [phone]     Darliss Cheney 12/20/2018, 1:13 PM

## 2018-12-20 NOTE — Research (Signed)
TRAC-AF Registry Informed Consent   Subject Name: Mary Farrell  Subject met inclusion and exclusion criteria.  The informed consent form, registry requirements and expectations were reviewed with the subject and questions and concerns were addressed prior to the signing of the consent form.  The subject verbalized understanding of the registry requirements.  The subject agreed to participate in the [place] registry and signed the informed consent.  The informed consent was obtained prior to performance of any protocol-specific procedures for the subject.  A copy of the signed informed consent was given to the subjecy.  [name] 12/20/2018, 10:38 AM

## 2018-12-20 NOTE — Progress Notes (Signed)
CARDIAC REHAB PHASE I   Pt recently ambulated with PT and used stairs. Pt requesting rolling walker for home use, RN and CM made aware. Pt educated on importance of site care and monitoring incisions daily. Encouraged continued walks and IS use. Pt given heart healthy diet. Reviewed restrictions, and exercise guidelines. Will refer to CRP II [place]. Pt is interested in participating in Virtual Cardiac and Pulmonary Rehab. Pt advised that Virtual Cardiac and Pulmonary Rehab is provided at no cost to the patient.  Checklist:  1. Pt has smart device  ie smartphone and/or ipad for downloading an app  Yes 2. Reliable internet/wifi service    Yes 3. Understands how to use their smartphone and navigate within an app.  Yes  Pt verbalized understanding and is in agreement.  [id number] Rufina Falco, RN BSN 12/20/2018 10:33 AM

## 2018-12-20 NOTE — Care Management Important Message (Signed)
Important Message  Patient Details  Name: Mary Farrell MRN: [id number] Date of Birth: 08-15-1945   Medicare Important Message Given:  Yes     Shelda Altes 12/20/2018, 1:29 PM

## 2018-12-20 NOTE — TOC Transition Note (Signed)
Transition of Care [facility]) - CM/SW Discharge Note Marvetta Gibbons RN, BSN Transitions of Care Unit 4E- RN Case Manager [phone]  Patient Details  Name: GEORGETT DEPA MRN: [id number] Date of Birth: 1945-02-07  Transition of Care [facility]) CM/SW Contact:  Dawayne Patricia, RN Phone Number: 12/20/2018, 1:09 PM   Clinical Narrative:    Pt stable for transition home today, per cardiac rehab pt needs RW for home- order has been placed- call made to [facility] with [employer] for DME needs- RW to be delivered to room prior to discharge- pt's family looking for shower chair to purchase independently. No other needs noted.    Final next level of care: Home/Self Care Barriers to Discharge: No Barriers Identified   Patient Goals and CMS Choice Patient states their goals for this hospitalization and ongoing recovery are:: return home      Discharge Placement             Home/self care          Discharge Plan and Services                DME Arranged: Walker rolling DME Agency: [employer] Date DME Agency Contacted: 12/20/18 Time DME Agency Contacted: 1100 Representative spoke with at DME Agency: [place]: NA [place] Agency: NA        Social Determinants of Health ([place]) Interventions     Readmission Risk Interventions Readmission Risk Prevention Plan 12/20/2018  Transportation Screening Complete  PCP or Specialist Appt within 5-7 Days Complete  Home Care Screening Complete  Medication Review (RN CM) Complete  Some recent data might be hidden

## 2018-12-20 NOTE — Progress Notes (Signed)
EPW removed per protocol. VSS. Pt educated on 1 hour bed rest. Call light in reach. Will continue to monitor.  Clyde Canterbury, RN

## 2018-12-20 NOTE — Progress Notes (Signed)
Occupational Therapy Treatment Patient Details Name: Mary Farrell MRN: [id number] DOB: 1945-07-28 Today's Date: 12/20/2018    History of present illness 73 year old female with likely rheumatic heart disease including mitral stenosis, aortic insufficiency, and recurrent paroxysmal atrial fibrillation and atrial flutter who describes a long history of symptoms consistent with chronic diastolic congestive heart failure. PT with severe symptomatic mitral valve stenosis and recurrent paroxysmal afib, now s/p minimally invasive mitral valve replacement on 11/18   OT comments  Patient seated in recliner and agreeable to OT, eager for dc home today.  Progressing well towards OT goals, completing transfers with min guard, LB ADLs with min guard, and simulated tub transfers with min guard assist.  Reviewed compensatory techniques and energy conservation techniques.  She reports her tub has sliding glass doors and therefore cannot use 3:1 commode as shower chair, plans to purchase shower chair after dc.  Will follow acutely.    Follow Up Recommendations  Home health OT;Supervision/Assistance - 24 hour    Equipment Recommendations  None recommended by OT(plans to self purchase shower chair )    Recommendations for Other Services      Precautions / Restrictions Precautions Precautions: Fall Restrictions Weight Bearing Restrictions: Yes(sternal precautions)       Mobility Bed Mobility               General bed mobility comments: OOB in recliner upon entry  Transfers Overall transfer level: Needs assistance Equipment used: Rolling walker (2 wheeled) Transfers: Sit to/from Stand Sit to Stand: Min guard         General transfer comment: min guard for safety and balance, crossing arms and standing using LEs    Balance Overall balance assessment: Needs assistance Sitting-balance support: No upper extremity supported;Feet supported Sitting balance-Leahy Scale: Good     Standing  balance support: Bilateral upper extremity supported;No upper extremity supported;During functional activity Standing balance-Leahy Scale: Fair Standing balance comment: min guard static standing but relaint on BUE support dynamically                           ADL either performed or assessed with clinical judgement   ADL Overall ADL's : Needs assistance/impaired                 Upper Body Dressing : Supervision/safety;Sitting Upper Body Dressing Details (indicate cue type and reason): reviewed compensatory techniques, donning R UE first due to limited ROM  Lower Body Dressing: Min guard;Sit to/from stand Lower Body Dressing Details (indicate cue type and reason): able to manage sock, min guard sit to stand  Toilet Transfer: Min guard;Ambulation;RW Toilet Transfer Details (indicate cue type and reason): simulated in room     Tub/ Shower Transfer: Min guard;Tub transfer;Shower seat;Ambulation;Rolling walker Tub/Shower Transfer Details (indicate cue type and reason): simulated technique in room with min guard; wrote down technique to assist pt with recall; agreeable to have spouse assist Functional mobility during ADLs: Min guard;Rolling walker       Vision       Perception     Praxis      Cognition Arousal/Alertness: Awake/alert Behavior During Therapy: WFL for tasks assessed/performed Overall Cognitive Status: Within Functional Limits for tasks assessed                                          Exercises  Shoulder Instructions       General Comments VSS, RA    Pertinent Vitals/ Pain       Pain Assessment: Faces Faces Pain Scale: Hurts little more Pain Location: spot on posterior right shoulder Pain Descriptors / Indicators: Tender;Sore;Discomfort Pain Intervention(s): Repositioned;Monitored during session  Home Living                                          Prior Functioning/Environment               Frequency  Min 2X/week        Progress Toward Goals  OT Goals(current goals can now be found in the care plan section)  Progress towards OT goals: Progressing toward goals  Acute Rehab OT Goals Patient Stated Goal: to get stronger and get back home  OT Goal Formulation: With patient  Plan Discharge plan needs to be updated;Frequency remains appropriate    Co-evaluation                 AM-PAC OT "6 Clicks" Daily Activity     Outcome Measure   Help from another person eating meals?: None Help from another person taking care of personal grooming?: A Little Help from another person toileting, which includes using toliet, bedpan, or urinal?: A Little Help from another person bathing (including washing, rinsing, drying)?: A Little Help from another person to put on and taking off regular upper body clothing?: A Little Help from another person to put on and taking off regular lower body clothing?: A Little 6 Click Score: 19    End of Session Equipment Utilized During Treatment: Rolling walker  OT Visit Diagnosis: Other abnormalities of gait and mobility (R26.89);Muscle weakness (generalized) (M62.81)   Activity Tolerance Patient tolerated treatment well   Patient Left in chair;with call bell/phone within reach   Nurse Communication Mobility status        Time: [id number] OT Time Calculation (min): 21 min  Charges: OT General Charges $OT Visit: 1 Visit OT Treatments $Self Care/Home Management : 8-22 mins  Delight Stare, [place] Pager [phone] Office [phone]    Delight Stare 12/20/2018, 11:02 AM

## 2018-12-20 NOTE — Progress Notes (Signed)
Inpatient Rehabilitation Admissions Coordinator  I met with patient at bedside. She has progressed well and no longer in need of an inpt rehab admit. Her plans are to d/c home. We will sign off at this item.  Danne Baxter, RN, MSN Rehab Admissions Coordinator [phone] 12/20/2018 8:01 AM

## 2018-12-22 MED FILL — Heparin Sodium (Porcine) Inj 1000 Unit/ML: INTRAMUSCULAR | Qty: 10 | Status: AC

## 2018-12-22 MED FILL — Lidocaine HCl Local Soln Prefilled Syringe 100 MG/5ML (2%): INTRAMUSCULAR | Qty: 5 | Status: AC

## 2018-12-22 MED FILL — Sodium Chloride IV Soln 0.9%: INTRAVENOUS | Qty: 2000 | Status: AC

## 2018-12-22 MED FILL — Mannitol IV Soln 20%: INTRAVENOUS | Qty: 500 | Status: AC

## 2018-12-22 MED FILL — Sodium Bicarbonate IV Soln 8.4%: INTRAVENOUS | Qty: 50 | Status: AC

## 2018-12-22 MED FILL — Electrolyte-R (PH 7.4) Solution: INTRAVENOUS | Qty: 5000 | Status: AC

## 2018-12-25 ENCOUNTER — Telehealth: Payer: Self-pay | Admitting: Cardiovascular Disease

## 2018-12-25 NOTE — Telephone Encounter (Signed)
New message  STAT if HR is under 50 or over 120 (normal HR is 60-100 beats per minute)  1) What is your heart rate? 91  2) Do you have a log of your heart rate readings (document readings)? Yes   3) Do you have any other symptoms?sob, tired, in pain

## 2018-12-25 NOTE — Telephone Encounter (Signed)
Called patient, she states that she was discharged from hospital from heart surgery on 11/25, she went home and since then has been having some higher than normal HR. It has been all over the place from 80's to 120's, she states she is SOB at times, denies chest pains- does have back pain, she states that she does not know if she needs to have a Cardioversion this close to her surgery date- or what she should do in the meantime.  She does have coumadin appt tomorrow, and NP visit on Friday- I advised patient they could check her HR at the visit tomorrow, and that the NP would do an EKG on her on Friday- but she wanted to let Dr.Red Hill know she sent a mychart message as well with an attached EKG reading. Will route to MD

## 2018-12-25 NOTE — Telephone Encounter (Signed)
Discussed with Dr Oval Linsey, moved follow up appointment to 12/2 with Doreene Burke PA and ok to hold Losartan if blood pressure low. Discussed with patient and she stated her systolic blood pressure has been in the 90's as low as 90/52. She will hold Losartan and come for visit 12/2. She will bring her blood pressure machine to follow up visit

## 2018-12-27 ENCOUNTER — Ambulatory Visit (INDEPENDENT_AMBULATORY_CARE_PROVIDER_SITE_OTHER): Payer: Medicare Other | Admitting: Cardiology

## 2018-12-27 ENCOUNTER — Other Ambulatory Visit: Payer: Self-pay

## 2018-12-27 ENCOUNTER — Telehealth: Payer: Self-pay | Admitting: Cardiology

## 2018-12-27 ENCOUNTER — Ambulatory Visit (INDEPENDENT_AMBULATORY_CARE_PROVIDER_SITE_OTHER): Payer: Medicare Other | Admitting: Pharmacist Clinician (PhC)/ Clinical Pharmacy Specialist

## 2018-12-27 ENCOUNTER — Encounter: Payer: Self-pay | Admitting: Cardiology

## 2018-12-27 VITALS — BP 121/67 | HR 96 | Temp 97.3°F | Ht 60.0 in | Wt 153.0 lb

## 2018-12-27 DIAGNOSIS — I4819 Other persistent atrial fibrillation: Secondary | ICD-10-CM

## 2018-12-27 DIAGNOSIS — I480 Paroxysmal atrial fibrillation: Secondary | ICD-10-CM | POA: Diagnosis not present

## 2018-12-27 DIAGNOSIS — Z7901 Long term (current) use of anticoagulants: Secondary | ICD-10-CM

## 2018-12-27 DIAGNOSIS — Z953 Presence of xenogenic heart valve: Secondary | ICD-10-CM

## 2018-12-27 DIAGNOSIS — Z8679 Personal history of other diseases of the circulatory system: Secondary | ICD-10-CM

## 2018-12-27 DIAGNOSIS — Z9889 Other specified postprocedural states: Secondary | ICD-10-CM

## 2018-12-27 LAB — POCT INR: INR: 1.6 — AB (ref 2.0–3.0)

## 2018-12-27 NOTE — Assessment & Plan Note (Signed)
Coumadin Rx 

## 2018-12-27 NOTE — Telephone Encounter (Signed)
Routed to [place]

## 2018-12-27 NOTE — Assessment & Plan Note (Signed)
Complete bilateral atrial lesion set using cryothermy and bipolar radiofrequency ablation with clipping of LA appendage via right mini-thoracotomy approach

## 2018-12-27 NOTE — Telephone Encounter (Signed)
New Message    Pt is calling and says she needs her husband to come to her appt with her to assist her with walking     Please call

## 2018-12-27 NOTE — Assessment & Plan Note (Signed)
31 mm [facility] Mitral stented bovine pericardial tissue valve

## 2018-12-27 NOTE — Assessment & Plan Note (Signed)
Recurrent symptomatic PAF- VR 90's

## 2018-12-27 NOTE — Progress Notes (Signed)
Cardiology Office Note:    Date:  12/27/2018   ID:  Mary Farrell, DOB July 20, 1945, MRN [id number]  PCP:  Lawerance Cruel, MD  Cardiologist:  Skeet Latch, MD  Electrophysiologist:  Will Meredith Leeds, MD   Referring MD: Lawerance Cruel, MD   CC: nausea, tachycardia  History of Present Illness:    Mary Farrell is a 73 y.o. female with a hx of PAF status post prior ablations and severe mitral stenosis. She is status post minimally invasive mitral valve replacement and MAZE 12/13/2018.  Prior to her surgery she was in normal sinus rhythm on Flecainide 100 mg twice daily.  Post op she had PAF but had converted to NSR prior to discharge.  She was discharged 12/20/2018 on her prior dose of Flecainide-100 mg BID and Coumadin.   This past weekend she developed nausea and tachycardia. These are typical symptoms for her when she is in AF.  In the office today her HR is 96- AF.   Past Medical History:  Diagnosis Date  . Anxiety 06/29/2012  . Aortic regurgitation 08/13/2015  . Arthritis    "hands, wrists, back, feet, toes" (06/24/2017)  . Atrial flutter ([place]) 09/18/2015  . Atypical chest pain 05/13/2016  . Chest pain    remote cath in 1996 with NORMAL coronaries noted  . CHF (congestive heart failure) ([place])   . Dyspnea 10/30/2010  . Essential hypertension 09/05/2013  . Exogenous obesity    history of   . GERD (gastroesophageal reflux disease)   . Glaucoma, both eyes   . Headache, migraine    "stopped in my 50's" (09/18/2015)  . History of cardiovascular stress test 2007   showing no ischemia  . Hypertension   . Mitral stenosis and aortic insufficiency 06/23/2010  . Mitral stenosis with insufficiency   . Mitral stenosis with regurgitation   . Murmur    of mitral stenosis and moderate aortic insufficiency      . Nausea 05/01/2018  . OSA on CPAP   . Palpitations    occasional  . Paroxysmal A-fib ([place]) 06/24/2017  . Pericardial effusion 09/18/2015  . Persistent atrial fibrillation  ([employer]) 03/10/2017  . Personal history of rheumatic heart disease   . S/P minimally-invasive maze operation for atrial fibrillation 12/13/2018   Complete bilateral atrial lesion set using cryothermy and bipolar radiofrequency ablation with clipping of LA appendage via right mini-thoracotomy approach  . S/P minimally-invasive mitral valve replacement with bioprosthetic valve 12/13/2018   31 mm [facility] Mitral stented bovine pericardial tissue valve  . SBE (subacute bacterial endocarditis)    prophylaxis, patient unaware  . Sliding hiatal hernia   . Vertigo 05/01/2018    Past Surgical History:  Procedure Laterality Date  . ABDOMINAL HYSTERECTOMY  1975  . APPENDECTOMY  1977  . ATRIAL FIBRILLATION ABLATION N/A 03/10/2017   Procedure: ATRIAL FIBRILLATION ABLATION;  Surgeon: [name], MD;  Location: [place] CV LAB;  Service: Cardiovascular;  Laterality: N/A;  . ATRIAL FIBRILLATION ABLATION  06/24/2017  . ATRIAL FIBRILLATION ABLATION N/A 06/24/2017   Procedure: ATRIAL FIBRILLATION ABLATION;  Surgeon: [name], MD;  Location: [place] CV [facility];  Service: Cardiovascular;  Laterality: N/A;  . BUNIONECTOMY Right 2000s  . CARDIAC CATHETERIZATION  01/13/1995   normal coronary anatomy and mild mitral stenosis and mild pulmonary hypertension  . CARDIOVERSION N/A 09/22/2015   Procedure: CARDIOVERSION;  Surgeon: Jerline Pain, MD;  Location: [place];  Service: Cardiovascular;  Laterality: N/A;  . CARDIOVERSION N/A  10/25/2016   Procedure: CARDIOVERSION;  Surgeon: Fay Records, MD;  Location: [facility] ENDOSCOPY;  Service: Cardiovascular;  Laterality: N/A;  . CARDIOVERSION N/A 04/15/2017   Procedure: CARDIOVERSION;  Surgeon: Josue Hector, MD;  Location: [facility] ENDOSCOPY;  Service: Cardiovascular;  Laterality: N/A;  . CARDIOVERSION N/A 05/16/2017   Procedure: CARDIOVERSION;  Surgeon: Pixie Casino, MD;  Location: [facility] ENDOSCOPY;  Service: Cardiovascular;  Laterality: N/A;  .  CATARACT EXTRACTION W/ INTRAOCULAR LENS  IMPLANT, BILATERAL Bilateral 2013  . COLONOSCOPY  2013  . EYE SURGERY Bilateral    cataracts  . LAPAROSCOPIC CHOLECYSTECTOMY  1997  . MINIMALLY INVASIVE MAZE PROCEDURE N/A 12/13/2018   Procedure: MINIMALLY INVASIVE MAZE PROCEDURE using a 45 MM AtriClip.;  Surgeon: Rexene Alberts, MD;  Location: [place];  Service: Open Heart Surgery;  Laterality: N/A;  . MITRAL VALVE REPLACEMENT Right 12/13/2018   Procedure: MINIMALLY INVASIVE MITRAL VALVE (MV) REPLACEMENT using a Magna Mitral Ease 31 MM Valve.;  Surgeon: Rexene Alberts, MD;  Location: [place];  Service: Open Heart Surgery;  Laterality: Right;  . RIGHT/LEFT HEART CATH AND CORONARY ANGIOGRAPHY N/A 10/18/2018   Procedure: RIGHT/LEFT HEART CATH AND CORONARY ANGIOGRAPHY;  Surgeon: Leonie Man, MD;  Location: [place] CV [facility];  Service: Cardiovascular;  Laterality: N/A;  . TEE WITHOUT CARDIOVERSION N/A 06/23/2017   Procedure: TRANSESOPHAGEAL ECHOCARDIOGRAM (TEE);  Surgeon: Fay Records, MD;  Location: [place];  Service: Cardiovascular;  Laterality: N/A;  . TEE WITHOUT CARDIOVERSION N/A 10/17/2018   Procedure: TRANSESOPHAGEAL ECHOCARDIOGRAM (TEE);  Surgeon: Jerline Pain, MD;  Location: [facility] ENDOSCOPY;  Service: Cardiovascular;  Laterality: N/A;  . TEE WITHOUT CARDIOVERSION N/A 12/13/2018   Procedure: TRANSESOPHAGEAL ECHOCARDIOGRAM (TEE);  Surgeon: Rexene Alberts, MD;  Location: [place];  Service: Open Heart Surgery;  Laterality: N/A;  . TONSILLECTOMY AND ADENOIDECTOMY  1990s  . UVULOPALATOPHARYNGOPLASTY, TONSILLECTOMY AND SEPTOPLASTY  1990s    Current Medications: Current Meds  Medication Sig  . acetaminophen (TYLENOL) 500 MG tablet Take 1,000 mg by mouth every 6 (six) hours as needed for moderate pain or headache.   . ALPRAZolam (XANAX) 0.25 MG tablet Take 0.125 mg by mouth 2 (two) times daily as needed for anxiety.   Marland Kitchen aspirin EC 81 MG EC tablet Take 1 tablet (81 mg total) by mouth daily.  .  cetirizine (ZYRTEC) 10 MG tablet Take 10 mg by mouth at bedtime.   . Cholecalciferol (VITAMIN D) 2000 UNITS CAPS Take 2,000 Units by mouth daily.    Marland Kitchen conjugated estrogens (PREMARIN) vaginal cream Place 1 Applicatorful vaginally daily as needed (irritation).   . dorzolamide-timolol (COSOPT) 22.3-6.8 MG/ML ophthalmic solution Place 1 drop into both eyes daily.  Marland Kitchen doxepin (SINEQUAN) 10 MG capsule Take 10-20 mg by mouth at bedtime as needed (sleep).   . famotidine (PEPCID) 20 MG tablet Take 20 mg by mouth at bedtime.  . flecainide (TAMBOCOR) 50 MG tablet TAKE 2 TABLETS BY MOUTH TWO TIMES DAILY (Patient taking differently: Take 100 mg by mouth 2 (two) times daily. )  . furosemide (LASIX) 40 MG tablet Take 1 tablet (40 mg total) by mouth daily.  Marland Kitchen gabapentin (NEURONTIN) 300 MG capsule Take 300 mg by mouth 3 (three) times daily.   Marland Kitchen lactobacillus acidophilus (BACID) TABS tablet Take 1 tablet by mouth daily.   Marland Kitchen latanoprost (XALATAN) 0.005 % ophthalmic solution Place 1 drop into both eyes at bedtime.   Marland Kitchen losartan (COZAAR) 25 MG tablet Take 1 tablet (25 mg total) by mouth daily.  Marland Kitchen  magnesium oxide (MAG-OX) 400 (241.3 Mg) MG tablet Take 1 tablet (400 mg total) by mouth 2 (two) times daily.  . Multiple Vitamin (MULTIVITAMIN WITH MINERALS) TABS tablet Take 1 tablet by mouth daily.  . NON FORMULARY Apply 1 application topically 2 (two) times daily as needed (for eczema). Traimcinolone/[employer] Moist Cream  . omeprazole (PRILOSEC) 40 MG capsule Take 40 mg by mouth daily.   Marland Kitchen oxybutynin (DITROPAN-XL) 10 MG 24 hr tablet Take 10 mg by mouth daily.   . polyethylene glycol (MIRALAX / GLYCOLAX) 17 g packet Take 17 g by mouth daily as needed for moderate constipation.   . potassium chloride SA (KLOR-CON) 20 MEQ tablet Take 1 tablet (20 mEq total) by mouth daily.  Marland Kitchen PRESCRIPTION MEDICATION Inhale into the lungs at bedtime. CPAP  . traMADol (ULTRAM) 50 MG tablet Take 1 tablet (50 mg total) by mouth every 6 (six) hours as  needed for up to 7 days for moderate pain.  Marland Kitchen warfarin (COUMADIN) 2.5 MG tablet Take 1 tablet (2.5 mg total) by mouth daily at 6 PM. Take 1 tablet by mouth daily or as directed by coumadin clinic     Allergies:   Amoxicillin, Metoprolol tartrate, and Oxaprozin   Social History   Socioeconomic History  . Marital status: Married    Spouse name: Not on file  . Number of children: Not on file  . Years of education: Not on file  . Highest education level: Not on file  Occupational History  . Occupation: Retired  Scientific laboratory technician  . Financial resource strain: Not on file  . Food insecurity    Worry: Not on file    Inability: Not on file  . Transportation needs    Medical: Not on file    Non-medical: Not on file  Tobacco Use  . Smoking status: Never Smoker  . Smokeless tobacco: Never Used  Substance and Sexual Activity  . Alcohol use: Yes    Comment: rarely  . Drug use: No  . Sexual activity: Not Currently  Lifestyle  . Physical activity    Days per week: Not on file    Minutes per session: Not on file  . Stress: Not on file  Relationships  . Social Herbalist on phone: Not on file    Gets together: Not on file    Attends religious service: Not on file    Active member of club or organization: Not on file    Attends meetings of clubs or organizations: Not on file    Relationship status: Not on file  Other Topics Concern  . Not on file  Social History Narrative   Lives with husband in [place].     Family History: The patient's family history includes Diabetes in her brother; Heart attack in her mother; Heart failure in her maternal grandfather; Hypertension in her brother; Kidney disease in her brother.  ROS:   Please see the history of present illness.     All other systems reviewed and are negative.  EKGs/Labs/Other Studies Reviewed:    The following studies were reviewed today: Echo/ TEE 10/17/2018  EKG:  EKG is ordered today.  The ekg ordered today  demonstrates AF with VR 96, NSIVCD, QTc 492  Recent Labs: 12/11/2018: ALT 19 12/18/2018: Hemoglobin 8.8; Platelets 144 12/19/2018: BUN 13; Creatinine, Ser 0.53; Magnesium 1.8; Potassium 3.8; Sodium 139  Recent Lipid Panel No results found for: CHOL, TRIG, HDL, CHOLHDL, VLDL, LDLCALC, LDLDIRECT  Physical Exam:  VS:  BP 121/67   Pulse 96   Temp (!) 97.3 F (36.3 C)   Ht 5' (1.524 m)   Wt 153 lb (69.4 kg)   SpO2 94%   BMI 29.88 kg/m     Wt Readings from Last 3 Encounters:  12/27/18 153 lb (69.4 kg)  12/20/18 153 lb (69.4 kg)  12/11/18 153 lb 11.2 oz (69.7 kg)     GEN:  Well nourished, well developed in no acute distress HEENT: Normal NECK: No JVD; No carotid bruits CARDIAC: irregularly irregular, no murmurs, rubs, gallops RESPIRATORY:  Decreased breath sounds Rt base ABDOMEN: Soft, non-tender, non-distended MUSCULOSKELETAL:  No edema; No deformity  SKIN: Warm and dry- she is pale, ecchymosis Rt lateral chest wall and Rt breast.  NEUROLOGIC:  Alert and oriented x 3 PSYCHIATRIC:  Normal affect   ASSESSMENT:    Paroxysmal A-fib (HCC) Recurrent symptomatic PAF- VR 90's  S/P minimally-invasive maze operation for atrial fibrillation Complete bilateral atrial lesion set using cryothermy and bipolar radiofrequency ablation with clipping of LA appendage via right mini-thoracotomy approach  S/P minimally-invasive mitral valve replacement with bioprosthetic valve + maze procedure 31 mm [employer] Magna Mitral stented bovine pericardial tissue valve  Long term (current) use of anticoagulants Coumadin Rx  PLAN:    I discussed options with Dr. Sallyanne Kuster in the office today.  He suggested either elective cardioversion after 3 to 4 weeks of a therapeutic INR or if she was significantly uncomfortable and wanted to proceed with TEE cardioversion this was also an option.  I discussed this with the patient.  She does have a follow-up scheduled with Dr. Curt Bears in a week or so.  She would  like to keep that appointment and discuss it further with him.  She knows if she becomes significantly uncomfortable in the meantime to contact us and we can set her up for a TEE cardioversion.  I did not change her medications.  Her INR was checked today by the pharmacist.  The patient had also reported hypotension at home and was told to hold her Losartan. B/P today 120/67.  Her LVF was normal at TEE.  I suggested she continue hold her Losartan until seen by Dr Curt Bears.   Medication Adjustments/Labs and Tests Ordered: Current medicines are reviewed at length with the patient today.  Concerns regarding medicines are outlined above.  Orders Placed This Encounter  Procedures  . EKG 12-Lead   No orders of the defined types were placed in this encounter.   Patient Instructions  Medication Instructions:  CONTINUE TO HOLD LOSARTAN UNTIL APPT WITH DR CAMNITZ *If you need a refill on your cardiac medications before your next appointment, please call your pharmacy*  Lab Work: NONE  If you have labs (blood work) drawn today and your tests are completely normal, you will receive your results only by: Marland Kitchen MyChart Message (if you have MyChart) OR . A paper copy in the mail If you have any lab test that is abnormal or we need to change your treatment, we will call you to review the results.  Testing/Procedures: NONE   Follow-Up: At [facility], you and your health needs are our priority.  As part of our continuing mission to provide you with exceptional heart care, we have created designated Provider Care Teams.  These Care Teams include your primary Cardiologist (physician) and Advanced Practice Providers (APPs -  Physician Assistants and Nurse Practitioners) who all work together to provide you with the care you need, when you need  it.  Your next appointment:    FOLLOW UP AS SCHEDULED  The format for your next appointment:   In Person  Provider:   Allegra Lai, MD  Other  Instructions     Signed, Kerin Ransom, PA-C  12/27/2018 12:20 PM    [place]

## 2018-12-27 NOTE — Telephone Encounter (Signed)
Patient notified that spouse will not be able to come to appt d/t mobility issues

## 2018-12-27 NOTE — Patient Instructions (Signed)
Medication Instructions:  CONTINUE TO HOLD LOSARTAN UNTIL APPT WITH DR CAMNITZ *If you need a refill on your cardiac medications before your next appointment, please call your pharmacy*  Lab Work: NONE  If you have labs (blood work) drawn today and your tests are completely normal, you will receive your results only by: Marland Kitchen MyChart Message (if you have MyChart) OR . A paper copy in the mail If you have any lab test that is abnormal or we need to change your treatment, we will call you to review the results.  Testing/Procedures: NONE   Follow-Up: At [facility], you and your health needs are our priority.  As part of our continuing mission to provide you with exceptional heart care, we have created designated Provider Care Teams.  These Care Teams include your primary Cardiologist (physician) and Advanced Practice Providers (APPs -  Physician Assistants and Nurse Practitioners) who all work together to provide you with the care you need, when you need it.  Your next appointment:    FOLLOW UP AS SCHEDULED  The format for your next appointment:   In Person  Provider:   Allegra Lai, MD  Other Instructions

## 2018-12-29 ENCOUNTER — Other Ambulatory Visit: Payer: Self-pay | Admitting: Thoracic Surgery (Cardiothoracic Vascular Surgery)

## 2018-12-29 ENCOUNTER — Ambulatory Visit: Payer: Medicare Other | Admitting: General Practice

## 2018-12-29 DIAGNOSIS — Z8679 Personal history of other diseases of the circulatory system: Secondary | ICD-10-CM

## 2019-01-01 ENCOUNTER — Emergency Department (HOSPITAL_COMMUNITY): Payer: Medicare Other

## 2019-01-01 ENCOUNTER — Emergency Department (HOSPITAL_COMMUNITY)
Admission: EM | Admit: 2019-01-01 | Discharge: 2019-01-01 | Disposition: A | Discharge status: Home / Self Care | Payer: Medicare Other | Attending: Emergency Medicine | Admitting: Emergency Medicine

## 2019-01-01 ENCOUNTER — Ambulatory Visit: Payer: Self-pay

## 2019-01-01 ENCOUNTER — Encounter (HOSPITAL_COMMUNITY): Payer: Self-pay | Admitting: Emergency Medicine

## 2019-01-01 ENCOUNTER — Other Ambulatory Visit: Payer: Self-pay

## 2019-01-01 ENCOUNTER — Ambulatory Visit
Admission: RE | Admit: 2019-01-01 | Discharge: 2019-01-01 | Disposition: A | Discharge status: Home / Self Care | Payer: Medicare Other | Source: Ambulatory Visit | Attending: Thoracic Surgery (Cardiothoracic Vascular Surgery) | Admitting: Thoracic Surgery (Cardiothoracic Vascular Surgery)

## 2019-01-01 ENCOUNTER — Ambulatory Visit: Payer: Medicare Other | Admitting: Thoracic Surgery (Cardiothoracic Vascular Surgery)

## 2019-01-01 DIAGNOSIS — Z79899 Other long term (current) drug therapy: Secondary | ICD-10-CM | POA: Diagnosis not present

## 2019-01-01 DIAGNOSIS — I4819 Other persistent atrial fibrillation: Secondary | ICD-10-CM | POA: Insufficient documentation

## 2019-01-01 DIAGNOSIS — Y92531 Health care provider office as the place of occurrence of the external cause: Secondary | ICD-10-CM | POA: Insufficient documentation

## 2019-01-01 DIAGNOSIS — Y999 Unspecified external cause status: Secondary | ICD-10-CM | POA: Insufficient documentation

## 2019-01-01 DIAGNOSIS — Z7982 Long term (current) use of aspirin: Secondary | ICD-10-CM | POA: Diagnosis not present

## 2019-01-01 DIAGNOSIS — S098XXA Other specified injuries of head, initial encounter: Secondary | ICD-10-CM | POA: Diagnosis present

## 2019-01-01 DIAGNOSIS — Z7901 Long term (current) use of anticoagulants: Secondary | ICD-10-CM | POA: Insufficient documentation

## 2019-01-01 DIAGNOSIS — I4892 Unspecified atrial flutter: Secondary | ICD-10-CM | POA: Insufficient documentation

## 2019-01-01 DIAGNOSIS — I110 Hypertensive heart disease with heart failure: Secondary | ICD-10-CM | POA: Insufficient documentation

## 2019-01-01 DIAGNOSIS — Y939 Activity, unspecified: Secondary | ICD-10-CM | POA: Insufficient documentation

## 2019-01-01 DIAGNOSIS — I480 Paroxysmal atrial fibrillation: Secondary | ICD-10-CM | POA: Insufficient documentation

## 2019-01-01 DIAGNOSIS — W19XXXA Unspecified fall, initial encounter: Secondary | ICD-10-CM | POA: Insufficient documentation

## 2019-01-01 DIAGNOSIS — I509 Heart failure, unspecified: Secondary | ICD-10-CM | POA: Diagnosis not present

## 2019-01-01 DIAGNOSIS — S0990XA Unspecified injury of head, initial encounter: Secondary | ICD-10-CM

## 2019-01-01 DIAGNOSIS — Z9889 Other specified postprocedural states: Secondary | ICD-10-CM

## 2019-01-01 LAB — BASIC METABOLIC PANEL
Anion gap: 5 (ref 5–15)
BUN: 11 mg/dL (ref 8–23)
CO2: 29 mmol/L (ref 22–32)
Calcium: 8.8 mg/dL — ABNORMAL LOW (ref 8.9–10.3)
Chloride: 101 mmol/L (ref 98–111)
Creatinine, Ser: 0.76 mg/dL (ref 0.44–1.00)
GFR calc Af Amer: >60 mL/min (ref >60)
GFR calc non Af Amer: >60 mL/min (ref >60)
Glucose, Bld: 102 mg/dL — ABNORMAL HIGH (ref 70–99)
Potassium: 4.4 mmol/L (ref 3.5–5.1)
Sodium: 135 mmol/L (ref 135–145)

## 2019-01-01 LAB — PROTIME-INR
INR: 2 — ABNORMAL HIGH (ref 0.8–1.2)
Prothrombin Time: 22.1 seconds — ABNORMAL HIGH (ref 11.4–15.2)

## 2019-01-01 LAB — CBC
HCT: 30.7 % — ABNORMAL LOW (ref 36.0–46.0)
Hemoglobin: 9.6 g/dL — ABNORMAL LOW (ref 12.0–15.0)
MCH: 30.2 pg (ref 26.0–34.0)
MCHC: 31.3 g/dL (ref 30.0–36.0)
MCV: 96.5 fL (ref 80.0–100.0)
Platelets: 314 10*3/uL (ref 150–400)
RBC: 3.18 MIL/uL — ABNORMAL LOW (ref 3.87–5.11)
RDW: 15 % (ref 11.5–15.5)
WBC: 6.8 10*3/uL (ref 4.0–10.5)
nRBC: 0 % (ref 0.0–0.2)

## 2019-01-01 NOTE — ED Provider Notes (Signed)
[facility] EMERGENCY DEPARTMENT Provider Note   CSN: [id number] Arrival date & time: 01/01/19  1427     History   Chief Complaint Chief Complaint  Patient presents with  . Fall    HPI Mary Farrell is a 73 y.o. female.     The history is provided by the patient and medical records. No language interpreter was used.  Fall     73 year old female with history of paroxysmal atrial fibrillation currently on Coumadin, brought here via EMS from the doctor's office for evaluation of recent injury.  Patient report today she went to her cardiologist office for a follow-up after having open heart surgery recently.  Upon leaving, she was trying to open a door that came off from the hands and struck her causing her to fall to the ground.  She did strike her head but denies any loss of consciousness.  She did mention the door fell onto her but denies any significant injury from the impact of the door.  She does complain of mild tenderness to her forehead as well as right upper arm.  Pain is sharp, mild in severity with some upper back pain.  She denies any numbness or weakness denies any chest pain abdominal pain hip pain or pain to her lower extremities.  No specific treatment tried.  Patient denies any precipitating symptoms prior to the fall.  Past Medical History:  Diagnosis Date  . Anxiety 06/29/2012  . Aortic regurgitation 08/13/2015  . Arthritis    "hands, wrists, back, feet, toes" (06/24/2017)  . Atrial flutter ([place]) 09/18/2015  . Atypical chest pain 05/13/2016  . Chest pain    remote cath in 1996 with NORMAL coronaries noted  . CHF (congestive heart failure) ([place])   . Dyspnea 10/30/2010  . Essential hypertension 09/05/2013  . Exogenous obesity    history of   . GERD (gastroesophageal reflux disease)   . Glaucoma, both eyes   . Headache, migraine    "stopped in my 50's" (09/18/2015)  . History of cardiovascular stress test 2007   showing no ischemia  . Hypertension    . Mitral stenosis and aortic insufficiency 06/23/2010  . Mitral stenosis with insufficiency   . Mitral stenosis with regurgitation   . Murmur    of mitral stenosis and moderate aortic insufficiency      . Nausea 05/01/2018  . OSA on CPAP   . Palpitations    occasional  . Paroxysmal A-fib ([place]) 06/24/2017  . Pericardial effusion 09/18/2015  . Persistent atrial fibrillation ([place]) 03/10/2017  . Personal history of rheumatic heart disease   . S/P minimally-invasive maze operation for atrial fibrillation 12/13/2018   Complete bilateral atrial lesion set using cryothermy and bipolar radiofrequency ablation with clipping of LA appendage via right mini-thoracotomy approach  . S/P minimally-invasive mitral valve replacement with bioprosthetic valve 12/13/2018   31 mm [facility] Mitral stented bovine pericardial tissue valve  . SBE (subacute bacterial endocarditis)    prophylaxis, patient unaware  . Sliding hiatal hernia   . Vertigo 05/01/2018    Patient Active Problem List   Diagnosis Date Noted  . S/P minimally-invasive mitral valve replacement with bioprosthetic valve + maze procedure 12/13/2018  . S/P minimally-invasive maze operation for atrial fibrillation 12/13/2018  . Mitral stenosis with regurgitation   . Long term (current) use of anticoagulants 10/23/2018  . Nausea 05/01/2018  . Vertigo 05/01/2018  . Lump on finger 12/30/2017  . Pain in finger 12/29/2017  . Paroxysmal A-fib (  [place]) 06/24/2017  . Persistent atrial fibrillation 03/10/2017  . Asymptomatic microscopic hematuria 11/02/2016  . Rash 06/08/2016  . Atypical chest pain 05/13/2016  . Atrial flutter ([place]) 09/18/2015  . Pericardial effusion 09/18/2015  . Aortic regurgitation 08/13/2015  . Dysuria 09/24/2014  . IC (interstitial cystitis) 09/24/2014  . 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 Essential hypertension 09/05/2013  . Anxiety 06/29/2012  . GERD (gastroesophageal reflux disease) 03/11/2011  . Dyspnea 10/30/2010    Past  Surgical History:  Procedure Laterality Date  . ABDOMINAL HYSTERECTOMY  1975  . APPENDECTOMY  1977  . ATRIAL FIBRILLATION ABLATION N/A 03/10/2017   Procedure: ATRIAL FIBRILLATION ABLATION;  Surgeon: [name], MD;  Location: [place] CV LAB;  Service: Cardiovascular;  Laterality: N/A;  . ATRIAL FIBRILLATION ABLATION  06/24/2017  . ATRIAL FIBRILLATION ABLATION N/A 06/24/2017   Procedure: ATRIAL FIBRILLATION ABLATION;  Surgeon: [name], MD;  Location: [place] CV LAB;  Service: Cardiovascular;  Laterality: N/A;  . BUNIONECTOMY Right 2000s  . CARDIAC CATHETERIZATION  01/13/1995   normal coronary anatomy and mild mitral stenosis and mild pulmonary hypertension  . CARDIOVERSION N/A 09/22/2015   Procedure: CARDIOVERSION;  Surgeon: Jerline Pain, MD;  Location: [facility];  Service: Cardiovascular;  Laterality: N/A;  . CARDIOVERSION N/A 10/25/2016   Procedure: CARDIOVERSION;  Surgeon: Fay Records, MD;  Location: [facility] ENDOSCOPY;  Service: Cardiovascular;  Laterality: N/A;  . CARDIOVERSION N/A 04/15/2017   Procedure: CARDIOVERSION;  Surgeon: Josue Hector, MD;  Location: [facility] ENDOSCOPY;  Service: Cardiovascular;  Laterality: N/A;  . CARDIOVERSION N/A 05/16/2017   Procedure: CARDIOVERSION;  Surgeon: Pixie Casino, MD;  Location: [facility] ENDOSCOPY;  Service: Cardiovascular;  Laterality: N/A;  . CATARACT EXTRACTION W/ INTRAOCULAR LENS  IMPLANT, BILATERAL Bilateral 2013  . COLONOSCOPY  2013  . EYE SURGERY Bilateral    cataracts  . LAPAROSCOPIC CHOLECYSTECTOMY  1997  . MINIMALLY INVASIVE MAZE PROCEDURE N/A 12/13/2018   Procedure: MINIMALLY INVASIVE MAZE PROCEDURE using a 45 MM AtriClip.;  Surgeon: Rexene Alberts, MD;  Location: [place];  Service: Open Heart Surgery;  Laterality: N/A;  . MITRAL VALVE REPLACEMENT Right 12/13/2018   Procedure: MINIMALLY INVASIVE MITRAL VALVE (MV) REPLACEMENT using a Magna Mitral Ease 31 MM Valve.;  Surgeon: Rexene Alberts, MD;  Location: [facility];   Service: Open Heart Surgery;  Laterality: Right;  . RIGHT/LEFT HEART CATH AND CORONARY ANGIOGRAPHY N/A 10/18/2018   Procedure: RIGHT/LEFT HEART CATH AND CORONARY ANGIOGRAPHY;  Surgeon: Leonie Man, MD;  Location: [place] CV LAB;  Service: Cardiovascular;  Laterality: N/A;  . TEE WITHOUT CARDIOVERSION N/A 06/23/2017   Procedure: TRANSESOPHAGEAL ECHOCARDIOGRAM (TEE);  Surgeon: Fay Records, MD;  Location: [place];  Service: Cardiovascular;  Laterality: N/A;  . TEE WITHOUT CARDIOVERSION N/A 10/17/2018   Procedure: TRANSESOPHAGEAL ECHOCARDIOGRAM (TEE);  Surgeon: Jerline Pain, MD;  Location: [facility] ENDOSCOPY;  Service: Cardiovascular;  Laterality: N/A;  . TEE WITHOUT CARDIOVERSION N/A 12/13/2018   Procedure: TRANSESOPHAGEAL ECHOCARDIOGRAM (TEE);  Surgeon: Rexene Alberts, MD;  Location: [facility];  Service: Open Heart Surgery;  Laterality: N/A;  . TONSILLECTOMY AND ADENOIDECTOMY  1990s  . UVULOPALATOPHARYNGOPLASTY, TONSILLECTOMY AND SEPTOPLASTY  1990s     OB History   No obstetric history on file.      Home Medications    Prior to Admission medications   Medication Sig Start Date End Date Taking? Authorizing Provider  acetaminophen (TYLENOL) 500 MG tablet Take 1,000 mg by mouth every 6 (six) hours as needed for moderate  pain or headache.    Yes [provider]  ALPRAZolam (XANAX) 0.25 MG tablet Take 0.125 mg by mouth 2 (two) times daily as needed for anxiety.    Yes [provider]  aspirin EC 81 MG EC tablet Take 1 tablet (81 mg total) by mouth daily. 12/20/18  Yes Gold, Wayne E, PA-C  cetirizine (ZYRTEC) 10 MG tablet Take 10 mg by mouth at bedtime.    Yes [provider]  Cholecalciferol (VITAMIN D) 2000 UNITS CAPS Take 2,000 Units by mouth daily.     Yes [provider]  dorzolamide-timolol (COSOPT) 22.3-6.8 MG/ML ophthalmic solution Place 1 drop into both eyes daily.   Yes [provider]  doxepin (SINEQUAN) 10 MG capsule Take 10-20 mg  by mouth at bedtime as needed (sleep).  06/10/17  Yes [provider]  famotidine (PEPCID) 20 MG tablet Take 20 mg by mouth at bedtime.   Yes [provider]  flecainide (TAMBOCOR) 50 MG tablet TAKE 2 TABLETS BY MOUTH TWO TIMES DAILY Patient taking differently: Take 100 mg by mouth 2 (two) times daily.  02/21/18  Yes Baldwin Jamaica, PA-C  furosemide (LASIX) 40 MG tablet Take 1 tablet (40 mg total) by mouth daily. 12/20/18  Yes Gold, Wayne E, PA-C  gabapentin (NEURONTIN) 300 MG capsule Take 300 mg by mouth 3 (three) times daily.    Yes [provider]  lactobacillus acidophilus (BACID) TABS tablet Take 1 tablet by mouth daily.    Yes [provider]  latanoprost (XALATAN) 0.005 % ophthalmic solution Place 1 drop into both eyes at bedtime.  06/17/14  Yes [provider]  magnesium oxide (MAG-OX) 400 (241.3 Mg) MG tablet Take 1 tablet (400 mg total) by mouth 2 (two) times daily. 12/20/18  Yes Gold, Patrick Jupiter E, PA-C  Multiple Vitamin (MULTIVITAMIN WITH MINERALS) TABS tablet Take 1 tablet by mouth daily.   Yes [provider]  NON FORMULARY Apply 1 application topically 2 (two) times daily as needed (for eczema). Traimcinolone/[facility] Moist Cream   Yes [provider]  omeprazole (PRILOSEC) 40 MG capsule Take 40 mg by mouth daily.    Yes [provider]  oxybutynin (DITROPAN-XL) 10 MG 24 hr tablet Take 10 mg by mouth daily.  12/16/15  Yes [provider]  polyethylene glycol (MIRALAX / GLYCOLAX) 17 g packet Take 17 g by mouth daily as needed for moderate constipation.    Yes [provider]  potassium chloride SA (KLOR-CON) 20 MEQ tablet Take 1 tablet (20 mEq total) by mouth daily. 12/20/18  Yes Gold, Patrick Jupiter E, PA-C  PRESCRIPTION MEDICATION Inhale into the lungs at bedtime. CPAP   Yes [provider]  warfarin (COUMADIN) 2.5 MG tablet Take 1 tablet (2.5 mg total) by mouth daily at 6 PM. Take 1 tablet by mouth daily  or as directed by coumadin clinic Patient taking differently: Take 2.5 mg by mouth daily at 6 PM.  12/20/18  Yes Gold, Wayne E, PA-C  conjugated estrogens (PREMARIN) vaginal cream Place 1 Applicatorful vaginally daily as needed (irritation).     [provider]  losartan (COZAAR) 25 MG tablet Take 1 tablet (25 mg total) by mouth daily. Patient not taking: Reported on 01/01/2019 12/20/18   John Giovanni, PA-C    Family History Family History  Problem Relation Age of Onset  . Heart attack Mother   . Hypertension Brother   . Kidney disease Brother   . Diabetes Brother   . Heart failure  Maternal Grandfather     Social History Social History   Tobacco Use  . Smoking status: Never Smoker  . Smokeless tobacco: Never Used  Substance Use Topics  . Alcohol use: Yes    Comment: rarely  . Drug use: No     Allergies   Amoxicillin, Metoprolol tartrate, and Oxaprozin   Review of Systems Review of Systems  All other systems reviewed and are negative.    Physical Exam Updated Vital Signs BP 139/85 (BP Location: Right Arm)   Pulse (!) 107   Temp 98.4 F (36.9 C) (Oral)   Resp 18   SpO2 100%   Physical Exam Vitals signs and nursing note reviewed.  Constitutional:      General: She is not in acute distress.    Appearance: She is well-developed.  HENT:     Head: Normocephalic.     Comments: Faint ecchymosis noted to left forehead with mild tenderness to palpation but no crepitus.  No midface tenderness. Eyes:     Conjunctiva/sclera: Conjunctivae normal.  Neck:     Musculoskeletal: Neck supple.  Cardiovascular:     Rate and Rhythm: Normal rate and regular rhythm.     Pulses: Normal pulses.     Heart sounds: Normal heart sounds.  Pulmonary:     Breath sounds: Normal breath sounds.  Abdominal:     Palpations: Abdomen is soft.     Tenderness: There is no abdominal tenderness.  Musculoskeletal:        General: Tenderness (Mild tenderness to right upper arm without  any bruising or crepitus noted.  Right shoulder with full range of motion and no deformity and nontender.) present.     Comments: No significant midline spine tenderness, crepitus, or step-off noted.  Skin:    Findings: No rash.  Neurological:     Mental Status: She is alert and oriented to person, place, and time.  Psychiatric:        Mood and Affect: Mood normal.      ED Treatments / Results  Labs (all labs ordered are listed, but only abnormal results are displayed) Labs Reviewed  BASIC METABOLIC PANEL - Abnormal; Notable for the following components:      Result Value   Glucose, Bld 102 (*)    Calcium 8.8 (*)    All other components within normal limits  CBC - Abnormal; Notable for the following components:   RBC 3.18 (*)    Hemoglobin 9.6 (*)    HCT 30.7 (*)    All other components within normal limits  PROTIME-INR - Abnormal; Notable for the following components:   Prothrombin Time 22.1 (*)    INR 2.0 (*)    All other components within normal limits    EKG None  Radiology Dg Chest 2 View  Result Date: 01/01/2019 CLINICAL DATA:  s/p minimally invasive maze operation for afib. EXAM: CHEST - 2 VIEW COMPARISON:  Chest x-ray 12/18/2018. FINDINGS: Cardiac valve replacement. Left atrial appendage clip in stable position. Stable cardiomegaly. Bilateral pulmonary venous congestion and mild interstitial prominence. Small right pleural effusion. Findings consistent CHF. Similar findings noted on prior exam. No evidence of significant pneumothorax noted on today's exam. IMPRESSION: Cardiac valve replacement. Left atrial appendage clip noted stable position. Stable cardiomegaly. Bilateral pulmonary venous congestion and mild interstitial prominence. Small right pleural effusion. Findings consistent CHF. Similar findings noted on prior exam. 2.  No evidence of significant pneumothorax noted on today's exam. Electronically Signed   By: [place]  On: 01/01/2019 14:01   Ct Head  Wo Contrast  Result Date: 01/01/2019 CLINICAL DATA:  Head trauma with left forehead bruising. EXAM: CT HEAD WITHOUT CONTRAST TECHNIQUE: Contiguous axial images were obtained from the base of the skull through the vertex without intravenous contrast. COMPARISON:  10/15/2018 FINDINGS: Brain: There is no evidence of acute infarct, intracranial hemorrhage, mass, midline shift, or extra-axial fluid collection. The ventricles and sulci are within normal limits for age. Vascular: Calcified atherosclerosis at the skull base. No hyperdense vessel. Skull: No fracture or focal osseous lesion. Sinuses/Orbits: Paranasal sinuses and mastoid air cells are clear. Bilateral cataract extraction. Other: None. IMPRESSION: No evidence of acute intracranial abnormality. Electronically Signed   By: Logan Bores M.D.   On: 01/01/2019 16:38   Dg Humerus Right  Result Date: 01/01/2019 CLINICAL DATA:  Door fell on patient. Right arm pain. EXAM: RIGHT HUMERUS - 2+ VIEW COMPARISON:  None. FINDINGS: Acute bone or soft tissue abnormalities are present. No radiopaque foreign body is present. Mild degenerative changes are noted at the [facility] joint. IMPRESSION: 1. No acute or focal abnormality. 2. No radiopaque foreign body. Electronically Signed   By: San Morelle M.D.   On: 01/01/2019 15:23    Procedures Procedures (including critical care time)  Medications Ordered in ED Medications - No data to display   Initial Impression / Assessment and Plan / ED Course  I have reviewed the triage vital signs and the nursing notes.  Pertinent labs & imaging results that were available during my care of the patient were reviewed by me and considered in my medical decision making (see chart for details).        BP 139/85 (BP Location: Right Arm)   Pulse (!) 107   Temp 98.4 F (36.9 C) (Oral)   Resp 18   SpO2 100%    Final Clinical Impressions(s) / ED Diagnoses   Final diagnoses:  Fall, initial encounter  Minor head  injury, initial encounter    ED Discharge Orders    None     7:44 PM Patient suffered a mechanical fall when a door that she was trying to open came off the hinge causing her to fall to the ground.  Patient suffered from a small hematoma to left forehead and some mild tenderness to right upper arm.  CT scan and x-ray unremarkable.  Pain medication offered but patient declined.  Patient otherwise stable for discharge.  Care discussed with Dr. Ralene Bathe.   Domenic Moras, PA-C 01/01/19 Theodosia Paling, MD 01/04/19 1452

## 2019-01-01 NOTE — ED Triage Notes (Signed)
Pt arrives via [facility] from a doctors office, ems states doctors office is doing renovations and pt went to open the door to the office when it came off the hinges and fell onto her causing her to fall. Pt has bruising to L forehead, No LOC. Also c/o R shoulder pain, no obvious deformity. Hx of afib, on coumadin. Pt a/ox4, resp e/u, nad.

## 2019-01-01 NOTE — Discharge Instructions (Signed)
Fortunately no evidence of any broken bones on today's exam.  Take over the counter tylenol as needed for pain.  Follow up with your doctor for further care.

## 2019-01-03 ENCOUNTER — Ambulatory Visit (INDEPENDENT_AMBULATORY_CARE_PROVIDER_SITE_OTHER): Payer: Medicare Other | Admitting: Pharmacist Clinician (PhC)/ Clinical Pharmacy Specialist

## 2019-01-03 ENCOUNTER — Other Ambulatory Visit: Payer: Self-pay

## 2019-01-03 ENCOUNTER — Telehealth: Payer: Self-pay

## 2019-01-03 DIAGNOSIS — I480 Paroxysmal atrial fibrillation: Secondary | ICD-10-CM | POA: Diagnosis not present

## 2019-01-03 DIAGNOSIS — Z7901 Long term (current) use of anticoagulants: Secondary | ICD-10-CM

## 2019-01-03 DIAGNOSIS — Z953 Presence of xenogenic heart valve: Secondary | ICD-10-CM

## 2019-01-03 LAB — POCT INR: INR: 2.9 (ref 2.0–3.0)

## 2019-01-03 MED ORDER — TRAMADOL HCL 50 MG PO TABS: 50.0000 mg | ORAL_TABLET | Freq: Four times a day (QID) | ORAL | 0 refills | Status: AC | PRN

## 2019-01-03 NOTE — Telephone Encounter (Signed)
Per Dr. Roxy Manns, Tramadol Rx called to [facility] (50 mg q 6 hrs prn moderate pain, quantity 16 w/o refill). Pt notified.

## 2019-01-04 ENCOUNTER — Other Ambulatory Visit: Payer: Self-pay | Admitting: Thoracic Surgery (Cardiothoracic Vascular Surgery)

## 2019-01-04 DIAGNOSIS — Z9889 Other specified postprocedural states: Secondary | ICD-10-CM

## 2019-01-04 DIAGNOSIS — Z8679 Personal history of other diseases of the circulatory system: Secondary | ICD-10-CM

## 2019-01-05 ENCOUNTER — Other Ambulatory Visit: Payer: Self-pay | Admitting: Thoracic Surgery (Cardiothoracic Vascular Surgery)

## 2019-01-05 DIAGNOSIS — Z8679 Personal history of other diseases of the circulatory system: Secondary | ICD-10-CM

## 2019-01-08 ENCOUNTER — Ambulatory Visit (INDEPENDENT_AMBULATORY_CARE_PROVIDER_SITE_OTHER): Payer: Self-pay | Admitting: Physician Assistant

## 2019-01-08 ENCOUNTER — Ambulatory Visit
Admission: RE | Admit: 2019-01-08 | Discharge: 2019-01-08 | Disposition: A | Discharge status: Home / Self Care | Payer: Medicare Other | Source: Ambulatory Visit | Attending: Thoracic Surgery (Cardiothoracic Vascular Surgery) | Admitting: Thoracic Surgery (Cardiothoracic Vascular Surgery)

## 2019-01-08 ENCOUNTER — Other Ambulatory Visit: Payer: Self-pay

## 2019-01-08 VITALS — BP 132/83 | HR 80 | Temp 97.6°F | Resp 20 | Ht 60.0 in | Wt 152.0 lb

## 2019-01-08 DIAGNOSIS — Z8679 Personal history of other diseases of the circulatory system: Secondary | ICD-10-CM

## 2019-01-08 DIAGNOSIS — Z953 Presence of xenogenic heart valve: Secondary | ICD-10-CM

## 2019-01-08 MED ORDER — FUROSEMIDE 40 MG PO TABS: 40.0000 mg | ORAL_TABLET | Freq: Every day | ORAL | 1 refills | Status: DC

## 2019-01-08 MED ORDER — POTASSIUM CHLORIDE ER 10 MEQ PO TBCR: 10.0000 meq | EXTENDED_RELEASE_TABLET | Freq: Every day | ORAL | 2 refills | Status: DC

## 2019-01-08 NOTE — Patient Instructions (Signed)

## 2019-01-08 NOTE — Progress Notes (Signed)
HPI:  Patient returns for routine postoperative follow-up having undergone Minimally Invasive MAZE procedure and Mitral Valve Replacement on 12/13/2018. The patient's early postoperative recovery while in the hospital was as expected and she was discharged home in stable condition.  Patient was originally scheduled for follow up last week.  However while getting her xray a door fell and hit her from a construction site.  She was evaluated in the ED and discharged home later that evening.  Since that time the patient states she has been doing a little better.  She did have some periods of time where she was very sore after the door fell on her and this limited her ability to get up and move around.  She was experiencing increased pain in her right shoulder area which was worsened with deep inspiration.  She also notices that she had some dried blood along her right groin incision which is new from yesterday.  She also notes she is a little more short of breath and has gained some weight.  The patient also mentioned she had been in Atrial fibrillation, but since she was hit by the door she feels like she is in normal rhythm.  She is also experiencing some diarrhea which she feels is related to magnesium.  Current Outpatient Medications  Medication Sig Dispense Refill  . acetaminophen (TYLENOL) 500 MG tablet Take 1,000 mg by mouth every 6 (six) hours as needed for moderate pain or headache.     . ALPRAZolam (XANAX) 0.25 MG tablet Take 0.125 mg by mouth 2 (two) times daily as needed for anxiety.     Marland Kitchen aspirin EC 81 MG EC tablet Take 1 tablet (81 mg total) by mouth daily.    . cetirizine (ZYRTEC) 10 MG tablet Take 10 mg by mouth at bedtime.     . Cholecalciferol (VITAMIN D) 2000 UNITS CAPS Take 2,000 Units by mouth daily.      Marland Kitchen conjugated estrogens (PREMARIN) vaginal cream Place 1 Applicatorful vaginally daily as needed (irritation).     . dorzolamide-timolol (COSOPT) 22.3-6.8 MG/ML ophthalmic solution Place  1 drop into both eyes daily.    Marland Kitchen doxepin (SINEQUAN) 10 MG capsule Take 10-20 mg by mouth at bedtime as needed (sleep).     . famotidine (PEPCID) 20 MG tablet Take 20 mg by mouth at bedtime.    . flecainide (TAMBOCOR) 50 MG tablet TAKE 2 TABLETS BY MOUTH TWO TIMES DAILY (Patient taking differently: Take 100 mg by mouth 2 (two) times daily. ) 360 tablet 3  . gabapentin (NEURONTIN) 300 MG capsule Take 300 mg by mouth 3 (three) times daily.     Marland Kitchen lactobacillus acidophilus (BACID) TABS tablet Take 1 tablet by mouth daily.     Marland Kitchen latanoprost (XALATAN) 0.005 % ophthalmic solution Place 1 drop into both eyes at bedtime.   6  . losartan (COZAAR) 25 MG tablet Take 1 tablet (25 mg total) by mouth daily. 30 tablet 1  . Multiple Vitamin (MULTIVITAMIN WITH MINERALS) TABS tablet Take 1 tablet by mouth daily.    . NON FORMULARY Apply 1 application topically 2 (two) times daily as needed (for eczema). Traimcinolone/[facility] Moist Cream    . omeprazole (PRILOSEC) 40 MG capsule Take 40 mg by mouth daily.     Marland Kitchen oxybutynin (DITROPAN-XL) 10 MG 24 hr tablet Take 10 mg by mouth daily.     . polyethylene glycol (MIRALAX / GLYCOLAX) 17 g packet Take 17 g by mouth daily as needed for moderate constipation.     Marland Kitchen  PRESCRIPTION MEDICATION Inhale into the lungs at bedtime. CPAP    . warfarin (COUMADIN) 2.5 MG tablet Take 1 tablet (2.5 mg total) by mouth daily at 6 PM. Take 1 tablet by mouth daily or as directed by coumadin clinic (Patient taking differently: Take 2.5 mg by mouth daily at 6 PM. ) 100 tablet 1  . furosemide (LASIX) 40 MG tablet Take 1 tablet (40 mg total) by mouth daily. Take daily for 2 weeks, then take 1 tablet prn on days you notice weight gain of 3 lbs or more 30 tablet 1  . potassium chloride (KLOR-CON) 10 MEQ tablet Take 1 tablet (10 mEq total) by mouth daily. 30 tablet 2   No current facility-administered medications for this visit.    Physical Exam: BP 132/83   Pulse 80   Temp 97.6 F (36.4 C) (Skin)    Resp 20   Ht 5' (1.524 m)   Wt 152 lb (68.9 kg)   SpO2 92% Comment: RA  BMI 29.69 kg/m   Gen: no apparent distress, some bruising long left forehead Heart: RRR Lungs: diminished right base Ext: no edema present Incision: Mini Thoracotomy C/D/I, Right groin incision mostly healed, dermabond is coming off with minor skin separation, no evidence of infection present  Diagnostic Tests:  CXR: right sided pleural effusion, stable in appearance from 1 week prior  A/P;  1. S/P MVR/MAZE- she appears to be in NSR per ausculation today, she will get an EKG in Dr. Curt Bears office later this week 2. Right sided pleural effusion- she is currently not on Lasix, will give her a 40 mg RX to take daily for 2 weeks and then daily prn for weight gain of 3 lbs.. She will also be given an RX for potassium... we will repeat CXR in 2 weeks to reassess effusion 3. Diarrhea- likely related to Magnesium, will discontinue, not currently taking stool softners 4. Dispo- patient stable, will have her RTC in 2 weeks for repeat CXR.Marland Kitchen if effusion doesn't improve may require Thoracentesis.  She was instructed that should her shortness of breath increase, she is to contact the office so she can be evaluated sooner, she will see EP on Thursday and will get EKG, appears to be in NSR today, stop Magnesium, further d/c instructions provided   Ellwood Handler, PA-C [facility] [phone]

## 2019-01-10 ENCOUNTER — Other Ambulatory Visit: Payer: Self-pay | Admitting: Thoracic Surgery (Cardiothoracic Vascular Surgery)

## 2019-01-10 DIAGNOSIS — Z8679 Personal history of other diseases of the circulatory system: Secondary | ICD-10-CM

## 2019-01-11 ENCOUNTER — Encounter: Payer: Self-pay | Admitting: Cardiology

## 2019-01-11 ENCOUNTER — Ambulatory Visit (INDEPENDENT_AMBULATORY_CARE_PROVIDER_SITE_OTHER): Payer: Medicare Other | Admitting: Cardiology

## 2019-01-11 ENCOUNTER — Other Ambulatory Visit: Payer: Self-pay

## 2019-01-11 ENCOUNTER — Other Ambulatory Visit: Payer: Self-pay | Admitting: Surgical

## 2019-01-11 VITALS — BP 148/76 | HR 94 | Ht 60.0 in | Wt 155.0 lb

## 2019-01-11 DIAGNOSIS — I480 Paroxysmal atrial fibrillation: Secondary | ICD-10-CM

## 2019-01-11 MED ORDER — CARVEDILOL 6.25 MG PO TABS: 6.2500 mg | ORAL_TABLET | Freq: Two times a day (BID) | ORAL | 6 refills | Status: DC

## 2019-01-11 NOTE — Patient Instructions (Addendum)
Medication Instructions:  Your physician has recommended you make the following change in your medication:  1. STOP Flecainide 2. START Carvedilol 6.25 mg twice a day  * If you need a refill on your cardiac medications before your next appointment, please call your pharmacy.   Labwork: None ordered  Testing/Procedures: None ordered  Follow-Up: At [facility], you and your health needs are our priority.  As part of our continuing mission to provide you with exceptional heart care, we have created designated Provider Care Teams.  These Care Teams include your primary Cardiologist (physician) and Advanced Practice Providers (APPs -  Physician Assistants and Nurse Practitioners) who all work together to provide you with the care you need, when you need it.  You will need a follow up appointment in 3 months.  Please call our office 2 months in advance to schedule this appointment.  You may  one of the following Advanced Practice Providers on your designated Care Team:    Chanetta Marshall, NP  Tommye Standard, PA-C  Oda Kilts, [place]  Your physician wants you to follow-up in: 6 months with Dr. Curt Bears. You will receive a reminder letter in the mail two months in advance. If you don't receive a letter, please call our office to schedule the follow-up appointment.   Thank you for choosing [facility]!!   Trinidad Curet, RN [phone]  Any Other Special Instructions Will Be Listed Below (If Applicable).   Carvedilol tablets What is this medicine? CARVEDILOL (KAR ve dil ol) is a beta-blocker. Beta-blockers reduce the workload on the heart and help it to beat more regularly. This medicine is used to treat high blood pressure and heart failure. This medicine may be used for other purposes; ask your health care provider or pharmacist if you have questions. COMMON BRAND NAME(S): Coreg What should I tell my health care provider before I take this medicine? They need to know if you have  any of these conditions:  circulation problems  diabetes  history of heart attack or heart disease  liver disease  lung or breathing disease, like asthma or emphysema  pheochromocytoma  slow or irregular heartbeat  thyroid disease  an unusual or allergic reaction to carvedilol, other beta-blockers, medicines, foods, dyes, or preservatives  pregnant or trying to get pregnant  breast-feeding How should I use this medicine? Take this medicine by mouth with a glass of water. Follow the directions on the prescription label. It is best to take the tablets with food. Take your doses at regular intervals. Do not take your medicine more often than directed. Do not stop taking except on the advice of your doctor or health care professional. Talk to your pediatrician regarding the use of this medicine in children. Special care may be needed. Overdosage: If you think you have taken too much of this medicine contact a poison control center or emergency room at once. NOTE: This medicine is only for you. Do not share this medicine with others. What if I miss a dose? If you miss a dose, take it as soon as you can. If it is almost time for your next dose, take only that dose. Do not take double or extra doses. What may interact with this medicine? This medicine may interact with the following medications:  certain medicines for blood pressure, heart disease, irregular heart beat  certain medicines for depression, like fluoxetine or paroxetine  certain medicines for diabetes, like glipizide or glyburide  cimetidine  clonidine  cyclosporine  digoxin  MAOIs like Carbex, Eldepryl, Marplan, Nardil, and Parnate  reserpine  rifampin This list may not describe all possible interactions. Give your health care provider a list of all the medicines, herbs, non-prescription drugs, or dietary supplements you use. Also tell them if you smoke, drink alcohol, or use illegal drugs. Some items may  interact with your medicine. What should I watch for while using this medicine? Check your heart rate and blood pressure regularly while you are taking this medicine. Ask your doctor or health care professional what your heart rate and blood pressure should be, and when you should contact him or her. Do not stop taking this medicine suddenly. This could lead to serious heart-related effects. Contact your doctor or health care professional if you have difficulty breathing while taking this drug. Check your weight daily. Ask your doctor or health care professional when you should notify him/her of any weight gain. You may get drowsy or dizzy. Do not drive, use machinery, or do anything that requires mental alertness until you know how this medicine affects you. To reduce the risk of dizzy or fainting spells, do not sit or stand up quickly. Alcohol can make you more drowsy, and increase flushing and rapid heartbeats. Avoid alcoholic drinks. This medicine may increase blood sugar. Ask your healthcare provider if changes in diet or medicines are needed if you have diabetes. If you are going to have surgery, tell your doctor or health care professional that you are taking this medicine. What side effects may I notice from receiving this medicine? Side effects that you should report to your doctor or health care professional as soon as possible:  allergic reactions like skin rash, itching or hives, swelling of the face, lips, or tongue  breathing problems  dark urine  irregular heartbeat   signs and symptoms of high blood sugar such as being more thirsty or hungry or having to urinate more than normal. You may also feel very tired or have blurry vision.  swollen legs or ankles  vomiting  yellowing of the eyes or skin Side effects that usually do not require medical attention (report to your doctor or health care professional if they continue or are bothersome):  change in sex drive or  performance  diarrhea  dry eyes (especially if wearing contact lenses)  dry, itching skin  headache  nausea  unusually tired This list may not describe all possible side effects. Call your doctor for medical advice about side effects. You may report side effects to FDA at 1-800-FDA-1088. Where should I keep my medicine? Keep out of the reach of children. Store at room temperature below 30 degrees C (86 degrees F). Protect from moisture. Keep container tightly closed. Throw away any unused medicine after the expiration date. NOTE: This sheet is a summary. It may not cover all possible information. If you have questions about this medicine, talk to your doctor, pharmacist, or health care provider.  2020 Elsevier/Gold Standard (2017-11-02 09:13:57)

## 2019-01-11 NOTE — Progress Notes (Signed)
Electrophysiology Office Note   Date:  01/11/2019   ID:  Mary Farrell, DOB [birth year], MRN [id number]  PCP:  Lawerance Cruel, MD  Cardiologist:  Oval Linsey Primary Electrophysiologist:  Joandry Slagter Meredith Leeds, MD    No chief complaint on file.    History of Present Illness: Mary Farrell is a 73 y.o. female who is being seen today for the evaluation of atrial fibrillation at the request of Skeet Latch, MD. Presenting today for electrophysiology evaluation. She has a history of moderate aortic regurgitation, mild to moderate mitral stenosis, chronic pericardial effusion, paroxysmal atrial flutter, and hypertension. She had a heart catheterization in 1996 revealed normal coronaries with mild MS and mild pulmonary hypertension.  She was admitted 09/14/2015 with atrial flutter. She was started on amiodarone but did not tolerate it due to nausea.  He has had multiple cardioversions in the past.  She had an atrial fibrillation/flutter ablation 03/10/2017 with repeat ablation 06/13/2017.    Today, denies symptoms of palpitations, chest pain, shortness of breath, orthopnea, PND, lower extremity edema, claudication, dizziness, presyncope, syncope, bleeding, or neurologic sequela. The patient is tolerating medications without difficulties.  Overall she has been doing well.  She is now status post minimally invasive mitral valve replacement with surgical maze procedure.  She tolerated that procedure well.  She did have some atrial fibrillation postop and was put on flecainide.  She is in junctional rhythm today.  She was also having some shortness of breath and is since been put on Lasix.   Past Medical History:  Diagnosis Date  . Anxiety 06/29/2012  . Aortic regurgitation 08/13/2015  . Arthritis    "hands, wrists, back, feet, toes" (06/24/2017)  . Atrial flutter ([place]) 09/18/2015  . Atypical chest pain 05/13/2016  . Chest pain    remote cath in 1996 with NORMAL coronaries noted  . CHF (congestive  heart failure) ([place])   . Dyspnea 10/30/2010  . Essential hypertension 09/05/2013  . Exogenous obesity    history of   . GERD (gastroesophageal reflux disease)   . Glaucoma, both eyes   . Headache, migraine    "stopped in my 50's" (09/18/2015)  . History of cardiovascular stress test 2007   showing no ischemia  . Hypertension   . Mitral stenosis and aortic insufficiency 06/23/2010  . Mitral stenosis with insufficiency   . Mitral stenosis with regurgitation   . Murmur    of mitral stenosis and moderate aortic insufficiency      . Nausea 05/01/2018  . OSA on CPAP   . Palpitations    occasional  . Paroxysmal A-fib ([place]) 06/24/2017  . Pericardial effusion 09/18/2015  . Persistent atrial fibrillation ([place]) 03/10/2017  . Personal history of rheumatic heart disease   . S/P minimally-invasive maze operation for atrial fibrillation 12/13/2018   Complete bilateral atrial lesion set using cryothermy and bipolar radiofrequency ablation with clipping of LA appendage via right mini-thoracotomy approach  . S/P minimally-invasive mitral valve replacement with bioprosthetic valve 12/13/2018   31 mm [facility] Mitral stented bovine pericardial tissue valve  . SBE (subacute bacterial endocarditis)    prophylaxis, patient unaware  . Sliding hiatal hernia   . Vertigo 05/01/2018   Past Surgical History:  Procedure Laterality Date  . ABDOMINAL HYSTERECTOMY  1975  . APPENDECTOMY  1977  . ATRIAL FIBRILLATION ABLATION N/A 03/10/2017   Procedure: ATRIAL FIBRILLATION ABLATION;  Surgeon: [name], MD;  Location: [place] [facility];  Service: Cardiovascular;  Laterality: N/A;  .  ATRIAL FIBRILLATION ABLATION  06/24/2017  . ATRIAL FIBRILLATION ABLATION N/A 06/24/2017   Procedure: ATRIAL FIBRILLATION ABLATION;  Surgeon: [name], MD;  Location: [place] [facility];  Service: Cardiovascular;  Laterality: N/A;  . BUNIONECTOMY Right 2000s  . CARDIAC CATHETERIZATION  01/13/1995   normal coronary  anatomy and mild mitral stenosis and mild pulmonary hypertension  . CARDIOVERSION N/A 09/22/2015   Procedure: CARDIOVERSION;  Surgeon: Jerline Pain, MD;  Location: [place];  Service: Cardiovascular;  Laterality: N/A;  . CARDIOVERSION N/A 10/25/2016   Procedure: CARDIOVERSION;  Surgeon: Fay Records, MD;  Location: [facility] ENDOSCOPY;  Service: Cardiovascular;  Laterality: N/A;  . CARDIOVERSION N/A 04/15/2017   Procedure: CARDIOVERSION;  Surgeon: Josue Hector, MD;  Location: [facility] ENDOSCOPY;  Service: Cardiovascular;  Laterality: N/A;  . CARDIOVERSION N/A 05/16/2017   Procedure: CARDIOVERSION;  Surgeon: Pixie Casino, MD;  Location: [facility] ENDOSCOPY;  Service: Cardiovascular;  Laterality: N/A;  . CATARACT EXTRACTION W/ INTRAOCULAR LENS  IMPLANT, BILATERAL Bilateral 2013  . COLONOSCOPY  2013  . EYE SURGERY Bilateral    cataracts  . LAPAROSCOPIC CHOLECYSTECTOMY  1997  . MINIMALLY INVASIVE MAZE PROCEDURE N/A 12/13/2018   Procedure: MINIMALLY INVASIVE MAZE PROCEDURE using a 45 MM AtriClip.;  Surgeon: Rexene Alberts, MD;  Location: [place];  Service: Open Heart Surgery;  Laterality: N/A;  . MITRAL VALVE REPLACEMENT Right 12/13/2018   Procedure: MINIMALLY INVASIVE MITRAL VALVE (MV) REPLACEMENT using a Magna Mitral Ease 31 MM Valve.;  Surgeon: Rexene Alberts, MD;  Location: [place];  Service: Open Heart Surgery;  Laterality: Right;  . RIGHT/LEFT HEART CATH AND CORONARY ANGIOGRAPHY N/A 10/18/2018   Procedure: RIGHT/LEFT HEART CATH AND CORONARY ANGIOGRAPHY;  Surgeon: Leonie Man, MD;  Location: [place] [facility];  Service: Cardiovascular;  Laterality: N/A;  . TEE WITHOUT CARDIOVERSION N/A 06/23/2017   Procedure: TRANSESOPHAGEAL ECHOCARDIOGRAM (TEE);  Surgeon: Fay Records, MD;  Location: [place];  Service: Cardiovascular;  Laterality: N/A;  . TEE WITHOUT CARDIOVERSION N/A 10/17/2018   Procedure: TRANSESOPHAGEAL ECHOCARDIOGRAM (TEE);  Surgeon: Jerline Pain, MD;  [facility] ENDOSCOPY;  Service:  Cardiovascular;  Laterality: N/A;  . TEE WITHOUT CARDIOVERSION N/A 12/13/2018   Procedure: TRANSESOPHAGEAL ECHOCARDIOGRAM (TEE);  Surgeon: Rexene Alberts, MD;  Location: [place];  Service: Open Heart Surgery;  Laterality: N/A;  . TONSILLECTOMY AND ADENOIDECTOMY  1990s  . UVULOPALATOPHARYNGOPLASTY, TONSILLECTOMY AND SEPTOPLASTY  1990s     Current Outpatient Medications  Medication Sig Dispense Refill  . acetaminophen (TYLENOL) 500 MG tablet Take 1,000 mg by mouth every 6 (six) hours as needed for moderate pain or headache.     . ALPRAZolam (XANAX) 0.25 MG tablet Take 0.125 mg by mouth 2 (two) times daily as needed for anxiety.     Marland Kitchen aspirin EC 81 MG EC tablet Take 1 tablet (81 mg total) by mouth daily.    . cetirizine (ZYRTEC) 10 MG tablet Take 10 mg by mouth at bedtime.     . Cholecalciferol (VITAMIN D) 2000 UNITS CAPS Take 2,000 Units by mouth daily.      Marland Kitchen conjugated estrogens (PREMARIN) vaginal cream Place 1 Applicatorful vaginally daily as needed (irritation).     . dorzolamide-timolol (COSOPT) 22.3-6.8 MG/ML ophthalmic solution Place 1 drop into both eyes daily.    Marland Kitchen doxepin (SINEQUAN) 10 MG capsule Take 10-20 mg by mouth at bedtime as needed (sleep).     . famotidine (PEPCID) 20 MG tablet Take 20 mg by mouth at bedtime.    Marland Kitchen  furosemide (LASIX) 40 MG tablet Take 1 tablet (40 mg total) by mouth daily. Take daily for 2 weeks, then take 1 tablet prn on days you notice weight gain of 3 lbs or more 30 tablet 1  . gabapentin (NEURONTIN) 300 MG capsule Take 300 mg by mouth 3 (three) times daily.     Marland Kitchen lactobacillus acidophilus (BACID) TABS tablet Take 1 tablet by mouth daily.     Marland Kitchen latanoprost (XALATAN) 0.005 % ophthalmic solution Place 1 drop into both eyes at bedtime.   6  . losartan (COZAAR) 25 MG tablet Take 25 mg by mouth daily.    . Multiple Vitamin (MULTIVITAMIN WITH MINERALS) TABS tablet Take 1 tablet by mouth daily.    . NON FORMULARY Apply 1 application topically 2 (two) times daily  as needed (for eczema). Traimcinolone/[employer] Moist Cream    . omeprazole (PRILOSEC) 40 MG capsule Take 40 mg by mouth daily.     Marland Kitchen oxybutynin (DITROPAN-XL) 10 MG 24 hr tablet Take 10 mg by mouth daily.     . polyethylene glycol (MIRALAX / GLYCOLAX) 17 g packet Take 17 g by mouth daily as needed for moderate constipation.     . potassium chloride (KLOR-CON) 10 MEQ tablet Take 1 tablet (10 mEq total) by mouth daily. 30 tablet 2  . PRESCRIPTION MEDICATION Inhale into the lungs at bedtime. CPAP    . warfarin (COUMADIN) 2.5 MG tablet Take 1 tablet (2.5 mg total) by mouth daily at 6 PM. Take 1 tablet by mouth daily or as directed by coumadin clinic (Patient taking differently: Take 2.5 mg by mouth daily at 6 PM. ) 100 tablet 1  . carvedilol (COREG) 6.25 MG tablet Take 1 tablet (6.25 mg total) by mouth 2 (two) times daily. 30 tablet 6   No current facility-administered medications for this visit.    Allergies:   Amoxicillin, Metoprolol tartrate, and Oxaprozin   Social History:  The patient  reports that she has never smoked. She has never used smokeless tobacco. She reports current alcohol use. She reports that she does not use drugs.   Family History:  The patient's family history includes Diabetes in her brother; Heart attack in her mother; Heart failure in her maternal grandfather; Hypertension in her brother; Kidney disease in her brother.    ROS:  Please see the history of present illness.   Otherwise, review of systems is positive for none.   All other systems are reviewed and negative.   PHYSICAL EXAM: VS:  BP (!) 148/76   Pulse 94   Ht 5' (1.524 m)   Wt 155 lb (70.3 kg)   SpO2 97%   BMI 30.27 kg/m  , BMI Body mass index is 30.27 kg/m. GEN: Well nourished, well developed, in no acute distress  HEENT: normal  Neck: no JVD, carotid bruits, or masses Cardiac: RRR; no murmurs, rubs, or gallops,no edema  Respiratory:  clear to auscultation bilaterally, normal work of breathing GI: soft,  nontender, nondistended, + BS MS: no deformity or atrophy  Skin: warm and dry Neuro:  Strength and sensation are intact Psych: euthymic mood, full affect  EKG:  EKG is ordered today. Personal review of the ekg ordered shows junctional rhythm, rate 94  Recent Labs: 12/11/2018: ALT 19 12/19/2018: Magnesium 1.8 01/01/2019: BUN 11; Creatinine, Ser 0.76; Hemoglobin 9.6; Platelets 314; Potassium 4.4; Sodium 135    Lipid Panel  No results found for: CHOL, TRIG, HDL, CHOLHDL, VLDL, LDLCALC, LDLDIRECT   Wt Readings from Last  3 Encounters:  01/11/19 155 lb (70.3 kg)  01/08/19 152 lb (68.9 kg)  12/27/18 153 lb (69.4 kg)      Other studies Reviewed: Additional studies/ records that were reviewed today include: TTE 05/28/16  Review of the above records today demonstrates:  - Left ventricle: The cavity size was normal. There was mild   concentric hypertrophy. Systolic function was normal. The   estimated ejection fraction was in the range of 60% to 65%. Wall   motion was normal; there were no regional wall motion   abnormalities. Features are consistent with a pseudonormal left   ventricular filling pattern, with concomitant abnormal relaxation   and increased filling pressure (grade 2 diastolic dysfunction).   Doppler parameters are consistent with elevated ventricular   end-diastolic filling pressure. - Aortic valve: Trileaflet; mildly thickened leaflets. There was   mild regurgitation. - Aortic root: The aortic root was normal in size. - Mitral valve: Calcified annulus. Moderately thickened, moderately   calcified leaflets . The findings are consistent with moderate   stenosis. There was mild regurgitation. Mean gradient (D): 6 mm   Hg. Peak gradient (D): 12 mm Hg. - Left atrium: The atrium was severely dilated. - Right ventricle: Systolic function was normal. - Tricuspid valve: There was mild regurgitation. - Inferior vena cava: The vessel was normal in size. - Pericardium,  extracardiac: Features were not consistent with   tamponade physiology.   ASSESSMENT AND PLAN:  1.  Persistent atrial fibrillation/flutter: Status post ablation 03/10/2017 and 06/24/2017.  Currently on flecainide, Eliquis.  CHA2DS2-VASc 3.  He is now status post surgical ablation along with her mitral valve replacement.  We Barry Faircloth plan to stop her flecainide and start her on carvedilol.    2. Pericardial effusion: Without tamponade physiology.  3. Hypertension: Elevated today.  Planning to start carvedilol.  4.  Mitral stenosis: Status post minimally invasive mitral valve replacement with a bioprosthetic valve and Maze procedure.  Plan per primary cardiology.    Current medicines are reviewed at length with the patient today.   The patient does not have concerns regarding her medicines.  The following changes were made today: Stop flecainide, start carvedilol  Labs/ tests ordered today include:  Orders Placed This Encounter  Procedures  . EKG 12-Lead     Disposition:   FU with Missey Hasley 6 months  Signed, Lawana Hartzell Meredith Leeds, MD  01/11/2019 10:44 AM     [facility] [address] [place] [address] [phone] (office) [phone] (fax)

## 2019-01-17 ENCOUNTER — Ambulatory Visit (INDEPENDENT_AMBULATORY_CARE_PROVIDER_SITE_OTHER): Payer: Medicare Other | Admitting: Pharmacist Clinician (PhC)/ Clinical Pharmacy Specialist

## 2019-01-17 ENCOUNTER — Other Ambulatory Visit: Payer: Self-pay

## 2019-01-17 DIAGNOSIS — I480 Paroxysmal atrial fibrillation: Secondary | ICD-10-CM | POA: Diagnosis not present

## 2019-01-17 DIAGNOSIS — Z7901 Long term (current) use of anticoagulants: Secondary | ICD-10-CM | POA: Diagnosis not present

## 2019-01-17 LAB — POCT INR: INR: 2.6 (ref 2.0–3.0)

## 2019-01-22 ENCOUNTER — Emergency Department (HOSPITAL_COMMUNITY): Payer: Medicare Other

## 2019-01-22 ENCOUNTER — Telehealth (HOSPITAL_COMMUNITY): Payer: Self-pay | Admitting: Nurse Practitioner

## 2019-01-22 ENCOUNTER — Telehealth: Payer: Self-pay | Admitting: Cardiovascular Disease

## 2019-01-22 ENCOUNTER — Ambulatory Visit: Payer: Self-pay

## 2019-01-22 ENCOUNTER — Other Ambulatory Visit: Payer: Self-pay

## 2019-01-22 ENCOUNTER — Inpatient Hospital Stay (HOSPITAL_COMMUNITY)
Admission: EM | Admit: 2019-01-22 | Discharge: 2019-01-28 | DRG: 310 | Disposition: A | Discharge status: Home / Self Care | Payer: Medicare Other | Attending: Internal Medicine | Admitting: Internal Medicine

## 2019-01-22 DIAGNOSIS — I4819 Other persistent atrial fibrillation: Secondary | ICD-10-CM | POA: Diagnosis not present

## 2019-01-22 DIAGNOSIS — Z7982 Long term (current) use of aspirin: Secondary | ICD-10-CM

## 2019-01-22 DIAGNOSIS — I471 Supraventricular tachycardia, unspecified: Secondary | ICD-10-CM

## 2019-01-22 DIAGNOSIS — Z8249 Family history of ischemic heart disease and other diseases of the circulatory system: Secondary | ICD-10-CM

## 2019-01-22 DIAGNOSIS — G4733 Obstructive sleep apnea (adult) (pediatric): Secondary | ICD-10-CM | POA: Diagnosis present

## 2019-01-22 DIAGNOSIS — Z79899 Other long term (current) drug therapy: Secondary | ICD-10-CM

## 2019-01-22 DIAGNOSIS — I4892 Unspecified atrial flutter: Principal | ICD-10-CM | POA: Diagnosis present

## 2019-01-22 DIAGNOSIS — Z961 Presence of intraocular lens: Secondary | ICD-10-CM | POA: Diagnosis present

## 2019-01-22 DIAGNOSIS — Z9841 Cataract extraction status, right eye: Secondary | ICD-10-CM

## 2019-01-22 DIAGNOSIS — Z953 Presence of xenogenic heart valve: Secondary | ICD-10-CM

## 2019-01-22 DIAGNOSIS — I110 Hypertensive heart disease with heart failure: Secondary | ICD-10-CM | POA: Diagnosis present

## 2019-01-22 DIAGNOSIS — H409 Unspecified glaucoma: Secondary | ICD-10-CM | POA: Diagnosis present

## 2019-01-22 DIAGNOSIS — I350 Nonrheumatic aortic (valve) stenosis: Secondary | ICD-10-CM

## 2019-01-22 DIAGNOSIS — K219 Gastro-esophageal reflux disease without esophagitis: Secondary | ICD-10-CM | POA: Diagnosis present

## 2019-01-22 DIAGNOSIS — I10 Essential (primary) hypertension: Secondary | ICD-10-CM | POA: Diagnosis present

## 2019-01-22 DIAGNOSIS — Z9842 Cataract extraction status, left eye: Secondary | ICD-10-CM

## 2019-01-22 DIAGNOSIS — Z20822 Contact with and (suspected) exposure to covid-19: Secondary | ICD-10-CM | POA: Diagnosis present

## 2019-01-22 DIAGNOSIS — R0602 Shortness of breath: Secondary | ICD-10-CM

## 2019-01-22 DIAGNOSIS — R110 Nausea: Secondary | ICD-10-CM | POA: Diagnosis present

## 2019-01-22 DIAGNOSIS — I052 Rheumatic mitral stenosis with insufficiency: Secondary | ICD-10-CM | POA: Diagnosis present

## 2019-01-22 DIAGNOSIS — I455 Other specified heart block: Secondary | ICD-10-CM | POA: Diagnosis present

## 2019-01-22 DIAGNOSIS — I509 Heart failure, unspecified: Secondary | ICD-10-CM | POA: Diagnosis present

## 2019-01-22 DIAGNOSIS — F419 Anxiety disorder, unspecified: Secondary | ICD-10-CM | POA: Diagnosis present

## 2019-01-22 DIAGNOSIS — Z7901 Long term (current) use of anticoagulants: Secondary | ICD-10-CM

## 2019-01-22 DIAGNOSIS — Z90710 Acquired absence of both cervix and uterus: Secondary | ICD-10-CM

## 2019-01-22 DIAGNOSIS — R002 Palpitations: Secondary | ICD-10-CM | POA: Diagnosis present

## 2019-01-22 DIAGNOSIS — I440 Atrioventricular block, first degree: Secondary | ICD-10-CM | POA: Diagnosis present

## 2019-01-22 LAB — CBC
HCT: 39.4 % (ref 36.0–46.0)
Hemoglobin: 12 g/dL (ref 12.0–15.0)
MCH: 27.6 pg (ref 26.0–34.0)
MCHC: 30.5 g/dL (ref 30.0–36.0)
MCV: 90.8 fL (ref 80.0–100.0)
Platelets: 355 10*3/uL (ref 150–400)
RBC: 4.34 MIL/uL (ref 3.87–5.11)
RDW: 14.4 % (ref 11.5–15.5)
WBC: 8.2 10*3/uL (ref 4.0–10.5)
nRBC: 0 % (ref 0.0–0.2)

## 2019-01-22 LAB — BASIC METABOLIC PANEL
Anion gap: 10 (ref 5–15)
BUN: 12 mg/dL (ref 8–23)
CO2: 29 mmol/L (ref 22–32)
Calcium: 9.2 mg/dL (ref 8.9–10.3)
Chloride: 101 mmol/L (ref 98–111)
Creatinine, Ser: 0.78 mg/dL (ref 0.44–1.00)
GFR calc Af Amer: >60 mL/min (ref >60)
GFR calc non Af Amer: >60 mL/min (ref >60)
Glucose, Bld: 132 mg/dL — ABNORMAL HIGH (ref 70–99)
Potassium: 3.8 mmol/L (ref 3.5–5.1)
Sodium: 140 mmol/L (ref 135–145)

## 2019-01-22 LAB — PROTIME-INR
INR: 2.3 — ABNORMAL HIGH (ref 0.8–1.2)
Prothrombin Time: 25 seconds — ABNORMAL HIGH (ref 11.4–15.2)

## 2019-01-22 LAB — TROPONIN I (HIGH SENSITIVITY): Troponin I (High Sensitivity): 27 ng/L — ABNORMAL HIGH (ref <18)

## 2019-01-22 MED ORDER — SODIUM CHLORIDE 0.9% FLUSH
3.0000 mL | Freq: Once | INTRAVENOUS | Status: AC
  Administered 2019-01-25: 20:00:00 3 mL via INTRAVENOUS

## 2019-01-22 MED ORDER — ALPRAZOLAM 0.25 MG PO TABS
0.1250 mg | ORAL_TABLET | Freq: Two times a day (BID) | ORAL | Status: DC | PRN
  Administered 2019-01-23: 14:00:00 0.125 mg via ORAL
  Filled 2019-01-22: qty 1

## 2019-01-22 MED ORDER — FAMOTIDINE 20 MG PO TABS
20.0000 mg | ORAL_TABLET | Freq: Every day | ORAL | Status: DC
  Administered 2019-01-23 – 2019-01-24 (×2): 20 mg via ORAL
  Filled 2019-01-22 (×3): qty 1

## 2019-01-22 MED ORDER — WARFARIN - PHYSICIAN DOSING INPATIENT: Freq: Every day | Status: DC

## 2019-01-22 MED ORDER — ACETAMINOPHEN 500 MG PO TABS: 1000.0000 mg | ORAL_TABLET | Freq: Four times a day (QID) | ORAL | Status: DC | PRN

## 2019-01-22 MED ORDER — DOXEPIN HCL 10 MG PO CAPS
10.0000 mg | ORAL_CAPSULE | Freq: Every evening | ORAL | Status: DC | PRN
  Filled 2019-01-22: qty 2

## 2019-01-22 MED ORDER — ASPIRIN EC 81 MG PO TBEC
81.0000 mg | DELAYED_RELEASE_TABLET | Freq: Every day | ORAL | Status: DC
  Administered 2019-01-23 – 2019-01-28 (×6): 81 mg via ORAL
  Filled 2019-01-22 (×6): qty 1

## 2019-01-22 MED ORDER — ADENOSINE 6 MG/2ML IV SOLN
6.0000 mg | Freq: Once | INTRAVENOUS | Status: AC
  Administered 2019-01-22: 6 mg via INTRAVENOUS
  Filled 2019-01-22: qty 2

## 2019-01-22 MED ORDER — OXYBUTYNIN CHLORIDE ER 10 MG PO TB24
10.0000 mg | ORAL_TABLET | Freq: Every day | ORAL | Status: DC
  Administered 2019-01-23 – 2019-01-28 (×6): 10 mg via ORAL
  Filled 2019-01-22 (×7): qty 1

## 2019-01-22 MED ORDER — CARVEDILOL 12.5 MG PO TABS
12.5000 mg | ORAL_TABLET | Freq: Two times a day (BID) | ORAL | Status: DC
  Administered 2019-01-23 – 2019-01-28 (×11): 12.5 mg via ORAL
  Filled 2019-01-22 (×11): qty 1

## 2019-01-22 MED ORDER — WARFARIN SODIUM 2.5 MG PO TABS
2.5000 mg | ORAL_TABLET | Freq: Every day | ORAL | Status: DC
  Administered 2019-01-23 – 2019-01-27 (×5): 2.5 mg via ORAL
  Filled 2019-01-22 (×6): qty 1

## 2019-01-22 MED ORDER — SODIUM CHLORIDE 0.9 % IV BOLUS
1000.0000 mL | Freq: Once | INTRAVENOUS | Status: AC
  Administered 2019-01-22: 22:00:00 1000 mL via INTRAVENOUS

## 2019-01-22 NOTE — ED Triage Notes (Signed)
Pt says that she woke up and her heart rate was in the 160's, took coreg 6.25 x 2 today. Feels tightness in her chest. Hx afib. Mitral valve surgery in november.

## 2019-01-22 NOTE — ED Provider Notes (Signed)
[facility] EMERGENCY DEPARTMENT Provider Note   CSN: [id number] Arrival date & time: 01/22/19  2119     History Chief Complaint  Patient presents with  . Tachycardia    Mary Farrell is a 73 y.o. female with past medical history of MV replacement, PAF s/p MAZE, a-flutter, HTN, CHF, presenting to the ED with complaint of palpitations that began this morning.  Patient reports this morning she began feeling palpitations and noted that her heart rate was fast.  She states it persisted for a few hours and she called her A. fib clinic who recommended she take Coreg and report to the ED if symptoms did not improve.  She states however once she got dressed to come to the hospital her heart rate normalized into the 90s.  She states it returned again around 7 PM this evening.  She redosed with 2 doses of 6.25 mg Coreg.  She feels palpitations, some lightheadedness and mild chest tightness.  She is compliant on her Coumadin and has not missed any doses.  The history is provided by the patient.       Past Medical History:  Diagnosis Date  . Anxiety 06/29/2012  . Aortic regurgitation 08/13/2015  . Arthritis    "hands, wrists, back, feet, toes" (06/24/2017)  . Atrial flutter ([place]) 09/18/2015  . Atypical chest pain 05/13/2016  . Chest pain    remote cath in 1996 with NORMAL coronaries noted  . CHF (congestive heart failure) ([place])   . Dyspnea 10/30/2010  . Essential hypertension 09/05/2013  . Exogenous obesity    history of   . GERD (gastroesophageal reflux disease)   . Glaucoma, both eyes   . Headache, migraine    "stopped in my 50's" (09/18/2015)  . History of cardiovascular stress test 2007   showing no ischemia  . Hypertension   . Mitral stenosis and aortic insufficiency 06/23/2010  . Mitral stenosis with insufficiency   . Mitral stenosis with regurgitation   . Murmur    of mitral stenosis and moderate aortic insufficiency      . Nausea 05/01/2018  . OSA on CPAP   .  Palpitations    occasional  . Paroxysmal A-fib ([place]) 06/24/2017  . Pericardial effusion 09/18/2015  . Persistent atrial fibrillation ([place]) 03/10/2017  . Personal history of rheumatic heart disease   . S/P minimally-invasive maze operation for atrial fibrillation 12/13/2018   Complete bilateral atrial lesion set using cryothermy and bipolar radiofrequency ablation with clipping of LA appendage via right mini-thoracotomy approach  . S/P minimally-invasive mitral valve replacement with bioprosthetic valve 12/13/2018   31 mm [facility] Mitral stented bovine pericardial tissue valve  . SBE (subacute bacterial endocarditis)    prophylaxis, patient unaware  . Sliding hiatal hernia   . Vertigo 05/01/2018    Patient Active Problem List   Diagnosis Date Noted  . Supraventricular tachycardia ([place]) 01/22/2019  . S/P minimally-invasive mitral valve replacement with bioprosthetic valve + maze procedure 12/13/2018  . S/P minimally-invasive maze operation for atrial fibrillation 12/13/2018  . Mitral stenosis with regurgitation   . Long term (current) use of anticoagulants 10/23/2018  . Nausea 05/01/2018  . Vertigo 05/01/2018  . Lump on finger 12/30/2017  . Pain in finger 12/29/2017  . Paroxysmal A-fib ([place]) 06/24/2017  . Persistent atrial fibrillation 03/10/2017  . Asymptomatic microscopic hematuria 11/02/2016  . Rash 06/08/2016  . Atypical chest pain 05/13/2016  . Atrial flutter ([place]) 09/18/2015  . Pericardial effusion 09/18/2015  . Aortic regurgitation  08/13/2015  . Dysuria 09/24/2014  . IC (interstitial cystitis) 09/24/2014  . 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 Essential hypertension 09/05/2013  . Anxiety 06/29/2012  . GERD (gastroesophageal reflux disease) 03/11/2011  . Dyspnea 10/30/2010    Past Surgical History:  Procedure Laterality Date  . ABDOMINAL HYSTERECTOMY  1975  . APPENDECTOMY  1977  . ATRIAL FIBRILLATION ABLATION N/A 03/10/2017   Procedure: ATRIAL FIBRILLATION ABLATION;  Surgeon:  [name], MD;  Location: [place] CV LAB;  Service: Cardiovascular;  Laterality: N/A;  . ATRIAL FIBRILLATION ABLATION  06/24/2017  . ATRIAL FIBRILLATION ABLATION N/A 06/24/2017   Procedure: ATRIAL FIBRILLATION ABLATION;  Surgeon: [name], MD;  Location: [place] CV LAB;  Service: Cardiovascular;  Laterality: N/A;  . BUNIONECTOMY Right 2000s  . CARDIAC CATHETERIZATION  01/13/1995   normal coronary anatomy and mild mitral stenosis and mild pulmonary hypertension  . CARDIOVERSION N/A 09/22/2015   Procedure: CARDIOVERSION;  Surgeon: Jerline Pain, MD;  Location: [place];  Service: Cardiovascular;  Laterality: N/A;  . CARDIOVERSION N/A 10/25/2016   Procedure: CARDIOVERSION;  Surgeon: Fay Records, MD;  Location: [facility] ENDOSCOPY;  Service: Cardiovascular;  Laterality: N/A;  . CARDIOVERSION N/A 04/15/2017   Procedure: CARDIOVERSION;  Surgeon: Josue Hector, MD;  Location[facility] END[facility]COPY;  Service: Cardiovascular;  Laterality: N/A;  . CARDIOVERSION N/A 05/16/2017   Procedure: CARDIOVERSION;  Surgeon: Pixie Casino, MD;  Location: [facility] ENDOSCOPY;  Service: Cardiovascular;  Laterality: N/A;  . CATARACT EXTRACTION W/ INTRAOCULAR LENS  IMPLANT, BILATERAL Bilateral 2013  . COLONOSCOPY  2013  . EYE SURGERY Bilateral    cataracts  . LAPAROSCOPIC CHOLECYSTECTOMY  1997  . MINIMALLY INVASIVE MAZE PROCEDURE N/A 12/13/2018   Procedure: MINIMALLY INVASIVE MAZE PROCEDURE using a 45 MM AtriClip.;  Surgeon: Rexene Alberts, MD;  Location: [place];  Service: Open Heart Surgery;  Laterality: N/A;  . MITRAL VALVE REPLACEMENT Right 12/13/2018   Procedure: MINIMALLY INVASIVE MITRAL VALVE (MV) REPLACEMENT using a Magna Mitral Ease 31 MM Valve.;  Surgeon: Rexene Alberts, MD;  Location: [place];  Service: Open Heart Surgery;  Laterality: Right;  . RIGHT/LEFT HEART CATH AND CORONARY ANGIOGRAPHY N/A 10/18/2018   Procedure: RIGHT/LEFT HEART CATH AND CORONARY ANGIOGRAPHY;  Surgeon: Leonie Man,  MD;  Location: [place] CV LAB;  Service: Cardiovascular;  Laterality: N/A;  . TEE WITHOUT CARDIOVERSION N/A 06/23/2017   Procedure: TRANSESOPHAGEAL ECHOCARDIOGRAM (TEE);  Surgeon: Fay Records, MD;  Location: [place];  Service: Cardiovascular;  Laterality: N/A;  . TEE WITHOUT CARDIOVERSION N/A 10/17/2018   Procedure: TRANSESOPHAGEAL ECHOCARDIOGRAM (TEE);  Surgeon: Jerline Pain, MD;  Location: [facility] ENDOSCOPY;  Service: Cardiovascular;  Laterality: N/A;  . TEE WITHOUT CARDIOVERSION N/A 12/13/2018   Procedure: TRANSESOPHAGEAL ECHOCARDIOGRAM (TEE);  Surgeon: Rexene Alberts, MD;  Location: [place];  Service: Open Heart Surgery;  Laterality: N/A;  . TONSILLECTOMY AND ADENOIDECTOMY  1990s  . UVULOPALATOPHARYNGOPLASTY, TONSILLECTOMY AND SEPTOPLASTY  1990s     OB History   No obstetric history on file.     Family History  Problem Relation Age of Onset  . Heart attack Mother   . Hypertension Brother   . Kidney disease Brother   . Diabetes Brother   . Heart failure Maternal Grandfather     Social History   Tobacco Use  . Smoking status: Never Smoker  . Smokeless tobacco: Never Used  Substance Use Topics  . Alcohol use: Yes    Comment: rarely  . Drug use: No  Home Medications Prior to Admission medications   Medication Sig Start Date End Date Taking? Authorizing Provider  acetaminophen (TYLENOL) 500 MG tablet Take 1,000 mg by mouth every 6 (six) hours as needed for moderate pain or headache.     [provider]  ALPRAZolam Duanne Moron) 0.25 MG tablet Take 0.125 mg by mouth 2 (two) times daily as needed for anxiety.     [provider]  aspirin EC 81 MG EC tablet Take 1 tablet (81 mg total) by mouth daily. 12/20/18   Gold, Wayne E, PA-C  carvedilol (COREG) 6.25 MG tablet Take 1 tablet (6.25 mg total) by mouth 2 (two) times daily. 01/11/19   Rexene Alberts, MD  cetirizine (ZYRTEC) 10 MG tablet Take 10 mg by mouth at bedtime.     [provider]    Cholecalciferol (VITAMIN D) 2000 UNITS CAPS Take 2,000 Units by mouth daily.      [provider]  conjugated estrogens (PREMARIN) vaginal cream Place 1 Applicatorful vaginally daily as needed (irritation).     [provider]  dorzolamide-timolol (COSOPT) 22.3-6.8 MG/ML ophthalmic solution Place 1 drop into both eyes daily.    [provider]  doxepin (SINEQUAN) 10 MG capsule Take 10-20 mg by mouth at bedtime as needed (sleep).  06/10/17   [provider]  famotidine (PEPCID) 20 MG tablet Take 20 mg by mouth at bedtime.    [provider]  furosemide (LASIX) 40 MG tablet Take 1 tablet (40 mg total) by mouth daily. Take daily for 2 weeks, then take 1 tablet prn on days you notice weight gain of 3 lbs or more 01/08/19   Barrett, Erin R, PA-C  gabapentin (NEURONTIN) 300 MG capsule Take 300 mg by mouth 3 (three) times daily.     [provider]  lactobacillus acidophilus (BACID) TABS tablet Take 1 tablet by mouth daily.     [provider]  latanoprost (XALATAN) 0.005 % ophthalmic solution Place 1 drop into both eyes at bedtime.  06/17/14   [provider]  losartan (COZAAR) 25 MG tablet Take 25 mg by mouth daily.    [provider]  Multiple Vitamin (MULTIVITAMIN WITH MINERALS) TABS tablet Take 1 tablet by mouth daily.    [provider]  NON FORMULARY Apply 1 application topically 2 (two) times daily as needed (for eczema). Traimcinolone/[employer] Moist Cream    [provider]  omeprazole (PRILOSEC) 40 MG capsule Take 40 mg by mouth daily.     [provider]  oxybutynin (DITROPAN-XL) 10 MG 24 hr tablet Take 10 mg by mouth daily.  12/16/15   [provider]  polyethylene glycol (MIRALAX / GLYCOLAX) 17 g packet Take 17 g by mouth daily as needed for moderate constipation.     [provider]  potassium chloride (KLOR-CON) 10 MEQ tablet Take 1 tablet (10 mEq total) by mouth daily.  01/08/19   Barrett, Erin R, PA-C  PRESCRIPTION MEDICATION Inhale into the lungs at bedtime. CPAP    [provider]  warfarin (COUMADIN) 2.5 MG tablet Take 1 tablet (2.5 mg total) by mouth daily at 6 PM. Take 1 tablet by mouth daily or as directed by coumadin clinic Patient taking differently: Take 2.5 mg by mouth daily at 6 PM.  12/20/18   Gold, Wilder Glade, PA-C    Allergies    Amoxicillin, Metoprolol tartrate, and Oxaprozin  Review of Systems   Review of Systems  All other systems reviewed and are negative.  Physical Exam Updated Vital Signs BP 108/70   Pulse 89   Temp 98.3 F (36.8 C) (Oral)   Resp 17   SpO2 98%   Physical Exam Vitals and nursing note reviewed.  Constitutional:      General: She is not in acute distress.    Appearance: She is well-developed. She is not ill-appearing.  HENT:     Head: Normocephalic and atraumatic.  Eyes:     Conjunctiva/sclera: Conjunctivae normal.  Cardiovascular:     Rate and Rhythm: Regular rhythm. Tachycardia present.  Pulmonary:     Effort: Pulmonary effort is normal. No respiratory distress.     Breath sounds: Normal breath sounds.  Abdominal:     General: Bowel sounds are normal.     Palpations: Abdomen is soft.     Tenderness: There is no abdominal tenderness.  Musculoskeletal:     Right lower leg: No edema.     Left lower leg: No edema.  Skin:    General: Skin is warm.  Neurological:     Mental Status: She is alert.  Psychiatric:        Behavior: Behavior normal.     ED Results / Procedures / Treatments   Labs (all labs ordered are listed, but only abnormal results are displayed) Labs Reviewed  BASIC METABOLIC PANEL - Abnormal; Notable for the following components:      Result Value   Glucose, Bld 132 (*)    All other components within normal limits  PROTIME-INR - Abnormal; Notable for the following components:   Prothrombin Time 25.0 (*)    INR 2.3 (*)    All other components within normal limits    TROPONIN I (HIGH SENSITIVITY) - Abnormal; Notable for the following components:   Troponin I (High Sensitivity) 27 (*)    All other components within normal limits  SARS CORONAVIRUS 2 (TAT 6-24 HRS)  CBC  MAGNESIUM  PROTIME-INR  TROPONIN I (HIGH SENSITIVITY)    EKG EKG Interpretation  Date/Time:  Monday January 22 2019 22:40:27 EST Ventricular Rate:  134 PR Interval:    QRS Duration: 82 QT Interval:  324 QTC Calculation: 483 R Axis:   100 Text Interpretation: Undetermined rhythm Rightward axis Nonspecific ST abnormality Abnormal ECG Confirmed by Lennice Sites [phone]) on 01/22/2019 11:35:00 PM   Radiology DG Chest Port 1 View  Result Date: 01/22/2019 CLINICAL DATA:  Tachycardia, chest tightness, history of AFib EXAM: PORTABLE CHEST 1 VIEW COMPARISON:  01/08/2019 FINDINGS: Cardiomegaly with mitral valve prosthesis and left atrial appendage clip. Minimal linear scarring or atelectasis of the left midlung. The visualized skeletal structures are unremarkable. IMPRESSION: 1.  No acute abnormality of the lungs in AP portable projection. 2. Cardiomegaly with mitral valve prosthesis and left atrial appendage clip. Electronically Signed   By: Eddie Candle M.D.   On: 01/22/2019 21:59    Procedures .Critical Care Performed by: Rylan Bernard, [place] N, PA-C Authorized by: Mahoganie Basher, [place] N, PA-C   Critical care provider statement:    Critical care time (minutes):  45   Critical care time was exclusive of:  Separately billable procedures and treating other patients and teaching time   Critical care was necessary to treat or prevent imminent or life-threatening deterioration of the following conditions:  Cardiac failure and circulatory failure   Critical care was time spent personally by me on the following activities:  Discussions with consultants, evaluation of patient's response to treatment, examination of patient, ordering and performing treatments and interventions, ordering and review  of laboratory studies, ordering and review of radiographic studies, pulse oximetry, re-evaluation of patient's condition, obtaining history from patient or surrogate and review of old charts   I assumed direction of critical care for this patient from another provider in my specialty: no     (including critical care time)  Medications Ordered in ED Medications  sodium chloride flush (NS) 0.9 % injection 3 mL (has no administration in time range)  acetaminophen (TYLENOL) tablet 1,000 mg (has no administration in time range)  aspirin EC tablet 81 mg (has no administration in time range)  carvedilol (COREG) tablet 12.5 mg (has no administration in time range)  ALPRAZolam (XANAX) tablet 0.125 mg (has no administration in time range)  doxepin (SINEQUAN) capsule 10-20 mg (has no administration in time range)  famotidine (PEPCID) tablet 20 mg (has no administration in time range)  oxybutynin (DITROPAN-XL) 24 hr tablet 10 mg (has no administration in time range)  warfarin (COUMADIN) tablet 2.5 mg (has no administration in time range)  Warfarin - Physician Dosing Inpatient (has no administration in time range)  sodium chloride 0.9 % bolus 1,000 mL (1,000 mLs Intravenous New Bag/Given 01/22/19 2208)  adenosine (ADENOCARD) 6 MG/2ML injection 6 mg (6 mg Intravenous Given 01/22/19 2245)    ED Course  I have reviewed the triage vital signs and the nursing notes.  Pertinent labs & imaging results that were available during my care of the patient were reviewed by me and considered in my medical decision making (see chart for details).  Clinical Course as of Jan 21 2345  Mon Jan 22, 2019  2204 Dr. Marletta Lor with cardiology reviewed EKG and discussed interventions. Recommends Svt vs fast 2:1 flutter. Recommends adenosine   [JR]  2255 Pt given 6mg  adenosine with rate now down into 90s. Pt's palpitations improved. Cardiology Dr. Marletta Lor at bedside during administration   [JR]  2307 Dr. Marletta Lor recommending admit  overnight, EP to see in morning. Pt agreeable to plan.   [JR]    Clinical Course User Index [JR] Karmela Bram, [place] N, PA-C   MDM Rules/Calculators/A&P                      Pt with PMHx A. fib/a flutter status post maze procedure, chronic anticoagulation on Coumadin, MV replacement, presenting to the ED with complaint of palpitations.  She had an episode of palpitations this morning which resolved with Coreg, however returned this evening.  She has some associated chest tightness and lightheadedness with her symptoms however reports this is mild.  Arrival, patient's EKG reveals heart rate in the 160s and appears to be SVT.  Attempted bedside vagal maneuvers however without resolve.  Patient's heart rate was able to slow a small amount in order to recapture new EKG.  Consulted with cardiology to review EKG for recommendations.  Recommends likely SVT versus fast 2-1 flutter though is unsure of possible competing rhythms with slightly slowed heart rate.  Recommends adenosine.  Troponin slightly elevated at 27.  INR appears to be therapeutic at 2.3.  6 mg of adenosine administered, heart rate normalized into the 80s and patient's palpitations and symptoms have resolved.  Unclear etiology of patient's arrhythmia, reviewed by cardiology.  Dr. Marletta Lor with cardiology recommends to cardiology to admit overnight for monitoring and further work-up.  Patient is agreeable at this time.  Final Clinical Impression(s) / ED Diagnoses Final diagnoses:  SVT (supraventricular tachycardia) ([place])    Rx / DC Orders ED Discharge Orders    None  Matias Thurman, [place] N, PA-C 01/22/19 [place], [place], DO 01/22/19 2357

## 2019-01-22 NOTE — Telephone Encounter (Signed)
Spoke with pt and scheduled appt for 01/23/2019 with Adline Peals, PA.  Roderic Palau, NP also spoke with pt regarding her HR and meds.

## 2019-01-22 NOTE — ED Provider Notes (Signed)
Medical screening examination/treatment/procedure(s) were conducted as a shared visit with non-physician practitioner(s) and myself.  I personally evaluated the patient during the encounter. Briefly, the patient is a 73 y.o. female with history of paroxysmal atrial fibrillation on Coumadin, status post mitral valve replacement who presents to the ED with palpitations.  Patient tachycardic in the 160s upon arrival but asymptomatic otherwise.  Has had palpitations on and off all day.  Took additional doses of Coreg with some intermittent relief.  Appears to have a supraventricular tachycardia.  Talked with cardiology on the phone and after failed vagal maneuvers did give patient 6 mg of adenosine which appeared to reset her rhythm.  Likely has an underlying atrial type tachycardia.  She is back in sinus rhythm after adenosine however cardiology would like to admit for observation and for electrophysiology consultation in the morning.  Lab work otherwise was unremarkable except for mild elevation of troponin likely in the setting of demand.  This chart was dictated using voice recognition software.  Despite best efforts to proofread,  errors can occur which can change the documentation meaning.     EKG Interpretation  Date/Time:  Monday January 22 2019 21:24:03 EST Ventricular Rate:  166 PR Interval:    QRS Duration: 80 QT Interval:  288 QTC Calculation: 478 R Axis:   94 Text Interpretation: Supraventricular tachycardia Rightward axis Cannot rule out Anterior infarct , age undetermined ST & T wave abnormality, consider inferior ischemia Abnormal ECG Confirmed by Lennice Sites [phone]) on 01/22/2019 9:39:42 PM          Lennice Sites, DO 01/22/19 2334

## 2019-01-22 NOTE — Telephone Encounter (Signed)
Mary Farrell reports palpitations and slight SOB since about 0900 this morning.  She denies CP and all other symptoms.  Her BP is 146/89 this AM. HR 163. Yesterday (and normally), her BP was 115/57 and HR 84.  On 12/17, she was switched from Flecainide to Coreg 6.25 mg BID per Dr. Curt Bears.  She cancelled her appointment with Dr. Roxy Manns today because her brother passed away and the funeral is this afternoon. She would really like to be able to attend. She understands she will be called with further instructions.

## 2019-01-22 NOTE — Telephone Encounter (Signed)
Discussed with Chanetta Marshall, NP. Instructed the patient to take Coreg 6.25 mg now (her HR is now 151). She will reassess HR in 4 hours and take another Coreg 6.25 mg if her HR is over 110-115.  ED precautions reviewed. She will attempt to go to the funeral this afternoon.  She also understands she will be called to arrange an OV with the Afib Clinic tomorrow/this week.

## 2019-01-22 NOTE — Telephone Encounter (Signed)
STAT if HR is under 50 or over 120 (normal HR is 60-100 beats per minute)  1) What is your heart rate?  163 2)  3) Do you have a log of your heart rate readings (document readings)? 4) Do you have any other symptoms?

## 2019-01-22 NOTE — Telephone Encounter (Signed)
Pt called the clinic and has taken coreg 6.25 mg at 8/10 and 2 today and her HR remains at 170 bpm with a systolic BP of [id number] . She is on warfarin. She had a low reading of 1.6, 3 weeks ago. Since the coreg is not touching her rapid v rate, she was advised to go to the ER for further management. Her flecainide was stopped mid month by  Dr. Curt Bears and coreg started. Marland Kitchen Marland Kitchen

## 2019-01-22 NOTE — ED Notes (Signed)
Adenosine given @ 2245. Capnography in place, pads on, ED provider, PA, and Cardiologist at bedside

## 2019-01-22 NOTE — H&P (Signed)
Cardiology Admission History and Physical:   Patient ID: Mary Farrell MRN: [id number]; DOB: 10-10-1945   Admission date: 01/22/2019  Primary Care Provider: Lawerance Cruel, MD Primary Cardiologist: Skeet Latch, MD  Primary Electrophysiologist:  Will Meredith Leeds, MD   Chief Complaint:  palpitations  Patient Profile:   Mary Farrell is a 73 y.o. female with valvular paroxysmal atrial fibrillation and atrial flutter in the context of mitral stenosis who is s/p minimally invasive bioprosthetic MVR and MAZE 11/2018 who presents with palpitations.   The patient was in her usual state of good health, when she awoke this morning with palpitations and noted that her HR was 160 on her BP monitor. She called the AF clinic who instructed her to take an extra dose of carvedilol, which unfortunately had no effect. She was in the process of dressing to come to the ER when she rechecked her pulse and noted it to be 90, but her palpitations returned shortly and she presented to the ER for evaluation after an additional dose of beta blocker. Of note, during her prior episodes of atrial fibrillation and aflutter her Hrs were typically 120s - 130s. She has never had a pulse this high during atrial arrhythmias before. Also of note, flecainide was recently discontinued (12/17) in favor of coreg. She is anticoagulated with warfarin and her INR today was 2.3.   Upon arrival, she was well appearing, normotensive with HR 160, regular, narrow complex tachycardia. With vagal manuevers, atrial activity became apparent with what looked like alternating beats of SR with 1st degree AVB and beats with retrograde p-wave conduction. Multiple modified vagal manuevers were performed but we were unable to break this presumed SVT. 6mg  of adenosine was administered peripherally with brief AV nodal block followed by slow return of SR with 1st degree AV block.    Past Medical History:  Diagnosis Date   Anxiety 06/29/2012     Aortic regurgitation 08/13/2015   Arthritis    "hands, wrists, back, feet, toes" (06/24/2017)   Atrial flutter ([place]) 09/18/2015   Atypical chest pain 05/13/2016   Chest pain    remote cath in 1996 with NORMAL coronaries noted   CHF (congestive heart failure) ([place])    Dyspnea 10/30/2010   Essential hypertension 09/05/2013   Exogenous obesity    history of    GERD (gastroesophageal reflux disease)    Glaucoma, both eyes    Headache, migraine    "stopped in my 87's" (09/18/2015)   History of cardiovascular stress test 2007   showing no ischemia   Hypertension    Mitral stenosis and aortic insufficiency 06/23/2010   Mitral stenosis with insufficiency    Mitral stenosis with regurgitation    Murmur    of mitral stenosis and moderate aortic insufficiency       Nausea 05/01/2018   OSA on CPAP    Palpitations    occasional   Paroxysmal A-fib ([place]) 06/24/2017   Pericardial effusion 09/18/2015   Persistent atrial fibrillation ([place]) 03/10/2017   Personal history of rheumatic heart disease    S/P minimally-invasive maze operation for atrial fibrillation 12/13/2018   Complete bilateral atrial lesion set using cryothermy and bipolar radiofrequency ablation with clipping of LA appendage via right mini-thoracotomy approach   S/P minimally-invasive mitral valve replacement with bioprosthetic valve 12/13/2018   31 mm [facility] Mitral stented bovine pericardial tissue valve   SBE (subacute bacterial endocarditis)    prophylaxis, patient unaware   Sliding hiatal hernia    Vertigo  05/01/2018    Past Surgical History:  Procedure Laterality Date   ABDOMINAL HYSTERECTOMY  1975   APPENDECTOMY  1977   ATRIAL FIBRILLATION ABLATION N/A 03/10/2017   Procedure: ATRIAL FIBRILLATION ABLATION;  Surgeon: [name], MD;  Location: [place] [facility];  Service: Cardiovascular;  Laterality: N/A;   ATRIAL FIBRILLATION ABLATION  06/24/2017   ATRIAL FIBRILLATION ABLATION  N/A 06/24/2017   Procedure: ATRIAL FIBRILLATION ABLATION;  Surgeon: [name], MD;  Location: [place] [facility];  Service: Cardiovascular;  Laterality: N/A;   BUNIONECTOMY Right 2000s   CARDIAC CATHETERIZATION  01/13/1995   normal coronary anatomy and mild mitral stenosis and mild pulmonary hypertension   CARDIOVERSION N/A 09/22/2015   Procedure: CARDIOVERSION;  Surgeon: Jerline Pain, MD;  Location: [place];  Service: Cardiovascular;  Laterality: N/A;   CARDIOVERSION N/A 10/25/2016   Procedure: CARDIOVERSION;  Surgeon: Fay Records, MD;  Location: [place];  Service: Cardiovascular;  Laterality: N/A;   CARDIOVERSION N/A 04/15/2017   Procedure: CARDIOVERSION;  Surgeon: Josue Hector, MD;  Location: [facility] END[facility];  Service: Cardiovascular;  Laterality: N/A;   CARDIOVERSION N/A 05/16/2017   Procedure: CARDIOVERSION;  Surgeon: Pixie Casino, MD;  Location: [facility] ENDOSCOPY;  Service: Cardiovascular;  Laterality: N/A;   CATARACT EXTRACTION W/ INTRAOCULAR LENS  IMPLANT, BILATERAL Bilateral 2013   COLONOSCOPY  2013   EYE SURGERY Bilateral    cataracts   LAPAROSCOPIC CHOLECYSTECTOMY  1997   MINIMALLY INVASIVE MAZE PROCEDURE N/A 12/13/2018   Procedure: MINIMALLY INVASIVE MAZE PROCEDURE using a 45 MM AtriClip.;  Surgeon: Rexene Alberts, MD;  Location: [place];  Service: Open Heart Surgery;  Laterality: N/A;   MITRAL VALVE REPLACEMENT Right 12/13/2018   Procedure: MINIMALLY INVASIVE MITRAL VALVE (MV) REPLACEMENT using a Magna Mitral Ease 31 MM Valve.;  Surgeon: Rexene Alberts, MD;  Location: [place];  Service: Open Heart Surgery;  Laterality: Right;   RIGHT/LEFT HEART CATH AND CORONARY ANGIOGRAPHY N/A 10/18/2018   Procedure: RIGHT/LEFT HEART CATH AND CORONARY ANGIOGRAPHY;  Surgeon: Leonie Man, MD;  Location: [place] [facility];  Service: Cardiovascular;  Laterality: N/A;   TEE WITHOUT CARDIOVERSION N/A 06/23/2017   Procedure: TRANSESOPHAGEAL ECHOCARDIOGRAM (TEE);   Surgeon: Fay Records, MD;  Location: [place];  Service: Cardiovascular;  Laterality: N/A;   TEE WITHOUT CARDIOVERSION N/A 10/17/2018   Procedure: TRANSESOPHAGEAL ECHOCARDIOGRAM (TEE);  Surgeon: Jerline Pain, MD;  Location: [facility] ENDOSCOPY;  Service: Cardiovascular;  Laterality: N/A;   TEE WITHOUT CARDIOVERSION N/A 12/13/2018   Procedure: TRANSESOPHAGEAL ECHOCARDIOGRAM (TEE);  Surgeon: Rexene Alberts, MD;  Location: [place];  Service: Open Heart Surgery;  Laterality: N/A;   TONSILLECTOMY AND ADENOIDECTOMY  1990s   UVULOPALATOPHARYNGOPLASTY, TONSILLECTOMY AND SEPTOPLASTY  1990s     Medications Prior to Admission: Prior to Admission medications   Medication Sig Start Date End Date Taking? Authorizing Provider  acetaminophen (TYLENOL) 500 MG tablet Take 1,000 mg by mouth every 6 (six) hours as needed for moderate pain or headache.     [provider]  ALPRAZolam Duanne Moron) 0.25 MG tablet Take 0.125 mg by mouth 2 (two) times daily as needed for anxiety.     [provider]  aspirin EC 81 MG EC tablet Take 1 tablet (81 mg total) by mouth daily. 12/20/18   Gold, Wayne E, PA-C  carvedilol (COREG) 6.25 MG tablet Take 1 tablet (6.25 mg total) by mouth 2 (two) times daily. 01/11/19   Rexene Alberts, MD  cetirizine (ZYRTEC) 10  MG tablet Take 10 mg by mouth at bedtime.     [provider]  Cholecalciferol (VITAMIN D) 2000 UNITS CAPS Take 2,000 Units by mouth daily.      [provider]  conjugated estrogens (PREMARIN) vaginal cream Place 1 Applicatorful vaginally daily as needed (irritation).     [provider]  dorzolamide-timolol (COSOPT) 22.3-6.8 MG/ML ophthalmic solution Place 1 drop into both eyes daily.    [provider]  doxepin (SINEQUAN) 10 MG capsule Take 10-20 mg by mouth at bedtime as needed (sleep).  06/10/17   [provider]  famotidine (PEPCID) 20 MG tablet Take 20 mg by mouth at bedtime.    [provider]    furosemide (LASIX) 40 MG tablet Take 1 tablet (40 mg total) by mouth daily. Take daily for 2 weeks, then take 1 tablet prn on days you notice weight gain of 3 lbs or more 01/08/19   Barrett, Erin R, PA-C  gabapentin (NEURONTIN) 300 MG capsule Take 300 mg by mouth 3 (three) times daily.     [provider]  lactobacillus acidophilus (BACID) TABS tablet Take 1 tablet by mouth daily.     [provider]  latanoprost (XALATAN) 0.005 % ophthalmic solution Place 1 drop into both eyes at bedtime.  06/17/14   [provider]  losartan (COZAAR) 25 MG tablet Take 25 mg by mouth daily.    [provider]  Multiple Vitamin (MULTIVITAMIN WITH MINERALS) TABS tablet Take 1 tablet by mouth daily.    [provider]  NON FORMULARY Apply 1 application topically 2 (two) times daily as needed (for eczema). Traimcinolone/[employer] Moist Cream    [provider]  omeprazole (PRILOSEC) 40 MG capsule Take 40 mg by mouth daily.     [provider]  oxybutynin (DITROPAN-XL) 10 MG 24 hr tablet Take 10 mg by mouth daily.  12/16/15   [provider]  polyethylene glycol (MIRALAX / GLYCOLAX) 17 g packet Take 17 g by mouth daily as needed for moderate constipation.     [provider]  potassium chloride (KLOR-CON) 10 MEQ tablet Take 1 tablet (10 mEq total) by mouth daily. 01/08/19   Barrett, Erin R, PA-C  PRESCRIPTION MEDICATION Inhale into the lungs at bedtime. CPAP    [provider]  warfarin (COUMADIN) 2.5 MG tablet Take 1 tablet (2.5 mg total) by mouth daily at 6 PM. Take 1 tablet by mouth daily or as directed by coumadin clinic Patient taking differently: Take 2.5 mg by mouth daily at 6 PM.  12/20/18   Gold, Wilder Glade, PA-C     Allergies:    Allergies  Allergen Reactions   Amoxicillin Diarrhea    Has patient had a PCN reaction causing immediate rash, facial/tongue/throat swelling, SOB or lightheadedness with hypotension: no Has patient  had a PCN reaction causing severe rash involving mucus membranes or skin necrosis: no Has patient had a PCN reaction that required hospitalization: no Has patient had a PCN reaction occurring within the last 10 years: no If all of the above answers are "NO", then may proceed with Cephalosporin use.    Metoprolol Tartrate Other (See Comments)    Mouth sores "mouth tastes like mold"   Oxaprozin Nausea Only    Social History:   Social History   Socioeconomic History   Marital status: Married    Spouse name: Not on file   Number of children: Not on file   Years of education: Not on file  Highest education level: Not on file  Occupational History   Occupation: Retired  Tobacco Use   Smoking status: Never Smoker   Smokeless tobacco: Never Used  Substance and Sexual Activity   Alcohol use: Yes    Comment: rarely   Drug use: No   Sexual activity: Not Currently  Other Topics Concern   Not on file  Social History Narrative   Lives with husband in [place].   Social Determinants of Health   Financial Resource Strain:    Difficulty of Paying Living Expenses: Not on file  Food Insecurity:    Worried About Charity fundraiser in the Last Year: Not on file   [employer] of Food in the Last Year: Not on file  Transportation Needs:    Lack of Transportation (Medical): Not on file   Lack of Transportation (Non-Medical): Not on file  Physical Activity:    Days of Exercise per Week: Not on file   Minutes of Exercise per Session: Not on file  Stress:    Feeling of Stress : Not on file  Social Connections:    Frequency of Communication with Friends and Family: Not on file   Frequency of Social Gatherings with Friends and Family: Not on file   Attends Religious Services: Not on file   Active Member of Clubs or Organizations: Not on file   Attends Archivist Meetings: Not on file   Marital Status: Not on file  Intimate Partner Violence:    Fear  of Current or Ex-Partner: Not on file   Emotionally Abused: Not on file   Physically Abused: Not on file   Sexually Abused: Not on file    Family History:   The patient's family history includes Diabetes in her brother; Heart attack in her mother; Heart failure in her maternal grandfather; Hypertension in her brother; Kidney disease in her brother.    Review of Systems: [y] = yes, [ ]  = no     General: Weight gain [ ] ; Weight loss [ ] ; Anorexia [ ] ; Fatigue [ ] ; Fever [ ] ; Chills [ ] ; Weakness [ ]    Cardiac: Chest pain/pressure [ ] ; Resting SOB [ ] ; Exertional SOB [ ] ; Orthopnea [ ] ; Pedal Edema [ ] ; Palpitations Blue.Reese ]; Syncope [ ] ; Presyncope [ ] ; Paroxysmal nocturnal dyspnea[ ]    Pulmonary: Cough [ ] ; Wheezing[ ] ; Hemoptysis[ ] ; Sputum [ ] ; Snoring [ ]    GI: Vomiting[ ] ; Dysphagia[ ] ; Melena[ ] ; Hematochezia [ ] ; Heartburn[ ] ; Abdominal pain [ ] ; Constipation [ ] ; Diarrhea [ ] ; BRBPR [ ]    GU: Hematuria[ ] ; Dysuria [ ] ; Nocturia[ ]    Vascular: Pain in legs with walking [ ] ; Pain in feet with lying flat [ ] ; Non-healing sores [ ] ; Stroke [ ] ; TIA [ ] ; Slurred speech [ ] ;   Neuro: Headaches[ ] ; Vertigo[ ] ; Seizures[ ] ; Paresthesias[ ] ;Blurred vision [ ] ; Diplopia [ ] ; Vision changes [ ]    Ortho/Skin: Arthritis [ ] ; Joint pain [ ] ; Muscle pain [ ] ; Joint swelling [ ] ; Back Pain [ ] ; Rash [ ]    Psych: Depression[ ] ; Anxiety[ ]    Heme: Bleeding problems [ ] ; Clotting disorders [ ] ; Anemia [ ]    Endocrine: Diabetes [ ] ; Thyroid dysfunction[ ]   Physical Exam/Data:   Vitals:   01/22/19 2255 01/22/19 2257 01/22/19 2300 01/22/19 2305  BP: (!) 123/59 114/60 (!) 109/59 103/61  Pulse: 89 89 89 88  Resp: 17 (!) 21 17 (!) 21  Temp:      TempSrc:      SpO2: 97% 97% 96% 97%   No intake or output data in the 24 hours ending 01/22/19 2317 There were no vitals filed for this visit. There is no height or weight on file to calculate BMI.  General:  Well nourished, well developed, in no  acute distress. HEENT: normal. Dry mucous membranes.  Lymph: no adenopathy Neck: no JVD. Endocrine:  No thryomegaly Vascular: No carotid bruits; FA pulses 2+ bilaterally without bruits  Cardiac:  normal S1, S2; RRR; no murmur Lungs:  clear to auscultation bilaterally, no wheezing, rhonchi or rales  Abd: soft, nontender, no hepatomegaly  Ext: no edema Musculoskeletal:  No deformities, BUE and BLE strength normal and equal Skin: warm and dry  Neuro:  CNs 2-12 intact, no focal abnormalities noted Psych:  Normal affect   EKG:    Relevant CV Studies: (pre-MVR) - TTE 09/2018  1. Left ventricular ejection fraction, by visual estimation, is 60 to 65%. The left ventricle has normal function. Normal left ventricular size. There is no left ventricular hypertrophy.  2. Global right ventricle has normal systolic function.The right ventricular size is normal. No increase in right ventricular wall thickness.  3. Left atrial size was severely dilated.  4. Right atrial size was normal.  5. Small pericardial effusion.  6. Mild calcification of the posterior mitral valve leaflet(s).  7. Moderate thickening of the posterior mitral valve leaflet(s).  8. Moderately decreased mobility of the mitral valve leaflets.  9. The mitral valve is rheumatic. Mild mitral valve regurgitation. Moderate-severe mitral stenosis. 10. Mean transmitral gradient is 58mmHg.     Viewed personally TEE from 2019 and value leaflets appear thin and non calcified. Harmonics on current echocardiogram may be causing artifactual thickening. 11. The tricuspid valve is normal in structure. Tricuspid valve regurgitation was not visualized by color flow Doppler. 12. The aortic valve is normal in structure. Aortic valve regurgitation is mild by color flow Doppler. Mild aortic valve sclerosis without stenosis. 13. The pulmonic valve was normal in structure. Pulmonic valve regurgitation is trivial by color flow Doppler. 14. Mildly elevated  pulmonary artery systolic pressure. 15. The inferior vena cava is normal in size with greater than 50% respiratory variability, suggesting right atrial pressure of 3 mmHg. 16. Rheumatic mitral valve stenosis. Transmitral gradient has increased. Consider repeat TEE and stress echo to evaluate transmitral gradient during exercise. May need a candidate for balloon valvuloplasty.  Laboratory Data:  Chemistry Recent Labs  Lab 01/22/19 2131  NA 140  K 3.8  CL 101  CO2 29  GLUCOSE 132*  BUN 12  CREATININE 0.78  CALCIUM 9.2  GFRNONAA >60  GFRAA >60  ANIONGAP 10    No results for input(s): PROT, ALBUMIN, AST, ALT, ALKPHOS, BILITOT in the last 168 hours. Hematology Recent Labs  Lab 01/22/19 2131  WBC 8.2  RBC 4.34  HGB 12.0  HCT 39.4  MCV 90.8  MCH 27.6  MCHC 30.5  RDW 14.4  PLT 355   Cardiac EnzymesNo results for input(s): TROPONINI in the last 168 hours. No results for input(s): TROPIPOC in the last 168 hours.  BNPNo results for input(s): BNP, PROBNP in the last 168 hours.  DDimer No results for input(s): DDIMER in the last 168 hours.  Radiology/Studies:  DG Chest Port 1 View  Result Date: 01/22/2019 CLINICAL DATA:  Tachycardia, chest tightness, history of AFib EXAM: PORTABLE CHEST 1 VIEW COMPARISON:  01/08/2019 FINDINGS: Cardiomegaly with mitral valve prosthesis  and left atrial appendage clip. Minimal linear scarring or atelectasis of the left midlung. The visualized skeletal structures are unremarkable. IMPRESSION: 1.  No acute abnormality of the lungs in AP portable projection. 2. Cardiomegaly with mitral valve prosthesis and left atrial appendage clip. Electronically Signed   By: Eddie Candle M.D.   On: 01/22/2019 21:59    Assessment and Plan:   Ms. 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 I suspect his is related to recent MAZE procedure / cardiac surgery either from MAZE creating favorable substrate for these arrhythmias. Will plan to admit for overnight observation to ensure no recurrent of SVT and to have EP discuss medication changes v. Ablation in the AM.   #AVNRT #pAF s/p  MAZE #Mitral Stenosis s/p bioprosthetic MVR -- Increase home carvedilol to 12.5 mg BID (which she tolerated today).  -- Continue AC with warfarin 2.5 mg qHS -- On ASA 81mg  daily -- TTE in the AM  -- EP consult in the AM -- Telemetry monitoring overnight  Severity of Illness: The appropriate patient status for this patient is OBSERVATION. Observation status is judged to be reasonable and necessary in order to provide the required intensity of service to ensure the patient's safety. The patient's presenting symptoms, physical exam findings, and initial radiographic and laboratory data in the context of their medical condition is felt to place them at decreased risk for further clinical deterioration. Furthermore, it is anticipated that the patient will be medically stable for discharge from the hospital within 2 midnights of admission. The following factors support the patient status of observation.   " The patient's presenting symptoms include palpitaitons.  " The physical exam findings include tachycardia.  " The initial radiographic and laboratory data are reassuring.   For questions or updates, please contact [place] Please consult [email or web address] for contact info under   Signed, Milus Banister, MD  01/22/2019 11:17 PM

## 2019-01-23 ENCOUNTER — Ambulatory Visit: Payer: Self-pay

## 2019-01-23 ENCOUNTER — Observation Stay (HOSPITAL_COMMUNITY): Payer: Medicare Other

## 2019-01-23 ENCOUNTER — Ambulatory Visit (HOSPITAL_COMMUNITY): Payer: Medicare Other | Admitting: Physician Assistant

## 2019-01-23 ENCOUNTER — Encounter (HOSPITAL_COMMUNITY): Payer: Self-pay

## 2019-01-23 ENCOUNTER — Encounter (HOSPITAL_COMMUNITY): Payer: Self-pay | Admitting: Internal Medicine

## 2019-01-23 ENCOUNTER — Other Ambulatory Visit: Payer: Self-pay

## 2019-01-23 DIAGNOSIS — I4892 Unspecified atrial flutter: Secondary | ICD-10-CM | POA: Diagnosis present

## 2019-01-23 DIAGNOSIS — I10 Essential (primary) hypertension: Secondary | ICD-10-CM | POA: Diagnosis not present

## 2019-01-23 DIAGNOSIS — I361 Nonrheumatic tricuspid (valve) insufficiency: Secondary | ICD-10-CM

## 2019-01-23 DIAGNOSIS — F419 Anxiety disorder, unspecified: Secondary | ICD-10-CM | POA: Diagnosis present

## 2019-01-23 DIAGNOSIS — I471 Supraventricular tachycardia: Secondary | ICD-10-CM | POA: Diagnosis present

## 2019-01-23 DIAGNOSIS — R110 Nausea: Secondary | ICD-10-CM | POA: Diagnosis present

## 2019-01-23 DIAGNOSIS — Z9841 Cataract extraction status, right eye: Secondary | ICD-10-CM | POA: Diagnosis not present

## 2019-01-23 DIAGNOSIS — I4819 Other persistent atrial fibrillation: Secondary | ICD-10-CM

## 2019-01-23 DIAGNOSIS — R002 Palpitations: Secondary | ICD-10-CM | POA: Diagnosis present

## 2019-01-23 DIAGNOSIS — H409 Unspecified glaucoma: Secondary | ICD-10-CM | POA: Diagnosis present

## 2019-01-23 DIAGNOSIS — Z8249 Family history of ischemic heart disease and other diseases of the circulatory system: Secondary | ICD-10-CM | POA: Diagnosis not present

## 2019-01-23 DIAGNOSIS — I110 Hypertensive heart disease with heart failure: Secondary | ICD-10-CM | POA: Diagnosis present

## 2019-01-23 DIAGNOSIS — I484 Atypical atrial flutter: Secondary | ICD-10-CM | POA: Diagnosis not present

## 2019-01-23 DIAGNOSIS — I455 Other specified heart block: Secondary | ICD-10-CM | POA: Diagnosis present

## 2019-01-23 DIAGNOSIS — Z79899 Other long term (current) drug therapy: Secondary | ICD-10-CM | POA: Diagnosis not present

## 2019-01-23 DIAGNOSIS — Z90710 Acquired absence of both cervix and uterus: Secondary | ICD-10-CM | POA: Diagnosis not present

## 2019-01-23 DIAGNOSIS — Z961 Presence of intraocular lens: Secondary | ICD-10-CM | POA: Diagnosis present

## 2019-01-23 DIAGNOSIS — K219 Gastro-esophageal reflux disease without esophagitis: Secondary | ICD-10-CM | POA: Diagnosis present

## 2019-01-23 DIAGNOSIS — G4733 Obstructive sleep apnea (adult) (pediatric): Secondary | ICD-10-CM | POA: Diagnosis present

## 2019-01-23 DIAGNOSIS — I440 Atrioventricular block, first degree: Secondary | ICD-10-CM | POA: Diagnosis present

## 2019-01-23 DIAGNOSIS — I351 Nonrheumatic aortic (valve) insufficiency: Secondary | ICD-10-CM

## 2019-01-23 DIAGNOSIS — Z7982 Long term (current) use of aspirin: Secondary | ICD-10-CM | POA: Diagnosis not present

## 2019-01-23 DIAGNOSIS — Z20822 Contact with and (suspected) exposure to covid-19: Secondary | ICD-10-CM | POA: Diagnosis not present

## 2019-01-23 DIAGNOSIS — Z9842 Cataract extraction status, left eye: Secondary | ICD-10-CM | POA: Diagnosis not present

## 2019-01-23 DIAGNOSIS — Z953 Presence of xenogenic heart valve: Secondary | ICD-10-CM | POA: Diagnosis not present

## 2019-01-23 DIAGNOSIS — Z7901 Long term (current) use of anticoagulants: Secondary | ICD-10-CM | POA: Diagnosis not present

## 2019-01-23 DIAGNOSIS — I509 Heart failure, unspecified: Secondary | ICD-10-CM | POA: Diagnosis present

## 2019-01-23 LAB — TROPONIN I (HIGH SENSITIVITY): Troponin I (High Sensitivity): 21 ng/L — ABNORMAL HIGH (ref <18)

## 2019-01-23 LAB — ECHOCARDIOGRAM COMPLETE
Height: 60 in
Weight: 2440.93 oz

## 2019-01-23 LAB — MRSA PCR SCREENING: MRSA by PCR: NEGATIVE

## 2019-01-23 LAB — MAGNESIUM: Magnesium: 1.9 mg/dL (ref 1.7–2.4)

## 2019-01-23 LAB — SARS CORONAVIRUS 2 (TAT 6-24 HRS): SARS Coronavirus 2: NEGATIVE

## 2019-01-23 LAB — PROTIME-INR
INR: 2.6 — ABNORMAL HIGH (ref 0.8–1.2)
Prothrombin Time: 27.7 seconds — ABNORMAL HIGH (ref 11.4–15.2)

## 2019-01-23 MED ORDER — ONDANSETRON HCL 4 MG/2ML IJ SOLN
4.0000 mg | Freq: Four times a day (QID) | INTRAMUSCULAR | Status: DC | PRN
  Administered 2019-01-23: 20:00:00 4 mg via INTRAVENOUS
  Filled 2019-01-23: qty 2

## 2019-01-23 MED ORDER — WARFARIN - PHARMACIST DOSING INPATIENT: Freq: Every day | Status: DC

## 2019-01-23 MED ORDER — METOPROLOL TARTRATE 5 MG/5ML IV SOLN
5.0000 mg | Freq: Once | INTRAVENOUS | Status: AC
  Administered 2019-01-23: 14:00:00 5 mg via INTRAVENOUS
  Filled 2019-01-23: qty 5

## 2019-01-23 MED ORDER — ALPRAZOLAM 0.25 MG PO TABS
0.2500 mg | ORAL_TABLET | Freq: Two times a day (BID) | ORAL | Status: DC | PRN
  Administered 2019-01-23 – 2019-01-28 (×8): 0.25 mg via ORAL
  Filled 2019-01-23 (×8): qty 1

## 2019-01-23 MED ORDER — ZOLPIDEM TARTRATE 5 MG PO TABS
5.0000 mg | ORAL_TABLET | Freq: Every evening | ORAL | Status: DC | PRN
  Administered 2019-01-23 – 2019-01-27 (×5): 5 mg via ORAL
  Filled 2019-01-23 (×5): qty 1

## 2019-01-23 MED ORDER — FLECAINIDE ACETATE 100 MG PO TABS
100.0000 mg | ORAL_TABLET | Freq: Two times a day (BID) | ORAL | Status: DC
  Administered 2019-01-23 – 2019-01-24 (×3): 100 mg via ORAL
  Filled 2019-01-23 (×3): qty 1

## 2019-01-23 MED ORDER — ACETAMINOPHEN 325 MG PO TABS: 650.0000 mg | ORAL_TABLET | ORAL | Status: DC | PRN

## 2019-01-23 NOTE — Progress Notes (Signed)
ANTICOAGULATION CONSULT NOTE - Initial Consult  Pharmacy Consult for warfarin Indication: afib and bioMVR  Allergies  Allergen Reactions  . Amoxicillin Diarrhea    Has patient had a PCN reaction causing immediate rash, facial/tongue/throat swelling, SOB or lightheadedness with hypotension: no Has patient had a PCN reaction causing severe rash involving mucus membranes or skin necrosis: no Has patient had a PCN reaction that required hospitalization: no Has patient had a PCN reaction occurring within the last 10 years: no If all of the above answers are "NO", then may proceed with Cephalosporin use.   . Metoprolol Tartrate Other (See Comments)    Mouth sores "mouth tastes like mold"  . Oxaprozin Nausea Only    Patient Measurements: Height: 5' (152.4 cm) Weight: 152 lb 8.9 oz (69.2 kg) IBW/kg (Calculated) : 45.5   Vital Signs: Temp: 97.9 F (36.6 C) (12/29 1108) Temp Source: Oral (12/29 1108) BP: 127/73 (12/29 1325) Pulse Rate: 98 (12/29 1108)  Labs: Recent Labs    01/22/19 2131 01/22/19 2329 01/23/19 0801  HGB 12.0  --   --   HCT 39.4  --   --   PLT 355  --   --   LABPROT 25.0*  --  27.7*  INR 2.3*  --  2.6*  CREATININE 0.78  --   --   TROPONINIHS 27* 21*  --     Estimated Creatinine Clearance: 52.8 mL/min (by C-G formula based on SCr of 0.78 mg/dL).   Medical History: Past Medical History:  Diagnosis Date  . Anxiety 06/29/2012  . Aortic regurgitation 08/13/2015  . Arthritis    "hands, wrists, back, feet, toes" (06/24/2017)  . Atrial flutter ([place]) 09/18/2015  . Atypical chest pain 05/13/2016  . Chest pain    remote cath in 1996 with NORMAL coronaries noted  . CHF (congestive heart failure) ([place])   . Dyspnea 10/30/2010  . Essential hypertension 09/05/2013  . Exogenous obesity    history of   . GERD (gastroesophageal reflux disease)   . Glaucoma, both eyes   . Headache, migraine    "stopped in my 50's" (09/18/2015)  . History of cardiovascular stress test  2007   showing no ischemia  . Hypertension   . Mitral stenosis and aortic insufficiency 06/23/2010  . Mitral stenosis with insufficiency   . Mitral stenosis with regurgitation   . Murmur    of mitral stenosis and moderate aortic insufficiency      . Nausea 05/01/2018  . OSA on CPAP   . Palpitations    occasional  . Paroxysmal A-fib ([place]) 06/24/2017  . Pericardial effusion 09/18/2015  . Persistent atrial fibrillation ([place]) 03/10/2017  . Personal history of rheumatic heart disease   . S/P minimally-invasive maze operation for atrial fibrillation 12/13/2018   Complete bilateral atrial lesion set using cryothermy and bipolar radiofrequency ablation with clipping of LA appendage via right mini-thoracotomy approach  . S/P minimally-invasive mitral valve replacement with bioprosthetic valve 12/13/2018   31 mm [facility] Mitral stented bovine pericardial tissue valve  . SBE (subacute bacterial endocarditis)    prophylaxis, patient unaware  . Sliding hiatal hernia   . Vertigo 05/01/2018    Medications:  Medications Prior to Admission  Medication Sig Dispense Refill Last Dose  . acetaminophen (TYLENOL) 500 MG tablet Take 1,000 mg by mouth every 6 (six) hours as needed for moderate pain or headache.    Past Month at Unknown time  . ALPRAZolam (XANAX) 0.25 MG tablet Take 0.125 mg by mouth 2 (two)  times daily as needed for anxiety.    Past Week at Unknown time  . aspirin EC 81 MG EC tablet Take 1 tablet (81 mg total) by mouth daily.   01/22/2019 at Unknown time  . carvedilol (COREG) 6.25 MG tablet Take 1 tablet (6.25 mg total) by mouth 2 (two) times daily. 30 tablet 6 01/22/2019 at 1800  . cetirizine (ZYRTEC) 10 MG tablet Take 10 mg by mouth at bedtime.    01/21/2019 at Unknown time  . Cholecalciferol (VITAMIN D) 2000 UNITS CAPS Take 2,000 Units by mouth daily.     01/22/2019 at Unknown time  . conjugated estrogens (PREMARIN) vaginal cream Place 1 Applicatorful vaginally daily as needed  (irritation).    Past Month at Unknown time  . dorzolamide-timolol (COSOPT) 22.3-6.8 MG/ML ophthalmic solution Place 1 drop into both eyes daily.   01/22/2019 at Unknown time  . doxepin (SINEQUAN) 10 MG capsule Take 10-20 mg by mouth at bedtime as needed (sleep).    Past Month at Unknown time  . famotidine (PEPCID) 20 MG tablet Take 20 mg by mouth at bedtime.   01/22/2019 at Unknown time  . furosemide (LASIX) 40 MG tablet Take 1 tablet (40 mg total) by mouth daily. Take daily for 2 weeks, then take 1 tablet prn on days you notice weight gain of 3 lbs or more 30 tablet 1 01/22/2019 at Unknown time  . gabapentin (NEURONTIN) 300 MG capsule Take 300 mg by mouth 3 (three) times daily.    01/22/2019 at Unknown time  . lactobacillus acidophilus (BACID) TABS tablet Take 1 tablet by mouth daily.    01/22/2019 at Unknown time  . latanoprost (XALATAN) 0.005 % ophthalmic solution Place 1 drop into both eyes at bedtime.   6 01/21/2019 at Unknown time  . losartan (COZAAR) 25 MG tablet Take 25 mg by mouth daily.   01/22/2019 at Unknown time  . Multiple Vitamin (MULTIVITAMIN WITH MINERALS) TABS tablet Take 1 tablet by mouth daily.   01/22/2019 at Unknown time  . NON FORMULARY Apply 1 application topically 2 (two) times daily as needed (for eczema). Traimcinolone/[facility] Moist Cream   Past Month at Unknown time  . omeprazole (PRILOSEC) 40 MG capsule Take 40 mg by mouth daily.    01/22/2019 at Unknown time  . oxybutynin (DITROPAN-XL) 10 MG 24 hr tablet Take 10 mg by mouth daily.    01/22/2019 at Unknown time  . polyethylene glycol (MIRALAX / GLYCOLAX) 17 g packet Take 17 g by mouth daily as needed for moderate constipation.    Past Month at Unknown time  . potassium chloride (KLOR-CON) 10 MEQ tablet Take 1 tablet (10 mEq total) by mouth daily. 30 tablet 2 01/22/2019 at Unknown time  . warfarin (COUMADIN) 2.5 MG tablet Take 1 tablet (2.5 mg total) by mouth daily at 6 PM. Take 1 tablet by mouth daily or as directed by  coumadin clinic (Patient taking differently: Take 2.5 mg by mouth See admin instructions. Take 2 tablets on Monday then take 1 tablet all other days) 100 tablet 1 01/22/2019 at 1800  . PRESCRIPTION MEDICATION Inhale into the lungs at bedtime. CPAP       Assessment: 73 yo female with SVT on warfarin PTA for afib (also with minimally invasive bioprosthetic MVR and MAZE in 11/2018). Pharmacy consulted to dose warfarin -INR= 2.3  Home dose: 2.5mg /d except take 5mg  on Mon (last clinic visit 12/9; INR was 2.9)  Goal of Therapy:  INR 2-3 Monitor platelets by anticoagulation protocol:  Yes   Plan:   -Continue warfarin 2.5mg /day -Daily PT/INR  Hildred Laser, PharmD Clinical Pharmacist **Pharmacist phone directory can now be found on [email or web address] ([email or web address]).  Listed under [place].

## 2019-01-23 NOTE — Progress Notes (Signed)
Echocardiogram 2D Echocardiogram has been performed.  [name] 01/23/2019, 8:29 AM

## 2019-01-23 NOTE — Progress Notes (Signed)
  Paged for patient with chest pressure and continued rapid HRs.   Given 5 mg IV lopressor. Rates remained 120-130s.  Checked on patient approx 20 minutes after and chest pressure resolved. Rates remained ~120s. She thinks it may have been muscular pain as she isn't used to "lying in bed".  Of note, normal cors 09/2018.  Will potentially plan for DCCV tomorrow am if pt does not convert with flecainide. She is NPO after midnight. INR therapeutic.  Legrand Como [address] [place], PA-C  01/23/2019 5:56 PM

## 2019-01-23 NOTE — Consult Note (Addendum)
ELECTROPHYSIOLOGY CONSULT NOTE    Patient ID: Mary Farrell MRN: [id number], DOB/AGE: Oct 02, 1945 73 y.o.  Admit date: 01/22/2019 Date of Consult: 01/23/2019  Primary Physician: Lawerance Cruel, MD Primary Cardiologist: Skeet Latch, MD  Electrophysiologist: Dr. Curt Bears   Referring Provider: Dr. Marletta Lor  Patient Profile: Mary Farrell is a 73 y.o. female with a history of valvular paroxysmal atrial fibrillation and atrial flutter s/p ablation 05/2017 in the context of mitral stenosis who is s/p minimally invasive bioprosthetic MVR and MAZE 11/2018 who is being seen today for the evaluation of SVT at the request of Dr. Marletta Lor.  HPI:  Mary Farrell is a 73 y.o. female with medical history as above. She awoke am of 12/28 with palpitations and HRs in 160s. Called AF clinic who instructed to take extra dose of coreg with no effect. She continued to feel poorly so presented to [facility] and found to be tachycardic with HR at 160. Unable to break with vagal maneuvers. 6 mg of adenosine were given with brief AV nodal block with slow return of SR with 1st degree AV block.     EP asked to see for further.  As of this morning, back out of rhythm in 120-130s at rest, with some irregularity.   Pertinent labs on admission include K 3.8, Mg 1.9, Cr 0.78, INR 2.6, HS trop 21. COVID negative.  She feels like her chest is "vibrating". Denies SOB at rest, but difficult to get up and move around. Mild dizziness with exertion when HRs increase.   Past Medical History:  Diagnosis Date  . Anxiety 06/29/2012  . Aortic regurgitation 08/13/2015  . Arthritis    "hands, wrists, back, feet, toes" (06/24/2017)  . Atrial flutter ([place]) 09/18/2015  . Atypical chest pain 05/13/2016  . Chest pain    remote cath in 1996 with NORMAL coronaries noted  . CHF (congestive heart failure) ([place])   . Dyspnea 10/30/2010  . Essential hypertension 09/05/2013  . Exogenous obesity    history of   . GERD (gastroesophageal reflux disease)    . Glaucoma, both eyes   . Headache, migraine    "stopped in my 50's" (09/18/2015)  . History of cardiovascular stress test 2007   showing no ischemia  . Hypertension   . Mitral stenosis and aortic insufficiency 06/23/2010  . Mitral stenosis with insufficiency   . Mitral stenosis with regurgitation   . Murmur    of mitral stenosis and moderate aortic insufficiency      . Nausea 05/01/2018  . OSA on CPAP   . Palpitations    occasional  . Paroxysmal A-fib ([place]) 06/24/2017  . Pericardial effusion 09/18/2015  . Persistent atrial fibrillation ([place]) 03/10/2017  . Personal history of rheumatic heart disease   . S/P minimally-invasive maze operation for atrial fibrillation 12/13/2018   Complete bilateral atrial lesion set using cryothermy and bipolar radiofrequency ablation with clipping of LA appendage via right mini-thoracotomy approach  . S/P minimally-invasive mitral valve replacement with bioprosthetic valve 12/13/2018   31 mm [facility] Mitral stented bovine pericardial tissue valve  . SBE (subacute bacterial endocarditis)    prophylaxis, patient unaware  . Sliding hiatal hernia   . Vertigo 05/01/2018     Surgical History:  Past Surgical History:  Procedure Laterality Date  . ABDOMINAL HYSTERECTOMY  1975  . APPENDECTOMY  1977  . ATRIAL FIBRILLATION ABLATION N/A 03/10/2017   Procedure: ATRIAL FIBRILLATION ABLATION;  Surgeon: [name], MD;  Location: [place] CV LAB;  Service: Cardiovascular;  Laterality: N/A;  . ATRIAL FIBRILLATION ABLATION  06/24/2017  . ATRIAL FIBRILLATION ABLATION N/A 06/24/2017   Procedure: ATRIAL FIBRILLATION ABLATION;  Surgeon: [name], MD;  Location: [place] CV LAB;  Service: Cardiovascular;  Laterality: N/A;  . BUNIONECTOMY Right 2000s  . CARDIAC CATHETERIZATION  01/13/1995   normal coronary anatomy and mild mitral stenosis and mild pulmonary hypertension  . CARDIOVERSION N/A 09/22/2015   Procedure: CARDIOVERSION;  Surgeon: Jerline Pain, MD;  Location: [place];  Service: Cardiovascular;  Laterality: N/A;  . CARDIOVERSION N/A 10/25/2016   Procedure: CARDIOVERSION;  Surgeon: Fay Records, MD;  Location: [facility] ENDOSCOPY;  Service: Cardiovascular;  Laterality: N/A;  . CARDIOVERSION N/A 04/15/2017   Procedure: CARDIOVERSION;  Surgeon: Josue Hector, MD;  Location: [facility] ENDOSCOPY;  Service: Cardiovascular;  Laterality: N/A;  . CARDIOVERSION N/A 05/16/2017   Procedure: CARDIOVERSION;  Surgeon: Pixie Casino, MD;  Location: [facility] ENDOSCOPY;  Service: Cardiovascular;  Laterality: N/A;  . CATARACT EXTRACTION W/ INTRAOCULAR LENS  IMPLANT, BILATERAL Bilateral 2013  . COLONOSCOPY  2013  . EYE SURGERY Bilateral    cataracts  . LAPAROSCOPIC CHOLECYSTECTOMY  1997  . MINIMALLY INVASIVE MAZE PROCEDURE N/A 12/13/2018   Procedure: MINIMALLY INVASIVE MAZE PROCEDURE using a 45 MM AtriClip.;  Surgeon: Rexene Alberts, MD;  Location: [place];  Service: Open Heart Surgery;  Laterality: N/A;  . MITRAL VALVE REPLACEMENT Right 12/13/2018   Procedure: MINIMALLY INVASIVE MITRAL VALVE (MV) REPLACEMENT using a Magna Mitral Ease 31 MM Valve.;  Surgeon: Rexene Alberts, MD;  Location: [place];  Service: Open Heart Surgery;  Laterality: Right;  . RIGHT/LEFT HEART CATH AND CORONARY ANGIOGRAPHY N/A 10/18/2018   Procedure: RIGHT/LEFT HEART CATH AND CORONARY ANGIOGRAPHY;  Surgeon: Leonie Man, MD;  Location: [place] CV LAB;  Service: Cardiovascular;  Laterality: N/A;  . TEE WITHOUT CARDIOVERSION N/A 06/23/2017   Procedure: TRANSESOPHAGEAL ECHOCARDIOGRAM (TEE);  Surgeon: Fay Records, MD;  Location: [place];  Service: Cardiovascular;  Laterality: N/A;  . TEE WITHOUT CARDIOVERSION N/A 10/17/2018   Procedure: TRANSESOPHAGEAL ECHOCARDIOGRAM (TEE);  Surgeon: Jerline Pain, MD;  Location: [facility] ENDOSCOPY;  Service: Cardiovascular;  Laterality: N/A;  . TEE WITHOUT CARDIOVERSION N/A 12/13/2018   Procedure: TRANSESOPHAGEAL ECHOCARDIOGRAM (TEE);  Surgeon:  Rexene Alberts, MD;  Location: [place];  Service: Open Heart Surgery;  Laterality: N/A;  . TONSILLECTOMY AND ADENOIDECTOMY  1990s  . UVULOPALATOPHARYNGOPLASTY, TONSILLECTOMY AND SEPTOPLASTY  1990s     Medications Prior to Admission  Medication Sig Dispense Refill Last Dose  . acetaminophen (TYLENOL) 500 MG tablet Take 1,000 mg by mouth every 6 (six) hours as needed for moderate pain or headache.    Past Month at Unknown time  . ALPRAZolam (XANAX) 0.25 MG tablet Take 0.125 mg by mouth 2 (two) times daily as needed for anxiety.    Past Week at Unknown time  . aspirin EC 81 MG EC tablet Take 1 tablet (81 mg total) by mouth daily.   01/22/2019 at Unknown time  . carvedilol (COREG) 6.25 MG tablet Take 1 tablet (6.25 mg total) by mouth 2 (two) times daily. 30 tablet 6 01/22/2019 at 1800  . cetirizine (ZYRTEC) 10 MG tablet Take 10 mg by mouth at bedtime.    01/21/2019 at Unknown time  . Cholecalciferol (VITAMIN D) 2000 UNITS CAPS Take 2,000 Units by mouth daily.     01/22/2019 at Unknown time  . conjugated estrogens (PREMARIN) vaginal cream Place 1 Applicatorful vaginally daily as  needed (irritation).    Past Month at Unknown time  . dorzolamide-timolol (COSOPT) 22.3-6.8 MG/ML ophthalmic solution Place 1 drop into both eyes daily.   01/22/2019 at Unknown time  . doxepin (SINEQUAN) 10 MG capsule Take 10-20 mg by mouth at bedtime as needed (sleep).    Past Month at Unknown time  . famotidine (PEPCID) 20 MG tablet Take 20 mg by mouth at bedtime.   01/22/2019 at Unknown time  . furosemide (LASIX) 40 MG tablet Take 1 tablet (40 mg total) by mouth daily. Take daily for 2 weeks, then take 1 tablet prn on days you notice weight gain of 3 lbs or more 30 tablet 1 01/22/2019 at Unknown time  . gabapentin (NEURONTIN) 300 MG capsule Take 300 mg by mouth 3 (three) times daily.    01/22/2019 at Unknown time  . lactobacillus acidophilus (BACID) TABS tablet Take 1 tablet by mouth daily.    01/22/2019 at Unknown time  .  latanoprost (XALATAN) 0.005 % ophthalmic solution Place 1 drop into both eyes at bedtime.   6 01/21/2019 at Unknown time  . losartan (COZAAR) 25 MG tablet Take 25 mg by mouth daily.   01/22/2019 at Unknown time  . Multiple Vitamin (MULTIVITAMIN WITH MINERALS) TABS tablet Take 1 tablet by mouth daily.   01/22/2019 at Unknown time  . NON FORMULARY Apply 1 application topically 2 (two) times daily as needed (for eczema). Traimcinolone/[employer] Moist Cream   Past Month at Unknown time  . omeprazole (PRILOSEC) 40 MG capsule Take 40 mg by mouth daily.    01/22/2019 at Unknown time  . oxybutynin (DITROPAN-XL) 10 MG 24 hr tablet Take 10 mg by mouth daily.    01/22/2019 at Unknown time  . polyethylene glycol (MIRALAX / GLYCOLAX) 17 g packet Take 17 g by mouth daily as needed for moderate constipation.    Past Month at Unknown time  . potassium chloride (KLOR-CON) 10 MEQ tablet Take 1 tablet (10 mEq total) by mouth daily. 30 tablet 2 01/22/2019 at Unknown time  . warfarin (COUMADIN) 2.5 MG tablet Take 1 tablet (2.5 mg total) by mouth daily at 6 PM. Take 1 tablet by mouth daily or as directed by coumadin clinic (Patient taking differently: Take 2.5 mg by mouth See admin instructions. Take 2 tablets on Monday then take 1 tablet all other days) 100 tablet 1 01/22/2019 at 1800  . PRESCRIPTION MEDICATION Inhale into the lungs at bedtime. CPAP       Inpatient Medications:  . aspirin EC  81 mg Oral Daily  . carvedilol  12.5 mg Oral BID WC  . famotidine  20 mg Oral QHS  . oxybutynin  10 mg Oral Daily  . sodium chloride flush  3 mL Intravenous Once  . warfarin  2.5 mg Oral q1800  . Warfarin - Physician Dosing Inpatient   Does not apply q1800    Allergies:  Allergies  Allergen Reactions  . Amoxicillin Diarrhea    Has patient had a PCN reaction causing immediate rash, facial/tongue/throat swelling, SOB or lightheadedness with hypotension: no Has patient had a PCN reaction causing severe rash involving mucus  membranes or skin necrosis: no Has patient had a PCN reaction that required hospitalization: no Has patient had a PCN reaction occurring within the last 10 years: no If all of the above answers are "NO", then may proceed with Cephalosporin use.   . Metoprolol Tartrate Other (See Comments)    Mouth sores "mouth tastes like mold"  . Oxaprozin Nausea  Only    Social History   Socioeconomic History  . Marital status: Married    Spouse name: Not on file  . Number of children: Not on file  . Years of education: Not on file  . Highest education level: Not on file  Occupational History  . Occupation: Retired  Tobacco Use  . Smoking status: Never Smoker  . Smokeless tobacco: Never Used  Substance and Sexual Activity  . Alcohol use: Yes    Comment: rarely  . Drug use: No  . Sexual activity: Not Currently  Other Topics Concern  . Not on file  Social History Narrative   Lives with husband in [place].   Social Determinants of Health   Financial Resource Strain:   . Difficulty of Paying Living Expenses: Not on file  Food Insecurity:   . Worried About Charity fundraiser in the Last Year: Not on file  . Ran Out of Food in the Last Year: Not on file  Transportation Needs:   . Lack of Transportation (Medical): Not on file  . Lack of Transportation (Non-Medical): Not on file  Physical Activity:   . Days of Exercise per Week: Not on file  . Minutes of Exercise per Session: Not on file  Stress:   . Feeling of Stress : Not on file  Social Connections:   . Frequency of Communication with Friends and Family: Not on file  . Frequency of Social Gatherings with Friends and Family: Not on file  . Attends Religious Services: Not on file  . Active Member of Clubs or Organizations: Not on file  . Attends Archivist Meetings: Not on file  . Marital Status: Not on file  Intimate Partner Violence:   . Fear of Current or Ex-Partner: Not on file  . Emotionally Abused: Not on file   . Physically Abused: Not on file  . Sexually Abused: Not on file     Family History  Problem Relation Age of Onset  . Heart attack Mother   . Hypertension Brother   . Kidney disease Brother   . Diabetes Brother   . Heart failure Maternal Grandfather      Review of Systems: All other systems reviewed and are otherwise negative except as noted above.  Physical Exam: Vitals:   01/23/19 0400 01/23/19 0600 01/23/19 0739 01/23/19 0800  BP: 122/67 (!) 128/57 136/81 119/60  Pulse:   (!) 115 (!) 103  Resp: 13 20 16 19   Temp:      TempSrc:      SpO2:      Weight:      Height:        GEN- The patient is fatigued appearing, alert and oriented x 3 today.   HEENT: normocephalic, atraumatic; sclera clear, conjunctiva pink; hearing intact; oropharynx clear; neck supple Lungs- Clear to ausculation bilaterally, normal work of breathing.  No wheezes, rales, rhonchi Heart- Irregular and rapid rate and rhythm, no murmurs, rubs or gallops GI- soft, non-tender, non-distended, bowel sounds present Extremities- no clubbing, cyanosis, or edema; DP/PT/radial pulses 2+ bilaterally MS- no significant deformity or atrophy Skin- warm and dry, no rash or lesion Psych- euthymic mood, full affect Neuro- strength and sensation are intact  Labs:   Lab Results  Component Value Date   WBC 8.2 01/22/2019   HGB 12.0 01/22/2019   HCT 39.4 01/22/2019   MCV 90.8 01/22/2019   PLT 355 01/22/2019    Recent Labs  Lab 01/22/19 2131  NA 140  K 3.8  CL 101  CO2 29  BUN 12  CREATININE 0.78  CALCIUM 9.2  GLUCOSE 132*      Radiology/Studies: DG Chest 2 View  Result Date: 01/08/2019 CLINICAL DATA:  S/p surgery for atrial fibrillation. EXAM: CHEST - 2 VIEW COMPARISON:  01/01/2019 FINDINGS: Mitral valve replacement and atrial clip appear the same. Left chest is clear. Right chest again shows some pleural fluid with dependent atelectasis. No worsening or new finding. IMPRESSION: No significant change  since 1 week ago. Persistent right effusion and atelectasis. Electronically Signed   By: Nelson Chimes M.D.   On: 01/08/2019 13:50   DG Chest 2 View  Result Date: 01/01/2019 CLINICAL DATA:  s/p minimally invasive maze operation for afib. EXAM: CHEST - 2 VIEW COMPARISON:  Chest x-ray 12/18/2018. FINDINGS: Cardiac valve replacement. Left atrial appendage clip in stable position. Stable cardiomegaly. Bilateral pulmonary venous congestion and mild interstitial prominence. Small right pleural effusion. Findings consistent CHF. Similar findings noted on prior exam. No evidence of significant pneumothorax noted on today's exam. IMPRESSION: Cardiac valve replacement. Left atrial appendage clip noted stable position. Stable cardiomegaly. Bilateral pulmonary venous congestion and mild interstitial prominence. Small right pleural effusion. Findings consistent CHF. Similar findings noted on prior exam. 2.  No evidence of significant pneumothorax noted on today's exam. Electronically Signed   By: Enhaut   On: 01/01/2019 14:01   CT HEAD WO CONTRAST  Result Date: 01/01/2019 CLINICAL DATA:  Head trauma with left forehead bruising. EXAM: CT HEAD WITHOUT CONTRAST TECHNIQUE: Contiguous axial images were obtained from the base of the skull through the vertex without intravenous contrast. COMPARISON:  10/15/2018 FINDINGS: Brain: There is no evidence of acute infarct, intracranial hemorrhage, mass, midline shift, or extra-axial fluid collection. The ventricles and sulci are within normal limits for age. Vascular: Calcified atherosclerosis at the skull base. No hyperdense vessel. Skull: No fracture or focal osseous lesion. Sinuses/Orbits: Paranasal sinuses and mastoid air cells are clear. Bilateral cataract extraction. Other: None. IMPRESSION: No evidence of acute intracranial abnormality. Electronically Signed   By: Logan Bores M.D.   On: 01/01/2019 16:38   DG Chest Port 1 View  Result Date: 01/22/2019 CLINICAL  DATA:  Tachycardia, chest tightness, history of AFib EXAM: PORTABLE CHEST 1 VIEW COMPARISON:  01/08/2019 FINDINGS: Cardiomegaly with mitral valve prosthesis and left atrial appendage clip. Minimal linear scarring or atelectasis of the left midlung. The visualized skeletal structures are unremarkable. IMPRESSION: 1.  No acute abnormality of the lungs in AP portable projection. 2. Cardiomegaly with mitral valve prosthesis and left atrial appendage clip. Electronically Signed   By: Eddie Candle M.D.   On: 01/22/2019 21:59   DG Humerus Right  Result Date: 01/01/2019 CLINICAL DATA:  Door fell on patient. Right arm pain. EXAM: RIGHT HUMERUS - 2+ VIEW COMPARISON:  None. FINDINGS: Acute bone or soft tissue abnormalities are present. No radiopaque foreign body is present. Mild degenerative changes are noted at the [facility] joint. IMPRESSION: 1. No acute or focal abnormality. 2. No radiopaque foreign body. Electronically Signed   By: San Morelle M.D.   On: 01/01/2019 15:23    EKG: Appears atrial flutter with rates in 166 bpm, by tele, much more irregular (personally reviewed)  TELEMETRY: Initially NSR s/p adenosine, but now likely Atrial fib with baseline rates in 120-130s, up to 140-160s with activity (personally reviewed)  Assessment/Plan: 1.  Persistent atrial fibrillation/flutter Complicated history s/p ablation 02/2017 ([facility] approach and LA roof line) and 05/2017 (circling of all 4  veins + a cavo-triscupid isthmus line) Most recently had mini-MVR (bioprosthetic) with MAZE 12/13/18 Flecainide stopped 01/11/2019 in the setting of ? Junctional rhythm.  Episodes of ? Flutter, but very irregular currently.  Continue coreg 12.5 mg BID Discussed with Dr. Rayann Heman, for now will resume flecainide at previous dose of 100 mg BID. (Normal EF, normal cors 09/2018)  2. HTN Continue to adjust meds as tolerated  3. Mitral Stenosis s/p minimally invasive MVR with bioprosthetic valve and Maze procedure Follow up echo  pending.    For questions or updates, please contact [place] Please consult [email or web address] for contact info under Cardiology/STEMI.  Signed, Shirley Friar, PA-C  01/23/2019 9:59 AM   I have seen, examined the patient, and reviewed the above assessment and plan.  Changes to above are made where necessary.  On exam, iRRR.  The patient reports doing better when on flecainide.  I think it may be best to resume flecainide 100mg  BID at this time.  She may benefit from cardioversion tomorrow if she does not convert to sinus rhythm overnight.  Co Sign: Thompson Grayer, MD 01/23/2019 10:19 PM

## 2019-01-23 NOTE — Progress Notes (Signed)
[address]       [place] [address]             [phone]     CARDIOTHORACIC SURGERY PROGRESS NOTE  Subjective: Patient currently feels well.  No SOB.  Somewhat anxious because of recent passing of her brother.  Objective: Vital signs in last 24 hours: Temp:  [97.6 F (36.4 C)-98.3 F (36.8 C)] 97.6 F (36.4 C) (12/29 0200) Pulse Rate:  [58-164] 103 (12/29 0800) Cardiac Rhythm: Atrial fibrillation (12/29 0800) Resp:  [13-40] 19 (12/29 0800) BP: (100-136)/(53-113) 119/60 (12/29 0800) SpO2:  [95 %-100 %] 100 % (12/29 0200) Weight:  [69.2 kg] 69.2 kg (12/29 0200)  Physical Exam:  Rhythm:   Afib  Breath sounds: clear  Heart sounds:  irregular  Incisions:  Healing nicely  Abdomen:  Soft, non-distended, non-tender  Extremities:  Warm, well-perfused    Intake/Output from previous day: No intake/output data recorded. Intake/Output this shift: Total I/O In: 0  Out: 300 [Urine:300]  Lab Results: Recent Labs    01/22/19 2131  WBC 8.2  HGB 12.0  HCT 39.4  PLT 355   BMET:  Recent Labs    01/22/19 2131  NA 140  K 3.8  CL 101  CO2 29  GLUCOSE 132*  BUN 12  CREATININE 0.78  CALCIUM 9.2    CBG (last 3)  No results for input(s): GLUCAP in the last 72 hours. PT/INR:   Recent Labs    01/23/19 0801  LABPROT 27.7*  INR 2.6*    CXR:  PORTABLE CHEST 1 VIEW  COMPARISON:  01/08/2019  FINDINGS: Cardiomegaly with mitral valve prosthesis and left atrial appendage clip. Minimal linear scarring or atelectasis of the left midlung. The visualized skeletal structures are unremarkable.  IMPRESSION: 1.  No acute abnormality of the lungs in AP portable projection.  2. Cardiomegaly with mitral valve prosthesis and left atrial appendage clip.   Electronically Signed   By: Eddie Candle M.D.   On: 01/22/2019 21:59   Assessment/Plan:  Patient is well-known to me having recently undergone minimally-invasive mitral valve  replacement and maze procedure on 12/13/2018 for rheumatic heart disease with severe mitral stenosis and recurrent persistent Afib.  Her early postop recovery in the hospital was uneventful although notable for recurrence of paroxysmal Afib for which she was restarted on flecainide, which she had been taking preoperatively.  She has history of poor tolerance of amiodarone in the distant past secondary to nausea.  She was ultimately discharged home in NSR w/ 1st degree AV block on the 7th postop day.  She suffered a fall at home and was last seen in our office on 01/08/2019 at which time she was doing fairly well, although at that time she appeared slightly volume-overloaded with mild DOE, increased weight, and a small right pleural effusion.  Lasix was resumed at that time.  She was seen by Dr Curt Bears on 01/11/2019 at which time EKG suggested junctional rhythm w/ HR 94 - flecainide was stopped.  Patient states that since then she had been doing well and her breathing improved until her brother passed away last week.  His funeral was yesterday.  Last night she was readmitted via ED with recurrent SVT and Afib.  At present she looks and feels well, although she remains in Afib w/ HR 120's.  She remains therapeutic on warfarin.  We will defer management of Afib/SVT to EP service.  Patient appears to be otherwise doing well and  has no specific physical limitations related to her recent heart surgery.  We will plan to see her again for routine follow up in 2 months.   Rexene Alberts, MD 01/23/2019 10:43 AM

## 2019-01-23 NOTE — ED Notes (Signed)
Report given to 2C RN. All questions answered. 

## 2019-01-23 NOTE — Progress Notes (Signed)
Paged MD about patient HR in 160's when ambulating to the bathroom. Coreg given. Pt resting in bed. HR in 110's. Page returned by MD, RN advise to continue to monitor HR. As long as HR returns to baseline after resting continue to monitor. Rounding MD will see patient during rounds. RN will continue to monitor.

## 2019-01-23 NOTE — Plan of Care (Signed)

## 2019-01-23 NOTE — Anesthesia Preprocedure Evaluation (Addendum)
Anesthesia Evaluation  Patient identified by MRN, date of birth, ID band Patient awake    Reviewed: Allergy & Precautions, NPO status , Patient's Chart, lab work & pertinent test results  Airway Mallampati: III  TM Distance: >3 FB Neck ROM: Full    Dental  (+) Poor Dentition, Missing,    Pulmonary sleep apnea ,    Pulmonary exam normal        Cardiovascular hypertension, Pt. on home beta blockers +CHF   Rhythm:Irregular Rate:Tachycardia     Neuro/Psych  Headaches, Anxiety    GI/Hepatic Neg liver ROS, hiatal hernia, GERD  Medicated,  Endo/Other  negative endocrine ROS  Renal/GU negative Renal ROS     Musculoskeletal  (+) Arthritis ,   Abdominal Normal abdominal exam  (+)   Peds  Hematology negative hematology ROS (+)   Anesthesia Other Findings   Reproductive/Obstetrics                            Lab Results  Component Value Date   INR 2.6 (H) 01/23/2019   INR 2.3 (H) 01/22/2019   INR 2.6 01/17/2019   Echo:  1. Left ventricular ejection fraction, by visual estimation, is 55 to 60%. The left ventricle has normal function. Left ventricular septal wall thickness was mildly increased. Moderately increased left ventricular posterior wall thickness. There is  mildly increased left ventricular hypertrophy.  2. Left ventricular diastolic parameters are indeterminate.  3. The left ventricle demonstrates regional wall motion abnormalities.  4. Elevated LVEDP. Hypokinesis of the basal septum consistent with post-operative state.  5. Global right ventricle has normal systolic function.The right ventricular size is normal. No increase in right ventricular wall thickness.  6. Left atrial size was severely dilated.  7. Right atrial size was normal.  8. The mitral valve has been repaired/replaced. No evidence of mitral valve regurgitation. No evidence of mitral stenosis.  9. The tricuspid valve is  normal in structure. 10. The aortic valve is tricuspid. Aortic valve regurgitation is mild. No evidence of aortic valve sclerosis or stenosis. 11. The pulmonic valve was normal in structure. Pulmonic valve regurgitation is not visualized. 12. Normal pulmonary artery systolic pressure. 13. The inferior vena cava is normal in size with <50% respiratory variability, suggesting right atrial pressure of 8 mmHg.   Anesthesia Physical Anesthesia Plan  ASA: III  Anesthesia Plan: General   Post-op Pain Management:    Induction: Intravenous  PONV Risk Score and Plan: 0 and Treatment may vary due to age or medical condition  Airway Management Planned: Natural Airway and Simple Face Mask  Additional Equipment: None  Intra-op Plan:   Post-operative Plan:   Informed Consent: I have reviewed the patients History and Physical, chart, labs and discussed the procedure including the risks, benefits and alternatives for the proposed anesthesia with the patient or authorized representative who has indicated his/her understanding and acceptance.       Plan Discussed with: CRNA  Anesthesia Plan Comments:        Anesthesia Quick Evaluation

## 2019-01-24 ENCOUNTER — Other Ambulatory Visit: Payer: Self-pay

## 2019-01-24 ENCOUNTER — Encounter (HOSPITAL_COMMUNITY): Payer: Self-pay | Admitting: Internal Medicine

## 2019-01-24 ENCOUNTER — Inpatient Hospital Stay (HOSPITAL_COMMUNITY): Payer: Medicare Other | Admitting: Anesthesiology

## 2019-01-24 ENCOUNTER — Encounter (HOSPITAL_COMMUNITY)
Admission: EM | Disposition: A | Discharge status: Home / Self Care | Payer: Self-pay | Source: Home / Self Care | Attending: Internal Medicine

## 2019-01-24 DIAGNOSIS — I484 Atypical atrial flutter: Secondary | ICD-10-CM

## 2019-01-24 HISTORY — PX: CARDIOVERSION: SHX1299

## 2019-01-24 LAB — MAGNESIUM: Magnesium: 1.9 mg/dL (ref 1.7–2.4)

## 2019-01-24 LAB — BASIC METABOLIC PANEL
Anion gap: 13 (ref 5–15)
BUN: 15 mg/dL (ref 8–23)
CO2: 22 mmol/L (ref 22–32)
Calcium: 8.8 mg/dL — ABNORMAL LOW (ref 8.9–10.3)
Chloride: 106 mmol/L (ref 98–111)
Creatinine, Ser: 0.72 mg/dL (ref 0.44–1.00)
GFR calc Af Amer: >60 mL/min (ref >60)
GFR calc non Af Amer: >60 mL/min (ref >60)
Glucose, Bld: 128 mg/dL — ABNORMAL HIGH (ref 70–99)
Potassium: 3.9 mmol/L (ref 3.5–5.1)
Sodium: 141 mmol/L (ref 135–145)

## 2019-01-24 LAB — PROTIME-INR
INR: 2.7 — ABNORMAL HIGH (ref 0.8–1.2)
Prothrombin Time: 28.3 seconds — ABNORMAL HIGH (ref 11.4–15.2)

## 2019-01-24 SURGERY — CARDIOVERSION
Anesthesia: General

## 2019-01-24 MED ORDER — PROPOFOL 10 MG/ML IV BOLUS
INTRAVENOUS | Status: DC | PRN
  Administered 2019-01-24: 55 mg via INTRAVENOUS

## 2019-01-24 MED ORDER — DOFETILIDE 500 MCG PO CAPS
500.0000 ug | ORAL_CAPSULE | Freq: Two times a day (BID) | ORAL | Status: DC
  Administered 2019-01-25 – 2019-01-28 (×6): 500 ug via ORAL
  Filled 2019-01-24 (×7): qty 1

## 2019-01-24 MED ORDER — POTASSIUM CHLORIDE CRYS ER 20 MEQ PO TBCR
20.0000 meq | EXTENDED_RELEASE_TABLET | Freq: Once | ORAL | Status: AC
  Administered 2019-01-24: 16:00:00 20 meq via ORAL
  Filled 2019-01-24: qty 1

## 2019-01-24 MED ORDER — SODIUM CHLORIDE 0.9% FLUSH
3.0000 mL | Freq: Two times a day (BID) | INTRAVENOUS | Status: DC
  Administered 2019-01-24 – 2019-01-27 (×7): 3 mL via INTRAVENOUS

## 2019-01-24 MED ORDER — SODIUM CHLORIDE 0.9 % IV SOLN
250.0000 mL | INTRAVENOUS | Status: DC | PRN
  Administered 2019-01-24 – 2019-01-26 (×2): 250 mL via INTRAVENOUS

## 2019-01-24 MED ORDER — LIDOCAINE 2% (20 MG/ML) 5 ML SYRINGE
INTRAMUSCULAR | Status: DC | PRN
  Administered 2019-01-24: 40 mg via INTRAVENOUS

## 2019-01-24 MED ORDER — MAGNESIUM SULFATE 2 GM/50ML IV SOLN
2.0000 g | Freq: Once | INTRAVENOUS | Status: AC
  Administered 2019-01-24: 2 g via INTRAVENOUS
  Filled 2019-01-24: qty 50

## 2019-01-24 MED ORDER — SODIUM CHLORIDE 0.9 % IV SOLN: INTRAVENOUS | Status: DC | PRN

## 2019-01-24 MED ORDER — SODIUM CHLORIDE 0.9% FLUSH: 3.0000 mL | INTRAVENOUS | Status: DC | PRN

## 2019-01-24 NOTE — Anesthesia Postprocedure Evaluation (Signed)
Anesthesia Post Note  Patient: Mary Farrell  Procedure(s) Performed: CARDIOVERSION (N/A )     Patient location during evaluation: PACU Anesthesia Type: General Level of consciousness: awake and alert Pain management: pain level controlled Vital Signs Assessment: post-procedure vital signs reviewed and stable Respiratory status: spontaneous breathing, nonlabored ventilation, respiratory function stable and patient connected to nasal cannula oxygen Cardiovascular status: blood pressure returned to baseline and stable Postop Assessment: no apparent nausea or vomiting Anesthetic complications: no    Last Vitals:  Vitals:   01/24/19 0850 01/24/19 0916  BP: (!) 102/48 (!) 109/49  Pulse: 80 81  Resp: 16 (!) 21  Temp:  (!) 36.4 C  SpO2: 96% 96%    Last Pain:  Vitals:   01/24/19 0916  TempSrc: Axillary  PainSc:                  [name]

## 2019-01-24 NOTE — Transfer of Care (Signed)
Immediate Anesthesia Transfer of Care Note  Patient: Mary Farrell  Procedure(s) Performed: CARDIOVERSION (N/A )  Patient Location: Endoscopy Unit  Anesthesia Type:General  Level of Consciousness: awake, alert , patient cooperative and responds to stimulation  Airway & Oxygen Therapy: Patient Spontanous Breathing and Patient connected to nasal cannula oxygen  Post-op Assessment: Report given to RN and Post -op Vital signs reviewed and stable  Post vital signs: Reviewed and stable  Last Vitals:  Vitals Value Taken Time  BP    Temp    Pulse    Resp    SpO2      Last Pain:  Vitals:   01/24/19 [id number]  TempSrc: Oral  PainSc: 0-No pain         Complications: No apparent anesthesia complications

## 2019-01-24 NOTE — Progress Notes (Addendum)
Pharmacy: Dofetilide (Tikosyn) - Initial Consult Assessment and Electrolyte Replacement  Pharmacy consulted to assist in monitoring and replacing electrolytes in this 73 y.o. female admitted on 01/22/2019 undergoing dofetilide initiation. First dofetilide dose:01/24/20 at 8pm  Assessment:  Patient Exclusion Criteria: If any screening criteria checked as "Yes", then  patient  should NOT receive dofetilide until criteria item is corrected.  If "Yes" please indicate correction plan.  YES  NO Patient  Exclusion Criteria Correction Plan   [x]   []   Baseline QTc interval is greater than or equal to 440 msec. IF above YES box checked dofetilide contraindicated unless patient has ICD; then may proceed if QTc 500-550 msec or with known ventricular conduction abnormalities may proceed with QTc 550-600 msec. QTc = 461 Continue per EP   []   [x]   Patient is known or suspected to have a digoxin level greater than 2 ng/ml: No results found for: DIGOXIN     []   [x]   Creatinine clearance less than 20 ml/min (calculated using Cockcroft-Gault, actual body weight and serum creatinine):     [x]   []  Patient has received drugs known to prolong the QT intervals within the last 48 hours (phenothiazines, tricyclics or tetracyclic antidepressants, erythromycin, H-1 antihistamines, cisapride, fluoroquinolones, azithromycin). Updated information on QT prolonging agents is available to be searched on the following database:QT prolonging agents  Flecainide given at ~ 10am on 12/30. Tikosyn to be delayed until 1231 at 8pm   []   [x]   Patient received a dose of hydrochlorothiazide (Oretic) alone or in any combination including triamterene (Dyazide, Maxzide) in the last 48 hours.    []   [x]  Patient received a medication known to increase dofetilide plasma concentrations prior to initial dofetilide dose:  . Trimethoprim (Primsol, Proloprim) in the last 36 hours . Verapamil (Calan, Verelan) in the last 36 hours or  a sustained release dose in the last 72 hours . Megestrol (Megace) in the last 5 days  . Cimetidine (Tagamet) in the last 6 hours . Ketoconazole (Nizoral) in the last 24 hours . Itraconazole (Sporanox) in the last 48 hours  . Prochlorperazine (Compazine) in the last 36 hours     []   [x]   Patient is known to have a history of torsades de pointes; congenital or acquired long QT syndromes.    []   [x]   Patient has received a Class 1 antiarrhythmic with less than 2 half-lives since last dose. (Disopyramide, Quinidine, Procainamide, Lidocaine, Mexiletine, Flecainide, Propafenone) Flecainide half life ranges 12-27 hours. First dose of Tikosyn is scheduled at 8pm on 12/31 (~ 34 hours after the last flecainide dose). With CrCl ~ 65 anticipate half life will be closer to 12 hours. The washout should be adequate   []   [x]   Patient has received amiodarone therapy in the past 3 months or amiodarone level is greater than 0.3 ng/ml.    Patient has been appropriately anticoagulated with warfarin.    Plan:   Potassium: -will give Kdur 18meq x1  Magnesium: Mg 1.8-2: Give Mg 2 gm IV x1    Thank you for allowing pharmacy to participate in this patient's care   Hildred Laser, PharmD Clinical Pharmacist **Pharmacist phone directory can now be found on [email or web address] ([email or web address]).  Listed under [place].

## 2019-01-24 NOTE — Progress Notes (Signed)
Cardioversion done this morning pt lying in best resting. RN call to beside. Pt HR now 125-130. RN paged MD to advise. Awaiting orders. RN will continue to monitor.

## 2019-01-24 NOTE — H&P (Signed)
   INTERVAL PROCEDURE H&P  History and Physical Interval Note:  01/24/2019 8:12 AM  Mary Farrell has presented today for their planned procedure. The various methods of treatment have been discussed with the patient and family. After consideration of risks, benefits and other options for treatment, the patient has consented to the procedure.  The patients' outpatient history has been reviewed, patient examined, and no change in status from most recent office note within the past 30 days. I have reviewed the patients' chart and labs and will proceed as planned. Questions were answered to the patient's satisfaction.   Mary Casino, MD, [facility], [place] Director of the Advanced Lipid Disorders &  Cardiovascular Risk Reduction Clinic Diplomate of the American Board of Clinical Lipidology Attending Cardiologist  Direct Dial: [phone]  Fax: [phone]  Website:  [email or web address]  Mary Farrell Mary Farrell 01/24/2019, 8:12 AM

## 2019-01-24 NOTE — CV Procedure (Addendum)
   CARDIOVERSION NOTE  Procedure: Electrical Cardioversion Indications:  Atrial Tachycardia  Procedure Details:  Consent: Risks of procedure as well as the alternatives and risks of each were explained to the (patient/caregiver).  Consent for procedure obtained.  Time Out: Verified patient identification, verified procedure, site/side was marked, verified correct patient position, special equipment/implants available, medications/allergies/relevent history reviewed, required imaging and test results available.  Performed  Patient placed on cardiac monitor, pulse oximetry, supplemental oxygen as necessary.  Sedation given: propofol per anesthesia Pacer pads placed anterior and posterior chest.  Cardioverted 1 time(s).  Cardioverted at 150Jbiphasic.  Impression: Findings: Post procedure EKG shows: NSR Complications: None Patient did tolerate procedure well.  Plan: 1. Successful DCCV with a single 150J biphasic shock.  Time Spent Directly with the Patient:  30 minutes   Pixie Casino, MD, [facility], [place] Director of the Advanced Lipid Disorders &  Cardiovascular Risk Reduction Clinic Diplomate of the American Board of Clinical Lipidology Attending Cardiologist  Direct Dial: [phone]  Fax: [phone]  Website:  [email or web address] 01/24/2019, 8:19 AM

## 2019-01-24 NOTE — Progress Notes (Addendum)
Electrophysiology Rounding Note  Patient Name: Mary Farrell Date of Encounter: 01/24/2019  Primary Cardiologist: Oval Linsey Electrophysiologist: Curt Bears   Subjective   The patient is doing ok today. She underwent cardioversion this morning but unfortunately has reverted back to flutter.  Inpatient Medications    Scheduled Meds: . aspirin EC  81 mg Oral Daily  . carvedilol  12.5 mg Oral BID WC  . famotidine  20 mg Oral QHS  . flecainide  100 mg Oral Q12H  . oxybutynin  10 mg Oral Daily  . sodium chloride flush  3 mL Intravenous Once  . warfarin  2.5 mg Oral q1800  . Warfarin - Pharmacist Dosing Inpatient   Does not apply q1800   Continuous Infusions:  PRN Meds: acetaminophen, acetaminophen, ALPRAZolam, doxepin, ondansetron (ZOFRAN) IV, zolpidem   Vital Signs    Vitals:   01/24/19 0840 01/24/19 0850 01/24/19 0916 01/24/19 1053  BP: (!) 96/37 (!) 102/48 (!) 109/49   Pulse: 79 80 81   Resp: 17 16 (!) 21   Temp:   (!) 97.5 F (36.4 C) 97.8 F (36.6 C)  TempSrc:   Axillary Oral  SpO2: 96% 96% 96%   Weight:      Height:        Intake/Output Summary (Last 24 hours) at 01/24/2019 1155 Last data filed at 01/24/2019 1053 Gross per 24 hour  Intake 580 ml  Output 400 ml  Net 180 ml   Filed Weights   01/23/19 0200  Weight: 69.2 kg    Physical Exam    GEN- The patient is well appearing, alert and oriented x 3 today.   Head- normocephalic, atraumatic Eyes-  Sclera clear, conjunctiva pink Ears- hearing intact Oropharynx- clear Neck- supple Lungs- Clear to ausculation bilaterally, normal work of breathing Heart- Tachycardic regular rate and rhythm  GI- soft, NT, ND, + BS Extremities- no clubbing, cyanosis, or edema Skin- no rash or lesion Psych- euthymic mood, full affect Neuro- strength and sensation are intact  Labs    CBC Recent Labs    01/22/19 2131  WBC 8.2  HGB 12.0  HCT 39.4  MCV 90.8  PLT [id number]   Basic Metabolic Panel Recent Labs   01/22/19 2131 01/22/19 2329  NA 140  --   K 3.8  --   CL 101  --   CO2 29  --   GLUCOSE 132*  --   BUN 12  --   CREATININE 0.78  --   CALCIUM 9.2  --   MG  --  1.9     Telemetry    SR, 1st degree AV block post cardioversion -> flutter rates 120's (personally reviewed)  Radiology    DG Chest Port 1 View  Result Date: 01/22/2019 CLINICAL DATA:  Tachycardia, chest tightness, history of AFib EXAM: PORTABLE CHEST 1 VIEW COMPARISON:  01/08/2019 FINDINGS: Cardiomegaly with mitral valve prosthesis and left atrial appendage clip. Minimal linear scarring or atelectasis of the left midlung. The visualized skeletal structures are unremarkable. IMPRESSION: 1.  No acute abnormality of the lungs in AP portable projection. 2. Cardiomegaly with mitral valve prosthesis and left atrial appendage clip. Electronically Signed   By: Eddie Candle M.D.   On: 01/22/2019 21:59   ECHOCARDIOGRAM COMPLETE  Result Date: 01/23/2019   ECHOCARDIOGRAM REPORT   Patient Name:   Mary Farrell Date of Exam: 01/23/2019 Medical Rec #:  [id number]    Height:       60.0 in Accession #:  [id number]   Weight:       152.6 lb Date of Birth:  [birth year]   BSA:          1.66 m Patient Age:    73 years     BP:           136/81 mmHg Patient Gender: F            HR:           107 bpm. Exam Location:  Inpatient Procedure: 2D Echo, Color Doppler and Cardiac Doppler Indications:    R00.2 Palpitations  History:        Patient has prior history of Echocardiogram examinations, most                 recent 12/13/2018. CHF, Arrythmias:Atrial Fibrillation; Risk                 Factors:Sleep Apnea and Hypertension. 12/13/18 Mitral Valve                 replaced with 33mm Magna Mitral Ease and LAA clipped.  Sonographer:    Raquel Sarna Senior RDCS Referring Phys: [id number] [place]  1. Left ventricular ejection fraction, by visual estimation, is 55 to 60%. The left ventricle has normal function. Left ventricular septal wall thickness was  mildly increased. Moderately increased left ventricular posterior wall thickness. There is mildly increased left ventricular hypertrophy.  2. Left ventricular diastolic parameters are indeterminate.  3. The left ventricle demonstrates regional wall motion abnormalities.  4. Elevated LVEDP. Hypokinesis of the basal septum consistent with post-operative state.  5. Global right ventricle has normal systolic function.The right ventricular size is normal. No increase in right ventricular wall thickness.  6. Left atrial size was severely dilated.  7. Right atrial size was normal.  8. The mitral valve has been repaired/replaced. No evidence of mitral valve regurgitation. No evidence of mitral stenosis.  9. The tricuspid valve is normal in structure. 10. The aortic valve is tricuspid. Aortic valve regurgitation is mild. No evidence of aortic valve sclerosis or stenosis. 11. The pulmonic valve was normal in structure. Pulmonic valve regurgitation is not visualized. 12. Normal pulmonary artery systolic pressure. 13. The inferior vena cava is normal in size with <50% respiratory variability, suggesting right atrial pressure of 8 mmHg. In comparison to the previous echocardiogram(s): Bioprosthetic mitral valve now present and functioning properly. Mean gradient 5 mmHg. FINDINGS  Left Ventricle: Left ventricular ejection fraction, by visual estimation, is 55 to 60%. The left ventricle has normal function. The left ventricle demonstrates regional wall motion abnormalities. Moderately increased left ventricular posterior wall thickness. There is mildly increased left ventricular hypertrophy. Left ventricular diastolic parameters are indeterminate. Normal left atrial pressure. Elevated LVEDP. Hypokinesis of the basal septum consistent with post-operative state. Right Ventricle: The right ventricular size is normal. No increase in right ventricular wall thickness. Global RV systolic function is has normal systolic function. The  tricuspid regurgitant velocity is 1.85 m/s, and with an assumed right atrial pressure  of 8 mmHg, the estimated right ventricular systolic pressure is normal at 21.7 mmHg. Left Atrium: Left atrial size was severely dilated. Right Atrium: Right atrial size was normal in size Pericardium: There is no evidence of pericardial effusion. Mitral Valve: The mitral valve has been repaired/replaced. No evidence of mitral valve regurgitation. No evidence of mitral valve stenosis by observation. MV peak gradient, 10.1 mmHg. Tricuspid Valve: The tricuspid valve is normal in structure. Tricuspid valve regurgitation is mild.  Aortic Valve: The aortic valve is tricuspid. Aortic valve regurgitation is mild. The aortic valve is structurally normal, with no evidence of sclerosis or stenosis. Pulmonic Valve: The pulmonic valve was normal in structure. Pulmonic valve regurgitation is not visualized. Pulmonic regurgitation is not visualized. Aorta: The aortic root, ascending aorta and aortic arch are all structurally normal, with no evidence of dilitation or obstruction. Venous: The inferior vena cava is normal in size with less than 50% respiratory variability, suggesting right atrial pressure of 8 mmHg. IAS/Shunts: No atrial level shunt detected by color flow Doppler. There is no evidence of a patent foramen ovale. No ventricular septal defect is seen or detected. There is no evidence of an atrial septal defect.  LEFT VENTRICLE PLAX 2D LVIDd:         3.40 cm  Diastology LVIDs:         2.40 cm  LV e' lateral:   7.94 cm/s LV PW:         1.40 cm  LV E/e' lateral: 16.2 LV IVS:        1.20 cm  LV e' medial:    6.74 cm/s LVOT diam:     2.00 cm  LV E/e' medial:  19.1 LV SV:         27 ml LV SV Index:   15.70 LVOT Area:     3.14 cm  RIGHT VENTRICLE RV S prime:     6.20 cm/s TAPSE (M-mode): 1.1 cm LEFT ATRIUM             Index       RIGHT ATRIUM           Index LA diam:        4.50 cm 2.70 cm/m  RA Area:     12.30 cm LA Vol (A2C):   80.2 ml  48.21 ml/m RA Volume:   20.70 ml  12.44 ml/m LA Vol (A4C):   61.9 ml 37.21 ml/m LA Biplane Vol: 74.3 ml 44.66 ml/m  AORTIC VALVE LVOT Vmax:   130.00 cm/s LVOT Vmean:  92.200 cm/s LVOT VTI:    0.243 m  AORTA Ao Root diam: 2.90 cm Ao Asc diam:  3.30 cm MITRAL VALVE                         TRICUSPID VALVE MV Area (PHT): 4.68 cm              TR Peak grad:   13.7 mmHg MV Peak grad:  10.1 mmHg             TR Vmax:        185.00 cm/s MV Mean grad:  5.0 mmHg MV Vmax:       1.59 m/s              SHUNTS MV Vmean:      102.0 cm/s            Systemic VTI:  0.24 m MV VTI:        0.24 m                Systemic Diam: 2.00 cm MV PHT:        46.98 msec MV Decel Time: 162 msec MV E velocity: 129.00 cm/s 103 cm/s MV A velocity: 37.20 cm/s  70.3 cm/s MV E/A ratio:  3.47        1.5  Skeet Latch MD Electronically signed by Skeet Latch MD Signature  Date/Time: 01/23/2019/12:17:40 PM    Final      Assessment & Plan    1.  Persistent atrial fibrillation/flutter She is post ablation 2019 with Dr Curt Bears, repeat 05/2017; MAZE 11/20.  She has had recurrent atrial arrhythmias despite flecainide and cardioveresion this morning She has previously tried amiodarone but had severe nausea and is unwilling to take again Will start Tikosyn after Flecainide wash out, likely tomorrow evening for first dose Continue Warfarin for CHADS2VASC of 4 (dosing per pharmacy) QTc was ok in SR post cardioversion  2.  HTN Stable No change required today  3.  Mitral stenosis s/p MVR/MAZE Stable No change required today   For questions or updates, please contact [place] Please consult [email or web address] for contact info under Cardiology/STEMI.  Signed, Chanetta Marshall, NP  01/24/2019, 11:55 AM    I have seen, examined the patient, and reviewed the above assessment and plan.  Changes to above are made where necessary.  On exam, irrr,  She has ongoing issues with refractory arrhythmias.  We will stop flecainide which is no  longer effective.  She has not previously tolerated amiodarone due to nausea.  Start tikosyn tomorrow evening after flecainide washout.  Thompson Grayer MD, [place] 01/24/2019

## 2019-01-24 NOTE — Progress Notes (Signed)
[place] for warfarin Indication: afib and bioMVR  Allergies  Allergen Reactions  . Amoxicillin Diarrhea    Has patient had a PCN reaction causing immediate rash, facial/tongue/throat swelling, SOB or lightheadedness with hypotension: no Has patient had a PCN reaction causing severe rash involving mucus membranes or skin necrosis: no Has patient had a PCN reaction that required hospitalization: no Has patient had a PCN reaction occurring within the last 10 years: no If all of the above answers are "NO", then may proceed with Cephalosporin use.   . Metoprolol Tartrate Other (See Comments)    Mouth sores "mouth tastes like mold"  . Oxaprozin Nausea Only    Patient Measurements: Height: 5' (152.4 cm) Weight: 152 lb 8.9 oz (69.2 kg) IBW/kg (Calculated) : 45.5   Vital Signs: Temp: 97.5 F (36.4 C) (12/30 0916) Temp Source: Axillary (12/30 0916) BP: 109/49 (12/30 0916) Pulse Rate: 81 (12/30 0916)  Labs: Recent Labs    01/22/19 2131 01/22/19 2329 01/23/19 0801 01/24/19 0219  HGB 12.0  --   --   --   HCT 39.4  --   --   --   PLT 355  --   --   --   LABPROT 25.0*  --  27.7* 28.3*  INR 2.3*  --  2.6* 2.7*  CREATININE 0.78  --   --   --   TROPONINIHS 27* 21*  --   --     Estimated Creatinine Clearance: 52.8 mL/min (by C-G formula based on SCr of 0.78 mg/dL).   Medical History: Past Medical History:  Diagnosis Date  . Anxiety 06/29/2012  . Aortic regurgitation 08/13/2015  . Arthritis    "hands, wrists, back, feet, toes" (06/24/2017)  . Atrial flutter ([place]) 09/18/2015  . Atypical chest pain 05/13/2016  . Chest pain    remote cath in 1996 with NORMAL coronaries noted  . CHF (congestive heart failure) ([place])   . Dyspnea 10/30/2010  . Essential hypertension 09/05/2013  . Exogenous obesity    history of   . GERD (gastroesophageal reflux disease)   . Glaucoma, both eyes   . Headache, migraine    "stopped in my 50's" (09/18/2015)  .  History of cardiovascular stress test 2007   showing no ischemia  . Hypertension   . Mitral stenosis and aortic insufficiency 06/23/2010  . Mitral stenosis with insufficiency   . Mitral stenosis with regurgitation   . Murmur    of mitral stenosis and moderate aortic insufficiency      . Nausea 05/01/2018  . OSA on CPAP   . Palpitations    occasional  . Paroxysmal A-fib ([place]) 06/24/2017  . Pericardial effusion 09/18/2015  . Persistent atrial fibrillation ([place]) 03/10/2017  . Personal history of rheumatic heart disease   . S/P minimally-invasive maze operation for atrial fibrillation 12/13/2018   Complete bilateral atrial lesion set using cryothermy and bipolar radiofrequency ablation with clipping of LA appendage via right mini-thoracotomy approach  . S/P minimally-invasive mitral valve replacement with bioprosthetic valve 12/13/2018   31 mm [facility] Mitral stented bovine pericardial tissue valve  . SBE (subacute bacterial endocarditis)    prophylaxis, patient unaware  . Sliding hiatal hernia   . Vertigo 05/01/2018    Medications:  Medications Prior to Admission  Medication Sig Dispense Refill Last Dose  . acetaminophen (TYLENOL) 500 MG tablet Take 1,000 mg by mouth every 6 (six) hours as needed for moderate pain or headache.    Past Month at  Unknown time  . ALPRAZolam (XANAX) 0.25 MG tablet Take 0.125 mg by mouth 2 (two) times daily as needed for anxiety.    Past Week at Unknown time  . aspirin EC 81 MG EC tablet Take 1 tablet (81 mg total) by mouth daily.   01/22/2019 at Unknown time  . carvedilol (COREG) 6.25 MG tablet Take 1 tablet (6.25 mg total) by mouth 2 (two) times daily. 30 tablet 6 01/22/2019 at 1800  . cetirizine (ZYRTEC) 10 MG tablet Take 10 mg by mouth at bedtime.    01/21/2019 at Unknown time  . Cholecalciferol (VITAMIN D) 2000 UNITS CAPS Take 2,000 Units by mouth daily.     01/22/2019 at Unknown time  . conjugated estrogens (PREMARIN) vaginal cream Place 1 Applicatorful  vaginally daily as needed (irritation).    Past Month at Unknown time  . dorzolamide-timolol (COSOPT) 22.3-6.8 MG/ML ophthalmic solution Place 1 drop into both eyes daily.   01/22/2019 at Unknown time  . doxepin (SINEQUAN) 10 MG capsule Take 10-20 mg by mouth at bedtime as needed (sleep).    Past Month at Unknown time  . famotidine (PEPCID) 20 MG tablet Take 20 mg by mouth at bedtime.   01/22/2019 at Unknown time  . furosemide (LASIX) 40 MG tablet Take 1 tablet (40 mg total) by mouth daily. Take daily for 2 weeks, then take 1 tablet prn on days you notice weight gain of 3 lbs or more 30 tablet 1 01/22/2019 at Unknown time  . gabapentin (NEURONTIN) 300 MG capsule Take 300 mg by mouth 3 (three) times daily.    01/22/2019 at Unknown time  . lactobacillus acidophilus (BACID) TABS tablet Take 1 tablet by mouth daily.    01/22/2019 at Unknown time  . latanoprost (XALATAN) 0.005 % ophthalmic solution Place 1 drop into both eyes at bedtime.   6 01/21/2019 at Unknown time  . losartan (COZAAR) 25 MG tablet Take 25 mg by mouth daily.   01/22/2019 at Unknown time  . Multiple Vitamin (MULTIVITAMIN WITH MINERALS) TABS tablet Take 1 tablet by mouth daily.   01/22/2019 at Unknown time  . NON FORMULARY Apply 1 application topically 2 (two) times daily as needed (for eczema). Traimcinolone/[facility] Moist Cream   Past Month at Unknown time  . omeprazole (PRILOSEC) 40 MG capsule Take 40 mg by mouth daily.    01/22/2019 at Unknown time  . oxybutynin (DITROPAN-XL) 10 MG 24 hr tablet Take 10 mg by mouth daily.    01/22/2019 at Unknown time  . polyethylene glycol (MIRALAX / GLYCOLAX) 17 g packet Take 17 g by mouth daily as needed for moderate constipation.    Past Month at Unknown time  . potassium chloride (KLOR-CON) 10 MEQ tablet Take 1 tablet (10 mEq total) by mouth daily. 30 tablet 2 01/22/2019 at Unknown time  . warfarin (COUMADIN) 2.5 MG tablet Take 1 tablet (2.5 mg total) by mouth daily at 6 PM. Take 1 tablet by mouth  daily or as directed by coumadin clinic (Patient taking differently: Take 2.5 mg by mouth See admin instructions. Take 2 tablets on Monday then take 1 tablet all other days) 100 tablet 1 01/22/2019 at 1800  . PRESCRIPTION MEDICATION Inhale into the lungs at bedtime. CPAP       Assessment: 73 yo female with SVT on warfarin PTA for afib (also with minimally invasive bioprosthetic MVR and MAZE in 11/2018). Pharmacy consulted to dose warfarin. She is now noted s/p DCCV -INR= 2.7  Home dose: 2.5mg /d except take  5mg  on Mon (last clinic visit 12/9; INR was 2.9)  Goal of Therapy:  INR 2-3 Monitor platelets by anticoagulation protocol: Yes   Plan:   -Continue warfarin 2.5mg /day -Daily PT/INR  Hildred Laser, PharmD Clinical Pharmacist **Pharmacist phone directory can now be found on [email or web address] ([email or web address]).  Listed under [place].

## 2019-01-25 LAB — BASIC METABOLIC PANEL
Anion gap: 5 (ref 5–15)
BUN: 12 mg/dL (ref 8–23)
CO2: 28 mmol/L (ref 22–32)
Calcium: 8.6 mg/dL — ABNORMAL LOW (ref 8.9–10.3)
Chloride: 109 mmol/L (ref 98–111)
Creatinine, Ser: 0.71 mg/dL (ref 0.44–1.00)
GFR calc Af Amer: >60 mL/min (ref >60)
GFR calc non Af Amer: >60 mL/min (ref >60)
Glucose, Bld: 104 mg/dL — ABNORMAL HIGH (ref 70–99)
Potassium: 4.1 mmol/L (ref 3.5–5.1)
Sodium: 142 mmol/L (ref 135–145)

## 2019-01-25 LAB — PROTIME-INR
INR: 2.5 — ABNORMAL HIGH (ref 0.8–1.2)
Prothrombin Time: 27.3 seconds — ABNORMAL HIGH (ref 11.4–15.2)

## 2019-01-25 LAB — MAGNESIUM: Magnesium: 2.4 mg/dL (ref 1.7–2.4)

## 2019-01-25 MED ORDER — DILTIAZEM HCL-DEXTROSE 125-5 MG/125ML-% IV SOLN (PREMIX)
5.0000 mg/h | INTRAVENOUS | Status: DC
  Administered 2019-01-25: 5 mg/h via INTRAVENOUS
  Filled 2019-01-25: qty 125

## 2019-01-25 MED ORDER — ONDANSETRON HCL 4 MG/2ML IJ SOLN
4.0000 mg | Freq: Four times a day (QID) | INTRAMUSCULAR | Status: DC | PRN
  Administered 2019-01-25 – 2019-01-27 (×4): 4 mg via INTRAVENOUS
  Filled 2019-01-25 (×6): qty 2

## 2019-01-25 NOTE — Progress Notes (Addendum)
Electrophysiology Rounding Note  Patient Name: Mary Farrell Date of Encounter: 01/25/2019  Primary Cardiologist: Oval Linsey Electrophysiologist: Curt Bears   Subjective   The patient is nauseated this morning, +palpitations  Inpatient Medications    Scheduled Meds: . aspirin EC  81 mg Oral Daily  . carvedilol  12.5 mg Oral BID WC  . dofetilide  500 mcg Oral BID  . famotidine  20 mg Oral QHS  . oxybutynin  10 mg Oral Daily  . sodium chloride flush  3 mL Intravenous Once  . sodium chloride flush  3 mL Intravenous Q12H  . warfarin  2.5 mg Oral q1800  . Warfarin - Pharmacist Dosing Inpatient   Does not apply q1800   Continuous Infusions: . sodium chloride Stopped (01/24/19 1710)  . diltiazem (CARDIZEM) infusion     PRN Meds: sodium chloride, acetaminophen, acetaminophen, ALPRAZolam, doxepin, ondansetron (ZOFRAN) IV, sodium chloride flush, zolpidem   Vital Signs    Vitals:   01/25/19 0000 01/25/19 0338 01/25/19 0400 01/25/19 0744  BP: (!) 102/50  112/82 (!) 123/91  Pulse: (!) 126  (!) 132 (!) 140  Resp: 18  19 13   Temp:  98.2 F (36.8 C)  97.7 F (36.5 C)  TempSrc:  Oral  Oral  SpO2: 100%  100% 99%  Weight:      Height:        Intake/Output Summary (Last 24 hours) at 01/25/2019 0846 Last data filed at 01/25/2019 0700 Gross per 24 hour  Intake 261.58 ml  Output 800 ml  Net -538.42 ml   Filed Weights   01/23/19 0200  Weight: 69.2 kg    Physical Exam    GEN- The patient is well appearing, alert and oriented x 3 today.   Head- normocephalic, atraumatic Eyes-  Sclera clear, conjunctiva pink Ears- hearing intact Oropharynx- clear Neck- supple Lungs- Clear to ausculation bilaterally, normal work of breathing Heart- Tachycardic regular rate and rhythm  GI- soft, NT, ND, + BS Extremities- no clubbing, cyanosis, or edema Skin- no rash or lesion Psych- euthymic mood, full affect Neuro- strength and sensation are intact  Labs    CBC Recent Labs   01/22/19 2131  WBC 8.2  HGB 12.0  HCT 39.4  MCV 90.8  PLT [id number]   Basic Metabolic Panel Recent Labs    01/24/19 1330 01/25/19 0242  NA 141 142  K 3.9 4.1  CL 106 109  CO2 22 28  GLUCOSE 128* 104*  BUN 15 12  CREATININE 0.72 0.71  CALCIUM 8.8* 8.6*  MG 1.9 2.4     Telemetry    Atrial fib/flutter, rates 140's (personally reviewed)  Radiology    No results found.    Assessment & Plan    1.  Persistent atrial fibrillation/flutter Failed Flecainide Will start Tikosyn tonight after Flecainide washout Labs ok, QT ok in SR Will start Dilt drip today to help with rates and symptoms Continue Warfarin for CHADS2VASC of 4  2.  HTN Stable No change required today  3.  Mitral stenosis s/p MVR/MAZE Stable No change required today   For questions or updates, please contact [place] Please consult [email or web address] for contact info under Cardiology/STEMI.  Signed, Chanetta Marshall, NP  01/25/2019, 8:46 AM    I have seen, examined the patient, and reviewed the above assessment and plan.  Changes to above are made where necessary.  On exam, iRRR.  Start IV diltiazem drip due to RVR. Start tikosyn this evening.  Will remain in the hospital for  tikosyn load.  Co Sign: Thompson Grayer, MD 01/25/2019 1:33 PM

## 2019-01-25 NOTE — Progress Notes (Signed)
[place] for warfarin Indication: afib and bioMVR  Allergies  Allergen Reactions  . Amoxicillin Diarrhea    Has patient had a PCN reaction causing immediate rash, facial/tongue/throat swelling, SOB or lightheadedness with hypotension: no Has patient had a PCN reaction causing severe rash involving mucus membranes or skin necrosis: no Has patient had a PCN reaction that required hospitalization: no Has patient had a PCN reaction occurring within the last 10 years: no If all of the above answers are "NO", then may proceed with Cephalosporin use.   . Metoprolol Tartrate Other (See Comments)    Mouth sores "mouth tastes like mold"  . Oxaprozin Nausea Only    Patient Measurements: Height: 5' (152.4 cm) Weight: 152 lb 8.9 oz (69.2 kg) IBW/kg (Calculated) : 45.5   Vital Signs: Temp: 98 F (36.7 C) (12/31 1204) Temp Source: Oral (12/31 1204) BP: 107/61 (12/31 1230) Pulse Rate: 73 (12/31 1204)  Labs: Recent Labs    01/22/19 2131 01/22/19 2329 01/23/19 0801 01/24/19 0219 01/24/19 1330 01/25/19 0242  HGB 12.0  --   --   --   --   --   HCT 39.4  --   --   --   --   --   PLT 355  --   --   --   --   --   LABPROT 25.0*  --  27.7* 28.3*  --  27.3*  INR 2.3*  --  2.6* 2.7*  --  2.5*  CREATININE 0.78  --   --   --  0.72 0.71  TROPONINIHS 27* 21*  --   --   --   --     Estimated Creatinine Clearance: 52.8 mL/min (by C-G formula based on SCr of 0.71 mg/dL).   Medical History: Past Medical History:  Diagnosis Date  . Anxiety 06/29/2012  . Aortic regurgitation 08/13/2015  . Arthritis    "hands, wrists, back, feet, toes" (06/24/2017)  . Atrial flutter ([place]) 09/18/2015  . Atypical chest pain 05/13/2016  . Chest pain    remote cath in 1996 with NORMAL coronaries noted  . CHF (congestive heart failure) ([place])   . Dyspnea 10/30/2010  . Essential hypertension 09/05/2013  . Exogenous obesity    history of   . GERD (gastroesophageal reflux  disease)   . Glaucoma, both eyes   . Headache, migraine    "stopped in my 50's" (09/18/2015)  . History of cardiovascular stress test 2007   showing no ischemia  . Hypertension   . Mitral stenosis and aortic insufficiency 06/23/2010  . Mitral stenosis with insufficiency   . Mitral stenosis with regurgitation   . Murmur    of mitral stenosis and moderate aortic insufficiency      . Nausea 05/01/2018  . OSA on CPAP   . Palpitations    occasional  . Paroxysmal A-fib ([place]) 06/24/2017  . Pericardial effusion 09/18/2015  . Persistent atrial fibrillation ([place]) 03/10/2017  . Personal history of rheumatic heart disease   . S/P minimally-invasive maze operation for atrial fibrillation 12/13/2018   Complete bilateral atrial lesion set using cryothermy and bipolar radiofrequency ablation with clipping of LA appendage via right mini-thoracotomy approach  . S/P minimally-invasive mitral valve replacement with bioprosthetic valve 12/13/2018   31 mm [facility] Mitral stented bovine pericardial tissue valve  . SBE (subacute bacterial endocarditis)    prophylaxis, patient unaware  . Sliding hiatal hernia   . Vertigo 05/01/2018    Medications:  Medications Prior  to Admission  Medication Sig Dispense Refill Last Dose  . acetaminophen (TYLENOL) 500 MG tablet Take 1,000 mg by mouth every 6 (six) hours as needed for moderate pain or headache.    Past Month at Unknown time  . ALPRAZolam (XANAX) 0.25 MG tablet Take 0.125 mg by mouth 2 (two) times daily as needed for anxiety.    Past Week at Unknown time  . aspirin EC 81 MG EC tablet Take 1 tablet (81 mg total) by mouth daily.   01/22/2019 at Unknown time  . carvedilol (COREG) 6.25 MG tablet Take 1 tablet (6.25 mg total) by mouth 2 (two) times daily. 30 tablet 6 01/22/2019 at 1800  . cetirizine (ZYRTEC) 10 MG tablet Take 10 mg by mouth at bedtime.    01/21/2019 at Unknown time  . Cholecalciferol (VITAMIN D) 2000 UNITS CAPS Take 2,000 Units by mouth daily.      01/22/2019 at Unknown time  . conjugated estrogens (PREMARIN) vaginal cream Place 1 Applicatorful vaginally daily as needed (irritation).    Past Month at Unknown time  . dorzolamide-timolol (COSOPT) 22.3-6.8 MG/ML ophthalmic solution Place 1 drop into both eyes daily.   01/22/2019 at Unknown time  . doxepin (SINEQUAN) 10 MG capsule Take 10-20 mg by mouth at bedtime as needed (sleep).    Past Month at Unknown time  . famotidine (PEPCID) 20 MG tablet Take 20 mg by mouth at bedtime.   01/22/2019 at Unknown time  . furosemide (LASIX) 40 MG tablet Take 1 tablet (40 mg total) by mouth daily. Take daily for 2 weeks, then take 1 tablet prn on days you notice weight gain of 3 lbs or more 30 tablet 1 01/22/2019 at Unknown time  . gabapentin (NEURONTIN) 300 MG capsule Take 300 mg by mouth 3 (three) times daily.    01/22/2019 at Unknown time  . lactobacillus acidophilus (BACID) TABS tablet Take 1 tablet by mouth daily.    01/22/2019 at Unknown time  . latanoprost (XALATAN) 0.005 % ophthalmic solution Place 1 drop into both eyes at bedtime.   6 01/21/2019 at Unknown time  . losartan (COZAAR) 25 MG tablet Take 25 mg by mouth daily.   01/22/2019 at Unknown time  . Multiple Vitamin (MULTIVITAMIN WITH MINERALS) TABS tablet Take 1 tablet by mouth daily.   01/22/2019 at Unknown time  . NON FORMULARY Apply 1 application topically 2 (two) times daily as needed (for eczema). Traimcinolone/[facility] Moist Cream   Past Month at Unknown time  . omeprazole (PRILOSEC) 40 MG capsule Take 40 mg by mouth daily.    01/22/2019 at Unknown time  . oxybutynin (DITROPAN-XL) 10 MG 24 hr tablet Take 10 mg by mouth daily.    01/22/2019 at Unknown time  . polyethylene glycol (MIRALAX / GLYCOLAX) 17 g packet Take 17 g by mouth daily as needed for moderate constipation.    Past Month at Unknown time  . potassium chloride (KLOR-CON) 10 MEQ tablet Take 1 tablet (10 mEq total) by mouth daily. 30 tablet 2 01/22/2019 at Unknown time  . warfarin  (COUMADIN) 2.5 MG tablet Take 1 tablet (2.5 mg total) by mouth daily at 6 PM. Take 1 tablet by mouth daily or as directed by coumadin clinic (Patient taking differently: Take 2.5 mg by mouth See admin instructions. Take 2 tablets on Monday then take 1 tablet all other days) 100 tablet 1 01/22/2019 at 1800  . PRESCRIPTION MEDICATION Inhale into the lungs at bedtime. CPAP       Assessment: 73  yo female with SVT on warfarin PTA for afib (also with minimally invasive bioprosthetic MVR and MAZE in 11/2018). Pharmacy consulted to dose warfarin. She is now noted s/p DCCV and initiating on Tikosyn. INR remains therapeutic at 2.5. CBC wnl. No active bleed issues documented.   Home dose: 2.5mg /d except take 5mg  on Mon (last clinic visit 12/9; INR was 2.9)  Goal of Therapy:  INR 2-3 Monitor platelets by anticoagulation protocol: Yes   Plan:   Continue warfarin 2.5 mg PO daily Monitor daily INR, CBC, s/sx bleeding   Elicia Lamp, PharmD, BCPS Please check AMION for all [place] contact numbers Clinical Pharmacist 01/25/2019 1:19 PM

## 2019-01-25 NOTE — TOC Benefit Eligibility Note (Signed)
Transition of Care [facility]) Benefit Eligibility Note    Patient Details  Name: Mary Farrell MRN: [id number] Date of Birth: 10/27/1945   Medication/Dose: Phyllis Ginger 125 MCG BID    250 MCG BID   500 MCG BID  Covered?: Yes  Tier: 3 Drug  Prescription Coverage [facility]erred [facility]: [facility]  Spoke with Person/Company/Phone Number:: MARY   @ [employer] # [phone]  Co-Pay: $50.00    FOR EACH PRESCRIPTION  Prior Approval: No  Deductible: Met  Additional Notes: DOFETILIDE  125 MCG BID , 250 MCG BID ,500 MCG BID , COVER- YES, CO-PAY- $7.00 FOR EACH PRESCRIPTION, TIER- 1 DRUG, P/A-NO    Memory Argue Phone Number: 01/25/2019, 2:10 PM

## 2019-01-26 DIAGNOSIS — I10 Essential (primary) hypertension: Secondary | ICD-10-CM

## 2019-01-26 LAB — BASIC METABOLIC PANEL
Anion gap: 4 — ABNORMAL LOW (ref 5–15)
BUN: 11 mg/dL (ref 8–23)
CO2: 29 mmol/L (ref 22–32)
Calcium: 8.3 mg/dL — ABNORMAL LOW (ref 8.9–10.3)
Chloride: 107 mmol/L (ref 98–111)
Creatinine, Ser: 0.69 mg/dL (ref 0.44–1.00)
GFR calc Af Amer: >60 mL/min (ref >60)
GFR calc non Af Amer: >60 mL/min (ref >60)
Glucose, Bld: 97 mg/dL (ref 70–99)
Potassium: 3.8 mmol/L (ref 3.5–5.1)
Sodium: 140 mmol/L (ref 135–145)

## 2019-01-26 LAB — PROTIME-INR
INR: 2.9 — ABNORMAL HIGH (ref 0.8–1.2)
Prothrombin Time: 30.4 seconds — ABNORMAL HIGH (ref 11.4–15.2)

## 2019-01-26 LAB — MAGNESIUM: Magnesium: 2.2 mg/dL (ref 1.7–2.4)

## 2019-01-26 MED ORDER — POTASSIUM CHLORIDE CRYS ER 20 MEQ PO TBCR
40.0000 meq | EXTENDED_RELEASE_TABLET | Freq: Once | ORAL | Status: AC
  Administered 2019-01-26: 40 meq via ORAL
  Filled 2019-01-26: qty 2

## 2019-01-26 MED ORDER — DILTIAZEM HCL ER COATED BEADS 120 MG PO CP24
120.0000 mg | ORAL_CAPSULE | Freq: Every day | ORAL | Status: DC
  Administered 2019-01-26 – 2019-01-28 (×3): 120 mg via ORAL
  Filled 2019-01-26 (×3): qty 1

## 2019-01-26 NOTE — Progress Notes (Signed)
Started on PO Tikosyn at 2000. Still receiving Diltiazem at 2.5mg /hr. QTC's started trending upward >500 around midnight. Cardiology notified of occurrence.

## 2019-01-26 NOTE — Progress Notes (Signed)
Pharmacy: Dofetilide (Tikosyn) - Follow Up Assessment and Electrolyte Replacement  Pharmacy consulted to assist in monitoring and replacing electrolytes in this 74 y.o. female admitted on 01/22/2019 undergoing dofetilide initiation. First dofetilide dose: 01/24/19 @ 8pm  Labs:    Component Value Date/Time   K 3.8 01/26/2019 0242   MG 2.2 01/26/2019 0242     Plan: Potassium: K 3.8-3.9:  Give KCl 40 mEq po x1   Magnesium: Mg > 2: No additional supplementation needed  Thank you for allowing pharmacy to participate in this patient's care   Vertis Kelch, PharmD, [facility] PGY2 Cardiology Pharmacy Resident Phone [phone] 01/26/2019       1:21 PM  Please check [email or web address] for unit-specific pharmacist phone numbers

## 2019-01-26 NOTE — Progress Notes (Signed)
[place] for warfarin Indication: afib and bioMVR  Allergies  Allergen Reactions  . Amoxicillin Diarrhea    Has patient had a PCN reaction causing immediate rash, facial/tongue/throat swelling, SOB or lightheadedness with hypotension: no Has patient had a PCN reaction causing severe rash involving mucus membranes or skin necrosis: no Has patient had a PCN reaction that required hospitalization: no Has patient had a PCN reaction occurring within the last 10 years: no If all of the above answers are "NO", then may proceed with Cephalosporin use.   . Metoprolol Tartrate Other (See Comments)    Mouth sores "mouth tastes like mold"  . Oxaprozin Nausea Only    Patient Measurements: Height: 5' (152.4 cm) Weight: 152 lb 8.9 oz (69.2 kg) IBW/kg (Calculated) : 45.5   Vital Signs: Temp: 98.6 F (37 C) (01/01 1213) Temp Source: Oral (01/01 1213) BP: 124/63 (01/01 1213) Pulse Rate: 73 (01/01 1213)  Labs: Recent Labs    01/24/19 0219 01/24/19 1330 01/25/19 0242 01/26/19 0242  LABPROT 28.3*  --  27.3* 30.4*  INR 2.7*  --  2.5* 2.9*  CREATININE  --  0.72 0.71 0.69    Estimated Creatinine Clearance: 52.8 mL/min (by C-G formula based on SCr of 0.69 mg/dL).   Medical History: Past Medical History:  Diagnosis Date  . Anxiety 06/29/2012  . Aortic regurgitation 08/13/2015  . Arthritis    "hands, wrists, back, feet, toes" (06/24/2017)  . Atrial flutter ([place]) 09/18/2015  . Atypical chest pain 05/13/2016  . Chest pain    remote cath in 1996 with NORMAL coronaries noted  . CHF (congestive heart failure) ([place])   . Dyspnea 10/30/2010  . Essential hypertension 09/05/2013  . Exogenous obesity    history of   . GERD (gastroesophageal reflux disease)   . Glaucoma, both eyes   . Headache, migraine    "stopped in my 50's" (09/18/2015)  . History of cardiovascular stress test 2007   showing no ischemia  . Hypertension   . Mitral stenosis and aortic  insufficiency 06/23/2010  . Mitral stenosis with insufficiency   . Mitral stenosis with regurgitation   . Murmur    of mitral stenosis and moderate aortic insufficiency      . Nausea 05/01/2018  . OSA on CPAP   . Palpitations    occasional  . Paroxysmal A-fib ([place]) 06/24/2017  . Pericardial effusion 09/18/2015  . Persistent atrial fibrillation ([place]) 03/10/2017  . Personal history of rheumatic heart disease   . S/P minimally-invasive maze operation for atrial fibrillation 12/13/2018   Complete bilateral atrial lesion set using cryothermy and bipolar radiofrequency ablation with clipping of LA appendage via right mini-thoracotomy approach  . S/P minimally-invasive mitral valve replacement with bioprosthetic valve 12/13/2018   31 mm [facility] Mitral stented bovine pericardial tissue valve  . SBE (subacute bacterial endocarditis)    prophylaxis, patient unaware  . Sliding hiatal hernia   . Vertigo 05/01/2018    Medications:  Medications Prior to Admission  Medication Sig Dispense Refill Last Dose  . acetaminophen (TYLENOL) 500 MG tablet Take 1,000 mg by mouth every 6 (six) hours as needed for moderate pain or headache.    Past Month at Unknown time  . ALPRAZolam (XANAX) 0.25 MG tablet Take 0.125 mg by mouth 2 (two) times daily as needed for anxiety.    Past Week at Unknown time  . aspirin EC 81 MG EC tablet Take 1 tablet (81 mg total) by mouth daily.   01/22/2019  at Unknown time  . carvedilol (COREG) 6.25 MG tablet Take 1 tablet (6.25 mg total) by mouth 2 (two) times daily. 30 tablet 6 01/22/2019 at 1800  . cetirizine (ZYRTEC) 10 MG tablet Take 10 mg by mouth at bedtime.    01/21/2019 at Unknown time  . Cholecalciferol (VITAMIN D) 2000 UNITS CAPS Take 2,000 Units by mouth daily.     01/22/2019 at Unknown time  . conjugated estrogens (PREMARIN) vaginal cream Place 1 Applicatorful vaginally daily as needed (irritation).    Past Month at Unknown time  . dorzolamide-timolol (COSOPT) 22.3-6.8  MG/ML ophthalmic solution Place 1 drop into both eyes daily.   01/22/2019 at Unknown time  . doxepin (SINEQUAN) 10 MG capsule Take 10-20 mg by mouth at bedtime as needed (sleep).    Past Month at Unknown time  . famotidine (PEPCID) 20 MG tablet Take 20 mg by mouth at bedtime.   01/22/2019 at Unknown time  . furosemide (LASIX) 40 MG tablet Take 1 tablet (40 mg total) by mouth daily. Take daily for 2 weeks, then take 1 tablet prn on days you notice weight gain of 3 lbs or more 30 tablet 1 01/22/2019 at Unknown time  . gabapentin (NEURONTIN) 300 MG capsule Take 300 mg by mouth 3 (three) times daily.    01/22/2019 at Unknown time  . lactobacillus acidophilus (BACID) TABS tablet Take 1 tablet by mouth daily.    01/22/2019 at Unknown time  . latanoprost (XALATAN) 0.005 % ophthalmic solution Place 1 drop into both eyes at bedtime.   6 01/21/2019 at Unknown time  . losartan (COZAAR) 25 MG tablet Take 25 mg by mouth daily.   01/22/2019 at Unknown time  . Multiple Vitamin (MULTIVITAMIN WITH MINERALS) TABS tablet Take 1 tablet by mouth daily.   01/22/2019 at Unknown time  . NON FORMULARY Apply 1 application topically 2 (two) times daily as needed (for eczema). Traimcinolone/[employer] Moist Cream   Past Month at Unknown time  . omeprazole (PRILOSEC) 40 MG capsule Take 40 mg by mouth daily.    01/22/2019 at Unknown time  . oxybutynin (DITROPAN-XL) 10 MG 24 hr tablet Take 10 mg by mouth daily.    01/22/2019 at Unknown time  . polyethylene glycol (MIRALAX / GLYCOLAX) 17 g packet Take 17 g by mouth daily as needed for moderate constipation.    Past Month at Unknown time  . potassium chloride (KLOR-CON) 10 MEQ tablet Take 1 tablet (10 mEq total) by mouth daily. 30 tablet 2 01/22/2019 at Unknown time  . warfarin (COUMADIN) 2.5 MG tablet Take 1 tablet (2.5 mg total) by mouth daily at 6 PM. Take 1 tablet by mouth daily or as directed by coumadin clinic (Patient taking differently: Take 2.5 mg by mouth See admin instructions.  Take 2 tablets on Monday then take 1 tablet all other days) 100 tablet 1 01/22/2019 at 1800  . PRESCRIPTION MEDICATION Inhale into the lungs at bedtime. CPAP       Assessment: 74 yo female with SVT on warfarin PTA for afib (also with minimally invasive bioprosthetic MVR and MAZE in 11/2018). Pharmacy consulted to dose warfarin. She is now noted s/p DCCV and initiating on Tikosyn. INR remains therapeutic at 2.9. CBC wnl. No active bleed issues documented.   Home dose: 2.5mg /d except take 5mg  on Mon (last clinic visit 12/9; INR was 2.9)  Goal of Therapy:  INR 2-3 Monitor platelets by anticoagulation protocol: Yes   Plan:   Continue warfarin 2.5 mg PO daily Monitor  daily INR, CBC, s/sx bleeding  Vertis Kelch, PharmD, [facility] PGY2 Cardiology Pharmacy Resident Phone [phone] 01/26/2019       1:17 PM  Please check [email or web address] for unit-specific pharmacist phone numbers

## 2019-01-26 NOTE — Progress Notes (Signed)
   Progress Note  Patient Name: Mary Farrell Date of Encounter: 01/26/2019  Primary Cardiologist: Skeet Latch, MD  Returned page from nurse regarding QT interval.  Recently in persistent atrial flutter, now sinus rhythm on Tikosyn.  Monitor reporting prolonged QTc over 500 ms, but QTc 497 ms on previous ECG and same following dose of Tikosyn today.  Will repeat ECG in 1 hour.  Also await input from Dr. Rayann Heman on rounds today.  Signed, Rozann Lesches, MD  01/26/2019, 11:10 AM

## 2019-01-26 NOTE — Progress Notes (Signed)
Paged MD to advise of QTC > 500. Awaiting response.Mary Farrell

## 2019-01-26 NOTE — Progress Notes (Signed)
Progress Note   Subjective   Doing well today.  She has been in sinus since starting diltiazem drip.  She feels "washed out" after her atrial flutter with RVR events.  Nausea is much improved but not completely resolved.  Inpatient Medications    Scheduled Meds: . aspirin EC  81 mg Oral Daily  . carvedilol  12.5 mg Oral BID WC  . dofetilide  500 mcg Oral BID  . oxybutynin  10 mg Oral Daily  . sodium chloride flush  3 mL Intravenous Q12H  . warfarin  2.5 mg Oral q1800  . Warfarin - Pharmacist Dosing Inpatient   Does not apply q1800   Continuous Infusions: . sodium chloride 250 mL (01/26/19 0320)  . diltiazem (CARDIZEM) infusion 2.5 mg/hr (01/25/19 1010)   PRN Meds: sodium chloride, acetaminophen, acetaminophen, ALPRAZolam, ondansetron (ZOFRAN) IV, sodium chloride flush, zolpidem   Vital Signs    Vitals:   01/25/19 1941 01/26/19 0018 01/26/19 0400 01/26/19 0740  BP: (!) 121/54 (!) 150/65 (!) 129/58 (!) 126/51  Pulse:  72 74 80  Resp:  18 20 13   Temp: 97.7 F (36.5 C) 97.7 F (36.5 C) 98.1 F (36.7 C) 98.2 F (36.8 C)  TempSrc: Oral Oral Oral Oral  SpO2:  100% 100% 94%  Weight:      Height:        Intake/Output Summary (Last 24 hours) at 01/26/2019 1137 Last data filed at 01/25/2019 2228 Gross per 24 hour  Intake 483 ml  Output 275 ml  Net 208 ml   Filed Weights   01/23/19 0200  Weight: 69.2 kg    Telemetry    Sinus rhythm today,  She did have a period of increase PVCs yesterday evening, but  Not closely coupled to proceeding t waves, no other arrhythmias noted since converting to sinus - Personally Reviewed  Physical Exam   GEN- The patient is well appearing, alert and oriented x 3 today.   Head- normocephalic, atraumatic Eyes-  Sclera clear, conjunctiva pink Ears- hearing intact Oropharynx- clear Neck- supple, Lungs-   normal work of breathing Heart- Regular rate and rhythm  GI- soft  Extremities- no clubbing, cyanosis, or edema  MS- no  significant deformity or atrophy Skin- no rash or lesion Psych- euthymic mood, full affect Neuro- strength and sensation are intact   Labs    Chemistry Recent Labs  Lab 01/24/19 1330 01/25/19 0242 01/26/19 0242  NA 141 142 140  K 3.9 4.1 3.8  CL 106 109 107  CO2 22 28 29   GLUCOSE 128* 104* 97  BUN 15 12 11   CREATININE 0.72 0.71 0.69  CALCIUM 8.8* 8.6* 8.3*  GFRNONAA >60 >60 >60  GFRAA >60 >60 >60  ANIONGAP 13 5 4*     Hematology Recent Labs  Lab 01/22/19 2131  WBC 8.2  RBC 4.34  HGB 12.0  HCT 39.4  MCV 90.8  MCH 27.6  MCHC 30.5  RDW 14.4  PLT 355   ekg reveals sinus with QTc 497 msec   Assessment & Plan    1.  Persistent atrial fibrillation/ post maze atrial flutter She has had a challenging course with her atrial arrhythmias.  She is s/p multiple prior ablations and MAZE.  She has recently failed to maintain sinus with flecainide and with PR prolongation, I would be reluctant to increase dosing. She has had nausea previously with amiodarone and did not tolerate it. She is very symptomatic with her atrial arrhythmias with fatigue and nausea. Interestingly,  she converted to sinus shortly after starting diltiazem drip.  I will therefore start her on lose dose oral diltiazem.  Her qt is 497 msec today.  I have reviewed her ekg from yesterday as well as her telemetry.  I feel like we should continue her current tikosyn dosing for now with close eye on her QT.  Given her very refractory atrial arrhythmias and very few options, we should give tikosyn its best chance to control her arrhythmias as long as we can do so safely with close observation.  On coumadin with pharmacy managing INR.  2. HTN Stable No change required today  3. S/p MVR/MAZE On ASA and coumadin  Dr Caryl Comes to follow over the weekend.  Possibly home on Sunday if QT remains stable with tikosyn.  Thompson Grayer MD, [facility] 01/26/2019 11:37 AM

## 2019-01-27 LAB — BASIC METABOLIC PANEL
Anion gap: 7 (ref 5–15)
BUN: 8 mg/dL (ref 8–23)
CO2: 27 mmol/L (ref 22–32)
Calcium: 8.5 mg/dL — ABNORMAL LOW (ref 8.9–10.3)
Chloride: 107 mmol/L (ref 98–111)
Creatinine, Ser: 0.64 mg/dL (ref 0.44–1.00)
GFR calc Af Amer: >60 mL/min (ref >60)
GFR calc non Af Amer: >60 mL/min (ref >60)
Glucose, Bld: 90 mg/dL (ref 70–99)
Potassium: 4 mmol/L (ref 3.5–5.1)
Sodium: 141 mmol/L (ref 135–145)

## 2019-01-27 LAB — PROTIME-INR
INR: 2.9 — ABNORMAL HIGH (ref 0.8–1.2)
Prothrombin Time: 30.6 seconds — ABNORMAL HIGH (ref 11.4–15.2)

## 2019-01-27 LAB — MAGNESIUM: Magnesium: 1.9 mg/dL (ref 1.7–2.4)

## 2019-01-27 MED ORDER — MAGNESIUM SULFATE 2 GM/50ML IV SOLN
2.0000 g | Freq: Once | INTRAVENOUS | Status: AC
  Administered 2019-01-27: 2 g via INTRAVENOUS

## 2019-01-27 NOTE — Progress Notes (Signed)
[place] for warfarin Indication: afib and bioMVR  Allergies  Allergen Reactions  . Amoxicillin Diarrhea    Has patient had a PCN reaction causing immediate rash, facial/tongue/throat swelling, SOB or lightheadedness with hypotension: no Has patient had a PCN reaction causing severe rash involving mucus membranes or skin necrosis: no Has patient had a PCN reaction that required hospitalization: no Has patient had a PCN reaction occurring within the last 10 years: no If all of the above answers are "NO", then may proceed with Cephalosporin use.   . Metoprolol Tartrate Other (See Comments)    Mouth sores "mouth tastes like mold"  . Oxaprozin Nausea Only    Patient Measurements: Height: 5' (152.4 cm) Weight: 152 lb 8.9 oz (69.2 kg) IBW/kg (Calculated) : 45.5   Vital Signs: Temp: 97.9 F (36.6 C) (01/02 1246) Temp Source: Oral (01/02 1246) BP: 127/56 (01/02 1246) Pulse Rate: 65 (01/02 1246)  Labs: Recent Labs    01/25/19 0242 01/26/19 0242 01/27/19 0211 01/27/19 0314  LABPROT 27.3* 30.4* 30.6*  --   INR 2.5* 2.9* 2.9*  --   CREATININE 0.71 0.69  --  0.64    Estimated Creatinine Clearance: 52.8 mL/min (by C-G formula based on SCr of 0.64 mg/dL).   Medical History: Past Medical History:  Diagnosis Date  . Anxiety 06/29/2012  . Aortic regurgitation 08/13/2015  . Arthritis    "hands, wrists, back, feet, toes" (06/24/2017)  . Atrial flutter ([place]) 09/18/2015  . Atypical chest pain 05/13/2016  . Chest pain    remote cath in 1996 with NORMAL coronaries noted  . CHF (congestive heart failure) ([place])   . Dyspnea 10/30/2010  . Essential hypertension 09/05/2013  . Exogenous obesity    history of   . GERD (gastroesophageal reflux disease)   . Glaucoma, both eyes   . Headache, migraine    "stopped in my 50's" (09/18/2015)  . History of cardiovascular stress test 2007   showing no ischemia  . Hypertension   . Mitral stenosis and aortic  insufficiency 06/23/2010  . Mitral stenosis with insufficiency   . Mitral stenosis with regurgitation   . Murmur    of mitral stenosis and moderate aortic insufficiency      . Nausea 05/01/2018  . OSA on CPAP   . Palpitations    occasional  . Paroxysmal A-fib ([place]) 06/24/2017  . Pericardial effusion 09/18/2015  . Persistent atrial fibrillation ([place]) 03/10/2017  . Personal history of rheumatic heart disease   . S/P minimally-invasive maze operation for atrial fibrillation 12/13/2018   Complete bilateral atrial lesion set using cryothermy and bipolar radiofrequency ablation with clipping of LA appendage via right mini-thoracotomy approach  . S/P minimally-invasive mitral valve replacement with bioprosthetic valve 12/13/2018   31 mm [facility] Mitral stented bovine pericardial tissue valve  . SBE (subacute bacterial endocarditis)    prophylaxis, patient unaware  . Sliding hiatal hernia   . Vertigo 05/01/2018    Medications:  Medications Prior to Admission  Medication Sig Dispense Refill Last Dose  . acetaminophen (TYLENOL) 500 MG tablet Take 1,000 mg by mouth every 6 (six) hours as needed for moderate pain or headache.    Past Month at Unknown time  . ALPRAZolam (XANAX) 0.25 MG tablet Take 0.125 mg by mouth 2 (two) times daily as needed for anxiety.    Past Week at Unknown time  . aspirin EC 81 MG EC tablet Take 1 tablet (81 mg total) by mouth daily.   01/22/2019  at Unknown time  . carvedilol (COREG) 6.25 MG tablet Take 1 tablet (6.25 mg total) by mouth 2 (two) times daily. 30 tablet 6 01/22/2019 at 1800  . cetirizine (ZYRTEC) 10 MG tablet Take 10 mg by mouth at bedtime.    01/21/2019 at Unknown time  . Cholecalciferol (VITAMIN D) 2000 UNITS CAPS Take 2,000 Units by mouth daily.     01/22/2019 at Unknown time  . conjugated estrogens (PREMARIN) vaginal cream Place 1 Applicatorful vaginally daily as needed (irritation).    Past Month at Unknown time  . dorzolamide-timolol (COSOPT) 22.3-6.8  MG/ML ophthalmic solution Place 1 drop into both eyes daily.   01/22/2019 at Unknown time  . doxepin (SINEQUAN) 10 MG capsule Take 10-20 mg by mouth at bedtime as needed (sleep).    Past Month at Unknown time  . famotidine (PEPCID) 20 MG tablet Take 20 mg by mouth at bedtime.   01/22/2019 at Unknown time  . furosemide (LASIX) 40 MG tablet Take 1 tablet (40 mg total) by mouth daily. Take daily for 2 weeks, then take 1 tablet prn on days you notice weight gain of 3 lbs or more 30 tablet 1 01/22/2019 at Unknown time  . gabapentin (NEURONTIN) 300 MG capsule Take 300 mg by mouth 3 (three) times daily.    01/22/2019 at Unknown time  . lactobacillus acidophilus (BACID) TABS tablet Take 1 tablet by mouth daily.    01/22/2019 at Unknown time  . latanoprost (XALATAN) 0.005 % ophthalmic solution Place 1 drop into both eyes at bedtime.   6 01/21/2019 at Unknown time  . losartan (COZAAR) 25 MG tablet Take 25 mg by mouth daily.   01/22/2019 at Unknown time  . Multiple Vitamin (MULTIVITAMIN WITH MINERALS) TABS tablet Take 1 tablet by mouth daily.   01/22/2019 at Unknown time  . NON FORMULARY Apply 1 application topically 2 (two) times daily as needed (for eczema). Traimcinolone/[facility] Moist Cream   Past Month at Unknown time  . omeprazole (PRILOSEC) 40 MG capsule Take 40 mg by mouth daily.    01/22/2019 at Unknown time  . oxybutynin (DITROPAN-XL) 10 MG 24 hr tablet Take 10 mg by mouth daily.    01/22/2019 at Unknown time  . polyethylene glycol (MIRALAX / GLYCOLAX) 17 g packet Take 17 g by mouth daily as needed for moderate constipation.    Past Month at Unknown time  . potassium chloride (KLOR-CON) 10 MEQ tablet Take 1 tablet (10 mEq total) by mouth daily. 30 tablet 2 01/22/2019 at Unknown time  . warfarin (COUMADIN) 2.5 MG tablet Take 1 tablet (2.5 mg total) by mouth daily at 6 PM. Take 1 tablet by mouth daily or as directed by coumadin clinic (Patient taking differently: Take 2.5 mg by mouth See admin instructions.  Take 2 tablets on Monday then take 1 tablet all other days) 100 tablet 1 01/22/2019 at 1800  . PRESCRIPTION MEDICATION Inhale into the lungs at bedtime. CPAP       Assessment: 74 yo female with SVT on warfarin PTA for afib (also with minimally invasive bioprosthetic MVR and MAZE in 11/2018). Pharmacy consulted to dose warfarin. She is now noted s/p DCCV and initiating on Tikosyn. INR remains therapeutic at 2.9. No active bleed issues documented.   Home dose: 2.5mg /d except take 5mg  on Mon (last clinic visit 12/9; INR was 2.9)  Goal of Therapy:  INR 2-3 Monitor platelets by anticoagulation protocol: Yes   Plan:   Continue warfarin 2.5 mg PO daily Monitor daily INR,  CBC, s/sx bleeding  Vertis Kelch, PharmD, [facility] PGY2 Cardiology Pharmacy Resident Phone [phone] 01/27/2019       2:51 PM  Please check [email or web address] for unit-specific pharmacist phone numbers

## 2019-01-27 NOTE — Progress Notes (Signed)
Pharmacy: Dofetilide (Tikosyn) - Follow Up Assessment and Electrolyte Replacement  Pharmacy consulted to assist in monitoring and replacing electrolytes in this 74 y.o. female admitted on 01/22/2019 undergoing dofetilide initiation. First dofetilide dose: 01/24/19 @ 8pm  Labs:    Component Value Date/Time   K 4.0 01/27/2019 0314   MG 1.9 01/27/2019 0314     Plan: Potassium: K >/= 4: No additional supplementation needed  Magnesium: Mg 1.8-2: Give Mg 2 gm IV x1   Thank you for allowing pharmacy to participate in this patient's care   Vertis Kelch, PharmD, [facility] PGY2 Cardiology Pharmacy Resident Phone [phone] 01/27/2019       10:19 AM  Please check [email or web address] for unit-specific pharmacist phone numbers

## 2019-01-27 NOTE — Progress Notes (Addendum)
Progress Note   Subjective    still nausea but some what improved  Anxious and frustrated Her brother died just before christmas and was not able to go to his funeral   Inpatient Medications    Scheduled Meds: . aspirin EC  81 mg Oral Daily  . carvedilol  12.5 mg Oral BID WC  . diltiazem  120 mg Oral Daily  . dofetilide  500 mcg Oral BID  . oxybutynin  10 mg Oral Daily  . sodium chloride flush  3 mL Intravenous Q12H  . warfarin  2.5 mg Oral q1800  . Warfarin - Pharmacist Dosing Inpatient   Does not apply q1800   Continuous Infusions: . sodium chloride 10 mL/hr at 01/26/19 1149   PRN Meds: sodium chloride, acetaminophen, acetaminophen, ALPRAZolam, ondansetron (ZOFRAN) IV, sodium chloride flush, zolpidem   Vital Signs    Vitals:   01/26/19 1950 01/26/19 2300 01/27/19 0319 01/27/19 0806  BP: (!) 131/59 136/67 (!) 127/56 (!) 127/56  Pulse: 77 75 77 81  Resp: 12 17 17 17   Temp: 98 F (36.7 C) 97.6 F (36.4 C) 97.8 F (36.6 C) 97.9 F (36.6 C)  TempSrc: Oral Oral Oral Oral  SpO2: 98% 96% 99% 97%  Weight:      Height:        Intake/Output Summary (Last 24 hours) at 01/27/2019 1015 Last data filed at 01/27/2019 0400 Gross per 24 hour  Intake 299.61 ml  Output 1650 ml  Net -1350.39 ml   Filed Weights   01/23/19 0200  Weight: 69.2 kg    Telemetry    Telemetry Personally reviewed  Sinus   Physical Exam  BP (!) 127/56 (BP Location: Left Arm)   Pulse 81   Temp 97.9 F (36.6 C) (Oral)   Resp 17   Ht 5' (1.524 m)   Wt 69.2 kg   SpO2 97%   BMI 29.79 kg/m  Well developed and nourished in no acute distress HENT normal Neck supple with JVP-  Flat  Clear Regular rate and rhythm, no murmurs or gallops Abd-soft with active BS No Clubbing cyanosis edema Skin-warm and dry A & Oriented  Grossly normal sensory and motor function      Labs    Chemistry Recent Labs  Lab 01/25/19 0242 01/26/19 0242 01/27/19 0314  NA 142 140 141  K 4.1 3.8 4.0  CL 109  107 107  CO2 28 29 27   GLUCOSE 104* 97 90  BUN 12 11 8   CREATININE 0.71 0.69 0.64  CALCIUM 8.6* 8.3* 8.5*  GFRNONAA >60 >60 >60  GFRAA >60 >60 >60  ANIONGAP 5 4* 7     Hematology Recent Labs  Lab 01/22/19 2131  WBC 8.2  RBC 4.34  HGB 12.0  HCT 39.4  MCV 90.8  MCH 27.6  MCHC 30.5  RDW 14.4  PLT 355   ECG personally reviewed QT about 420 and QTc about 465  Assessment & Plan    1.  Persistent atrial fibrillation/ post maze atrial flutter  Dofetilde Day 2 She has had a challenging course with her atrial arrhythmias.  She is s/p multiple prior ablations and MAZE.  She has recently failed to maintain sinus with flecainide and with PR prolongation, I would be reluctant to increase dosing. She has had nausea previously with amiodarone and did not tolerate it. She is very symptomatic with her atrial arrhythmias with fatigue and nausea. Interestingly, she converted to sinus shortly after starting diltiazem drip. On low dose  dilt Continue current dose of dofetilide    2. HTN Stable No change required today  3. S/p MVR/MAZE On ASA and coumadin  4) Nausea  Has been attributed to her heart but will need to ...  Check LFTs and amylase/lipase in am   Unfortunately no ECG yet this AM   Long discussion re possibility of AV ablation and pacing as a solution with great certainty in terms of preventing recurrent tachycardia    01/27/2019 10:15 AM

## 2019-01-28 LAB — COMPREHENSIVE METABOLIC PANEL
ALT: 13 U/L (ref 0–44)
AST: 16 U/L (ref 15–41)
Albumin: 3.1 g/dL — ABNORMAL LOW (ref 3.5–5.0)
Alkaline Phosphatase: 73 U/L (ref 38–126)
Anion gap: 8 (ref 5–15)
BUN: 5 mg/dL — ABNORMAL LOW (ref 8–23)
CO2: 29 mmol/L (ref 22–32)
Calcium: 8.7 mg/dL — ABNORMAL LOW (ref 8.9–10.3)
Chloride: 104 mmol/L (ref 98–111)
Creatinine, Ser: 0.64 mg/dL (ref 0.44–1.00)
GFR calc Af Amer: >60 mL/min (ref >60)
GFR calc non Af Amer: >60 mL/min (ref >60)
Glucose, Bld: 95 mg/dL (ref 70–99)
Potassium: 3.9 mmol/L (ref 3.5–5.1)
Sodium: 141 mmol/L (ref 135–145)
Total Bilirubin: 0.8 mg/dL (ref 0.3–1.2)
Total Protein: 5.7 g/dL — ABNORMAL LOW (ref 6.5–8.1)

## 2019-01-28 LAB — PROTIME-INR
INR: 2.5 — ABNORMAL HIGH (ref 0.8–1.2)
Prothrombin Time: 26.9 seconds — ABNORMAL HIGH (ref 11.4–15.2)

## 2019-01-28 LAB — LIPASE, BLOOD: Lipase: 22 U/L (ref 11–51)

## 2019-01-28 LAB — AMYLASE: Amylase: 29 U/L (ref 28–100)

## 2019-01-28 LAB — GLUCOSE, CAPILLARY: Glucose-Capillary: 94 mg/dL (ref 70–99)

## 2019-01-28 MED ORDER — CARVEDILOL 12.5 MG PO TABS: 12.5000 mg | ORAL_TABLET | Freq: Two times a day (BID) | ORAL | 2 refills | Status: DC

## 2019-01-28 MED ORDER — POTASSIUM CHLORIDE CRYS ER 20 MEQ PO TBCR
40.0000 meq | EXTENDED_RELEASE_TABLET | Freq: Once | ORAL | Status: AC
  Administered 2019-01-28: 40 meq via ORAL
  Filled 2019-01-28: qty 2

## 2019-01-28 MED ORDER — FUROSEMIDE 40 MG PO TABS: 40.0000 mg | ORAL_TABLET | Freq: Every day | ORAL | 1 refills | Status: DC | PRN

## 2019-01-28 MED ORDER — POTASSIUM CHLORIDE ER 10 MEQ PO TBCR: 10.0000 meq | EXTENDED_RELEASE_TABLET | Freq: Every day | ORAL | 2 refills | Status: DC | PRN

## 2019-01-28 MED ORDER — DOFETILIDE 500 MCG PO CAPS: 500.0000 ug | ORAL_CAPSULE | Freq: Two times a day (BID) | ORAL | 2 refills | Status: DC

## 2019-01-28 MED ORDER — DILTIAZEM HCL ER COATED BEADS 120 MG PO CP24: 120.0000 mg | ORAL_CAPSULE | Freq: Every day | ORAL | 2 refills | Status: DC

## 2019-01-28 NOTE — Discharge Summary (Signed)
Discharge Summary    Patient ID: Mary Farrell MRN: [id number]; DOB: [birth year]  Admit date: 01/22/2019 Discharge date: 01/28/2019  Primary Care Provider: Lawerance Cruel, MD  Primary Cardiologist: Skeet Latch, MD  Primary Electrophysiologist:  Will Meredith Leeds, MD   Discharge Diagnoses    Principal Problem:   Persistent atrial fibrillation/flutter Active Problems:   Essential hypertension   Mitral stenosis with regurgitation s/p MVR   SVT (supraventricular tachycardia) [facility])    Diagnostic Studies/Procedures    Echocardiogram 01/23/2019: Impressions:  1. Left ventricular ejection fraction, by visual estimation, is 55 to 60%. The left ventricle has normal function. Left ventricular septal wall thickness was mildly increased. Moderately increased left ventricular posterior wall thickness. There is  mildly increased left ventricular hypertrophy.  2. Left ventricular diastolic parameters are indeterminate.  3. The left ventricle demonstrates regional wall motion abnormalities.  4. Elevated LVEDP. Hypokinesis of the basal septum consistent with post-operative state.  5. Global right ventricle has normal systolic function.The right ventricular size is normal. No increase in right ventricular wall thickness.  6. Left atrial size was severely dilated.  7. Right atrial size was normal.  8. The mitral valve has been repaired/replaced. No evidence of mitral valve regurgitation. No evidence of mitral stenosis.  9. The tricuspid valve is normal in structure. 10. The aortic valve is tricuspid. Aortic valve regurgitation is mild. No evidence of aortic valve sclerosis or stenosis. 11. The pulmonic valve was normal in structure. Pulmonic valve regurgitation is not visualized. 12. Normal pulmonary artery systolic pressure. 13. The inferior vena cava is normal in size with <50% respiratory variability, suggesting right atrial pressure of 8 mmHg. _____________  DCCV  01/24/2019: Patient placed on cardiac monitor, pulse oximetry, supplemental oxygen as necessary.  Sedation given: propofol per anesthesia Pacer pads placed anterior and posterior chest.  Cardioverted 1 time(s).  Cardioverted at 150Jbiphasic.  Impression: Findings: Post procedure EKG shows: NSR Complications: None Patient did tolerate procedure well.  Plan: Successful DCCV with a single 150J biphasic shock.   History of Present Illness     Mary Farrell is a 74 y.o. female with a history of normal coronaries on cardiac catheterization in 09/2018 and valvular paroxymal atrial fibrillation and atrial flutter in context of mitral stenosis/regurgitation s/p minimally invasive bioprosthetic MVR and MAZE in 11/2018 who presented to the ED on 01/22/2019 with palpitations. She has had multiple cardioversion in the past. She also had atrial fibrillation/ablation in 02/2017 with repeat ablation in 05/2017. She has tried Amiodarone in the past but was not able to tolerate this. She more recently tried Flecainide but was not able to maintain sinus rhythm with this and was noted to have PR prolongation so dose was not able to be increased.  The patient was in her usual state of good health until she woke up on morning of presentation with palpitations and noted that her heart rate was 160 on her BP monitor. She called the Atrial Fibrillation clinic who instructed her to take an extra dose of Coreg, which unfortunately had no effect. She was in the process of dressing to come to the ED when she rechecked her pulse and noted it to be 90, but her palpitations returned shortly and she presented to the ED for evaluation after an additional dose of beta blocker. Of note, during her prior episodes of atrial fibrillation and atrial flutter her heart rates were typically 120s - 130s. She has never had a pulse this high during atrial arrhythmias before. Also  of note, Flecainide was recently discontinued (12/17) in favor  of Coreg. She is anticoagulated with warfarin and her INR today was 2.3.   Upon arrival, she was well appearing, normotensive with heart rate of 160, regular, narrow complex tachycardia. With vagal manuevers, atrial activity became apparent with what looked like alternating beats of SR with 1st degree AVB and beats with retrograde p-wave conduction. Multiple modified vagal manuevers were performed but we were unable to break this presumed SVT. 6mg  of adenosine was administered peripherally with brief AV nodal block followed by slow return of SR with 1st degree AV block. Patient was admitted for further evaluation and management and EP was consulted the next day.  Hospital Course     Consultants: EP  Persistent Atrial Fibrillation/Flutter and SVT Patient admitted following SVT episode as stated above. Home Coreg dose was increased to 12.5mg  twice daily on admission. However, the next morning, she noted to be in likely atrial fibrillation (possible flutter) with rates as high as the 140's to 160's. She was restarted on Flecainide 100mg  twice daily. Echo on 01/23/2019 showed LVEF of 55-60% with hypokinesis of the basal septum consistent with post-operative state and elevated LVEDP. Mitral valve was stable. Rates remained uncontrolled so patient underwent DCCV on 01/24/2019 which was initially successful but unfortunately she reverted back to atrial flutter later that day. Therefore, decision was made to start Tikosyn after Flecainide washout. While waiting for Flecainide washout, she was started on IV Diltiazem and converted back to normal sinus rhythm with this. Tikosyn started on 01/25/2019 and then was transitioned to PO Cardizem CD 120mg  daily. Renal function and electrolytes were followed during the hospitalization. Potassium and Magnesium were replaced as needed per protocol. Her QTc remained stable. She was monitored until discharge on telemetry which demonstrated sinus rhythm.  - Continue Tikosyn  567mcg twice daily. 1-week sample was given prior to discharge. Also confirmed that choice pharmacy had it in stock. - Continue increased dose of Coreg 12.5mg  twice daily. - Continue Cardizem CD 120mg  daily. - Continue chronic anticoagulation with home dose of Coumadin (2.5mg  daily except 5mg  on Monday). Personally discussed dosing with pharmacy prior to discharge. She has visit with Coumadin clinic scheduled for 02/02/2019. - Will arrange for close follow-up in A.Fib clinic in 1 week. Will need repeat BMET and Magnesium checked at that time.  S/p Recent MVR/MAZE Repeat Echo this admission showed stable MVR with no stenosis or regurgitation. Dr. Roxy Manns did stop by to see patient early during admission and felt she was doing well following recent surgery. Recommended routine follow-up in 2 months. Continue Aspirin and Coumadin.  Hypertension BP has been mostly well controlled. Previously on Losartan but this was discontinued prior to admission. Continue Coreg and Diltiazem as above.  Nausea Patient reported some nausea throughout admission. LFTs, lipase, and amylase all normal.  Of note, reviewed home medications for potential drug interaction with Tikosyn. Patient takes Doxepin to help her sleep. Per UpToDate, QTc prolonging agents (such as Tikosyn) can enhance the QTc prolonging effects of Doxepin. Recommend stopping Doxepin for now and discussing with PCP about other medication options.  Patient seen and examined today by Dr. Audie Box and determined to be stable for discharge. On the day of discharge, they were examined by Dr who considered them stable for discharge to home. Will arrange follow-up in Atrial Fibrillation clinic in 1 week. Medications as below.  _____________  Discharge Vitals Blood pressure (!) 130/53, pulse 84, temperature 98 F (36.7 C), temperature source Oral, resp.  rate 18, height 5' (1.524 m), weight 69.2 kg, SpO2 100 %.  Filed Weights   01/23/19 0200  Weight: 69.2 kg    General: 74 y.o. female resting comfortably in no acute distress. HEENT: Normocephalic and atraumatic.  Neck: Supple.  Heart: RRR. Distinct S1 and S2. No murmurs, gallops, or rubs. Radial pulses 2+ and equal bilaterally. Lungs: No increased work of breathing. Clear to ausculation bilaterally. No wheezes, rhonchi, or rales.  Abdomen: Soft, non-distended, and non-tender to palpation. Bowel sounds present.  Extremities: No lower extremiety edema.    Skin: Warm and dry. Neuro: Alert and oriented x3. No focal deficits. Psych: Normal affect. Responds appropriately.   Labs & Radiologic Studies    CBC No results for input(s): WBC, NEUTROABS, HGB, HCT, MCV, PLT in the last 72 hours. Basic Metabolic Panel Recent Labs    01/26/19 0242 01/27/19 0314 01/28/19 0706  NA 140 141 141  K 3.8 4.0 3.9  CL 107 107 104  CO2 29 27 29   GLUCOSE 97 90 95  BUN 11 8 5*  CREATININE 0.69 0.64 0.64  CALCIUM 8.3* 8.5* 8.7*  MG 2.2 1.9  --    Liver Function Tests Recent Labs    01/28/19 0706  AST 16  ALT 13  ALKPHOS 73  BILITOT 0.8  PROT 5.7*  ALBUMIN 3.1*   Recent Labs    01/28/19 0706  LIPASE 22  AMYLASE 29   High Sensitivity Troponin:   Recent Labs  Lab 01/22/19 2131 01/22/19 2329  TROPONINIHS 27* 21*    BNP Invalid input(s): POCBNP D-Dimer No results for input(s): DDIMER in the last 72 hours. Hemoglobin A1C No results for input(s): HGBA1C in the last 72 hours. Fasting Lipid Panel No results for input(s): CHOL, HDL, LDLCALC, TRIG, CHOLHDL, LDLDIRECT in the last 72 hours. Thyroid Function Tests No results for input(s): TSH, T4TOTAL, T3FREE, THYROIDAB in the last 72 hours.  Invalid input(s): FREET3 _____________  DG Chest 2 View  Result Date: 01/08/2019 CLINICAL DATA:  S/p surgery for atrial fibrillation. EXAM: CHEST - 2 VIEW COMPARISON:  01/01/2019 FINDINGS: Mitral valve replacement and atrial clip appear the same. Left chest is clear. Right chest again shows some pleural  fluid with dependent atelectasis. No worsening or new finding. IMPRESSION: No significant change since 1 week ago. Persistent right effusion and atelectasis. Electronically Signed   By: Nelson Chimes M.D.   On: 01/08/2019 13:50   DG Chest 2 View  Result Date: 01/01/2019 CLINICAL DATA:  s/p minimally invasive maze operation for afib. EXAM: CHEST - 2 VIEW COMPARISON:  Chest x-ray 12/18/2018. FINDINGS: Cardiac valve replacement. Left atrial appendage clip in stable position. Stable cardiomegaly. Bilateral pulmonary venous congestion and mild interstitial prominence. Small right pleural effusion. Findings consistent CHF. Similar findings noted on prior exam. No evidence of significant pneumothorax noted on today's exam. IMPRESSION: Cardiac valve replacement. Left atrial appendage clip noted stable position. Stable cardiomegaly. Bilateral pulmonary venous congestion and mild interstitial prominence. Small right pleural effusion. Findings consistent CHF. Similar findings noted on prior exam. 2.  No evidence of significant pneumothorax noted on today's exam. Electronically Signed   By: [place]   On: 01/01/2019 14:01   CT HEAD WO CONTRAST  Result Date: 01/01/2019 CLINICAL DATA:  Head trauma with left forehead bruising. EXAM: CT HEAD WITHOUT CONTRAST TECHNIQUE: Contiguous axial images were obtained from the base of the skull through the vertex without intravenous contrast. COMPARISON:  10/15/2018 FINDINGS: Brain: There is no evidence of acute infarct, intracranial  hemorrhage, mass, midline shift, or extra-axial fluid collection. The ventricles and sulci are within normal limits for age. Vascular: Calcified atherosclerosis at the skull base. No hyperdense vessel. Skull: No fracture or focal osseous lesion. Sinuses/Orbits: Paranasal sinuses and mastoid air cells are clear. Bilateral cataract extraction. Other: None. IMPRESSION: No evidence of acute intracranial abnormality. Electronically Signed   By: Logan Bores M.D.   On: 01/01/2019 16:38   DG Chest Port 1 View  Result Date: 01/22/2019 CLINICAL DATA:  Tachycardia, chest tightness, history of AFib EXAM: PORTABLE CHEST 1 VIEW COMPARISON:  01/08/2019 FINDINGS: Cardiomegaly with mitral valve prosthesis and left atrial appendage clip. Minimal linear scarring or atelectasis of the left midlung. The visualized skeletal structures are unremarkable. IMPRESSION: 1.  No acute abnormality of the lungs in AP portable projection. 2. Cardiomegaly with mitral valve prosthesis and left atrial appendage clip. Electronically Signed   By: Eddie Candle M.D.   On: 01/22/2019 21:59   DG Humerus Right  Result Date: 01/01/2019 CLINICAL DATA:  Door fell on patient. Right arm pain. EXAM: RIGHT HUMERUS - 2+ VIEW COMPARISON:  None. FINDINGS: Acute bone or soft tissue abnormalities are present. No radiopaque foreign body is present. Mild degenerative changes are noted at the [facility] joint. IMPRESSION: 1. No acute or focal abnormality. 2. No radiopaque foreign body. Electronically Signed   By: San Morelle M.D.   On: 01/01/2019 15:23   ECHOCARDIOGRAM COMPLETE  Result Date: 01/23/2019   ECHOCARDIOGRAM REPORT   Patient Name:   Mary Farrell Date of Exam: 01/23/2019 Medical Rec #:  [id number]    Height:       60.0 in Accession #:    [id number]   Weight:       152.6 lb Date of Birth:  1945/08/04   BSA:          1.66 m Patient Age:    38 years     BP:           136/81 mmHg Patient Gender: F            HR:           107 bpm. Exam Location:  Inpatient Procedure: 2D Echo, Color Doppler and Cardiac Doppler Indications:    R00.2 Palpitations  History:        Patient has prior history of Echocardiogram examinations, most                 recent 12/13/2018. CHF, Arrythmias:Atrial Fibrillation; Risk                 Factors:Sleep Apnea and Hypertension. 12/13/18 Mitral Valve                 replaced with 75mm Magna Mitral Ease and LAA clipped.  Sonographer:    Raquel Sarna Senior RDCS Referring Phys:  [id number] [place]  1. Left ventricular ejection fraction, by visual estimation, is 55 to 60%. The left ventricle has normal function. Left ventricular septal wall thickness was mildly increased. Moderately increased left ventricular posterior wall thickness. There is mildly increased left ventricular hypertrophy.  2. Left ventricular diastolic parameters are indeterminate.  3. The left ventricle demonstrates regional wall motion abnormalities.  4. Elevated LVEDP. Hypokinesis of the basal septum consistent with post-operative state.  5. Global right ventricle has normal systolic function.The right ventricular size is normal. No increase in right ventricular wall thickness.  6. Left atrial size was severely dilated.  7. Right atrial size was normal.  8. The mitral valve has been repaired/replaced. No evidence of mitral valve regurgitation. No evidence of mitral stenosis.  9. The tricuspid valve is normal in structure. 10. The aortic valve is tricuspid. Aortic valve regurgitation is mild. No evidence of aortic valve sclerosis or stenosis. 11. The pulmonic valve was normal in structure. Pulmonic valve regurgitation is not visualized. 12. Normal pulmonary artery systolic pressure. 13. The inferior vena cava is normal in size with <50% respiratory variability, suggesting right atrial pressure of 8 mmHg. In comparison to the previous echocardiogram(s): Bioprosthetic mitral valve now present and functioning properly. Mean gradient 5 mmHg. FINDINGS  Left Ventricle: Left ventricular ejection fraction, by visual estimation, is 55 to 60%. The left ventricle has normal function. The left ventricle demonstrates regional wall motion abnormalities. Moderately increased left ventricular posterior wall thickness. There is mildly increased left ventricular hypertrophy. Left ventricular diastolic parameters are indeterminate. Normal left atrial pressure. Elevated LVEDP. Hypokinesis of the basal septum consistent with  post-operative state. Right Ventricle: The right ventricular size is normal. No increase in right ventricular wall thickness. Global RV systolic function is has normal systolic function. The tricuspid regurgitant velocity is 1.85 m/s, and with an assumed right atrial pressure  of 8 mmHg, the estimated right ventricular systolic pressure is normal at 21.7 mmHg. Left Atrium: Left atrial size was severely dilated. Right Atrium: Right atrial size was normal in size Pericardium: There is no evidence of pericardial effusion. Mitral Valve: The mitral valve has been repaired/replaced. No evidence of mitral valve regurgitation. No evidence of mitral valve stenosis by observation. MV peak gradient, 10.1 mmHg. Tricuspid Valve: The tricuspid valve is normal in structure. Tricuspid valve regurgitation is mild. Aortic Valve: The aortic valve is tricuspid. Aortic valve regurgitation is mild. The aortic valve is structurally normal, with no evidence of sclerosis or stenosis. Pulmonic Valve: The pulmonic valve was normal in structure. Pulmonic valve regurgitation is not visualized. Pulmonic regurgitation is not visualized. Aorta: The aortic root, ascending aorta and aortic arch are all structurally normal, with no evidence of dilitation or obstruction. Venous: The inferior vena cava is normal in size with less than 50% respiratory variability, suggesting right atrial pressure of 8 mmHg. IAS/Shunts: No atrial level shunt detected by color flow Doppler. There is no evidence of a patent foramen ovale. No ventricular septal defect is seen or detected. There is no evidence of an atrial septal defect.  LEFT VENTRICLE PLAX 2D LVIDd:         3.40 cm  Diastology LVIDs:         2.40 cm  LV e' lateral:   7.94 cm/s LV PW:         1.40 cm  LV E/e' lateral: 16.2 LV IVS:        1.20 cm  LV e' medial:    6.74 cm/s LVOT diam:     2.00 cm  LV E/e' medial:  19.1 LV SV:         27 ml LV SV Index:   15.70 LVOT Area:     3.14 cm  RIGHT VENTRICLE RV S  prime:     6.20 cm/s TAPSE (M-mode): 1.1 cm LEFT ATRIUM             Index       RIGHT ATRIUM           Index LA diam:        4.50 cm 2.70 cm/m  RA Area:     12.30 cm LA Vol (  A2C):   80.2 ml 48.21 ml/m RA Volume:   20.70 ml  12.44 ml/m LA Vol (A4C):   61.9 ml 37.21 ml/m LA Biplane Vol: 74.3 ml 44.66 ml/m  AORTIC VALVE LVOT Vmax:   130.00 cm/s LVOT Vmean:  92.200 cm/s LVOT VTI:    0.243 m  AORTA Ao Root diam: 2.90 cm Ao Asc diam:  3.30 cm MITRAL VALVE                         TRICUSPID VALVE MV Area (PHT): 4.68 cm              TR Peak grad:   13.7 mmHg MV Peak grad:  10.1 mmHg             TR Vmax:        185.00 cm/s MV Mean grad:  5.0 mmHg MV Vmax:       1.59 m/s              SHUNTS MV Vmean:      102.0 cm/s            Systemic VTI:  0.24 m MV VTI:        0.24 m                Systemic Diam: 2.00 cm MV PHT:        46.98 msec MV Decel Time: 162 msec MV E velocity: 129.00 cm/s 103 cm/s MV A velocity: 37.20 cm/s  70.3 cm/s MV E/A ratio:  3.47        1.5  Skeet Latch MD Electronically signed by Skeet Latch MD Signature Date/Time: 01/23/2019/12:17:40 PM    Final    Disposition   Patient is being discharged home today in good condition.  Follow-up Plans & Appointments    Follow-up Information    [name], MD Follow up.   Specialty: Cardiology Why: Our office will call you on Monday to schedule a follow-up in our [place] [facility]. However, if you do not hear from [place] by mid-day on Monday, please call use to schedule this appointment. Contact information: [address] [place] [address] [phone]        [facility] Follow up.   Specialty: Cardiology Why: Your visit with our Coumadin clinic has been moved up to Friday 02/02/2019 at [place] information: [place] [phone]         Discharge Instructions    Diet - low sodium heart healthy   Complete by: As directed     Increase activity slowly   Complete by: As directed       Discharge Medications   Allergies as of 01/28/2019      Reactions   Amoxicillin Diarrhea   Has patient had a PCN reaction causing immediate rash, facial/tongue/throat swelling, SOB or lightheadedness with hypotension: no Has patient had a PCN reaction causing severe rash involving mucus membranes or skin necrosis: no Has patient had a PCN reaction that required hospitalization: no Has patient had a PCN reaction occurring within the last 10 years: no If all of the above answers are "NO", then may proceed with Cephalosporin use.   Metoprolol Tartrate Other (See Comments)   Mouth sores "mouth tastes like mold"   Oxaprozin Nausea Only      Medication List    STOP taking these medications   doxepin 10 MG capsule Commonly  known as: SINEQUAN   losartan 25 MG tablet Commonly known as: COZAAR     TAKE these medications   acetaminophen 500 MG tablet Commonly known as: TYLENOL Take 1,000 mg by mouth every 6 (six) hours as needed for moderate pain or headache.   ALPRAZolam 0.25 MG tablet Commonly known as: XANAX Take 0.125 mg by mouth 2 (two) times daily as needed for anxiety.   aspirin 81 MG EC tablet Take 1 tablet (81 mg total) by mouth daily.   carvedilol 12.5 MG tablet Commonly known as: COREG Take 1 tablet (12.5 mg total) by mouth 2 (two) times daily with a meal. What changed:   medication strength  how much to take  when to take this   cetirizine 10 MG tablet Commonly known as: ZYRTEC Take 10 mg by mouth at bedtime.   conjugated estrogens vaginal cream Commonly known as: PREMARIN Place 1 Applicatorful vaginally daily as needed (irritation).   diltiazem 120 MG 24 hr capsule Commonly known as: CARDIZEM CD Take 1 capsule (120 mg total) by mouth daily.   dofetilide 500 MCG capsule Commonly known as: TIKOSYN Take 1 capsule (500 mcg total) by mouth 2 (two) times daily.   dorzolamide-timolol 22.3-6.8  MG/ML ophthalmic solution Commonly known as: COSOPT Place 1 drop into both eyes daily.   famotidine 20 MG tablet Commonly known as: PEPCID Take 20 mg by mouth at bedtime.   furosemide 40 MG tablet Commonly known as: LASIX Take 1 tablet (40 mg total) by mouth daily as needed (for worsening swelling or weight gain of 3lbs or more). What changed:   when to take this  reasons to take this  additional instructions   gabapentin 300 MG capsule Commonly known as: NEURONTIN Take 300 mg by mouth 3 (three) times daily.   lactobacillus acidophilus Tabs tablet Take 1 tablet by mouth daily.   latanoprost 0.005 % ophthalmic solution Commonly known as: XALATAN Place 1 drop into both eyes at bedtime.   multivitamin with minerals Tabs tablet Take 1 tablet by mouth daily.   NON FORMULARY Apply 1 application topically 2 (two) times daily as needed (for eczema). Traimcinolone/[employer] Moist Cream   omeprazole 40 MG capsule Commonly known as: PRILOSEC Take 40 mg by mouth daily.   oxybutynin 10 MG 24 hr tablet Commonly known as: DITROPAN-XL Take 10 mg by mouth daily.   polyethylene glycol 17 g packet Commonly known as: MIRALAX / GLYCOLAX Take 17 g by mouth daily as needed for moderate constipation.   potassium chloride 10 MEQ tablet Commonly known as: KLOR-CON Take 1 tablet (10 mEq total) by mouth daily as needed (if you need to take Lasix). What changed:   when to take this  reasons to take this   [place] into the lungs at bedtime. CPAP   Vitamin D 50 MCG (2000 UT) Caps Take 2,000 Units by mouth daily.   warfarin 2.5 MG tablet Commonly known as: Coumadin Take as directed. If you are unsure how to take this medication, talk to your nurse or doctor. Original instructions: Take 1 tablet (2.5 mg total) by mouth daily at 6 PM. Take 1 tablet by mouth daily or as directed by coumadin clinic What changed:   when to take this  additional instructions           Outstanding Labs/Studies   Will need repeat BMET and Magnesium at follow-up visit in 1 week.   Duration of Discharge Encounter   Greater than 30 minutes including physician  time.  Liane Comber, PA-C 01/28/2019, 3:23 PM

## 2019-01-28 NOTE — Care Management (Signed)
Per nurse patient has  A filled prescription of tykosin to go home with. No other CM needs identified

## 2019-01-28 NOTE — Progress Notes (Signed)
[place] for warfarin Indication: afib and bioMVR  Allergies  Allergen Reactions  . Amoxicillin Diarrhea    Has patient had a PCN reaction causing immediate rash, facial/tongue/throat swelling, SOB or lightheadedness with hypotension: no Has patient had a PCN reaction causing severe rash involving mucus membranes or skin necrosis: no Has patient had a PCN reaction that required hospitalization: no Has patient had a PCN reaction occurring within the last 10 years: no If all of the above answers are "NO", then may proceed with Cephalosporin use.   . Metoprolol Tartrate Other (See Comments)    Mouth sores "mouth tastes like mold"  . Oxaprozin Nausea Only    Patient Measurements: Height: 5' (152.4 cm) Weight: 152 lb 8.9 oz (69.2 kg) IBW/kg (Calculated) : 45.5   Vital Signs: Temp: 98 F (36.7 C) (01/03 1101) Temp Source: Oral (01/03 1101) BP: 130/53 (01/03 1101) Pulse Rate: 84 (01/03 0321)  Labs: Recent Labs    01/26/19 0242 01/27/19 0211 01/27/19 0314 01/28/19 0706  LABPROT 30.4* 30.6*  --  26.9*  INR 2.9* 2.9*  --  2.5*  CREATININE 0.69  --  0.64 0.64    Estimated Creatinine Clearance: 52.8 mL/min (by C-G formula based on SCr of 0.64 mg/dL).   Medical History: Past Medical History:  Diagnosis Date  . Anxiety 06/29/2012  . Aortic regurgitation 08/13/2015  . Arthritis    "hands, wrists, back, feet, toes" (06/24/2017)  . Atrial flutter ([place]) 09/18/2015  . Atypical chest pain 05/13/2016  . Chest pain    remote cath in 1996 with NORMAL coronaries noted  . CHF (congestive heart failure) ([place])   . Dyspnea 10/30/2010  . Essential hypertension 09/05/2013  . Exogenous obesity    history of   . GERD (gastroesophageal reflux disease)   . Glaucoma, both eyes   . Headache, migraine    "stopped in my 50's" (09/18/2015)  . History of cardiovascular stress test 2007   showing no ischemia  . Hypertension   . Mitral stenosis and aortic  insufficiency 06/23/2010  . Mitral stenosis with insufficiency   . Mitral stenosis with regurgitation   . Murmur    of mitral stenosis and moderate aortic insufficiency      . Nausea 05/01/2018  . OSA on CPAP   . Palpitations    occasional  . Paroxysmal A-fib ([place]) 06/24/2017  . Pericardial effusion 09/18/2015  . Persistent atrial fibrillation ([place]) 03/10/2017  . Personal history of rheumatic heart disease   . S/P minimally-invasive maze operation for atrial fibrillation 12/13/2018   Complete bilateral atrial lesion set using cryothermy and bipolar radiofrequency ablation with clipping of LA appendage via right mini-thoracotomy approach  . S/P minimally-invasive mitral valve replacement with bioprosthetic valve 12/13/2018   31 mm [facility] Mitral stented bovine pericardial tissue valve  . SBE (subacute bacterial endocarditis)    prophylaxis, patient unaware  . Sliding hiatal hernia   . Vertigo 05/01/2018    Medications:  Medications Prior to Admission  Medication Sig Dispense Refill Last Dose  . acetaminophen (TYLENOL) 500 MG tablet Take 1,000 mg by mouth every 6 (six) hours as needed for moderate pain or headache.    Past Month at Unknown time  . ALPRAZolam (XANAX) 0.25 MG tablet Take 0.125 mg by mouth 2 (two) times daily as needed for anxiety.    Past Week at Unknown time  . aspirin EC 81 MG EC tablet Take 1 tablet (81 mg total) by mouth daily.   01/22/2019  at Unknown time  . carvedilol (COREG) 6.25 MG tablet Take 1 tablet (6.25 mg total) by mouth 2 (two) times daily. 30 tablet 6 01/22/2019 at 1800  . cetirizine (ZYRTEC) 10 MG tablet Take 10 mg by mouth at bedtime.    01/21/2019 at Unknown time  . Cholecalciferol (VITAMIN D) 2000 UNITS CAPS Take 2,000 Units by mouth daily.     01/22/2019 at Unknown time  . conjugated estrogens (PREMARIN) vaginal cream Place 1 Applicatorful vaginally daily as needed (irritation).    Past Month at Unknown time  . dorzolamide-timolol (COSOPT) 22.3-6.8  MG/ML ophthalmic solution Place 1 drop into both eyes daily.   01/22/2019 at Unknown time  . doxepin (SINEQUAN) 10 MG capsule Take 10-20 mg by mouth at bedtime as needed (sleep).    Past Month at Unknown time  . famotidine (PEPCID) 20 MG tablet Take 20 mg by mouth at bedtime.   01/22/2019 at Unknown time  . furosemide (LASIX) 40 MG tablet Take 1 tablet (40 mg total) by mouth daily. Take daily for 2 weeks, then take 1 tablet prn on days you notice weight gain of 3 lbs or more 30 tablet 1 01/22/2019 at Unknown time  . gabapentin (NEURONTIN) 300 MG capsule Take 300 mg by mouth 3 (three) times daily.    01/22/2019 at Unknown time  . lactobacillus acidophilus (BACID) TABS tablet Take 1 tablet by mouth daily.    01/22/2019 at Unknown time  . latanoprost (XALATAN) 0.005 % ophthalmic solution Place 1 drop into both eyes at bedtime.   6 01/21/2019 at Unknown time  . losartan (COZAAR) 25 MG tablet Take 25 mg by mouth daily.   01/22/2019 at Unknown time  . Multiple Vitamin (MULTIVITAMIN WITH MINERALS) TABS tablet Take 1 tablet by mouth daily.   01/22/2019 at Unknown time  . NON FORMULARY Apply 1 application topically 2 (two) times daily as needed (for eczema). Traimcinolone/[facility] Moist Cream   Past Month at Unknown time  . omeprazole (PRILOSEC) 40 MG capsule Take 40 mg by mouth daily.    01/22/2019 at Unknown time  . oxybutynin (DITROPAN-XL) 10 MG 24 hr tablet Take 10 mg by mouth daily.    01/22/2019 at Unknown time  . polyethylene glycol (MIRALAX / GLYCOLAX) 17 g packet Take 17 g by mouth daily as needed for moderate constipation.    Past Month at Unknown time  . potassium chloride (KLOR-CON) 10 MEQ tablet Take 1 tablet (10 mEq total) by mouth daily. 30 tablet 2 01/22/2019 at Unknown time  . warfarin (COUMADIN) 2.5 MG tablet Take 1 tablet (2.5 mg total) by mouth daily at 6 PM. Take 1 tablet by mouth daily or as directed by coumadin clinic (Patient taking differently: Take 2.5 mg by mouth See admin instructions.  Take 2 tablets on Monday then take 1 tablet all other days) 100 tablet 1 01/22/2019 at 1800  . PRESCRIPTION MEDICATION Inhale into the lungs at bedtime. CPAP       Assessment: 74 yo female with SVT on warfarin PTA for afib (also with minimally invasive bioprosthetic MVR and MAZE in 11/2018). Pharmacy consulted to dose warfarin. She is now noted s/p DCCV and initiating on Tikosyn. INR remains therapeutic at 2.5. No active bleed issues documented.   Home dose: 2.5mg /d except take 5mg  on Mon (last clinic visit 12/9; INR was 2.9)  Patient is being discharged today per MD  Goal of Therapy:  INR 2-3 Monitor platelets by anticoagulation protocol: Yes   Plan:   Continue  warfarin 2.5 mg daily, except 5 mg on Monday at discharge Monitor daily INR, CBC, s/sx bleeding  Vertis Kelch, PharmD, [facility] PGY2 Cardiology Pharmacy Resident Phone [phone] 01/28/2019       1:05 PM  Please check [email or web address] for unit-specific pharmacist phone numbers

## 2019-01-28 NOTE — Progress Notes (Signed)
Pharmacy: Dofetilide (Tikosyn) - Follow Up Assessment and Electrolyte Replacement  Pharmacy consulted to assist in monitoring and replacing electrolytes in this 74 y.o. female admitted on 01/22/2019 undergoing dofetilide initiation. First dofetilide dose: 01/24/19 @ 8pm  Labs:    Component Value Date/Time   K 3.9 01/28/2019 0706   MG 1.9 01/27/2019 0314     Plan: Potassium: K 3.8-3.9:  Give KCl 40 mEq po x1   Magnesium: No labs today  Thank you for allowing pharmacy to participate in this patient's care   Vertis Kelch, PharmD, [facility] PGY2 Cardiology Pharmacy Resident Phone [phone] 01/28/2019       1:07 PM  Please check [email or web address] for unit-specific pharmacist phone numbers

## 2019-01-28 NOTE — Discharge Instructions (Signed)
START Tikosyn 551mcg twice daily. START Diltiazem (Cardizem) 120mg  daily. INCREASE Carvedilol (Coreg) to 12.5mg  twice daily.  OK to continue to take Lasix as needed. If you need Lasix, make sure you take a potassium pill with that.  I have stopped your Losartan as you said you no longer take that. Please continue to monitor your BP a home.  I have stopped your Doxepin for now. Recommend you talk with your primary care provider about other medications to help you sleep that do not prolong your QTc.   Our office will call you on Monday to schedule a follow-up visit in our Atrial Fibrillation clinic in about 1 week. However, if you do not hear from [place] by mid-day on Monday, please call our office to schedule this appointment.  You appointment in our Coumadin Clinic has been moved up to Friday 02/02/2019 at 8:00am.

## 2019-01-30 ENCOUNTER — Other Ambulatory Visit: Payer: Self-pay | Admitting: Physician Assistant

## 2019-02-02 ENCOUNTER — Ambulatory Visit (INDEPENDENT_AMBULATORY_CARE_PROVIDER_SITE_OTHER): Payer: Medicare Other | Admitting: Pharmacist Clinician (PhC)/ Clinical Pharmacy Specialist

## 2019-02-02 ENCOUNTER — Other Ambulatory Visit: Payer: Self-pay

## 2019-02-02 DIAGNOSIS — I480 Paroxysmal atrial fibrillation: Secondary | ICD-10-CM | POA: Diagnosis not present

## 2019-02-02 DIAGNOSIS — Z7901 Long term (current) use of anticoagulants: Secondary | ICD-10-CM | POA: Diagnosis not present

## 2019-02-02 LAB — POCT INR: INR: 2.4 (ref 2.0–3.0)

## 2019-02-05 ENCOUNTER — Ambulatory Visit (HOSPITAL_COMMUNITY)
Admission: RE | Admit: 2019-02-05 | Discharge: 2019-02-05 | Disposition: A | Discharge status: Home / Self Care | Payer: Medicare Other | Source: Ambulatory Visit | Attending: Nurse Practitioner | Admitting: Nurse Practitioner

## 2019-02-05 ENCOUNTER — Encounter (HOSPITAL_COMMUNITY): Payer: Self-pay | Admitting: Nurse Practitioner

## 2019-02-05 ENCOUNTER — Other Ambulatory Visit: Payer: Self-pay

## 2019-02-05 VITALS — BP 134/58 | HR 55 | Ht 60.0 in | Wt 151.2 lb

## 2019-02-05 DIAGNOSIS — I480 Paroxysmal atrial fibrillation: Secondary | ICD-10-CM | POA: Diagnosis not present

## 2019-02-05 DIAGNOSIS — Z7982 Long term (current) use of aspirin: Secondary | ICD-10-CM | POA: Diagnosis not present

## 2019-02-05 DIAGNOSIS — Z952 Presence of prosthetic heart valve: Secondary | ICD-10-CM | POA: Insufficient documentation

## 2019-02-05 DIAGNOSIS — F419 Anxiety disorder, unspecified: Secondary | ICD-10-CM | POA: Insufficient documentation

## 2019-02-05 DIAGNOSIS — I4819 Other persistent atrial fibrillation: Secondary | ICD-10-CM | POA: Insufficient documentation

## 2019-02-05 DIAGNOSIS — Z79899 Other long term (current) drug therapy: Secondary | ICD-10-CM | POA: Insufficient documentation

## 2019-02-05 DIAGNOSIS — Z7901 Long term (current) use of anticoagulants: Secondary | ICD-10-CM | POA: Insufficient documentation

## 2019-02-05 DIAGNOSIS — I110 Hypertensive heart disease with heart failure: Secondary | ICD-10-CM | POA: Insufficient documentation

## 2019-02-05 DIAGNOSIS — K219 Gastro-esophageal reflux disease without esophagitis: Secondary | ICD-10-CM | POA: Diagnosis not present

## 2019-02-05 DIAGNOSIS — G43909 Migraine, unspecified, not intractable, without status migrainosus: Secondary | ICD-10-CM | POA: Insufficient documentation

## 2019-02-05 DIAGNOSIS — D6869 Other thrombophilia: Secondary | ICD-10-CM

## 2019-02-05 DIAGNOSIS — I4892 Unspecified atrial flutter: Secondary | ICD-10-CM | POA: Diagnosis not present

## 2019-02-05 DIAGNOSIS — I509 Heart failure, unspecified: Secondary | ICD-10-CM | POA: Insufficient documentation

## 2019-02-05 LAB — BASIC METABOLIC PANEL
Anion gap: 9 (ref 5–15)
BUN: 10 mg/dL (ref 8–23)
CO2: 23 mmol/L (ref 22–32)
Calcium: 8.8 mg/dL — ABNORMAL LOW (ref 8.9–10.3)
Chloride: 106 mmol/L (ref 98–111)
Creatinine, Ser: 0.69 mg/dL (ref 0.44–1.00)
GFR calc Af Amer: >60 mL/min (ref >60)
GFR calc non Af Amer: >60 mL/min (ref >60)
Glucose, Bld: 96 mg/dL (ref 70–99)
Potassium: 4.1 mmol/L (ref 3.5–5.1)
Sodium: 138 mmol/L (ref 135–145)

## 2019-02-05 LAB — MAGNESIUM: Magnesium: 2.1 mg/dL (ref 1.7–2.4)

## 2019-02-05 NOTE — Progress Notes (Addendum)
Primary Care Physician: Lawerance Cruel, MD Referring Physician: Dr. Gillian Scarce f/u   Mary Farrell is a 74 y.o. female with a h/o  persistent atrial fibrillation, mitral valve replacement with Maze procedure 11/2018 who was admitted for recurrent atrial arrhythmias 01/22/19 to 01/28/19.  She was admitted for persistent  atrial fibrillation/flutter.    She underwent cardioversion and was started on flecainide therapy.  She had recurrent atrial flutter despite flecainide therapy, and underwent Tikosyn loading while in-house.  She was maintained on dofetilide 500 mcg twice daily.  Her QTC remained around 490-500 ms.  The electrophysiology attending, Jolyn Nap, MD, reviewed her EKG prior to discharge and agreed with the dose of 500 mcg twice daily.   Now in the clinic she is staying in SR at 55 bpm. qtc is stable at 476 ms. No concerns voiced today. She is feeling improved. CHA2DS2VASc of at least 4 and continues on wafarin.   Today, she denies symptoms of palpitations, chest pain, shortness of breath, orthopnea, PND, lower extremity edema, dizziness, presyncope, syncope, or neurologic sequela. The patient is tolerating medications without difficulties and is otherwise without complaint today.   Past Medical History:  Diagnosis Date  . Anxiety 06/29/2012  . Aortic regurgitation 08/13/2015  . Arthritis    "hands, wrists, back, feet, toes" (06/24/2017)  . Atrial flutter ([place]) 09/18/2015  . Atypical chest pain 05/13/2016  . Chest pain    remote cath in 1996 with NORMAL coronaries noted  . CHF (congestive heart failure) ([place])   . Dyspnea 10/30/2010  . Essential hypertension 09/05/2013  . Exogenous obesity    history of   . GERD (gastroesophageal reflux disease)   . Glaucoma, both eyes   . Headache, migraine    "stopped in my 50's" (09/18/2015)  . History of cardiovascular stress test 2007   showing no ischemia  . Hypertension   . Mitral stenosis and aortic insufficiency 06/23/2010  . Mitral  stenosis with insufficiency   . Mitral stenosis with regurgitation   . Murmur    of mitral stenosis and moderate aortic insufficiency      . Nausea 05/01/2018  . OSA on CPAP   . Palpitations    occasional  . Paroxysmal A-fib ([place]) 06/24/2017  . Pericardial effusion 09/18/2015  . Persistent atrial fibrillation ([place]) 03/10/2017  . Personal history of rheumatic heart disease   . S/P minimally-invasive maze operation for atrial fibrillation 12/13/2018   Complete bilateral atrial lesion set using cryothermy and bipolar radiofrequency ablation with clipping of LA appendage via right mini-thoracotomy approach  . S/P minimally-invasive mitral valve replacement with bioprosthetic valve 12/13/2018   31 mm [facility] Mitral stented bovine pericardial tissue valve  . SBE (subacute bacterial endocarditis)    prophylaxis, patient unaware  . Sliding hiatal hernia   . Vertigo 05/01/2018   Past Surgical History:  Procedure Laterality Date  . ABDOMINAL HYSTERECTOMY  1975  . APPENDECTOMY  1977  . ATRIAL FIBRILLATION ABLATION N/A 03/10/2017   Procedure: ATRIAL FIBRILLATION ABLATION;  Surgeon: [name], MD;  Location: [place] CV LAB;  Service: Cardiovascular;  Laterality: N/A;  . ATRIAL FIBRILLATION ABLATION  06/24/2017  . ATRIAL FIBRILLATION ABLATION N/A 06/24/2017   Procedure: ATRIAL FIBRILLATION ABLATION;  Surgeon: [name], MD;  Location: [place] CV LAB;  Service: Cardiovascular;  Laterality: N/A;  . BUNIONECTOMY Right 2000s  . CARDIAC CATHETERIZATION  01/13/1995   normal coronary anatomy and mild mitral stenosis and mild pulmonary hypertension  . CARDIOVERSION N/A  09/22/2015   Procedure: CARDIOVERSION;  Surgeon: Jerline Pain, MD;  Location: [place];  Service: Cardiovascular;  Laterality: N/A;  . CARDIOVERSION N/A 10/25/2016   Procedure: CARDIOVERSION;  Surgeon: Fay Records, MD;  Location: [place];  Service: Cardiovascular;  Laterality: N/A;  . CARDIOVERSION  N/A 04/15/2017   Procedure: CARDIOVERSION;  Surgeon: Josue Hector, MD;  Location: [facility] ENDOSCOPY;  Service: Cardiovascular;  Laterality: N/A;  . CARDIOVERSION N/A 05/16/2017   Procedure: CARDIOVERSION;  Surgeon: Pixie Casino, MD;  Location: [facility] ENDOSCOPY;  Service: Cardiovascular;  Laterality: N/A;  . CARDIOVERSION N/A 01/24/2019   Procedure: CARDIOVERSION;  Surgeon: Pixie Casino, MD;  Location: [facility] ENDOSCOPY;  Service: Cardiovascular;  Laterality: N/A;  . CATARACT EXTRACTION W/ INTRAOCULAR LENS  IMPLANT, BILATERAL Bilateral 2013  . COLONOSCOPY  2013  . EYE SURGERY Bilateral    cataracts  . LAPAROSCOPIC CHOLECYSTECTOMY  1997  . MINIMALLY INVASIVE MAZE PROCEDURE N/A 12/13/2018   Procedure: MINIMALLY INVASIVE MAZE PROCEDURE using a 45 MM AtriClip.;  Surgeon: Rexene Alberts, MD;  Location: [place];  Service: Open Heart Surgery;  Laterality: N/A;  . MITRAL VALVE REPLACEMENT Right 12/13/2018   Procedure: MINIMALLY INVASIVE MITRAL VALVE (MV) REPLACEMENT using a Magna Mitral Ease 31 MM Valve.;  Surgeon: Rexene Alberts, MD;  Location: [place];  Service: Open Heart Surgery;  Laterality: Right;  . RIGHT/LEFT HEART CATH AND CORONARY ANGIOGRAPHY N/A 10/18/2018   Procedure: RIGHT/LEFT HEART CATH AND CORONARY ANGIOGRAPHY;  Surgeon: Leonie Man, MD;  Location: [place] CV LAB;  Service: Cardiovascular;  Laterality: N/A;  . TEE WITHOUT CARDIOVERSION N/A 06/23/2017   Procedure: TRANSESOPHAGEAL ECHOCARDIOGRAM (TEE);  Surgeon: Fay Records, MD;  Location: [place];  Service: Cardiovascular;  Laterality: N/A;  . TEE WITHOUT CARDIOVERSION N/A 10/17/2018   Procedure: TRANSESOPHAGEAL ECHOCARDIOGRAM (TEE);  Surgeon: Jerline Pain, MD;  Location: [facility] ENDOSCOPY;  Service: Cardiovascular;  Laterality: N/A;  . TEE WITHOUT CARDIOVERSION N/A 12/13/2018   Procedure: TRANSESOPHAGEAL ECHOCARDIOGRAM (TEE);  Surgeon: Rexene Alberts, MD;  Location: [place];  Service: Open Heart Surgery;  Laterality: N/A;  .  TONSILLECTOMY AND ADENOIDECTOMY  1990s  . UVULOPALATOPHARYNGOPLASTY, TONSILLECTOMY AND SEPTOPLASTY  1990s    Current Outpatient Medications  Medication Sig Dispense Refill  . acetaminophen (TYLENOL) 500 MG tablet Take 1,000 mg by mouth every 6 (six) hours as needed for moderate pain or headache.     . ALPRAZolam (XANAX) 0.25 MG tablet Take 0.125 mg by mouth 2 (two) times daily as needed for anxiety.     Marland Kitchen aspirin EC 81 MG EC tablet Take 1 tablet (81 mg total) by mouth daily.    . carvedilol (COREG) 12.5 MG tablet Take 1 tablet (12.5 mg total) by mouth 2 (two) times daily with a meal. 60 tablet 2  . cetirizine (ZYRTEC) 10 MG tablet Take 10 mg by mouth at bedtime.     . Cholecalciferol (VITAMIN D) 2000 UNITS CAPS Take 2,000 Units by mouth daily.      Marland Kitchen conjugated estrogens (PREMARIN) vaginal cream Place 1 Applicatorful vaginally daily as needed (irritation).     Marland Kitchen diltiazem (CARDIZEM CD) 120 MG 24 hr capsule Take 1 capsule (120 mg total) by mouth daily. 30 capsule 2  . dofetilide (TIKOSYN) 500 MCG capsule Take 1 capsule (500 mcg total) by mouth 2 (two) times daily. 60 capsule 2  . dorzolamide-timolol (COSOPT) 22.3-6.8 MG/ML ophthalmic solution Place 1 drop into both eyes daily.    . famotidine (PEPCID) 20 MG  tablet Take 20 mg by mouth at bedtime.    . furosemide (LASIX) 40 MG tablet Take 1 tablet (40 mg total) by mouth daily as needed (for worsening swelling or weight gain of 3lbs or more). 30 tablet 1  . gabapentin (NEURONTIN) 300 MG capsule Take 300 mg by mouth 3 (three) times daily.     Marland Kitchen lactobacillus acidophilus (BACID) TABS tablet Take 1 tablet by mouth daily.     Marland Kitchen latanoprost (XALATAN) 0.005 % ophthalmic solution Place 1 drop into both eyes at bedtime.   6  . Multiple Vitamin (MULTIVITAMIN WITH MINERALS) TABS tablet Take 1 tablet by mouth daily.    . NON FORMULARY Apply 1 application topically 2 (two) times daily as needed (for eczema). Traimcinolone/[facility] Moist Cream    . omeprazole  (PRILOSEC) 40 MG capsule Take 40 mg by mouth daily.     Marland Kitchen oxybutynin (DITROPAN-XL) 10 MG 24 hr tablet Take 10 mg by mouth daily.     . polyethylene glycol (MIRALAX / GLYCOLAX) 17 g packet Take 17 g by mouth daily as needed for moderate constipation.     . potassium chloride (KLOR-CON) 10 MEQ tablet Take 1 tablet (10 mEq total) by mouth daily as needed (if you need to take Lasix). 30 tablet 2  . PRESCRIPTION MEDICATION Inhale into the lungs at bedtime. CPAP    . warfarin (COUMADIN) 2.5 MG tablet Take 1 tablet (2.5 mg total) by mouth daily at 6 PM. Take 1 tablet by mouth daily or as directed by coumadin clinic (Patient taking differently: Take 2.5 mg by mouth See admin instructions. Take 2 tablets on Monday then take 1 tablet all other days) 100 tablet 1   No current facility-administered medications for this encounter.    Allergies  Allergen Reactions  . Amoxicillin Diarrhea    Has patient had a PCN reaction causing immediate rash, facial/tongue/throat swelling, SOB or lightheadedness with hypotension: no Has patient had a PCN reaction causing severe rash involving mucus membranes or skin necrosis: no Has patient had a PCN reaction that required hospitalization: no Has patient had a PCN reaction occurring within the last 10 years: no If all of the above answers are "NO", then may proceed with Cephalosporin use.   . Metoprolol Tartrate Other (See Comments)    Mouth sores "mouth tastes like mold"  . Oxaprozin Nausea Only    Social History   Socioeconomic History  . Marital status: Married    Spouse name: Not on file  . Number of children: Not on file  . Years of education: Not on file  . Highest education level: Not on file  Occupational History  . Occupation: Retired  Tobacco Use  . Smoking status: Never Smoker  . Smokeless tobacco: Never Used  Substance and Sexual Activity  . Alcohol use: Yes    Comment: rarely  . Drug use: No  . Sexual activity: Not Currently  Other Topics  Concern  . Not on file  Social History Narrative   Lives with husband in [place].   Social Determinants of Health   Financial Resource Strain:   . Difficulty of Paying Living Expenses: Not on file  Food Insecurity:   . Worried About Charity fundraiser in the Last Year: Not on file  . Ran Out of Food in the Last Year: Not on file  Transportation Needs:   . Lack of Transportation (Medical): Not on file  . Lack of Transportation (Non-Medical): Not on file  Physical Activity:   .  Days of Exercise per Week: Not on file  . Minutes of Exercise per Session: Not on file  Stress:   . Feeling of Stress : Not on file  Social Connections:   . Frequency of Communication with Friends and Family: Not on file  . Frequency of Social Gatherings with Friends and Family: Not on file  . Attends Religious Services: Not on file  . Active Member of Clubs or Organizations: Not on file  . Attends Archivist Meetings: Not on file  . Marital Status: Not on file  Intimate Partner Violence:   . Fear of Current or Ex-Partner: Not on file  . Emotionally Abused: Not on file  . Physically Abused: Not on file  . Sexually Abused: Not on file    Family History  Problem Relation Age of Onset  . Heart attack Mother   . Hypertension Brother   . Kidney disease Brother   . Diabetes Brother   . Heart failure Maternal Grandfather     ROS- All systems are reviewed and negative except as per the HPI above  Physical Exam: Vitals:   02/05/19 1137  BP: (!) 134/58  Pulse: (!) 55  Weight: 68.6 kg  Height: 5' (1.524 m)   Wt Readings from Last 3 Encounters:  02/05/19 68.6 kg  01/23/19 69.2 kg  01/11/19 70.3 kg    Labs: Lab Results  Component Value Date   NA 138 02/05/2019   K 4.1 02/05/2019   CL 106 02/05/2019   CO2 23 02/05/2019   GLUCOSE 96 02/05/2019   BUN 10 02/05/2019   CREATININE 0.69 02/05/2019   CALCIUM 8.8 (L) 02/05/2019   MG 2.1 02/05/2019   Lab Results  Component Value  Date   INR 2.4 02/02/2019   No results found for: CHOL, HDL, LDLCALC, TRIG   GEN- The patient is well appearing, alert and oriented x 3 today.   Head- normocephalic, atraumatic Eyes-  Sclera clear, conjunctiva pink Ears- hearing intact Oropharynx- clear Neck- supple, no JVP Lymph- no cervical lymphadenopathy Lungs- Clear to ausculation bilaterally, normal work of breathing Heart- Regular rate and rhythm, no murmurs, rubs or gallops, PMI not laterally displaced GI- soft, NT, ND, + BS Extremities- no clubbing, cyanosis, or edema MS- no significant deformity or atrophy Skin- no rash or lesion Psych- euthymic mood, full affect Neuro- strength and sensation are intact  EKG- Sinus brady with sinus arrhythmia at 55 bpm. PR int 204 ms, qrs int 476 ms, qtc 476 ms Epic  records reviewed    Assessment and Plan: 1. Afib/flutter s/p Maze/ valve repair 11/2018 Now in SR after Tikosyn load Continue on Tikosyn 500 mcg bid  qtc is stable  General tikosyn guidelines of use reviewed bmet/mag  2. CHA2DS2VASc score is 4 Continue on wafarin  F/u with Dr. Oval Linsey 7/13 and Dr. Curt Bears 2/9   Geroge Baseman. Aran Menning, [facility] [address] [place], [place] [address] [phone]

## 2019-02-07 ENCOUNTER — Ambulatory Visit: Payer: Medicare Other | Admitting: Cardiovascular Disease

## 2019-02-07 ENCOUNTER — Ambulatory Visit (INDEPENDENT_AMBULATORY_CARE_PROVIDER_SITE_OTHER): Payer: Medicare Other | Admitting: Cardiovascular Disease

## 2019-02-07 ENCOUNTER — Other Ambulatory Visit: Payer: Self-pay

## 2019-02-07 VITALS — BP 142/86 | Ht 60.0 in | Wt 150.4 lb

## 2019-02-07 DIAGNOSIS — Z9889 Other specified postprocedural states: Secondary | ICD-10-CM | POA: Diagnosis not present

## 2019-02-07 DIAGNOSIS — I4819 Other persistent atrial fibrillation: Secondary | ICD-10-CM

## 2019-02-07 DIAGNOSIS — E7800 Pure hypercholesterolemia, unspecified: Secondary | ICD-10-CM

## 2019-02-07 DIAGNOSIS — Z5181 Encounter for therapeutic drug level monitoring: Secondary | ICD-10-CM | POA: Diagnosis not present

## 2019-02-07 DIAGNOSIS — I10 Essential (primary) hypertension: Secondary | ICD-10-CM

## 2019-02-07 MED ORDER — LOSARTAN POTASSIUM 25 MG PO TABS: 25.0000 mg | ORAL_TABLET | Freq: Every day | ORAL | 3 refills | Status: DC

## 2019-02-07 NOTE — Patient Instructions (Addendum)
Medication Instructions:  START LOSARTAN 25 MG DAILY   *If you need a refill on your cardiac medications before your next appointment, please call your pharmacy*  Lab Work: BMET IN 1 WEEK  If you have labs (blood work) drawn today and your tests are completely normal, you will receive your results only by: Marland Kitchen MyChart Message (if you have MyChart) OR . A paper copy in the mail If you have any lab test that is abnormal or we need to change your treatment, we will call you to review the results.  Testing/Procedures: NONE  Follow-Up: At [facility], you and your health needs are our priority.  As part of our continuing mission to provide you with exceptional heart care, we have created designated Provider Care Teams.  These Care Teams include your primary Cardiologist (physician) and Advanced Practice Providers (APPs -  Physician Assistants and Nurse Practitioners) who all work together to provide you with the care you need, when you need it.  Your next appointment:   2 month(s)  The format for your next appointment:   Either In Person or Virtual  Provider:   You may see Skeet Latch, MD or one of the following Advanced Practice Providers on your designated Care Team:    Kerin Ransom, PA-C  [place], [place]  Coletta Memos, [place]   Other Instructions  [place]

## 2019-02-07 NOTE — Progress Notes (Signed)
Cardiology Office Note  Date:  02/25/2019   ID:  Mary Farrell, DOB [birth year], MRN [id number]  PCP:  Lawerance Cruel, MD  Cardiologist:   Skeet Latch, MD  Electrophysiologist: Dr. Curt Bears  No chief complaint on file.   History of Present Illness: Mary Farrell is a 74 y.o. female with moderate aortic regurgitation, Rheumatic mitral stenosis s/p MVR, chronic pericardial effusion, paroxysmal atrial flutter s/p ablation 05/2017, MAZE 11/2018 and hypertension who presents for follow up.  Mary Farrell was previously a patient of Dr. Mare Ferrari.  She followed up with Truitt Merle on 04/2015 due to shortness of breath that she felt was getting worse.  She was referred for an echo 04/2015 that revealed LVEF 55-60% with moderate AR and mild-moderate MS.  There was also a moderate pericardial effusion but no evidence of tamponade.  She had a heart cath 12/1994 that revealed normal coronaries with mild MS and mild pulmonary hypertension.  She also had a Cardiolite in 2007 that was negative for ischemia.  She was seen in clinic 07/2015 and reported persistent shortness of breath with minimal exertion and chest discomfort.  She was referred for exercise Myoview 08/26/15 that revealed LVEF 60% and no perfusion defects.    Mary Farrell was admitted 08/2015 with atrial flutter.  She had an echo that showed a persistent moderate-severe pericardial effusion but no tamponade.  She was started on amiodarone, which she did not tolerate due to nausea.  She underwent DCCV on 09/22/15 and then was started on flecainide.  She continued to report shortness of breath and was again referred for an echo that revealed LVEF 60-65% with grade 2 diastolic dysfunction, mild aortic regurgitation, and moderate mitral stenosis. She had a moderate pericardial effusion localized to the inferior and inferolateral walls. There was no evidence of tamponade.  Mary Farrell had an outpatient DCCV on 10/1 and converted with one shock at [place].  She  developed recurrent atrial fibrillation and was referred to EP.  She underwent afib/flutter ablation with Dr. Curt Bears on 06/24/17. She was started on flecainide but had recurent disease and was started on Tikosyn.  Her mitral valve was replaced 11/2018 (31 mm [facility] bioprosthetic).  She had a MAZE at that time.  She followed up with Laroy Apple 1/13 and was doing well.  She has been feeling a little better but didn't immediately feel better immediately after going back into rhythm. It seems to be getting a little better lately.  She never got to go to cardiac rehab after surgery but had an injury that prevented it. She notices that her BP has been elevated in the AM.  It has been running in the 140s at home.   Past Medical History:  Diagnosis Date  . Anxiety 06/29/2012  . Aortic regurgitation 08/13/2015  . Arthritis    "hands, wrists, back, feet, toes" (06/24/2017)  . Atrial flutter ([place]) 09/18/2015  . Atypical chest pain 05/13/2016  . Chest pain    remote cath in 1996 with NORMAL coronaries noted  . CHF (congestive heart failure) ([place])   . Dyspnea 10/30/2010  . Essential hypertension 09/05/2013  . Exogenous obesity    history of   . GERD (gastroesophageal reflux disease)   . Glaucoma, both eyes   . Headache, migraine    "stopped in my 50's" (09/18/2015)  . History of cardiovascular stress test 2007   showing no ischemia  . Hypertension   . Mitral stenosis and aortic insufficiency 06/23/2010  .  Mitral stenosis with insufficiency   . Mitral stenosis with regurgitation   . Murmur    of mitral stenosis and moderate aortic insufficiency      . Nausea 05/01/2018  . OSA on CPAP   . Palpitations    occasional  . Paroxysmal A-fib ([place]) 06/24/2017  . Pericardial effusion 09/18/2015  . Persistent atrial fibrillation ([place]) 03/10/2017  . Personal history of rheumatic heart disease   . S/P minimally-invasive maze operation for atrial fibrillation 12/13/2018   Complete bilateral atrial lesion set  using cryothermy and bipolar radiofrequency ablation with clipping of LA appendage via right mini-thoracotomy approach  . S/P minimally-invasive mitral valve replacement with bioprosthetic valve 12/13/2018   31 mm [facility] Mitral stented bovine pericardial tissue valve  . SBE (subacute bacterial endocarditis)    prophylaxis, patient unaware  . Sliding hiatal hernia   . Vertigo 05/01/2018    Past Surgical History:  Procedure Laterality Date  . ABDOMINAL HYSTERECTOMY  1975  . APPENDECTOMY  1977  . ATRIAL FIBRILLATION ABLATION N/A 03/10/2017   Procedure: ATRIAL FIBRILLATION ABLATION;  Surgeon: [name], MD;  Location: [place] [facility];  Service: Cardiovascular;  Laterality: N/A;  . ATRIAL FIBRILLATION ABLATION  06/24/2017  . ATRIAL FIBRILLATION ABLATION N/A 06/24/2017   Procedure: ATRIAL FIBRILLATION ABLATION;  Surgeon: [name], MD;  Location: [place] [facility];  Service: Cardiovascular;  Laterality: N/A;  . BUNIONECTOMY Right 2000s  . CARDIAC CATHETERIZATION  01/13/1995   normal coronary anatomy and mild mitral stenosis and mild pulmonary hypertension  . CARDIOVERSION N/A 09/22/2015   Procedure: CARDIOVERSION;  Surgeon: Jerline Pain, MD;  Location: [place];  Service: Cardiovascular;  Laterality: N/A;  . CARDIOVERSION N/A 10/25/2016   Procedure: CARDIOVERSION;  Surgeon: Fay Records, MD;  Location: [place];  Service: Cardiovascular;  Laterality: N/A;  . CARDIOVERSION N/A 04/15/2017   Procedure: CARDIOVERSION;  Surgeon: Josue Hector, MD;  Location: [facility] ENDOSCOPY;  Service: Cardiovascular;  Laterality: N/A;  . CARDIOVERSION N/A 05/16/2017   Procedure: CARDIOVERSION;  Surgeon: Pixie Casino, MD;  Location: [facility] ENDOSCOPY;  Service: Cardiovascular;  Laterality: N/A;  . CARDIOVERSION N/A 01/24/2019   Procedure: CARDIOVERSION;  Surgeon: Pixie Casino, MD;  Location: [facility] ENDOSCOPY;  Service: Cardiovascular;  Laterality: N/A;  . CATARACT EXTRACTION W/  INTRAOCULAR LENS  IMPLANT, BILATERAL Bilateral 2013  . COLONOSCOPY  2013  . EYE SURGERY Bilateral    cataracts  . LAPAROSCOPIC CHOLECYSTECTOMY  1997  . MINIMALLY INVASIVE MAZE PROCEDURE N/A 12/13/2018   Procedure: MINIMALLY INVASIVE MAZE PROCEDURE using a 45 MM AtriClip.;  Surgeon: Rexene Alberts, MD;  Location: [place];  Service: Open Heart Surgery;  Laterality: N/A;  . MITRAL VALVE REPLACEMENT Right 12/13/2018   Procedure: MINIMALLY INVASIVE MITRAL VALVE (MV) REPLACEMENT using a Magna Mitral Ease 31 MM Valve.;  Surgeon: Rexene Alberts, MD;  Location: [place];  Service: Open Heart Surgery;  Laterality: Right;  . RIGHT/LEFT HEART CATH AND CORONARY ANGIOGRAPHY N/A 10/18/2018   Procedure: RIGHT/LEFT HEART CATH AND CORONARY ANGIOGRAPHY;  Surgeon: Leonie Man, MD;  Location: [place] [facility];  Service: Cardiovascular;  Laterality: N/A;  . TEE WITHOUT CARDIOVERSION N/A 06/23/2017   Procedure: TRANSESOPHAGEAL ECHOCARDIOGRAM (TEE);  Surgeon: Fay Records, MD;  Location: [place];  Service: Cardiovascular;  Laterality: N/A;  . TEE WITHOUT CARDIOVERSION N/A 10/17/2018   Procedure: TRANSESOPHAGEAL ECHOCARDIOGRAM (TEE);  Surgeon: Jerline Pain, MD;  Location: [facility] ENDOSCOPY;  Service: Cardiovascular;  Laterality: N/A;  .  TEE WITHOUT CARDIOVERSION N/A 12/13/2018   Procedure: TRANSESOPHAGEAL ECHOCARDIOGRAM (TEE);  Surgeon: Rexene Alberts, MD;  Location: [place];  Service: Open Heart Surgery;  Laterality: N/A;  . TONSILLECTOMY AND ADENOIDECTOMY  1990s  . UVULOPALATOPHARYNGOPLASTY, TONSILLECTOMY AND SEPTOPLASTY  1990s     Current Outpatient Medications  Medication Sig Dispense Refill  . acetaminophen (TYLENOL) 500 MG tablet Take 1,000 mg by mouth every 6 (six) hours as needed for moderate pain or headache.     . ALPRAZolam (XANAX) 0.25 MG tablet Take 0.125 mg by mouth 2 (two) times daily as needed for anxiety.     Marland Kitchen aspirin EC 81 MG EC tablet Take 1 tablet (81 mg total) by mouth daily.    .  carvedilol (COREG) 12.5 MG tablet Take 1 tablet (12.5 mg total) by mouth 2 (two) times daily with a meal. 60 tablet 2  . cetirizine (ZYRTEC) 10 MG tablet Take 10 mg by mouth at bedtime.     . Cholecalciferol (VITAMIN D) 2000 UNITS CAPS Take 2,000 Units by mouth daily.      Marland Kitchen conjugated estrogens (PREMARIN) vaginal cream Place 1 Applicatorful vaginally daily as needed (irritation).     Marland Kitchen diltiazem (CARDIZEM CD) 120 MG 24 hr capsule Take 1 capsule (120 mg total) by mouth daily. 30 capsule 2  . dofetilide (TIKOSYN) 500 MCG capsule Take 1 capsule (500 mcg total) by mouth 2 (two) times daily. 60 capsule 2  . dorzolamide-timolol (COSOPT) 22.3-6.8 MG/ML ophthalmic solution Place 1 drop into both eyes daily.    . famotidine (PEPCID) 20 MG tablet Take 20 mg by mouth at bedtime.    . furosemide (LASIX) 40 MG tablet Take 1 tablet (40 mg total) by mouth daily as needed (for worsening swelling or weight gain of 3lbs or more). 30 tablet 1  . gabapentin (NEURONTIN) 300 MG capsule Take 300 mg by mouth 3 (three) times daily.     Marland Kitchen lactobacillus acidophilus (BACID) TABS tablet Take 1 tablet by mouth daily.     Marland Kitchen latanoprost (XALATAN) 0.005 % ophthalmic solution Place 1 drop into both eyes at bedtime.   6  . losartan (COZAAR) 25 MG tablet Take 1 tablet (25 mg total) by mouth daily. 90 tablet 3  . Multiple Vitamin (MULTIVITAMIN WITH MINERALS) TABS tablet Take 1 tablet by mouth daily.    . NON FORMULARY Apply 1 application topically 2 (two) times daily as needed (for eczema). Traimcinolone/[employer] Moist Cream    . omeprazole (PRILOSEC) 40 MG capsule Take 40 mg by mouth daily.     Marland Kitchen oxybutynin (DITROPAN-XL) 10 MG 24 hr tablet Take 10 mg by mouth daily.     . polyethylene glycol (MIRALAX / GLYCOLAX) 17 g packet Take 17 g by mouth daily as needed for moderate constipation.     . potassium chloride (KLOR-CON) 10 MEQ tablet Take 1 tablet (10 mEq total) by mouth daily as needed (if you need to take Lasix). 30 tablet 2  .  PRESCRIPTION MEDICATION Inhale into the lungs at bedtime. CPAP    . warfarin (COUMADIN) 2.5 MG tablet Take 1 tablet (2.5 mg total) by mouth daily at 6 PM. Take 1 tablet by mouth daily or as directed by coumadin clinic (Patient taking differently: Take 2.5 mg by mouth See admin instructions. Take 2 tablets on Monday then take 1 tablet all other days) 100 tablet 1  . warfarin (COUMADIN) 5 MG tablet TAKE 1 TABLET BY MOUTH DAILY OR AS DIRECTED BY COUMADIN CLINIC 90 tablet  0   No current facility-administered medications for this visit.    Allergies:   Amoxicillin, Metoprolol tartrate, and Oxaprozin    Social History:  The patient  reports that she has never smoked. She has never used smokeless tobacco. She reports current alcohol use. She reports that she does not use drugs.   Family History:  The patient's  family history includes Diabetes in her brother; Heart attack in her mother; Heart failure in her maternal grandfather; Hypertension in her brother; Kidney disease in her brother.    ROS:  Please see the history of present illness.   Otherwise, review of systems are positive for none.   All other systems are reviewed and negative.    PHYSICAL EXAM: VS:  BP (!) 142/86   Ht 5' (1.524 m)   Wt 150 lb 6.4 oz (68.2 kg)   BMI 29.37 kg/m  , BMI Body mass index is 29.37 kg/m. GENERAL:  Well appearing HEENT: Pupils equal round and reactive, fundi not visualized, oral mucosa unremarkable NECK:  No jugular venous distention, waveform within normal limits, carotid upstroke brisk and symmetric, no bruits LUNGS:  Clear to auscultation bilaterally HEART:  RRR.  PMI not displaced or sustained,S1 and S2 within normal limits, + S3, no S4, no clicks, no rubs, III/VI diastolic murmur at the LUSB.  III/VI holosystolic murmur at the apex.   ABD:  Flat, positive bowel sounds normal in frequency in pitch, no bruits, no rebound, no guarding, no midline pulsatile mass, no hepatomegaly, no splenomegaly EXT:  2  plus pulses throughout, no edema, no cyanosis no clubbing SKIN:  No rashes no nodules NEURO:  Cranial nerves II through XII grossly intact, motor grossly intact throughout PSYCH:  Cognitively intact, oriented to person place and time.Ansious   EKG:  EKG is not ordered today. The ekg ordered 05/07/15 demonstraSinus bradycardia rate 57 bpm. 05/13/16: Sinus rhythm. Rate 66 bpm. 09/09/16: Sinus rhythm. Rate 64 bpm.   10/26/16: Sinus rhythm.  Rate 74 bpm.  Exercise Myoveiw 08/26/15:  The left ventricular ejection fraction is normal (55-65%).  Nuclear stress EF: 60%.  Blood pressure demonstrated a hypertensive response to exercise.  Upsloping ST segment depression ST segment depression of 1 mm was noted during stress in the II, III, V6, V5 and aVF leads.  This is a low risk study.   No reversible ischemia. LVEF 60% with normal wall motion. Fair exercise tolerance. No chest pain. This is a low risk study.  TTE 01/23/19: 1. Left ventricular ejection fraction, by visual estimation, is 55 to  60%. The left ventricle has normal function. Left ventricular septal wall  thickness was mildly increased. Moderately increased left ventricular  posterior wall thickness. There is  mildly increased left ventricular hypertrophy.  2. Left ventricular diastolic parameters are indeterminate.  3. The left ventricle demonstrates regional wall motion abnormalities.  4. Elevated LVEDP. Hypokinesis of the basal septum consistent with  post-operative state.  5. Global right ventricle has normal systolic function.The right  ventricular size is normal. No increase in right ventricular wall  thickness.  6. Left atrial size was severely dilated.  7. Right atrial size was normal.  8. The mitral valve has been repaired/replaced. No evidence of mitral  valve regurgitation. No evidence of mitral stenosis.  9. The tricuspid valve is normal in structure.  10. The aortic valve is tricuspid. Aortic valve  regurgitation is mild. No  evidence of aortic valve sclerosis or stenosis.  11. The pulmonic valve was normal in structure.  Pulmonic valve  regurgitation is not visualized.  12. Normal pulmonary artery systolic pressure.  13. The inferior vena cava is normal in size with <50% respiratory  variability, suggesting right atrial pressure of 8 mmHg.   Recent Labs: 01/22/2019: Hemoglobin 12.0; Platelets 355 01/28/2019: ALT 13 02/05/2019: Magnesium 2.1 02/15/2019: BUN 10; Creatinine, Ser 0.79; Potassium 4.3; Sodium 141   07/05/16: Total cholesterol 167, triglycerides 159, HDL 47, LDL 88 TSH 2.2 Sodium 141, potassium 3.8, BUN 14, creatinine 0.77 AST 13, ALT 14  08/03/2017: Total cholesterol 215, triglycerides 121, HDL 67, LDL 124  Lipid Panel No results found for: CHOL, TRIG, HDL, CHOLHDL, VLDL, LDLCALC, LDLDIRECT    Wt Readings from Last 3 Encounters:  02/07/19 150 lb 6.4 oz (68.2 kg)  02/05/19 151 lb 3.2 oz (68.6 kg)  01/23/19 152 lb 8.9 oz (69.2 kg)     ASSESSMENT AND PLAN:  # Persitent atrial flutter/fibrillation:  Now maintaining sinus rhythm on Tikosyn.  Continue carvedilol and diltiazem.  Anticoagulation with warfarin.  # Mitral stenosis s/p MVR: # Rheumatic heart disease: TEE showed severe MS.  She underwent successful minimally invasive bioprosthetic MVR with Dr. Roxy Manns 11/2018.  Mean gradient 5 mmHg 12/2018.  Continue aspirin.   # Hypertension: BP is above goal.  We will add losartan 25 mg daily.  Check BMP in one week.  Continue carvedilol, diltiazem, and HCTZ.    # Hyperlipidemia: ASCVD 10 year risk 15%.  She wants to really focus on diet and exercise before starting a statin.  We will recheck in 4 months and reevaluate.     Current medicines are reviewed at length with the patient today.  The patient does not have concerns regarding medicines.  The following changes have been made:  Restart losartan 25mg    Labs/ tests ordered today include:   Orders Placed This  Encounter  Procedures  . Basic metabolic panel     Disposition:   FU with Zyshawn Bohnenkamp C. Oval Linsey, MD, [facility] in 2 months virtual or in person    Signed, [place]. Oval Linsey, MD, [facility]  02/25/2019 8:42 PM    [place]

## 2019-02-15 LAB — BASIC METABOLIC PANEL
BUN/Creatinine Ratio: 13 (ref 12–28)
BUN: 10 mg/dL (ref 8–27)
CO2: 25 mmol/L (ref 20–29)
Calcium: 9.6 mg/dL (ref 8.7–10.3)
Chloride: 103 mmol/L (ref 96–106)
Creatinine, Ser: 0.79 mg/dL (ref 0.57–1.00)
GFR calc Af Amer: 85 mL/min/{1.73_m2} (ref >59)
GFR calc non Af Amer: 73 mL/min/{1.73_m2} (ref >59)
Glucose: 104 mg/dL — ABNORMAL HIGH (ref 65–99)
Potassium: 4.3 mmol/L (ref 3.5–5.2)
Sodium: 141 mmol/L (ref 134–144)

## 2019-02-21 ENCOUNTER — Other Ambulatory Visit: Payer: Self-pay | Admitting: Cardiovascular Disease

## 2019-02-23 ENCOUNTER — Other Ambulatory Visit: Payer: Self-pay

## 2019-02-23 ENCOUNTER — Ambulatory Visit (INDEPENDENT_AMBULATORY_CARE_PROVIDER_SITE_OTHER): Payer: Medicare Other | Admitting: Pharmacist Clinician (PhC)/ Clinical Pharmacy Specialist

## 2019-02-23 ENCOUNTER — Encounter: Payer: Self-pay | Admitting: Pharmacist Clinician (PhC)/ Clinical Pharmacy Specialist

## 2019-02-23 DIAGNOSIS — I480 Paroxysmal atrial fibrillation: Secondary | ICD-10-CM

## 2019-02-23 DIAGNOSIS — Z7901 Long term (current) use of anticoagulants: Secondary | ICD-10-CM | POA: Diagnosis not present

## 2019-02-23 LAB — POCT INR: INR: 2.6 (ref 2.0–3.0)

## 2019-02-25 ENCOUNTER — Encounter: Payer: Self-pay | Admitting: Cardiovascular Disease

## 2019-03-06 ENCOUNTER — Ambulatory Visit (INDEPENDENT_AMBULATORY_CARE_PROVIDER_SITE_OTHER): Payer: Medicare Other | Admitting: Cardiology

## 2019-03-06 ENCOUNTER — Other Ambulatory Visit: Payer: Self-pay

## 2019-03-06 ENCOUNTER — Encounter: Payer: Self-pay | Admitting: Cardiology

## 2019-03-06 VITALS — BP 130/70 | HR 56 | Ht 60.0 in | Wt 153.2 lb

## 2019-03-06 DIAGNOSIS — I4819 Other persistent atrial fibrillation: Secondary | ICD-10-CM | POA: Diagnosis not present

## 2019-03-06 NOTE — Addendum Note (Signed)
Addended by: Stanton Kidney on: 03/06/2019 02:34 PM   Modules accepted: Orders

## 2019-03-06 NOTE — Patient Instructions (Signed)
Medication Instructions:  Your physician has recommended you make the following change in your medication:  1. STOP Diltiazem  *If you need a refill on your cardiac medications before your next appointment, please call your pharmacy*  Lab Work: None ordered If you have labs (blood work) drawn today and your tests are completely normal, you will receive your results only by: Marland Kitchen MyChart Message (if you have MyChart) OR . A paper copy in the mail If you have any lab test that is abnormal or we need to change your treatment, we will call you to review the results.  Testing/Procedures: None ordered  Follow-Up: At [facility], you and your health needs are our priority.  As part of our continuing mission to provide you with exceptional heart care, we have created designated Provider Care Teams.  These Care Teams include your primary Cardiologist (physician) and Advanced Practice Providers (APPs -  Physician Assistants and Nurse Practitioners) who all work together to provide you with the care you need, when you need it.  Your next appointment:   6 month(s)  The format for your next appointment:   In Person  Provider:   Allegra Lai, MD

## 2019-03-06 NOTE — Progress Notes (Signed)
Electrophysiology Office Note   Date:  03/06/2019   ID:  Mary Farrell, DOB 12-Jul-1945, MRN [id number]  PCP:  Lawerance Cruel, MD  Cardiologist:  Oval Linsey Primary Electrophysiologist:  Adasyn Mcadams Meredith Leeds, MD    No chief complaint on file.    History of Present Illness: Mary Farrell is a 74 y.o. female who is being seen today for the evaluation of atrial fibrillation at the request of Lawerance Cruel, MD. Presenting today for electrophysiology evaluation. She has a history of moderate aortic regurgitation, mild to moderate mitral stenosis, chronic pericardial effusion, paroxysmal atrial flutter, and hypertension. She had a heart catheterization in 1996 revealed normal coronaries with mild MS and mild pulmonary hypertension.  She was admitted 09/14/2015 with atrial flutter. She was started on amiodarone but did not tolerate it due to nausea.  He has had multiple cardioversions in the past.  She had an atrial fibrillation/flutter ablation 03/10/2017 with repeat ablation 06/13/2017.  She is now status post surgical MVR and maze November 2020.  She unfortunately had multiple atrial arrhythmias and has since been loaded on dofetilide.  Today, denies symptoms of palpitations, chest pain, shortness of breath, orthopnea, PND, lower extremity edema, claudication, dizziness, presyncope, syncope, bleeding, or neurologic sequela. The patient is tolerating medications without difficulties.  Overall she has been doing well.  She is now status post minimally invasive mitral valve replacement with surgical maze procedure.  She tolerated that procedure well.  She did have some atrial fibrillation postop and was put on flecainide.  She is in junctional rhythm today.  She was also having some shortness of breath and is since been put on Lasix.   Past Medical History:  Diagnosis Date  . Anxiety 06/29/2012  . Aortic regurgitation 08/13/2015  . Arthritis    "hands, wrists, back, feet, toes" (06/24/2017)  .  Atrial flutter ([place]) 09/18/2015  . Atypical chest pain 05/13/2016  . Chest pain    remote cath in 1996 with NORMAL coronaries noted  . CHF (congestive heart failure) ([place])   . Dyspnea 10/30/2010  . Essential hypertension 09/05/2013  . Exogenous obesity    history of   . GERD (gastroesophageal reflux disease)   . Glaucoma, both eyes   . Headache, migraine    "stopped in my 50's" (09/18/2015)  . History of cardiovascular stress test 2007   showing no ischemia  . Hypertension   . Mitral stenosis and aortic insufficiency 06/23/2010  . Mitral stenosis with insufficiency   . Mitral stenosis with regurgitation   . Murmur    of mitral stenosis and moderate aortic insufficiency      . Nausea 05/01/2018  . OSA on CPAP   . Palpitations    occasional  . Paroxysmal A-fib ([place]) 06/24/2017  . Pericardial effusion 09/18/2015  . Persistent atrial fibrillation ([place]) 03/10/2017  . Personal history of rheumatic heart disease   . S/P minimally-invasive maze operation for atrial fibrillation 12/13/2018   Complete bilateral atrial lesion set using cryothermy and bipolar radiofrequency ablation with clipping of LA appendage via right mini-thoracotomy approach  . S/P minimally-invasive mitral valve replacement with bioprosthetic valve 12/13/2018   31 mm [facility] Mitral stented bovine pericardial tissue valve  . SBE (subacute bacterial endocarditis)    prophylaxis, patient unaware  . Sliding hiatal hernia   . Vertigo 05/01/2018   Past Surgical History:  Procedure Laterality Date  . ABDOMINAL HYSTERECTOMY  1975  . APPENDECTOMY  1977  . ATRIAL FIBRILLATION ABLATION N/A 03/10/2017  Procedure: ATRIAL FIBRILLATION ABLATION;  Surgeon: [name], MD;  Location: [place] [facility];  Service: Cardiovascular;  Laterality: N/A;  . ATRIAL FIBRILLATION ABLATION  06/24/2017  . ATRIAL FIBRILLATION ABLATION N/A 06/24/2017   Procedure: ATRIAL FIBRILLATION ABLATION;  Surgeon: [name], MD;  Location:  [place] [facility];  Service: Cardiovascular;  Laterality: N/A;  . BUNIONECTOMY Right 2000s  . CARDIAC CATHETERIZATION  01/13/1995   normal coronary anatomy and mild mitral stenosis and mild pulmonary hypertension  . CARDIOVERSION N/A 09/22/2015   Procedure: CARDIOVERSION;  Surgeon: Jerline Pain, MD;  Location: [place];  Service: Cardiovascular;  Laterality: N/A;  . CARDIOVERSION N/A 10/25/2016   Procedure: CARDIOVERSION;  Surgeon: Fay Records, MD;  Location: [place];  Service: Cardiovascular;  Laterality: N/A;  . CARDIOVERSION N/A 04/15/2017   Procedure: CARDIOVERSION;  Surgeon: Josue Hector, MD;  Location: [facility] ENDOSCOPY;  Service: Cardiovascular;  Laterality: N/A;  . CARDIOVERSION N/A 05/16/2017   Procedure: CARDIOVERSION;  Surgeon: Pixie Casino, MD;  Location: [facility] ENDOSCOPY;  Service: Cardiovascular;  Laterality: N/A;  . CARDIOVERSION N/A 01/24/2019   Procedure: CARDIOVERSION;  Surgeon: Pixie Casino, MD;  Location: [facility] ENDOSCOPY;  Service: Cardiovascular;  Laterality: N/A;  . CATARACT EXTRACTION W/ INTRAOCULAR LENS  IMPLANT, BILATERAL Bilateral 2013  . COLONOSCOPY  2013  . EYE SURGERY Bilateral    cataracts  . LAPAROSCOPIC CHOLECYSTECTOMY  1997  . MINIMALLY INVASIVE MAZE PROCEDURE N/A 12/13/2018   Procedure: MINIMALLY INVASIVE MAZE PROCEDURE using a 45 MM AtriClip.;  Surgeon: Rexene Alberts, MD;  Location: [place];  Service: Open Heart Surgery;  Laterality: N/A;  . MITRAL VALVE REPLACEMENT Right 12/13/2018   Procedure: MINIMALLY INVASIVE MITRAL VALVE (MV) REPLACEMENT using a Magna Mitral Ease 31 MM Valve.;  Surgeon: Rexene Alberts, MD;  Location: [place];  Service: Open Heart Surgery;  Laterality: Right;  . RIGHT/LEFT HEART CATH AND CORONARY ANGIOGRAPHY N/A 10/18/2018   Procedure: RIGHT/LEFT HEART CATH AND CORONARY ANGIOGRAPHY;  Surgeon: Leonie Man, MD;  Location: [place] [facility];  Service: Cardiovascular;  Laterality: N/A;  . TEE WITHOUT CARDIOVERSION N/A  06/23/2017   Procedure: TRANSESOPHAGEAL ECHOCARDIOGRAM (TEE);  Surgeon: Fay Records, MD;  Location: [place];  Service: Cardiovascular;  Laterality: N/A;  . TEE WITHOUT CARDIOVERSION N/A 10/17/2018   Procedure: TRANSESOPHAGEAL ECHOCARDIOGRAM (TEE);  Surgeon: Jerline Pain, MD;  Location: [facility] ENDOSCOPY;  Service: Cardiovascular;  Laterality: N/A;  . TEE WITHOUT CARDIOVERSION N/A 12/13/2018   Procedure: TRANSESOPHAGEAL ECHOCARDIOGRAM (TEE);  Surgeon: Rexene Alberts, MD;  Location: [place];  Service: Open Heart Surgery;  Laterality: N/A;  . TONSILLECTOMY AND ADENOIDECTOMY  1990s  . UVULOPALATOPHARYNGOPLASTY, TONSILLECTOMY AND SEPTOPLASTY  1990s     Current Outpatient Medications  Medication Sig Dispense Refill  . acetaminophen (TYLENOL) 500 MG tablet Take 1,000 mg by mouth every 6 (six) hours as needed for moderate pain or headache.     . ALPRAZolam (XANAX) 0.25 MG tablet Take 0.125 mg by mouth 2 (two) times daily as needed for anxiety.     Marland Kitchen aspirin EC 81 MG EC tablet Take 1 tablet (81 mg total) by mouth daily.    . carvedilol (COREG) 12.5 MG tablet Take 1 tablet (12.5 mg total) by mouth 2 (two) times daily with a meal. 60 tablet 2  . cetirizine (ZYRTEC) 10 MG tablet Take 10 mg by mouth at bedtime.     . Cholecalciferol (VITAMIN D) 2000 UNITS CAPS Take 2,000 Units by mouth daily.      Marland Kitchen  conjugated estrogens (PREMARIN) vaginal cream Place 1 Applicatorful vaginally daily as needed (irritation).     Marland Kitchen diltiazem (CARDIZEM CD) 120 MG 24 hr capsule Take 1 capsule (120 mg total) by mouth daily. 30 capsule 2  . dofetilide (TIKOSYN) 500 MCG capsule Take 1 capsule (500 mcg total) by mouth 2 (two) times daily. 60 capsule 2  . dorzolamide-timolol (COSOPT) 22.3-6.8 MG/ML ophthalmic solution Place 1 drop into both eyes daily.    . famotidine (PEPCID) 20 MG tablet Take 20 mg by mouth at bedtime.    . furosemide (LASIX) 40 MG tablet Take 1 tablet (40 mg total) by mouth daily as needed (for worsening  swelling or weight gain of 3lbs or more). 30 tablet 1  . gabapentin (NEURONTIN) 300 MG capsule Take 300 mg by mouth 3 (three) times daily.     Marland Kitchen lactobacillus acidophilus (BACID) TABS tablet Take 1 tablet by mouth daily.     Marland Kitchen latanoprost (XALATAN) 0.005 % ophthalmic solution Place 1 drop into both eyes at bedtime.   6  . losartan (COZAAR) 25 MG tablet Take 1 tablet (25 mg total) by mouth daily. 90 tablet 3  . Multiple Vitamin (MULTIVITAMIN WITH MINERALS) TABS tablet Take 1 tablet by mouth daily.    . NON FORMULARY Apply 1 application topically 2 (two) times daily as needed (for eczema). Traimcinolone/[employer] Moist Cream    . omeprazole (PRILOSEC) 40 MG capsule Take 40 mg by mouth daily.     Marland Kitchen oxybutynin (DITROPAN-XL) 10 MG 24 hr tablet Take 10 mg by mouth daily.     . polyethylene glycol (MIRALAX / GLYCOLAX) 17 g packet Take 17 g by mouth daily as needed for moderate constipation.     . potassium chloride (KLOR-CON) 10 MEQ tablet Take 1 tablet (10 mEq total) by mouth daily as needed (if you need to take Lasix). 30 tablet 2  . PRESCRIPTION MEDICATION Inhale into the lungs at bedtime. CPAP    . warfarin (COUMADIN) 2.5 MG tablet Take 1 tablet (2.5 mg total) by mouth daily at 6 PM. Take 1 tablet by mouth daily or as directed by coumadin clinic 100 tablet 1  . warfarin (COUMADIN) 5 MG tablet TAKE 1 TABLET BY MOUTH DAILY OR AS DIRECTED BY COUMADIN CLINIC 90 tablet 0   No current facility-administered medications for this visit.    Allergies:   Amoxicillin, Metoprolol tartrate, and Oxaprozin   Social History:  The patient  reports that she has never smoked. She has never used smokeless tobacco. She reports current alcohol use. She reports that she does not use drugs.   Family History:  The patient's family history includes Diabetes in her brother; Heart attack in her mother; Heart failure in her maternal grandfather; Hypertension in her brother; Kidney disease in her brother.    ROS:  Please see the  history of present illness.   Otherwise, review of systems is positive for none.   All other systems are reviewed and negative.   PHYSICAL EXAM: VS:  BP 130/70   Pulse (!) 56   Ht 5' (1.524 m)   Wt 153 lb 3.2 oz (69.5 kg)   SpO2 96%   BMI 29.92 kg/m  , BMI Body mass index is 29.92 kg/m. GEN: Well nourished, well developed, in no acute distress  HEENT: normal  Neck: no JVD, carotid bruits, or masses Cardiac: RRR; no murmurs, rubs, or gallops,no edema  Respiratory:  clear to auscultation bilaterally, normal work of breathing GI: soft, nontender, nondistended, +  BS MS: no deformity or atrophy  Skin: warm and dry Neuro:  Strength and sensation are intact Psych: euthymic mood, full affect  EKG:  EKG is ordered today. Personal review of the ekg ordered shows sinus rhythm, rate 56, QTc 482 ms  Recent Labs: 01/22/2019: Hemoglobin 12.0; Platelets 355 01/28/2019: ALT 13 02/05/2019: Magnesium 2.1 02/15/2019: BUN 10; Creatinine, Ser 0.79; Potassium 4.3; Sodium 141    Lipid Panel  No results found for: CHOL, TRIG, HDL, CHOLHDL, VLDL, LDLCALC, LDLDIRECT   Wt Readings from Last 3 Encounters:  03/06/19 153 lb 3.2 oz (69.5 kg)  02/07/19 150 lb 6.4 oz (68.2 kg)  02/05/19 151 lb 3.2 oz (68.6 kg)      Other studies Reviewed: Additional studies/ records that were reviewed today include: TTE 01/23/19  Review of the above records today demonstrates:  1. Left ventricular ejection fraction, by visual estimation, is 55 to  60%. The left ventricle has normal function. Left ventricular septal wall  thickness was mildly increased. Moderately increased left ventricular  posterior wall thickness. There is  mildly increased left ventricular hypertrophy.  2. Left ventricular diastolic parameters are indeterminate.  3. The left ventricle demonstrates regional wall motion abnormalities.  4. Elevated LVEDP. Hypokinesis of the basal septum consistent with  post-operative state.  5. Global right  ventricle has normal systolic function.The right  ventricular size is normal. No increase in right ventricular wall  thickness.  6. Left atrial size was severely dilated.  7. Right atrial size was normal.  8. The mitral valve has been repaired/replaced. No evidence of mitral  valve regurgitation. No evidence of mitral stenosis.  9. The tricuspid valve is normal in structure.  10. The aortic valve is tricuspid. Aortic valve regurgitation is mild. No  evidence of aortic valve sclerosis or stenosis.  11. The pulmonic valve was normal in structure. Pulmonic valve  regurgitation is not visualized.  12. Normal pulmonary artery systolic pressure.  13. The inferior vena cava is normal in size with <50% respiratory  variability, suggesting right atrial pressure of 8 mmHg.    ASSESSMENT AND PLAN:  1.  Persistent atrial fibrillation/flutter: Status post ablation 03/10/2017 and 06/24/2017.  CHA2DS2-VASc of 3.  Now status post surgical maze.  Currently on Coumadin, dofetilide, carvedilol, diltiazem.  She is bradycardic and dizzy at times.  We Hien Cunliffe stop her diltiazem today.  2. Pericardial effusion: Now she is postop.  Feeling well.  Has never had tamponade.  3. Hypertension: Currently well controlled  4.  Mitral stenosis: Status post MVR with bioprosthetic mitral valve and maze.  Stable on most recent echo.  No changes.    Current medicines are reviewed at length with the patient today.   The patient does not have concerns regarding her medicines.  The following changes were made today: none  Labs/ tests ordered today include:  No orders of the defined types were placed in this encounter.    Disposition:   FU with Basia Mcginty 6 months  Signed, Cirilo Canner Meredith Leeds, MD  03/06/2019 2:24 PM     [place] [address] [place] [address] [phone] (office) [phone] (fax)

## 2019-03-15 ENCOUNTER — Other Ambulatory Visit: Payer: Self-pay | Admitting: Student

## 2019-03-16 ENCOUNTER — Telehealth (HOSPITAL_COMMUNITY): Payer: Self-pay

## 2019-03-16 NOTE — Telephone Encounter (Signed)
Pt insurance is active and benefits verified through [facility] Medicare Co-pay 0, DED $200/$200 met, out of pocket $2,200/$200 met, co-insurance 0%. no pre-authorization required. Passport, 03/16/2019_0 :25pm, REF# [id number]  Will contact patient to see if she is interested in the Cardiac Rehab Program. If interested, patient will need to complete follow up appt. Once completed, patient will be contacted for scheduling upon review by the RN Navigator.

## 2019-03-16 NOTE — Telephone Encounter (Signed)
Called patient to see if she was interested in participating in the Cardiac Rehab Program. Patient stated yes. Patient will come in for orientation on 03/22/2019@2 :00pm and will attend the 1:45pm exercise class.  Mailed homework package.

## 2019-03-20 ENCOUNTER — Telehealth (HOSPITAL_COMMUNITY): Payer: Self-pay | Admitting: Pharmacist

## 2019-03-20 NOTE — Telephone Encounter (Signed)
Cardiac Rehab Medication Review by a Pharmacist  Does the patient  feel that his/her medications are working for him/her?  yes  Has the patient been experiencing any side effects to the medications prescribed?  no  Does the patient measure his/her own blood pressure or blood glucose at home?  yes  Average BP = 120-130s/60-70s   Does the patient have any problems obtaining medications due to transportation or finances?   no  Understanding of regimen: excellent Understanding of indications: excellent Potential of compliance: excellent  Pharmacist comments: Patient received the second dose of COVID vaccine two days ago and began having chest palpitations and a BP readings 160s/70s. Patient reports today that symptoms have now resolved after 48 hours.   Lorel Monaco, PharmD PGY1 Ambulatory Care Resident [employer] # [phone]

## 2019-03-21 ENCOUNTER — Other Ambulatory Visit: Payer: Self-pay

## 2019-03-21 ENCOUNTER — Encounter (HOSPITAL_COMMUNITY)
Admission: RE | Admit: 2019-03-21 | Discharge: 2019-03-21 | Disposition: A | Discharge status: Home / Self Care | Payer: Self-pay | Source: Ambulatory Visit | Attending: Cardiovascular Disease | Admitting: Cardiovascular Disease

## 2019-03-21 DIAGNOSIS — Z9889 Other specified postprocedural states: Secondary | ICD-10-CM | POA: Insufficient documentation

## 2019-03-21 DIAGNOSIS — Z8679 Personal history of other diseases of the circulatory system: Secondary | ICD-10-CM | POA: Insufficient documentation

## 2019-03-21 NOTE — Progress Notes (Signed)
Cardiac Rehab Note:  Successful telephone encounter to Mary Farrell to confirm Cardiac Rehab orientation appointment for 03/21/19 at 2:00. Nursing assessment completed. Instructions for appointment provided. Patient screening for Covid-19 negative. Patient will be introduced to VCR during [place] orientation.  Dianah Pruett E. Rollene Rotunda RN, BSN [place]. [facility]  Cardiac and Pulmonary Rehabilitation [place] Direct: [phone]

## 2019-03-22 ENCOUNTER — Encounter (HOSPITAL_COMMUNITY): Payer: Self-pay

## 2019-03-22 ENCOUNTER — Encounter (HOSPITAL_COMMUNITY)
Admission: RE | Admit: 2019-03-22 | Discharge: 2019-03-22 | Disposition: A | Discharge status: Home / Self Care | Payer: Medicare Other | Source: Ambulatory Visit | Attending: Cardiovascular Disease | Admitting: Cardiovascular Disease

## 2019-03-22 VITALS — BP 154/70 | HR 73 | Temp 97.5°F | Ht 59.75 in | Wt 157.4 lb

## 2019-03-22 DIAGNOSIS — Z9889 Other specified postprocedural states: Secondary | ICD-10-CM | POA: Insufficient documentation

## 2019-03-22 DIAGNOSIS — Z8679 Personal history of other diseases of the circulatory system: Secondary | ICD-10-CM | POA: Insufficient documentation

## 2019-03-22 NOTE — Progress Notes (Signed)
Cardiac Individual Treatment Plan  Patient Details  Name: Mary Farrell MRN: [id number] Date of Birth: 30-Aug-1945 Referring Provider:     [place] from 03/22/2019 in [place]  Referring Provider  Skeet Latch MD      Initial Encounter Date:    CARDIAC REHAB PHASE II ORIENTATION from 03/22/2019 in [place]  Date  03/22/19      Visit Diagnosis: S/P minimally-invasive maze operation for atrial fibrillation  Patient's Home Medications on Admission:  Current Outpatient Medications:  .  acetaminophen (TYLENOL) 500 MG tablet, Take 1,000 mg by mouth every 6 (six) hours as needed for moderate pain or headache. , Disp: , Rfl:  .  ALPRAZolam (XANAX) 0.25 MG tablet, Take 0.125 mg by mouth 2 (two) times daily as needed for anxiety. , Disp: , Rfl:  .  aspirin EC 81 MG EC tablet, Take 1 tablet (81 mg total) by mouth daily., Disp: , Rfl:  .  carvedilol (COREG) 12.5 MG tablet, Take 1 tablet (12.5 mg total) by mouth 2 (two) times daily with a meal., Disp: 60 tablet, Rfl: 2 .  cetirizine (ZYRTEC) 10 MG tablet, Take 10 mg by mouth at bedtime. , Disp: , Rfl:  .  Cholecalciferol (VITAMIN D) 2000 UNITS CAPS, Take 2,000 Units by mouth daily.  , Disp: , Rfl:  .  conjugated estrogens (PREMARIN) vaginal cream, Place 1 Applicatorful vaginally daily as needed (irritation). , Disp: , Rfl:  .  dofetilide (TIKOSYN) 500 MCG capsule, Take 1 capsule (500 mcg total) by mouth 2 (two) times daily., Disp: 60 capsule, Rfl: 2 .  dorzolamide-timolol (COSOPT) 22.3-6.8 MG/ML ophthalmic solution, Place 1 drop into both eyes daily., Disp: , Rfl:  .  famotidine (PEPCID) 20 MG tablet, Take 20 mg by mouth at bedtime., Disp: , Rfl:  .  furosemide (LASIX) 40 MG tablet, Take 1 tablet (40 mg total) by mouth daily as needed (for worsening swelling or weight gain of 3lbs or more)., Disp: 30 tablet, Rfl: 1 .  gabapentin (NEURONTIN) 300 MG  capsule, Take 300 mg by mouth 3 (three) times daily. , Disp: , Rfl:  .  lactobacillus acidophilus (BACID) TABS tablet, Take 1 tablet by mouth daily. , Disp: , Rfl:  .  latanoprost (XALATAN) 0.005 % ophthalmic solution, Place 1 drop into both eyes at bedtime. , Disp: , Rfl: 6 .  losartan (COZAAR) 25 MG tablet, Take 1 tablet (25 mg total) by mouth daily., Disp: 90 tablet, Rfl: 3 .  Multiple Vitamin (MULTIVITAMIN WITH MINERALS) TABS tablet, Take 1 tablet by mouth daily., Disp: , Rfl:  .  NON FORMULARY, Apply 1 application topically 2 (two) times daily as needed (for eczema). Traimcinolone/[facility] Moist Cream, Disp: , Rfl:  .  omeprazole (PRILOSEC) 40 MG capsule, Take 40 mg by mouth daily. , Disp: , Rfl:  .  oxybutynin (DITROPAN-XL) 10 MG 24 hr tablet, Take 10 mg by mouth daily. , Disp: , Rfl:  .  polyethylene glycol (MIRALAX / GLYCOLAX) 17 g packet, Take 17 g by mouth daily as needed for moderate constipation. , Disp: , Rfl:  .  potassium chloride (KLOR-CON) 10 MEQ tablet, Take 1 tablet (10 mEq total) by mouth daily as needed (if you need to take Lasix)., Disp: 30 tablet, Rfl: 2 .  PRESCRIPTION MEDICATION, Inhale into the lungs at bedtime. CPAP, Disp: , Rfl:  .  warfarin (COUMADIN) 2.5 MG tablet, Take 1 tablet (2.5 mg total) by  mouth daily at 6 PM. Take 1 tablet by mouth daily or as directed by coumadin clinic, Disp: 100 tablet, Rfl: 1 .  warfarin (COUMADIN) 5 MG tablet, TAKE 1 TABLET BY MOUTH DAILY OR AS DIRECTED BY COUMADIN CLINIC, Disp: 90 tablet, Rfl: 0  Past Medical History: Past Medical History:  Diagnosis Date  . Anxiety 06/29/2012  . Aortic regurgitation 08/13/2015  . Arthritis    "hands, wrists, back, feet, toes" (06/24/2017)  . Atrial flutter ([place]) 09/18/2015  . Atypical chest pain 05/13/2016  . Chest pain    remote cath in 1996 with NORMAL coronaries noted  . CHF (congestive heart failure) ([place])   . Dyspnea 10/30/2010  . Essential hypertension 09/05/2013  . Exogenous obesity    history of    . GERD (gastroesophageal reflux disease)   . Glaucoma, both eyes   . Headache, migraine    "stopped in my 50's" (09/18/2015)  . History of cardiovascular stress test 2007   showing no ischemia  . Hypertension   . Mitral stenosis and aortic insufficiency 06/23/2010  . Mitral stenosis with insufficiency   . Mitral stenosis with regurgitation   . Murmur    of mitral stenosis and moderate aortic insufficiency      . Nausea 05/01/2018  . OSA on CPAP   . Palpitations    occasional  . Paroxysmal A-fib ([place]) 06/24/2017  . Pericardial effusion 09/18/2015  . Persistent atrial fibrillation ([place]) 03/10/2017  . Personal history of rheumatic heart disease   . S/P minimally-invasive maze operation for atrial fibrillation 12/13/2018   Complete bilateral atrial lesion set using cryothermy and bipolar radiofrequency ablation with clipping of LA appendage via right mini-thoracotomy approach  . S/P minimally-invasive mitral valve replacement with bioprosthetic valve 12/13/2018   31 mm [facility] Mitral stented bovine pericardial tissue valve  . SBE (subacute bacterial endocarditis)    prophylaxis, patient unaware  . Sliding hiatal hernia   . Vertigo 05/01/2018    Tobacco Use: Social History   Tobacco Use  Smoking Status Never Smoker  Smokeless Tobacco Never Used    Labs: Recent Review Flowsheet Data    Labs for ITP Cardiac and Pulmonary Rehab Latest Ref Rng & Units 12/13/2018 12/13/2018 12/13/2018 12/13/2018 12/14/2018   Hemoglobin A1c 4.8 - 5.6 % - - - - -   PHART 7.350 - 7.450 - 7.495(H) 7.515(H) 7.348(L) 7.372   PCO2ART 32.0 - 48.0 mmHg - 33.9 28.5(L) 42.0 38.1   HCO3 20.0 - 28.0 mmol/L - 26.2 23.4 23.1 22.0   TCO2 22 - 32 mmol/L '26 27 24 24 23   ' ACIDBASEDEF 0.0 - 2.0 mmol/L - - - 2.0 3.0(H)   O2SAT % - 100.0 100.0 99.0 99.0      Capillary Blood Glucose: Lab Results  Component Value Date   GLUCAP 94 01/28/2019   GLUCAP 122 (H) 12/16/2018   GLUCAP 95 12/16/2018   GLUCAP 105 (H)  12/15/2018   GLUCAP 91 12/15/2018     Exercise Target Goals: Exercise Program Goal: Individual exercise prescription set using results from initial 6 min walk test and THRR while considering  patient's activity barriers and safety.   Exercise Prescription Goal: Initial exercise prescription builds to 30-45 minutes a day of aerobic activity, 2-3 days per week.  Home exercise guidelines will be given to patient during program as part of exercise prescription that the participant will acknowledge.  Activity Barriers & Risk Stratification: Activity Barriers & Cardiac Risk Stratification - 03/22/19 1517  Activity Barriers & Cardiac Risk Stratification   Activity Barriers  Arthritis;Joint Problems;Deconditioning;Balance Concerns    Cardiac Risk Stratification  High       6 Minute Walk: 6 Minute Walk    Row Name 03/22/19 1515         6 Minute Walk   Phase  Initial     Distance  970 feet     Walk Time  6 minutes     # of Rest Breaks  0     MPH  1.84     METS  1.79     RPE  12     Perceived Dyspnea   0     VO2 Peak  6.3     Symptoms  No     Resting HR  73 bpm     Resting BP  154/70     Resting Oxygen Saturation   96 %     Exercise Oxygen Saturation  during 6 min walk  96 %     Max Ex. HR  93 bpm     Max Ex. BP  148/72     2 Minute Post BP  128/76        Oxygen Initial Assessment:   Oxygen Re-Evaluation:   Oxygen Discharge (Final Oxygen Re-Evaluation):   Initial Exercise Prescription: Initial Exercise Prescription - 03/22/19 1500      Date of Initial Exercise RX and Referring Provider   Date  03/22/19    Referring Provider  Skeet Latch MD    Expected Discharge Date  05/18/19      NuStep   Level  2    SPM  75    Minutes  30    METs  2      Prescription Details   Frequency (times per week)  3x    Duration  Progress to 10 minutes continuous walking  at current work load and total walking time to 30-45 min      Intensity   THRR 40-80% of Max  Heartrate  58-116    Ratings of Perceived Exertion  11-13    Perceived Dyspnea  0-4      Progression   Progression  Continue progressive overload as per policy without signs/symptoms or physical distress.      Resistance Training   Training Prescription  Yes    Weight  2lbs    Reps  10-15       Perform Capillary Blood Glucose checks as needed.  Exercise Prescription Changes:   Exercise Comments:   Exercise Goals and Review:  Exercise Goals    Row Name 03/22/19 1517             Exercise Goals   Increase Physical Activity  Yes       Intervention  Provide advice, education, support and counseling about physical activity/exercise needs.;Develop an individualized exercise prescription for aerobic and resistive training based on initial evaluation findings, risk stratification, comorbidities and participant's personal goals.       Expected Outcomes  Short Term: Attend rehab on a regular basis to increase amount of physical activity.;Long Term: Add in home exercise to make exercise part of routine and to increase amount of physical activity.;Long Term: Exercising regularly at least 3-5 days a week.       Increase Strength and Stamina  Yes       Intervention  Provide advice, education, support and counseling about physical activity/exercise needs.;Develop an individualized exercise prescription for aerobic and resistive training based  on initial evaluation findings, risk stratification, comorbidities and participant's personal goals.       Expected Outcomes  Short Term: Increase workloads from initial exercise prescription for resistance, speed, and METs.;Short Term: Perform resistance training exercises routinely during rehab and add in resistance training at home;Long Term: Improve cardiorespiratory fitness, muscular endurance and strength as measured by increased METs and functional capacity (6MWT)       Able to understand and use rate of perceived exertion (RPE) scale  Yes        Intervention  Provide education and explanation on how to use RPE scale       Expected Outcomes  Short Term: Able to use RPE daily in rehab to express subjective intensity level;Long Term:  Able to use RPE to guide intensity level when exercising independently       Knowledge and understanding of Target Heart Rate Range (THRR)  Yes       Intervention  Provide education and explanation of THRR including how the numbers were predicted and where they are located for reference       Expected Outcomes  Short Term: Able to state/look up THRR;Long Term: Able to use THRR to govern intensity when exercising independently;Short Term: Able to use daily as guideline for intensity in rehab       Able to check pulse independently  Yes       Intervention  Provide education and demonstration on how to check pulse in carotid and radial arteries.;Review the importance of being able to check your own pulse for safety during independent exercise       Expected Outcomes  Short Term: Able to explain why pulse checking is important during independent exercise;Long Term: Able to check pulse independently and accurately       Understanding of Exercise Prescription  Yes       Intervention  Provide education, explanation, and written materials on patient's individual exercise prescription       Expected Outcomes  Short Term: Able to explain program exercise prescription;Long Term: Able to explain home exercise prescription to exercise independently          Exercise Goals Re-Evaluation :   Discharge Exercise Prescription (Final Exercise Prescription Changes):   Nutrition:  Target Goals: Understanding of nutrition guidelines, daily intake of sodium 1500mg , cholesterol 200mg , calories 30% from fat and 7% or less from saturated fats, daily to have 5 or more servings of fruits and vegetables.  Biometrics: Pre Biometrics - 03/22/19 1516      Pre Biometrics   Height  4' 11.75" (1.518 m)    Weight  71.4 kg    Waist  Circumference  36 inches    Hip Circumference  41 inches    Waist to Hip Ratio  0.88 %    BMI (Calculated)  30.99    Triceps Skinfold  28 mm    % Body Fat  42.4 %    Grip Strength  22 kg    Flexibility  13 in    Single Leg Stand  4.56 seconds        Nutrition Therapy Plan and Nutrition Goals:   Nutrition Assessments:   Nutrition Goals Re-Evaluation:   Nutrition Goals Re-Evaluation:   Nutrition Goals Discharge (Final Nutrition Goals Re-Evaluation):   Psychosocial: Target Goals: Acknowledge presence or absence of significant depression and/or stress, maximize coping skills, provide positive support system. Participant is able to verbalize types and ability to use techniques and skills needed for reducing stress and depression.  Initial Review & Psychosocial Screening: Initial Psych Review & Screening - 03/22/19 [place]?  Yes    Comments  Ms. Forcum has a positive attitude and outlook. She is excited about participating in CR and bettering her health. She is married and has 3 adult children. She admits the stress in her life is more managable since her son has been released from prision. States he is doing very well as a productive member of society. She denies psychosocial barriers to participation in CR or self health management. No interventions needed at this time.      Barriers   Psychosocial barriers to participate in program  There are no identifiable barriers or psychosocial needs.      Screening Interventions   Interventions  Encouraged to exercise       Quality of Life Scores:  Scores of 19 and below usually indicate a poorer quality of life in these areas.  A difference of  2-3 points is a clinically meaningful difference.  A difference of 2-3 points in the total score of the Quality of Life Index has been associated with significant improvement in overall quality of life, self-image, physical symptoms, and general health  in studies assessing change in quality of life.  PHQ-9: Recent Review Flowsheet Data    Depression screen [facility] 2/9 03/22/2019 06/07/2016   Decreased Interest 0 0   Down, Depressed, Hopeless 0 0   PHQ - 2 Score 0 0     Interpretation of Total Score  Total Score Depression Severity:  1-4 = Minimal depression, 5-9 = Mild depression, 10-14 = Moderate depression, 15-19 = Moderately severe depression, 20-27 = Severe depression   Psychosocial Evaluation and Intervention:   Psychosocial Re-Evaluation:   Psychosocial Discharge (Final Psychosocial Re-Evaluation):   Vocational Rehabilitation: Provide vocational rehab assistance to qualifying candidates.   Vocational Rehab Evaluation & Intervention: Vocational Rehab - 03/22/19 1451      Initial Vocational Rehab Evaluation & Intervention   Assessment shows need for Vocational Rehabilitation  No       Education: Education Goals: Education classes will be provided on a weekly basis, covering required topics. Participant will state understanding/return demonstration of topics presented.  Learning Barriers/Preferences: Learning Barriers/Preferences - 03/22/19 1521      Learning Barriers/Preferences   Learning Barriers  Sight    Learning Preferences  Skilled Demonstration;Written Material       Education Topics: Count Your Pulse:  -Group instruction provided by verbal instruction, demonstration, patient participation and written materials to support subject.  Instructors address importance of being able to find your pulse and how to count your pulse when at home without a heart monitor.  Patients get hands on experience counting their pulse with staff help and individually.   Heart Attack, Angina, and Risk Factor Modification:  -Group instruction provided by verbal instruction, video, and written materials to support subject.  Instructors address signs and symptoms of angina and heart attacks.    Also discuss risk factors for heart  disease and how to make changes to improve heart health risk factors.   Functional Fitness:  -Group instruction provided by verbal instruction, demonstration, patient participation, and written materials to support subject.  Instructors address safety measures for doing things around the house.  Discuss how to get up and down off the floor, how to pick things up properly, how to safely get out of a chair without assistance, and balance training.   Meditation  and Mindfulness:  -Group instruction provided by verbal instruction, patient participation, and written materials to support subject.  Instructor addresses importance of mindfulness and meditation practice to help reduce stress and improve awareness.  Instructor also leads participants through a meditation exercise.    Stretching for Flexibility and Mobility:  -Group instruction provided by verbal instruction, patient participation, and written materials to support subject.  Instructors lead participants through series of stretches that are designed to increase flexibility thus improving mobility.  These stretches are additional exercise for major muscle groups that are typically performed during regular warm up and cool down.   Hands Only CPR:  -Group verbal, video, and participation provides a basic overview of AHA guidelines for community CPR. Role-play of emergencies allow participants the opportunity to practice calling for help and chest compression technique with discussion of AED use.   Hypertension: -Group verbal and written instruction that provides a basic overview of hypertension including the most recent diagnostic guidelines, risk factor reduction with self-care instructions and medication management.    Nutrition I class: Heart Healthy Eating:  -Group instruction provided by PowerPoint slides, verbal discussion, and written materials to support subject matter. The instructor gives an explanation and review of the  Therapeutic Lifestyle Changes diet recommendations, which includes a discussion on lipid goals, dietary fat, sodium, fiber, plant stanol/sterol esters, sugar, and the components of a well-balanced, healthy diet.   Nutrition II class: Lifestyle Skills:  -Group instruction provided by PowerPoint slides, verbal discussion, and written materials to support subject matter. The instructor gives an explanation and review of label reading, grocery shopping for heart health, heart healthy recipe modifications, and ways to make healthier choices when eating out.   Diabetes Question & Answer:  -Group instruction provided by PowerPoint slides, verbal discussion, and written materials to support subject matter. The instructor gives an explanation and review of diabetes co-morbidities, pre- and post-prandial blood glucose goals, pre-exercise blood glucose goals, signs, symptoms, and treatment of hypoglycemia and hyperglycemia, and foot care basics.   Diabetes Blitz:  -Group instruction provided by PowerPoint slides, verbal discussion, and written materials to support subject matter. The instructor gives an explanation and review of the physiology behind type 1 and type 2 diabetes, diabetes medications and rational behind using different medications, pre- and post-prandial blood glucose recommendations and Hemoglobin A1c goals, diabetes diet, and exercise including blood glucose guidelines for exercising safely.    Portion Distortion:  -Group instruction provided by PowerPoint slides, verbal discussion, written materials, and food models to support subject matter. The instructor gives an explanation of serving size versus portion size, changes in portions sizes over the last 20 years, and what consists of a serving from each food group.   Stress Management:  -Group instruction provided by verbal instruction, video, and written materials to support subject matter.  Instructors review role of stress in heart  disease and how to cope with stress positively.     Exercising on Your Own:  -Group instruction provided by verbal instruction, power point, and written materials to support subject.  Instructors discuss benefits of exercise, components of exercise, frequency and intensity of exercise, and end points for exercise.  Also discuss use of nitroglycerin and activating EMS.  Review options of places to exercise outside of rehab.  Review guidelines for sex with heart disease.   Cardiac Drugs I:  -Group instruction provided by verbal instruction and written materials to support subject.  Instructor reviews cardiac drug classes: antiplatelets, anticoagulants, beta blockers, and statins.  Instructor discusses  reasons, side effects, and lifestyle considerations for each drug class.   Cardiac Drugs II:  -Group instruction provided by verbal instruction and written materials to support subject.  Instructor reviews cardiac drug classes: angiotensin converting enzyme inhibitors (ACE-I), angiotensin II receptor blockers (ARBs), nitrates, and calcium channel blockers.  Instructor discusses reasons, side effects, and lifestyle considerations for each drug class.   Anatomy and Physiology of the Circulatory System:  Group verbal and written instruction and models provide basic cardiac anatomy and physiology, with the coronary electrical and arterial systems. Review of: AMI, Angina, Valve disease, Heart Failure, Peripheral Artery Disease, Cardiac Arrhythmia, Pacemakers, and the ICD.   Other Education:  -Group or individual verbal, written, or video instructions that support the educational goals of the cardiac rehab program.   Holiday Eating Survival Tips:  -Group instruction provided by PowerPoint slides, verbal discussion, and written materials to support subject matter. The instructor gives patients tips, tricks, and techniques to help them not only survive but enjoy the holidays despite the onslaught of food  that accompanies the holidays.   Knowledge Questionnaire Score:   Core Components/Risk Factors/Patient Goals at Admission: Personal Goals and Risk Factors at Admission - 03/22/19 1520      Core Components/Risk Factors/Patient Goals on Admission   Hypertension  Yes    Intervention  Provide education on lifestyle modifcations including regular physical activity/exercise, weight management, moderate sodium restriction and increased consumption of fresh fruit, vegetables, and low fat dairy, alcohol moderation, and smoking cessation.;Monitor prescription use compliance.    Expected Outcomes  Short Term: Continued assessment and intervention until BP is < 140/21mm HG in hypertensive participants. < 130/40mm HG in hypertensive participants with diabetes, heart failure or chronic kidney disease.;Long Term: Maintenance of blood pressure at goal levels.       Core Components/Risk Factors/Patient Goals Review:    Core Components/Risk Factors/Patient Goals at Discharge (Final Review):    ITP Comments: ITP Comments    Row Name 03/22/19 1406           ITP Comments  Dr. Fransico Him, Medical Director [facility] Cardiac Rehab          Comments: Patient attended orientation on 03/22/2019 to review rules and guidelines for program.  Completed 6 minute walk test, Intitial ITP, and exercise prescription.  VSS. Telemetry-Junctional.  Asymptomatic. Safety measures and social distancing in place per CDC guidelines.

## 2019-03-24 ENCOUNTER — Other Ambulatory Visit: Payer: Self-pay | Admitting: Thoracic Surgery (Cardiothoracic Vascular Surgery)

## 2019-03-26 ENCOUNTER — Other Ambulatory Visit: Payer: Self-pay

## 2019-03-26 ENCOUNTER — Ambulatory Visit
Admission: RE | Admit: 2019-03-26 | Discharge: 2019-03-26 | Disposition: A | Discharge status: Home / Self Care | Payer: Medicare Other | Source: Ambulatory Visit | Attending: Thoracic Surgery (Cardiothoracic Vascular Surgery) | Admitting: Thoracic Surgery (Cardiothoracic Vascular Surgery)

## 2019-03-26 ENCOUNTER — Encounter (HOSPITAL_COMMUNITY): Payer: Medicare Other

## 2019-03-26 ENCOUNTER — Ambulatory Visit: Payer: Self-pay | Admitting: Thoracic Surgery (Cardiothoracic Vascular Surgery)

## 2019-03-26 ENCOUNTER — Ambulatory Visit (INDEPENDENT_AMBULATORY_CARE_PROVIDER_SITE_OTHER): Payer: Medicare Other | Admitting: Thoracic Surgery (Cardiothoracic Vascular Surgery)

## 2019-03-26 ENCOUNTER — Encounter: Payer: Self-pay | Admitting: Thoracic Surgery (Cardiothoracic Vascular Surgery)

## 2019-03-26 VITALS — BP 158/77 | HR 76 | Temp 97.3°F | Resp 20 | Ht 60.0 in | Wt 154.0 lb

## 2019-03-26 DIAGNOSIS — Z953 Presence of xenogenic heart valve: Secondary | ICD-10-CM

## 2019-03-26 DIAGNOSIS — Z8679 Personal history of other diseases of the circulatory system: Secondary | ICD-10-CM

## 2019-03-26 NOTE — Patient Instructions (Signed)

## 2019-03-26 NOTE — Progress Notes (Signed)
[address]       [place] [address]             [phone]     CARDIOTHORACIC SURGERY OFFICE NOTE  Primary Cardiologist is Skeet Latch, MD PCP is Lawerance Cruel, MD   HPI:  Patient is a 74 year old female with rheumatic heart disease including mitral stenosis, aortic insufficiency, and recurrent paroxysmal atrial fibrillation and atrial flutter who underwent minimally invasive mitral valve replacement using a bioprosthetic tissue valve and maze procedure on December 13, 2018.  The patient's early postoperative recovery was uncomplicated although notable for the development of postoperative atrial fibrillation.  She was last seen in our office on January 08, 2019 at which time she was doing fairly well.  She was briefly hospitalized the end of December with an episode of SVT and atrial fibrillation.  She was loaded with Tikosyn and underwent DC cardioversion January 24, 2019.  Transthoracic echocardiogram performed at that time revealed normal left ventricular function with normal functioning bioprosthetic tissue valve in the mitral position and mild aortic insufficiency.  Since then she has done well.  She was last seen by Dr. Curt Bears in the office on March 06, 2019 at which time she was maintaining sinus rhythm.  Oral diltiazem was stopped at that time.  Patient returns the office today now more than 3 months status post minimally invasive mitral valve replacement and Maze procedure.  She reports that overall she is doing quite well.  She still gets some exertional shortness of breath but overall she reports that her exercise tolerance and breathing is much better than it was prior to surgery.  She plans to start the cardiac rehab program later this week as her participation has been delayed because of the COVID-19 pandemic.  She no longer has any significant pain or soreness in her chest although she reports some numbness across to her surgical incision and  she is reluctantly looking forward to having a routine mammogram performed.  She states that since she underwent cardioversion on January 24, 2019 she has only had one brief episode where she had palpitation associated with tachycardia that lasted only a few seconds, and this was in early January.  She has not had any palpitations since.  She remains anticoagulated using Coumadin and low-dose aspirin and she is taking Tikosyn.  She is interested in stopping Coumadin and going back on Eliquis for long-term anticoagulation.   Current Outpatient Medications  Medication Sig Dispense Refill  . acetaminophen (TYLENOL) 500 MG tablet Take 1,000 mg by mouth every 6 (six) hours as needed for moderate pain or headache.     . ALPRAZolam (XANAX) 0.25 MG tablet Take 0.125 mg by mouth 2 (two) times daily as needed for anxiety.     Marland Kitchen aspirin EC 81 MG EC tablet Take 1 tablet (81 mg total) by mouth daily.    . carvedilol (COREG) 12.5 MG tablet Take 1 tablet (12.5 mg total) by mouth 2 (two) times daily with a meal. 60 tablet 2  . cetirizine (ZYRTEC) 10 MG tablet Take 10 mg by mouth at bedtime.     . Cholecalciferol (VITAMIN D) 2000 UNITS CAPS Take 2,000 Units by mouth daily.      Marland Kitchen conjugated estrogens (PREMARIN) vaginal cream Place 1 Applicatorful vaginally daily as needed (irritation).     . dofetilide (TIKOSYN) 500 MCG capsule Take 1 capsule (500 mcg total) by mouth 2 (two) times daily. 60 capsule 2  .  dorzolamide-timolol (COSOPT) 22.3-6.8 MG/ML ophthalmic solution Place 1 drop into both eyes daily.    . famotidine (PEPCID) 20 MG tablet Take 20 mg by mouth at bedtime.    . furosemide (LASIX) 40 MG tablet Take 1 tablet (40 mg total) by mouth daily as needed (for worsening swelling or weight gain of 3lbs or more). 30 tablet 1  . gabapentin (NEURONTIN) 300 MG capsule Take 300 mg by mouth 3 (three) times daily.     Marland Kitchen lactobacillus acidophilus (BACID) TABS tablet Take 1 tablet by mouth daily.     Marland Kitchen latanoprost  (XALATAN) 0.005 % ophthalmic solution Place 1 drop into both eyes at bedtime.   6  . losartan (COZAAR) 25 MG tablet Take 1 tablet (25 mg total) by mouth daily. 90 tablet 3  . Multiple Vitamin (MULTIVITAMIN WITH MINERALS) TABS tablet Take 1 tablet by mouth daily.    . NON FORMULARY Apply 1 application topically 2 (two) times daily as needed (for eczema). Traimcinolone/[employer] Moist Cream    . omeprazole (PRILOSEC) 40 MG capsule Take 40 mg by mouth daily.     Marland Kitchen oxybutynin (DITROPAN-XL) 10 MG 24 hr tablet Take 10 mg by mouth daily.     . polyethylene glycol (MIRALAX / GLYCOLAX) 17 g packet Take 17 g by mouth daily as needed for moderate constipation.     . potassium chloride (KLOR-CON) 10 MEQ tablet Take 1 tablet (10 mEq total) by mouth daily as needed (if you need to take Lasix). 30 tablet 2  . PRESCRIPTION MEDICATION Inhale into the lungs at bedtime. CPAP    . warfarin (COUMADIN) 2.5 MG tablet Take 1 tablet (2.5 mg total) by mouth daily at 6 PM. Take 1 tablet by mouth daily or as directed by coumadin clinic 100 tablet 1  . warfarin (COUMADIN) 5 MG tablet TAKE 1 TABLET BY MOUTH DAILY OR AS DIRECTED BY COUMADIN CLINIC 90 tablet 0   No current facility-administered medications for this visit.      Physical Exam:   BP (!) 158/77 (BP Location: Right Arm, Patient Position: Sitting, Cuff Size: Normal)   Pulse 76   Temp (!) 97.3 F (36.3 C) (Temporal)   Resp 20   Ht 5' (1.524 m)   Wt 154 lb (69.9 kg)   SpO2 95% Comment: RA  BMI 30.08 kg/m   General:  Well-appearing  Chest:   Clear to auscultation with symmetrical breath sounds  CV:   Regular rate and rhythm without murmur  Incisions:  Completely healed  Abdomen:  Soft nontender  Extremities:  Warm and well-perfused  Diagnostic Tests:  ECHOCARDIOGRAM REPORT       Patient Name:  Mary Farrell Date of Exam: 01/23/2019  Medical Rec #: [id number]  Height:    60.0 in  Accession #:  [id number]  Weight:    152.6 lb  Date of  Birth: [birth year]  BSA:     1.66 m  Patient Age:  23 years   BP:      136/81 mmHg  Patient Gender: F      HR:      107 bpm.  Exam Location: Inpatient   Procedure: 2D Echo, Color Doppler and Cardiac Doppler   Indications:  R00.2 Palpitations    History:    Patient has prior history of Echocardiogram examinations,  most         recent 12/13/2018. CHF, Arrythmias:Atrial Fibrillation;  Risk         Factors:Sleep Apnea and Hypertension. 12/13/18 Mitral  Valve         replaced with 87mm Magna Mitral Ease and LAA clipped.    Sonographer:  Raquel Sarna Senior RDCS  Referring Phys: [id number] [place]    1. Left ventricular ejection fraction, by visual estimation, is 55 to  60%. The left ventricle has normal function. Left ventricular septal wall  thickness was mildly increased. Moderately increased left ventricular  posterior wall thickness. There is  mildly increased left ventricular hypertrophy.  2. Left ventricular diastolic parameters are indeterminate.  3. The left ventricle demonstrates regional wall motion abnormalities.  4. Elevated LVEDP. Hypokinesis of the basal septum consistent with  post-operative state.  5. Global right ventricle has normal systolic function.The right  ventricular size is normal. No increase in right ventricular wall  thickness.  6. Left atrial size was severely dilated.  7. Right atrial size was normal.  8. The mitral valve has been repaired/replaced. No evidence of mitral  valve regurgitation. No evidence of mitral stenosis.  9. The tricuspid valve is normal in structure.  10. The aortic valve is tricuspid. Aortic valve regurgitation is mild. No  evidence of aortic valve sclerosis or stenosis.  11. The pulmonic valve was normal in structure. Pulmonic valve  regurgitation is not visualized.  12. Normal pulmonary artery systolic pressure.  13. The inferior vena cava  is normal in size with <50% respiratory  variability, suggesting right atrial pressure of 8 mmHg.   In comparison to the previous echocardiogram(s): Bioprosthetic mitral  valve now present and functioning properly. Mean gradient 5 mmHg.  FINDINGS  Left Ventricle: Left ventricular ejection fraction, by visual estimation,  is 55 to 60%. The left ventricle has normal function. The left ventricle  demonstrates regional wall motion abnormalities. Moderately increased left  ventricular posterior wall  thickness. There is mildly increased left ventricular hypertrophy. Left  ventricular diastolic parameters are indeterminate. Normal left atrial  pressure. Elevated LVEDP. Hypokinesis of the basal septum consistent with  post-operative state.   Right Ventricle: The right ventricular size is normal. No increase in  right ventricular wall thickness. Global RV systolic function is has  normal systolic function. The tricuspid regurgitant velocity is 1.85 m/s,  and with an assumed right atrial pressure  of 8 mmHg, the estimated right ventricular systolic pressure is normal at  21.7 mmHg.   Left Atrium: Left atrial size was severely dilated.   Right Atrium: Right atrial size was normal in size   Pericardium: There is no evidence of pericardial effusion.   Mitral Valve: The mitral valve has been repaired/replaced. No evidence of  mitral valve regurgitation. No evidence of mitral valve stenosis by  observation. MV peak gradient, 10.1 mmHg.   Tricuspid Valve: The tricuspid valve is normal in structure. Tricuspid  valve regurgitation is mild.   Aortic Valve: The aortic valve is tricuspid. Aortic valve regurgitation is  mild. The aortic valve is structurally normal, with no evidence of  sclerosis or stenosis.   Pulmonic Valve: The pulmonic valve was normal in structure. Pulmonic valve  regurgitation is not visualized. Pulmonic regurgitation is not visualized.   Aorta: The aortic root,  ascending aorta and aortic arch are all  structurally normal, with no evidence of dilitation or obstruction.   Venous: The inferior vena cava is normal in size with less than 50%  respiratory variability, suggesting right atrial pressure of 8 mmHg.   IAS/Shunts: No atrial level shunt detected by color flow Doppler. There is  no evidence of a  patent foramen ovale. No ventricular septal defect is  seen or detected. There is no evidence of an atrial septal defect.     LEFT VENTRICLE  PLAX 2D  LVIDd:     3.40 cm Diastology  LVIDs:     2.40 cm LV e' lateral:  7.94 cm/s  LV PW:     1.40 cm LV E/e' lateral: 16.2  LV IVS:    1.20 cm LV e' medial:  6.74 cm/s  LVOT diam:   2.00 cm LV E/e' medial: 19.1  LV SV:     27 ml  LV SV Index:  15.70  LVOT Area:   3.14 cm     RIGHT VENTRICLE  RV S prime:   6.20 cm/s  TAPSE (M-mode): 1.1 cm   LEFT ATRIUM       Index    RIGHT ATRIUM      Index  LA diam:    4.50 cm 2.70 cm/m RA Area:   12.30 cm  LA Vol (A2C):  80.2 ml 48.21 ml/m RA Volume:  20.70 ml 12.44 ml/m  LA Vol (A4C):  61.9 ml 37.21 ml/m  LA Biplane Vol: 74.3 ml 44.66 ml/m  AORTIC VALVE  LVOT Vmax:  130.00 cm/s  LVOT Vmean: 92.200 cm/s  LVOT VTI:  0.243 m    AORTA  Ao Root diam: 2.90 cm  Ao Asc diam: 3.30 cm   MITRAL VALVE             TRICUSPID VALVE  MV Area (PHT): 4.68 cm       TR Peak grad:  13.7 mmHg  MV Peak grad: 10.1 mmHg       TR Vmax:    185.00 cm/s  MV Mean grad: 5.0 mmHg  MV Vmax:    1.59 m/s       SHUNTS  MV Vmean:   102.0 cm/s      Systemic VTI: 0.24 m  MV VTI:    0.24 m        Systemic Diam: 2.00 cm  MV PHT:    46.98 msec  MV Decel Time: 162 msec  MV E velocity: 129.00 cm/s 103 cm/s  MV A velocity: 37.20 cm/s 70.3 cm/s  MV E/A ratio: 3.47    1.5     Skeet Latch MD  Electronically signed by Skeet Latch MD   Signature Date/Time: 01/23/2019/12:17:40 PM      CHEST - 2 VIEW  COMPARISON:  January 22, 2019.  FINDINGS: Stable cardiomediastinal silhouette. Status post mitral valve repair. No pneumothorax or pleural effusion is noted. Minimal right midlung subsegmental atelectasis is noted. Bony thorax is unremarkable.  IMPRESSION: Minimal right midlung subsegmental atelectasis. No other significant abnormality seen in the chest.   Electronically Signed   By: Marijo Conception M.D.   On: 03/26/2019 13:49   2 channel telemetry rhythm strip demonstrates what appears to be sinus rhythm with PACs   Impression:  Patient is doing very well and appears to be maintaining sinus rhythm more than 3 months status post minimally invasive mitral valve replacement using a bioprosthetic tissue valve and maze procedure.    Plan:  We have not recommended any change of the patient's current medications.  It might be reasonable to consider stopping Coumadin and aspirin and restarting Eliquis, but we will defer this decision to the discretion of Dr. Oval Linsey and Dr. Curt Bears.  I have encouraged the patient to continue to increase her physical activity without any particular limitations.  The patient  has been reminded regarding the importance of dental hygiene and the lifelong need for antibiotic prophylaxis for all dental cleanings and other related invasive procedures.  The patient will return to our office for routine follow-up and rhythm check next November.  She will call and return during the interim only should specific problems or questions arise.    I spent in excess of 15 minutes during the conduct of this office consultation and >50% of this time involved direct face-to-face encounter with the patient for counseling and/or coordination of their care.    [name]. Roxy Manns, MD 03/26/2019 2:03 PM

## 2019-03-28 ENCOUNTER — Other Ambulatory Visit: Payer: Self-pay

## 2019-03-28 ENCOUNTER — Encounter (HOSPITAL_COMMUNITY)
Admission: RE | Admit: 2019-03-28 | Discharge: 2019-03-28 | Disposition: A | Discharge status: Home / Self Care | Payer: Medicare Other | Source: Ambulatory Visit | Attending: Cardiovascular Disease | Admitting: Cardiovascular Disease

## 2019-03-28 DIAGNOSIS — Z9889 Other specified postprocedural states: Secondary | ICD-10-CM | POA: Diagnosis present

## 2019-03-28 DIAGNOSIS — Z8679 Personal history of other diseases of the circulatory system: Secondary | ICD-10-CM | POA: Insufficient documentation

## 2019-03-28 NOTE — Progress Notes (Signed)
Daily Session Note  Patient Details  Name: Mary Farrell MRN: [id number] Date of Birth: 01-07-1946 Referring Provider:     [place] from 03/22/2019 in [place]  Referring Provider  Skeet Latch MD      Encounter Date: 03/28/2019  Check In: Session Check In - 03/28/19 1352      Check-In   Supervising physician immediately available to respond to emergencies  [facility] immediately available    Physician(s)  Dr. Durenda Hurt    Location  [facility]    Staff Present  Dorma Russell, MS,ACSM CEP, Exercise Physiologist;Brittany Durene Fruits, BS, ACSM CEP, Exercise Physiologist;Olinty Celesta Aver, MS, ACSM CEP, Exercise Physiologist;Parisha Beaulac Karle Starch, RN, BSN    Virtual Visit  No    Medication changes reported      No    Fall or balance concerns reported     No    Tobacco Cessation  No Change    Warm-up and Cool-down  Performed on first and last piece of equipment    Resistance Training Performed  No    VAD Patient?  No    PAD/SET Patient?  No      Pain Assessment   Currently in Pain?  No/denies    Multiple Pain Sites  No       Capillary Blood Glucose: No results found for this or any previous visit (from the past 24 hour(s)).  Exercise Prescription Changes - 03/28/19 1500      Response to Exercise   Blood Pressure (Admit)  148/86    Blood Pressure (Exercise)  118/72    Blood Pressure (Exit)  120/70    Heart Rate (Admit)  78 bpm    Heart Rate (Exercise)  80 bpm    Heart Rate (Exit)  73 bpm    Rating of Perceived Exertion (Exercise)  12    Symptoms  None    Comments  Pt first day of exercise.     Duration  Continue with 30 min of aerobic exercise without signs/symptoms of physical distress.    Intensity  THRR unchanged      Progression   Progression  Continue to progress workloads to maintain intensity without signs/symptoms of physical distress.    Average METs  1.9      Resistance Training   Training  Prescription  No      Interval Training   Interval Training  No      NuStep   Level  2    SPM  75    Minutes  30    METs  1.9       Social History   Tobacco Use  Smoking Status Never Smoker  Smokeless Tobacco Never Used    Goals Met:  Exercise tolerated well  Goals Unmet:  Not Applicable  Comments: Pt started cardiac rehab today.  Pt tolerated light exercise without difficulty. VSS, telemetry-SR, asymptomatic.  Medication list reconciled. Pt denies barriers to medicaiton compliance.  PSYCHOSOCIAL ASSESSMENT:  PHQ-0. Pt exhibits positive coping skills, hopeful outlook with supportive family. No psychosocial needs identified at this time, no psychosocial interventions necessary.   Pt oriented to exercise equipment and routine.    Understanding verbalized.    Dr. Fransico Him is Medical Director for Cardiac Rehab at [facility].

## 2019-03-30 ENCOUNTER — Encounter (HOSPITAL_COMMUNITY)
Admission: RE | Admit: 2019-03-30 | Discharge: 2019-03-30 | Disposition: A | Discharge status: Home / Self Care | Payer: Medicare Other | Source: Ambulatory Visit | Attending: Cardiovascular Disease | Admitting: Cardiovascular Disease

## 2019-03-30 DIAGNOSIS — Z9889 Other specified postprocedural states: Secondary | ICD-10-CM | POA: Diagnosis not present

## 2019-03-30 DIAGNOSIS — Z8679 Personal history of other diseases of the circulatory system: Secondary | ICD-10-CM

## 2019-04-02 ENCOUNTER — Encounter (HOSPITAL_COMMUNITY)
Admission: RE | Admit: 2019-04-02 | Discharge: 2019-04-02 | Disposition: A | Discharge status: Home / Self Care | Payer: Medicare Other | Source: Ambulatory Visit | Attending: Cardiovascular Disease | Admitting: Cardiovascular Disease

## 2019-04-02 ENCOUNTER — Other Ambulatory Visit: Payer: Self-pay

## 2019-04-02 DIAGNOSIS — Z8679 Personal history of other diseases of the circulatory system: Secondary | ICD-10-CM

## 2019-04-02 DIAGNOSIS — Z9889 Other specified postprocedural states: Secondary | ICD-10-CM | POA: Diagnosis not present

## 2019-04-04 ENCOUNTER — Telehealth (HOSPITAL_COMMUNITY): Payer: Self-pay | Admitting: Family Medicine

## 2019-04-04 ENCOUNTER — Encounter (HOSPITAL_COMMUNITY): Payer: Medicare Other

## 2019-04-05 NOTE — Progress Notes (Signed)
Cardiac Individual Treatment Plan  Patient Details  Name: Mary Farrell MRN: [id number] Date of Birth: 30-Aug-1945 Referring Provider:     [place] from 03/22/2019 in [place]  Referring Provider  Skeet Latch MD      Initial Encounter Date:    CARDIAC REHAB PHASE II ORIENTATION from 03/22/2019 in [place]  Date  03/22/19      Visit Diagnosis: S/P minimally-invasive maze operation for atrial fibrillation  Patient's Home Medications on Admission:  Current Outpatient Medications:  .  acetaminophen (TYLENOL) 500 MG tablet, Take 1,000 mg by mouth every 6 (six) hours as needed for moderate pain or headache. , Disp: , Rfl:  .  ALPRAZolam (XANAX) 0.25 MG tablet, Take 0.125 mg by mouth 2 (two) times daily as needed for anxiety. , Disp: , Rfl:  .  aspirin EC 81 MG EC tablet, Take 1 tablet (81 mg total) by mouth daily., Disp: , Rfl:  .  carvedilol (COREG) 12.5 MG tablet, Take 1 tablet (12.5 mg total) by mouth 2 (two) times daily with a meal., Disp: 60 tablet, Rfl: 2 .  cetirizine (ZYRTEC) 10 MG tablet, Take 10 mg by mouth at bedtime. , Disp: , Rfl:  .  Cholecalciferol (VITAMIN D) 2000 UNITS CAPS, Take 2,000 Units by mouth daily.  , Disp: , Rfl:  .  conjugated estrogens (PREMARIN) vaginal cream, Place 1 Applicatorful vaginally daily as needed (irritation). , Disp: , Rfl:  .  dofetilide (TIKOSYN) 500 MCG capsule, Take 1 capsule (500 mcg total) by mouth 2 (two) times daily., Disp: 60 capsule, Rfl: 2 .  dorzolamide-timolol (COSOPT) 22.3-6.8 MG/ML ophthalmic solution, Place 1 drop into both eyes daily., Disp: , Rfl:  .  famotidine (PEPCID) 20 MG tablet, Take 20 mg by mouth at bedtime., Disp: , Rfl:  .  furosemide (LASIX) 40 MG tablet, Take 1 tablet (40 mg total) by mouth daily as needed (for worsening swelling or weight gain of 3lbs or more)., Disp: 30 tablet, Rfl: 1 .  gabapentin (NEURONTIN) 300 MG  capsule, Take 300 mg by mouth 3 (three) times daily. , Disp: , Rfl:  .  lactobacillus acidophilus (BACID) TABS tablet, Take 1 tablet by mouth daily. , Disp: , Rfl:  .  latanoprost (XALATAN) 0.005 % ophthalmic solution, Place 1 drop into both eyes at bedtime. , Disp: , Rfl: 6 .  losartan (COZAAR) 25 MG tablet, Take 1 tablet (25 mg total) by mouth daily., Disp: 90 tablet, Rfl: 3 .  Multiple Vitamin (MULTIVITAMIN WITH MINERALS) TABS tablet, Take 1 tablet by mouth daily., Disp: , Rfl:  .  NON FORMULARY, Apply 1 application topically 2 (two) times daily as needed (for eczema). Traimcinolone/[facility] Moist Cream, Disp: , Rfl:  .  omeprazole (PRILOSEC) 40 MG capsule, Take 40 mg by mouth daily. , Disp: , Rfl:  .  oxybutynin (DITROPAN-XL) 10 MG 24 hr tablet, Take 10 mg by mouth daily. , Disp: , Rfl:  .  polyethylene glycol (MIRALAX / GLYCOLAX) 17 g packet, Take 17 g by mouth daily as needed for moderate constipation. , Disp: , Rfl:  .  potassium chloride (KLOR-CON) 10 MEQ tablet, Take 1 tablet (10 mEq total) by mouth daily as needed (if you need to take Lasix)., Disp: 30 tablet, Rfl: 2 .  PRESCRIPTION MEDICATION, Inhale into the lungs at bedtime. CPAP, Disp: , Rfl:  .  warfarin (COUMADIN) 2.5 MG tablet, Take 1 tablet (2.5 mg total) by  mouth daily at 6 PM. Take 1 tablet by mouth daily or as directed by coumadin clinic, Disp: 100 tablet, Rfl: 1 .  warfarin (COUMADIN) 5 MG tablet, TAKE 1 TABLET BY MOUTH DAILY OR AS DIRECTED BY COUMADIN CLINIC, Disp: 90 tablet, Rfl: 0  Past Medical History: Past Medical History:  Diagnosis Date  . Anxiety 06/29/2012  . Aortic regurgitation 08/13/2015  . Arthritis    "hands, wrists, back, feet, toes" (06/24/2017)  . Atrial flutter ([place]) 09/18/2015  . Atypical chest pain 05/13/2016  . Chest pain    remote cath in 1996 with NORMAL coronaries noted  . CHF (congestive heart failure) ([place])   . Dyspnea 10/30/2010  . Essential hypertension 09/05/2013  . Exogenous obesity    history of    . GERD (gastroesophageal reflux disease)   . Glaucoma, both eyes   . Headache, migraine    "stopped in my 50's" (09/18/2015)  . History of cardiovascular stress test 2007   showing no ischemia  . Hypertension   . Mitral stenosis and aortic insufficiency 06/23/2010  . Mitral stenosis with insufficiency   . Mitral stenosis with regurgitation   . Murmur    of mitral stenosis and moderate aortic insufficiency      . Nausea 05/01/2018  . OSA on CPAP   . Palpitations    occasional  . Paroxysmal A-fib ([place]) 06/24/2017  . Pericardial effusion 09/18/2015  . Persistent atrial fibrillation ([place]) 03/10/2017  . Personal history of rheumatic heart disease   . S/P minimally-invasive maze operation for atrial fibrillation 12/13/2018   Complete bilateral atrial lesion set using cryothermy and bipolar radiofrequency ablation with clipping of LA appendage via right mini-thoracotomy approach  . S/P minimally-invasive mitral valve replacement with bioprosthetic valve 12/13/2018   31 mm [facility] Mitral stented bovine pericardial tissue valve  . SBE (subacute bacterial endocarditis)    prophylaxis, patient unaware  . Sliding hiatal hernia   . Vertigo 05/01/2018    Tobacco Use: Social History   Tobacco Use  Smoking Status Never Smoker  Smokeless Tobacco Never Used    Labs: Recent Review Flowsheet Data    Labs for ITP Cardiac and Pulmonary Rehab Latest Ref Rng & Units 12/13/2018 12/13/2018 12/13/2018 12/13/2018 12/14/2018   Hemoglobin A1c 4.8 - 5.6 % - - - - -   PHART 7.350 - 7.450 - 7.495(H) 7.515(H) 7.348(L) 7.372   PCO2ART 32.0 - 48.0 mmHg - 33.9 28.5(L) 42.0 38.1   HCO3 20.0 - 28.0 mmol/L - 26.2 23.4 23.1 22.0   TCO2 22 - 32 mmol/L '26 27 24 24 23   ' ACIDBASEDEF 0.0 - 2.0 mmol/L - - - 2.0 3.0(H)   O2SAT % - 100.0 100.0 99.0 99.0      Capillary Blood Glucose: Lab Results  Component Value Date   GLUCAP 94 01/28/2019   GLUCAP 122 (H) 12/16/2018   GLUCAP 95 12/16/2018   GLUCAP 105 (H)  12/15/2018   GLUCAP 91 12/15/2018     Exercise Target Goals: Exercise Program Goal: Individual exercise prescription set using results from initial 6 min walk test and THRR while considering  patient's activity barriers and safety.   Exercise Prescription Goal: Initial exercise prescription builds to 30-45 minutes a day of aerobic activity, 2-3 days per week.  Home exercise guidelines will be given to patient during program as part of exercise prescription that the participant will acknowledge.  Activity Barriers & Risk Stratification: Activity Barriers & Cardiac Risk Stratification - 03/22/19 1517  Activity Barriers & Cardiac Risk Stratification   Activity Barriers  Arthritis;Joint Problems;Deconditioning;Balance Concerns    Cardiac Risk Stratification  High       6 Minute Walk: 6 Minute Walk    Row Name 03/22/19 1515         6 Minute Walk   Phase  Initial     Distance  970 feet     Walk Time  6 minutes     # of Rest Breaks  0     MPH  1.84     METS  1.79     RPE  12     Perceived Dyspnea   0     VO2 Peak  6.3     Symptoms  No     Resting HR  73 bpm     Resting BP  154/70     Resting Oxygen Saturation   96 %     Exercise Oxygen Saturation  during 6 min walk  96 %     Max Ex. HR  93 bpm     Max Ex. BP  148/72     2 Minute Post BP  128/76        Oxygen Initial Assessment:   Oxygen Re-Evaluation:   Oxygen Discharge (Final Oxygen Re-Evaluation):   Initial Exercise Prescription: Initial Exercise Prescription - 03/22/19 1500      Date of Initial Exercise RX and Referring Provider   Date  03/22/19    Referring Provider  Skeet Latch MD    Expected Discharge Date  05/18/19      NuStep   Level  2    SPM  75    Minutes  30    METs  2      Prescription Details   Frequency (times per week)  3x    Duration  Progress to 10 minutes continuous walking  at current work load and total walking time to 30-45 min      Intensity   THRR 40-80% of Max  Heartrate  58-116    Ratings of Perceived Exertion  11-13    Perceived Dyspnea  0-4      Progression   Progression  Continue progressive overload as per policy without signs/symptoms or physical distress.      Resistance Training   Training Prescription  Yes    Weight  2lbs    Reps  10-15       Perform Capillary Blood Glucose checks as needed.  Exercise Prescription Changes: Exercise Prescription Changes    Row Name 03/28/19 1500 04/02/19 1345           Response to Exercise   Blood Pressure (Admit)  148/86  124/62      Blood Pressure (Exercise)  118/72  112/60      Blood Pressure (Exit)  120/70  122/70      Heart Rate (Admit)  78 bpm  75 bpm      Heart Rate (Exercise)  80 bpm  84 bpm      Heart Rate (Exit)  73 bpm  75 bpm      Rating of Perceived Exertion (Exercise)  12  12      Symptoms  None  None      Comments  Pt first day of exercise.   --      Duration  Continue with 30 min of aerobic exercise without signs/symptoms of physical distress.  Continue with 30 min of aerobic exercise without signs/symptoms of physical  distress.      Intensity  THRR unchanged  THRR unchanged        Progression   Progression  Continue to progress workloads to maintain intensity without signs/symptoms of physical distress.  Continue to progress workloads to maintain intensity without signs/symptoms of physical distress.      Average METs  1.9  2.2        Resistance Training   Training Prescription  No  Yes      Weight  --  2 lbs.       Reps  --  10-15      Time  --  10 Minutes        Interval Training   Interval Training  No  No        NuStep   Level  2  2      SPM  75  75      Minutes  30  30      METs  1.9  2.2         Exercise Comments: Exercise Comments    Row Name 03/28/19 1508 04/05/19 1033         Exercise Comments  Pt first day of exercise. Pt tolerated exercise well.  Pt is progressing well and is doing well so far in the CR program.         Exercise Goals and  Review: Exercise Goals    Row Name 03/22/19 1517             Exercise Goals   Increase Physical Activity  Yes       Intervention  Provide advice, education, support and counseling about physical activity/exercise needs.;Develop an individualized exercise prescription for aerobic and resistive training based on initial evaluation findings, risk stratification, comorbidities and participant's personal goals.       Expected Outcomes  Short Term: Attend rehab on a regular basis to increase amount of physical activity.;Long Term: Add in home exercise to make exercise part of routine and to increase amount of physical activity.;Long Term: Exercising regularly at least 3-5 days a week.       Increase Strength and Stamina  Yes       Intervention  Provide advice, education, support and counseling about physical activity/exercise needs.;Develop an individualized exercise prescription for aerobic and resistive training based on initial evaluation findings, risk stratification, comorbidities and participant's personal goals.       Expected Outcomes  Short Term: Increase workloads from initial exercise prescription for resistance, speed, and METs.;Short Term: Perform resistance training exercises routinely during rehab and add in resistance training at home;Long Term: Improve cardiorespiratory fitness, muscular endurance and strength as measured by increased METs and functional capacity (6MWT)       Able to understand and use rate of perceived exertion (RPE) scale  Yes       Intervention  Provide education and explanation on how to use RPE scale       Expected Outcomes  Short Term: Able to use RPE daily in rehab to express subjective intensity level;Long Term:  Able to use RPE to guide intensity level when exercising independently       Knowledge and understanding of Target Heart Rate Range (THRR)  Yes       Intervention  Provide education and explanation of THRR including how the numbers were predicted and  where they are located for reference       Expected Outcomes  Short Term: Able to state/look up THRR;Long  Term: Able to use THRR to govern intensity when exercising independently;Short Term: Able to use daily as guideline for intensity in rehab       Able to check pulse independently  Yes       Intervention  Provide education and demonstration on how to check pulse in carotid and radial arteries.;Review the importance of being able to check your own pulse for safety during independent exercise       Expected Outcomes  Short Term: Able to explain why pulse checking is important during independent exercise;Long Term: Able to check pulse independently and accurately       Understanding of Exercise Prescription  Yes       Intervention  Provide education, explanation, and written materials on patient's individual exercise prescription       Expected Outcomes  Short Term: Able to explain program exercise prescription;Long Term: Able to explain home exercise prescription to exercise independently          Exercise Goals Re-Evaluation : Exercise Goals Re-Evaluation    Row Name 03/28/19 1506 04/02/19 1345           Exercise Goal Re-Evaluation   Exercise Goals Review  Increase Physical Activity;Understanding of Exercise Prescription;Increase Strength and Stamina;Able to understand and use rate of perceived exertion (RPE) scale  Increase Physical Activity;Increase Strength and Stamina;Able to understand and use rate of perceived exertion (RPE) scale;Knowledge and understanding of Target Heart Rate Range (THRR);Able to check pulse independently;Understanding of Exercise Prescription      Comments  Pt first day of exercise in CR program. Pt tolerated exercise well and understands RPE scale.  Pt is new to the CR program. Pt has been porgressing well and keep her SPM above 75 on the seated stepper. Pt has an an average MET level of 2.0.      Expected Outcomes  Will continue to monitor and progress Pt as  tolerated.  Will continue to monitor and progress Pt as tolerated.         Discharge Exercise Prescription (Final Exercise Prescription Changes): Exercise Prescription Changes - 04/02/19 1345      Response to Exercise   Blood Pressure (Admit)  124/62    Blood Pressure (Exercise)  112/60    Blood Pressure (Exit)  122/70    Heart Rate (Admit)  75 bpm    Heart Rate (Exercise)  84 bpm    Heart Rate (Exit)  75 bpm    Rating of Perceived Exertion (Exercise)  12    Symptoms  None    Duration  Continue with 30 min of aerobic exercise without signs/symptoms of physical distress.    Intensity  THRR unchanged      Progression   Progression  Continue to progress workloads to maintain intensity without signs/symptoms of physical distress.    Average METs  2.2      Resistance Training   Training Prescription  Yes    Weight  2 lbs.     Reps  10-15    Time  10 Minutes      Interval Training   Interval Training  No      NuStep   Level  2    SPM  75    Minutes  30    METs  2.2       Nutrition:  Target Goals: Understanding of nutrition guidelines, daily intake of sodium <1556m, cholesterol <205m calories 30% from fat and 7% or less from saturated fats, daily to have 5 or more  servings of fruits and vegetables.  Biometrics: Pre Biometrics - 03/22/19 1516      Pre Biometrics   Height  4' 11.75" (1.518 m)    Weight  71.4 kg    Waist Circumference  36 inches    Hip Circumference  41 inches    Waist to Hip Ratio  0.88 %    BMI (Calculated)  30.99    Triceps Skinfold  28 mm    % Body Fat  42.4 %    Grip Strength  22 kg    Flexibility  13 in    Single Leg Stand  4.56 seconds        Nutrition Therapy Plan and Nutrition Goals:   Nutrition Assessments:   Nutrition Goals Re-Evaluation:   Nutrition Goals Re-Evaluation:   Nutrition Goals Discharge (Final Nutrition Goals Re-Evaluation):   Psychosocial: Target Goals: Acknowledge presence or absence of significant  depression and/or stress, maximize coping skills, provide positive support system. Participant is able to verbalize types and ability to use techniques and skills needed for reducing stress and depression.  Initial Review & Psychosocial Screening: Initial Psych Review & Screening - 03/22/19 [place]?  Yes    Comments  Ms. Daus has a positive attitude and outlook. She is excited about participating in CR and bettering her health. She is married and has 3 adult children. She admits the stress in her life is more managable since her son has been released from prision. States he is doing very well as a productive member of society. She denies psychosocial barriers to participation in CR or self health management. No interventions needed at this time.      Barriers   Psychosocial barriers to participate in program  There are no identifiable barriers or psychosocial needs.      Screening Interventions   Interventions  Encouraged to exercise       Quality of Life Scores: Quality of Life - 03/28/19 1456      Quality of Life   Select  Quality of Life      Quality of Life Scores   Health/Function Pre  13.47 %    Socioeconomic Pre  20.71 %    Psych/Spiritual Pre  17.79 %    Family Pre  18 %    GLOBAL Pre  16.51 %      Scores of 19 and below usually indicate a poorer quality of life in these areas.  A difference of  2-3 points is a clinically meaningful difference.  A difference of 2-3 points in the total score of the Quality of Life Index has been associated with significant improvement in overall quality of life, self-image, physical symptoms, and general health in studies assessing change in quality of life.  PHQ-9: Recent Review Flowsheet Data    Depression screen [facility] 2/9 03/22/2019 06/07/2016   Decreased Interest 0 0   Down, Depressed, Hopeless 0 0   PHQ - 2 Score 0 0     Interpretation of Total Score  Total Score Depression Severity:  1-4 =  Minimal depression, 5-9 = Mild depression, 10-14 = Moderate depression, 15-19 = Moderately severe depression, 20-27 = Severe depression   Psychosocial Evaluation and Intervention: Psychosocial Evaluation - 03/28/19 1704      Psychosocial Evaluation & Interventions   Interventions  Stress management education;Relaxation education;Encouraged to exercise with the program and follow exercise prescription    Comments  Andrianna reports some stress  in her life but does not endorse any psychosocial interventions at this time.  Makenzi enjoys reading and playing the piano.    Expected Outcomes  Quantisha will maintain a positive outlook with good coping skills and report continued management of her stress.    Continue Psychosocial Services   No Follow up required       Psychosocial Re-Evaluation: Psychosocial Re-Evaluation    [place] Name 04/04/19 1143             Psychosocial Re-Evaluation   Current issues with  Current Stress Concerns       Comments  Malone reports some stress in her life but feels that she is able to manage it.       Expected Outcomes  Karrin will continue to report ability to manage her stress.       Interventions  Encouraged to attend Cardiac Rehabilitation for the exercise       Continue Psychosocial Services   Follow up required by staff          Psychosocial Discharge (Final Psychosocial Re-Evaluation): Psychosocial Re-Evaluation - 04/04/19 1143      Psychosocial Re-Evaluation   Current issues with  Current Stress Concerns    Comments  Dolorez reports some stress in her life but feels that she is able to manage it.    Expected Outcomes  Farran will continue to report ability to manage her stress.    Interventions  Encouraged to attend Cardiac Rehabilitation for the exercise    Continue Psychosocial Services   Follow up required by staff       Vocational Rehabilitation: Provide vocational rehab assistance to qualifying candidates.   Vocational Rehab Evaluation & Intervention: Vocational  Rehab - 03/22/19 1451      Initial Vocational Rehab Evaluation & Intervention   Assessment shows need for Vocational Rehabilitation  No       Education: Education Goals: Education classes will be provided on a weekly basis, covering required topics. Participant will state understanding/return demonstration of topics presented.  Learning Barriers/Preferences: Learning Barriers/Preferences - 03/22/19 1521      Learning Barriers/Preferences   Learning Barriers  Sight    Learning Preferences  Skilled Demonstration;Written Material       Education Topics: Count Your Pulse:  -Group instruction provided by verbal instruction, demonstration, patient participation and written materials to support subject.  Instructors address importance of being able to find your pulse and how to count your pulse when at home without a heart monitor.  Patients get hands on experience counting their pulse with staff help and individually.   Heart Attack, Angina, and Risk Factor Modification:  -Group instruction provided by verbal instruction, video, and written materials to support subject.  Instructors address signs and symptoms of angina and heart attacks.    Also discuss risk factors for heart disease and how to make changes to improve heart health risk factors.   Functional Fitness:  -Group instruction provided by verbal instruction, demonstration, patient participation, and written materials to support subject.  Instructors address safety measures for doing things around the house.  Discuss how to get up and down off the floor, how to pick things up properly, how to safely get out of a chair without assistance, and balance training.   Meditation and Mindfulness:  -Group instruction provided by verbal instruction, patient participation, and written materials to support subject.  Instructor addresses importance of mindfulness and meditation practice to help reduce stress and improve awareness.  Instructor  also leads participants  through a meditation exercise.    Stretching for Flexibility and Mobility:  -Group instruction provided by verbal instruction, patient participation, and written materials to support subject.  Instructors lead participants through series of stretches that are designed to increase flexibility thus improving mobility.  These stretches are additional exercise for major muscle groups that are typically performed during regular warm up and cool down.   Hands Only CPR:  -Group verbal, video, and participation provides a basic overview of AHA guidelines for community CPR. Role-play of emergencies allow participants the opportunity to practice calling for help and chest compression technique with discussion of AED use.   Hypertension: -Group verbal and written instruction that provides a basic overview of hypertension including the most recent diagnostic guidelines, risk factor reduction with self-care instructions and medication management.    Nutrition I class: Heart Healthy Eating:  -Group instruction provided by PowerPoint slides, verbal discussion, and written materials to support subject matter. The instructor gives an explanation and review of the Therapeutic Lifestyle Changes diet recommendations, which includes a discussion on lipid goals, dietary fat, sodium, fiber, plant stanol/sterol esters, sugar, and the components of a well-balanced, healthy diet.   Nutrition II class: Lifestyle Skills:  -Group instruction provided by PowerPoint slides, verbal discussion, and written materials to support subject matter. The instructor gives an explanation and review of label reading, grocery shopping for heart health, heart healthy recipe modifications, and ways to make healthier choices when eating out.   Diabetes Question & Answer:  -Group instruction provided by PowerPoint slides, verbal discussion, and written materials to support subject matter. The instructor gives an  explanation and review of diabetes co-morbidities, pre- and post-prandial blood glucose goals, pre-exercise blood glucose goals, signs, symptoms, and treatment of hypoglycemia and hyperglycemia, and foot care basics.   Diabetes Blitz:  -Group instruction provided by PowerPoint slides, verbal discussion, and written materials to support subject matter. The instructor gives an explanation and review of the physiology behind type 1 and type 2 diabetes, diabetes medications and rational behind using different medications, pre- and post-prandial blood glucose recommendations and Hemoglobin A1c goals, diabetes diet, and exercise including blood glucose guidelines for exercising safely.    Portion Distortion:  -Group instruction provided by PowerPoint slides, verbal discussion, written materials, and food models to support subject matter. The instructor gives an explanation of serving size versus portion size, changes in portions sizes over the last 20 years, and what consists of a serving from each food group.   Stress Management:  -Group instruction provided by verbal instruction, video, and written materials to support subject matter.  Instructors review role of stress in heart disease and how to cope with stress positively.     Exercising on Your Own:  -Group instruction provided by verbal instruction, power point, and written materials to support subject.  Instructors discuss benefits of exercise, components of exercise, frequency and intensity of exercise, and end points for exercise.  Also discuss use of nitroglycerin and activating EMS.  Review options of places to exercise outside of rehab.  Review guidelines for sex with heart disease.   Cardiac Drugs I:  -Group instruction provided by verbal instruction and written materials to support subject.  Instructor reviews cardiac drug classes: antiplatelets, anticoagulants, beta blockers, and statins.  Instructor discusses reasons, side effects, and  lifestyle considerations for each drug class.   Cardiac Drugs II:  -Group instruction provided by verbal instruction and written materials to support subject.  Instructor reviews cardiac drug classes: angiotensin converting enzyme inhibitors (  ACE-I), angiotensin II receptor blockers (ARBs), nitrates, and calcium channel blockers.  Instructor discusses reasons, side effects, and lifestyle considerations for each drug class.   Anatomy and Physiology of the Circulatory System:  Group verbal and written instruction and models provide basic cardiac anatomy and physiology, with the coronary electrical and arterial systems. Review of: AMI, Angina, Valve disease, Heart Failure, Peripheral Artery Disease, Cardiac Arrhythmia, Pacemakers, and the ICD.   Other Education:  -Group or individual verbal, written, or video instructions that support the educational goals of the cardiac rehab program.   Holiday Eating Survival Tips:  -Group instruction provided by PowerPoint slides, verbal discussion, and written materials to support subject matter. The instructor gives patients tips, tricks, and techniques to help them not only survive but enjoy the holidays despite the onslaught of food that accompanies the holidays.   Knowledge Questionnaire Score: Knowledge Questionnaire Score - 03/28/19 1456      Knowledge Questionnaire Score   Pre Score  20/24       Core Components/Risk Factors/Patient Goals at Admission: Personal Goals and Risk Factors at Admission - 03/22/19 1520      Core Components/Risk Factors/Patient Goals on Admission   Hypertension  Yes    Intervention  Provide education on lifestyle modifcations including regular physical activity/exercise, weight management, moderate sodium restriction and increased consumption of fresh fruit, vegetables, and low fat dairy, alcohol moderation, and smoking cessation.;Monitor prescription use compliance.    Expected Outcomes  Short Term: Continued  assessment and intervention until BP is < 140/29m HG in hypertensive participants. < 130/872mHG in hypertensive participants with diabetes, heart failure or chronic kidney disease.;Long Term: Maintenance of blood pressure at goal levels.       Core Components/Risk Factors/Patient Goals Review:  Goals and Risk Factor Review    Row Name 03/28/19 1706             Core Components/Risk Factors/Patient Goals Review   Personal Goals Review  Hypertension       Review  Pt with few CAD RFs willing to participate in CR exercise.  KaAshlyndould like to increase her confidence in her health as well as her strength and stamina.       Expected Outcomes  KaJaquiaill continue to participate in CR exercise, nutrition, and lifestyle modification opportunitites.          Core Components/Risk Factors/Patient Goals at Discharge (Final Review):  Goals and Risk Factor Review - 03/28/19 1706      Core Components/Risk Factors/Patient Goals Review   Personal Goals Review  Hypertension    Review  Pt with few CAD RFs willing to participate in CR exercise.  KaJuriould like to increase her confidence in her health as well as her strength and stamina.    Expected Outcomes  KaFronieill continue to participate in CR exercise, nutrition, and lifestyle modification opportunitites.       ITP Comments: ITP Comments    Row Name 03/22/19 1406 03/28/19 1651         ITP Comments  Dr. TrFransico HimMedical Director [facility]  30 Day ITP Review.  Pt started exercise today and tolerated it well.         Comments: See ITP Comments.

## 2019-04-06 ENCOUNTER — Telehealth (HOSPITAL_COMMUNITY): Payer: Self-pay | Admitting: Family Medicine

## 2019-04-06 ENCOUNTER — Encounter (HOSPITAL_COMMUNITY): Payer: Medicare Other

## 2019-04-09 ENCOUNTER — Other Ambulatory Visit: Payer: Self-pay

## 2019-04-09 ENCOUNTER — Encounter (HOSPITAL_COMMUNITY)
Admission: RE | Admit: 2019-04-09 | Discharge: 2019-04-09 | Disposition: A | Discharge status: Home / Self Care | Payer: Medicare Other | Source: Ambulatory Visit | Attending: Cardiovascular Disease | Admitting: Cardiovascular Disease

## 2019-04-09 DIAGNOSIS — Z9889 Other specified postprocedural states: Secondary | ICD-10-CM | POA: Diagnosis not present

## 2019-04-09 DIAGNOSIS — Z8679 Personal history of other diseases of the circulatory system: Secondary | ICD-10-CM

## 2019-04-11 ENCOUNTER — Ambulatory Visit: Payer: Medicare Other | Admitting: Student

## 2019-04-11 ENCOUNTER — Encounter (HOSPITAL_COMMUNITY)
Admission: RE | Admit: 2019-04-11 | Discharge: 2019-04-11 | Disposition: A | Discharge status: Home / Self Care | Payer: Medicare Other | Source: Ambulatory Visit | Attending: Cardiovascular Disease | Admitting: Cardiovascular Disease

## 2019-04-11 ENCOUNTER — Other Ambulatory Visit: Payer: Self-pay

## 2019-04-11 DIAGNOSIS — Z9889 Other specified postprocedural states: Secondary | ICD-10-CM

## 2019-04-11 DIAGNOSIS — Z8679 Personal history of other diseases of the circulatory system: Secondary | ICD-10-CM

## 2019-04-13 ENCOUNTER — Other Ambulatory Visit: Payer: Self-pay

## 2019-04-13 ENCOUNTER — Encounter (HOSPITAL_COMMUNITY)
Admission: RE | Admit: 2019-04-13 | Discharge: 2019-04-13 | Disposition: A | Discharge status: Home / Self Care | Payer: Medicare Other | Source: Ambulatory Visit | Attending: Cardiovascular Disease | Admitting: Cardiovascular Disease

## 2019-04-13 DIAGNOSIS — Z8679 Personal history of other diseases of the circulatory system: Secondary | ICD-10-CM

## 2019-04-13 DIAGNOSIS — Z9889 Other specified postprocedural states: Secondary | ICD-10-CM | POA: Diagnosis not present

## 2019-04-16 ENCOUNTER — Encounter (HOSPITAL_COMMUNITY)
Admission: RE | Admit: 2019-04-16 | Discharge: 2019-04-16 | Disposition: A | Discharge status: Home / Self Care | Payer: Medicare Other | Source: Ambulatory Visit | Attending: Cardiovascular Disease | Admitting: Cardiovascular Disease

## 2019-04-16 ENCOUNTER — Other Ambulatory Visit: Payer: Self-pay

## 2019-04-16 DIAGNOSIS — Z9889 Other specified postprocedural states: Secondary | ICD-10-CM

## 2019-04-16 DIAGNOSIS — Z8679 Personal history of other diseases of the circulatory system: Secondary | ICD-10-CM

## 2019-04-18 ENCOUNTER — Other Ambulatory Visit: Payer: Self-pay

## 2019-04-18 ENCOUNTER — Encounter (HOSPITAL_COMMUNITY)
Admission: RE | Admit: 2019-04-18 | Discharge: 2019-04-18 | Disposition: A | Discharge status: Home / Self Care | Payer: Medicare Other | Source: Ambulatory Visit | Attending: Cardiovascular Disease | Admitting: Cardiovascular Disease

## 2019-04-18 VITALS — Ht 60.0 in | Wt 154.0 lb

## 2019-04-18 DIAGNOSIS — Z9889 Other specified postprocedural states: Secondary | ICD-10-CM | POA: Diagnosis not present

## 2019-04-18 DIAGNOSIS — Z8679 Personal history of other diseases of the circulatory system: Secondary | ICD-10-CM

## 2019-04-18 NOTE — Progress Notes (Signed)
Mary Farrell 74 y.o. female Nutrition Note  Visit Diagnosis: S/P minimally-invasive maze operation for atrial fibrillation   Past Medical History:  Diagnosis Date  . Anxiety 06/29/2012  . Aortic regurgitation 08/13/2015  . Arthritis    "hands, wrists, back, feet, toes" (06/24/2017)  . Atrial flutter ([place]) 09/18/2015  . Atypical chest pain 05/13/2016  . Chest pain    remote cath in 1996 with NORMAL coronaries noted  . CHF (congestive heart failure) ([place])   . Dyspnea 10/30/2010  . Essential hypertension 09/05/2013  . Exogenous obesity    history of   . GERD (gastroesophageal reflux disease)   . Glaucoma, both eyes   . Headache, migraine    "stopped in my 50's" (09/18/2015)  . History of cardiovascular stress test 2007   showing no ischemia  . Hypertension   . Mitral stenosis and aortic insufficiency 06/23/2010  . Mitral stenosis with insufficiency   . Mitral stenosis with regurgitation   . Murmur    of mitral stenosis and moderate aortic insufficiency      . Nausea 05/01/2018  . OSA on CPAP   . Palpitations    occasional  . Paroxysmal A-fib ([place]) 06/24/2017  . Pericardial effusion 09/18/2015  . Persistent atrial fibrillation ([place]) 03/10/2017  . Personal history of rheumatic heart disease   . S/P minimally-invasive maze operation for atrial fibrillation 12/13/2018   Complete bilateral atrial lesion set using cryothermy and bipolar radiofrequency ablation with clipping of LA appendage via right mini-thoracotomy approach  . S/P minimally-invasive mitral valve replacement with bioprosthetic valve 12/13/2018   31 mm [facility] Mitral stented bovine pericardial tissue valve  . SBE (subacute bacterial endocarditis)    prophylaxis, patient unaware  . Sliding hiatal hernia   . Vertigo 05/01/2018     Medications reviewed.   Current Outpatient Medications:  .  acetaminophen (TYLENOL) 500 MG tablet, Take 1,000 mg by mouth every 6 (six) hours as needed for moderate pain or headache. ,  Disp: , Rfl:  .  ALPRAZolam (XANAX) 0.25 MG tablet, Take 0.125 mg by mouth 2 (two) times daily as needed for anxiety. , Disp: , Rfl:  .  aspirin EC 81 MG EC tablet, Take 1 tablet (81 mg total) by mouth daily., Disp: , Rfl:  .  carvedilol (COREG) 12.5 MG tablet, Take 1 tablet (12.5 mg total) by mouth 2 (two) times daily with a meal., Disp: 60 tablet, Rfl: 2 .  cetirizine (ZYRTEC) 10 MG tablet, Take 10 mg by mouth at bedtime. , Disp: , Rfl:  .  Cholecalciferol (VITAMIN D) 2000 UNITS CAPS, Take 2,000 Units by mouth daily.  , Disp: , Rfl:  .  conjugated estrogens (PREMARIN) vaginal cream, Place 1 Applicatorful vaginally daily as needed (irritation). , Disp: , Rfl:  .  dofetilide (TIKOSYN) 500 MCG capsule, Take 1 capsule (500 mcg total) by mouth 2 (two) times daily., Disp: 60 capsule, Rfl: 2 .  dorzolamide-timolol (COSOPT) 22.3-6.8 MG/ML ophthalmic solution, Place 1 drop into both eyes daily., Disp: , Rfl:  .  famotidine (PEPCID) 20 MG tablet, Take 20 mg by mouth at bedtime., Disp: , Rfl:  .  furosemide (LASIX) 40 MG tablet, Take 1 tablet (40 mg total) by mouth daily as needed (for worsening swelling or weight gain of 3lbs or more)., Disp: 30 tablet, Rfl: 1 .  gabapentin (NEURONTIN) 300 MG capsule, Take 300 mg by mouth 3 (three) times daily. , Disp: , Rfl:  .  lactobacillus acidophilus (BACID) TABS tablet, Take 1  tablet by mouth daily. , Disp: , Rfl:  .  latanoprost (XALATAN) 0.005 % ophthalmic solution, Place 1 drop into both eyes at bedtime. , Disp: , Rfl: 6 .  losartan (COZAAR) 25 MG tablet, Take 1 tablet (25 mg total) by mouth daily., Disp: 90 tablet, Rfl: 3 .  Multiple Vitamin (MULTIVITAMIN WITH MINERALS) TABS tablet, Take 1 tablet by mouth daily., Disp: , Rfl:  .  NON FORMULARY, Apply 1 application topically 2 (two) times daily as needed (for eczema). Traimcinolone/[facility] Moist Cream, Disp: , Rfl:  .  omeprazole (PRILOSEC) 40 MG capsule, Take 40 mg by mouth daily. , Disp: , Rfl:  .  oxybutynin  (DITROPAN-XL) 10 MG 24 hr tablet, Take 10 mg by mouth daily. , Disp: , Rfl:  .  polyethylene glycol (MIRALAX / GLYCOLAX) 17 g packet, Take 17 g by mouth daily as needed for moderate constipation. , Disp: , Rfl:  .  potassium chloride (KLOR-CON) 10 MEQ tablet, Take 1 tablet (10 mEq total) by mouth daily as needed (if you need to take Lasix)., Disp: 30 tablet, Rfl: 2 .  PRESCRIPTION MEDICATION, Inhale into the lungs at bedtime. CPAP, Disp: , Rfl:  .  warfarin (COUMADIN) 2.5 MG tablet, Take 1 tablet (2.5 mg total) by mouth daily at 6 PM. Take 1 tablet by mouth daily or as directed by coumadin clinic, Disp: 100 tablet, Rfl: 1 .  warfarin (COUMADIN) 5 MG tablet, TAKE 1 TABLET BY MOUTH DAILY OR AS DIRECTED BY COUMADIN CLINIC, Disp: 90 tablet, Rfl: 0   Ht Readings from Last 1 Encounters:  03/26/19 5' (1.524 m)     Wt Readings from Last 3 Encounters:  03/26/19 154 lb (69.9 kg)  03/22/19 157 lb 6.5 oz (71.4 kg)  03/06/19 153 lb 3.2 oz (69.5 kg)     There is no height or weight on file to calculate BMI.   Social History   Tobacco Use  Smoking Status Never Smoker  Smokeless Tobacco Never Used     No results found for: CHOL No results found for: HDL No results found for: LDLCALC No results found for: TRIG   Lab Results  Component Value Date   HGBA1C 5.3 12/11/2018     CBG (last 3)  No results for input(s): GLUCAP in the last 72 hours.   Nutrition Note  Spoke with pt. Nutrition Plan and Nutrition Survey goals reviewed with pt.   Pt reports less cooking and higher intake of convenience foods. She typically eats a frozen meal for dinner.She is mindful of labels but thinks she probably eats too much sodium. Reviewed low sodium diet and label reading. She reports A1C being in prediabetes range years ago. We discussed her current A1C and eating a balanced diet.  She does not drink sugary beverages or eat large amounts of added sugars.   Pt expressed understanding of the information  reviewed.   Nutrition Diagnosis ? Inadequate fiber intake related to high intake of frozen meals and convenience foods as evidenced by diet recall and report of constipation  Nutrition Intervention ? Pt's individual nutrition plan reviewed with pt. ? Benefits of adopting Heart Healthy diet discussed when Medficts reviewed.   ? Continue client-centered nutrition education by RD, as part of interdisciplinary care.  Goal(s) ? Pt to build a healthy plate including vegetables, fruits, whole grains, and low-fat dairy products in a heart healthy meal plan. ? Pt to incorporate 1/4 plate of fiber rich starches at meals  Plan:   Will provide  client-centered nutrition education as part of interdisciplinary care  Monitor and evaluate progress toward nutrition goal with team.   Michaele Offer, MS, RDN, LDN

## 2019-04-19 ENCOUNTER — Ambulatory Visit: Payer: Medicare Other | Admitting: Podiatry

## 2019-04-19 ENCOUNTER — Ambulatory Visit (INDEPENDENT_AMBULATORY_CARE_PROVIDER_SITE_OTHER): Payer: Medicare Other | Admitting: Pharmacist Clinician (PhC)/ Clinical Pharmacy Specialist

## 2019-04-19 ENCOUNTER — Ambulatory Visit (INDEPENDENT_AMBULATORY_CARE_PROVIDER_SITE_OTHER): Payer: Medicare Other | Admitting: Cardiovascular Disease

## 2019-04-19 VITALS — BP 136/82 | HR 63 | Ht 60.0 in | Wt 155.0 lb

## 2019-04-19 DIAGNOSIS — I10 Essential (primary) hypertension: Secondary | ICD-10-CM | POA: Diagnosis not present

## 2019-04-19 DIAGNOSIS — Z9889 Other specified postprocedural states: Secondary | ICD-10-CM | POA: Diagnosis not present

## 2019-04-19 DIAGNOSIS — Z7901 Long term (current) use of anticoagulants: Secondary | ICD-10-CM

## 2019-04-19 DIAGNOSIS — I351 Nonrheumatic aortic (valve) insufficiency: Secondary | ICD-10-CM | POA: Diagnosis not present

## 2019-04-19 DIAGNOSIS — I471 Supraventricular tachycardia: Secondary | ICD-10-CM | POA: Diagnosis not present

## 2019-04-19 DIAGNOSIS — I480 Paroxysmal atrial fibrillation: Secondary | ICD-10-CM

## 2019-04-19 DIAGNOSIS — Z8679 Personal history of other diseases of the circulatory system: Secondary | ICD-10-CM

## 2019-04-19 MED ORDER — APIXABAN 5 MG PO TABS: 5.0000 mg | ORAL_TABLET | Freq: Two times a day (BID) | ORAL | 5 refills | Status: DC

## 2019-04-19 NOTE — Progress Notes (Signed)
Cardiology Office Note  Date:  04/21/2019   ID:  KIESA SAKAL, DOB [birth year], MRN [id number]  PCP:  Lawerance Cruel, MD  Cardiologist:   Skeet Latch, MD  Electrophysiologist: Dr. Curt Bears  No chief complaint on file.   History of Present Illness: Mary Farrell is a 74 y.o. female with moderate aortic regurgitation, Rheumatic mitral stenosis s/p MVR, chronic pericardial effusion, paroxysmal atrial flutter s/p ablation 05/2017, MAZE 11/2018 and hypertension who presents for follow up.  Ms. Ellingboe was previously a patient of Dr. Mare Ferrari.  She followed up with Truitt Merle on 04/2015 due to shortness of breath that she felt was getting worse.  She was referred for an echo 04/2015 that revealed LVEF 55-60% with moderate AR and mild-moderate MS.  There was also a moderate pericardial effusion but no evidence of tamponade.  She had a heart cath 12/1994 that revealed normal coronaries with mild MS and mild pulmonary hypertension.  She also had a Cardiolite in 2007 that was negative for ischemia.  She was seen in clinic 07/2015 and reported persistent shortness of breath with minimal exertion and chest discomfort.  She was referred for exercise Myoview 08/26/15 that revealed LVEF 60% and no perfusion defects.    Ms. Offord was admitted 08/2015 with atrial flutter.  She had an echo that showed a persistent moderate-severe pericardial effusion but no tamponade.  She was started on amiodarone, which she did not tolerate due to nausea.  She underwent DCCV on 09/22/15 and then was started on flecainide.  She continued to report shortness of breath and was again referred for an echo that revealed LVEF 60-65% with grade 2 diastolic dysfunction, mild aortic regurgitation, and moderate mitral stenosis. She had a moderate pericardial effusion localized to the inferior and inferolateral walls. There was no evidence of tamponade.  Ms. Dunstan had an outpatient DCCV on 10/1 and converted with one shock at [place].  She  developed recurrent atrial fibrillation and was referred to EP.  She underwent afib/flutter ablation with Dr. Curt Bears on 06/24/17. She was started on flecainide but had recurent disease and was started on Tikosyn.  Her mitral valve was replaced 11/2018 (31 mm [facility] bioprosthetic).  She had a MAZE at that time.  She followed up with Laroy Apple 1/13 and was doing well.   Since her last appointment Ms. Birchard has been doing well.  She started participating in cardiac rehab and is noticing some improvement already.  1 day she worked out so hard that she was sore for the next couple sessions and unable to attend.  Her blood pressure at home has been in the 0000000 to 0000000 systolic.  She has noted improvement in her breathing.  She is most frustrated that she sometimes can hear her heart beating in her left ear.  She wonders if this is attributable to anxiety.  She also has difficulty falling asleep.  She has lorazepam but does not want to keep taking that.  She was previously on another medication that had to be discontinued when she started Tikosyn.  He does not think she is having recurrent episodes of atrial fibrillation.  She denies lower extremity edema, orthopnea, or PND.   Past Medical History:  Diagnosis Date  . Anxiety 06/29/2012  . Aortic regurgitation 08/13/2015  . Arthritis    "hands, wrists, back, feet, toes" (06/24/2017)  . Atrial flutter ([place]) 09/18/2015  . Atypical chest pain 05/13/2016  . Chest pain    remote cath in  1996 with NORMAL coronaries noted  . CHF (congestive heart failure) ([place])   . Dyspnea 10/30/2010  . Essential hypertension 09/05/2013  . Exogenous obesity    history of   . GERD (gastroesophageal reflux disease)   . Glaucoma, both eyes   . Headache, migraine    "stopped in my 50's" (09/18/2015)  . History of cardiovascular stress test 2007   showing no ischemia  . Hypertension   . Mitral stenosis and aortic insufficiency 06/23/2010  . Mitral stenosis with insufficiency    . Mitral stenosis with regurgitation   . Murmur    of mitral stenosis and moderate aortic insufficiency      . Nausea 05/01/2018  . OSA on CPAP   . Palpitations    occasional  . Paroxysmal A-fib ([place]) 06/24/2017  . Pericardial effusion 09/18/2015  . Persistent atrial fibrillation ([place]) 03/10/2017  . Personal history of rheumatic heart disease   . S/P minimally-invasive maze operation for atrial fibrillation 12/13/2018   Complete bilateral atrial lesion set using cryothermy and bipolar radiofrequency ablation with clipping of LA appendage via right mini-thoracotomy approach  . S/P minimally-invasive mitral valve replacement with bioprosthetic valve 12/13/2018   31 mm [facility] Mitral stented bovine pericardial tissue valve  . SBE (subacute bacterial endocarditis)    prophylaxis, patient unaware  . Sliding hiatal hernia   . Vertigo 05/01/2018    Past Surgical History:  Procedure Laterality Date  . ABDOMINAL HYSTERECTOMY  1975  . APPENDECTOMY  1977  . ATRIAL FIBRILLATION ABLATION N/A 03/10/2017   Procedure: ATRIAL FIBRILLATION ABLATION;  Surgeon: [name], MD;  Location: [place] [facility];  Service: Cardiovascular;  Laterality: N/A;  . ATRIAL FIBRILLATION ABLATION  06/24/2017  . ATRIAL FIBRILLATION ABLATION N/A 06/24/2017   Procedure: ATRIAL FIBRILLATION ABLATION;  Surgeon: [name], MD;  Location: [place] [facility];  Service: Cardiovascular;  Laterality: N/A;  . BUNIONECTOMY Right 2000s  . CARDIAC CATHETERIZATION  01/13/1995   normal coronary anatomy and mild mitral stenosis and mild pulmonary hypertension  . CARDIOVERSION N/A 09/22/2015   Procedure: CARDIOVERSION;  Surgeon: Jerline Pain, MD;  Location: [place];  Service: Cardiovascular;  Laterality: N/A;  . CARDIOVERSION N/A 10/25/2016   Procedure: CARDIOVERSION;  Surgeon: Fay Records, MD;  Location: [place];  Service: Cardiovascular;  Laterality: N/A;  . CARDIOVERSION N/A 04/15/2017   Procedure:  CARDIOVERSION;  Surgeon: Josue Hector, MD;  Location: [facility] ENDOSCOPY;  Service: Cardiovascular;  Laterality: N/A;  . CARDIOVERSION N/A 05/16/2017   Procedure: CARDIOVERSION;  Surgeon: Pixie Casino, MD;  Location: [facility] ENDOSCOPY;  Service: Cardiovascular;  Laterality: N/A;  . CARDIOVERSION N/A 01/24/2019   Procedure: CARDIOVERSION;  Surgeon: Pixie Casino, MD;  Location: [facility] ENDOSCOPY;  Service: Cardiovascular;  Laterality: N/A;  . CATARACT EXTRACTION W/ INTRAOCULAR LENS  IMPLANT, BILATERAL Bilateral 2013  . COLONOSCOPY  2013  . EYE SURGERY Bilateral    cataracts  . LAPAROSCOPIC CHOLECYSTECTOMY  1997  . MINIMALLY INVASIVE MAZE PROCEDURE N/A 12/13/2018   Procedure: MINIMALLY INVASIVE MAZE PROCEDURE using a 45 MM AtriClip.;  Surgeon: Rexene Alberts, MD;  Location: [place];  Service: Open Heart Surgery;  Laterality: N/A;  . MITRAL VALVE REPLACEMENT Right 12/13/2018   Procedure: MINIMALLY INVASIVE MITRAL VALVE (MV) REPLACEMENT using a Magna Mitral Ease 31 MM Valve.;  Surgeon: Rexene Alberts, MD;  Location: [place];  Service: Open Heart Surgery;  Laterality: Right;  . RIGHT/LEFT HEART CATH AND CORONARY ANGIOGRAPHY N/A 10/18/2018   Procedure:  RIGHT/LEFT HEART CATH AND CORONARY ANGIOGRAPHY;  Surgeon: Leonie Man, MD;  Location: [place] [facility];  Service: Cardiovascular;  Laterality: N/A;  . TEE WITHOUT CARDIOVERSION N/A 06/23/2017   Procedure: TRANSESOPHAGEAL ECHOCARDIOGRAM (TEE);  Surgeon: Fay Records, MD;  Location: [place];  Service: Cardiovascular;  Laterality: N/A;  . TEE WITHOUT CARDIOVERSION N/A 10/17/2018   Procedure: TRANSESOPHAGEAL ECHOCARDIOGRAM (TEE);  Surgeon: Jerline Pain, MD;  Location: [facility] ENDOSCOPY;  Service: Cardiovascular;  Laterality: N/A;  . TEE WITHOUT CARDIOVERSION N/A 12/13/2018   Procedure: TRANSESOPHAGEAL ECHOCARDIOGRAM (TEE);  Surgeon: Rexene Alberts, MD;  Location: [place];  Service: Open Heart Surgery;  Laterality: N/A;  . TONSILLECTOMY AND ADENOIDECTOMY   1990s  . UVULOPALATOPHARYNGOPLASTY, TONSILLECTOMY AND SEPTOPLASTY  1990s     Current Outpatient Medications  Medication Sig Dispense Refill  . acetaminophen (TYLENOL) 500 MG tablet Take 1,000 mg by mouth every 6 (six) hours as needed for moderate pain or headache.     . ALPRAZolam (XANAX) 0.25 MG tablet Take 0.125 mg by mouth 2 (two) times daily as needed for anxiety.     . carvedilol (COREG) 12.5 MG tablet Take 1 tablet (12.5 mg total) by mouth 2 (two) times daily with a meal. 60 tablet 2  . cetirizine (ZYRTEC) 10 MG tablet Take 10 mg by mouth at bedtime.     . Cholecalciferol (VITAMIN D) 2000 UNITS CAPS Take 2,000 Units by mouth daily.      Marland Kitchen conjugated estrogens (PREMARIN) vaginal cream Place 1 Applicatorful vaginally daily as needed (irritation).     . dorzolamide-timolol (COSOPT) 22.3-6.8 MG/ML ophthalmic solution Place 1 drop into both eyes daily.    . famotidine (PEPCID) 20 MG tablet Take 20 mg by mouth at bedtime.    . furosemide (LASIX) 40 MG tablet Take 1 tablet (40 mg total) by mouth daily as needed (for worsening swelling or weight gain of 3lbs or more). 30 tablet 1  . gabapentin (NEURONTIN) 300 MG capsule Take 300 mg by mouth 3 (three) times daily.     Marland Kitchen lactobacillus acidophilus (BACID) TABS tablet Take 1 tablet by mouth daily.     Marland Kitchen latanoprost (XALATAN) 0.005 % ophthalmic solution Place 1 drop into both eyes at bedtime.   6  . losartan (COZAAR) 50 MG tablet Take 50 mg by mouth daily.    . Multiple Vitamin (MULTIVITAMIN WITH MINERALS) TABS tablet Take 1 tablet by mouth daily.    . NON FORMULARY Apply 1 application topically 2 (two) times daily as needed (for eczema). Traimcinolone/[employer] Moist Cream    . omeprazole (PRILOSEC) 40 MG capsule Take 40 mg by mouth daily.     Marland Kitchen oxybutynin (DITROPAN-XL) 10 MG 24 hr tablet Take 10 mg by mouth daily.     . polyethylene glycol (MIRALAX / GLYCOLAX) 17 g packet Take 17 g by mouth daily as needed for moderate constipation.     . potassium  chloride (KLOR-CON) 10 MEQ tablet Take 1 tablet (10 mEq total) by mouth daily as needed (if you need to take Lasix). 30 tablet 2  . PRESCRIPTION MEDICATION Inhale into the lungs at bedtime. CPAP    . apixaban (ELIQUIS) 5 MG TABS tablet Take 1 tablet (5 mg total) by mouth 2 (two) times daily. 60 tablet 5  . dofetilide (TIKOSYN) 500 MCG capsule TAKE 1 CAPSULE (500 MCG TOTAL) BY MOUTH 2 (TWO) TIMES DAILY. 180 capsule 3   No current facility-administered medications for this visit.    Allergies:   Amoxicillin,  Metoprolol tartrate, and Oxaprozin    Social History:  The patient  reports that she has never smoked. She has never used smokeless tobacco. She reports current alcohol use. She reports that she does not use drugs.   Family History:  The patient's  family history includes Diabetes in her brother; Heart attack in her mother; Heart failure in her maternal grandfather; Hypertension in her brother; Kidney disease in her brother.    ROS:  Please see the history of present illness.   Otherwise, review of systems are positive for none.   All other systems are reviewed and negative.    PHYSICAL EXAM: VS:  BP 136/82   Pulse 63   Ht 5' (1.524 m)   Wt 155 lb (70.3 kg)   SpO2 96%   BMI 30.27 kg/m  , BMI Body mass index is 30.27 kg/m. GENERAL:  Well appearing HEENT: Pupils equal round and reactive, fundi not visualized, oral mucosa unremarkable NECK:  No jugular venous distention, waveform within normal limits, carotid upstroke brisk and symmetric, no bruits LUNGS:  Clear to auscultation bilaterally HEART:  RRR.  PMI not displaced or sustained,S1 and S2 within normal limits, + S3, no S4, no clicks, no rubs, III/VI diastolic murmur at the LUSB.  III/VI holosystolic murmur at the apex.   ABD:  Flat, positive bowel sounds normal in frequency in pitch, no bruits, no rebound, no guarding, no midline pulsatile mass, no hepatomegaly, no splenomegaly EXT:  2 plus pulses throughout, no edema, no  cyanosis no clubbing SKIN:  No rashes no nodules NEURO:  Cranial nerves II through XII grossly intact, motor grossly intact throughout PSYCH:  Cognitively intact, oriented to person place and time.Ansious   EKG:  EKG is not ordered today. The ekg ordered 05/07/15 demonstraSinus bradycardia rate 57 bpm. 05/13/16: Sinus rhythm. Rate 66 bpm. 09/09/16: Sinus rhythm. Rate 64 bpm.   10/26/16: Sinus rhythm.  Rate 74 bpm.  Exercise Myoveiw 08/26/15:  The left ventricular ejection fraction is normal (55-65%).  Nuclear stress EF: 60%.  Blood pressure demonstrated a hypertensive response to exercise.  Upsloping ST segment depression ST segment depression of 1 mm was noted during stress in the II, III, V6, V5 and aVF leads.  This is a low risk study.   No reversible ischemia. LVEF 60% with normal wall motion. Fair exercise tolerance. No chest pain. This is a low risk study.  TTE 01/23/19: 1. Left ventricular ejection fraction, by visual estimation, is 55 to  60%. The left ventricle has normal function. Left ventricular septal wall  thickness was mildly increased. Moderately increased left ventricular  posterior wall thickness. There is  mildly increased left ventricular hypertrophy.  2. Left ventricular diastolic parameters are indeterminate.  3. The left ventricle demonstrates regional wall motion abnormalities.  4. Elevated LVEDP. Hypokinesis of the basal septum consistent with  post-operative state.  5. Global right ventricle has normal systolic function.The right  ventricular size is normal. No increase in right ventricular wall  thickness.  6. Left atrial size was severely dilated.  7. Right atrial size was normal.  8. The mitral valve has been repaired/replaced. No evidence of mitral  valve regurgitation. No evidence of mitral stenosis.  9. The tricuspid valve is normal in structure.  10. The aortic valve is tricuspid. Aortic valve regurgitation is mild. No  evidence of  aortic valve sclerosis or stenosis.  11. The pulmonic valve was normal in structure. Pulmonic valve  regurgitation is not visualized.  12. Normal pulmonary artery  systolic pressure.  13. The inferior vena cava is normal in size with <50% respiratory  variability, suggesting right atrial pressure of 8 mmHg.   Recent Labs: 01/22/2019: Hemoglobin 12.0; Platelets 355 01/28/2019: ALT 13 02/05/2019: Magnesium 2.1 02/15/2019: BUN 10; Creatinine, Ser 0.79; Potassium 4.3; Sodium 141   07/05/16: Total cholesterol 167, triglycerides 159, HDL 47, LDL 88 TSH 2.2 Sodium 141, potassium 3.8, BUN 14, creatinine 0.77 AST 13, ALT 14  08/03/2017: Total cholesterol 215, triglycerides 121, HDL 67, LDL 124  Lipid Panel No results found for: CHOL, TRIG, HDL, CHOLHDL, VLDL, LDLCALC, LDLDIRECT    Wt Readings from Last 3 Encounters:  04/19/19 155 lb (70.3 kg)  04/18/19 154 lb (69.9 kg)  03/26/19 154 lb (69.9 kg)     ASSESSMENT AND PLAN:  # Persitent atrial flutter/fibrillation:  Now maintaining sinus rhythm on Tikosyn.  Continue carvedilol and diltiazem.  We will switch from warfarin to Eliquis due to patient request.   # Mitral stenosis s/p MVR: # Rheumatic heart disease: TEE showed severe MS.  She underwent successful minimally invasive bioprosthetic MVR with Dr. Roxy Manns 11/2018.  Mean gradient 5 mmHg 12/2018.  We will switch from warfarin to Eliquis as above.    # Hypertension: BP is above goal.  Increase losartan to 50mg .  Check BMP in one week.  Continue carvedilol, diltiazem, and HCTZ.    # Hyperlipidemia: ASCVD 10 year risk 15%.  She wants to really focus on diet and exercise before starting a statin.  We will recheck in 4 months and reevaluate.     Current medicines are reviewed at length with the patient today.  The patient does not have concerns regarding medicines.  The following changes have been made:  Restart losartan 25mg    Labs/ tests ordered today include:   No orders of the  defined types were placed in this encounter.    Disposition:   FU with Arda Keadle C. Oval Linsey, MD, [facility] in 2 months virtual or in person    Signed, [place]. Oval Linsey, MD, [facility]  04/21/2019 2:11 PM    [facility]

## 2019-04-19 NOTE — Patient Instructions (Addendum)
Medication Instructions:  STOP WARFARIN   START ELIQUIS 5 MG TWICE A DAY STARTING TOMORROW   INCREASE LOSARTAN 50 MG DAILY   *If you need a refill on your cardiac medications before your next appointment, please call your pharmacy*  Lab Work: NONE   Testing/Procedures: NONE   Follow-Up: At [facility], you and your health needs are our priority.  As part of our continuing mission to provide you with exceptional heart care, we have created designated Provider Care Teams.  These Care Teams include your primary Cardiologist (physician) and Advanced Practice Providers (APPs -  Physician Assistants and Nurse Practitioners) who all work together to provide you with the care you need, when you need it.  We recommend signing up for the patient portal called "MyChart".  Sign up information is provided on this After Visit Summary.  MyChart is used to connect with patients for Virtual Visits (Telemedicine).  Patients are able to view lab/test results, encounter notes, upcoming appointments, etc.  Non-urgent messages can be sent to your provider as well.   To learn more about what you can do with MyChart, go to [email or web address].    Your next appointment:   2 month(s)  The format for your next appointment:   In Person  Provider:   You may see Skeet Latch, MD or one of the following Advanced Practice Providers on your designated Care Team:    Kerin Ransom, PA-C  [place], [place]  Coletta Memos, [place]    Other Instructions MONITOR YOUR BLOOD PRESSURE AT HOME IF YOUR BLOOD PRESSURE REMAINS ABOVE 130/80 CALL THE OFFICE TO HAVE MEDICATION CHANGED TO SOMETHING ELSE

## 2019-04-20 ENCOUNTER — Other Ambulatory Visit: Payer: Self-pay

## 2019-04-20 ENCOUNTER — Encounter (HOSPITAL_COMMUNITY)
Admission: RE | Admit: 2019-04-20 | Discharge: 2019-04-20 | Disposition: A | Discharge status: Home / Self Care | Payer: Medicare Other | Source: Ambulatory Visit | Attending: Cardiovascular Disease | Admitting: Cardiovascular Disease

## 2019-04-20 ENCOUNTER — Other Ambulatory Visit: Payer: Self-pay | Admitting: Student

## 2019-04-20 DIAGNOSIS — Z8679 Personal history of other diseases of the circulatory system: Secondary | ICD-10-CM

## 2019-04-20 DIAGNOSIS — Z9889 Other specified postprocedural states: Secondary | ICD-10-CM | POA: Diagnosis not present

## 2019-04-21 ENCOUNTER — Encounter: Payer: Self-pay | Admitting: Cardiovascular Disease

## 2019-04-23 ENCOUNTER — Encounter (HOSPITAL_COMMUNITY)
Admission: RE | Admit: 2019-04-23 | Discharge: 2019-04-23 | Disposition: A | Discharge status: Home / Self Care | Payer: Medicare Other | Source: Ambulatory Visit | Attending: Cardiovascular Disease | Admitting: Cardiovascular Disease

## 2019-04-23 ENCOUNTER — Other Ambulatory Visit: Payer: Self-pay

## 2019-04-23 DIAGNOSIS — Z9889 Other specified postprocedural states: Secondary | ICD-10-CM

## 2019-04-23 DIAGNOSIS — Z8679 Personal history of other diseases of the circulatory system: Secondary | ICD-10-CM

## 2019-04-25 ENCOUNTER — Other Ambulatory Visit: Payer: Self-pay

## 2019-04-25 ENCOUNTER — Encounter (HOSPITAL_COMMUNITY)
Admission: RE | Admit: 2019-04-25 | Discharge: 2019-04-25 | Disposition: A | Discharge status: Home / Self Care | Payer: Medicare Other | Source: Ambulatory Visit | Attending: Cardiovascular Disease | Admitting: Cardiovascular Disease

## 2019-04-25 DIAGNOSIS — Z9889 Other specified postprocedural states: Secondary | ICD-10-CM | POA: Diagnosis not present

## 2019-04-25 DIAGNOSIS — Z8679 Personal history of other diseases of the circulatory system: Secondary | ICD-10-CM

## 2019-04-27 ENCOUNTER — Other Ambulatory Visit: Payer: Self-pay

## 2019-04-27 ENCOUNTER — Encounter (HOSPITAL_COMMUNITY)
Admission: RE | Admit: 2019-04-27 | Discharge: 2019-04-27 | Disposition: A | Discharge status: Home / Self Care | Payer: Medicare Other | Source: Ambulatory Visit | Attending: Cardiovascular Disease | Admitting: Cardiovascular Disease

## 2019-04-27 DIAGNOSIS — Z8679 Personal history of other diseases of the circulatory system: Secondary | ICD-10-CM | POA: Insufficient documentation

## 2019-04-27 DIAGNOSIS — Z9889 Other specified postprocedural states: Secondary | ICD-10-CM | POA: Diagnosis not present

## 2019-04-30 ENCOUNTER — Encounter (HOSPITAL_COMMUNITY)
Admission: RE | Admit: 2019-04-30 | Discharge: 2019-04-30 | Disposition: A | Discharge status: Home / Self Care | Payer: Medicare Other | Source: Ambulatory Visit | Attending: Cardiovascular Disease | Admitting: Cardiovascular Disease

## 2019-04-30 ENCOUNTER — Other Ambulatory Visit: Payer: Self-pay

## 2019-04-30 DIAGNOSIS — Z8679 Personal history of other diseases of the circulatory system: Secondary | ICD-10-CM

## 2019-04-30 DIAGNOSIS — Z9889 Other specified postprocedural states: Secondary | ICD-10-CM | POA: Diagnosis not present

## 2019-04-30 DIAGNOSIS — Z953 Presence of xenogenic heart valve: Secondary | ICD-10-CM

## 2019-05-01 ENCOUNTER — Telehealth: Payer: Self-pay | Admitting: Cardiovascular Disease

## 2019-05-01 NOTE — Progress Notes (Signed)
Reviewed home exercise guidelines with patient including endpoints, temperature precautions, target heart rate and rate of perceived exertion. Pt plans to walk as her mode of home exercise. Patient will walk 10 minutes, 3 times/day, 1-2 days at home in addition to exercise at cardiac rehab. Walking is limited by ongoing left knee/leg pain. Reviewed pulse counting with patient. Pt voices understanding of instructions given.  Mary Farrell

## 2019-05-01 NOTE — Telephone Encounter (Signed)
New Message  Patient is calling in to speak with Dr. Blenda Mounts nurse [place]. Patient states that she is having some discrepancies with her blood pressure and states that she can hear her heart beat in her ear. Please give patient call back to discuss.

## 2019-05-01 NOTE — Telephone Encounter (Signed)
Spoke with patient. Patient was told to call in by [facility] but she wasn't sure what to report when she called in. Patient having discrepancies in her BP. Cardiac rehab is reading low 100s/60s and her wrist cuff is picking up 140s. Patient advised to purchase an upper arm blood pressure cuff and call back in a few days with new readings for review.   Patient reports hearing her heart beat in her ears. Patient asymptomatic otherwise.   Patient advised to call back if she develops dizziness, CP, confusion or weakness.   Will route to [place] so that she is aware.

## 2019-05-02 ENCOUNTER — Other Ambulatory Visit: Payer: Self-pay

## 2019-05-02 ENCOUNTER — Encounter (HOSPITAL_COMMUNITY)
Admission: RE | Admit: 2019-05-02 | Discharge: 2019-05-02 | Disposition: A | Discharge status: Home / Self Care | Payer: Medicare Other | Source: Ambulatory Visit | Attending: Cardiovascular Disease | Admitting: Cardiovascular Disease

## 2019-05-02 DIAGNOSIS — Z9889 Other specified postprocedural states: Secondary | ICD-10-CM | POA: Diagnosis not present

## 2019-05-02 DIAGNOSIS — Z8679 Personal history of other diseases of the circulatory system: Secondary | ICD-10-CM

## 2019-05-02 DIAGNOSIS — Z953 Presence of xenogenic heart valve: Secondary | ICD-10-CM

## 2019-05-02 MED ORDER — APIXABAN 5 MG PO TABS: 5.0000 mg | ORAL_TABLET | Freq: Two times a day (BID) | ORAL | 1 refills | Status: DC

## 2019-05-02 MED ORDER — LOSARTAN POTASSIUM 50 MG PO TABS: 50.0000 mg | ORAL_TABLET | Freq: Every day | ORAL | 1 refills | Status: DC

## 2019-05-03 NOTE — Progress Notes (Signed)
Cardiac Individual Treatment Plan  Patient Details  Name: RONIQUE SIMERLY MRN: [id number] Date of Birth: 12-19-1945 Referring Provider:     [place] from 03/22/2019 in [place]  Referring Provider  Skeet Latch MD      Initial Encounter Date:    CARDIAC REHAB [place] from 03/22/2019 in [place]  Date  03/22/19      Visit Diagnosis: S/P minimally-invasive maze operation for atrial fibrillation  S/P minimally-invasive mitral valve replacement with bioprosthetic valve + maze procedure  Patient's Home Medications on Admission:  Current Outpatient Medications:  .  acetaminophen (TYLENOL) 500 MG tablet, Take 1,000 mg by mouth every 6 (six) hours as needed for moderate pain or headache. , Disp: , Rfl:  .  ALPRAZolam (XANAX) 0.25 MG tablet, Take 0.125 mg by mouth 2 (two) times daily as needed for anxiety. , Disp: , Rfl:  .  apixaban (ELIQUIS) 5 MG TABS tablet, Take 1 tablet (5 mg total) by mouth 2 (two) times daily., Disp: 180 tablet, Rfl: 1 .  carvedilol (COREG) 12.5 MG tablet, Take 1 tablet (12.5 mg total) by mouth 2 (two) times daily with a meal., Disp: 60 tablet, Rfl: 2 .  cetirizine (ZYRTEC) 10 MG tablet, Take 10 mg by mouth at bedtime. , Disp: , Rfl:  .  Cholecalciferol (VITAMIN D) 2000 UNITS CAPS, Take 2,000 Units by mouth daily.  , Disp: , Rfl:  .  conjugated estrogens (PREMARIN) vaginal cream, Place 1 Applicatorful vaginally daily as needed (irritation). , Disp: , Rfl:  .  dofetilide (TIKOSYN) 500 MCG capsule, TAKE 1 CAPSULE (500 MCG TOTAL) BY MOUTH 2 (TWO) TIMES DAILY., Disp: 180 capsule, Rfl: 3 .  dorzolamide-timolol (COSOPT) 22.3-6.8 MG/ML ophthalmic solution, Place 1 drop into both eyes daily., Disp: , Rfl:  .  famotidine (PEPCID) 20 MG tablet, Take 20 mg by mouth at bedtime., Disp: , Rfl:  .  furosemide (LASIX) 40 MG tablet, Take 1 tablet (40 mg total) by mouth daily as  needed (for worsening swelling or weight gain of 3lbs or more)., Disp: 30 tablet, Rfl: 1 .  gabapentin (NEURONTIN) 300 MG capsule, Take 300 mg by mouth 3 (three) times daily. , Disp: , Rfl:  .  lactobacillus acidophilus (BACID) TABS tablet, Take 1 tablet by mouth daily. , Disp: , Rfl:  .  latanoprost (XALATAN) 0.005 % ophthalmic solution, Place 1 drop into both eyes at bedtime. , Disp: , Rfl: 6 .  losartan (COZAAR) 50 MG tablet, Take 1 tablet (50 mg total) by mouth daily., Disp: 30 tablet, Rfl: 1 .  Multiple Vitamin (MULTIVITAMIN WITH MINERALS) TABS tablet, Take 1 tablet by mouth daily., Disp: , Rfl:  .  NON FORMULARY, Apply 1 application topically 2 (two) times daily as needed (for eczema). Traimcinolone/[facility] Moist Cream, Disp: , Rfl:  .  omeprazole (PRILOSEC) 40 MG capsule, Take 40 mg by mouth daily. , Disp: , Rfl:  .  oxybutynin (DITROPAN-XL) 10 MG 24 hr tablet, Take 10 mg by mouth daily. , Disp: , Rfl:  .  polyethylene glycol (MIRALAX / GLYCOLAX) 17 g packet, Take 17 g by mouth daily as needed for moderate constipation. , Disp: , Rfl:  .  potassium chloride (KLOR-CON) 10 MEQ tablet, Take 1 tablet (10 mEq total) by mouth daily as needed (if you need to take Lasix)., Disp: 30 tablet, Rfl: 2 .  PRESCRIPTION MEDICATION, Inhale into the lungs at bedtime. CPAP, Disp: ,  Rfl:   Past Medical History: Past Medical History:  Diagnosis Date  . Anxiety 06/29/2012  . Aortic regurgitation 08/13/2015  . Arthritis    "hands, wrists, back, feet, toes" (06/24/2017)  . Atrial flutter ([place]) 09/18/2015  . Atypical chest pain 05/13/2016  . Chest pain    remote cath in 1996 with NORMAL coronaries noted  . CHF (congestive heart failure) ([place])   . Dyspnea 10/30/2010  . Essential hypertension 09/05/2013  . Exogenous obesity    history of   . GERD (gastroesophageal reflux disease)   . Glaucoma, both eyes   . Headache, migraine    "stopped in my 50's" (09/18/2015)  . History of cardiovascular stress test 2007    showing no ischemia  . Hypertension   . Mitral stenosis and aortic insufficiency 06/23/2010  . Mitral stenosis with insufficiency   . Mitral stenosis with regurgitation   . Murmur    of mitral stenosis and moderate aortic insufficiency      . Nausea 05/01/2018  . OSA on CPAP   . Palpitations    occasional  . Paroxysmal A-fib ([place]) 06/24/2017  . Pericardial effusion 09/18/2015  . Persistent atrial fibrillation ([place]) 03/10/2017  . Personal history of rheumatic heart disease   . S/P minimally-invasive maze operation for atrial fibrillation 12/13/2018   Complete bilateral atrial lesion set using cryothermy and bipolar radiofrequency ablation with clipping of LA appendage via right mini-thoracotomy approach  . S/P minimally-invasive mitral valve replacement with bioprosthetic valve 12/13/2018   31 mm [facility] Mitral stented bovine pericardial tissue valve  . SBE (subacute bacterial endocarditis)    prophylaxis, patient unaware  . Sliding hiatal hernia   . Vertigo 05/01/2018    Tobacco Use: Social History   Tobacco Use  Smoking Status Never Smoker  Smokeless Tobacco Never Used    Labs: Recent Review Flowsheet Data    Labs for ITP Cardiac and Pulmonary Rehab Latest Ref Rng & Units 12/13/2018 12/13/2018 12/13/2018 12/13/2018 12/14/2018   Hemoglobin A1c 4.8 - 5.6 % - - - - -   PHART 7.350 - 7.450 - 7.495(H) 7.515(H) 7.348(L) 7.372   PCO2ART 32.0 - 48.0 mmHg - 33.9 28.5(L) 42.0 38.1   HCO3 20.0 - 28.0 mmol/L - 26.2 23.4 23.1 22.0   TCO2 22 - 32 mmol/L '26 27 24 24 23   ' ACIDBASEDEF 0.0 - 2.0 mmol/L - - - 2.0 3.0(H)   O2SAT % - 100.0 100.0 99.0 99.0      Capillary Blood Glucose: Lab Results  Component Value Date   GLUCAP 94 01/28/2019   GLUCAP 122 (H) 12/16/2018   GLUCAP 95 12/16/2018   GLUCAP 105 (H) 12/15/2018   GLUCAP 91 12/15/2018     Exercise Target Goals: Exercise Program Goal: Individual exercise prescription set using results from initial 6 min walk test and THRR  while considering  patient's activity barriers and safety.   Exercise Prescription Goal: Initial exercise prescription builds to 30-45 minutes a day of aerobic activity, 2-3 days per week.  Home exercise guidelines will be given to patient during program as part of exercise prescription that the participant will acknowledge.  Activity Barriers & Risk Stratification: Activity Barriers & Cardiac Risk Stratification - 03/22/19 1517      Activity Barriers & Cardiac Risk Stratification   Activity Barriers  Arthritis;Joint Problems;Deconditioning;Balance Concerns    Cardiac Risk Stratification  High       6 Minute Walk: 6 Minute Walk    Row Name 03/22/19 1515  6 Minute Walk   Phase  Initial     Distance  970 feet     Walk Time  6 minutes     # of Rest Breaks  0     MPH  1.84     METS  1.79     RPE  12     Perceived Dyspnea   0     VO2 Peak  6.3     Symptoms  No     Resting HR  73 bpm     Resting BP  154/70     Resting Oxygen Saturation   96 %     Exercise Oxygen Saturation  during 6 min walk  96 %     Max Ex. HR  93 bpm     Max Ex. BP  148/72     2 Minute Post BP  128/76        Oxygen Initial Assessment:   Oxygen Re-Evaluation:   Oxygen Discharge (Final Oxygen Re-Evaluation):   Initial Exercise Prescription: Initial Exercise Prescription - 03/22/19 1500      Date of Initial Exercise RX and Referring Provider   Date  03/22/19    Referring Provider  Skeet Latch MD    Expected Discharge Date  05/18/19      NuStep   Level  2    SPM  75    Minutes  30    METs  2      Prescription Details   Frequency (times per week)  3x    Duration  Progress to 10 minutes continuous walking  at current work load and total walking time to 30-45 min      Intensity   THRR 40-80% of Max Heartrate  58-116    Ratings of Perceived Exertion  11-13    Perceived Dyspnea  0-4      Progression   Progression  Continue progressive overload as per policy without  signs/symptoms or physical distress.      Resistance Training   Training Prescription  Yes    Weight  2lbs    Reps  10-15       Perform Capillary Blood Glucose checks as needed.  Exercise Prescription Changes: Exercise Prescription Changes    Row Name 03/28/19 1500 04/02/19 1345 04/18/19 1352 04/30/19 1353       Response to Exercise   Blood Pressure (Admit)  148/86  124/62  134/68  100/62    Blood Pressure (Exercise)  118/72  112/60  138/74  156/70    Blood Pressure (Exit)  120/70  122/70  122/78  122/64    Heart Rate (Admit)  78 bpm  75 bpm  70 bpm  88 bpm    Heart Rate (Exercise)  80 bpm  84 bpm  77 bpm  96 bpm    Heart Rate (Exit)  73 bpm  75 bpm  67 bpm  69 bpm    Rating of Perceived Exertion (Exercise)  '12  12  11  12    ' Symptoms  None  None  None  None    Comments  Pt first day of exercise.   -  -  -    Duration  Continue with 30 min of aerobic exercise without signs/symptoms of physical distress.  Continue with 30 min of aerobic exercise without signs/symptoms of physical distress.  Continue with 30 min of aerobic exercise without signs/symptoms of physical distress.  Continue with 30 min of aerobic exercise without signs/symptoms of  physical distress.    Intensity  THRR unchanged  THRR unchanged  THRR unchanged  THRR unchanged      Progression   Progression  Continue to progress workloads to maintain intensity without signs/symptoms of physical distress.  Continue to progress workloads to maintain intensity without signs/symptoms of physical distress.  Continue to progress workloads to maintain intensity without signs/symptoms of physical distress.  Continue to progress workloads to maintain intensity without signs/symptoms of physical distress.    Average METs  1.9  2.2  2  2.4      Resistance Training   Training Prescription  No  Yes  No  Yes    Weight  -  2 lbs.   -  3lbs    Reps  -  10-15  -  10-15    Time  -  10 Minutes  -  10 Minutes      Interval Training    Interval Training  No  No  No  No      NuStep   Level  '2  2  2  2    ' SPM  75  75  75  85    Minutes  '30  30  30  30    ' METs  1.9  2.2  2  2.4      Home Exercise Plan   Plans to continue exercise at  -  -  -  Home (comment) Walking    Frequency  -  -  -  Add 2 additional days to program exercise sessions.    Initial Home Exercises Provided  -  -  -  04/30/19       Exercise Comments: Exercise Comments    Row Name 03/28/19 1508 04/05/19 1033 04/20/19 1420 04/30/19 1414     Exercise Comments  Pt first day of exercise. Pt tolerated exercise well.  Pt is progressing well and is doing well so far in the CR program.  Reviewed METs and goals with patient.  Reviewed home exercise guidelines, METs, and goals with patient.       Exercise Goals and Review: Exercise Goals    Row Name 03/22/19 1517             Exercise Goals   Increase Physical Activity  Yes       Intervention  Provide advice, education, support and counseling about physical activity/exercise needs.;Develop an individualized exercise prescription for aerobic and resistive training based on initial evaluation findings, risk stratification, comorbidities and participant's personal goals.       Expected Outcomes  Short Term: Attend rehab on a regular basis to increase amount of physical activity.;Long Term: Add in home exercise to make exercise part of routine and to increase amount of physical activity.;Long Term: Exercising regularly at least 3-5 days a week.       Increase Strength and Stamina  Yes       Intervention  Provide advice, education, support and counseling about physical activity/exercise needs.;Develop an individualized exercise prescription for aerobic and resistive training based on initial evaluation findings, risk stratification, comorbidities and participant's personal goals.       Expected Outcomes  Short Term: Increase workloads from initial exercise prescription for resistance, speed, and METs.;Short Term:  Perform resistance training exercises routinely during rehab and add in resistance training at home;Long Term: Improve cardiorespiratory fitness, muscular endurance and strength as measured by increased METs and functional capacity (6MWT)       Able to understand and use  rate of perceived exertion (RPE) scale  Yes       Intervention  Provide education and explanation on how to use RPE scale       Expected Outcomes  Short Term: Able to use RPE daily in rehab to express subjective intensity level;Long Term:  Able to use RPE to guide intensity level when exercising independently       Knowledge and understanding of Target Heart Rate Range (THRR)  Yes       Intervention  Provide education and explanation of THRR including how the numbers were predicted and where they are located for reference       Expected Outcomes  Short Term: Able to state/look up THRR;Long Term: Able to use THRR to govern intensity when exercising independently;Short Term: Able to use daily as guideline for intensity in rehab       Able to check pulse independently  Yes       Intervention  Provide education and demonstration on how to check pulse in carotid and radial arteries.;Review the importance of being able to check your own pulse for safety during independent exercise       Expected Outcomes  Short Term: Able to explain why pulse checking is important during independent exercise;Long Term: Able to check pulse independently and accurately       Understanding of Exercise Prescription  Yes       Intervention  Provide education, explanation, and written materials on patient's individual exercise prescription       Expected Outcomes  Short Term: Able to explain program exercise prescription;Long Term: Able to explain home exercise prescription to exercise independently          Exercise Goals Re-Evaluation : Exercise Goals Re-Evaluation    Row Name 03/28/19 1506 04/02/19 1345 04/20/19 1420 04/30/19 1414       Exercise Goal  Re-Evaluation   Exercise Goals Review  Increase Physical Activity;Understanding of Exercise Prescription;Increase Strength and Stamina;Able to understand and use rate of perceived exertion (RPE) scale  Increase Physical Activity;Increase Strength and Stamina;Able to understand and use rate of perceived exertion (RPE) scale;Knowledge and understanding of Target Heart Rate Range (THRR);Able to check pulse independently;Understanding of Exercise Prescription  Increase Physical Activity;Increase Strength and Stamina;Able to understand and use rate of perceived exertion (RPE) scale;Knowledge and understanding of Target Heart Rate Range (THRR);Able to check pulse independently;Understanding of Exercise Prescription  Increase Physical Activity;Increase Strength and Stamina;Able to understand and use rate of perceived exertion (RPE) scale;Knowledge and understanding of Target Heart Rate Range (THRR);Able to check pulse independently;Understanding of Exercise Prescription    Comments  Pt first day of exercise in CR program. Pt tolerated exercise well and understands RPE scale.  Pt is new to the CR program. Pt has been porgressing well and keep her SPM above 75 on the seated stepper. Pt has an an average MET level of 2.0.  Patient is doing limited exercise outside of cardiac rehab. Patient states she did some walking on Tuesday & Thursday at the grocery store about 7 minutes. Patient states that walking is limited because of left leg:hip, knee, foot dicomfort. Patient has not discussed with her physician, and I advised her that she cotnact her PCP since she doesn't know the cause of the discomfort. Patient states she thinks it's "old age".  Reviewed home exercise guidelines with patient including endpoints, temperature precautions, target heart rate and rate of perceived exertion. Pt plans to walk as her mode of home exercise. Patient will walk 10  minutes, 3 times/day, 1-2 days at home in addition to exercise at cardiac  rehab. Walking is limited by ongoing left knee/leg pain. Reviewed pulse counting with patient. Pt voices understanding of instructions given.    Expected Outcomes  Will continue to monitor and progress Pt as tolerated.  Will continue to monitor and progress Pt as tolerated.  Increase workloads as able to help increase strength and stamina.  Patient will start walking 1-2 days/week at home as tolerated.       Discharge Exercise Prescription (Final Exercise Prescription Changes): Exercise Prescription Changes - 04/30/19 1353      Response to Exercise   Blood Pressure (Admit)  100/62    Blood Pressure (Exercise)  156/70    Blood Pressure (Exit)  122/64    Heart Rate (Admit)  88 bpm    Heart Rate (Exercise)  96 bpm    Heart Rate (Exit)  69 bpm    Rating of Perceived Exertion (Exercise)  12    Symptoms  None    Duration  Continue with 30 min of aerobic exercise without signs/symptoms of physical distress.    Intensity  THRR unchanged      Progression   Progression  Continue to progress workloads to maintain intensity without signs/symptoms of physical distress.    Average METs  2.4      Resistance Training   Training Prescription  Yes    Weight  3lbs    Reps  10-15    Time  10 Minutes      Interval Training   Interval Training  No      NuStep   Level  2    SPM  85    Minutes  30    METs  2.4      Home Exercise Plan   Plans to continue exercise at  Home (comment)   Walking   Frequency  Add 2 additional days to program exercise sessions.    Initial Home Exercises Provided  04/30/19       Nutrition:  Target Goals: Understanding of nutrition guidelines, daily intake of sodium <1587m, cholesterol <2041m calories 30% from fat and 7% or less from saturated fats, daily to have 5 or more servings of fruits and vegetables.  Biometrics: Pre Biometrics - 03/22/19 1516      Pre Biometrics   Height  4' 11.75" (1.518 m)    Weight  71.4 kg    Waist Circumference  36 inches     Hip Circumference  41 inches    Waist to Hip Ratio  0.88 %    BMI (Calculated)  30.99    Triceps Skinfold  28 mm    % Body Fat  42.4 %    Grip Strength  22 kg    Flexibility  13 in    Single Leg Stand  4.56 seconds        Nutrition Therapy Plan and Nutrition Goals: Nutrition Therapy & Goals - 04/18/19 1446      Nutrition Therapy   Diet  Heart Healthy      Personal Nutrition Goals   Nutrition Goal  Pt to build a healthy plate including vegetables, fruits, whole grains, and low-fat dairy products in a heart healthy meal plan.    Personal Goal #2  Pt to incorporate 1/4 plate of fiber rich starches at meals      Intervention Plan   Intervention  Prescribe, educate and counsel regarding individualized specific dietary modifications aiming towards targeted core components  such as weight, hypertension, lipid management, diabetes, heart failure and other comorbidities.;Nutrition handout(s) given to patient.    Expected Outcomes  Long Term Goal: Adherence to prescribed nutrition plan.;Short Term Goal: A plan has been developed with personal nutrition goals set during dietitian appointment.       Nutrition Assessments: Nutrition Assessments - 04/10/19 1409      MEDFICTS Scores   Pre Score  12       Nutrition Goals Re-Evaluation: Nutrition Goals Re-Evaluation    Row Name 04/18/19 1446             Goals   Current Weight  154 lb (69.9 kg)          Nutrition Goals Re-Evaluation: Nutrition Goals Re-Evaluation    Row Name 04/18/19 1446             Goals   Current Weight  154 lb (69.9 kg)          Nutrition Goals Discharge (Final Nutrition Goals Re-Evaluation): Nutrition Goals Re-Evaluation - 04/18/19 1446      Goals   Current Weight  154 lb (69.9 kg)       Psychosocial: Target Goals: Acknowledge presence or absence of significant depression and/or stress, maximize coping skills, provide positive support system. Participant is able to verbalize types and ability  to use techniques and skills needed for reducing stress and depression.  Initial Review & Psychosocial Screening: Initial Psych Review & Screening - 03/22/19 [place]?  Yes    Comments  Ms. Rouse has a positive attitude and outlook. She is excited about participating in CR and bettering her health. She is married and has 3 adult children. She admits the stress in her life is more managable since her son has been released from prision. States he is doing very well as a productive member of society. She denies psychosocial barriers to participation in CR or self health management. No interventions needed at this time.      Barriers   Psychosocial barriers to participate in program  There are no identifiable barriers or psychosocial needs.      Screening Interventions   Interventions  Encouraged to exercise       Quality of Life Scores: Quality of Life - 03/28/19 1456      Quality of Life   Select  Quality of Life      Quality of Life Scores   Health/Function Pre  13.47 %    Socioeconomic Pre  20.71 %    Psych/Spiritual Pre  17.79 %    Family Pre  18 %    GLOBAL Pre  16.51 %      Scores of 19 and below usually indicate a poorer quality of life in these areas.  A difference of  2-3 points is a clinically meaningful difference.  A difference of 2-3 points in the total score of the Quality of Life Index has been associated with significant improvement in overall quality of life, self-image, physical symptoms, and general health in studies assessing change in quality of life.  PHQ-9: Recent Review Flowsheet Data    Depression screen [facility] 2/9 03/22/2019 06/07/2016   Decreased Interest 0 0   Down, Depressed, Hopeless 0 0   PHQ - 2 Score 0 0     Interpretation of Total Score  Total Score Depression Severity:  1-4 = Minimal depression, 5-9 = Mild depression, 10-14 = Moderate depression, 15-19 = Moderately  severe depression, 20-27 = Severe  depression   Psychosocial Evaluation and Intervention: Psychosocial Evaluation - 03/28/19 1704      Psychosocial Evaluation & Interventions   Interventions  Stress management education;Relaxation education;Encouraged to exercise with the program and follow exercise prescription    Comments  Mykaela reports some stress in her life but does not endorse any psychosocial interventions at this time.  Imajean enjoys reading and playing the piano.    Expected Outcomes  Laloni will maintain a positive outlook with good coping skills and report continued management of her stress.    Continue Psychosocial Services   No Follow up required       Psychosocial Re-Evaluation: Psychosocial Re-Evaluation    [place] Name 04/04/19 1143 04/30/19 1712           Psychosocial Re-Evaluation   Current issues with  Current Stress Concerns  Current Stress Concerns      Comments  Agatha reports some stress in her life but feels that she is able to manage it.  Jayelle continues to report ability to manage the stress in her life.  No psychosocial needs at this time.      Expected Outcomes  Joory will continue to report ability to manage her stress.  Tamsin will continue to report ability to manage her stress.      Interventions  Encouraged to attend Cardiac Rehabilitation for the exercise  Encouraged to attend Cardiac Rehabilitation for the exercise      Continue Psychosocial Services   Follow up required by staff  Follow up required by staff         Psychosocial Discharge (Final Psychosocial Re-Evaluation): Psychosocial Re-Evaluation - 04/30/19 1712      Psychosocial Re-Evaluation   Current issues with  Current Stress Concerns    Comments  Mariangel continues to report ability to manage the stress in her life.  No psychosocial needs at this time.    Expected Outcomes  Roxi will continue to report ability to manage her stress.    Interventions  Encouraged to attend Cardiac Rehabilitation for the exercise    Continue Psychosocial Services    Follow up required by staff       Vocational Rehabilitation: Provide vocational rehab assistance to qualifying candidates.   Vocational Rehab Evaluation & Intervention: Vocational Rehab - 03/22/19 1451      Initial Vocational Rehab Evaluation & Intervention   Assessment shows need for Vocational Rehabilitation  No       Education: Education Goals: Education classes will be provided on a weekly basis, covering required topics. Participant will state understanding/return demonstration of topics presented.  Learning Barriers/Preferences: Learning Barriers/Preferences - 03/22/19 1521      Learning Barriers/Preferences   Learning Barriers  Sight    Learning Preferences  Skilled Demonstration;Written Material       Education Topics: Count Your Pulse:  -Group instruction provided by verbal instruction, demonstration, patient participation and written materials to support subject.  Instructors address importance of being able to find your pulse and how to count your pulse when at home without a heart monitor.  Patients get hands on experience counting their pulse with staff help and individually.   Heart Attack, Angina, and Risk Factor Modification:  -Group instruction provided by verbal instruction, video, and written materials to support subject.  Instructors address signs and symptoms of angina and heart attacks.    Also discuss risk factors for heart disease and how to make changes to improve heart health risk factors.  Functional Fitness:  -Group instruction provided by verbal instruction, demonstration, patient participation, and written materials to support subject.  Instructors address safety measures for doing things around the house.  Discuss how to get up and down off the floor, how to pick things up properly, how to safely get out of a chair without assistance, and balance training.   Meditation and Mindfulness:  -Group instruction provided by verbal instruction, patient  participation, and written materials to support subject.  Instructor addresses importance of mindfulness and meditation practice to help reduce stress and improve awareness.  Instructor also leads participants through a meditation exercise.    Stretching for Flexibility and Mobility:  -Group instruction provided by verbal instruction, patient participation, and written materials to support subject.  Instructors lead participants through series of stretches that are designed to increase flexibility thus improving mobility.  These stretches are additional exercise for major muscle groups that are typically performed during regular warm up and cool down.   Hands Only CPR:  -Group verbal, video, and participation provides a basic overview of AHA guidelines for community CPR. Role-play of emergencies allow participants the opportunity to practice calling for help and chest compression technique with discussion of AED use.   Hypertension: -Group verbal and written instruction that provides a basic overview of hypertension including the most recent diagnostic guidelines, risk factor reduction with self-care instructions and medication management.    Nutrition I class: Heart Healthy Eating:  -Group instruction provided by PowerPoint slides, verbal discussion, and written materials to support subject matter. The instructor gives an explanation and review of the Therapeutic Lifestyle Changes diet recommendations, which includes a discussion on lipid goals, dietary fat, sodium, fiber, plant stanol/sterol esters, sugar, and the components of a well-balanced, healthy diet.   Nutrition II class: Lifestyle Skills:  -Group instruction provided by PowerPoint slides, verbal discussion, and written materials to support subject matter. The instructor gives an explanation and review of label reading, grocery shopping for heart health, heart healthy recipe modifications, and ways to make healthier choices when eating  out.   Diabetes Question & Answer:  -Group instruction provided by PowerPoint slides, verbal discussion, and written materials to support subject matter. The instructor gives an explanation and review of diabetes co-morbidities, pre- and post-prandial blood glucose goals, pre-exercise blood glucose goals, signs, symptoms, and treatment of hypoglycemia and hyperglycemia, and foot care basics.   Diabetes Blitz:  -Group instruction provided by PowerPoint slides, verbal discussion, and written materials to support subject matter. The instructor gives an explanation and review of the physiology behind type 1 and type 2 diabetes, diabetes medications and rational behind using different medications, pre- and post-prandial blood glucose recommendations and Hemoglobin A1c goals, diabetes diet, and exercise including blood glucose guidelines for exercising safely.    Portion Distortion:  -Group instruction provided by PowerPoint slides, verbal discussion, written materials, and food models to support subject matter. The instructor gives an explanation of serving size versus portion size, changes in portions sizes over the last 20 years, and what consists of a serving from each food group.   Stress Management:  -Group instruction provided by verbal instruction, video, and written materials to support subject matter.  Instructors review role of stress in heart disease and how to cope with stress positively.     Exercising on Your Own:  -Group instruction provided by verbal instruction, power point, and written materials to support subject.  Instructors discuss benefits of exercise, components of exercise, frequency and intensity of exercise, and end points  for exercise.  Also discuss use of nitroglycerin and activating EMS.  Review options of places to exercise outside of rehab.  Review guidelines for sex with heart disease.   Cardiac Drugs I:  -Group instruction provided by verbal instruction and  written materials to support subject.  Instructor reviews cardiac drug classes: antiplatelets, anticoagulants, beta blockers, and statins.  Instructor discusses reasons, side effects, and lifestyle considerations for each drug class.   Cardiac Drugs II:  -Group instruction provided by verbal instruction and written materials to support subject.  Instructor reviews cardiac drug classes: angiotensin converting enzyme inhibitors (ACE-I), angiotensin II receptor blockers (ARBs), nitrates, and calcium channel blockers.  Instructor discusses reasons, side effects, and lifestyle considerations for each drug class.   Anatomy and Physiology of the Circulatory System:  Group verbal and written instruction and models provide basic cardiac anatomy and physiology, with the coronary electrical and arterial systems. Review of: AMI, Angina, Valve disease, Heart Failure, Peripheral Artery Disease, Cardiac Arrhythmia, Pacemakers, and the ICD.   Other Education:  -Group or individual verbal, written, or video instructions that support the educational goals of the cardiac rehab program.   Holiday Eating Survival Tips:  -Group instruction provided by PowerPoint slides, verbal discussion, and written materials to support subject matter. The instructor gives patients tips, tricks, and techniques to help them not only survive but enjoy the holidays despite the onslaught of food that accompanies the holidays.   Knowledge Questionnaire Score: Knowledge Questionnaire Score - 03/28/19 1456      Knowledge Questionnaire Score   Pre Score  20/24       Core Components/Risk Factors/Patient Goals at Admission: Personal Goals and Risk Factors at Admission - 03/22/19 1520      Core Components/Risk Factors/Patient Goals on Admission   Hypertension  Yes    Intervention  Provide education on lifestyle modifcations including regular physical activity/exercise, weight management, moderate sodium restriction and increased  consumption of fresh fruit, vegetables, and low fat dairy, alcohol moderation, and smoking cessation.;Monitor prescription use compliance.    Expected Outcomes  Short Term: Continued assessment and intervention until BP is < 140/52m HG in hypertensive participants. < 130/858mHG in hypertensive participants with diabetes, heart failure or chronic kidney disease.;Long Term: Maintenance of blood pressure at goal levels.       Core Components/Risk Factors/Patient Goals Review:  Goals and Risk Factor Review    Row Name 03/28/19 1706 04/30/19 1713           Core Components/Risk Factors/Patient Goals Review   Personal Goals Review  Hypertension  Hypertension      Review  Pt with few CAD RFs willing to participate in CR exercise.  KaKyieshaould like to increase her confidence in her health as well as her strength and stamina.  Pt with few CAD RFs willing to participate in CR exercise.  Colby contiues to tolerate exercise well.  She has been able to increase her workloads.  VSS.      Expected Outcomes  KaQuaneishaill continue to participate in CR exercise, nutrition, and lifestyle modification opportunitites.  KaAnnieceill continue to participate in CR exercise, nutrition, and lifestyle modification opportunitites.         Core Components/Risk Factors/Patient Goals at Discharge (Final Review):  Goals and Risk Factor Review - 04/30/19 1713      Core Components/Risk Factors/Patient Goals Review   Personal Goals Review  Hypertension    Review  Pt with few CAD RFs willing to participate in CR exercise.  KaZigmund Daniel  contiues to tolerate exercise well.  She has been able to increase her workloads.  VSS.    Expected Outcomes  Tylee will continue to participate in CR exercise, nutrition, and lifestyle modification opportunitites.       ITP Comments: ITP Comments    Row Name 03/22/19 1406 03/28/19 1651 04/30/19 1712       ITP Comments  Dr. Fransico Him, Medical Director [facility]  30 Day ITP Review.  Pt started  exercise today and tolerated it well.  30 Day ITP Review.  Sharee contiues to tolerate exercise well.  She has been able to increase her workloads.  VSS.        Comments: See ITP Comments.

## 2019-05-04 ENCOUNTER — Encounter (HOSPITAL_COMMUNITY)
Admission: RE | Admit: 2019-05-04 | Discharge: 2019-05-04 | Disposition: A | Discharge status: Home / Self Care | Payer: Medicare Other | Source: Ambulatory Visit | Attending: Cardiovascular Disease | Admitting: Cardiovascular Disease

## 2019-05-04 ENCOUNTER — Other Ambulatory Visit: Payer: Self-pay

## 2019-05-04 DIAGNOSIS — Z953 Presence of xenogenic heart valve: Secondary | ICD-10-CM

## 2019-05-04 DIAGNOSIS — Z9889 Other specified postprocedural states: Secondary | ICD-10-CM | POA: Diagnosis not present

## 2019-05-04 DIAGNOSIS — Z8679 Personal history of other diseases of the circulatory system: Secondary | ICD-10-CM

## 2019-05-07 ENCOUNTER — Other Ambulatory Visit: Payer: Self-pay

## 2019-05-07 ENCOUNTER — Encounter (HOSPITAL_COMMUNITY)
Admission: RE | Admit: 2019-05-07 | Discharge: 2019-05-07 | Disposition: A | Discharge status: Home / Self Care | Payer: Medicare Other | Source: Ambulatory Visit | Attending: Cardiovascular Disease | Admitting: Cardiovascular Disease

## 2019-05-07 DIAGNOSIS — Z953 Presence of xenogenic heart valve: Secondary | ICD-10-CM

## 2019-05-07 DIAGNOSIS — Z9889 Other specified postprocedural states: Secondary | ICD-10-CM | POA: Diagnosis not present

## 2019-05-09 ENCOUNTER — Other Ambulatory Visit: Payer: Self-pay

## 2019-05-09 ENCOUNTER — Encounter (HOSPITAL_COMMUNITY)
Admission: RE | Admit: 2019-05-09 | Discharge: 2019-05-09 | Disposition: A | Discharge status: Home / Self Care | Payer: Medicare Other | Source: Ambulatory Visit | Attending: Cardiovascular Disease | Admitting: Cardiovascular Disease

## 2019-05-09 DIAGNOSIS — Z9889 Other specified postprocedural states: Secondary | ICD-10-CM | POA: Diagnosis not present

## 2019-05-09 DIAGNOSIS — Z953 Presence of xenogenic heart valve: Secondary | ICD-10-CM

## 2019-05-11 ENCOUNTER — Other Ambulatory Visit: Payer: Self-pay

## 2019-05-11 ENCOUNTER — Encounter (HOSPITAL_COMMUNITY)
Admission: RE | Admit: 2019-05-11 | Discharge: 2019-05-11 | Disposition: A | Discharge status: Home / Self Care | Payer: Medicare Other | Source: Ambulatory Visit | Attending: Cardiovascular Disease | Admitting: Cardiovascular Disease

## 2019-05-11 DIAGNOSIS — Z8679 Personal history of other diseases of the circulatory system: Secondary | ICD-10-CM

## 2019-05-11 DIAGNOSIS — Z9889 Other specified postprocedural states: Secondary | ICD-10-CM | POA: Diagnosis not present

## 2019-05-11 DIAGNOSIS — Z953 Presence of xenogenic heart valve: Secondary | ICD-10-CM

## 2019-05-14 ENCOUNTER — Other Ambulatory Visit: Payer: Self-pay

## 2019-05-14 ENCOUNTER — Telehealth: Payer: Self-pay | Admitting: Cardiovascular Disease

## 2019-05-14 ENCOUNTER — Encounter (HOSPITAL_COMMUNITY)
Admission: RE | Admit: 2019-05-14 | Discharge: 2019-05-14 | Disposition: A | Discharge status: Home / Self Care | Payer: Medicare Other | Source: Ambulatory Visit | Attending: Cardiovascular Disease | Admitting: Cardiovascular Disease

## 2019-05-14 DIAGNOSIS — Z8679 Personal history of other diseases of the circulatory system: Secondary | ICD-10-CM

## 2019-05-14 DIAGNOSIS — Z953 Presence of xenogenic heart valve: Secondary | ICD-10-CM

## 2019-05-14 DIAGNOSIS — Z9889 Other specified postprocedural states: Secondary | ICD-10-CM | POA: Diagnosis not present

## 2019-05-14 NOTE — Telephone Encounter (Signed)
Left message for patient to call and schedule 2 month follow up with Dr. Oval Linsey

## 2019-05-16 ENCOUNTER — Other Ambulatory Visit: Payer: Self-pay

## 2019-05-16 ENCOUNTER — Encounter (HOSPITAL_COMMUNITY)
Admission: RE | Admit: 2019-05-16 | Discharge: 2019-05-16 | Disposition: A | Discharge status: Home / Self Care | Payer: Medicare Other | Source: Ambulatory Visit | Attending: Cardiovascular Disease | Admitting: Cardiovascular Disease

## 2019-05-16 DIAGNOSIS — Z8679 Personal history of other diseases of the circulatory system: Secondary | ICD-10-CM

## 2019-05-16 DIAGNOSIS — Z953 Presence of xenogenic heart valve: Secondary | ICD-10-CM

## 2019-05-16 DIAGNOSIS — Z9889 Other specified postprocedural states: Secondary | ICD-10-CM | POA: Diagnosis not present

## 2019-05-17 ENCOUNTER — Telehealth: Payer: Self-pay | Admitting: *Deleted

## 2019-05-17 DIAGNOSIS — Z5181 Encounter for therapeutic drug level monitoring: Secondary | ICD-10-CM

## 2019-05-17 DIAGNOSIS — I10 Essential (primary) hypertension: Secondary | ICD-10-CM

## 2019-05-17 NOTE — Telephone Encounter (Signed)
Spoke with patient and she has increased Losartan to 50 mg daily 1) Blood pressure is running more above 130/80 than below.   2) Had a pain in chest this am that came on doing regular activities, lasted around 2-3 hours. Pain would come and go Did have some nausea but wakes up with nausea, goes away after breakfast. Did last longer today   Will forward to Dr Oval Linsey for review

## 2019-05-18 ENCOUNTER — Other Ambulatory Visit: Payer: Self-pay

## 2019-05-18 ENCOUNTER — Encounter (HOSPITAL_COMMUNITY)
Admission: RE | Admit: 2019-05-18 | Discharge: 2019-05-18 | Disposition: A | Discharge status: Home / Self Care | Payer: Medicare Other | Source: Ambulatory Visit | Attending: Cardiovascular Disease | Admitting: Cardiovascular Disease

## 2019-05-18 DIAGNOSIS — Z9889 Other specified postprocedural states: Secondary | ICD-10-CM | POA: Diagnosis not present

## 2019-05-18 DIAGNOSIS — Z953 Presence of xenogenic heart valve: Secondary | ICD-10-CM

## 2019-05-18 NOTE — Telephone Encounter (Signed)
Left message, mychart message sent

## 2019-05-18 NOTE — Telephone Encounter (Signed)
Increase losartan to 100mg .  BMp in a week or two.  She has normal coronary arteries.  ?GERD.  F/u with PCP about this.

## 2019-05-21 ENCOUNTER — Encounter (HOSPITAL_COMMUNITY)
Admission: RE | Admit: 2019-05-21 | Discharge: 2019-05-21 | Disposition: A | Discharge status: Home / Self Care | Payer: Medicare Other | Source: Ambulatory Visit | Attending: Cardiovascular Disease | Admitting: Cardiovascular Disease

## 2019-05-21 ENCOUNTER — Other Ambulatory Visit: Payer: Self-pay

## 2019-05-21 DIAGNOSIS — Z9889 Other specified postprocedural states: Secondary | ICD-10-CM | POA: Diagnosis not present

## 2019-05-21 DIAGNOSIS — Z953 Presence of xenogenic heart valve: Secondary | ICD-10-CM

## 2019-05-21 MED ORDER — LOSARTAN POTASSIUM 100 MG PO TABS: 100.0000 mg | ORAL_TABLET | Freq: Every day | ORAL | 3 refills | Status: DC

## 2019-05-23 ENCOUNTER — Encounter (HOSPITAL_COMMUNITY)
Admission: RE | Admit: 2019-05-23 | Discharge: 2019-05-23 | Disposition: A | Discharge status: Home / Self Care | Payer: Medicare Other | Source: Ambulatory Visit | Attending: Cardiovascular Disease | Admitting: Cardiovascular Disease

## 2019-05-23 ENCOUNTER — Other Ambulatory Visit: Payer: Self-pay

## 2019-05-23 DIAGNOSIS — Z953 Presence of xenogenic heart valve: Secondary | ICD-10-CM

## 2019-05-23 DIAGNOSIS — Z9889 Other specified postprocedural states: Secondary | ICD-10-CM | POA: Diagnosis not present

## 2019-05-24 ENCOUNTER — Other Ambulatory Visit: Payer: Self-pay | Admitting: Cardiovascular Disease

## 2019-05-24 NOTE — Telephone Encounter (Signed)
Patient viewed in mychart and made medication change

## 2019-05-25 ENCOUNTER — Other Ambulatory Visit: Payer: Self-pay

## 2019-05-25 ENCOUNTER — Encounter (HOSPITAL_COMMUNITY)
Admission: RE | Admit: 2019-05-25 | Discharge: 2019-05-25 | Disposition: A | Discharge status: Home / Self Care | Payer: Medicare Other | Source: Ambulatory Visit | Attending: Cardiovascular Disease | Admitting: Cardiovascular Disease

## 2019-05-25 VITALS — BP 130/78 | HR 67 | Temp 97.9°F | Ht 59.75 in | Wt 155.4 lb

## 2019-05-25 DIAGNOSIS — Z953 Presence of xenogenic heart valve: Secondary | ICD-10-CM

## 2019-05-25 DIAGNOSIS — Z8679 Personal history of other diseases of the circulatory system: Secondary | ICD-10-CM

## 2019-05-25 DIAGNOSIS — Z9889 Other specified postprocedural states: Secondary | ICD-10-CM | POA: Diagnosis not present

## 2019-05-25 NOTE — Progress Notes (Signed)
Discharge Progress Report  Patient Details  Name: Mary Farrell MRN: [id number] Date of Birth: [birth year] Referring Provider:     [facility] from 03/22/2019 in [place]  Referring Provider  Skeet Latch MD       Number of Visits: 24  Reason for Discharge:  Patient reached a stable level of exercise. Patient independent in their exercise. Patient has met program and personal goals.  Smoking History:  Social History   Tobacco Use  Smoking Status Never Smoker  Smokeless Tobacco Never Used    Diagnosis:  S/P minimally-invasive maze operation for atrial fibrillation  S/P minimally-invasive mitral valve replacement with bioprosthetic valve + maze procedure  ADL UCSD:   Initial Exercise Prescription: Initial Exercise Prescription - 03/22/19 1500      Date of Initial Exercise RX and Referring Provider   Date  03/22/19    Referring Provider  Skeet Latch MD    Expected Discharge Date  05/18/19      NuStep   Level  2    SPM  75    Minutes  30    METs  2      Prescription Details   Frequency (times per week)  3x    Duration  Progress to 10 minutes continuous walking  at current work load and total walking time to 30-45 min      Intensity   THRR 40-80% of Max Heartrate  58-116    Ratings of Perceived Exertion  11-13    Perceived Dyspnea  0-4      Progression   Progression  Continue progressive overload as per policy without signs/symptoms or physical distress.      Resistance Training   Training Prescription  Yes    Weight  2lbs    Reps  10-15       Discharge Exercise Prescription (Final Exercise Prescription Changes): Exercise Prescription Changes - 05/25/19 1348      Response to Exercise   Blood Pressure (Admit)  130/78    Blood Pressure (Exercise)  138/72    Blood Pressure (Exit)  120/60    Heart Rate (Admit)  67 bpm    Heart Rate (Exercise)  82 bpm    Heart Rate (Exit)  67 bpm     Rating of Perceived Exertion (Exercise)  13    Symptoms  None    Duration  Continue with 30 min of aerobic exercise without signs/symptoms of physical distress.    Intensity  THRR unchanged      Progression   Progression  Continue to progress workloads to maintain intensity without signs/symptoms of physical distress.    Average METs  2.8      Resistance Training   Training Prescription  Yes    Weight  3lbs    Reps  10-15    Time  10 Minutes      Interval Training   Interval Training  No      NuStep   Level  4    SPM  85    Minutes  30    METs  2.8      Home Exercise Plan   Plans to continue exercise at  Home (comment)   Walking   Frequency  Add 2 additional days to program exercise sessions.    Initial Home Exercises Provided  04/30/19       Functional Capacity: 6 Minute Walk    Row Name 03/22/19 1515 05/09/19 1452  6 Minute Walk   Phase  Initial  Discharge    Distance  970 feet  1082 feet    Distance % Change  --  11.55 %    Distance Feet Change  --  112 ft    Walk Time  6 minutes  6 minutes    # of Rest Breaks  0  0    MPH  1.84  2.05    METS  1.79  1.81    RPE  12  13    Perceived Dyspnea   0  0    VO2 Peak  6.3  6.35    Symptoms  No  Yes (comment)    Comments  --  Patient c/o left leg pain, which is chronic for her, from IT band inflammation, 4/10 on the pain scale.    Resting HR  73 bpm  67 bpm    Resting BP  154/70  100/60    Resting Oxygen Saturation   96 %  --    Exercise Oxygen Saturation  during 6 min walk  96 %  --    Max Ex. HR  93 bpm  80 bpm    Max Ex. BP  148/72  140/78    2 Minute Post BP  128/76  122/60       Psychological, QOL, Others - Outcomes: PHQ 2/9: Depression screen [facility] 2/9 03/22/2019 06/07/2016  Decreased Interest 0 0  Down, Depressed, Hopeless 0 0  PHQ - 2 Score 0 0  Some recent data might be hidden    Quality of Life: Quality of Life - 05/16/19 1645      Quality of Life   Select  Quality of Life      Quality  of Life Scores   Health/Function Pre  13.47 %    Health/Function Post  18.57 %    Health/Function % Change  37.86 %    Socioeconomic Pre  20.71 %    Socioeconomic Post  21 %    Socioeconomic % Change   1.4 %    Psych/Spiritual Pre  17.79 %    Psych/Spiritual Post  22.64 %    Psych/Spiritual % Change  27.26 %    Family Pre  18 %    Family Post  25.2 %    Family % Change  40 %    GLOBAL Pre  16.51 %    GLOBAL Post  20.88 %    GLOBAL % Change  26.47 %       Personal Goals: Goals established at orientation with interventions provided to work toward goal. Personal Goals and Risk Factors at Admission - 03/22/19 1520      Core Components/Risk Factors/Patient Goals on Admission   Hypertension  Yes    Intervention  Provide education on lifestyle modifcations including regular physical activity/exercise, weight management, moderate sodium restriction and increased consumption of fresh fruit, vegetables, and low fat dairy, alcohol moderation, and smoking cessation.;Monitor prescription use compliance.    Expected Outcomes  Short Term: Continued assessment and intervention until BP is < 140/94m HG in hypertensive participants. < 130/813mHG in hypertensive participants with diabetes, heart failure or chronic kidney disease.;Long Term: Maintenance of blood pressure at goal levels.        Personal Goals Discharge: Goals and Risk Factor Review    Row Name 03/28/19 1706 04/30/19 1713 05/30/19 1044         Core Components/Risk Factors/Patient Goals Review   Personal Goals Review  Hypertension  Hypertension  --     Review  Pt with few CAD RFs willing to participate in CR exercise.  Jaree would like to increase her confidence in her health as well as her strength and stamina.  Pt with few CAD RFs willing to participate in CR exercise.  Gracielynn contiues to tolerate exercise well.  She has been able to increase her workloads.  VSS.  Pt with few CAD RFs willing to participate in CR exercise.  Cambell contiues  to tolerate exercise well.  She has been able to increase her workloads.  VSS.     Expected Outcomes  Cele will continue to participate in CR exercise, nutrition, and lifestyle modification opportunitites.  Sheriden will continue to participate in CR exercise, nutrition, and lifestyle modification opportunitites.  Letetia will continue to follow her home exercise prescription and lifestyle modifications learned in CR at discharge.        Exercise Goals and Review: Exercise Goals    Row Name 03/22/19 1517             Exercise Goals   Increase Physical Activity  Yes       Intervention  Provide advice, education, support and counseling about physical activity/exercise needs.;Develop an individualized exercise prescription for aerobic and resistive training based on initial evaluation findings, risk stratification, comorbidities and participant's personal goals.       Expected Outcomes  Short Term: Attend rehab on a regular basis to increase amount of physical activity.;Long Term: Add in home exercise to make exercise part of routine and to increase amount of physical activity.;Long Term: Exercising regularly at least 3-5 days a week.       Increase Strength and Stamina  Yes       Intervention  Provide advice, education, support and counseling about physical activity/exercise needs.;Develop an individualized exercise prescription for aerobic and resistive training based on initial evaluation findings, risk stratification, comorbidities and participant's personal goals.       Expected Outcomes  Short Term: Increase workloads from initial exercise prescription for resistance, speed, and METs.;Short Term: Perform resistance training exercises routinely during rehab and add in resistance training at home;Long Term: Improve cardiorespiratory fitness, muscular endurance and strength as measured by increased METs and functional capacity (6MWT)       Able to understand and use rate of perceived exertion (RPE) scale   Yes       Intervention  Provide education and explanation on how to use RPE scale       Expected Outcomes  Short Term: Able to use RPE daily in rehab to express subjective intensity level;Long Term:  Able to use RPE to guide intensity level when exercising independently       Knowledge and understanding of Target Heart Rate Range (THRR)  Yes       Intervention  Provide education and explanation of THRR including how the numbers were predicted and where they are located for reference       Expected Outcomes  Short Term: Able to state/look up THRR;Long Term: Able to use THRR to govern intensity when exercising independently;Short Term: Able to use daily as guideline for intensity in rehab       Able to check pulse independently  Yes       Intervention  Provide education and demonstration on how to check pulse in carotid and radial arteries.;Review the importance of being able to check your own pulse for safety during independent exercise  Expected Outcomes  Short Term: Able to explain why pulse checking is important during independent exercise;Long Term: Able to check pulse independently and accurately       Understanding of Exercise Prescription  Yes       Intervention  Provide education, explanation, and written materials on patient's individual exercise prescription       Expected Outcomes  Short Term: Able to explain program exercise prescription;Long Term: Able to explain home exercise prescription to exercise independently          Exercise Goals Re-Evaluation: Exercise Goals Re-Evaluation    Row Name 03/28/19 1506 04/02/19 1345 04/20/19 1420 04/30/19 1414 05/09/19 1405     Exercise Goal Re-Evaluation   Exercise Goals Review  Increase Physical Activity;Understanding of Exercise Prescription;Increase Strength and Stamina;Able to understand and use rate of perceived exertion (RPE) scale  Increase Physical Activity;Increase Strength and Stamina;Able to understand and use rate of perceived  exertion (RPE) scale;Knowledge and understanding of Target Heart Rate Range (THRR);Able to check pulse independently;Understanding of Exercise Prescription  Increase Physical Activity;Increase Strength and Stamina;Able to understand and use rate of perceived exertion (RPE) scale;Knowledge and understanding of Target Heart Rate Range (THRR);Able to check pulse independently;Understanding of Exercise Prescription  Increase Physical Activity;Increase Strength and Stamina;Able to understand and use rate of perceived exertion (RPE) scale;Knowledge and understanding of Target Heart Rate Range (THRR);Able to check pulse independently;Understanding of Exercise Prescription  Increase Physical Activity;Increase Strength and Stamina;Able to understand and use rate of perceived exertion (RPE) scale;Knowledge and understanding of Target Heart Rate Range (THRR);Able to check pulse independently;Understanding of Exercise Prescription   Comments  Pt first day of exercise in CR program. Pt tolerated exercise well and understands RPE scale.  Pt is new to the CR program. Pt has been porgressing well and keep her SPM above 75 on the seated stepper. Pt has an an average MET level of 2.0.  Patient is doing limited exercise outside of cardiac rehab. Patient states she did some walking on Tuesday & Thursday at the grocery store about 7 minutes. Patient states that walking is limited because of left leg:hip, knee, foot dicomfort. Patient has not discussed with her physician, and I advised her that she cotnact her PCP since she doesn't know the cause of the discomfort. Patient states she thinks it's "old age".  Reviewed home exercise guidelines with patient including endpoints, temperature precautions, target heart rate and rate of perceived exertion. Pt plans to walk as her mode of home exercise. Patient will walk 10 minutes, 3 times/day, 1-2 days at home in addition to exercise at cardiac rehab. Walking is limited by ongoing left knee/leg  pain. Reviewed pulse counting with patient. Pt voices understanding of instructions given.  Patient's functional capacity increased 11.55% as measured by 6MWT, strength increased 6.82%, and flexibility increased 3.85%. Patient has a treadmill at home and total gym that she can use for her exercise upon completion of the cardiac rehab program. Patient's walking is limited by chronic leg pain, so we discussed walking 10 minutes 3x's/day or 15 minutes 2x's/day, and patient is amenable to this.   Expected Outcomes  Will continue to monitor and progress Pt as tolerated.  Will continue to monitor and progress Pt as tolerated.  Increase workloads as able to help increase strength and stamina.  Patient will start walking 1-2 days/week at home as tolerated.  Patient will walk on her treadmill at home or her total gym as her mode of exercise after completing the cardiac rehab program.  [place] Name 05/29/19 1630             Exercise Goal Re-Evaluation   Exercise Goals Review  Increase Physical Activity;Increase Strength and Stamina;Able to understand and use rate of perceived exertion (RPE) scale;Knowledge and understanding of Target Heart Rate Range (THRR);Able to check pulse independently;Understanding of Exercise Prescription       Comments  Patient completed the cardiac rehab program on 05/25/19 and will continue exercise at home walking as tolerated outside or on her treadmill at home and using her total gym.       Expected Outcomes  Patient will exercise 30 minutes 4-5 days/week to maintain health and fitness gains.          Nutrition & Weight - Outcomes: Pre Biometrics - 03/22/19 1516      Pre Biometrics   Height  4' 11.75" (1.518 m)    Weight  71.4 kg    Waist Circumference  36 inches    Hip Circumference  41 inches    Waist to Hip Ratio  0.88 %    BMI (Calculated)  30.99    Triceps Skinfold  28 mm    % Body Fat  42.4 %    Grip Strength  22 kg    Flexibility  13 in    Single Leg Stand  4.56  seconds      Post Biometrics - 05/25/19 1348       Post  Biometrics   Height  4' 11.75" (1.518 m)    Weight  70.5 kg    Waist Circumference  33.75 inches    Hip Circumference  42.25 inches    Waist to Hip Ratio  0.8 %    BMI (Calculated)  30.59    Triceps Skinfold  33 mm    % Body Fat  42.2 %    Grip Strength  23.5 kg    Flexibility  13.5 in    Single Leg Stand  5.5 seconds       Nutrition: Nutrition Therapy & Goals - 04/18/19 1446      Nutrition Therapy   Diet  Heart Healthy      Personal Nutrition Goals   Nutrition Goal  Pt to build a healthy plate including vegetables, fruits, whole grains, and low-fat dairy products in a heart healthy meal plan.    Personal Goal #2  Pt to incorporate 1/4 plate of fiber rich starches at meals      Intervention Plan   Intervention  Prescribe, educate and counsel regarding individualized specific dietary modifications aiming towards targeted core components such as weight, hypertension, lipid management, diabetes, heart failure and other comorbidities.;Nutrition handout(s) given to patient.    Expected Outcomes  Long Term Goal: Adherence to prescribed nutrition plan.;Short Term Goal: A plan has been developed with personal nutrition goals set during dietitian appointment.       Nutrition Discharge: Nutrition Assessments - 05/30/19 1007      MEDFICTS Scores   Post Score  21       Education Questionnaire Score: Knowledge Questionnaire Score - 05/16/19 1646      Knowledge Questionnaire Score   Pre Score  20/24    Post Score  24/24       Goals reviewed with patient; copy given to patient.

## 2019-05-26 ENCOUNTER — Telehealth: Payer: Self-pay | Admitting: Student

## 2019-05-26 NOTE — Telephone Encounter (Signed)
   The patient called the after-hours line reporting she felt as if she were back in atrial fibrillation this morning.  At 0800, her BP was 150/70 and HR was 68. She developed palpitations and dyspnea around 0900 and BP was stable at 130/89 but heart rate was in the 110's. She does have a Kardia device and this showed she was in possible atrial fibrillation. She denies any symptoms over the past several months and says her atrial fibrillation had overall been doing well. She is on Tikosyn, Coreg and Eliquis. Denies missing any recent doses.  She was previously on Cardizem CD but this was discontinued earlier this year due to thinking she did not need this along with Coreg. She still has Cardizem at home and I recommended she take a Cardizem CD 120 mg tablet. If symptoms do not improve or her heart rate remains elevated, I advised her to go to the emergency department for further evaluation as she might be a candidate for DCCV given compliance with anticoagulation. If she does convert to normal sinus rhythm with Cardizem, I recommended she continue on this for now until the time of her follow-up visit later this month and continue to track HR and BP in the interim as she had dizziness with this in the past.    She voiced understanding of this and was appreciative of the return call.  Signed, Erma Heritage, PA-C 05/26/2019, 10:00 AM Pager: [phone]

## 2019-05-27 NOTE — Telephone Encounter (Signed)
Agree with restarting diltiazam.  Recommend afib clinic appointment this week.

## 2019-05-31 ENCOUNTER — Other Ambulatory Visit: Payer: Self-pay | Admitting: Student

## 2019-06-08 LAB — BASIC METABOLIC PANEL
BUN/Creatinine Ratio: 14 (ref 12–28)
BUN: 11 mg/dL (ref 8–27)
CO2: 25 mmol/L (ref 20–29)
Calcium: 9.2 mg/dL (ref 8.7–10.3)
Chloride: 100 mmol/L (ref 96–106)
Creatinine, Ser: 0.78 mg/dL (ref 0.57–1.00)
GFR calc Af Amer: 86 mL/min/{1.73_m2} (ref >59)
GFR calc non Af Amer: 75 mL/min/{1.73_m2} (ref >59)
Glucose: 82 mg/dL (ref 65–99)
Potassium: 4 mmol/L (ref 3.5–5.2)
Sodium: 140 mmol/L (ref 134–144)

## 2019-06-09 ENCOUNTER — Other Ambulatory Visit: Payer: Self-pay | Admitting: Surgical

## 2019-06-10 NOTE — Progress Notes (Signed)
Cardiology Office Note:    Date:  06/19/2019   ID:  Mary Farrell, DOB [birth year], MRN [id number]  PCP:  Mary Cruel, MD  Cardiologist:  Mary Latch, MD  Electrophysiologist:  Mary Haw, MD   Referring MD: Mary Cruel, MD   Chief Complaint: follow-up atrial fibrillation/flutter and MVR  History of Present Illness:    Mary Farrell is a 74 y.o. female with a history of rheumatic mitral stenosis s/p MVR in 11/2018,  moderate aortic regurgitation, chronic pericardial effusion, paroxysmal atrial fibrillation/ flutter s/p ablation in 05/2017 and MAZE procedure in 11/2018, obstructive sleep apnea on CPAP, hypertension, and GERD who is followed by Dr. Oval Farrell and presents today for follow-up.   Patient previously followed by Dr. Mare Farrell. Remote cardiac catheterization in 12/1994 showed normal coronaries with mild MS and mild pulmonary hypertension. Cardiolite in 2007 negative for ischemia. He was seen by Mary Merle, NP, in 04/2015 for worsening shortness of breath at which time an Echo was ordered and showed LVEF of 55-60% with moderate AI and mild to moderate MS. There was also a moderate pericardial effusion but no evidence of tamponade. When seen in clinic in 07/2015, she reported persistent shortness of breath with minimal activity and chest discomfort. Exercise Myoview as ordered and showed no perfusion defects. Patient was admitted in 08/2015 with atrial flutter. Echo at that time showed persistent moderate to severe pericardial effusion but no tamponade. She was started on Amiodarone but was unable to tolerate this due to nausea. She has had multiple cardioversions in the past. She underwent atrial fibrillation/flutter ablation in 02/2017 with repeat ablation in 05/2017. She is now s/p mitral valve replacement in 11/2018 with a 79mm Mary Farrell bioprosthetic valve. She had a MAZE procedure performed at the same time. She has had multiple atrial arrhythmias since then  and has been loaded on Tikosyn. Patient seen by Dr. Curt Farrell in 02/2019 and was doing relatively well at that time. She was in a junctional rhythm at that visit and did note some dizziness at times. Therefore, Cardizem was discontinued. She was last seen by Dr. Oval Farrell in 03/2019 and was doing well at that time. She did not think she had had any recurrence of atrial fibrillation. Coumadin was switched to Eliquis per patient's request and Losartan was increased for further BP control.   Patient called our office on 05/26/2019 after hours as she felt like she was back in atrial fibrillation. She reported palpitations and dyspnea. She has a Kardia device and it showed possible atrial fibrillation. She was instructed to take a dose of Cardizem 120mg  and continue this until follow-up if it helps.   Patient presents today for follow-up. She denies any recurrence of atrial fibrillation since calling our office. She states she took the Cardizem as instructed and thinks she converted back to sinus rhythm about 1 hour later. However, she did not continue to take the Cardizem. She denies any recurrent palpitations. No lightheadedness, dizziness, or syncope. She does intermittent dull chest pain almost on a daily basis. Occurs at rest and not with exertion. It is reproducible with palpation of chest wall. She also notes very brief intermittent of shortness of breath. She states she could just be sitting there and will feel short of breath for a few seconds but it does not last long. She sleep on a wedge due to reflux but this is not new. No PND. She is compliant with her CPAP. She denies any significant lower extremity edema  but states her left leg and left arm are a Mary bigger than her right - she states this is not new and is stable. She takes Lasix as needed. She states she rarely needs but did take a dose last night because she at a lot of graduation birthday cake over the weekend. She weighs herself daily and her weight  has been stable.    Past Medical History:  Diagnosis Date  . Anxiety 06/29/2012  . Aortic regurgitation 08/13/2015  . Arthritis    "hands, wrists, back, feet, toes" (06/24/2017)  . Atrial flutter (Mary Farrell) 09/18/2015  . Atypical chest pain 05/13/2016  . Chest pain    remote cath in 1996 with NORMAL coronaries noted  . CHF (congestive heart failure) ([place])   . Dyspnea 10/30/2010  . Essential hypertension 09/05/2013  . Exogenous obesity    history of   . GERD (gastroesophageal reflux disease)   . Glaucoma, both eyes   . Headache, migraine    "stopped in my 50's" (09/18/2015)  . History of cardiovascular stress test 2007   showing no ischemia  . Hypertension   . Mitral stenosis and aortic insufficiency 06/23/2010  . Mitral stenosis with insufficiency   . Mitral stenosis with regurgitation   . Murmur    of mitral stenosis and moderate aortic insufficiency      . Nausea 05/01/2018  . OSA on CPAP   . Palpitations    occasional  . Paroxysmal A-fib (Mary Farrell) 06/24/2017  . Pericardial effusion 09/18/2015  . Persistent atrial fibrillation (Mary Farrell) 03/10/2017  . Personal history of rheumatic heart disease   . S/P minimally-invasive maze operation for atrial fibrillation 12/13/2018   Complete bilateral atrial lesion set using cryothermy and bipolar radiofrequency ablation with clipping of LA appendage via right mini-thoracotomy approach  . S/P minimally-invasive mitral valve replacement with bioprosthetic valve 12/13/2018   31 mm [facility] Mitral stented bovine pericardial tissue valve  . SBE (subacute bacterial endocarditis)    prophylaxis, patient unaware  . Sliding hiatal hernia   . Vertigo 05/01/2018    Past Surgical History:  Procedure Laterality Date  . ABDOMINAL HYSTERECTOMY  1975  . APPENDECTOMY  1977  . ATRIAL FIBRILLATION ABLATION N/A 03/10/2017   Procedure: ATRIAL FIBRILLATION ABLATION;  Surgeon: Mary Haw, MD;  Location: [place] [facility];  Service: Cardiovascular;  Laterality:  N/A;  . ATRIAL FIBRILLATION ABLATION  06/24/2017  . ATRIAL FIBRILLATION ABLATION N/A 06/24/2017   Procedure: ATRIAL FIBRILLATION ABLATION;  Surgeon: Mary Haw, MD;  Location: [place] [facility];  Service: Cardiovascular;  Laterality: N/A;  . BUNIONECTOMY Right 2000s  . CARDIAC CATHETERIZATION  01/13/1995   normal coronary anatomy and mild mitral stenosis and mild pulmonary hypertension  . CARDIOVERSION N/A 09/22/2015   Procedure: CARDIOVERSION;  Surgeon: Jerline Pain, MD;  Location: [place];  Service: Cardiovascular;  Laterality: N/A;  . CARDIOVERSION N/A 10/25/2016   Procedure: CARDIOVERSION;  Surgeon: Fay Records, MD;  Location: [facility] ENDOSCOPY;  Service: Cardiovascular;  Laterality: N/A;  . CARDIOVERSION N/A 04/15/2017   Procedure: CARDIOVERSION;  Surgeon: Josue Hector, MD;  Location: [facility] ENDOSCOPY;  Service: Cardiovascular;  Laterality: N/A;  . CARDIOVERSION N/A 05/16/2017   Procedure: CARDIOVERSION;  Surgeon: Pixie Casino, MD;  Location: [facility] ENDOSCOPY;  Service: Cardiovascular;  Laterality: N/A;  . CARDIOVERSION N/A 01/24/2019   Procedure: CARDIOVERSION;  Surgeon: Pixie Casino, MD;  Location: [place];  Service: Cardiovascular;  Laterality: N/A;  . CATARACT EXTRACTION W/ INTRAOCULAR LENS  IMPLANT,  BILATERAL Bilateral 2013  . COLONOSCOPY  2013  . EYE SURGERY Bilateral    cataracts  . LAPAROSCOPIC CHOLECYSTECTOMY  1997  . MINIMALLY INVASIVE MAZE PROCEDURE N/A 12/13/2018   Procedure: MINIMALLY INVASIVE MAZE PROCEDURE using a 45 MM AtriClip.;  Surgeon: Rexene Alberts, MD;  Location: [place];  Service: Open Heart Surgery;  Laterality: N/A;  . MITRAL VALVE REPLACEMENT Right 12/13/2018   Procedure: MINIMALLY INVASIVE MITRAL VALVE (MV) REPLACEMENT using a Magna Mitral Ease 31 MM Valve.;  Surgeon: Rexene Alberts, MD;  Location: [place];  Service: Open Heart Surgery;  Laterality: Right;  . RIGHT/LEFT HEART CATH AND CORONARY ANGIOGRAPHY N/A 10/18/2018   Procedure:  RIGHT/LEFT HEART CATH AND CORONARY ANGIOGRAPHY;  Surgeon: Leonie Man, MD;  Location: [place] [facility];  Service: Cardiovascular;  Laterality: N/A;  . TEE WITHOUT CARDIOVERSION N/A 06/23/2017   Procedure: TRANSESOPHAGEAL ECHOCARDIOGRAM (TEE);  Surgeon: Fay Records, MD;  Location: [place];  Service: Cardiovascular;  Laterality: N/A;  . TEE WITHOUT CARDIOVERSION N/A 10/17/2018   Procedure: TRANSESOPHAGEAL ECHOCARDIOGRAM (TEE);  Surgeon: Jerline Pain, MD;  Location: [facility] ENDOSCOPY;  Service: Cardiovascular;  Laterality: N/A;  . TEE WITHOUT CARDIOVERSION N/A 12/13/2018   Procedure: TRANSESOPHAGEAL ECHOCARDIOGRAM (TEE);  Surgeon: Rexene Alberts, MD;  Location: [place];  Service: Open Heart Surgery;  Laterality: N/A;  . TONSILLECTOMY AND ADENOIDECTOMY  1990s  . UVULOPALATOPHARYNGOPLASTY, TONSILLECTOMY AND SEPTOPLASTY  1990s    Current Medications: Current Meds  Medication Sig  . acetaminophen (TYLENOL) 500 MG tablet Take 1,000 mg by mouth every 6 (six) hours as needed for moderate pain or headache.   . ALPRAZolam (XANAX) 0.25 MG tablet Take 0.125 mg by mouth 2 (two) times daily as needed for anxiety.   Marland Kitchen apixaban (ELIQUIS) 5 MG TABS tablet Take 1 tablet (5 mg total) by mouth 2 (two) times daily.  . carvedilol (COREG) 12.5 MG tablet Take 1 tablet (12.5 mg total) by mouth 2 (two) times daily with a meal. NEED OFFICE VISIT FOR FUTURE REFILL  . cetirizine (ZYRTEC) 10 MG tablet Take 10 mg by mouth at bedtime.   . Cholecalciferol (VITAMIN D) 2000 UNITS CAPS Take 2,000 Units by mouth daily.    Marland Kitchen conjugated estrogens (PREMARIN) vaginal cream Place 1 Applicatorful vaginally daily as needed (irritation).   . dofetilide (TIKOSYN) 500 MCG capsule TAKE 1 CAPSULE (500 MCG TOTAL) BY MOUTH 2 (TWO) TIMES DAILY.  Marland Kitchen dorzolamide-timolol (COSOPT) 22.3-6.8 MG/ML ophthalmic solution Place 1 drop into both eyes daily.  . famotidine (PEPCID) 20 MG tablet Take 20 mg by mouth at bedtime.  . furosemide (LASIX) 40  MG tablet Take 1 tablet (40 mg total) by mouth daily as needed (for worsening swelling or weight gain of 3lbs or more).  . gabapentin (NEURONTIN) 300 MG capsule Take 300 mg by mouth 3 (three) times daily.   Marland Kitchen lactobacillus acidophilus (BACID) TABS tablet Take 1 tablet by mouth daily.   Marland Kitchen latanoprost (XALATAN) 0.005 % ophthalmic solution Place 1 drop into both eyes at bedtime.   Marland Kitchen losartan (COZAAR) 100 MG tablet Take 1 tablet (100 mg total) by mouth daily.  . Multiple Vitamin (MULTIVITAMIN WITH MINERALS) TABS tablet Take 1 tablet by mouth daily.  . NON FORMULARY Apply 1 application topically 2 (two) times daily as needed (for eczema). Traimcinolone/[employer] Moist Cream  . omeprazole (PRILOSEC) 40 MG capsule Take 40 mg by mouth daily.   Marland Kitchen oxybutynin (DITROPAN-XL) 10 MG 24 hr tablet Take 10 mg by mouth daily.   Marland Kitchen  polyethylene glycol (MIRALAX / GLYCOLAX) 17 g packet Take 17 g by mouth daily as needed for moderate constipation.   . potassium chloride (KLOR-CON) 10 MEQ tablet Take 1 tablet (10 mEq total) by mouth daily as needed (if you need to take Lasix).  Marland Kitchen PRESCRIPTION MEDICATION Inhale into the lungs at bedtime. CPAP     Allergies:   Amoxicillin, Metoprolol tartrate, and Oxaprozin   Social History   Socioeconomic History  . Marital status: Married    Spouse name: Not on file  . Number of children: Not on file  . Years of education: Not on file  . Highest education level: Not on file  Occupational History  . Occupation: Retired  Tobacco Use  . Smoking status: Never Smoker  . Smokeless tobacco: Never Used  Substance and Sexual Activity  . Alcohol use: Yes    Comment: rarely  . Drug use: No  . Sexual activity: Not Currently  Other Topics Concern  . Not on file  Social History Narrative   Lives with husband in [place].   Social Determinants of Farrell   Financial Resource Strain:   . Difficulty of Paying Living Expenses:   Food Insecurity:   . Worried About Charity fundraiser in  the Last Year:   . Arboriculturist in the Last Year:   Transportation Needs:   . Film/video editor (Medical):   Marland Kitchen Lack of Transportation (Non-Medical):   Physical Activity:   . Days of Exercise per Week:   . Minutes of Exercise per Session:   Stress:   . Feeling of Stress :   Social Connections:   . Frequency of Communication with Friends and Family:   . Frequency of Social Gatherings with Friends and Family:   . Attends Religious Services:   . Active Member of Clubs or Organizations:   . Attends Archivist Meetings:   Marland Kitchen Marital Status:      Family History: The patient's family history includes Diabetes in her brother; Heart attack in her mother; Heart failure in her maternal grandfather; Hypertension in her brother; Kidney disease in her brother.  ROS:   Please see the history of present illness.    All other systems reviewed and are negative.  EKGs/Labs/Other Studies Reviewed:    The following studies were reviewed today:  Right/Left Heart Catheterization 10/18/2018:  Hemodynamic findings consistent with mild pulmonary hypertension.  LV end diastolic pressure is normal.  There is severe mitral valve stenosis -suggested by a echocardiogram, large PCWP V waves along with elevated PCWP but normal LVEDP would also suggest this.  Angiographically normal coronary arteries   Summary:  Angiographically normal coronary arteries  Mild pulmonary hypertension with mildly elevated PCWP but normal LVEDP suggestive of mitral stenosis  Significant V wave noted on PCWP waveform suggesting mitral stenosis.  Recommendations:  Return to nursing unit for ongoing care.    Proceed with plans for mitral valve replacement _______________  Zio Monitor 11/16/2018 to 11/29/2018: Max 148 bpm 05:47pm, 10/23 Min 25 bpm 08:55am, 10/28 Avg 62 bpm Rare PACs and PVCs 3% atrial fibrillation/flutter burden 7 pauses, longest 4.3 seconds Symptoms associated with sinus rhythm,  atrial flutter, and sinus bradycardia with pauses. _______________  Echocardiogram 01/23/2019: Impressions: 1. Left ventricular ejection fraction, by visual estimation, is 55 to  60%. The left ventricle has normal function. Left ventricular septal wall  thickness was mildly increased. Moderately increased left ventricular  posterior wall thickness. There is  mildly increased left ventricular hypertrophy.  2. Left ventricular diastolic parameters are indeterminate.  3. The left ventricle demonstrates regional wall motion abnormalities.  4. Elevated LVEDP. Hypokinesis of the basal septum consistent with  post-operative state.  5. Global right ventricle has normal systolic function.The right  ventricular size is normal. No increase in right ventricular wall  thickness.  6. Left atrial size was severely dilated.  7. Right atrial size was normal.  8. The mitral valve has been repaired/replaced. No evidence of mitral  valve regurgitation. No evidence of mitral stenosis.  9. The tricuspid valve is normal in structure.  10. The aortic valve is tricuspid. Aortic valve regurgitation is mild. No  evidence of aortic valve sclerosis or stenosis.  11. The pulmonic valve was normal in structure. Pulmonic valve  regurgitation is not visualized.  12. Normal pulmonary artery systolic pressure.  13. The inferior vena cava is normal in size with <50% respiratory  variability, suggesting right atrial pressure of 8 mmHg.   In comparison to the previous echocardiogram(s): Bioprosthetic mitral  valve now present and functioning properly. Mean gradient 5 mmHg.  EKG:  EKG ordered today. EKG personally reviewed and demonstrates normal sinus rhythm, rate 61 bpm, with borderline 1st degree AV and very mild T wave inversion in lead V2. Normal QRS. Normal axis. QTc 465 ms.  Recent Labs: 01/22/2019: Hemoglobin 12.0; Platelets 355 01/28/2019: ALT 13 02/05/2019: Magnesium 2.1 06/08/2019: BUN 11;  Creatinine, Ser 0.78; Potassium 4.0; Sodium 140  Recent Lipid Panel No results found for: CHOL, TRIG, HDL, CHOLHDL, VLDL, LDLCALC, LDLDIRECT  Physical Exam:    Vital Signs: BP (!) 136/58   Pulse 61   Ht 5' (1.524 m)   Wt 154 lb 12.8 oz (70.2 kg)   BMI 30.23 kg/m     Wt Readings from Last 3 Encounters:  06/19/19 154 lb 12.8 oz (70.2 kg)  05/25/19 155 lb 6.8 oz (70.5 kg)  04/19/19 155 lb (70.3 kg)     General: 74 y.o. female in no acute distress. HEENT: Normocephalic and atraumatic. Sclera clear. EOMs intact. Neck: Supple. No carotid bruits. No JVD. Heart: RRR. Distinct S1 and S2. No significant murmurs, gallops, or rubs noted. Radial and distal pedal pulses 2+ and equal bilaterally. Lungs: No increased work of breathing. Clear to ausculation bilaterally. No wheezes, rhonchi, or rales.  Abdomen: Soft, non-distended, and non-tender to palpation. Extremities: Mild non-pitting edema of left upper and lower extremity edema (chronic).  Skin: Warm and dry. Neuro: Alert and oriented x3. No focal deficits. Psych: Normal affect. Responds appropriately.  Assessment:    1. Persistent atrial fibrillation/flutter   2. Mitral valve stenosis, unspecified etiology   3. Atypical chest pain   4. Essential hypertension   5. Hyperlipidemia, unspecified hyperlipidemia type   6. Medication management     Plan:    Persistent Atrial Fibrillation/Flutter - Maintaining sinus rhythm on Tikosyn. Sounds like patient had an episode of atrial fibrillation a couple of weeks ago but converted back to sinus rhythm after taking dose of Cardizem 120mg  daily. She was instructed to take this daily but only took the one dose. Given no recurrence, will hold off on restarting Cardizem for now (patient does not want to be on any more daily medications if possible). - Continue Coreg 12.5mg  twice daily.  - Continue Tikosyn 500mg  twice daily.   - Continue chronic anticoagulation with Eliquis 5mg  twice daily.  -  Potassium 4.0 on 06/08/2019. Will check magnesium today.  Mitral Stenosis s/p MVR - History of minimally invasive bioprosthetic mitral  valve replacement in 11/2018.  - Echo in 12/2018 showed normal LVEF with no evidence of MR or MS. Mean gradient 5 mmHg.  Chronic Pericardial Effusion - History of chronic pericardial effusion. However, no mention of pericardial effusion on most recent Echo in 12/2018. - No signs of tamponade on exam.  Atypical Chest Pain - Patient report atypical chest pain while at rest. Non-exertional. Reproducible with palpation of chest wall. Likely musculoskeletal in nature. - Reassuring that patient had normal coronaries on cath in 09/2018. - Patient to notify us if she develops worsening or exertional chest pain.  Hypertension - BP mildly elevated at 135/58 in the office today but was 120/51 at home this morning. Patient keeps a detailed BP/HR log at home and BP has been mostly at goal of <130/80. - Continue current medications: Losartan 100mg  daily and Coreg 12.5mg  twice daily. Also take PRN Lasix for weight gain and edema.  Hyperlipidemia - Most recent lipid panel from 06/2018 ([facility]): Total Cholesterol 140, Triglycerides 145, HDL 45, LDL 66. - ASCVD 10 year risk is 15%. At last visit with Dr. Oval Farrell, patient wanted to focus on diet and exercise before starting a statin. Plan was to recheck lipids in 07/2019 at PCP's office and then re-evaluate. Patient still would prefer not be on any other medications. Asked her to have labs faxed to [place] once drawn.   Disposition: Follow up in 3-4 months.  Medication Adjustments/Labs and Tests Ordered: Current medicines are reviewed at length with the patient today.  Concerns regarding medicines are outlined above.  Orders Placed This Encounter  Procedures  . Magnesium  . EKG 12-Lead   No orders of the defined types were placed in this encounter.   Patient Instructions  Medication Instructions:  Your physician  recommends that you continue on your current medications as directed. Please refer to the Current Medication list given to you today.  *If you need a refill on your cardiac medications before your next appointment, please call your pharmacy*  Lab Work: Your physician recommends that you return for lab work TODAY:   Magnesium  If you have labs (blood work) drawn today and your tests are completely normal, you will receive your results only by: Marland Kitchen MyChart Message (if you have MyChart) OR . A paper copy in the mail If you have any lab test that is abnormal or we need to change your treatment, we will call you to review the results.  Testing/Procedures: NONE ordered at this time of appointment   Follow-Up: At [facility], you and your Farrell needs are our priority.  As part of our continuing mission to provide you with exceptional heart care, we have created designated Provider Care Teams.  These Care Teams include your primary Cardiologist (physician) and Advanced Practice Providers (APPs -  Physician Assistants and Nurse Practitioners) who all work together to provide you with the care you need, when you need it.  Your next appointment:   3-4 month(s)  The format for your next appointment:   In Person  Provider:   Skeet Latch, MD  Other Instructions      Signed, Darreld Mclean, PA-C  06/19/2019 2:42 PM    [place]

## 2019-06-19 ENCOUNTER — Ambulatory Visit (INDEPENDENT_AMBULATORY_CARE_PROVIDER_SITE_OTHER): Payer: Medicare Other | Admitting: Student

## 2019-06-19 ENCOUNTER — Encounter: Payer: Self-pay | Admitting: Student

## 2019-06-19 ENCOUNTER — Other Ambulatory Visit: Payer: Self-pay

## 2019-06-19 VITALS — BP 136/58 | HR 61 | Ht 60.0 in | Wt 154.8 lb

## 2019-06-19 DIAGNOSIS — I10 Essential (primary) hypertension: Secondary | ICD-10-CM

## 2019-06-19 DIAGNOSIS — I4819 Other persistent atrial fibrillation: Secondary | ICD-10-CM

## 2019-06-19 DIAGNOSIS — E785 Hyperlipidemia, unspecified: Secondary | ICD-10-CM

## 2019-06-19 DIAGNOSIS — R0789 Other chest pain: Secondary | ICD-10-CM | POA: Diagnosis not present

## 2019-06-19 DIAGNOSIS — Z79899 Other long term (current) drug therapy: Secondary | ICD-10-CM

## 2019-06-19 DIAGNOSIS — I050 Rheumatic mitral stenosis: Secondary | ICD-10-CM | POA: Diagnosis not present

## 2019-06-19 NOTE — Patient Instructions (Addendum)
Medication Instructions:  Your physician recommends that you continue on your current medications as directed. Please refer to the Current Medication list given to you today.  *If you need a refill on your cardiac medications before your next appointment, please call your pharmacy*  Lab Work: Your physician recommends that you return for lab work TODAY:   Magnesium  If you have labs (blood work) drawn today and your tests are completely normal, you will receive your results only by: Marland Kitchen MyChart Message (if you have MyChart) OR . A paper copy in the mail If you have any lab test that is abnormal or we need to change your treatment, we will call you to review the results.  Testing/Procedures: NONE ordered at this time of appointment   Follow-Up: At [facility], you and your health needs are our priority.  As part of our continuing mission to provide you with exceptional heart care, we have created designated Provider Care Teams.  These Care Teams include your primary Cardiologist (physician) and Advanced Practice Providers (APPs -  Physician Assistants and Nurse Practitioners) who all work together to provide you with the care you need, when you need it.  Your next appointment:   3-4 month(s)  The format for your next appointment:   In Person  Provider:   Skeet Latch, MD  Other Instructions

## 2019-06-20 LAB — MAGNESIUM: Magnesium: 2.3 mg/dL (ref 1.6–2.3)

## 2019-08-26 ENCOUNTER — Other Ambulatory Visit: Payer: Self-pay | Admitting: Cardiovascular Disease

## 2019-09-11 ENCOUNTER — Other Ambulatory Visit: Payer: Self-pay

## 2019-09-11 ENCOUNTER — Ambulatory Visit (INDEPENDENT_AMBULATORY_CARE_PROVIDER_SITE_OTHER): Payer: Medicare Other | Admitting: Cardiovascular Disease

## 2019-09-11 ENCOUNTER — Encounter: Payer: Self-pay | Admitting: Cardiovascular Disease

## 2019-09-11 VITALS — BP 130/74 | HR 72 | Temp 97.7°F | Ht 60.0 in | Wt 156.0 lb

## 2019-09-11 DIAGNOSIS — I10 Essential (primary) hypertension: Secondary | ICD-10-CM

## 2019-09-11 DIAGNOSIS — Z953 Presence of xenogenic heart valve: Secondary | ICD-10-CM | POA: Diagnosis not present

## 2019-09-11 DIAGNOSIS — I4819 Other persistent atrial fibrillation: Secondary | ICD-10-CM | POA: Diagnosis not present

## 2019-09-11 DIAGNOSIS — R0789 Other chest pain: Secondary | ICD-10-CM | POA: Diagnosis not present

## 2019-09-11 MED ORDER — VALSARTAN 320 MG PO TABS: 320.0000 mg | ORAL_TABLET | Freq: Every day | ORAL | 1 refills | Status: DC

## 2019-09-11 NOTE — Progress Notes (Signed)
Cardiology Office Note  Date:  09/11/2019   ID:  [name], DOB [birth year], MRN [id number]  PCP:  Mary Cruel, MD  Cardiologist:   Mary Latch, MD  Electrophysiologist: Dr. Curt Farrell  No chief complaint on file.   History of Present Illness: Mary Farrell is a 74 y.o. female with moderate aortic regurgitation, Rheumatic mitral stenosis s/p MVR, chronic pericardial effusion, paroxysmal atrial flutter s/p ablation 05/2017, MAZE 11/2018 and hypertension who presents for follow up.  Mary Farrell was previously a patient of Mary Farrell.  She followed up with Mary Farrell on 04/2015 due to shortness of breath that she felt was getting worse.  She was referred for an echo 04/2015 that revealed LVEF 55-60% with moderate AR and mild-moderate MS.  There was also a moderate pericardial effusion but no evidence of tamponade.  She had a heart cath 12/1994 that revealed normal coronaries with mild MS and mild pulmonary hypertension.  She also had a Cardiolite in 2007 that was negative for ischemia.  She was seen in clinic 07/2015 and reported persistent shortness of breath with minimal exertion and chest discomfort.  She was referred for exercise Myoview 08/26/15 that revealed LVEF 60% and no perfusion defects.    Mary Farrell was admitted 08/2015 with atrial flutter.  She had an echo that showed a persistent moderate-severe pericardial effusion but no tamponade.  She was started on amiodarone, which she did not tolerate due to nausea.  She underwent DCCV on 09/22/15 and then was started on flecainide.  She continued to report shortness of breath and was again referred for an echo that revealed LVEF 60-65% with grade 2 diastolic dysfunction, mild aortic regurgitation, and moderate mitral stenosis. She had a moderate pericardial effusion localized to the inferior and inferolateral walls. There was no evidence of tamponade.  Mary Farrell had an outpatient DCCV on 10/1 and converted with one shock at Mary Farrell.  She  developed recurrent atrial fibrillation and was referred to EP.  She underwent afib/flutter ablation with Dr. Curt Farrell on 06/24/17. She was started on flecainide but had recurent disease and was started on Tikosyn.  Her mitral valve was replaced 11/2018 (31 mm [facility] bioprosthetic).  She had a MAZE at that time.  She followed up with Mary Farrell 1/13 and was doing well. She later said Mary Farrell and was in a junctional rhythm so diltiazem was discontinued.  She had recurrent episode of atrial fibrillation and took a dose of diltiazem she subsequently converted back to sinus rhythm.  She last follow-up with her clinic on 05/2019 and her blood pressure was a little above goal but it was well have been controlled at home so no changes were made.  She had some atypical chest pain and was thought to be musculoskeletal.  She was doing well until last week.  She wasn't feeling well and was walking into the kitchen and had difficulty focusing her eyes.  She has intermittent twinges of discomfort in her left chest.  Last night she was sitting down and had severe jaw discomfort.  She hasn't had any exertional symptoms.  She has been doing a stepper machine and has no exertional symptoms.  She continues to have random episodes of shortness of breath.  She hasn't had any episodes of atrial fibrillation.  She brings a log of her BP that shows it has been ranging from the 110s-140s/50-60s.  Her heart rate has been in the 50-70s.  She notes that since she switched from  warfarin to Mary Farrell her hair is no longer falling out.  She has been under a lot of family stress with her son who was recently diagnosed with bipolar disorder.     Past Medical History:  Diagnosis Date  . Anxiety 06/29/2012  . Aortic regurgitation 08/13/2015  . Arthritis    "hands, wrists, back, feet, toes" (06/24/2017)  . Atrial flutter ([place]) 09/18/2015  . Atypical chest pain 05/13/2016  . Chest pain    remote cath in 1996 with NORMAL coronaries noted  .  CHF (congestive heart failure) ([place])   . Dyspnea 10/30/2010  . Essential hypertension 09/05/2013  . Exogenous obesity    history of   . GERD (gastroesophageal reflux disease)   . Glaucoma, both eyes   . Headache, migraine    "stopped in my 50's" (09/18/2015)  . History of cardiovascular stress test 2007   showing no ischemia  . Hypertension   . Mitral stenosis and aortic insufficiency 06/23/2010  . Mitral stenosis with insufficiency   . Mitral stenosis with regurgitation   . Murmur    of mitral stenosis and moderate aortic insufficiency      . Nausea 05/01/2018  . OSA on CPAP   . Palpitations    occasional  . Paroxysmal A-fib ([place]) 06/24/2017  . Pericardial effusion 09/18/2015  . Persistent atrial fibrillation ([place]) 03/10/2017  . Personal history of rheumatic heart disease   . S/P minimally-invasive maze operation for atrial fibrillation 12/13/2018   Complete bilateral atrial lesion set using cryothermy and bipolar radiofrequency ablation with clipping of LA appendage via right mini-thoracotomy approach  . S/P minimally-invasive mitral valve replacement with bioprosthetic valve 12/13/2018   31 mm [facility] stented bovine pericardial tissue valve  . SBE (subacute bacterial endocarditis)    prophylaxis, patient unaware  . Sliding hiatal hernia   . Vertigo 05/01/2018    Past Surgical History:  Procedure Laterality Date  . ABDOMINAL HYSTERECTOMY  1975  . APPENDECTOMY  1977  . ATRIAL FIBRILLATION ABLATION N/A 03/10/2017   Procedure: ATRIAL FIBRILLATION ABLATION;  Surgeon: [name], MD;  Location: [place] [facility];  Service: Cardiovascular;  Laterality: N/A;  . ATRIAL FIBRILLATION ABLATION  06/24/2017  . ATRIAL FIBRILLATION ABLATION N/A 06/24/2017   Procedure: ATRIAL FIBRILLATION ABLATION;  Surgeon: [name], MD;  Location: [place] [facility];  Service: Cardiovascular;  Laterality: N/A;  . BUNIONECTOMY Right 2000s  . CARDIAC CATHETERIZATION  01/13/1995    normal coronary anatomy and mild mitral stenosis and mild pulmonary hypertension  . CARDIOVERSION N/A 09/22/2015   Procedure: CARDIOVERSION;  Surgeon: Jerline Pain, MD;  Location: [place];  Service: Cardiovascular;  Laterality: N/A;  . CARDIOVERSION N/A 10/25/2016   Procedure: CARDIOVERSION;  Surgeon: Fay Records, MD;  Location: [place];  Service: Cardiovascular;  Laterality: N/A;  . CARDIOVERSION N/A 04/15/2017   Procedure: CARDIOVERSION;  Surgeon: Josue Hector, MD;  Location: [facility] ENDOSCOPY;  Service: Cardiovascular;  Laterality: N/A;  . CARDIOVERSION N/A 05/16/2017   Procedure: CARDIOVERSION;  Surgeon: Pixie Casino, MD;  Location: [facility];  Service: Cardiovascular;  Laterality: N/A;  . CARDIOVERSION N/A 01/24/2019   Procedure: CARDIOVERSION;  Surgeon: Pixie Casino, MD;  Location: [facility] ENDOSCOPY;  Service: Cardiovascular;  Laterality: N/A;  . CATARACT EXTRACTION W/ INTRAOCULAR LENS  IMPLANT, BILATERAL Bilateral 2013  . COLONOSCOPY  2013  . EYE SURGERY Bilateral    cataracts  . LAPAROSCOPIC CHOLECYSTECTOMY  1997  . MINIMALLY INVASIVE MAZE PROCEDURE N/A 12/13/2018  Procedure: MINIMALLY INVASIVE MAZE PROCEDURE using a 45 MM AtriClip.;  Surgeon: Rexene Alberts, MD;  Location: [place];  Service: Open Heart Surgery;  Laterality: N/A;  . MITRAL VALVE REPLACEMENT Right 12/13/2018   Procedure: MINIMALLY INVASIVE MITRAL VALVE (MV) REPLACEMENT using a Magna Mitral Ease 31 MM Valve.;  Surgeon: Rexene Alberts, MD;  Location: [place];  Service: Open Heart Surgery;  Laterality: Right;  . RIGHT/LEFT HEART CATH AND CORONARY ANGIOGRAPHY N/A 10/18/2018   Procedure: RIGHT/LEFT HEART CATH AND CORONARY ANGIOGRAPHY;  Surgeon: Leonie Man, MD;  Location: [place] [facility];  Service: Cardiovascular;  Laterality: N/A;  . TEE WITHOUT CARDIOVERSION N/A 06/23/2017   Procedure: TRANSESOPHAGEAL ECHOCARDIOGRAM (TEE);  Surgeon: Fay Records, MD;  Location: [place];  Service:  Cardiovascular;  Laterality: N/A;  . TEE WITHOUT CARDIOVERSION N/A 10/17/2018   Procedure: TRANSESOPHAGEAL ECHOCARDIOGRAM (TEE);  Surgeon: Jerline Pain, MD;  Location: [facility] ENDOSCOPY;  Service: Cardiovascular;  Laterality: N/A;  . TEE WITHOUT CARDIOVERSION N/A 12/13/2018   Procedure: TRANSESOPHAGEAL ECHOCARDIOGRAM (TEE);  Surgeon: Rexene Alberts, MD;  Location: [place];  Service: Open Heart Surgery;  Laterality: N/A;  . TONSILLECTOMY AND ADENOIDECTOMY  1990s  . UVULOPALATOPHARYNGOPLASTY, TONSILLECTOMY AND SEPTOPLASTY  1990s     Current Outpatient Medications  Medication Sig Dispense Refill  . acetaminophen (TYLENOL) 500 MG tablet Take 1,000 mg by mouth every 6 (six) hours as needed for moderate pain or headache.     . ALPRAZolam (XANAX) 0.25 MG tablet Take 0.125 mg by mouth 2 (two) times daily as needed for anxiety.     Marland Kitchen apixaban (Mary Farrell) 5 MG TABS tablet Take 1 tablet (5 mg total) by mouth 2 (two) times daily. 180 tablet 1  . carvedilol (COREG) 12.5 MG tablet TAKE 1 TABLET BY MOUTH 2 (TWO) TIMES DAILY WITH A MEAL. NEED OFFICE VISIT FOR FUTURE REFILL 180 tablet 3  . cetirizine (ZYRTEC) 10 MG tablet Take 10 mg by mouth at bedtime.     . Cholecalciferol (VITAMIN D) 2000 UNITS CAPS Take 2,000 Units by mouth daily.      Marland Kitchen conjugated estrogens (PREMARIN) vaginal cream Place 1 Applicatorful vaginally daily as needed (irritation).     . dofetilide (TIKOSYN) 500 MCG capsule TAKE 1 CAPSULE (500 MCG TOTAL) BY MOUTH 2 (TWO) TIMES DAILY. 180 capsule 3  . dorzolamide-timolol (COSOPT) 22.3-6.8 MG/ML ophthalmic solution Place 1 drop into both eyes daily.    . famotidine (PEPCID) 20 MG tablet Take 20 mg by mouth at bedtime.    . fluticasone (FLONASE) 50 MCG/ACT nasal spray Place 1 spray into both nostrils daily.    . furosemide (LASIX) 40 MG tablet Take 1 tablet (40 mg total) by mouth daily as needed (for worsening swelling or weight gain of 3lbs or more). 30 tablet 1  . gabapentin (NEURONTIN) 300 MG  capsule Take 300 mg by mouth 3 (three) times daily.     Marland Kitchen lactobacillus acidophilus (BACID) TABS tablet Take 1 tablet by mouth daily.     Marland Kitchen latanoprost (XALATAN) 0.005 % ophthalmic solution Place 1 drop into both eyes at bedtime.   6  . Multiple Vitamin (MULTIVITAMIN WITH MINERALS) TABS tablet Take 1 tablet by mouth daily.    . NON FORMULARY Apply 1 application topically 2 (two) times daily as needed (for eczema). Traimcinolone/[employer] Moist Cream    . omeprazole (PRILOSEC) 40 MG capsule Take 40 mg by mouth daily.     Marland Kitchen oxybutynin (DITROPAN-XL) 10 MG 24 hr tablet Take 10  mg by mouth daily.     . polyethylene glycol (MIRALAX / GLYCOLAX) 17 g packet Take 17 g by mouth daily as needed for moderate constipation.     . potassium chloride (KLOR-CON) 10 MEQ tablet Take 1 tablet (10 mEq total) by mouth daily as needed (if you need to take Lasix). 30 tablet 2  . PRESCRIPTION MEDICATION Inhale into the lungs at bedtime. CPAP    . valsartan (DIOVAN) 320 MG tablet Take 1 tablet (320 mg total) by mouth daily. 90 tablet 1   No current facility-administered medications for this visit.    Allergies:   Amoxicillin, Metoprolol tartrate, and Oxaprozin    Social History:  The patient  reports that she has never smoked. She has never used smokeless tobacco. She reports current alcohol use. She reports that she does not use drugs.   Family History:  The patient's  family history includes Diabetes in her brother; Heart attack in her mother; Heart failure in her maternal grandfather; Hypertension in her brother; Kidney disease in her brother.    ROS:  Please see the history of present illness.   Otherwise, review of systems are positive for none.   All other systems are reviewed and negative.    PHYSICAL EXAM: VS:  BP 130/74   Pulse 72   Temp 97.7 F (36.5 C)   Ht 5' (1.524 m)   Wt 156 lb (70.8 kg)   SpO2 95%   BMI 30.47 kg/m  , BMI Body mass index is 30.47 kg/m. GENERAL:  Well appearing HEENT: Pupils  equal round and reactive, fundi not visualized, oral mucosa unremarkable NECK:  No jugular venous distention, waveform within normal limits, carotid upstroke brisk and symmetric, no bruits LUNGS:  Clear to auscultation bilaterally HEART:  RRR.  PMI not displaced or sustained,S1 and S2 within normal limits, + S3, no S4, no clicks, no rubs, III/VI diastolic murmur at the LUSB.  III/VI holosystolic murmur at the apex.   ABD:  Flat, positive bowel sounds normal in frequency in pitch, no bruits, no rebound, no guarding, no midline pulsatile mass, no hepatomegaly, no splenomegaly EXT:  2 plus pulses throughout, no edema, no cyanosis no clubbing SKIN:  No rashes no nodules NEURO:  Cranial nerves II through XII grossly intact, motor grossly intact throughout PSYCH:  Cognitively intact, oriented to person place and time.Ansious   EKG:  EKG is not ordered today. The ekg ordered 05/07/15 demonstraSinus bradycardia rate 57 bpm. 05/13/16: Sinus rhythm. Rate 66 bpm. 09/09/16: Sinus rhythm. Rate 64 bpm.   10/26/16: Sinus rhythm.  Rate 74 bpm.  Exercise Myoveiw 08/26/15:  The left ventricular ejection fraction is normal (55-65%).  Nuclear stress EF: 60%.  Blood pressure demonstrated a hypertensive response to exercise.  Upsloping ST segment depression ST segment depression of 1 mm was noted during stress in the II, III, V6, V5 and aVF leads.  This is a low risk study.   No reversible ischemia. LVEF 60% with normal wall motion. Fair exercise tolerance. No chest pain. This is a low risk study.  TTE 01/23/19: 1. Left ventricular ejection fraction, by visual estimation, is 55 to  60%. The left ventricle has normal function. Left ventricular septal wall  thickness was mildly increased. Moderately increased left ventricular  posterior wall thickness. There is  mildly increased left ventricular hypertrophy.  2. Left ventricular diastolic parameters are indeterminate.  3. The left ventricle demonstrates  regional wall motion abnormalities.  4. Elevated LVEDP. Hypokinesis of the basal septum  consistent with  post-operative state.  5. Global right ventricle has normal systolic function.The right  ventricular size is normal. No increase in right ventricular wall  thickness.  6. Left atrial size was severely dilated.  7. Right atrial size was normal.  8. The mitral valve has been repaired/replaced. No evidence of mitral  valve regurgitation. No evidence of mitral stenosis.  9. The tricuspid valve is normal in structure.  10. The aortic valve is tricuspid. Aortic valve regurgitation is mild. No  evidence of aortic valve sclerosis or stenosis.  11. The pulmonic valve was normal in structure. Pulmonic valve  regurgitation is not visualized.  12. Normal pulmonary artery systolic pressure.  13. The inferior vena cava is normal in size with <50% respiratory  variability, suggesting right atrial pressure of 8 mmHg.   Recent Labs: 01/22/2019: Hemoglobin 12.0; Platelets 355 01/28/2019: ALT 13 06/08/2019: BUN 11; Creatinine, Ser 0.78; Potassium 4.0; Sodium 140 06/19/2019: Magnesium 2.3   07/05/16: Total cholesterol 167, triglycerides 159, HDL 47, LDL 88 TSH 2.2 Sodium 141, potassium 3.8, BUN 14, creatinine 0.77 AST 13, ALT 14  08/03/2017: Total cholesterol 215, triglycerides 121, HDL 67, LDL 124  07/24/2019:  Sodium 137, potassium 4.4, BUN 14, creatinine 0.72 AST 14, ALT 15 Total cholesterol 168, triglycerides 94, HDL 62, LDL 89 TSH 2.39  Lipid Panel No results found for: CHOL, TRIG, HDL, CHOLHDL, VLDL, LDLCALC, LDLDIRECT    Wt Readings from Last 3 Encounters:  09/11/19 156 lb (70.8 kg)  06/19/19 154 lb 12.8 oz (70.2 kg)  05/25/19 155 lb 6.8 oz (70.5 kg)     ASSESSMENT AND PLAN:  # Persitent atrial flutter/fibrillation:  Now maintaining sinus rhythm on Tikosyn.  Continue carvedilol and Mary Farrell.  # Mitral stenosis s/p bioprosthetic MVR: # Rheumatic heart disease: TEE showed  severe MS.  She underwent successful minimally invasive bioprosthetic MVR with Dr. Roxy Manns 11/2018.  Mean gradient 5 mmHg 12/2018.    # Hypertension: BP is above goal both at home and here.  We will switch from losartan to valsartan.  Continue carvedilol.    # Hyperlipidemia: ASCVD 10 year risk 15%.  She wants to really focus on diet and exercise before starting a statin.  We will recheck in 4 months and reevaluate.  # Atypical chest pain:   Symptoms are extremely atypical and never occur with exertion.  She also had normal coronary arteries on cath prior to surgery.  Therefore nothing this is ischemic and we will not repeat her work-up.  Even if there were surgical complication, her systolic function improved after surgery.  Therefore I think it is extremely unlikely that her coronaries involved.  It seems that this is either musculoskeletal or related to GERD.  She will increase her omeprazole to twice daily for a week.  She will also cut back on her exercise rower for a while.     Current medicines are reviewed at length with the patient today.  The patient does not have concerns regarding medicines.  The following changes have been made:  Switch losartan tot valsartan  Labs/ tests ordered today include:   No orders of the defined types were placed in this encounter.    Disposition:   FU with Shadaya Marschner C. Oval Linsey, MD, [facility] in 2 months     Signed, Taitum Menton C. Oval Linsey, MD, [facility]  09/11/2019 8:28 AM    [facility]

## 2019-09-11 NOTE — Patient Instructions (Signed)
Medication Instructions:  STOP LOSARTAN   START VALSARTAN 320 MG DAILY   INCREASE YOUR OMEPRAZOLE TO TWICE A DAY FOR 1 WEEK   *If you need a refill on your cardiac medications before your next appointment, please call your pharmacy*  Lab Work: NONE   Testing/Procedures: NONE  Follow-Up: At [facility], you and your health needs are our priority.  As part of our continuing mission to provide you with exceptional heart care, we have created designated Provider Care Teams.  These Care Teams include your primary Cardiologist (physician) and Advanced Practice Providers (APPs -  Physician Assistants and Nurse Practitioners) who all work together to provide you with the care you need, when you need it.  We recommend signing up for the patient portal called "MyChart".  Sign up information is provided on this After Visit Summary.  MyChart is used to connect with patients for Virtual Visits (Telemedicine).  Patients are able to view lab/test results, encounter notes, upcoming appointments, etc.  Non-urgent messages can be sent to your provider as well.   To learn more about what you can do with MyChart, go to [email or web address].    Your next appointment:   2 month(s)  The format for your next appointment:   In Person  Provider:   You may see Skeet Latch, MD or one of the following Advanced Practice Providers on your designated Care Team:    Kerin Ransom, PA-C  [place], [place]  Coletta Memos, [place]

## 2019-09-20 ENCOUNTER — Other Ambulatory Visit: Payer: Self-pay | Admitting: Physician Assistant

## 2019-09-26 ENCOUNTER — Other Ambulatory Visit: Payer: Self-pay | Admitting: Cardiovascular Disease

## 2019-10-17 ENCOUNTER — Ambulatory Visit: Payer: Medicare Other | Admitting: Cardiovascular Disease

## 2019-11-14 ENCOUNTER — Ambulatory Visit: Payer: Medicare Other | Admitting: Cardiovascular Disease

## 2019-11-28 ENCOUNTER — Encounter: Payer: Self-pay | Admitting: Cardiovascular Disease

## 2019-11-28 ENCOUNTER — Telehealth (INDEPENDENT_AMBULATORY_CARE_PROVIDER_SITE_OTHER): Payer: Medicare Other | Admitting: Cardiovascular Disease

## 2019-11-28 VITALS — BP 143/60 | HR 61 | Ht 60.0 in | Wt 157.4 lb

## 2019-11-28 DIAGNOSIS — I4819 Other persistent atrial fibrillation: Secondary | ICD-10-CM

## 2019-11-28 DIAGNOSIS — I471 Supraventricular tachycardia: Secondary | ICD-10-CM

## 2019-11-28 DIAGNOSIS — R0789 Other chest pain: Secondary | ICD-10-CM | POA: Diagnosis not present

## 2019-11-28 DIAGNOSIS — I10 Essential (primary) hypertension: Secondary | ICD-10-CM

## 2019-11-28 NOTE — Progress Notes (Signed)
Virtual Visit via Telephone Note   This visit type was conducted due to national recommendations for restrictions regarding the COVID-19 Pandemic (e.g. social distancing) in an effort to limit this patient's exposure and mitigate transmission in our community.  Due to her co-morbid illnesses, this patient is at least at moderate risk for complications without adequate follow up.  This format is felt to be most appropriate for this patient at this time.  The patient did not have access to video technology/had technical difficulties with video requiring transitioning to audio format only (telephone).  All issues noted in this document were discussed and addressed.  No physical exam could be performed with this format.  Please refer to the patient's chart for her  consent to telehealth for [facility].   The patient was identified using 2 identifiers.  Date:  11/28/2019   ID:  Mary Farrell, DOB January 30, 1945, MRN [id number]  Patient Location: Home Provider Location: Office/Clinic  PCP:  Lawerance Cruel, MD  Cardiologist:  Skeet Latch, MD  Electrophysiologist:  [name], MD   Evaluation Performed:  Follow-Up Visit  Chief Complaint:  hyertension  History of Present Illness:     The patient does not have symptoms concerning for COVID-19 infection (fever, chills, cough, or new shortness of breath).    History of Present Illness: Mary Farrell is a 74 y.o. female with moderate aortic regurgitation, Rheumatic mitral stenosis s/p MVR, chronic pericardial effusion, paroxysmal atrial flutter s/p ablation 05/2017, MAZE 11/2018 and hypertension who presents for follow up.  Mary Farrell was previously a patient of Mary Farrell.  She followed up with Mary Farrell on 04/2015 due to shortness of breath that she felt was getting worse.  She was referred for an echo 04/2015 that revealed LVEF 55-60% with moderate AR and mild-moderate MS.  There was also a moderate pericardial effusion but  no evidence of tamponade.  She had a heart cath 12/1994 that revealed normal coronaries with mild MS and mild pulmonary hypertension.  She also had a Cardiolite in 2007 that was negative for ischemia.  She was seen in clinic 07/2015 and reported persistent shortness of breath with minimal exertion and chest discomfort.  She was referred for exercise Myoview 08/26/15 that revealed LVEF 60% and no perfusion defects.    Mary Farrell was admitted 08/2015 with atrial flutter.  She had an echo that showed a persistent moderate-severe pericardial effusion but no tamponade.  She was started on amiodarone, which she did not tolerate due to nausea.  She underwent DCCV on 09/22/15 and then was started on flecainide.  She continued to report shortness of breath and was again referred for an echo that revealed LVEF 60-65% with grade 2 diastolic dysfunction, mild aortic regurgitation, and moderate mitral stenosis. She had a moderate pericardial effusion localized to the inferior and inferolateral walls. There was no evidence of tamponade.  Mary Farrell had an outpatient DCCV on 10/1 and converted with one shock at [place].  She developed recurrent atrial fibrillation and was referred to EP.  She underwent afib/flutter ablation with Dr. Curt Bears on 06/24/17. She was started on flecainide but had recurent disease and was started on Tikosyn.  Her mitral valve was replaced 11/2018 (31 mm [facility] bioprosthetic).  She had a MAZE at that time. She later was Dr. Curt Bears and was in a junctional rhythm so diltiazem was discontinued.  She had recurrent episode of atrial fibrillation and took a dose of diltiazem she subsequently converted back to  sinus rhythm.  At her last appointment her blood pressure was elevated so she switched from losartan to valsartan.  She also had chest pain that was thought to be atypical, especially in setting of normal coronaries prior to her surgery.  It was thought that her symptoms may be related to GERD and her  PPI was increased.  She continues to struggle to get her systolic BP under 161.  She recalls  She hasn't been able to get much exercise because of hip and leg pain.  She limits her sodium intake.  She hasn't experienced any atrial fibrillation.  Her blood pressure tends to run in the 096E systolic.  She notes that she is had some swelling and feels bloated.  Her breathing is okay and she denies orthopnea or PND.  She is afraid to take Lasix because of how everything may interact with her dofetilide.   Past Medical History:  Diagnosis Date  . Anxiety 06/29/2012  . Aortic regurgitation 08/13/2015  . Arthritis    "hands, wrists, back, feet, toes" (06/24/2017)  . Atrial flutter ([place]) 09/18/2015  . Atypical chest pain 05/13/2016  . Chest pain    remote cath in 1996 with NORMAL coronaries noted  . CHF (congestive heart failure) ([place])   . Dyspnea 10/30/2010  . Essential hypertension 09/05/2013  . Exogenous obesity    history of   . GERD (gastroesophageal reflux disease)   . Glaucoma, both eyes   . Headache, migraine    "stopped in my 50's" (09/18/2015)  . History of cardiovascular stress test 2007   showing no ischemia  . Hypertension   . Mitral stenosis and aortic insufficiency 06/23/2010  . Mitral stenosis with insufficiency   . Mitral stenosis with regurgitation   . Murmur    of mitral stenosis and moderate aortic insufficiency      . Nausea 05/01/2018  . OSA on CPAP   . Palpitations    occasional  . Paroxysmal A-fib ([place]) 06/24/2017  . Pericardial effusion 09/18/2015  . Persistent atrial fibrillation ([place]) 03/10/2017  . Personal history of rheumatic heart disease   . S/P minimally-invasive maze operation for atrial fibrillation 12/13/2018   Complete bilateral atrial lesion set using cryothermy and bipolar radiofrequency ablation with clipping of LA appendage via right mini-thoracotomy approach  . S/P minimally-invasive mitral valve replacement with bioprosthetic valve 12/13/2018   31 mm  [facility] Mitral stented bovine pericardial tissue valve  . SBE (subacute bacterial endocarditis)    prophylaxis, patient unaware  . Sliding hiatal hernia   . Vertigo 05/01/2018    Past Surgical History:  Procedure Laterality Date  . ABDOMINAL HYSTERECTOMY  1975  . APPENDECTOMY  1977  . ATRIAL FIBRILLATION ABLATION N/A 03/10/2017   Procedure: ATRIAL FIBRILLATION ABLATION;  Surgeon: [name], MD;  Location: [place] CV LAB;  Service: Cardiovascular;  Laterality: N/A;  . ATRIAL FIBRILLATION ABLATION  06/24/2017  . ATRIAL FIBRILLATION ABLATION N/A 06/24/2017   Procedure: ATRIAL FIBRILLATION ABLATION;  Surgeon: [name], MD;  Location: [place] CV LAB;  Service: Cardiovascular;  Laterality: N/A;  . BUNIONECTOMY Right 2000s  . CARDIAC CATHETERIZATION  01/13/1995   normal coronary anatomy and mild mitral stenosis and mild pulmonary hypertension  . CARDIOVERSION N/A 09/22/2015   Procedure: CARDIOVERSION;  Surgeon: Jerline Pain, MD;  Location: [facility] ENDOSCOPY;  Service: Cardiovascular;  Laterality: N/A;  . CARDIOVERSION N/A 10/25/2016   Procedure: CARDIOVERSION;  Surgeon: Fay Records, MD;  Location: [place];  Service: Cardiovascular;  Laterality: N/A;  . CARDIOVERSION N/A 04/15/2017   Procedure: CARDIOVERSION;  Surgeon: Josue Hector, MD;  Location: [facility] ENDOSCOPY;  Service: Cardiovascular;  Laterality: N/A;  . CARDIOVERSION N/A 05/16/2017   Procedure: CARDIOVERSION;  Surgeon: Pixie Casino, MD;  Location: [facility] ENDOSCOPY;  Service: Cardiovascular;  Laterality: N/A;  . CARDIOVERSION N/A 01/24/2019   Procedure: CARDIOVERSION;  Surgeon: Pixie Casino, MD;  Location: [facility] ENDOSCOPY;  Service: Cardiovascular;  Laterality: N/A;  . CATARACT EXTRACTION W/ INTRAOCULAR LENS  IMPLANT, BILATERAL Bilateral 2013  . COLONOSCOPY  2013  . EYE SURGERY Bilateral    cataracts  . LAPAROSCOPIC CHOLECYSTECTOMY  1997  . MINIMALLY INVASIVE MAZE PROCEDURE N/A 12/13/2018   Procedure:  MINIMALLY INVASIVE MAZE PROCEDURE using a 45 MM AtriClip.;  Surgeon: Rexene Alberts, MD;  Location: [place];  Service: Open Heart Surgery;  Laterality: N/A;  . MITRAL VALVE REPLACEMENT Right 12/13/2018   Procedure: MINIMALLY INVASIVE MITRAL VALVE (MV) REPLACEMENT using a Magna Mitral Ease 31 MM Valve.;  Surgeon: Rexene Alberts, MD;  Location: [place];  Service: Open Heart Surgery;  Laterality: Right;  . RIGHT/LEFT HEART CATH AND CORONARY ANGIOGRAPHY N/A 10/18/2018   Procedure: RIGHT/LEFT HEART CATH AND CORONARY ANGIOGRAPHY;  Surgeon: Leonie Man, MD;  Location: [place] CV LAB;  Service: Cardiovascular;  Laterality: N/A;  . TEE WITHOUT CARDIOVERSION N/A 06/23/2017   Procedure: TRANSESOPHAGEAL ECHOCARDIOGRAM (TEE);  Surgeon: Fay Records, MD;  Location: [place];  Service: Cardiovascular;  Laterality: N/A;  . TEE WITHOUT CARDIOVERSION N/A 10/17/2018   Procedure: TRANSESOPHAGEAL ECHOCARDIOGRAM (TEE);  Surgeon: Jerline Pain, MD;  Location: [facility] ENDOSCOPY;  Service: Cardiovascular;  Laterality: N/A;  . TEE WITHOUT CARDIOVERSION N/A 12/13/2018   Procedure: TRANSESOPHAGEAL ECHOCARDIOGRAM (TEE);  Surgeon: Rexene Alberts, MD;  Location: [place];  Service: Open Heart Surgery;  Laterality: N/A;  . TONSILLECTOMY AND ADENOIDECTOMY  1990s  . UVULOPALATOPHARYNGOPLASTY, TONSILLECTOMY AND SEPTOPLASTY  1990s     Current Outpatient Medications  Medication Sig Dispense Refill  . acetaminophen (TYLENOL) 500 MG tablet Take 1,000 mg by mouth every 6 (six) hours as needed for moderate pain or headache.     . ALPRAZolam (XANAX) 0.25 MG tablet Take 0.125 mg by mouth 2 (two) times daily as needed for anxiety.     . carvedilol (COREG) 12.5 MG tablet TAKE 1 TABLET BY MOUTH 2 (TWO) TIMES DAILY WITH A MEAL. NEED OFFICE VISIT FOR FUTURE REFILL 180 tablet 3  . cetirizine (ZYRTEC) 10 MG tablet Take 10 mg by mouth at bedtime.     . Cholecalciferol (VITAMIN D) 2000 UNITS CAPS Take 2,000 Units by mouth daily.      Marland Kitchen  conjugated estrogens (PREMARIN) vaginal cream Place 1 Applicatorful vaginally daily as needed (irritation).     . dofetilide (TIKOSYN) 500 MCG capsule TAKE 1 CAPSULE (500 MCG TOTAL) BY MOUTH 2 (TWO) TIMES DAILY. 180 capsule 3  . dorzolamide-timolol (COSOPT) 22.3-6.8 MG/ML ophthalmic solution Place 1 drop into both eyes daily.    Marland Kitchen ELIQUIS 5 MG TABS tablet TAKE 1 TABLET BY MOUTH  TWICE DAILY 180 tablet 1  . famotidine (PEPCID) 20 MG tablet Take 20 mg by mouth at bedtime.    . fluticasone (FLONASE) 50 MCG/ACT nasal spray Place 1 spray into both nostrils daily.    . furosemide (LASIX) 40 MG tablet Take 40 mg by mouth as directed. TAKE 1/2 TABLET DAILY    . gabapentin (NEURONTIN) 300 MG capsule Take 300 mg by mouth 3 (three) times daily.     Marland Kitchen  lactobacillus acidophilus (BACID) TABS tablet Take 1 tablet by mouth daily.     Marland Kitchen latanoprost (XALATAN) 0.005 % ophthalmic solution Place 1 drop into both eyes at bedtime.   6  . Multiple Vitamin (MULTIVITAMIN WITH MINERALS) TABS tablet Take 1 tablet by mouth daily.    . NON FORMULARY Apply 1 application topically 2 (two) times daily as needed (for eczema). Traimcinolone/[employer] Moist Cream    . omeprazole (PRILOSEC) 40 MG capsule Take 40 mg by mouth daily.     Marland Kitchen oxybutynin (DITROPAN-XL) 10 MG 24 hr tablet Take 10 mg by mouth daily.     . polyethylene glycol (MIRALAX / GLYCOLAX) 17 g packet Take 17 g by mouth daily as needed for moderate constipation.     Marland Kitchen PRESCRIPTION MEDICATION Inhale into the lungs at bedtime. CPAP    . valsartan (DIOVAN) 320 MG tablet Take 1 tablet (320 mg total) by mouth daily. 90 tablet 1   No current facility-administered medications for this visit.    Allergies:   Amoxicillin, Metoprolol tartrate, and Oxaprozin    Social History:  The patient  reports that she has never smoked. She has never used smokeless tobacco. She reports current alcohol use. She reports that she does not use drugs.   Family History:  The patient's  family  history includes Diabetes in her brother; Heart attack in her mother; Heart failure in her maternal grandfather; Hypertension in her brother; Kidney disease in her brother.    ROS:  Please see the history of present illness.   Otherwise, review of systems are positive for none.   All other systems are reviewed and negative.    PHYSICAL EXAM: BP (!) 143/60   Pulse 61   Ht 5' (1.524 m)   Wt 157 lb 6.4 oz (71.4 kg)   BMI 30.74 kg/m  GENERAL: Sounds well.  No acute distress. RESP: Respirations unlabored. NEURO:  Speech fluent.   PSYCH:  Cognitively intact, oriented to person place and time   EKG:  EKG is not ordered today. The ekg ordered 05/07/15 demonstraSinus bradycardia rate 57 bpm. 05/13/16: Sinus rhythm. Rate 66 bpm. 09/09/16: Sinus rhythm. Rate 64 bpm.   10/26/16: Sinus rhythm.  Rate 74 bpm.  Exercise Myoveiw 08/26/15:  The left ventricular ejection fraction is normal (55-65%).  Nuclear stress EF: 60%.  Blood pressure demonstrated a hypertensive response to exercise.  Upsloping ST segment depression ST segment depression of 1 mm was noted during stress in the II, III, V6, V5 and aVF leads.  This is a low risk study.   No reversible ischemia. LVEF 60% with normal wall motion. Fair exercise tolerance. No chest pain. This is a low risk study.  TTE 01/23/19: 1. Left ventricular ejection fraction, by visual estimation, is 55 to  60%. The left ventricle has normal function. Left ventricular septal wall  thickness was mildly increased. Moderately increased left ventricular  posterior wall thickness. There is  mildly increased left ventricular hypertrophy.  2. Left ventricular diastolic parameters are indeterminate.  3. The left ventricle demonstrates regional wall motion abnormalities.  4. Elevated LVEDP. Hypokinesis of the basal septum consistent with  post-operative state.  5. Global right ventricle has normal systolic function.The right  ventricular size is normal. No  increase in right ventricular wall  thickness.  6. Left atrial size was severely dilated.  7. Right atrial size was normal.  8. The mitral valve has been repaired/replaced. No evidence of mitral  valve regurgitation. No evidence of mitral stenosis.  9. The tricuspid valve is normal in structure.  10. The aortic valve is tricuspid. Aortic valve regurgitation is mild. No  evidence of aortic valve sclerosis or stenosis.  11. The pulmonic valve was normal in structure. Pulmonic valve  regurgitation is not visualized.  12. Normal pulmonary artery systolic pressure.  13. The inferior vena cava is normal in size with <50% respiratory  variability, suggesting right atrial pressure of 8 mmHg.   Recent Labs: 01/22/2019: Hemoglobin 12.0; Platelets 355 01/28/2019: ALT 13 06/08/2019: BUN 11; Creatinine, Ser 0.78; Potassium 4.0; Sodium 140 06/19/2019: Magnesium 2.3   07/05/16: Total cholesterol 167, triglycerides 159, HDL 47, LDL 88 TSH 2.2 Sodium 141, potassium 3.8, BUN 14, creatinine 0.77 AST 13, ALT 14  08/03/2017: Total cholesterol 215, triglycerides 121, HDL 67, LDL 124  07/24/2019:  Sodium 137, potassium 4.4, BUN 14, creatinine 0.72 AST 14, ALT 15 Total cholesterol 168, triglycerides 94, HDL 62, LDL 89 TSH 2.39  Lipid Panel No results found for: CHOL, TRIG, HDL, CHOLHDL, VLDL, LDLCALC, LDLDIRECT    Wt Readings from Last 3 Encounters:  11/28/19 157 lb 6.4 oz (71.4 kg)  09/11/19 156 lb (70.8 kg)  06/19/19 154 lb 12.8 oz (70.2 kg)     ASSESSMENT AND PLAN:  # Hypertension:  BP remains above goal despite starting losartan.  Multiple medications cannot be taken due to dofetilide.  We will increase her furosemide to 20 mg every day.  She will not take potassium for now.  Check a BMP and a magnesium in 1 week.  If in 3 days her blood pressure remains elevated, she will start doxazosin 2 mg nightly.  Continue carvedilol and valsartan.  # Persitent atrial flutter/fibrillation:  Now  maintaining sinus rhythm on Tikosyn.  Continue carvedilol and Eliquis.  # Mitral stenosis s/p bioprosthetic MVR: # Rheumatic heart disease: TEE showed severe MS.  She underwent successful minimally invasive bioprosthetic MVR with Dr. Roxy Manns 11/2018.  Mean gradient 5 mmHg 12/2018.    # Hyperlipidemia: ASCVD 10 year risk 15%.  She wants to really focus on diet and exercise before starting a statin.  We will recheck in 4 months and reevaluate.  # Atypical chest pain:  Resolved with treatment of GERD.  COVID-19 Education: The signs and symptoms of COVID-19 were discussed with the patient and how to seek care for testing (follow up with PCP or arrange E-visit).  The importance of social distancing was discussed today.   Time:   Today, I have spent 22 minutes with the patient with telehealth technology discussing the above problems.     Current medicines are reviewed at length with the patient today.  The patient does not have concerns regarding medicines.  The following changes have been made:  Switch losartan tot valsartan  Labs/ tests ordered today include:   Orders Placed This Encounter  Procedures  . Basic metabolic panel  . Magnesium     Disposition:   FU with Daniesha Driver C. Oval Linsey, MD, [facility] in 2 months     Signed, Jeffry Vogelsang C. Oval Linsey, MD, [facility]  11/28/2019 11:18 AM    [place]

## 2019-11-28 NOTE — Patient Instructions (Addendum)
Medication Instructions:  START FUROSEMIDE 20 MG DAILY, DO NOT TAKE POTASSIUM   IF YOU BLOOD PRESSURE IS STILL RUNNING OVER 130/80 Monday START DOXAZOSIN 2 MG AT BEDTIME.  SEND MYCHART MESSAGE WITH UPDATE Monday AND IF YOU NEED WILL SEND PRESCRIPTION TO PHARMACY   *If you need a refill on your cardiac medications before your next appointment, please call your pharmacy*  Lab Work: BMET/MAGNESIUM 1 WEEK   If you have labs (blood work) drawn today and your tests are completely normal, you will receive your results only by: Marland Kitchen MyChart Message (if you have MyChart) OR . A paper copy in the mail If you have any lab test that is abnormal or we need to change your treatment, we will call you to review the results.  Testing/Procedures: NONE   Follow-Up: KEEP FOLLOW UP AS SCHEDULED   IF BLOOD PRESSURE BETTER WILL CHANGE YOUR FOLLOW UP TO 4 MONTHS INSTEAD OF LATER THIS MONTH

## 2019-12-05 LAB — BASIC METABOLIC PANEL
BUN/Creatinine Ratio: 12 (ref 12–28)
BUN: 11 mg/dL (ref 8–27)
CO2: 30 mmol/L — ABNORMAL HIGH (ref 20–29)
Calcium: 9.3 mg/dL (ref 8.7–10.3)
Chloride: 94 mmol/L — ABNORMAL LOW (ref 96–106)
Creatinine, Ser: 0.89 mg/dL (ref 0.57–1.00)
GFR calc Af Amer: 73 mL/min/{1.73_m2} (ref >59)
GFR calc non Af Amer: 64 mL/min/{1.73_m2} (ref >59)
Glucose: 94 mg/dL (ref 65–99)
Potassium: 4.6 mmol/L (ref 3.5–5.2)
Sodium: 137 mmol/L (ref 134–144)

## 2019-12-05 LAB — MAGNESIUM: Magnesium: 2 mg/dL (ref 1.6–2.3)

## 2019-12-17 ENCOUNTER — Ambulatory Visit: Payer: Medicare Other | Admitting: Cardiovascular Disease

## 2019-12-25 ENCOUNTER — Other Ambulatory Visit: Payer: Self-pay | Admitting: Cardiovascular Disease

## 2019-12-25 NOTE — Telephone Encounter (Signed)
Prescription refill request for Eliquis received. Indication: Atrial Fibrillation Last office visit: 11/2019  Oval Linsey Scr: 0.89  11/2019 Age: 74 Weight:71.4 kg  Prescription refilled

## 2019-12-26 ENCOUNTER — Ambulatory Visit: Payer: Medicare Other | Admitting: Cardiovascular Disease

## 2019-12-31 ENCOUNTER — Ambulatory Visit (INDEPENDENT_AMBULATORY_CARE_PROVIDER_SITE_OTHER): Payer: Medicare Other | Admitting: Thoracic Surgery (Cardiothoracic Vascular Surgery)

## 2019-12-31 ENCOUNTER — Encounter: Payer: Self-pay | Admitting: Thoracic Surgery (Cardiothoracic Vascular Surgery)

## 2019-12-31 ENCOUNTER — Other Ambulatory Visit: Payer: Self-pay

## 2019-12-31 VITALS — BP 138/75 | HR 65 | Resp 18 | Ht 60.0 in | Wt 165.0 lb

## 2019-12-31 DIAGNOSIS — Z8679 Personal history of other diseases of the circulatory system: Secondary | ICD-10-CM

## 2019-12-31 DIAGNOSIS — Z9889 Other specified postprocedural states: Secondary | ICD-10-CM | POA: Diagnosis not present

## 2019-12-31 DIAGNOSIS — Z953 Presence of xenogenic heart valve: Secondary | ICD-10-CM

## 2019-12-31 NOTE — Patient Instructions (Signed)

## 2019-12-31 NOTE — Progress Notes (Signed)
[facility] [address]       [place] [address]             [phone]     CARDIOTHORACIC SURGERY OFFICE NOTE  Referring Provider is Skeet Latch, MD Electrophysiology Cardiologist is Will Meredith Leeds, MD PCP is Lawerance Cruel, MD   HPI:  Patient is a 74 year old female with rheumatic heart disease including mitral stenosis, aortic insufficiency, and recurrent paroxysmal atrial fibrillation and atrial flutter who underwent minimally invasive mitral valve replacement using a bioprosthetic tissue valve and maze procedure on December 13, 2018.  The patient's early postoperative recovery was uncomplicated although notable for the development of postoperative atrial fibrillation.  She was last seen in our office on March 26, 2018 at which time she was doing well and maintaining sinus rhythm.  Since then she has been followed carefully by Dr. Oval Linsey.  She returns her office today and reports that she is doing very well.  Overall she states that she feels "much improved" in comparison with how she felt prior to her heart surgery.  She states that she only gets short of breath with very strenuous exertion and this does not limit her to any degree.  Her breathing is dramatically better than it was before.  She never has any chest pain or chest tightness either with activity or at rest.  Overall her functional status is quite good.  She has had no symptoms to suggest a recurrence of atrial fibrillation.  She remains anticoagulated using Eliquis.  She remains on Tikosyn at this time.   Current Outpatient Medications  Medication Sig Dispense Refill  . acetaminophen (TYLENOL) 500 MG tablet Take 1,000 mg by mouth every 6 (six) hours as needed for moderate pain or headache.     . ALPRAZolam (XANAX) 0.25 MG tablet Take 0.125 mg by mouth 2 (two) times daily as needed for anxiety.     . carvedilol (COREG) 12.5 MG tablet TAKE 1 TABLET BY MOUTH 2 (TWO) TIMES DAILY WITH A MEAL. NEED  OFFICE VISIT FOR FUTURE REFILL 180 tablet 3  . cetirizine (ZYRTEC) 10 MG tablet Take 10 mg by mouth at bedtime.     . Cholecalciferol (VITAMIN D) 2000 UNITS CAPS Take 2,000 Units by mouth daily.      Marland Kitchen conjugated estrogens (PREMARIN) vaginal cream Place 1 Applicatorful vaginally daily as needed (irritation).     . dofetilide (TIKOSYN) 500 MCG capsule TAKE 1 CAPSULE (500 MCG TOTAL) BY MOUTH 2 (TWO) TIMES DAILY. 180 capsule 3  . dorzolamide-timolol (COSOPT) 22.3-6.8 MG/ML ophthalmic solution Place 1 drop into both eyes daily.    Marland Kitchen ELIQUIS 5 MG TABS tablet TAKE 1 TABLET BY MOUTH  TWICE DAILY 180 tablet 1  . famotidine (PEPCID) 20 MG tablet Take 20 mg by mouth at bedtime.    . fluticasone (FLONASE) 50 MCG/ACT nasal spray Place 1 spray into both nostrils daily.    . furosemide (LASIX) 40 MG tablet Take 40 mg by mouth as directed. TAKE 1/2 TABLET DAILY    . gabapentin (NEURONTIN) 300 MG capsule Take 300 mg by mouth 3 (three) times daily.     Marland Kitchen lactobacillus acidophilus (BACID) TABS tablet Take 1 tablet by mouth daily.     Marland Kitchen latanoprost (XALATAN) 0.005 % ophthalmic solution Place 1 drop into both eyes at bedtime.   6  . Multiple Vitamin (MULTIVITAMIN WITH MINERALS) TABS tablet Take 1 tablet by mouth daily.    . NON FORMULARY Apply 1  application topically 2 (two) times daily as needed (for eczema). Traimcinolone/[employer] Moist Cream    . omeprazole (PRILOSEC) 40 MG capsule Take 40 mg by mouth daily.     Marland Kitchen oxybutynin (DITROPAN-XL) 10 MG 24 hr tablet Take 10 mg by mouth daily.     . polyethylene glycol (MIRALAX / GLYCOLAX) 17 g packet Take 17 g by mouth daily as needed for moderate constipation.     Marland Kitchen PRESCRIPTION MEDICATION Inhale into the lungs at bedtime. CPAP    . valsartan (DIOVAN) 320 MG tablet Take 1 tablet (320 mg total) by mouth daily. 90 tablet 1   No current facility-administered medications for this visit.      Physical Exam:   BP 138/75 (BP Location: Right Arm, Patient Position: Sitting)    Pulse 65   Resp 18   Ht 5' (1.524 m)   Wt 165 lb (74.8 kg)   SpO2 94% Comment: RA with mask on  BMI 32.22 kg/m   General:  Well-appearing  Chest:   Clear to auscultation  CV:   Regular rate and rhythm without murmur  Incisions:  Completely healed  Abdomen:  Soft nontender  Extremities:  Warm and well-perfused  Diagnostic Tests:  2 channel telemetry rhythm strip demonstrates normal sinus rhythm   Impression:  Patient is doing very well and maintaining sinus rhythm approximately 1 year status post minimally invasive mitral valve replacement with a bioprosthetic tissue valve and maze procedure.  Plan:  We have not recommended any change the patient's current medications.  She will continue to follow-up with Dr. Oval Linsey for long-term management of hypertension, chronic diastolic congestive heart failure, and atrial fibrillation.  Whether or not it might be reasonable to consider stopping Tikosyn at some point will be deferred to Dr. Oval Linsey and Dr. Curt Bears.  I would tend to favor continuing long-term anticoagulation.  Although the patient is maintaining sinus rhythm and left atrial appendage was clipped at the time of her surgery, with her age, left atrial size, and history of rheumatic mitral stenosis she probably still has a small but significant risk of thromboembolism in the absence of anticoagulation.  The patient has been reminded regarding the importance of dental hygiene and the lifelong need for antibiotic prophylaxis for all dental cleanings and other related invasive procedures.  In the future the patient will call return to our office only should specific problems or questions arise.  All questions answered.   I spent in excess of 15 minutes during the conduct of this office consultation and >50% of this time involved direct face-to-face encounter with the patient for counseling and/or coordination of their care.    [name]. Roxy Manns, MD 12/31/2019 1:45 PM

## 2020-01-18 ENCOUNTER — Other Ambulatory Visit: Payer: Self-pay

## 2020-01-18 ENCOUNTER — Emergency Department (HOSPITAL_COMMUNITY): Payer: Medicare Other

## 2020-01-18 ENCOUNTER — Emergency Department (HOSPITAL_COMMUNITY)
Admission: EM | Admit: 2020-01-18 | Discharge: 2020-01-19 | Disposition: A | Discharge status: Home / Self Care | Payer: Medicare Other | Attending: Emergency Medicine | Admitting: Emergency Medicine

## 2020-01-18 ENCOUNTER — Encounter (HOSPITAL_COMMUNITY): Payer: Self-pay | Admitting: Emergency Medicine

## 2020-01-18 DIAGNOSIS — I110 Hypertensive heart disease with heart failure: Secondary | ICD-10-CM | POA: Diagnosis not present

## 2020-01-18 DIAGNOSIS — I471 Supraventricular tachycardia: Secondary | ICD-10-CM | POA: Insufficient documentation

## 2020-01-18 DIAGNOSIS — I509 Heart failure, unspecified: Secondary | ICD-10-CM | POA: Diagnosis not present

## 2020-01-18 DIAGNOSIS — Z7952 Long term (current) use of systemic steroids: Secondary | ICD-10-CM | POA: Insufficient documentation

## 2020-01-18 DIAGNOSIS — R002 Palpitations: Secondary | ICD-10-CM | POA: Diagnosis present

## 2020-01-18 LAB — BASIC METABOLIC PANEL
Anion gap: 8 (ref 5–15)
BUN: 11 mg/dL (ref 8–23)
CO2: 27 mmol/L (ref 22–32)
Calcium: 9.4 mg/dL (ref 8.9–10.3)
Chloride: 105 mmol/L (ref 98–111)
Creatinine, Ser: 0.79 mg/dL (ref 0.44–1.00)
GFR, Estimated: >60 mL/min (ref >60)
Glucose, Bld: 109 mg/dL — ABNORMAL HIGH (ref 70–99)
Potassium: 4.5 mmol/L (ref 3.5–5.1)
Sodium: 140 mmol/L (ref 135–145)

## 2020-01-18 LAB — CBC
HCT: 38.1 % (ref 36.0–46.0)
Hemoglobin: 11.3 g/dL — ABNORMAL LOW (ref 12.0–15.0)
MCH: 26.8 pg (ref 26.0–34.0)
MCHC: 29.7 g/dL — ABNORMAL LOW (ref 30.0–36.0)
MCV: 90.3 fL (ref 80.0–100.0)
Platelets: 191 10*3/uL (ref 150–400)
RBC: 4.22 MIL/uL (ref 3.87–5.11)
RDW: 15.2 % (ref 11.5–15.5)
WBC: 6 10*3/uL (ref 4.0–10.5)
nRBC: 0 % (ref 0.0–0.2)

## 2020-01-18 LAB — BRAIN NATRIURETIC PEPTIDE: B Natriuretic Peptide: 123.4 pg/mL — ABNORMAL HIGH (ref 0.0–100.0)

## 2020-01-18 LAB — TROPONIN I (HIGH SENSITIVITY): Troponin I (High Sensitivity): 8 ng/L (ref <18)

## 2020-01-18 MED ORDER — ADENOSINE 6 MG/2ML IV SOLN
6.0000 mg | Freq: Once | INTRAVENOUS | Status: AC
  Administered 2020-01-18: 20:00:00 6 mg via INTRAVENOUS
  Filled 2020-01-18: qty 2

## 2020-01-18 MED ORDER — ADENOSINE 6 MG/2ML IV SOLN
INTRAVENOUS | Status: AC
  Filled 2020-01-18: qty 4

## 2020-01-18 NOTE — ED Notes (Signed)
Updated that we are waiting on lab results, they verbalized understanding of same.

## 2020-01-18 NOTE — ED Provider Notes (Signed)
Patient is a 74 year old female whose care was transferred to me from Dr. Lyndel Safe.  Her HPI is below:  Pt is a 74 y/o female with a history of atrial fibrillation s/p multiple ablations and tikosyn load, CHF, HTN, s/p mitral valve replacement, who presents to ED with complaint of palpitations/tachycardia. Pt states that over the last few months she has had a few scattered episodes of palpitations (prior to that, she had been doing much better with very few episodes since she was started on Tikosyn last year). Yesterday afternoon she had sudden onset of palpitations, which resolved but recurred this evening. Throughout the day earlier today, she has had a sensation of "tenderness" in the chest, and then tonight more palpitations described as "like a fish is flopping around in my chest." Associated dyspnea and some nausea. Has not missed any doses of her anti-arrhythmics or anticoagulant. Pt does note that this is almost exactly 1 year from the date of her brother's death, and she had a significant episode of palpitations at this time last year.  Physical Exam  BP (!) 141/53   Pulse 70   Temp 98 F (36.7 C) (Oral)   Resp 15   Ht 5' (1.524 m)   Wt 73.5 kg   SpO2 96%   BMI 31.64 kg/m   Physical Exam Vitals and nursing note reviewed.  Constitutional:      General: She is not in acute distress.    Appearance: Normal appearance. She is well-developed and well-nourished. She is not ill-appearing or toxic-appearing.  HENT:     Head: Normocephalic and atraumatic.     Nose: Nose normal.     Mouth/Throat:     Mouth: Mucous membranes are moist.     Pharynx: Oropharynx is clear.  Eyes:     General: No scleral icterus.    Extraocular Movements: Extraocular movements intact.  Cardiovascular:     Rate and Rhythm: Regular rhythm. Tachycardia present.     Pulses: Normal pulses.     Heart sounds: No friction rub. No gallop.   Pulmonary:     Effort: Pulmonary effort is normal. No respiratory distress.      Breath sounds: Normal breath sounds. No wheezing, rhonchi or rales.  Abdominal:     Palpations: Abdomen is soft.     Tenderness: There is no abdominal tenderness. There is no guarding or rebound.  Musculoskeletal:        General: No edema.     Cervical back: Neck supple.     Comments: Trace bilateral nonpitting edema  Skin:    General: Skin is warm and dry.  Neurological:     Mental Status: She is alert.     Comments: Alert, grossly oriented, moves all extremities spontaneously, answering questions appropriately and following commands  Psychiatric:        Mood and Affect: Mood and affect normal.        Behavior: Behavior normal.  ED Course/Procedures   Clinical Course as of 01/19/20 0022  Sat Jan 19, 2020  0012 Magnesium: 1.7 [LJ]    Clinical Course User Index [LJ] Rayna Sexton, PA-C    Procedures  MDM  Patient is a 73 year old female presents the emergency department due to palpitations for the past 24 hours. They became persistent today. Was also experiencing shortness of breath. Patient's care was transferred to me at shift change from Dr. Lyndel Safe.  History of multiple ablations were atrial fibrillation. Maze procedure. Mitral valve replacement. History of CHF, hypertension, as well as  GERD.  Patient initially found to be in a narrow complex tachycardia. No relief with vagal maneuvers. Patient was given adenosine which converted patient to sinus rhythm.  BNP mildly elevated at 123.4. Hemoglobin at 11.3. Initial troponin VIII with a delta of 18. Patient discussed with cardiology who feel that discharge and outpatient follow-up is reasonable. Magnesium within normal limits but decreased to 1.7. Will give 2 g of IV magnesium.  Patient monitored in the emergency department for many hours. She is feeling much better and feels comfortable going home at this time. She was ambulated in the emergency department without issue. Feel the patient is safe for discharge at this time.  She was given strict return precautions. She understands to follow-up with cardiology next week. Her questions were answered and she was amicable at the time of discharge.    Rayna Sexton, PA-C 01/19/20 0200    Mesner, Corene Cornea, MD 01/19/20 [phone]

## 2020-01-18 NOTE — ED Triage Notes (Signed)
Pt arrives to ED with c/o of tachycardia. Pt states that around 430pm pt had sudden palpitations and her phone alerted here about a rapid hear rate. Pt states she's felt normal lately and denies any pain. Pts HR 140 in triage.

## 2020-01-18 NOTE — ED Notes (Signed)
Pt states she is feeling better than before. Pt stated she fell asleep and felt better when she woke up.

## 2020-01-18 NOTE — Discharge Instructions (Addendum)
Like we discussed, if you develop any new or worsening symptoms please return the emergency department. Otherwise, please follow-up with your cardiologist next week. Have a Merry Christmas.

## 2020-01-18 NOTE — ED Provider Notes (Signed)
[place] EMERGENCY DEPARTMENT Provider Note   CSN: [id number] Arrival date & time: 01/18/20  1748     History Chief Complaint  Patient presents with  . Tachycardia    Mary Farrell is a 74 y.o. female.  HPI Pt is a 74 y/o female with a history of atrial fibrillation s/p multiple ablations and tikosyn load, CHF, HTN, s/p mitral valve replacement, who presents to ED with complaint of palpitations/tachycardia. Pt states that over the last few months she has had a few scattered episodes of palpitations (prior to that, she had been doing much better with very few episodes since she was started on Tikosyn last year). Yesterday afternoon she had sudden onset of palpitations, which resolved but recurred this evening. Throughout the day earlier today, she has had a sensation of "tenderness" in the chest, and then tonight more palpitations described as "like a fish is flopping around in my chest." Associated dyspnea and some nausea. Has not missed any doses of her anti-arrhythmics or anticoagulant. Pt does note that this is almost exactly 1 year from the date of her brother's death, and she had a significant episode of palpitations at this time last year.     Past Medical History:  Diagnosis Date  . Anxiety 06/29/2012  . Aortic regurgitation 08/13/2015  . Arthritis    "hands, wrists, back, feet, toes" (06/24/2017)  . Atrial flutter ([place]) 09/18/2015  . Atypical chest pain 05/13/2016  . Chest pain    remote cath in 1996 with NORMAL coronaries noted  . CHF (congestive heart failure) ([place])   . Dyspnea 10/30/2010  . Essential hypertension 09/05/2013  . Exogenous obesity    history of   . GERD (gastroesophageal reflux disease)   . Glaucoma, both eyes   . Headache, migraine    "stopped in my 50's" (09/18/2015)  . History of cardiovascular stress test 2007   showing no ischemia  . Hypertension   . Mitral stenosis and aortic insufficiency 06/23/2010  . Mitral stenosis with  insufficiency   . Mitral stenosis with regurgitation   . Murmur    of mitral stenosis and moderate aortic insufficiency      . Nausea 05/01/2018  . OSA on CPAP   . Palpitations    occasional  . Paroxysmal A-fib ([place]) 06/24/2017  . Pericardial effusion 09/18/2015  . Persistent atrial fibrillation ([place]) 03/10/2017  . Personal history of rheumatic heart disease   . S/P minimally-invasive maze operation for atrial fibrillation 12/13/2018   Complete bilateral atrial lesion set using cryothermy and bipolar radiofrequency ablation with clipping of LA appendage via right mini-thoracotomy approach  . S/P minimally-invasive mitral valve replacement with bioprosthetic valve 12/13/2018   31 mm [facility] Mitral stented bovine pericardial tissue valve  . SBE (subacute bacterial endocarditis)    prophylaxis, patient unaware  . Sliding hiatal hernia   . Vertigo 05/01/2018    Patient Active Problem List   Diagnosis Date Noted  . SVT (supraventricular tachycardia) ([place]) 01/22/2019  . S/P minimally-invasive mitral valve replacement with bioprosthetic valve 12/13/2018  . S/P minimally-invasive maze operation for atrial fibrillation 12/13/2018  . Nausea 05/01/2018  . Vertigo 05/01/2018  . Lump on finger 12/30/2017  . Pain in finger 12/29/2017  . Persistent atrial fibrillation/flutter 03/10/2017  . Asymptomatic microscopic hematuria 11/02/2016  . Rash 06/08/2016  . Atypical chest pain 05/13/2016  . Pericardial effusion 09/18/2015  . Aortic regurgitation 08/13/2015  . Dysuria 09/24/2014  . IC (interstitial cystitis) 09/24/2014  . 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  Essential hypertension 09/05/2013  . Anxiety 06/29/2012  . GERD (gastroesophageal reflux disease) 03/11/2011  . Dyspnea 10/30/2010    Past Surgical History:  Procedure Laterality Date  . ABDOMINAL HYSTERECTOMY  1975  . APPENDECTOMY  1977  . ATRIAL FIBRILLATION ABLATION N/A 03/10/2017   Procedure: ATRIAL FIBRILLATION ABLATION;  Surgeon:  [name], MD;  Location: [place] CV LAB;  Service: Cardiovascular;  Laterality: N/A;  . ATRIAL FIBRILLATION ABLATION  06/24/2017  . ATRIAL FIBRILLATION ABLATION N/A 06/24/2017   Procedure: ATRIAL FIBRILLATION ABLATION;  Surgeon: [name], MD;  Location: [place] CV LAB;  Service: Cardiovascular;  Laterality: N/A;  . BUNIONECTOMY Right 2000s  . CARDIAC CATHETERIZATION  01/13/1995   normal coronary anatomy and mild mitral stenosis and mild pulmonary hypertension  . CARDIOVERSION N/A 09/22/2015   Procedure: CARDIOVERSION;  Surgeon: Jerline Pain, MD;  Location: [place];  Service: Cardiovascular;  Laterality: N/A;  . CARDIOVERSION N/A 10/25/2016   Procedure: CARDIOVERSION;  Surgeon: Fay Records, MD;  Location: [place];  Service: Cardiovascular;  Laterality: N/A;  . CARDIOVERSION N/A 04/15/2017   Procedure: CARDIOVERSION;  Surgeon: Josue Hector, MD;  Location: [facility] ENDOSCOPY;  Service: Cardiovascular;  Laterality: N/A;  . CARDIOVERSION N/A 05/16/2017   Procedure: CARDIOVERSION;  Surgeon: Pixie Casino, MD;  Location: [facility] ENDOSCOPY;  Service: Cardiovascular;  Laterality: N/A;  . CARDIOVERSION N/A 01/24/2019   Procedure: CARDIOVERSION;  Surgeon: Pixie Casino, MD;  Location: [facility] ENDOSCOPY;  Service: Cardiovascular;  Laterality: N/A;  . CATARACT EXTRACTION W/ INTRAOCULAR LENS  IMPLANT, BILATERAL Bilateral 2013  . COLONOSCOPY  2013  . EYE SURGERY Bilateral    cataracts  . LAPAROSCOPIC CHOLECYSTECTOMY  1997  . MINIMALLY INVASIVE MAZE PROCEDURE N/A 12/13/2018   Procedure: MINIMALLY INVASIVE MAZE PROCEDURE using a 45 MM AtriClip.;  Surgeon: Rexene Alberts, MD;  Location: [place];  Service: Open Heart Surgery;  Laterality: N/A;  . MITRAL VALVE REPLACEMENT Right 12/13/2018   Procedure: MINIMALLY INVASIVE MITRAL VALVE (MV) REPLACEMENT using a Magna Mitral Ease 31 MM Valve.;  Surgeon: Rexene Alberts, MD;  Location: [place];  Service: Open Heart Surgery;   Laterality: Right;  . RIGHT/LEFT HEART CATH AND CORONARY ANGIOGRAPHY N/A 10/18/2018   Procedure: RIGHT/LEFT HEART CATH AND CORONARY ANGIOGRAPHY;  Surgeon: Leonie Man, MD;  Location: [place] CV LAB;  Service: Cardiovascular;  Laterality: N/A;  . TEE WITHOUT CARDIOVERSION N/A 06/23/2017   Procedure: TRANSESOPHAGEAL ECHOCARDIOGRAM (TEE);  Surgeon: Fay Records, MD;  Location: [place];  Service: Cardiovascular;  Laterality: N/A;  . TEE WITHOUT CARDIOVERSION N/A 10/17/2018   Procedure: TRANSESOPHAGEAL ECHOCARDIOGRAM (TEE);  Surgeon: Jerline Pain, MD;  Location: [facility] ENDOSCOPY;  Service: Cardiovascular;  Laterality: N/A;  . TEE WITHOUT CARDIOVERSION N/A 12/13/2018   Procedure: TRANSESOPHAGEAL ECHOCARDIOGRAM (TEE);  Surgeon: Rexene Alberts, MD;  Location: [place];  Service: Open Heart Surgery;  Laterality: N/A;  . TONSILLECTOMY AND ADENOIDECTOMY  1990s  . UVULOPALATOPHARYNGOPLASTY, TONSILLECTOMY AND SEPTOPLASTY  1990s     OB History   No obstetric history on file.     Family History  Problem Relation Age of Onset  . Heart attack Mother   . Hypertension Brother   . Kidney disease Brother   . Diabetes Brother   . Heart failure Maternal Grandfather     Social History   Tobacco Use  . Smoking status: Never Smoker  . Smokeless tobacco: Never Used  Vaping Use  . Vaping Use: Never used  Substance Use Topics  .  Alcohol use: Yes    Comment: rarely  . Drug use: No    Home Medications Prior to Admission medications   Medication Sig Start Date End Date Taking? Authorizing Provider  acetaminophen (TYLENOL) 500 MG tablet Take 1,000 mg by mouth every 6 (six) hours as needed for moderate pain or headache.    Yes [provider]  ALPRAZolam (XANAX) 0.25 MG tablet Take 0.125 mg by mouth 2 (two) times daily as needed for anxiety.    Yes [provider]  Calcium Citrate-Vitamin D (CALCIUM CITRATE + D3) 200-250 MG-UNIT TABS Take 1 tablet by mouth daily with supper.    Yes [provider]  carvedilol (COREG) 12.5 MG tablet TAKE 1 TABLET BY MOUTH 2 (TWO) TIMES DAILY WITH A MEAL. NEED OFFICE VISIT FOR FUTURE REFILL Patient taking differently: Take 12.5 mg by mouth 2 (two) times daily with a meal. 08/29/19  Yes Skeet Latch, MD  cetirizine (ZYRTEC) 10 MG tablet Take 10 mg by mouth at bedtime.    Yes [provider]  Cholecalciferol (VITAMIN D) 2000 UNITS CAPS Take 2,000 Units by mouth daily.   Yes [provider]  conjugated estrogens (PREMARIN) vaginal cream Place 1 Applicatorful vaginally daily as needed (irritation).    Yes [provider]  Cyanocobalamin (VITAMIN B12) 1000 MCG TBCR Take 1,000 mcg by mouth daily.   Yes [provider]  dofetilide (TIKOSYN) 500 MCG capsule TAKE 1 CAPSULE (500 MCG TOTAL) BY MOUTH 2 (TWO) TIMES DAILY. 04/20/19  Yes Sarajane Jews, Callie E, PA-C  dorzolamide-timolol (COSOPT) 22.3-6.8 MG/ML ophthalmic solution Place 1 drop into both eyes daily.   Yes [provider]  ELIQUIS 5 MG TABS tablet TAKE 1 TABLET BY MOUTH  TWICE DAILY Patient taking differently: Take 5 mg by mouth 2 (two) times daily. 12/25/19  Yes Skeet Latch, MD  famotidine (PEPCID) 20 MG tablet Take 20 mg by mouth at bedtime.   Yes [provider]  fluticasone (FLONASE) 50 MCG/ACT nasal spray Place 1 spray into both nostrils daily as needed for allergies.   Yes [provider]  furosemide (LASIX) 40 MG tablet Take 20 mg by mouth daily as needed for fluid.   Yes [provider]  gabapentin (NEURONTIN) 300 MG capsule Take 300 mg by mouth 3 (three) times daily.    Yes [provider]  lactobacillus acidophilus (BACID) TABS tablet Take 1 tablet by mouth daily.    Yes [provider]  latanoprost (XALATAN) 0.005 % ophthalmic solution Place 1 drop into both eyes at bedtime.  06/17/14  Yes [provider]  Multiple Vitamin (MULTIVITAMIN WITH MINERALS) TABS tablet Take 1  tablet by mouth daily.   Yes [provider]  NON FORMULARY Apply 1 application topically 2 (two) times daily as needed (for eczema). Traimcinolone/[employer] Moist Cream   Yes [provider]  omeprazole (PRILOSEC) 40 MG capsule Take 40 mg by mouth daily.   Yes [provider]  oxybutynin (DITROPAN-XL) 10 MG 24 hr tablet Take 10 mg by mouth daily. 12/16/15  Yes [provider]  polyethylene glycol (MIRALAX / GLYCOLAX) 17 g packet Take 17 g by mouth daily as needed for moderate constipation.    Yes [provider]  potassium chloride SA (KLOR-CON) 20 MEQ tablet Take 20 mEq by mouth See admin instructions. Take as needed with furosemide   Yes [provider]  PRESCRIPTION MEDICATION Inhale into the lungs at bedtime. CPAP   Yes [provider]  valsartan (DIOVAN) 320  MG tablet Take 1 tablet (320 mg total) by mouth daily. 09/11/19  Yes Skeet Latch, MD    Allergies    Tizanidine, Amoxicillin, Metoprolol tartrate, and Oxaprozin  Review of Systems   Review of Systems  Constitutional: Negative for chills and fever.  HENT: Negative for ear pain and sore throat.   Eyes: Negative for pain and visual disturbance.  Respiratory: Positive for shortness of breath. Negative for cough and wheezing.   Cardiovascular: Positive for chest pain, palpitations and leg swelling (For the last few days).  Gastrointestinal: Positive for nausea. Negative for abdominal pain, blood in stool, diarrhea and vomiting.  Genitourinary: Negative for dysuria and hematuria.  Musculoskeletal: Negative for gait problem and joint swelling.  Skin: Negative for color change and rash.  Neurological: Negative for syncope and headaches.  Psychiatric/Behavioral: Negative for confusion and suicidal ideas.  All other systems reviewed and are negative.   Physical Exam Updated Vital Signs BP (!) 141/53   Pulse 70   Temp 98 F (36.7 C) (Oral)   Resp 15   Ht 5' (1.524 m)    Wt 73.5 kg   SpO2 96%   BMI 31.64 kg/m   Physical Exam Vitals and nursing note reviewed.  Constitutional:      General: She is not in acute distress.    Appearance: Normal appearance. She is well-developed and well-nourished. She is not ill-appearing or toxic-appearing.  HENT:     Head: Normocephalic and atraumatic.     Nose: Nose normal.     Mouth/Throat:     Mouth: Mucous membranes are moist.     Pharynx: Oropharynx is clear.  Eyes:     General: No scleral icterus.    Extraocular Movements: Extraocular movements intact.  Cardiovascular:     Rate and Rhythm: Regular rhythm. Tachycardia present.     Pulses: Normal pulses.     Heart sounds: No friction rub. No gallop.   Pulmonary:     Effort: Pulmonary effort is normal. No respiratory distress.     Breath sounds: Normal breath sounds. No wheezing, rhonchi or rales.  Abdominal:     Palpations: Abdomen is soft.     Tenderness: There is no abdominal tenderness. There is no guarding or rebound.  Musculoskeletal:        General: No edema.     Cervical back: Neck supple.     Comments: Trace bilateral nonpitting edema  Skin:    General: Skin is warm and dry.  Neurological:     Mental Status: She is alert.     Comments: Alert, grossly oriented, moves all extremities spontaneously, answering questions appropriately and following commands  Psychiatric:        Mood and Affect: Mood and affect normal.        Behavior: Behavior normal.     ED Results / Procedures / Treatments   Labs (all labs ordered are listed, but only abnormal results are displayed) Labs Reviewed  BASIC METABOLIC PANEL - Abnormal; Notable for the following components:      Result Value   Glucose, Bld 109 (*)    All other components within normal limits  CBC - Abnormal; Notable for the following components:   Hemoglobin 11.3 (*)    MCHC 29.7 (*)    All other components within normal limits  BRAIN NATRIURETIC PEPTIDE - Abnormal; Notable for the following  components:   B Natriuretic Peptide 123.4 (*)    All other components within normal limits  MAGNESIUM  TROPONIN I (  HIGH SENSITIVITY)  TROPONIN I (HIGH SENSITIVITY)    EKG EKG Interpretation  Date/Time:  Friday January 18 2020 19:38:00 EST Ventricular Rate:  79 PR Interval:  136 QRS Duration: 91 QT Interval:  411 QTC Calculation: 472 R Axis:   94 Text Interpretation: Sinus rhythm Prolonged PR interval Right axis deviation Minimal ST depression, diffuse leads Since prior ECG< rhythm is now sinus, rate has slowed Confirmed by Gareth Morgan [phone]) on 01/18/2020 9:08:01 PM   Radiology DG Chest Portable 1 View  Result Date: 01/18/2020 CLINICAL DATA:  74 year old female with shortness of breath and palpitation. EXAM: PORTABLE CHEST 1 VIEW COMPARISON:  Chest radiograph dated 03/26/2019. FINDINGS: No focal consolidation, pleural effusion or pneumothorax. Mild cardiomegaly. Mechanical cardiac valve and left atrial appendage occlusive device. No acute osseous pathology. IMPRESSION: No acute cardiopulmonary process. Electronically Signed   By: Anner Crete M.D.   On: 01/18/2020 20:05    Procedures Procedures (including critical care time)  Medications Ordered in ED Medications  adenosine (ADENOCARD) 6 MG/2ML injection 6 mg (6 mg Intravenous Given 01/18/20 1935)    ED Course  I have reviewed the triage vital signs and the nursing notes.  Pertinent labs & imaging results that were available during my care of the patient were reviewed by me and considered in my medical decision making (see chart for details).  Clinical Course as of 01/19/20 0013  Sat Jan 19, 2020  0012 Magnesium: 1.7 [LJ]    Clinical Course User Index [LJ] Rayna Sexton, PA-C   MDM Rules/Calculators/A&P                          74 y/o female with palpitations, chest discomfort, dyspnea. She is hemodynamically stable and breathing easily on room air. She appears comfortable but is having a steady,  narrow complex tachycardia at 136-137 on the monitor.  Differentials considered: I am most concerned for SVT vs atrial flutter. Pt may have electrolyte derangement, ACS, CHF exacerbation/volume overload, pleural effusion. ED provider interpretation of lab results: Unremarkable BMP. Stable anemia. Initial troponin 8. Repeat troponin and magnesium level pending at time of handoff to night team. BNP mildly elevated (123.4). ED provider interpretation of imaging results: Interpreted by radiology and images personally reviewed - CXR with mild cardiomegaly without evidence pneumonia, pulmonary edema, or pneumothorax - no acute findings. ED provider interpretation of ECG:   Supraventricular tachycardia with rate of 141. PR 136, QRS 80, QTc 496. There is no STEMI. RAD. Some ST depressions in inferior and lateral leads.  Repeat following adenosine shows sinus rhythm with a rate of 79. 1st degree AV block with QRS 91, QTc 472. ST depressions persist but are improved compared to earlier ECG. There is no STEMI.  MDM: Vagal maneuvers initially attempted without success. Adenosine 6mg  IV given with successful conversion to sinus rhythm in the 70s-80s. However, pt endorses feeling fatigued and somewhat worse following conversion. Will observe for serial troponins and order BNP, magnesium.  At time of handoff, pt remained in stable condition. Have communicated history, workup, and current plan to oncoming ED provider. Please see their documentation and final disposition. Anticipate likely discharge if troponin stable. She will likely require close outpatient follow up with cardiology to assess for need of updated echo or other further evaluation.  Final Clinical Impression(s) / ED Diagnoses Final diagnoses:  SVT (supraventricular tachycardia) (HCC)     Vanna Scotland, MD [id number]    Gareth Morgan, MD 01/20/20 1223

## 2020-01-19 LAB — MAGNESIUM: Magnesium: 1.7 mg/dL (ref 1.7–2.4)

## 2020-01-19 LAB — TROPONIN I (HIGH SENSITIVITY): Troponin I (High Sensitivity): 18 ng/L — ABNORMAL HIGH (ref <18)

## 2020-01-19 MED ORDER — MAGNESIUM SULFATE 2 GM/50ML IV SOLN
2.0000 g | Freq: Once | INTRAVENOUS | Status: AC
  Administered 2020-01-19: 01:00:00 2 g via INTRAVENOUS
  Filled 2020-01-19: qty 50

## 2020-01-19 NOTE — ED Notes (Signed)
Patient ambulated in room without assistance, no difficulty noted. NSR in 70s maintained with ambulation.

## 2020-01-21 ENCOUNTER — Telehealth: Payer: Self-pay | Admitting: Cardiology

## 2020-01-21 NOTE — Telephone Encounter (Signed)
New Message:    Pt said she was seen in [facility] ER on 01-18-20. She says she would like to talk to [facility], says she needs some guidance please.

## 2020-01-21 NOTE — Telephone Encounter (Signed)
Reports palpitations started a few days ago, "real quick ones". On 12/23 about 8pm she reports heart "flipping", HR 145 and then 2 minutes later it was gone. Then she experienced more christmas eve and this sent her to ED concerned. States HR usually in the 60s and she cannot tolerate it when it shoots into 100s. She did go to hospital as advised by our office and reports her Mg was "on the low end". She mentioned that she used to take Diltiazem (which she still has on hand at home) but this was stopped and she isn't sure when/why.  Researched chart and pt made aware it was stopped earlier this year, February, d/t fatigue and low HRs. She wonders if she could take this if she experiences more episodes, but she is also leary of restarting. She would like a plan until she can see the EP PA 1/12. Pt aware I will send to Dr. Elberta Fortis for advisement.  Aware that he may recommend Diltiazem 30 mg to take for infrequent episodes.  That with short lived episode she shouldn't need 120 mg dosing at this time. She is also being mindful of triggers, such as stress. Aware I will forward to Dr. Elberta Fortis for advisement and let her know. She is agreeable to plan.

## 2020-01-21 NOTE — Telephone Encounter (Signed)
See telephone note from today for further documentation

## 2020-01-22 MED ORDER — DILTIAZEM HCL 30 MG PO TABS: 30.0000 mg | ORAL_TABLET | Freq: Four times a day (QID) | ORAL | 1 refills | Status: DC | PRN

## 2020-01-22 NOTE — Telephone Encounter (Signed)
Ok to add 30 mg diltiazem PRN for rapid heart rates.

## 2020-01-22 NOTE — Telephone Encounter (Signed)
Pt doesn't feel good today, SOB and fingers are swollen. She is going to take Lasix 20 mg today to see if helps (discussed w/ Dr. Cornelius Moras & Duke Salvia per pt) Advised to take Diltiazem 30 mg Q6H for elevated HRs above 100, per Dr. Elberta Fortis. Moved appt w/ Renee to this Thursday (cancelled 1/12 appt) to further evaluate/follow up. Pt agreeable to plan and appreciates the help.  She also reports she has been working on BP control w/ Dr. Duke Salvia.  She wants SBP below 130s but pt reports it has been in the 140s recently.  DBP in 60s. Pt aware to continue following, bring in readings to appt Thursday.  Explained that recent increase could be secondary to stress from elevated HRs/AFib and can discuss further at appt this week. Pt agreeable to plan.

## 2020-01-24 ENCOUNTER — Ambulatory Visit (INDEPENDENT_AMBULATORY_CARE_PROVIDER_SITE_OTHER): Payer: Medicare Other | Admitting: Physician Assistant

## 2020-01-24 ENCOUNTER — Other Ambulatory Visit: Payer: Self-pay

## 2020-01-24 ENCOUNTER — Encounter: Payer: Self-pay | Admitting: Physician Assistant

## 2020-01-24 VITALS — BP 140/68 | HR 60 | Ht 60.0 in | Wt 162.0 lb

## 2020-01-24 DIAGNOSIS — I480 Paroxysmal atrial fibrillation: Secondary | ICD-10-CM

## 2020-01-24 DIAGNOSIS — Z952 Presence of prosthetic heart valve: Secondary | ICD-10-CM | POA: Diagnosis not present

## 2020-01-24 DIAGNOSIS — I471 Supraventricular tachycardia: Secondary | ICD-10-CM | POA: Diagnosis not present

## 2020-01-24 DIAGNOSIS — I10 Essential (primary) hypertension: Secondary | ICD-10-CM

## 2020-01-24 DIAGNOSIS — Z79899 Other long term (current) drug therapy: Secondary | ICD-10-CM | POA: Diagnosis not present

## 2020-01-24 MED ORDER — MAGNESIUM OXIDE 400 MG PO TABS: 400.0000 mg | ORAL_TABLET | Freq: Every day | ORAL | 1 refills | Status: DC

## 2020-01-24 NOTE — Patient Instructions (Addendum)
Medication Instructions:   START TAKING MAGNESIUM 400 MG ONCE A DAY   *If you need a refill on your cardiac medications before your next appointment, please call your pharmacy*   Lab Work:  MAG TODAY    If you have labs (blood work) drawn today and your tests are completely normal, you will receive your results only by: Marland Kitchen MyChart Message (if you have MyChart) OR . A paper copy in the mail If you have any lab test that is abnormal or we need to change your treatment, we will call you to review the results.   Testing/Procedures: NONE ORDERED  TODAY     Follow-Up: At [facility], you and your health needs are our priority.  As part of our continuing mission to provide you with exceptional heart care, we have created designated Provider Care Teams.  These Care Teams include your primary Cardiologist (physician) and Advanced Practice Providers (APPs -  Physician Assistants and Nurse Practitioners) who all work together to provide you with the care you need, when you need it.  We recommend signing up for the patient portal called "MyChart".  Sign up information is provided on this After Visit Summary.  MyChart is used to connect with patients for Virtual Visits (Telemedicine).  Patients are able to view lab/test results, encounter notes, upcoming appointments, etc.  Non-urgent messages can be sent to your provider as well.   To learn more about what you can do with MyChart, go to [email or web address].    Your next appointment:   3 month(s)  The format for your next appointment:   In Person  Provider:   You may see Will Jorja Loa, MD or one of the following Advanced Practice Providers on your designated Care Team:    Gypsy Balsam, NP  Francis Dowse, PA-C  Casimiro Needle "Otilio Saber, [place]    Other Instructions

## 2020-01-24 NOTE — Progress Notes (Signed)
Cardiology Office Note Date:  01/24/2020  Patient ID:  Mary, Farrell [birth year], MRN [id number] PCP:  Daisy Floro, MD  Cardiologist:  Dr. Duke Salvia Electrophysiologist: Dr. Elberta Fortis    Chief Complaint: post ER visit  History of Present Illness: Mary Farrell is a 74 y.o. 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 Elberta Fortis, last saw him Feb 2021, at that time noted some dizziness, bradycardia and her dilt was stopped.  She saw Dr. Duke Salvia Aug 2021 with random intermittent episodes of SOB, CP, symptoms not felt to be cardiac given normal coronaries prior to MVR, suspected MS or GI, her omeprazole was increased.  She had an ER visit 01/18/20 with palpitations, found in an SVT, vagal manevers were unsuccessful adenosine given, notes report no flutter waves noted with CV to SR, though despite SR  Continued to feel unwell, and was observed,  HS Trop 8 > 18, discussed with cardiology and felt reasonable to discharge, she was given mag replacement Discharged feeling improved after some hours of observation K+ 4.5 Mag 1.7 BUN/Creat 11.0.79 BNP 123 WBC 6.0 H/H 11/38 Plts 191  Dr. Elberta Fortis recommended diltiazem 30mg  PRN   TODAY She is accompanied by her husband. She says this is the 1st episode of SVT in a year and has been doing quite well until then The day orior she had a brief episode of tachycardia that was self limited, was sitting down, leaned over to do something and set it off. The following day she was just walking and it started, though lasted for hours and went t the ER. No CP, longer it lasted perhaps a little winded with it No near syncope or syncope.  Since the ER she has not had any more, though the following couple days felt SOB and was instructed to take double lasix, and this helped. She mentions that her  pharmacist told her she should not take lasix with tikosyn so she was scared. Somewhere along the way someone also told her to stop her Mag ox and was confused because she need mag at the ER.  She was very disappointed because she had been feeling and doing so well for the last year.   No bleeding or signs of bleeding    AAD Amiodarone not tolerated 2/2  Flecainide 2017 >> failed and had PR prolongation PVI ablation 03/10/2017 CTI/PVI Ablation May 2019 MAZE 2020 Tikosyn Jan 2021  Past Medical History:  Diagnosis Date   Anxiety 06/29/2012   Aortic regurgitation 08/13/2015   Arthritis    "hands, wrists, back, feet, toes" (06/24/2017)   Atrial flutter (HCC) 09/18/2015   Atypical chest pain 05/13/2016   Chest pain    remote cath in 1996 with NORMAL coronaries noted   CHF (congestive heart failure) (HCC)    Dyspnea 10/30/2010   Essential hypertension 09/05/2013   Exogenous obesity    history of    GERD (gastroesophageal reflux disease)    Glaucoma, both eyes    Headache, migraine    "stopped in my 50's" (09/18/2015)   History of cardiovascular stress test 2007   showing no ischemia   Hypertension    Mitral stenosis and aortic insufficiency 06/23/2010   Mitral stenosis with insufficiency    Mitral stenosis with regurgitation    Murmur    of mitral stenosis and moderate aortic insufficiency  Nausea 05/01/2018   OSA on CPAP    Palpitations    occasional   Paroxysmal A-fib (HCC) 06/24/2017   Pericardial effusion 09/18/2015   Persistent atrial fibrillation (HCC) 03/10/2017   Personal history of rheumatic heart disease    S/P minimally-invasive maze operation for atrial fibrillation 12/13/2018   Complete bilateral atrial lesion set using cryothermy and bipolar radiofrequency ablation with clipping of LA appendage via right mini-thoracotomy approach   S/P minimally-invasive mitral valve replacement with bioprosthetic valve 12/13/2018   31 mm [facility]  Mitral stented bovine pericardial tissue valve   SBE (subacute bacterial endocarditis)    prophylaxis, patient unaware   Sliding hiatal hernia    Vertigo 05/01/2018    Past Surgical History:  Procedure Laterality Date   ABDOMINAL HYSTERECTOMY  1975   APPENDECTOMY  1977   ATRIAL FIBRILLATION ABLATION N/A 03/10/2017   Procedure: ATRIAL FIBRILLATION ABLATION;  Surgeon: Regan Lemming, MD;  Location: [facility] INVASIVE CV LAB;  Service: Cardiovascular;  Laterality: N/A;   ATRIAL FIBRILLATION ABLATION  06/24/2017   ATRIAL FIBRILLATION ABLATION N/A 06/24/2017   Procedure: ATRIAL FIBRILLATION ABLATION;  Surgeon: Regan Lemming, MD;  Location: [facility] INVASIVE CV LAB;  Service: Cardiovascular;  Laterality: N/A;   BUNIONECTOMY Right 2000s   CARDIAC CATHETERIZATION  01/13/1995   normal coronary anatomy and mild mitral stenosis and mild pulmonary hypertension   CARDIOVERSION N/A 09/22/2015   Procedure: CARDIOVERSION;  Surgeon: Jake Bathe, MD;  Location: [facility] ENDOSCOPY;  Service: Cardiovascular;  Laterality: N/A;   CARDIOVERSION N/A 10/25/2016   Procedure: CARDIOVERSION;  Surgeon: Pricilla Riffle, MD;  Location: [facility] ENDOSCOPY;  Service: Cardiovascular;  Laterality: N/A;   CARDIOVERSION N/A 04/15/2017   Procedure: CARDIOVERSION;  Surgeon: Wendall Stade, MD;  Location: [facility] ENDOSCOPY;  Service: Cardiovascular;  Laterality: N/A;   CARDIOVERSION N/A 05/16/2017   Procedure: CARDIOVERSION;  Surgeon: Chrystie Nose, MD;  Location: [facility] ENDOSCOPY;  Service: Cardiovascular;  Laterality: N/A;   CARDIOVERSION N/A 01/24/2019   Procedure: CARDIOVERSION;  Surgeon: Chrystie Nose, MD;  Location: [facility] ENDOSCOPY;  Service: Cardiovascular;  Laterality: N/A;   CATARACT EXTRACTION W/ INTRAOCULAR LENS  IMPLANT, BILATERAL Bilateral 2013   COLONOSCOPY  2013   EYE SURGERY Bilateral    cataracts   LAPAROSCOPIC CHOLECYSTECTOMY  1997   MINIMALLY INVASIVE MAZE PROCEDURE N/A 12/13/2018   Procedure: MINIMALLY  INVASIVE MAZE PROCEDURE using a 45 MM AtriClip.;  Surgeon: Purcell Nails, MD;  Location: [facility] OR;  Service: Open Heart Surgery;  Laterality: N/A;   MITRAL VALVE REPLACEMENT Right 12/13/2018   Procedure: MINIMALLY INVASIVE MITRAL VALVE (MV) REPLACEMENT using a Magna Mitral Ease 31 MM Valve.;  Surgeon: Purcell Nails, MD;  Location: [facility] OR;  Service: Open Heart Surgery;  Laterality: Right;   RIGHT/LEFT HEART CATH AND CORONARY ANGIOGRAPHY N/A 10/18/2018   Procedure: RIGHT/LEFT HEART CATH AND CORONARY ANGIOGRAPHY;  Surgeon: Marykay Lex, MD;  Location: [facility] INVASIVE CV LAB;  Service: Cardiovascular;  Laterality: N/A;   TEE WITHOUT CARDIOVERSION N/A 06/23/2017   Procedure: TRANSESOPHAGEAL ECHOCARDIOGRAM (TEE);  Surgeon: Pricilla Riffle, MD;  Location: [facility] ENDOSCOPY;  Service: Cardiovascular;  Laterality: N/A;   TEE WITHOUT CARDIOVERSION N/A 10/17/2018   Procedure: TRANSESOPHAGEAL ECHOCARDIOGRAM (TEE);  Surgeon: Jake Bathe, MD;  Location: [facility] ENDOSCOPY;  Service: Cardiovascular;  Laterality: N/A;   TEE WITHOUT CARDIOVERSION N/A 12/13/2018   Procedure: TRANSESOPHAGEAL ECHOCARDIOGRAM (TEE);  Surgeon: Purcell Nails, MD;  Location: [facility] OR;  Service: Open Heart Surgery;  Laterality: N/A;  TONSILLECTOMY AND ADENOIDECTOMY  1990s   UVULOPALATOPHARYNGOPLASTY, TONSILLECTOMY AND SEPTOPLASTY  1990s    Current Outpatient Medications  Medication Sig Dispense Refill   acetaminophen (TYLENOL) 500 MG tablet Take 1,000 mg by mouth every 6 (six) hours as needed for moderate pain or headache.      ALPRAZolam (XANAX) 0.25 MG tablet Take 0.125 mg by mouth 2 (two) times daily as needed for anxiety.      Calcium Citrate-Vitamin D (CALCIUM CITRATE + D3) 200-250 MG-UNIT TABS Take 1 tablet by mouth daily with supper.     carvedilol (COREG) 12.5 MG tablet TAKE 1 TABLET BY MOUTH 2 (TWO) TIMES DAILY WITH A MEAL. NEED OFFICE VISIT FOR FUTURE REFILL (Patient taking differently: Take 12.5 mg by mouth 2 (two) times daily  with a meal.) 180 tablet 3   cetirizine (ZYRTEC) 10 MG tablet Take 10 mg by mouth at bedtime.      Cholecalciferol (VITAMIN D) 2000 UNITS CAPS Take 2,000 Units by mouth daily.     conjugated estrogens (PREMARIN) vaginal cream Place 1 Applicatorful vaginally daily as needed (irritation).      Cyanocobalamin (VITAMIN B12) 1000 MCG TBCR Take 1,000 mcg by mouth daily.     diltiazem (CARDIZEM) 30 MG tablet Take 1 tablet (30 mg total) by mouth 4 (four) times daily as needed (for heart rates greater than 100). 30 tablet 1   dofetilide (TIKOSYN) 500 MCG capsule TAKE 1 CAPSULE (500 MCG TOTAL) BY MOUTH 2 (TWO) TIMES DAILY. 180 capsule 3   dorzolamide-timolol (COSOPT) 22.3-6.8 MG/ML ophthalmic solution Place 1 drop into both eyes daily.     ELIQUIS 5 MG TABS tablet TAKE 1 TABLET BY MOUTH  TWICE DAILY (Patient taking differently: Take 5 mg by mouth 2 (two) times daily.) 180 tablet 1   famotidine (PEPCID) 20 MG tablet Take 20 mg by mouth at bedtime.     fluticasone (FLONASE) 50 MCG/ACT nasal spray Place 1 spray into both nostrils daily as needed for allergies.     furosemide (LASIX) 40 MG tablet Take 20 mg by mouth daily as needed for fluid.     gabapentin (NEURONTIN) 300 MG capsule Take 300 mg by mouth 3 (three) times daily.      lactobacillus acidophilus (BACID) TABS tablet Take 1 tablet by mouth daily.      latanoprost (XALATAN) 0.005 % ophthalmic solution Place 1 drop into both eyes at bedtime.   6   Multiple Vitamin (MULTIVITAMIN WITH MINERALS) TABS tablet Take 1 tablet by mouth daily.     NON FORMULARY Apply 1 application topically 2 (two) times daily as needed (for eczema). Traimcinolone/[employer] Moist Cream     omeprazole (PRILOSEC) 40 MG capsule Take 40 mg by mouth daily.     oxybutynin (DITROPAN-XL) 10 MG 24 hr tablet Take 10 mg by mouth daily.     polyethylene glycol (MIRALAX / GLYCOLAX) 17 g packet Take 17 g by mouth daily as needed for moderate constipation.      potassium chloride  SA (KLOR-CON) 20 MEQ tablet Take 20 mEq by mouth See admin instructions. Take as needed with furosemide     PRESCRIPTION MEDICATION Inhale into the lungs at bedtime. CPAP     valsartan (DIOVAN) 320 MG tablet Take 1 tablet (320 mg total) by mouth daily. 90 tablet 1   No current facility-administered medications for this visit.    Allergies:   Tizanidine, Amoxicillin, Metoprolol tartrate, and Oxaprozin   Social History:  The patient  reports that she has never  smoked. She has never used smokeless tobacco. She reports current alcohol use. She reports that she does not use drugs.   Family History:  The patient's family history includes Diabetes in her brother; Heart attack in her mother; Heart failure in her maternal grandfather; Hypertension in her brother; Kidney disease in her brother.  ROS:  Please see the history of present illness.    All other systems are reviewed and otherwise negative.   PHYSICAL EXAM:  VS:  There were no vitals taken for this visit. BMI: There is no height or weight on file to calculate BMI. Well nourished, well developed, in no acute distress HEENT: normocephalic, atraumatic Neck: no JVD, carotid bruits or masses Cardiac:  RRR; no significant murmurs, no rubs, or gallops Lungs:   CTA b/l, no wheezing, rhonchi or rales Abd: soft, nontender MS: no deformity or atrophy Ext: no edema Skin: warm and dry, no rash Neuro:  No gross deficits appreciated Psych: euthymic mood, full affect   EKG:  Done today and reviewed by myself shows  SB 56bpm, QT manually measured is 467ms, QTc 466ms  ER EKG is reviewed, appears an AFlutter 141    TTE 01/23/19: 1. Left ventricular ejection fraction, by visual estimation, is 55 to  60%. The left ventricle has normal function. Left ventricular septal wall  thickness was mildly increased. Moderately increased left ventricular  posterior wall thickness. There is  mildly increased left ventricular hypertrophy.  2. Left  ventricular diastolic parameters are indeterminate.  3. The left ventricle demonstrates regional wall motion abnormalities.  4. Elevated LVEDP. Hypokinesis of the basal septum consistent with  post-operative state.  5. Global right ventricle has normal systolic function.The right  ventricular size is normal. No increase in right ventricular wall  thickness.  6. Left atrial size was severely dilated.  7. Right atrial size was normal.  8. The mitral valve has been repaired/replaced. No evidence of mitral  valve regurgitation. No evidence of mitral stenosis.  9. The tricuspid valve is normal in structure.  10. The aortic valve is tricuspid. Aortic valve regurgitation is mild. No  evidence of aortic valve sclerosis or stenosis.  11. The pulmonic valve was normal in structure. Pulmonic valve  regurgitation is not visualized.  12. Normal pulmonary artery systolic pressure.  13. The inferior vena cava is normal in size with <50% respiratory  variability, suggesting right atrial pressure of 8 mmHg.     10/18/2018: LHC  Hemodynamic findings consistent with mild pulmonary hypertension.  LV end diastolic pressure is normal.  There is severe mitral valve stenosis -suggested by a echocardiogram, large PCWP V waves along with elevated PCWP but normal LVEDP would also suggest this.  Angiographically normal coronary arteries   SUMMARY  Angiographically normal coronary arteries  Mild pulmonary hypertension with mildly elevated PCWP but normal LVEDP suggestive of mitral stenosis  Significant V wave noted on PCWP waveform suggesting mitral stenosis.  RECOMMENDATIONS  Return to nursing unit for ongoing care.    Proceed with plans for mitral valve replacement   06/24/2017 EPS/ablation CONCLUSIONS: 1. Sinus rhythm upon presentation.   2. Successful electrical isolation and anatomical encircling of all four pulmonary veins with radiofrequency current.    3. Cavo-tricuspid isthmus  ablation was performed with complete bidirectional isthmus block achieved.  4. No inducible arrhythmias following ablation both on and off of dobutamine 5. No early apparent complications.   03/10/2017: EPS/ablation CONCLUSIONS: 1. Atrial fibrillation upon presentation.   2. Successful electrical isolation and anatomical encircling of  all four pulmonary veins with radiofrequency current.  A WACA approach was used 3. Additional left atrial ablation was performed with a left atrial roof line  4. Atrial fibrillation successfully cardioverted to sinus rhythm. 5. No early apparent complications.     Recent Labs: 01/28/2019: ALT 13 01/18/2020: B Natriuretic Peptide 123.4; BUN 11; Creatinine, Ser 0.79; Hemoglobin 11.3; Magnesium 1.7; Platelets 191; Potassium 4.5; Sodium 140  No results found for requested labs within last 8760 hours.   Estimated Creatinine Clearance: 53.6 mL/min (by C-G formula based on SCr of 0.79 mg/dL).   Wt Readings from Last 3 Encounters:  01/18/20 162 lb (73.5 kg)  12/31/19 165 lb (74.8 kg)  11/28/19 157 lb 6.4 oz (71.4 kg)     Other studies reviewed: Additional studies/records reviewed today include: summarized above  ASSESSMENT AND PLAN:  1. Persistent AFib     CHA2DS2Vasc is 3, on Eliquis, appropriately dose     On Tikosyn, stable QTc      Recheck mag, resume Mag ox 400mg  daily      Assured her she can use her PRN laski with Tikosyn, she knows to take her K+ when using the Tikosyn.  2. SVT     EKG looked like AFlutter though ER note mentions no noted flutter waves with adenosine, tracing is unavailable      Baseline SB with her coreg     Discussed treatment strategies, she is most comfortable with the PRN diltiazem rather then consideration of another ablation just yet   3. MVR (bioprosthetic)     Stable by last echo 2020     C/w Dr. Oval Linsey  4. HTN     She mentions Dr. Oval Linsey would like her SBP 130 or less but worried that her DBP will get to  low, her home readings 130's-140's/50's60's      I will defer to Dr. Oval Linsey    Disposition: F/u with in a few months, sooner if needed  Current medicines are reviewed at length with the patient today.  The patient did not have any concerns regarding medicines.  Venetia Night, PA-C 01/24/2020 5:02 AM     [facility] [address] [place]  [place] [address] [phone] (office)  [phone] (fax)

## 2020-01-25 LAB — MAGNESIUM: Magnesium: 2.2 mg/dL (ref 1.6–2.3)

## 2020-01-30 ENCOUNTER — Other Ambulatory Visit: Payer: Self-pay | Admitting: *Deleted

## 2020-01-30 DIAGNOSIS — Z79899 Other long term (current) drug therapy: Secondary | ICD-10-CM

## 2020-01-30 MED ORDER — MAGNESIUM OXIDE 400 MG PO TABS: 200.0000 mg | ORAL_TABLET | Freq: Every day | ORAL | 1 refills | Status: DC

## 2020-01-30 NOTE — Progress Notes (Signed)
Mag level ordered

## 2020-02-06 ENCOUNTER — Ambulatory Visit: Payer: Medicare Other | Admitting: Physician Assistant

## 2020-02-24 ENCOUNTER — Other Ambulatory Visit: Payer: Self-pay | Admitting: Cardiovascular Disease

## 2020-03-12 ENCOUNTER — Encounter: Payer: Self-pay | Admitting: Cardiovascular Disease

## 2020-03-12 ENCOUNTER — Ambulatory Visit (INDEPENDENT_AMBULATORY_CARE_PROVIDER_SITE_OTHER): Payer: Medicare Other | Admitting: Cardiovascular Disease

## 2020-03-12 ENCOUNTER — Other Ambulatory Visit: Payer: Self-pay

## 2020-03-12 VITALS — BP 156/62 | HR 64 | Ht 60.0 in | Wt 169.2 lb

## 2020-03-12 DIAGNOSIS — I4819 Other persistent atrial fibrillation: Secondary | ICD-10-CM

## 2020-03-12 DIAGNOSIS — I471 Supraventricular tachycardia: Secondary | ICD-10-CM

## 2020-03-12 DIAGNOSIS — I10 Essential (primary) hypertension: Secondary | ICD-10-CM

## 2020-03-12 DIAGNOSIS — Z8679 Personal history of other diseases of the circulatory system: Secondary | ICD-10-CM

## 2020-03-12 DIAGNOSIS — Z9889 Other specified postprocedural states: Secondary | ICD-10-CM

## 2020-03-12 MED ORDER — HYDRALAZINE HCL 25 MG PO TABS: ORAL_TABLET | ORAL | 5 refills | Status: DC

## 2020-03-12 NOTE — Patient Instructions (Signed)
Medication Instructions:  START HYDRALAZINE 25 MG AS NEEDED FOR BLOOD PRESSURE ABOVE 140   *If you need a refill on your cardiac medications before your next appointment, please call your pharmacy*   Lab Work: NONE If you have labs (blood work) drawn today and your tests are completely normal, you will receive your results only by: Marland Kitchen MyChart Message (if you have MyChart) OR . A paper copy in the mail If you have any lab test that is abnormal or we need to change your treatment, we will call you to review the results.   Testing/Procedures: NONE   Follow-Up: At [facility], you and your health needs are our priority.  As part of our continuing mission to provide you with exceptional heart care, we have created designated Provider Care Teams.  These Care Teams include your primary Cardiologist (physician) and Advanced Practice Providers (APPs -  Physician Assistants and Nurse Practitioners) who all work together to provide you with the care you need, when you need it.  We recommend signing up for the patient portal called "MyChart".  Sign up information is provided on this After Visit Summary.  MyChart is used to connect with patients for Virtual Visits (Telemedicine).  Patients are able to view lab/test results, encounter notes, upcoming appointments, etc.  Non-urgent messages can be sent to your provider as well.   To learn more about what you can do with MyChart, go to [email or web address].    Your next appointment:   2 month(s)  The format for your next appointment:   In Person  Provider:   You may see Skeet Latch, MD or one of the following Advanced Practice Providers on your designated Care Team:    Kerin Ransom, PA-C  [place], [place]  Coletta Memos, [place]

## 2020-03-12 NOTE — Progress Notes (Signed)
Cardiology Office Note    Date:  03/12/2020   ID:  [name], [name] 1945/04/14, MRN [id number]  Patient Location: Home Provider Location: Office/Clinic  PCP:  Lawerance Cruel, MD  Cardiologist:  Skeet Latch, MD  Electrophysiologist:  [name], MD   Evaluation Performed:  Follow-Up Visit  Chief Complaint:  hyertension  History of Present Illness:     The patient does not have symptoms concerning for COVID-19 infection (fever, chills, cough, or new shortness of breath).    History of Present Illness: Mary Farrell is a 75 y.o. female with moderate aortic regurgitation, Rheumatic mitral stenosis s/p MVR, chronic pericardial effusion, paroxysmal atrial flutter s/p ablation 05/2017, MAZE 11/2018 and hypertension who presents for follow up.  Mary Farrell was previously a patient of Mary Farrell.  She followed up with Mary Farrell on 04/2015 due to shortness of breath that she felt was getting worse.  She was referred for an echo 04/2015 that revealed LVEF 55-60% with moderate AR and mild-moderate MS.  There was also a moderate pericardial effusion but no evidence of tamponade.  She had a heart cath 12/1994 that revealed normal coronaries with mild MS and mild pulmonary hypertension.  She also had a Cardiolite in 2007 that was negative for ischemia.  She was seen in clinic 07/2015 and reported persistent shortness of breath with minimal exertion and chest discomfort.  She was referred for exercise Myoview 08/26/15 that revealed LVEF 60% and no perfusion defects.    Mary Farrell was admitted 08/2015 with atrial flutter.  She had an echo that showed a persistent moderate-severe pericardial effusion but no tamponade.  She was started on amiodarone, which she did not tolerate due to nausea.  She underwent DCCV on 09/22/15 and then was started on flecainide.  She continued to report shortness of breath and was again referred for an echo that revealed LVEF 60-65% with grade 2 diastolic  dysfunction, mild aortic regurgitation, and moderate mitral stenosis. She had a moderate pericardial effusion localized to the inferior and inferolateral walls. There was no evidence of tamponade.  Mary Farrell had an outpatient DCCV on 10/1 and converted with one shock at Mary Farrell.  She developed recurrent atrial fibrillation and was referred to EP.  She underwent afib/flutter ablation with Dr. Curt Bears on 06/24/17. She was started on flecainide but had recurent disease and was started on Tikosyn.  Mary mitral valve was replaced 11/2018 (31 mm [facility] bioprosthetic).  She had a MAZE at that time. She later was Dr. Curt Bears and was in a junctional rhythm so diltiazem was discontinued.  She had recurrent episode of atrial fibrillation and took a dose of diltiazem she subsequently converted back to sinus rhythm.  Mary Farrell blood pressure was elevated so she switched from losartan to valsartan.  She also had chest pain that was thought to be atypical, especially in setting of normal coronaries prior to Mary surgery.  It was thought that Mary symptoms may be related to GERD and Mary PPI was increased.  At Mary last appointment Mary Farrell blood pressure was a little elevated.  Furosemide was switched to daily.  There was consideration for adding doxazosin for blood pressure being fully controlled.  She notes that she isn't feeling well.  Two hours ago she started feeling lightheaded and nauseous.  When she saw Mary Farrell last she was feeling well.  On 12/23, the anniversary of Mary brother's death, Mary HR was in the 140s. She has been to the  ED 12/2019 where she was found to be in SVT not responsive to vagal maneuvers or adenosine.  High-sensitivity troponin was minimally elevated from 8-18.  She followed up with EP on 12/30 and was in atrial flutter.  She was not interested in ablation and it was recommended that she take diltiazem as needed.  Mary BP at home has been in the 110-130s/40-60s.  She has been under a lot of  stress lately.  Mary Farrell is bipolar and not getting along with their younger Farrell.  Mary Farrell was also diagnosed with atrial fibrillation and she worries about him.     Past Medical History:  Diagnosis Date  . Anxiety 06/29/2012  . Aortic regurgitation 08/13/2015  . Arthritis    "hands, wrists, back, feet, toes" (06/24/2017)  . Atrial flutter ([place]) 09/18/2015  . Atypical chest pain 05/13/2016  . Chest pain    remote cath in 1996 with NORMAL coronaries noted  . CHF (congestive heart failure) ([place])   . Dyspnea 10/30/2010  . Essential hypertension 09/05/2013  . Exogenous obesity    history of   . GERD (gastroesophageal reflux disease)   . Glaucoma, both eyes   . Headache, migraine    "stopped in my 50's" (09/18/2015)  . History of cardiovascular stress test 2007   showing no ischemia  . Hypertension   . Mitral stenosis and aortic insufficiency 06/23/2010  . Mitral stenosis with insufficiency   . Mitral stenosis with regurgitation   . Murmur    of mitral stenosis and moderate aortic insufficiency      . Nausea 05/01/2018  . OSA on CPAP   . Palpitations    occasional  . Paroxysmal A-fib ([place]) 06/24/2017  . Pericardial effusion 09/18/2015  . Persistent atrial fibrillation ([place]) 03/10/2017  . Personal history of rheumatic heart disease   . S/P minimally-invasive maze operation for atrial fibrillation 12/13/2018   Complete bilateral atrial lesion set using cryothermy and bipolar radiofrequency ablation with clipping of LA appendage via right mini-thoracotomy approach  . S/P minimally-invasive mitral valve replacement with bioprosthetic valve 12/13/2018   31 mm [facility] Mitral stented bovine pericardial tissue valve  . SBE (subacute bacterial endocarditis)    prophylaxis, patient unaware  . Sliding hiatal hernia   . Vertigo 05/01/2018    Past Surgical History:  Procedure Laterality Date  . ABDOMINAL HYSTERECTOMY  1975  . APPENDECTOMY  1977  . ATRIAL FIBRILLATION ABLATION N/A  03/10/2017   Procedure: ATRIAL FIBRILLATION ABLATION;  Surgeon: [name], MD;  Location: [place] CV LAB;  Service: Cardiovascular;  Laterality: N/A;  . ATRIAL FIBRILLATION ABLATION  06/24/2017  . ATRIAL FIBRILLATION ABLATION N/A 06/24/2017   Procedure: ATRIAL FIBRILLATION ABLATION;  Surgeon: [name], MD;  Location: [place] CV LAB;  Service: Cardiovascular;  Laterality: N/A;  . BUNIONECTOMY Right 2000s  . CARDIAC CATHETERIZATION  01/13/1995   normal coronary anatomy and mild mitral stenosis and mild pulmonary hypertension  . CARDIOVERSION N/A 09/22/2015   Procedure: CARDIOVERSION;  Surgeon: Jerline Pain, MD;  Location: [facility] ENDOSCOPY;  Service: Cardiovascular;  Laterality: N/A;  . CARDIOVERSION N/A 10/25/2016   Procedure: CARDIOVERSION;  Surgeon: Fay Records, MD;  Location: [facility] ENDOSCOPY;  Service: Cardiovascular;  Laterality: N/A;  . CARDIOVERSION N/A 04/15/2017   Procedure: CARDIOVERSION;  Surgeon: Josue Hector, MD;  Location: [facility] ENDOSCOPY;  Service: Cardiovascular;  Laterality: N/A;  . CARDIOVERSION N/A 05/16/2017   Procedure: CARDIOVERSION;  Surgeon: Pixie Casino, MD;  Location: [facility] ENDOSCOPY;  Service: Cardiovascular;  Laterality: N/A;  . CARDIOVERSION N/A 01/24/2019   Procedure: CARDIOVERSION;  Surgeon: Pixie Casino, MD;  Location: [facility] ENDOSCOPY;  Service: Cardiovascular;  Laterality: N/A;  . CATARACT EXTRACTION W/ INTRAOCULAR LENS  IMPLANT, BILATERAL Bilateral 2013  . COLONOSCOPY  2013  . EYE SURGERY Bilateral    cataracts  . LAPAROSCOPIC CHOLECYSTECTOMY  1997  . MINIMALLY INVASIVE MAZE PROCEDURE N/A 12/13/2018   Procedure: MINIMALLY INVASIVE MAZE PROCEDURE using a 45 MM AtriClip.;  Surgeon: Rexene Alberts, MD;  Location: [place];  Service: Open Heart Surgery;  Laterality: N/A;  . MITRAL VALVE REPLACEMENT Right 12/13/2018   Procedure: MINIMALLY INVASIVE MITRAL VALVE (MV) REPLACEMENT using a Magna Mitral Ease 31 MM Valve.;  Surgeon: Rexene Alberts,  MD;  Location: [place];  Service: Open Heart Surgery;  Laterality: Right;  . RIGHT/LEFT HEART CATH AND CORONARY ANGIOGRAPHY N/A 10/18/2018   Procedure: RIGHT/LEFT HEART CATH AND CORONARY ANGIOGRAPHY;  Surgeon: Leonie Man, MD;  Location: [place] CV LAB;  Service: Cardiovascular;  Laterality: N/A;  . TEE WITHOUT CARDIOVERSION N/A 06/23/2017   Procedure: TRANSESOPHAGEAL ECHOCARDIOGRAM (TEE);  Surgeon: Fay Records, MD;  Location: [place];  Service: Cardiovascular;  Laterality: N/A;  . TEE WITHOUT CARDIOVERSION N/A 10/17/2018   Procedure: TRANSESOPHAGEAL ECHOCARDIOGRAM (TEE);  Surgeon: Jerline Pain, MD;  Location: [facility] ENDOSCOPY;  Service: Cardiovascular;  Laterality: N/A;  . TEE WITHOUT CARDIOVERSION N/A 12/13/2018   Procedure: TRANSESOPHAGEAL ECHOCARDIOGRAM (TEE);  Surgeon: Rexene Alberts, MD;  Location: [place];  Service: Open Heart Surgery;  Laterality: N/A;  . TONSILLECTOMY AND ADENOIDECTOMY  1990s  . UVULOPALATOPHARYNGOPLASTY, TONSILLECTOMY AND SEPTOPLASTY  1990s     Current Outpatient Medications  Medication Sig Dispense Refill  . acetaminophen (TYLENOL) 500 MG tablet Take 1,000 mg by mouth every 6 (six) hours as needed for moderate pain or headache.     . ALPRAZolam (XANAX) 0.25 MG tablet Take 0.125 mg by mouth 2 (two) times daily as needed for anxiety.     . Calcium Citrate-Vitamin D (CALCIUM CITRATE + D3) 200-250 MG-UNIT TABS Take 1 tablet by mouth daily with supper.    . carvedilol (COREG) 12.5 MG tablet TAKE 1 TABLET BY MOUTH 2 (TWO) TIMES DAILY WITH A MEAL. NEED OFFICE VISIT FOR FUTURE REFILL 180 tablet 3  . cetirizine (ZYRTEC) 10 MG tablet Take 10 mg by mouth at bedtime.     . Cholecalciferol (VITAMIN D) 2000 UNITS CAPS Take 2,000 Units by mouth daily.    Marland Kitchen conjugated estrogens (PREMARIN) vaginal cream Place 1 Applicatorful vaginally daily as needed (irritation).     . Cyanocobalamin (VITAMIN B12) 1000 MCG TBCR Take 1,000 mcg by mouth daily.    Marland Kitchen diltiazem (CARDIZEM) 30  MG tablet Take 1 tablet (30 mg total) by mouth 4 (four) times daily as needed (for heart rates greater than 100). 30 tablet 1  . dofetilide (TIKOSYN) 500 MCG capsule TAKE 1 CAPSULE (500 MCG TOTAL) BY MOUTH 2 (TWO) TIMES DAILY. 180 capsule 3  . dorzolamide-timolol (COSOPT) 22.3-6.8 MG/ML ophthalmic solution Place 1 drop into both eyes daily.    Marland Kitchen ELIQUIS 5 MG TABS tablet TAKE 1 TABLET BY MOUTH  TWICE DAILY 180 tablet 1  . famotidine (PEPCID) 20 MG tablet Take 20 mg by mouth at bedtime.    . fluticasone (FLONASE) 50 MCG/ACT nasal spray Place 1 spray into both nostrils daily as needed for allergies.    . furosemide (LASIX) 40 MG tablet Take 20 mg by mouth daily  as needed for fluid.    Marland Kitchen gabapentin (NEURONTIN) 300 MG capsule Take 300 mg by mouth 3 (three) times daily.     . hydrALAZINE (APRESOLINE) 25 MG tablet TAKE 1 TABLET DAILY AS NEEDED FOR BLOOD PRESSURE ABOVE 140 30 tablet 5  . latanoprost (XALATAN) 0.005 % ophthalmic solution Place 1 drop into both eyes at bedtime.   6  . magnesium oxide (MAG-OX) 400 MG tablet Take 0.5 tablets (200 mg total) by mouth daily. 90 tablet 1  . Multiple Vitamin (MULTIVITAMIN WITH MINERALS) TABS tablet Take 1 tablet by mouth daily.    . NON FORMULARY Apply 1 application topically 2 (two) times daily as needed (for eczema). Traimcinolone/[employer] Moist Cream    . omeprazole (PRILOSEC) 40 MG capsule Take 40 mg by mouth daily.    Marland Kitchen oxybutynin (DITROPAN-XL) 10 MG 24 hr tablet Take 10 mg by mouth daily.    . polyethylene glycol (MIRALAX / GLYCOLAX) 17 g packet Take 17 g by mouth daily as needed for moderate constipation.     . potassium chloride SA (KLOR-CON) 20 MEQ tablet Take 20 mEq by mouth See admin instructions. Take as needed with furosemide    . PRESCRIPTION MEDICATION Inhale into the lungs at bedtime. CPAP    . valsartan (DIOVAN) 320 MG tablet TAKE 1 TABLET BY MOUTH EVERY DAY 90 tablet 1  . lactobacillus acidophilus (BACID) TABS tablet Take 1 tablet by mouth daily.       No current facility-administered medications for this visit.    Allergies:   Tizanidine, Amoxicillin, Metoprolol tartrate, and Oxaprozin    Social History:  The patient  reports that she has never smoked. She has never used smokeless tobacco. She reports current alcohol use. She reports that she does not use drugs.   Family History:  The patient's  family history includes Diabetes in Mary brother; Heart attack in Mary mother; Heart failure in Mary maternal grandfather; Hypertension in Mary brother; Kidney disease in Mary brother.    ROS:  Please see the history of present illness.   Otherwise, review of systems are positive for none.   All other systems are reviewed and negative.    PHYSICAL EXAM: VS:  BP (!) 156/62   Pulse 64   Ht 5' (1.524 m)   Wt 169 lb 3.2 oz (76.7 kg)   SpO2 98%   BMI 33.04 kg/m  , BMI Body mass index is 33.04 kg/m. GENERAL:  Well appearing HEENT: Pupils equal round and reactive, fundi not visualized, oral mucosa unremarkable NECK:  No jugular venous distention, waveform within normal limits, carotid upstroke brisk and symmetric, no bruits LUNGS:  Clear to auscultation bilaterally HEART:  RRR.  PMI not displaced or sustained,S1 and S2 within normal limits, no S3, no S4, no clicks, no rubs, II/VI  murmur ABD:  Flat, positive bowel sounds normal in frequency in pitch, no bruits, no rebound, no guarding, no midline pulsatile mass, no hepatomegaly, no splenomegaly EXT:  2 plus pulses throughout, no edema, no cyanosis no clubbing SKIN:  No rashes no nodules NEURO:  Cranial nerves II through XII grossly intact, motor grossly intact throughout PSYCH:  Cognitively intact, oriented to person place and time   EKG:  EKG is not ordered today. The ekg ordered 05/07/15 demonstraSinus bradycardia rate 57 bpm. 05/13/16: Sinus rhythm. Rate 66 bpm. 09/09/16: Sinus rhythm. Rate 64 bpm.   10/26/16: Sinus rhythm.  Rate 74 bpm.  Exercise Myoveiw 08/26/15:  The left ventricular  ejection fraction is normal (  55-65%).  Nuclear stress EF: 60%.  Blood pressure demonstrated a hypertensive response to exercise.  Upsloping ST segment depression ST segment depression of 1 mm was noted during stress in the II, III, V6, V5 and aVF leads.  This is a low risk study.   No reversible ischemia. LVEF 60% with normal wall motion. Fair exercise tolerance. No chest pain. This is a low risk study.  TTE 01/23/19: 1. Left ventricular ejection fraction, by visual estimation, is 55 to  60%. The left ventricle has normal function. Left ventricular septal wall  thickness was mildly increased. Moderately increased left ventricular  posterior wall thickness. There is  mildly increased left ventricular hypertrophy.  2. Left ventricular diastolic parameters are indeterminate.  3. The left ventricle demonstrates regional wall motion abnormalities.  4. Elevated LVEDP. Hypokinesis of the basal septum consistent with  post-operative state.  5. Global right ventricle has normal systolic function.The right  ventricular size is normal. No increase in right ventricular wall  thickness.  6. Left atrial size was severely dilated.  7. Right atrial size was normal.  8. The mitral valve has been repaired/replaced. No evidence of mitral  valve regurgitation. No evidence of mitral stenosis.  9. The tricuspid valve is normal in structure.  10. The aortic valve is tricuspid. Aortic valve regurgitation is mild. No  evidence of aortic valve sclerosis or stenosis.  11. The pulmonic valve was normal in structure. Pulmonic valve  regurgitation is not visualized.  12. Normal pulmonary artery systolic pressure.  13. The inferior vena cava is normal in size with <50% respiratory  variability, suggesting right atrial pressure of 8 mmHg.   Recent Labs: 01/18/2020: B Natriuretic Peptide 123.4; BUN 11; Creatinine, Ser 0.79; Hemoglobin 11.3; Platelets 191; Potassium 4.5; Sodium 140 01/24/2020:  Magnesium 2.2   07/05/16: Total cholesterol 167, triglycerides 159, HDL 47, LDL 88 TSH 2.2 Sodium 141, potassium 3.8, BUN 14, creatinine 0.77 AST 13, ALT 14  08/03/2017: Total cholesterol 215, triglycerides 121, HDL 67, LDL 124  07/24/2019:  Sodium 137, potassium 4.4, BUN 14, creatinine 0.72 AST 14, ALT 15 Total cholesterol 168, triglycerides 94, HDL 62, LDL 89 TSH 2.39  Lipid Panel No results found for: CHOL, TRIG, HDL, CHOLHDL, VLDL, LDLCALC, LDLDIRECT    Wt Readings from Last 3 Encounters:  03/12/20 169 lb 3.2 oz (76.7 kg)  01/24/20 162 lb (73.5 kg)  01/18/20 162 lb (73.5 kg)     ASSESSMENT AND PLAN:  # Hypertension:  BP has been just better controlled in general but is high today.  She thinks this is why she is feeling poorly.  She has been under a lot of stress lately.  She will continue the losartan and carvedilol.  We will give Mary hydralazine 25 mg to take as needed for systolic blood pressure greater than 140.  We will not start a statin medication given that in general Mary blood pressure is well have been controlled and she has low diastolic blood pressures.  # Persitent atrial flutter/fibrillation:  Now maintaining sinus rhythm on Tikosyn.  Continue carvedilol and Eliquis.  She has diltiazem to take as needed for episodes of SVT or tachycardia.  # Mitral stenosis s/p bioprosthetic MVR: # Rheumatic heart disease: TEE showed severe MS.  She underwent successful minimally invasive bioprosthetic MVR with Mary Farrell 11/2018.  Mean gradient 5 mmHg 12/2018.    # Hyperlipidemia: ASCVD 10 year risk 15%.  She wants to really focus on diet and exercise before starting a statin.  We  will recheck in 4 months and reevaluate.  # Atypical chest pain:  Resolved with treatment of GERD.  No CAD on cath prior to Mary mitral valve repair.    Current medicines are reviewed at length with the patient today.  The patient does not have concerns regarding medicines.  The following changes  have been made:  Switch losartan tot valsartan  Labs/ tests ordered today include:   No orders of the defined types were placed in this encounter.    Disposition:   FU with Elliott Lasecki C. Oval Linsey, MD, [facility] in 2 months     Signed, Jakson Delpilar C. Oval Linsey, MD, [facility]  03/12/2020 5:09 PM    [facility]

## 2020-03-24 ENCOUNTER — Other Ambulatory Visit: Payer: Self-pay | Admitting: Student

## 2020-03-27 ENCOUNTER — Other Ambulatory Visit: Payer: Self-pay | Admitting: Cardiovascular Disease

## 2020-04-03 ENCOUNTER — Telehealth: Payer: Self-pay

## 2020-04-03 NOTE — Telephone Encounter (Signed)
Can take diltiazem 120 mg daily.

## 2020-04-03 NOTE — Telephone Encounter (Signed)
Returned call to Pt.  Pt has had more frequent episodes of tachycardia.  Pt states she had a brief episode last Sunday evening and then again Monday evening.  These episodes resolved quickly without medication.  Last night Pt states her episodes lasted longer.  Tachycardia started and she took a cardizem at 7:15 pm without success so she took another one at 8:15 pm.  She states heart racing finally stopped at 9:00 pm.  Then at 11:00 pm her heart started racing again so she took a 3rd dose of cardizem and went to sleep.  She states it feels "like a fish is flopping in my chest and it is uncomfortable"  Advised that Pt had done the right thing, and taken her medication correctly.  She had tried to call after hours last night but did not receive a call back (taken to supervisor).  Advised would forward to Dr. Curt Bears and nurse to see if he thinks she should be taking a daily dose of diltiazem.  Pt wonders if she should start taking cardizem 30 mg every night?  Advised to await feedback.  Pt is also needing a colonoscopy and is concerned about electrolyte shifts with prep for that.  Advised would have nurse follow up.

## 2020-04-03 NOTE — Telephone Encounter (Signed)
Mary Farrell to [name], MD      9:23 PM Hi Mary Farrell, Would you please give me a call? Sunday, Monday, and tonight I have had episodes of SVT that come and go. Heart rate going from 57 to 137. Each night, they seem to last for a longer time.  I took 2 - 30 mg. Diltiazem an hour apart tonight and they finally stopped around 9. I just need to talk to someone. Thank you, Mary Farrell BD: [birth year] [phone]

## 2020-04-04 NOTE — Telephone Encounter (Signed)
Returned call to pt. Informed that I would further discuss medication with physician as he recommended Diltiazem 120 mg.  Pt has been on this dose before, was stopped in 02/2019 d/t fatigue & bradycardia. Aware will forward back to Dr. Curt Bears for chart review and advisement.  3/9: BP 124/46, HR 53  BP 128/65, HR 133 3/10 BP 128/48, HR 52  Pt will continue her PRN Diltiazem over w/e if needed.  She will call on call MD if PRN medication doesn't work.  Otherwise we agreed to speak next week once advised upon by Dr. Curt Bears. She appreciates my call.

## 2020-04-06 NOTE — Telephone Encounter (Signed)
Ok to continue PRN meds. If this does not control or if symptoms recur needs to wear monitor to determine if this is AF or SVT.

## 2020-04-07 NOTE — Telephone Encounter (Signed)
Pt reports no issues over the weekend. Advised to take PRN medication at this time, if needed. Advised to let us know if continues and we will discuss monitor at that time (although not sure if monitor would give [place] anything being the infrequency of occurrences.  Pt also has kardia mobile at home) Patient verbalized understanding and agreeable to plan.

## 2020-05-05 ENCOUNTER — Encounter: Payer: Self-pay | Admitting: Cardiology

## 2020-05-05 ENCOUNTER — Ambulatory Visit (INDEPENDENT_AMBULATORY_CARE_PROVIDER_SITE_OTHER): Payer: Medicare Other | Admitting: Cardiology

## 2020-05-05 ENCOUNTER — Other Ambulatory Visit: Payer: Self-pay

## 2020-05-05 VITALS — BP 134/60 | HR 56 | Ht 60.0 in | Wt 169.2 lb

## 2020-05-05 DIAGNOSIS — I4819 Other persistent atrial fibrillation: Secondary | ICD-10-CM

## 2020-05-05 MED ORDER — CARVEDILOL 6.25 MG PO TABS: 6.2500 mg | ORAL_TABLET | Freq: Two times a day (BID) | ORAL | 3 refills | Status: DC

## 2020-05-05 NOTE — Addendum Note (Signed)
Addended by: Stanton Kidney on: 05/05/2020 01:04 PM   Modules accepted: Orders

## 2020-05-05 NOTE — Patient Instructions (Signed)
Medication Instructions:  Your physician has recommended you make the following change in your medication:  1. DECREASE Carvedilol to 6.25 mg twice daily  *If you need a refill on your cardiac medications before your next appointment, please call your pharmacy*   Lab Work: None ordered    Testing/Procedures: None ordered   Follow-Up: At [facility], you and your health needs are our priority.  As part of our continuing mission to provide you with exceptional heart care, we have created designated Provider Care Teams.  These Care Teams include your primary Cardiologist (physician) and Advanced Practice Providers (APPs -  Physician Assistants and Nurse Practitioners) who all work together to provide you with the care you need, when you need it.  Your next appointment:   6 month(s)  The format for your next appointment:   In Person  Provider:   You may see Will Meredith Leeds, MD or one of the following Advanced Practice Providers on your designated Care Team:    Chanetta Marshall, NP  Tommye Standard, PA-C  Legrand Como "[place]" [place], [place]     Thank you for choosing [facility]!!   Trinidad Curet, RN [phone]

## 2020-05-05 NOTE — Progress Notes (Signed)
Electrophysiology Office Note   Date:  05/05/2020   ID:  Mary Farrell, DOB [birth year], MRN [id number]  PCP:  Lawerance Cruel, MD  Cardiologist:  Oval Linsey Primary Electrophysiologist:  Shyniece Scripter Mary Leeds, MD    No chief complaint on file.    History of Present Illness: Mary Farrell is a 75 y.o. female who is being seen today for the evaluation of atrial fibrillation at the request of Lawerance Cruel, MD. Presenting today for electrophysiology evaluation.   She has a history of moderate aortic regurgitation, mild to moderate mitral stenosis, chronic pericardial effusion, paroxysmal atrial flutter, and hypertension.  She had a lead heart catheterization in 1986 which revealed normal coronary arteries with mild MS and mild pulmonary hypertension.  She is admitted 09/14/2015 with atrial flutter.  She was started on amiodarone but did not tolerate this due to nausea.  She has had multiple cardioversions in the past.  She is status post atrial fibrillation/flutter ablation 03/10/2017 with repeat ablation 06/13/2017.  She is now status post surgical MVR and maze November 2020.  She is currently on dofetilide after she has had multiple atrial arrhythmias.  Today, denies symptoms of palpitations, chest pain, shortness of breath, orthopnea, PND, lower extremity edema, claudication, dizziness, presyncope, syncope, bleeding, or neurologic sequela. The patient is tolerating medications without difficulties.  Since last being seen she has done well.  She has no chest pain or shortness of breath.  She able do all of her daily activities without restriction.  Unfortunately she has quite a few stressors at home.  Her son recently got out of prison.  He was incarcerated for 32 years.  She has another son who has bipolar disease who has been causing her issues.  Her husband also is being worked up for dementia.   Past Medical History:  Diagnosis Date  . Anxiety 06/29/2012  . Aortic regurgitation 08/13/2015   . Arthritis    "hands, wrists, back, feet, toes" (06/24/2017)  . Atrial flutter ([place]) 09/18/2015  . Atypical chest pain 05/13/2016  . Chest pain    remote cath in 1996 with NORMAL coronaries noted  . CHF (congestive heart failure) ([place])   . Dyspnea 10/30/2010  . Essential hypertension 09/05/2013  . Exogenous obesity    history of   . GERD (gastroesophageal reflux disease)   . Glaucoma, both eyes   . Headache, migraine    "stopped in my 50's" (09/18/2015)  . History of cardiovascular stress test 2007   showing no ischemia  . Hypertension   . Mitral stenosis and aortic insufficiency 06/23/2010  . Mitral stenosis with insufficiency   . Mitral stenosis with regurgitation   . Murmur    of mitral stenosis and moderate aortic insufficiency      . Nausea 05/01/2018  . OSA on CPAP   . Palpitations    occasional  . Paroxysmal A-fib ([place]) 06/24/2017  . Pericardial effusion 09/18/2015  . Persistent atrial fibrillation ([place]) 03/10/2017  . Personal history of rheumatic heart disease   . S/P minimally-invasive maze operation for atrial fibrillation 12/13/2018   Complete bilateral atrial lesion set using cryothermy and bipolar radiofrequency ablation with clipping of LA appendage via right mini-thoracotomy approach  . S/P minimally-invasive mitral valve replacement with bioprosthetic valve 12/13/2018   31 mm [facility] Mitral stented bovine pericardial tissue valve  . SBE (subacute bacterial endocarditis)    prophylaxis, patient unaware  . Sliding hiatal hernia   . Vertigo 05/01/2018   Past Surgical History:  Procedure Laterality Date  . ABDOMINAL HYSTERECTOMY  1975  . APPENDECTOMY  1977  . ATRIAL FIBRILLATION ABLATION N/A 03/10/2017   Procedure: ATRIAL FIBRILLATION ABLATION;  Surgeon: [name], MD;  Location: [place] [facility];  Service: Cardiovascular;  Laterality: N/A;  . ATRIAL FIBRILLATION ABLATION  06/24/2017  . ATRIAL FIBRILLATION ABLATION N/A 06/24/2017   Procedure: ATRIAL  FIBRILLATION ABLATION;  Surgeon: [name], MD;  Location: [place] [facility];  Service: Cardiovascular;  Laterality: N/A;  . BUNIONECTOMY Right 2000s  . CARDIAC CATHETERIZATION  01/13/1995   normal coronary anatomy and mild mitral stenosis and mild pulmonary hypertension  . CARDIOVERSION N/A 09/22/2015   Procedure: CARDIOVERSION;  Surgeon: Jerline Pain, MD;  Location: [place];  Service: Cardiovascular;  Laterality: N/A;  . CARDIOVERSION N/A 10/25/2016   Procedure: CARDIOVERSION;  Surgeon: Fay Records, MD;  Location: [place];  Service: Cardiovascular;  Laterality: N/A;  . CARDIOVERSION N/A 04/15/2017   Procedure: CARDIOVERSION;  Surgeon: Josue Hector, MD;  Location: [facility] ENDOSCOPY;  Service: Cardiovascular;  Laterality: N/A;  . CARDIOVERSION N/A 05/16/2017   Procedure: CARDIOVERSION;  Surgeon: Pixie Casino, MD;  Location: [facility] ENDOSCOPY;  Service: Cardiovascular;  Laterality: N/A;  . CARDIOVERSION N/A 01/24/2019   Procedure: CARDIOVERSION;  Surgeon: Pixie Casino, MD;  Location: [facility] ENDOSCOPY;  Service: Cardiovascular;  Laterality: N/A;  . CATARACT EXTRACTION W/ INTRAOCULAR LENS  IMPLANT, BILATERAL Bilateral 2013  . COLONOSCOPY  2013  . EYE SURGERY Bilateral    cataracts  . LAPAROSCOPIC CHOLECYSTECTOMY  1997  . MINIMALLY INVASIVE MAZE PROCEDURE N/A 12/13/2018   Procedure: MINIMALLY INVASIVE MAZE PROCEDURE using a 45 MM AtriClip.;  Surgeon: Rexene Alberts, MD;  Location: [place];  Service: Open Heart Surgery;  Laterality: N/A;  . MITRAL VALVE REPLACEMENT Right 12/13/2018   Procedure: MINIMALLY INVASIVE MITRAL VALVE (MV) REPLACEMENT using a Magna Mitral Ease 31 MM Valve.;  Surgeon: Rexene Alberts, MD;  Location: [place];  Service: Open Heart Surgery;  Laterality: Right;  . RIGHT/LEFT HEART CATH AND CORONARY ANGIOGRAPHY N/A 10/18/2018   Procedure: RIGHT/LEFT HEART CATH AND CORONARY ANGIOGRAPHY;  Surgeon: Leonie Man, MD;  Location: [place] [facility];  Service:  Cardiovascular;  Laterality: N/A;  . TEE WITHOUT CARDIOVERSION N/A 06/23/2017   Procedure: TRANSESOPHAGEAL ECHOCARDIOGRAM (TEE);  Surgeon: Fay Records, MD;  Location: [place];  Service: Cardiovascular;  Laterality: N/A;  . TEE WITHOUT CARDIOVERSION N/A 10/17/2018   Procedure: TRANSESOPHAGEAL ECHOCARDIOGRAM (TEE);  Surgeon: Jerline Pain, MD;  Location: [facility] ENDOSCOPY;  Service: Cardiovascular;  Laterality: N/A;  . TEE WITHOUT CARDIOVERSION N/A 12/13/2018   Procedure: TRANSESOPHAGEAL ECHOCARDIOGRAM (TEE);  Surgeon: Rexene Alberts, MD;  Location: [place];  Service: Open Heart Surgery;  Laterality: N/A;  . TONSILLECTOMY AND ADENOIDECTOMY  1990s  . UVULOPALATOPHARYNGOPLASTY, TONSILLECTOMY AND SEPTOPLASTY  1990s     Current Outpatient Medications  Medication Sig Dispense Refill  . acetaminophen (TYLENOL) 500 MG tablet Take 1,000 mg by mouth every 6 (six) hours as needed for moderate pain or headache.     . ALPRAZolam (XANAX) 0.25 MG tablet Take 0.125 mg by mouth 2 (two) times daily as needed for anxiety.     . Calcium Citrate-Vitamin D (CALCIUM CITRATE + D3) 200-250 MG-UNIT TABS Take 1 tablet by mouth daily with supper.    . carvedilol (COREG) 12.5 MG tablet TAKE 1 TABLET BY MOUTH 2 (TWO) TIMES DAILY WITH A MEAL. NEED OFFICE VISIT FOR FUTURE REFILL 180 tablet 3  .  cetirizine (ZYRTEC) 10 MG tablet Take 10 mg by mouth at bedtime.     . Cholecalciferol (VITAMIN D) 2000 UNITS CAPS Take 2,000 Units by mouth daily.    Marland Kitchen conjugated estrogens (PREMARIN) vaginal cream Place 1 Applicatorful vaginally daily as needed (irritation).     . Cyanocobalamin (VITAMIN B12) 1000 MCG TBCR Take 1,000 mcg by mouth daily.    Marland Kitchen diltiazem (CARDIZEM) 30 MG tablet Take 1 tablet (30 mg total) by mouth 4 (four) times daily as needed (for heart rates greater than 100). 30 tablet 1  . dofetilide (TIKOSYN) 500 MCG capsule TAKE 1 CAPSULE (500 MCG TOTAL) BY MOUTH 2 (TWO) TIMES DAILY. 180 capsule 3  . dorzolamide-timolol  (COSOPT) 22.3-6.8 MG/ML ophthalmic solution Place 1 drop into both eyes daily.    Marland Kitchen ELIQUIS 5 MG TABS tablet TAKE 1 TABLET BY MOUTH  TWICE DAILY 180 tablet 1  . famotidine (PEPCID) 20 MG tablet Take 20 mg by mouth at bedtime.    . fluticasone (FLONASE) 50 MCG/ACT nasal spray Place 1 spray into both nostrils daily as needed for allergies.    . furosemide (LASIX) 40 MG tablet Take 20 mg by mouth daily as needed for fluid.    Marland Kitchen gabapentin (NEURONTIN) 300 MG capsule Take 300 mg by mouth 3 (three) times daily.     . hydrALAZINE (APRESOLINE) 25 MG tablet TAKE 1 TABLET DAILY AS NEEDED FOR BLOOD PRESSURE ABOVE 140 30 tablet 5  . latanoprost (XALATAN) 0.005 % ophthalmic solution Place 1 drop into both eyes at bedtime.   6  . magnesium oxide (MAG-OX) 400 MG tablet Take 0.5 tablets (200 mg total) by mouth daily. 90 tablet 1  . Multiple Vitamin (MULTIVITAMIN WITH MINERALS) TABS tablet Take 1 tablet by mouth daily.    . NON FORMULARY Apply 1 application topically 2 (two) times daily as needed (for eczema). Traimcinolone/[employer] Moist Cream    . omeprazole (PRILOSEC) 40 MG capsule Take 40 mg by mouth daily.    Marland Kitchen oxybutynin (DITROPAN-XL) 10 MG 24 hr tablet Take 10 mg by mouth daily.    . polyethylene glycol (MIRALAX / GLYCOLAX) 17 g packet Take 17 g by mouth daily as needed for moderate constipation.     . potassium chloride SA (KLOR-CON) 20 MEQ tablet Take 20 mEq by mouth See admin instructions. Take as needed with furosemide    . PRESCRIPTION MEDICATION Inhale into the lungs at bedtime. CPAP    . valsartan (DIOVAN) 320 MG tablet TAKE 1 TABLET BY MOUTH EVERY DAY 90 tablet 1   No current facility-administered medications for this visit.    Allergies:   Tizanidine, Amoxicillin, Metoprolol tartrate, and Oxaprozin   Social History:  The patient  reports that she has never smoked. She has never used smokeless tobacco. She reports current alcohol use. She reports that she does not use drugs.   Family History:  The  patient's family history includes Diabetes in her brother; Heart attack in her mother; Heart failure in her maternal grandfather; Hypertension in her brother; Kidney disease in her brother.   ROS:  Please see the history of present illness.   Otherwise, review of systems is positive for none.   All other systems are reviewed and negative.   PHYSICAL EXAM: VS:  BP 134/60   Pulse (!) 56   Ht 5' (1.524 m)   Wt 169 lb 3.2 oz (76.7 kg)   SpO2 96%   BMI 33.04 kg/m  , BMI Body mass index is 33.04  kg/m. GEN: Well nourished, well developed, in no acute distress  HEENT: normal  Neck: no JVD, carotid bruits, or masses Cardiac: RRR; no murmurs, rubs, or gallops,no edema  Respiratory:  clear to auscultation bilaterally, normal work of breathing GI: soft, nontender, nondistended, + BS MS: no deformity or atrophy  Skin: warm and dry Neuro:  Strength and sensation are intact Psych: euthymic mood, full affect  EKG:  EKG is ordered today. Personal review of the ekg ordered shows sinus rhythm, rate 56  Recent Labs: 01/18/2020: B Natriuretic Peptide 123.4; BUN 11; Creatinine, Ser 0.79; Hemoglobin 11.3; Platelets 191; Potassium 4.5; Sodium 140 01/24/2020: Magnesium 2.2    Lipid Panel  No results found for: CHOL, TRIG, HDL, CHOLHDL, VLDL, LDLCALC, LDLDIRECT   Wt Readings from Last 3 Encounters:  05/05/20 169 lb 3.2 oz (76.7 kg)  03/12/20 169 lb 3.2 oz (76.7 kg)  01/24/20 162 lb (73.5 kg)      Other studies Reviewed: Additional studies/ records that were reviewed today include: TTE 01/23/19  Review of the above records today demonstrates:  1. Left ventricular ejection fraction, by visual estimation, is 55 to  60%. The left ventricle has normal function. Left ventricular septal wall  thickness was mildly increased. Moderately increased left ventricular  posterior wall thickness. There is  mildly increased left ventricular hypertrophy.  2. Left ventricular diastolic parameters are  indeterminate.  3. The left ventricle demonstrates regional wall motion abnormalities.  4. Elevated LVEDP. Hypokinesis of the basal septum consistent with  post-operative state.  5. Global right ventricle has normal systolic function.The right  ventricular size is normal. No increase in right ventricular wall  thickness.  6. Left atrial size was severely dilated.  7. Right atrial size was normal.  8. The mitral valve has been repaired/replaced. No evidence of mitral  valve regurgitation. No evidence of mitral stenosis.  9. The tricuspid valve is normal in structure.  10. The aortic valve is tricuspid. Aortic valve regurgitation is mild. No  evidence of aortic valve sclerosis or stenosis.  11. The pulmonic valve was normal in structure. Pulmonic valve  regurgitation is not visualized.  12. Normal pulmonary artery systolic pressure.  13. The inferior vena cava is normal in size with <50% respiratory  variability, suggesting right atrial pressure of 8 mmHg.    ASSESSMENT AND PLAN:  1.  Persistent atrial fibrillation/flutter: Status post ablation 03/10/2017 with repeat ablation 06/24/2017.  CHA2DS2-VASc of 3.  Currently on Coumadin, dofetilide, carvedilol.  High risk medication monitoring.  Did have a surgical maze when she had her mitral valve replaced.  She has continued to have episodes of bradycardia with some fatigue.  We Alayha Babineaux decrease carvedilol to 6.25 mg.  2.  Pericardial effusion: Now postop.  Feeling better.  Never had tamponade.    3.  Hypertension: Plan to decrease carvedilol today.  If she checks her blood pressure and it is high, further medication management at that time.  4.  Mitral stenosis: Status post MVR with a bioprosthetic mitral valve and maze.  Stable on most recent echo.  No changes.      Current medicines are reviewed at length with the patient today.   The patient does not have concerns regarding her medicines.  The following changes were made today:  Decrease carvedilol  Labs/ tests ordered today include:  Orders Placed This Encounter  Procedures  . EKG 12-Lead     Disposition:   FU with Mary Farrell 12 months  Signed, Juliani Laduke Mary Leeds, MD  05/05/2020 12:13 PM     [place] [address] [place] [address] [phone] (office) [phone] (fax)

## 2020-05-10 ENCOUNTER — Telehealth: Payer: Self-pay | Admitting: Cardiology

## 2020-05-10 NOTE — Telephone Encounter (Signed)
Patient has a hx of MVR and Maze procedure and saw Dr. Curt Bears on Monday with remote afib ablation.  She was seen by Dr. Curt Bears 05/05/20 and she was bradycardic in the 50's and her Carvedilol was decreased to 6.25mg  BID.  Tonight she went into atrial fibrillation with HR in the 130's.  She took Cardizem 30mg  x 3 over the past 3 hours.  She is under a lot of stress right now with her husband developing dementia.  She is chronically anticoagulated with Eliquis.  She took Carvedilol 12.5mg  at 7:30 PM as well as her Tikosyn.  Currently her BP is [id number] systolic with HR [id number].  Instructed her to take the additional Cardizem 30mg  (she had been instructed to take up to 4 short acting Cardizem 1 hour apart for PAF).  Patient will call back in about an hour and let me know how she is doing.

## 2020-05-12 NOTE — Progress Notes (Signed)
Cardiology Office Note:    Date:  05/14/2020   ID:  Mary Farrell, DOB Nov 17, 1945, MRN [id number]  PCP:  Lawerance Cruel, MD  Cardiologist:  Skeet Latch, MD  Electrophysiologist:  [name], MD   Referring MD: Lawerance Cruel, MD   Chief Complaint: follow-up of atrial fibrillation/tachycardia  History of Present Illness:    Mary Farrell is a 75 y.o. female with a history of normal coronary arteries on cardiac catheterization in 09/2018 prior to valve surgery, rheumatic mitral stenosis s/p MVR in 11/2018, mild aortic regurgitation on Echo in 12/2018, chronic pericardial effusion, persistent atrial fibrillation/ flutter s/p ablation in 02/2017 and repeat ablation in 05/2017 as well as MAZE procedure in 11/2018 on Eliquis, possible SVT, obstructive sleep apnea on CPAP, hypertension, and GERD who is followed by Dr. Oval Linsey and Dr. Curt Bears and presents today for follow-up.   Patient previously followed by Dr. Mare Ferrari. Remote cardiac catheterization in 12/1994 showed normal coronaries with mild MS and mild pulmonary hypertension. Cardiolite in 2007 negative for ischemia. She was seen by Truitt Merle, NP, in 04/2015 for worsening shortness of breath at which time an Echo was ordered and showed LVEF of 55-60% with moderate AI and mild to moderate MS. There was also a moderate pericardial effusion but no evidence of tamponade. When seen in clinic in 07/2015, she reported persistent shortness of breath with minimal activity and chest discomfort. Exercise Myoview as ordered and showed no perfusion defects. Patient was admitted in 08/2015 with atrial flutter. Echo at that time showed persistent moderate to severe pericardial effusion but no tamponade. She was started on Amiodarone but was unable to tolerate this due to nausea. She has had multiple cardioversions in the past. She underwent atrial fibrillation/flutter ablation in 02/2017 with repeat ablation in 05/2017. She is now s/p mitral  valve replacement for mitral stenosis in 11/2018 with a 50mm [facility] bioprosthetic valve. She had a MAZE procedure performed at the same time. Cardiac cath prior to surgery showed normal coronaries. She has had multiple atrial arrhythmias since then and has been loaded on Tikosyn. She also was noted have some junctional rhythm with associated dizziness. Therefore, Diltiazem was discontinued and changed to as needed basis only.   Patient was seen in the ED in 12/2019 for tachycardia. Of note, this occurred almost exactly 1 year after her brother's death. She was found to be in SVT vs atrial flutter with rates in the 130's. Vagal maneuvers were unsuccessful. She was given 6mg  of Adenosine and converted back to sinus rhythm. She was seen by Tommye Standard, PA-C, for follow-up later that month. EKG from ED visit was felt to be more consistent with atrial flutter but there were reportedly no flutter waves with Adenosine. Regardless, patient elected to continue with PRN Diltiazem rather than consideration of another ablation.   Patient was last seen by Dr. Oval Linsey in 02/2020 at which time she reported to be under a lot of stress at home but was stable from a cardiac standpoint. BP was high in the office but patient reported it was generally well controlled. Therefore, she was prescribed Hydralazine 25mg  to take only as needed for systolic BP > 505 mmHg. She was recently seen by Dr. Curt Bears on 05/05/2020 at which time she was doing well from a cardiac standpoint but again reported multiple stressors at home - one son recently got out of prison after being incarerated for 66 years ,another son has bipolar disorder, and her husband was being  worked up for dementia. Heart rates were in the 50's at that visit so Coreg was decreased to 6.25mg  twice daily.  Patient did call our after hours provider on 05/10/2020 for episode of atrial fibrillation with heart rates in the 130's. She took 3 dose of Cardizem 30mg  over 3 hours  but rates remained in the 130's. She was instructed to take another dose of Cardizem (she has been instructed to take up to 4 short acting Cardizem 1 hour apart for atrial fibrillation in the past).  Patient presents today for follow-up visit. Here alone. We discussed episode of 05/10/2020 when she had about a 4 hour episode of tachycardia with rates in the 130's. Eventually resolved after 4 doses of Cardizem. She showed we the strips from her [facility] app - she had a narrow complex tachycardia. P waves difficult to see but appears regular. Given rates were consistently in the 130's and then when she converted they went back to the 50's, I suspect this may have been atrial flutter. This is the first episode of tachycardia she has had since 12/2019. She denies any lightheadedness, dizziness with this. No syncope. Otherwise, no changes from last visit. She has some chronic stable shortness of breath but this is stable. She occasional has a "twinge" in her chest but nothing that sounds like angina. She continues have several home stressors and she does note being very stressed prior to this episode of tachycardia on 05/10/2020. Her husband was just diagnosed with dementia.   Of note, she states she tolerated the high dose of Coreg fine with no significant lightheadedness or dizziness with this. Pates keeps a close log of her BP and HR at home.  Heart rates mostly in the 50's to 60's (occasionally high 40's). Her BP has started to increase a little since Coreg was decreased. She has only required a few doses of PRN Hydralazine for systolic BP >416.   Past Medical History:  Diagnosis Date  . Anxiety 06/29/2012  . Aortic regurgitation 08/13/2015  . Arthritis    "hands, wrists, back, feet, toes" (06/24/2017)  . Atrial flutter ([place]) 09/18/2015  . Atypical chest pain 05/13/2016  . Chest pain    remote cath in 1996 with NORMAL coronaries noted  . CHF (congestive heart failure) ([place])   . Dyspnea 10/30/2010  .  Essential hypertension 09/05/2013  . Exogenous obesity    history of   . GERD (gastroesophageal reflux disease)   . Glaucoma, both eyes   . Headache, migraine    "stopped in my 50's" (09/18/2015)  . History of cardiovascular stress test 2007   showing no ischemia  . Hypertension   . Mitral stenosis and aortic insufficiency 06/23/2010  . Mitral stenosis with insufficiency   . Mitral stenosis with regurgitation   . Murmur    of mitral stenosis and moderate aortic insufficiency      . Nausea 05/01/2018  . OSA on CPAP   . Palpitations    occasional  . Paroxysmal A-fib ([place]) 06/24/2017  . Pericardial effusion 09/18/2015  . Persistent atrial fibrillation ([place]) 03/10/2017  . Personal history of rheumatic heart disease   . S/P minimally-invasive maze operation for atrial fibrillation 12/13/2018   Complete bilateral atrial lesion set using cryothermy and bipolar radiofrequency ablation with clipping of LA appendage via right mini-thoracotomy approach  . S/P minimally-invasive mitral valve replacement with bioprosthetic valve 12/13/2018   31 mm [facility] Mitral stented bovine pericardial tissue valve  . SBE (subacute bacterial endocarditis)  prophylaxis, patient unaware  . Sliding hiatal hernia   . Vertigo 05/01/2018    Past Surgical History:  Procedure Laterality Date  . ABDOMINAL HYSTERECTOMY  1975  . APPENDECTOMY  1977  . ATRIAL FIBRILLATION ABLATION N/A 03/10/2017   Procedure: ATRIAL FIBRILLATION ABLATION;  Surgeon: [name], MD;  Location: [place] [facility];  Service: Cardiovascular;  Laterality: N/A;  . ATRIAL FIBRILLATION ABLATION  06/24/2017  . ATRIAL FIBRILLATION ABLATION N/A 06/24/2017   Procedure: ATRIAL FIBRILLATION ABLATION;  Surgeon: [name], MD;  Location: [place] [facility];  Service: Cardiovascular;  Laterality: N/A;  . BUNIONECTOMY Right 2000s  . CARDIAC CATHETERIZATION  01/13/1995   normal coronary anatomy and mild mitral stenosis and mild  pulmonary hypertension  . CARDIOVERSION N/A 09/22/2015   Procedure: CARDIOVERSION;  Surgeon: Jerline Pain, MD;  Location: [place];  Service: Cardiovascular;  Laterality: N/A;  . CARDIOVERSION N/A 10/25/2016   Procedure: CARDIOVERSION;  Surgeon: Fay Records, MD;  Location: [place];  Service: Cardiovascular;  Laterality: N/A;  . CARDIOVERSION N/A 04/15/2017   Procedure: CARDIOVERSION;  Surgeon: Josue Hector, MD;  Location: [facility] ENDOSCOPY;  Service: Cardiovascular;  Laterality: N/A;  . CARDIOVERSION N/A 05/16/2017   Procedure: CARDIOVERSION;  Surgeon: Pixie Casino, MD;  Location: [facility] ENDOSCOPY;  Service: Cardiovascular;  Laterality: N/A;  . CARDIOVERSION N/A 01/24/2019   Procedure: CARDIOVERSION;  Surgeon: Pixie Casino, MD;  Location: [facility] ENDOSCOPY;  Service: Cardiovascular;  Laterality: N/A;  . CATARACT EXTRACTION W/ INTRAOCULAR LENS  IMPLANT, BILATERAL Bilateral 2013  . COLONOSCOPY  2013  . EYE SURGERY Bilateral    cataracts  . LAPAROSCOPIC CHOLECYSTECTOMY  1997  . MINIMALLY INVASIVE MAZE PROCEDURE N/A 12/13/2018   Procedure: MINIMALLY INVASIVE MAZE PROCEDURE using a 45 MM AtriClip.;  Surgeon: Rexene Alberts, MD;  Location: [place];  Service: Open Heart Surgery;  Laterality: N/A;  . MITRAL VALVE REPLACEMENT Right 12/13/2018   Procedure: MINIMALLY INVASIVE MITRAL VALVE (MV) REPLACEMENT using a Magna Mitral Ease 31 MM Valve.;  Surgeon: Rexene Alberts, MD;  Location: [place];  Service: Open Heart Surgery;  Laterality: Right;  . RIGHT/LEFT HEART CATH AND CORONARY ANGIOGRAPHY N/A 10/18/2018   Procedure: RIGHT/LEFT HEART CATH AND CORONARY ANGIOGRAPHY;  Surgeon: Leonie Man, MD;  Location: [place] [facility];  Service: Cardiovascular;  Laterality: N/A;  . TEE WITHOUT CARDIOVERSION N/A 06/23/2017   Procedure: TRANSESOPHAGEAL ECHOCARDIOGRAM (TEE);  Surgeon: Fay Records, MD;  Location: [place];  Service: Cardiovascular;  Laterality: N/A;  . TEE WITHOUT CARDIOVERSION N/A  10/17/2018   Procedure: TRANSESOPHAGEAL ECHOCARDIOGRAM (TEE);  Surgeon: Jerline Pain, MD;  Location: [facility] ENDOSCOPY;  Service: Cardiovascular;  Laterality: N/A;  . TEE WITHOUT CARDIOVERSION N/A 12/13/2018   Procedure: TRANSESOPHAGEAL ECHOCARDIOGRAM (TEE);  Surgeon: Rexene Alberts, MD;  Location: [place];  Service: Open Heart Surgery;  Laterality: N/A;  . TONSILLECTOMY AND ADENOIDECTOMY  1990s  . UVULOPALATOPHARYNGOPLASTY, TONSILLECTOMY AND SEPTOPLASTY  1990s    Current Medications: Current Meds  Medication Sig  . acetaminophen (TYLENOL) 500 MG tablet Take 1,000 mg by mouth every 6 (six) hours as needed for moderate pain or headache.   . ALPRAZolam (XANAX) 0.25 MG tablet Take 0.125 mg by mouth 2 (two) times daily as needed for anxiety.   . Calcium Citrate-Vitamin D (CALCIUM CITRATE + D3) 200-250 MG-UNIT TABS Take 1 tablet by mouth daily with supper.  . cetirizine (ZYRTEC) 10 MG tablet Take 10 mg by mouth at bedtime.   . Cholecalciferol (  VITAMIN D) 2000 UNITS CAPS Take 2,000 Units by mouth daily.  Marland Kitchen conjugated estrogens (PREMARIN) vaginal cream Place 1 Applicatorful vaginally daily as needed (irritation).   . Cyanocobalamin (VITAMIN B12) 1000 MCG TBCR Take 1,000 mcg by mouth daily.  Marland Kitchen diltiazem (CARDIZEM) 30 MG tablet Take 1 tablet (30 mg total) by mouth 4 (four) times daily as needed (for heart rates greater than 100).  . dofetilide (TIKOSYN) 500 MCG capsule TAKE 1 CAPSULE (500 MCG TOTAL) BY MOUTH 2 (TWO) TIMES DAILY.  Marland Kitchen dorzolamide-timolol (COSOPT) 22.3-6.8 MG/ML ophthalmic solution Place 1 drop into both eyes daily.  Marland Kitchen ELIQUIS 5 MG TABS tablet TAKE 1 TABLET BY MOUTH  TWICE DAILY  . famotidine (PEPCID) 20 MG tablet Take 20 mg by mouth at bedtime.  . fluticasone (FLONASE) 50 MCG/ACT nasal spray Place 1 spray into both nostrils daily as needed for allergies.  . furosemide (LASIX) 40 MG tablet Take 20 mg by mouth daily as needed for fluid.  Marland Kitchen gabapentin (NEURONTIN) 300 MG capsule Take 300 mg by  mouth 3 (three) times daily.   . hydrALAZINE (APRESOLINE) 25 MG tablet TAKE 1 TABLET DAILY AS NEEDED FOR BLOOD PRESSURE ABOVE 140  . latanoprost (XALATAN) 0.005 % ophthalmic solution Place 1 drop into both eyes at bedtime.   . magnesium oxide (MAG-OX) 400 MG tablet Take 0.5 tablets (200 mg total) by mouth daily.  . Multiple Vitamin (MULTIVITAMIN WITH MINERALS) TABS tablet Take 1 tablet by mouth daily.  . NON FORMULARY Apply 1 application topically 2 (two) times daily as needed (for eczema). Traimcinolone/[employer] Moist Cream  . omeprazole (PRILOSEC) 40 MG capsule Take 40 mg by mouth daily.  Marland Kitchen oxybutynin (DITROPAN-XL) 10 MG 24 hr tablet Take 10 mg by mouth daily.  . polyethylene glycol (MIRALAX / GLYCOLAX) 17 g packet Take 17 g by mouth daily as needed for moderate constipation.   . potassium chloride SA (KLOR-CON) 20 MEQ tablet Take 20 mEq by mouth See admin instructions. Take as needed with furosemide  . PRESCRIPTION MEDICATION Inhale into the lungs at bedtime. CPAP  . valsartan (DIOVAN) 320 MG tablet TAKE 1 TABLET BY MOUTH EVERY DAY  . [DISCONTINUED] carvedilol (COREG) 6.25 MG tablet Take 1 tablet (6.25 mg total) by mouth 2 (two) times daily.     Allergies:   Tizanidine, Amoxicillin, Metoprolol tartrate, and Oxaprozin   Social History   Socioeconomic History  . Marital status: Married    Spouse name: Not on file  . Number of children: Not on file  . Years of education: Not on file  . Highest education level: Not on file  Occupational History  . Occupation: Retired  Tobacco Use  . Smoking status: Never Smoker  . Smokeless tobacco: Never Used  Vaping Use  . Vaping Use: Never used  Substance and Sexual Activity  . Alcohol use: Yes    Comment: rarely  . Drug use: No  . Sexual activity: Not Currently  Other Topics Concern  . Not on file  Social History Narrative   Lives with husband in [place].   Social Determinants of Health   Financial Resource Strain: Not on file  Food  Insecurity: Not on file  Transportation Needs: Not on file  Physical Activity: Not on file  Stress: Not on file  Social Connections: Not on file     Family History: The patient's family history includes Diabetes in her brother; Heart attack in her mother; Heart failure in her maternal grandfather; Hypertension in her brother; Kidney disease in  her brother.  ROS:   Please see the history of present illness.     EKGs/Labs/Other Studies Reviewed:    The following studies were reviewed today:  Echocardiogram 10/16/2018: Impressions: 1. Left ventricular ejection fraction, by visual estimation, is 60 to  65%. The left ventricle has normal function. Normal left ventricular size.  There is no left ventricular hypertrophy.  2. Global right ventricle has normal systolic function.The right  ventricular size is normal. No increase in right ventricular wall  thickness.  3. Left atrial size was severely dilated.  4. Right atrial size was normal.  5. Small pericardial effusion.  6. Mild calcification of the posterior mitral valve leaflet(s).  7. Moderate thickening of the posterior mitral valve leaflet(s).  8. Moderately decreased mobility of the mitral valve leaflets.  9. The mitral valve is rheumatic. Mild mitral valve regurgitation.  Moderate-severe mitral stenosis.  10. Mean transmitral gradient is 48mmHg.   Viewed personally TEE from 2019 and value leaflets appear thin and non  calcified. Harmonics on current echocardiogram may be causing artifactual  thickening.  11. The tricuspid valve is normal in structure. Tricuspid valve  regurgitation was not visualized by color flow Doppler.  12. The aortic valve is normal in structure. Aortic valve regurgitation is  mild by color flow Doppler. Mild aortic valve sclerosis without stenosis.  13. The pulmonic valve was normal in structure. Pulmonic valve  regurgitation is trivial by color flow Doppler.  14. Mildly elevated pulmonary  artery systolic pressure.  15. The inferior vena cava is normal in size with greater than 50%  respiratory variability, suggesting right atrial pressure of 3 mmHg.  16. Rheumatic mitral valve stenosis. Transmitral gradient has increased.  Consider repeat TEE and stress echo to evaluate transmitral gradient  during exercise. May need a candidate for balloon valvuloplasty.  _______________  Right/Left Cardiac Catheterization 10/18/2018:  Hemodynamic findings consistent with mild pulmonary hypertension.  LV end diastolic pressure is normal.  There is severe mitral valve stenosis -suggested by a echocardiogram, large PCWP V waves along with elevated PCWP but normal LVEDP would also suggest this.  Angiographically normal coronary arteries   Summary:  Angiographically normal coronary arteries  Mild pulmonary hypertension with mildly elevated PCWP but normal LVEDP suggestive of mitral stenosis  Significant V wave noted on PCWP waveform suggesting mitral stenosis.  Recommendations:  Return to nursing unit for ongoing care.    Proceed with plans for mitral valve replacement _______________  Zio Monitor 11/16/2018 to 11/29/2018: Max 148 bpm 05:47pm, 10/23 Min 25 bpm 08:55am, 10/28 Avg 62 bpm  Rare PACs and PVCs 3% atrial fibrillation/flutter burden 7 pauses, longest 4.3 seconds Symptoms associated with sinus rhythm, atrial flutter, and sinus bradycardia with pauses _______________  Echocardiogram 01/23/2019: Impressions: 1. Left ventricular ejection fraction, by visual estimation, is 55 to  60%. The left ventricle has normal function. Left ventricular septal wall  thickness was mildly increased. Moderately increased left ventricular  posterior wall thickness. There is  mildly increased left ventricular hypertrophy.  2. Left ventricular diastolic parameters are indeterminate.  3. The left ventricle demonstrates regional wall motion abnormalities.  4. Elevated LVEDP.  Hypokinesis of the basal septum consistent with  post-operative state.  5. Global right ventricle has normal systolic function.The right  ventricular size is normal. No increase in right ventricular wall  thickness.  6. Left atrial size was severely dilated.  7. Right atrial size was normal.  8. The mitral valve has been repaired/replaced. No evidence of mitral  valve regurgitation.  No evidence of mitral stenosis.  9. The tricuspid valve is normal in structure.  10. The aortic valve is tricuspid. Aortic valve regurgitation is mild. No  evidence of aortic valve sclerosis or stenosis.  11. The pulmonic valve was normal in structure. Pulmonic valve  regurgitation is not visualized.  12. Normal pulmonary artery systolic pressure.  13. The inferior vena cava is normal in size with <50% respiratory  variability, suggesting right atrial pressure of 8 mmHg.   In comparison to the previous echocardiogram(s): Bioprosthetic mitral  valve now present and functioning properly. Mean gradient 5 mmHg.    EKG:  EKG ordered today. EKG personally reviewed and demonstrates: Sinus bradycardia, rate 59 bpm, with borderline 1st degree AV block. No acute ST/ T changes. Normal axis. QTc 4106ms.  Recent Labs: 01/18/2020: B Natriuretic Peptide 123.4; BUN 11; Creatinine, Ser 0.79; Hemoglobin 11.3; Platelets 191; Potassium 4.5; Sodium 140 01/24/2020: Magnesium 2.2  Recent Lipid Panel No results found for: CHOL, TRIG, HDL, CHOLHDL, VLDL, LDLCALC, LDLDIRECT  Physical Exam:    Vital Signs: BP (!) 160/71   Pulse 69   Ht 5' (1.524 m)   Wt 166 lb 12.8 oz (75.7 kg)   SpO2 97%   BMI 32.58 kg/m     Wt Readings from Last 3 Encounters:  05/14/20 166 lb 12.8 oz (75.7 kg)  05/05/20 169 lb 3.2 oz (76.7 kg)  03/12/20 169 lb 3.2 oz (76.7 kg)     General: 75 y.o. female in no acute distress. HEENT: Normocephalic and atraumatic. Sclera clear.  Neck: Supple. No JVD. Heart: RRR. Distinct S1 and S2. No  significant murmurs, gallops, or rubs. Radial pulses 2+ and equal bilaterally. Lungs: No increased work of breathing. Clear to ausculation bilaterally. No wheezes, rhonchi, or rales.  Abdomen: Soft, non-distended, and non-tender to palpation.   Extremities: No to trace lower extremity edema bilaterally. Skin: Warm and dry. Neuro: Alert and oriented x3. No focal deficits. Psych: Normal affect. Responds appropriately.   Assessment:    1. Persistent atrial fibrillation/flutter   2. SVT (supraventricular tachycardia) ([place])   3. Mitral valve stenosis, unspecified etiology   4. S/P minimally-invasive mitral valve replacement with bioprosthetic valve   5. Chronic pericardial effusion   6. Primary hypertension   7. Hyperlipidemia, unspecified hyperlipidemia type   8. Medication management     Plan:    Persistent Atrial Fibrillation/Flutter Possible SVT - S/p atrial fibrillation/flutter ablation in 02/2017 and repeat ablation in 05/2017 as well as MAZE procedure at time of valve surgery in 11/2018. Has been doing well but had an episode of tachycardia (suspect atrial flutter) with rates in the 130's on 05/10/2020 after Coreg dose was decreased due to low resting heart rates.  - EKG today shows sinus bradycarida with rate of 59 bpm. - Will increase Coreg to 9.375mg  twice daily. - Continue Tikosyn 500mg  twice daily.   - Continue Cardizem 30mg  as needed for palpitations.  - Continue chronic anticoagulation with Eliquis 5mg  twice daily.  - Will check BMET, Mg, and TSH today to rule out underlying cause for tachycardic episode.  Did discuss above plan with Dr. Ileana Ladd who agreed.  Mitral Stenosis s/p MVR - History of minimally invasive bioprosthetic mitral valve replacement in 11/2018.  - Echo in 12/2018 showed normal LVEF with no evidence of MR or MS. Mean gradient 5 mmHg. - No significant murmur on exam. Can consider updating Echo in the future but will hold off at this time.  Chronic  Pericardial Effusion - History  of chronic pericardial effusion. However, no mention of pericardial effusion on most recent Echo in 12/2018.  Hypertension - BP elevated in the office at 160/71. - Will increase Coreg as above. - Continue Valsartan 320mg  daily - Continue PRN Hydralazine 25mg  for systolic BP >854.   - Also takes PRN Lasix for weight gain and edema.   Hyperlipidemia - Most recent lipid panel from 06/2019: Total Cholesterol 168, Triglycerides 94, HDL 62, LDL 89. - ASCVD 10 year risk >7.5%. Therefore, moderate to high intensity statin recommended. However, patient has wanted to focus on diet and exercise rather than starting a statin.  - Labs followed by PCP.   Disposition: Follow up in 3 months.   Medication Adjustments/Labs and Tests Ordered: Current medicines are reviewed at length with the patient today.  Concerns regarding medicines are outlined above.  Orders Placed This Encounter  Procedures  . Magnesium  . TSH  . Basic Metabolic Panel (BMET)  . EKG 12-Lead   Meds ordered this encounter  Medications  . carvedilol (COREG) 6.25 MG tablet    Sig: Take 1.5 tablets (9.375 mg total) by mouth 2 (two) times daily.    Dispense:  270 tablet    Refill:  3    Dose decrease    Patient Instructions  Medication Instructions:  INCREASE- Carvedilol 9.375 mg( 1 1/2 tablets) by mouth twice a day  *If you need a refill on your cardiac medications before your next appointment, please call your pharmacy*   Lab Work: Magnesium, BMP and TSH  If you have labs (blood work) drawn today and your tests are completely normal, you will receive your results only by: Marland Kitchen MyChart Message (if you have MyChart) OR . A paper copy in the mail If you have any lab test that is abnormal or we need to change your treatment, we will call you to review the results.   Testing/Procedures: None Ordered   Follow-Up: At [facility], you and your health needs are our priority.  As part of  our continuing mission to provide you with exceptional heart care, we have created designated Provider Care Teams.  These Care Teams include your primary Cardiologist (physician) and Advanced Practice Providers (APPs -  Physician Assistants and Nurse Practitioners) who all work together to provide you with the care you need, when you need it.  We recommend signing up for the patient portal called "MyChart".  Sign up information is provided on this After Visit Summary.  MyChart is used to connect with patients for Virtual Visits (Telemedicine).  Patients are able to view lab/test results, encounter notes, upcoming appointments, etc.  Non-urgent messages can be sent to your provider as well.   To learn more about what you can do with MyChart, go to [email or web address].    Your next appointment:   3 month(s)  The format for your next appointment:   In Person  Provider:   You may see Skeet Latch, MD or one of the following Advanced Practice Providers on your designated Care Team:    Sande Rives, PA-C  Coletta Memos, FNP         Signed, Darreld Mclean, [place]  05/14/2020 5:27 PM    [place]

## 2020-05-13 NOTE — Telephone Encounter (Signed)
Called pt to schedule APP appt per Dr. Curt Bears. Pt already has appt w/ cardiology PA tomorrow.   She will keep that appt and discuss treatment plan.  Pt aware that if needed PA can reach out to [facility] to discuss. Patient verbalized understanding and agreeable to plan.

## 2020-05-14 ENCOUNTER — Other Ambulatory Visit: Payer: Self-pay

## 2020-05-14 ENCOUNTER — Ambulatory Visit (INDEPENDENT_AMBULATORY_CARE_PROVIDER_SITE_OTHER): Payer: Medicare Other | Admitting: Student

## 2020-05-14 ENCOUNTER — Encounter: Payer: Self-pay | Admitting: Student

## 2020-05-14 VITALS — BP 160/71 | HR 69 | Ht 60.0 in | Wt 166.8 lb

## 2020-05-14 DIAGNOSIS — I313 Pericardial effusion (noninflammatory): Secondary | ICD-10-CM | POA: Diagnosis not present

## 2020-05-14 DIAGNOSIS — I050 Rheumatic mitral stenosis: Secondary | ICD-10-CM

## 2020-05-14 DIAGNOSIS — Z953 Presence of xenogenic heart valve: Secondary | ICD-10-CM

## 2020-05-14 DIAGNOSIS — I10 Essential (primary) hypertension: Secondary | ICD-10-CM

## 2020-05-14 DIAGNOSIS — I471 Supraventricular tachycardia: Secondary | ICD-10-CM | POA: Diagnosis not present

## 2020-05-14 DIAGNOSIS — Z79899 Other long term (current) drug therapy: Secondary | ICD-10-CM

## 2020-05-14 DIAGNOSIS — I3139 Other pericardial effusion (noninflammatory): Secondary | ICD-10-CM

## 2020-05-14 DIAGNOSIS — I4819 Other persistent atrial fibrillation: Secondary | ICD-10-CM | POA: Diagnosis not present

## 2020-05-14 DIAGNOSIS — E785 Hyperlipidemia, unspecified: Secondary | ICD-10-CM

## 2020-05-14 MED ORDER — CARVEDILOL 6.25 MG PO TABS: 9.3750 mg | ORAL_TABLET | Freq: Two times a day (BID) | ORAL | 3 refills | Status: DC

## 2020-05-14 NOTE — Patient Instructions (Signed)
Medication Instructions:  INCREASE- Carvedilol 9.375 mg( 1 1/2 tablets) by mouth twice a day  *If you need a refill on your cardiac medications before your next appointment, please call your pharmacy*   Lab Work: Magnesium, BMP and TSH  If you have labs (blood work) drawn today and your tests are completely normal, you will receive your results only by: Marland Kitchen MyChart Message (if you have MyChart) OR . A paper copy in the mail If you have any lab test that is abnormal or we need to change your treatment, we will call you to review the results.   Testing/Procedures: None Ordered   Follow-Up: At [facility], you and your health needs are our priority.  As part of our continuing mission to provide you with exceptional heart care, we have created designated Provider Care Teams.  These Care Teams include your primary Cardiologist (physician) and Advanced Practice Providers (APPs -  Physician Assistants and Nurse Practitioners) who all work together to provide you with the care you need, when you need it.  We recommend signing up for the patient portal called "MyChart".  Sign up information is provided on this After Visit Summary.  MyChart is used to connect with patients for Virtual Visits (Telemedicine).  Patients are able to view lab/test results, encounter notes, upcoming appointments, etc.  Non-urgent messages can be sent to your provider as well.   To learn more about what you can do with MyChart, go to [email or web address].    Your next appointment:   3 month(s)  The format for your next appointment:   In Person  Provider:   You may see Skeet Latch, MD or one of the following Advanced Practice Providers on your designated Care Team:    [place], PA-C  Coletta Memos, FNP

## 2020-05-15 LAB — TSH: TSH: 2.18 u[IU]/mL (ref 0.450–4.500)

## 2020-05-15 LAB — BASIC METABOLIC PANEL
BUN/Creatinine Ratio: 12 (ref 12–28)
BUN: 11 mg/dL (ref 8–27)
CO2: 23 mmol/L (ref 20–29)
Calcium: 9.4 mg/dL (ref 8.7–10.3)
Chloride: 103 mmol/L (ref 96–106)
Creatinine, Ser: 0.91 mg/dL (ref 0.57–1.00)
Glucose: 92 mg/dL (ref 65–99)
Potassium: 4.3 mmol/L (ref 3.5–5.2)
Sodium: 140 mmol/L (ref 134–144)
eGFR: 65 mL/min/{1.73_m2} (ref >59)

## 2020-05-15 LAB — MAGNESIUM: Magnesium: 2.2 mg/dL (ref 1.6–2.3)

## 2020-07-14 ENCOUNTER — Ambulatory Visit (HOSPITAL_COMMUNITY): Payer: Medicare Other | Admitting: Physician Assistant

## 2020-07-14 ENCOUNTER — Encounter (HOSPITAL_COMMUNITY): Payer: Self-pay

## 2020-07-14 NOTE — Telephone Encounter (Signed)
Clint Fenton PA reviewed EKG strips sent in. Confirmed normal rhythm. Canceled patient appointment for today as she is back to baseline. Pt will call if issues arise.

## 2020-07-16 MED ORDER — CARVEDILOL 3.125 MG PO TABS: 3.1250 mg | ORAL_TABLET | Freq: Two times a day (BID) | ORAL | 11 refills | Status: DC

## 2020-07-16 MED ORDER — CARVEDILOL 6.25 MG PO TABS: 6.2500 mg | ORAL_TABLET | Freq: Two times a day (BID) | ORAL | 3 refills | Status: DC

## 2020-08-01 NOTE — Progress Notes (Signed)
Cardiology Office Note:    Date:  08/14/2020   ID:  Mary Farrell, DOB 1945-06-20, MRN [id number]  PCP:  Lawerance Cruel, MD  Cardiologist:  Skeet Latch, MD  Electrophysiologist:  [name], MD   Referring MD: Lawerance Cruel, MD   Chief Complaint: follow-up of atrial fibrillation  History of Present Illness:    Mary Farrell is a 75 y.o. female with a history of normal coronary arteries on cardiac catheterization in 09/2018 prior to valve surgery, rheumatic mitral stenosis s/p MVR in 11/2018, mild aortic regurgitation on Echo in 12/2018, chronic pericardial effusion, persistent atrial fibrillation/ flutter s/p ablation in 02/2017 and repeat ablation in 05/2017 as well as MAZE procedure in 11/2018 on Eliquis, possible SVT, obstructive sleep apnea on CPAP, hypertension, and GERD who is followed by Dr. Oval Linsey and Dr. Curt Bears and presents today for follow-up.   Patient previously followed by Dr. Mare Ferrari. Remote cardiac catheterization in 12/1994 showed normal coronaries with mild MS and mild pulmonary hypertension. Cardiolite in 2007 negative for ischemia. She was seen by Truitt Merle, NP, in 04/2015 for worsening shortness of breath at which time an Echo was ordered and showed LVEF of 55-60% with moderate AI and mild to moderate MS. There was also a moderate pericardial effusion but no evidence of tamponade. When seen in clinic in 07/2015, she reported persistent shortness of breath with minimal activity and chest discomfort. Exercise Myoview as ordered and showed no perfusion defects. Patient was admitted in 08/2015 with atrial flutter. Echo at that time showed persistent moderate to severe pericardial effusion but no tamponade. She was started on Amiodarone but was unable to tolerate this due to nausea. She has had multiple cardioversions in the past. She underwent atrial fibrillation/flutter ablation in 02/2017 with repeat ablation in 05/2017. She is now s/p mitral valve  replacement for mitral stenosis in 11/2018 with [facility]. She had a MAZE procedure performed at the same time. Cardiac cath prior to surgery showed normal coronaries. She has had multiple atrial arrhythmias since then and has been loaded on Tikosyn. She also was noted have some junctional rhythm with associated dizziness. Therefore, Diltiazem was discontinued and changed to as needed basis only.    Patient was seen in the ED in 12/2019 for tachycardia. Of note, this occurred almost exactly 1 year after her brother's death. She was found to be in SVT vs atrial flutter with rates in the 130's. Vagal maneuvers were unsuccessful. She was given 649mof Adenosine and converted back to sinus rhythm. She was seen by ReTommye StandardPA-C, for follow-up later that month. EKG from ED visit was felt to be more consistent with atrial flutter but there were reportedly no flutter waves with Adenosine. Regardless, patient elected to continue with PRN Diltiazem rather than consideration of another ablation.   Patient was last seen by Dr. RaOval Linseyn 02/2020 at which time she reported to be under a lot of stress at home but was stable from a cardiac standpoint. BP was high in the office but patient reported it was generally well controlled. Therefore, she was prescribed Hydralazine 2514mo take only as needed for systolic BP > 140572Hg. She was seen by Dr. CamCurt Bears 05/05/2020 at which time she was doing well from a cardiac standpoint but again reported multiple stressors at home - one son recently got out of prison after being incarerated for 32 66ars ,another son has bipolar disorder, and her husband was being  worked up for dementia. Heart rates were in the 50's at that visit so Coreg was decreased to 6.68m twice daily.  Patient called our after hours provider on 05/10/2020 for episode of atrial fibrillation with heart rates in the 130's. Eventual resolved after 4 doses of Cardizem. I reviewed her  KDanaher Corporationwhich showed a narrow complex tachycardia. Given rates were consistently in the 130s and then when she converted they went back to the 50s, I felt like this was likely atrial flutter. She was otherwise stable from a cardiac standpoint.   Patient presents today for follow-up. Here alone. Patient again describes multiple stressors at home including her husband who has early dementia and is in denial and her children. However, overall she has been stable from a cardiac standpoint. No significant palpitations. She did have a couple of days in the middle of June where she had atrial fibrillation based but went back into normal sinus rhythm with PRN Cardizem. She doesn't remember having significant symptoms with these episodes. She does state she can normally feel the episodes coming on because she feels her heart racing. She has occasional lightheadedness/dizziness due to her vertigo (worse when turning her head a certain way or bending over). No syncope. She reports occasional random chest aching on the left side of her chest. She states this is not new and has been going on for a long time. Occurs at rest. No exertional chest pain. Last for about 10 minutes and then goes away. Does not sound like angina to me. She has chronic dyspnea both at rest and with exertion but states this has been going on for a long time and is stable. She sleeps on a wedge but this is not new. No PND. She uses CPAP machine. No lower extremity edema. No abnormal bleeding on Eliquis.   Reviewed Recent Labs from PCP from 07/10/2020: - Lipid panel: Total Cholesterol 141, Triglycerides 158, HDL 41, LDL 73.  - CMET: Na 139, K 3.9, Glucose 91, BUN 11, Cr 0.88, Albumin 3.8, AST 15, ALT 14, Alk Phos 62, Total Bili 0.5.  - Magnesium 2.1.   Past Medical History:  Diagnosis Date   Anxiety 06/29/2012   Aortic regurgitation 08/13/2015   Arthritis    "hands, wrists, back, feet, toes" (06/24/2017)   [place] 09/18/2015    Atypical chest pain 05/13/2016   Chest pain    remote cath in 1996 with NORMAL coronaries noted   CHF (congestive heart failure) ([facility]    Dyspnea 10/30/2010   Essential hypertension 09/05/2013   Exogenous obesity    history of    GERD (gastroesophageal reflux disease)    Glaucoma, both eyes    Headache, migraine    "stopped in my 533's (09/18/2015)   History of cardiovascular stress test 2007   showing no ischemia   Hypertension    Mitral stenosis and aortic insufficiency 06/23/2010   Mitral stenosis with insufficiency    Mitral stenosis with regurgitation    Murmur    of mitral stenosis and moderate aortic insufficiency       Nausea 05/01/2018   OSA on CPAP    Palpitations    occasional   Paroxysmal A-fib ([place] 06/24/2017   Pericardial effusion 09/18/2015   Persistent atrial fibrillation ([place] 03/10/2017   Personal history of rheumatic heart disease    S/P minimally-invasive maze operation for atrial fibrillation 12/13/2018   Complete bilateral atrial lesion set using cryothermy and bipolar radiofrequency ablation with clipping of LA appendage  via right mini-thoracotomy approach   S/P minimally-invasive mitral valve replacement with bioprosthetic valve 12/13/2018   31 mm [facility] Mitral stented bovine pericardial tissue valve   SBE (subacute bacterial endocarditis)    prophylaxis, patient unaware   Sliding hiatal hernia    Vertigo 05/01/2018    Past Surgical History:  Procedure Laterality Date   [place] N/A 03/10/2017   Procedure: ATRIAL FIBRILLATION ABLATION;  Surgeon: [name], MD;  Location: [place] CV LAB;  Service: Cardiovascular;  Laterality: N/A;   ATRIAL FIBRILLATION ABLATION  06/24/2017   ATRIAL FIBRILLATION ABLATION N/A 06/24/2017   Procedure: ATRIAL FIBRILLATION ABLATION;  Surgeon: [name], MD;  Location: [place] CV LAB;  Service: Cardiovascular;  Laterality:  N/A;   BUNIONECTOMY Right 2000s   CARDIAC CATHETERIZATION  01/13/1995   normal coronary anatomy and mild mitral stenosis and mild pulmonary hypertension   CARDIOVERSION N/A 09/22/2015   Procedure: CARDIOVERSION;  Surgeon: Jerline Pain, MD;  Location: [place];  Service: Cardiovascular;  Laterality: N/A;   CARDIOVERSION N/A 10/25/2016   Procedure: CARDIOVERSION;  Surgeon: Fay Records, MD;  Location: [place];  Service: Cardiovascular;  Laterality: N/A;   CARDIOVERSION N/A 04/15/2017   Procedure: CARDIOVERSION;  Surgeon: Josue Hector, MD;  Location: [facility] ENDOSCOPY;  Service: Cardiovascular;  Laterality: N/A;   CARDIOVERSION N/A 05/16/2017   Procedure: CARDIOVERSION;  Surgeon: Pixie Casino, MD;  Location[facility] END[facility]COPY;  Service: Cardiovascular;  Laterality: N/A;   CARDIOVERSION N/A 01/24/2019   Procedure: CARDIOVERSION;  Surgeon: Pixie Casino, MD;  Location: [facility] ENDOSCOPY;  Service: Cardiovascular;  Laterality: N/A;   CATARACT EXTRACTION W/ INTRAOCULAR LENS  IMPLANT, BILATERAL Bilateral 2013   COLONOSCOPY  2013   EYE SURGERY Bilateral    cataracts   LAPAROSCOPIC CHOLECYSTECTOMY  1997   MINIMALLY INVASIVE MAZE PROCEDURE N/A 12/13/2018   Procedure: MINIMALLY INVASIVE MAZE PROCEDURE using a 45 MM AtriClip.;  Surgeon: Rexene Alberts, MD;  Location: [place];  Service: Open Heart Surgery;  Laterality: N/A;   MITRAL VALVE REPLACEMENT Right 12/13/2018   Procedure: MINIMALLY INVASIVE MITRAL VALVE (MV) REPLACEMENT using a Magna Mitral Ease 31 MM Valve.;  Surgeon: Rexene Alberts, MD;  Location: [place];  Service: Open Heart Surgery;  Laterality: Right;   RIGHT/LEFT HEART CATH AND CORONARY ANGIOGRAPHY N/A 10/18/2018   Procedure: RIGHT/LEFT HEART CATH AND CORONARY ANGIOGRAPHY;  Surgeon: Leonie Man, MD;  Location: [place] CV LAB;  Service: Cardiovascular;  Laterality: N/A;   TEE WITHOUT CARDIOVERSION N/A 06/23/2017   Procedure: TRANSESOPHAGEAL ECHOCARDIOGRAM (TEE);  Surgeon: Fay Records, MD;  Location: [place];  Service: Cardiovascular;  Laterality: N/A;   TEE WITHOUT CARDIOVERSION N/A 10/17/2018   Procedure: TRANSESOPHAGEAL ECHOCARDIOGRAM (TEE);  Surgeon: Jerline Pain, MD;  Location[facility] END[facility]COPY;  Service: Cardiovascular;  Laterality: N/A;   TEE WITHOUT CARDIOVERSION N/A 12/13/2018   Procedure: TRANSESOPHAGEAL ECHOCARDIOGRAM (TEE);  Surgeon: Rexene Alberts, MD;  Location: [place];  Service: Open Heart Surgery;  Laterality: N/A;   TONSILLECTOMY AND ADENOIDECTOMY  1990s   UVULOPALATOPHARYNGOPLASTY, TONSILLECTOMY AND SEPTOPLASTY  1990s    Current Medications: Current Meds  Medication Sig   acetaminophen (TYLENOL) 500 MG tablet Take 1,000 mg by mouth every 6 (six) hours as needed for moderate pain or headache.    ALPRAZolam (XANAX) 0.25 MG tablet Take 0.125 mg by mouth 2 (two) times daily as needed for anxiety.  Calcium Citrate-Vitamin D (CALCIUM CITRATE + D3) 200-250 MG-UNIT TABS Take 1 tablet by mouth daily with supper.   carvedilol (COREG) 3.125 MG tablet Take 1 tablet (3.125 mg total) by mouth 2 (two) times daily. Take 1 tablet in addition to your carvedilol 6.25 mg twice daily for total dose of 9.375 mg twice daily.   carvedilol (COREG) 6.25 MG tablet Take 1 tablet (6.25 mg total) by mouth 2 (two) times daily.   cetirizine (ZYRTEC) 10 MG tablet Take 10 mg by mouth at bedtime.    Cholecalciferol (VITAMIN D) 2000 UNITS CAPS Take 2,000 Units by mouth daily.   conjugated estrogens (PREMARIN) vaginal cream Place 1 Applicatorful vaginally daily as needed (irritation).    Cyanocobalamin (VITAMIN B12) 1000 MCG TBCR Take 1,000 mcg by mouth daily.   diltiazem (CARDIZEM) 30 MG tablet Take 1 tablet (30 mg total) by mouth 4 (four) times daily as needed (for heart rates greater than 100).   dofetilide (TIKOSYN) 500 MCG capsule TAKE 1 CAPSULE (500 MCG TOTAL) BY MOUTH 2 (TWO) TIMES DAILY.   dorzolamide-timolol (COSOPT) 22.3-6.8 MG/ML ophthalmic solution Place 1 drop into  both eyes daily.   ELIQUIS 5 MG TABS tablet TAKE 1 TABLET BY MOUTH  TWICE DAILY   famotidine (PEPCID) 20 MG tablet Take 20 mg by mouth at bedtime.   fluticasone (FLONASE) 50 MCG/ACT nasal spray Place 1 spray into both nostrils daily as needed for allergies.   gabapentin (NEURONTIN) 300 MG capsule Take 300 mg by mouth 3 (three) times daily.    hydrALAZINE (APRESOLINE) 25 MG tablet TAKE 1 TABLET DAILY AS NEEDED FOR BLOOD PRESSURE ABOVE 140   latanoprost (XALATAN) 0.005 % ophthalmic solution Place 1 drop into both eyes at bedtime.    magnesium oxide (MAG-OX) 400 MG tablet Take 0.5 tablets (200 mg total) by mouth daily.   Multiple Vitamin (MULTIVITAMIN WITH MINERALS) TABS tablet Take 1 tablet by mouth daily.   NON FORMULARY Apply 1 application topically 2 (two) times daily as needed (for eczema). Traimcinolone/[employer] Moist Cream   omeprazole (PRILOSEC) 40 MG capsule Take 40 mg by mouth daily.   oxybutynin (DITROPAN-XL) 10 MG 24 hr tablet Take 10 mg by mouth daily.   polyethylene glycol (MIRALAX / GLYCOLAX) 17 g packet Take 17 g by mouth daily as needed for moderate constipation.    potassium chloride SA (KLOR-CON) 20 MEQ tablet Take 20 mEq by mouth See admin instructions. Take as needed with furosemide   PRESCRIPTION MEDICATION Inhale into the lungs at bedtime. CPAP   valsartan (DIOVAN) 320 MG tablet TAKE 1 TABLET BY MOUTH EVERY DAY     Allergies:   Tizanidine, Amoxicillin, Metoprolol tartrate, and Oxaprozin   Social History   Socioeconomic History   Marital status: Married    Spouse name: Not on file   Number of children: Not on file   Years of education: Not on file   Highest education level: Not on file  Occupational History   Occupation: Retired  Tobacco Use   Smoking status: Never   Smokeless tobacco: Never  Vaping Use   Vaping Use: Never used  Substance and Sexual Activity   Alcohol use: Yes    Comment: rarely   Drug use: No   Sexual activity: Not Currently  Other Topics  Concern   Not on file  Social History Narrative   Lives with husband in [place].   Social Determinants of Health   Financial Resource Strain: Not on file  Food Insecurity: Not on file  Transportation Needs: Not on file  Physical Activity: Not on file  Stress: Not on file  Social Connections: Not on file     Family History: The patient's family history includes Diabetes in her brother; Heart attack in her mother; Heart failure in her maternal grandfather; Hypertension in her brother; Kidney disease in her brother.  ROS:   Please see the history of present illness.     EKGs/Labs/Other Studies Reviewed:    The following studies were reviewed today:  Echocardiogram 10/16/2018: Impressions:  1. Left ventricular ejection fraction, by visual estimation, is 60 to  65%. The left ventricle has normal function. Normal left ventricular size.  There is no left ventricular hypertrophy.   2. Global right ventricle has normal systolic function.The right  ventricular size is normal. No increase in right ventricular wall  thickness.   3. Left atrial size was severely dilated.   4. Right atrial size was normal.   5. Small pericardial effusion.   6. Mild calcification of the posterior mitral valve leaflet(s).   7. Moderate thickening of the posterior mitral valve leaflet(s).   8. Moderately decreased mobility of the mitral valve leaflets.   9. The mitral valve is rheumatic. Mild mitral valve regurgitation.  Moderate-severe mitral stenosis.  10. Mean transmitral gradient is 13mHg.      Viewed personally TEE from 2019 and value leaflets appear thin and non  calcified. Harmonics on current echocardiogram may be causing artifactual  thickening.  11. The tricuspid valve is normal in structure. Tricuspid valve  regurgitation was not visualized by color flow Doppler.  12. The aortic valve is normal in structure. Aortic valve regurgitation is  mild by color flow Doppler. Mild aortic valve  sclerosis without stenosis.  13. The pulmonic valve was normal in structure. Pulmonic valve  regurgitation is trivial by color flow Doppler.  14. Mildly elevated pulmonary artery systolic pressure.  15. The inferior vena cava is normal in size with greater than 50%  respiratory variability, suggesting right atrial pressure of 3 mmHg.  16. Rheumatic mitral valve stenosis. Transmitral gradient has increased.  Consider repeat TEE and stress echo to evaluate transmitral gradient  during exercise. May need a candidate for balloon valvuloplasty. _______________   Right/Left Cardiac Catheterization 10/18/2018: Hemodynamic findings consistent with mild pulmonary hypertension. LV end diastolic pressure is normal. There is severe mitral valve stenosis -suggested by a echocardiogram, large PCWP V waves along with elevated PCWP but normal LVEDP would also suggest this. Angiographically normal coronary arteries   Summary: Angiographically normal coronary arteries Mild pulmonary hypertension with mildly elevated PCWP but normal LVEDP suggestive of mitral stenosis Significant V wave noted on PCWP waveform suggesting mitral stenosis.   Recommendations: Return to nursing unit for ongoing care.   Proceed with plans for mitral valve replacement _______________   Zio Monitor 11/16/2018 to 11/29/2018: Max 148 bpm 05:47pm, 10/23 Min 25 bpm 08:55am, 10/28 Avg 62 bpm   Rare PACs and PVCs 3% atrial fibrillation/flutter burden 7 pauses, longest 4.3 seconds Symptoms associated with sinus rhythm, atrial flutter, and sinus bradycardia with pauses _______________   Echocardiogram 01/23/2019: Impressions:  1. Left ventricular ejection fraction, by visual estimation, is 55 to  60%. The left ventricle has normal function. Left ventricular septal wall  thickness was mildly increased. Moderately increased left ventricular  posterior wall thickness. There is  mildly increased left ventricular hypertrophy.    2. Left ventricular diastolic parameters are indeterminate.   3. The left ventricle demonstrates regional wall motion abnormalities.  4. Elevated LVEDP. Hypokinesis of the basal septum consistent with  post-operative state.   5. Global right ventricle has normal systolic function.The right  ventricular size is normal. No increase in right ventricular wall  thickness.   6. Left atrial size was severely dilated.   7. Right atrial size was normal.   8. The mitral valve has been repaired/replaced. No evidence of mitral  valve regurgitation. No evidence of mitral stenosis.   9. The tricuspid valve is normal in structure.  10. The aortic valve is tricuspid. Aortic valve regurgitation is mild. No  evidence of aortic valve sclerosis or stenosis.  11. The pulmonic valve was normal in structure. Pulmonic valve  regurgitation is not visualized.  12. Normal pulmonary artery systolic pressure.  13. The inferior vena cava is normal in size with <50% respiratory  variability, suggesting right atrial pressure of 8 mmHg.   In comparison to the previous echocardiogram(s): Bioprosthetic mitral  valve now present and functioning properly. Mean gradient 5 mmHg.  EKG:  EKG not ordered today.   Recent Labs: 01/18/2020: B Natriuretic Peptide 123.4; Hemoglobin 11.3; Platelets 191 05/14/2020: BUN 11; Creatinine, Ser 0.91; Magnesium 2.2; Potassium 4.3; Sodium 140; TSH 2.180  Recent Lipid Panel No results found for: CHOL, TRIG, HDL, CHOLHDL, VLDL, LDLCALC, LDLDIRECT  Physical Exam:    Vital Signs: BP (!) 142/60 (BP Location: Left Arm, Patient Position: Sitting, Cuff Size: Large)   Pulse 60   Ht 5' (1.524 m)   Wt 171 lb 3.2 oz (77.7 kg)   SpO2 99%   BMI 33.44 kg/m     Wt Readings from Last 3 Encounters:  08/14/20 171 lb 3.2 oz (77.7 kg)  05/14/20 166 lb 12.8 oz (75.7 kg)  05/05/20 169 lb 3.2 oz (76.7 kg)     General: 75 y.o. Caucasian female in no acute distress. HEENT: Normocephalic and  atraumatic. Sclera clear.  Neck: Supple. No JVD. Heart: RRR. Distinct S1 and S2. Soft murmur heard along left sternal border. No rubs or gallops. Lungs: No increased work of breathing. Clear to ausculation bilaterally. No wheezes, rhonchi, or rales.  Abdomen: Soft, non-distended, and non-tender to palpation.  Extremities: No lower extremity edema.    Skin: Warm and dry. Neuro: Alert and oriented x3. No focal deficits. Psych: Normal affect. Responds appropriately.   Assessment:    1. Persistent atrial fibrillation/flutter   2. Atypical chest pain   3. Chronic dyspnea   4. Mitral valve stenosis, unspecified etiology   5. S/P minimally-invasive mitral valve replacement with bioprosthetic valve   6. Chronic pericardial effusion   7. Primary hypertension   8. Hyperlipidemia, unspecified hyperlipidemia type     Plan:    Persistent Atrial Fibrillation/Flutter Possible SVT - S/p atrial fibrillation/flutter ablation in 02/2017 and repeat ablation in 05/2017 as well as MAZE procedure at time of valve surgery in 11/2018. Had episode of tachycardia (suspect atrial flutter) with rates in the 130s in 05/10/2020 after Coreg dose was decreased due to low resting heart rates. Coreg was increased again with improvement. No significant palpitations. - EKG not ordered today but sounds like in sinus on exam. Heart rates mostly in the 50s to 60s at home. Occasionally in the 40s but asymptomatic with this.  - Continue Coreg to 9.329m twice daily. - Continue Tikosyn 5049mtwice daily.   - Continue Cardizem 3087ms needed for palpitations.  - Continue chronic anticoagulation with Eliquis 5mg8mice daily.    Atypical Chest Pain - Patient reports occasional and random  chest aching on the eft side of her chest. Always occurs at rest. No exertional chest pain. She states this has been going on for a long time and is stable and not getting worse. - Normal coronaries on cardiac catheterization prior to valve  surgery in 2020. Does not sound like angina to me. No ischemic work-up necessary at this time.   Chronic Dyspnea - Overall stable.  - Euvolemic on exam. Don't think this is due to CHF. However, getting Echo for monitoring of mitral stenosis and can reevaluate LV function at that time.   Mitral Stenosis s/p MVR - History of minimally invasive bioprosthetic mitral valve replacement in 11/2018. - Echo in 12/2018 showed normal LVEF with no evidence of MR or MS. Mean gradient 5 mmHg. - Soft murmur noted on exam.  - Will update Echo.   Chronic Pericardial Effusion - History of chronic pericardial effusion. However, no mention of pericardial effusion on most recent Echo in 12/2018. - Updating Echo as above.   Hypertension - BP mildly elevated in the office at 142/60. Patient monitors BP regularly at home and keep a great BP/HR log. Systolic BP range from low 100s to 140s. Looks like BP is mostly around goal. - Continue Coreg 9.365m twice daily. - Continue Valsartan 3233mdaily - Continue PRN Hydralazine 2509BDor systolic BP >1>532  - Also takes PRN Lasix for weight gain and edema.   Hyperlipidemia - Most recent lipid panel from 06/2020: Total Cholesterol 141, Triglycerides 158, HDL 41, LDL 73. - Patient has wanted to focus on diet and exercise rather than starting a statin in the past. Confirmed today that this is what she continues to want to do.  - Labs followed by PCP.   Disposition: Follow up in 6 months with Dr. RaOval Linsey  Medication Adjustments/Labs and Tests Ordered: Current medicines are reviewed at length with the patient today.  Concerns regarding medicines are outlined above.  Orders Placed This Encounter  Procedures   ECHOCARDIOGRAM COMPLETE   No orders of the defined types were placed in this encounter.   Patient Instructions  Medication Instructions:  No Changes *If you need a refill on your cardiac medications before your next appointment, please call your  pharmacy*   Lab Work: No Labs` If you have labs (blood work) drawn today and your tests are completely normal, you will receive your results only by: My[facility]if you have MyChart) OR A paper copy in the mail If you have any lab test that is abnormal or we need to change your treatment, we will call you to review the results.   Testing/Procedures: [address] requested that you have an echocardiogram. Echocardiography is a painless test that uses sound waves to create images of your heart. It provides your doctor with information about the size and shape of your heart and how well your heart's chambers and valves are working. This procedure takes approximately one hour. There are no restrictions for this procedure.   Follow-Up: [facility] are our priority.  As part of our continuing mission to provide you with exceptional heart care, we have created designated Provider Care Teams.  These Care Teams include your primary Cardiologist (physician) and Advanced Practice Providers (APPs -  Physician Assistants and Nurse Practitioners) who all work together to provide you with the care you need, when you need it.  Your next appointment:   6 month(s)  The  format for your next appointment:   In Person  Provider:   Skeet Latch, MD       Signed, Darreld Mclean, PA-C  08/14/2020 5:42 PM    [facility]

## 2020-08-14 ENCOUNTER — Other Ambulatory Visit: Payer: Self-pay

## 2020-08-14 ENCOUNTER — Encounter: Payer: Self-pay | Admitting: Student

## 2020-08-14 ENCOUNTER — Ambulatory Visit (INDEPENDENT_AMBULATORY_CARE_PROVIDER_SITE_OTHER): Payer: Medicare Other | Admitting: Student

## 2020-08-14 VITALS — BP 142/60 | HR 60 | Ht 60.0 in | Wt 171.2 lb

## 2020-08-14 DIAGNOSIS — R0789 Other chest pain: Secondary | ICD-10-CM | POA: Diagnosis not present

## 2020-08-14 DIAGNOSIS — I10 Essential (primary) hypertension: Secondary | ICD-10-CM

## 2020-08-14 DIAGNOSIS — I050 Rheumatic mitral stenosis: Secondary | ICD-10-CM

## 2020-08-14 DIAGNOSIS — E785 Hyperlipidemia, unspecified: Secondary | ICD-10-CM

## 2020-08-14 DIAGNOSIS — R0609 Other forms of dyspnea: Secondary | ICD-10-CM

## 2020-08-14 DIAGNOSIS — I4819 Other persistent atrial fibrillation: Secondary | ICD-10-CM | POA: Diagnosis not present

## 2020-08-14 DIAGNOSIS — I3139 Other pericardial effusion (noninflammatory): Secondary | ICD-10-CM

## 2020-08-14 DIAGNOSIS — I313 Pericardial effusion (noninflammatory): Secondary | ICD-10-CM

## 2020-08-14 DIAGNOSIS — Z953 Presence of xenogenic heart valve: Secondary | ICD-10-CM

## 2020-08-14 NOTE — Patient Instructions (Signed)
Medication Instructions:  No Changes *If you need a refill on your cardiac medications before your next appointment, please call your pharmacy*   Lab Work: No Labs` If you have labs (blood work) drawn today and your tests are completely normal, you will receive your results only by: Helen (if you have MyChart) OR A paper copy in the mail If you have any lab test that is abnormal or we need to change your treatment, we will call you to review the results.   Testing/Procedures: [address], [place] has requested that you have an echocardiogram. Echocardiography is a painless test that uses sound waves to create images of your heart. It provides your doctor with information about the size and shape of your heart and how well your heart's chambers and valves are working. This procedure takes approximately one hour. There are no restrictions for this procedure.   Follow-Up: At [facility], you and your health needs are our priority.  As part of our continuing mission to provide you with exceptional heart care, we have created designated Provider Care Teams.  These Care Teams include your primary Cardiologist (physician) and Advanced Practice Providers (APPs -  Physician Assistants and Nurse Practitioners) who all work together to provide you with the care you need, when you need it.  Your next appointment:   6 month(s)  The format for your next appointment:   In Person  Provider:   Skeet Latch, MD

## 2020-08-19 ENCOUNTER — Other Ambulatory Visit: Payer: Self-pay

## 2020-08-19 MED ORDER — DOFETILIDE 500 MCG PO CAPS: 500.0000 ug | ORAL_CAPSULE | Freq: Two times a day (BID) | ORAL | 3 refills | Status: DC

## 2020-08-28 ENCOUNTER — Ambulatory Visit: Payer: Medicare Other | Admitting: Podiatry

## 2020-08-31 ENCOUNTER — Telehealth: Payer: Self-pay | Admitting: Physician Assistant

## 2020-08-31 NOTE — Telephone Encounter (Signed)
  Pt had tachycardia yesterday, took Dilt x 2 and HR normalized.  Today, pt again had tachycardia and Dilt 30 mg. Her HR normalized.  However, she still felt bad. Her Cartia showed possible afib, BP up and down, SBP 90s at times, 130s at times.  She also took her usual Coreg 9.375 mg this am. She has taken a second Dilt tablet.   She has not missed any doses of her Eliquis or her Tikosyn.  She has not had any recent illness or other problems.   Suggested that she continue current therapy, do not stop any meds.   F/u with echo appt.  I will route to staff.  She should see Afib clinic tomorrow. Tikosyn may no longer be working. Even when HR normal, she does not feel well in Afib.  Come to ER if sx worsen, for now, OK to continue meds as prescribed and wait till tomorrow.  [name], PA-C 08/31/2020 3:47 PM

## 2020-09-01 ENCOUNTER — Encounter (HOSPITAL_COMMUNITY): Payer: Self-pay

## 2020-09-01 NOTE — Telephone Encounter (Signed)
Spoke with patient. She went in and out of afib most of the weekend. She took a total of 4 cardizem today and is now back in NSR in the 60s. Shes under a tremendous amount of stress with her homelife and taking xanax this weekend actually helped.  She does not feel she needs to come into clinic but would like to see Dr. Curt Bears after having her echo later this week. Pt will call if issues arise prior to seeing Camnitz.

## 2020-09-04 ENCOUNTER — Other Ambulatory Visit (HOSPITAL_COMMUNITY): Payer: Medicare Other

## 2020-09-24 ENCOUNTER — Other Ambulatory Visit: Payer: Self-pay

## 2020-09-24 ENCOUNTER — Ambulatory Visit (HOSPITAL_COMMUNITY): Payer: Medicare Other | Attending: Student

## 2020-09-24 DIAGNOSIS — I313 Pericardial effusion (noninflammatory): Secondary | ICD-10-CM | POA: Diagnosis present

## 2020-09-24 DIAGNOSIS — I050 Rheumatic mitral stenosis: Secondary | ICD-10-CM | POA: Insufficient documentation

## 2020-09-24 DIAGNOSIS — I4819 Other persistent atrial fibrillation: Secondary | ICD-10-CM | POA: Insufficient documentation

## 2020-09-24 DIAGNOSIS — I10 Essential (primary) hypertension: Secondary | ICD-10-CM | POA: Insufficient documentation

## 2020-09-24 DIAGNOSIS — E785 Hyperlipidemia, unspecified: Secondary | ICD-10-CM | POA: Diagnosis present

## 2020-09-24 DIAGNOSIS — I3139 Other pericardial effusion (noninflammatory): Secondary | ICD-10-CM

## 2020-09-24 DIAGNOSIS — Z953 Presence of xenogenic heart valve: Secondary | ICD-10-CM | POA: Insufficient documentation

## 2020-09-24 LAB — ECHOCARDIOGRAM COMPLETE
AR max vel: 2.06 cm2
AV Area VTI: 2.29 cm2
AV Area mean vel: 2.1 cm2
AV Mean grad: 6.5 mmHg
AV Peak grad: 11.4 mmHg
Ao pk vel: 1.69 m/s
Area-P 1/2: 2.87 cm2
MV VTI: 2.13 cm2
S' Lateral: 3 cm

## 2020-09-30 ENCOUNTER — Other Ambulatory Visit: Payer: Self-pay

## 2020-09-30 ENCOUNTER — Ambulatory Visit (INDEPENDENT_AMBULATORY_CARE_PROVIDER_SITE_OTHER): Payer: Medicare Other | Admitting: Cardiology

## 2020-09-30 ENCOUNTER — Encounter: Payer: Self-pay | Admitting: Cardiology

## 2020-09-30 VITALS — BP 104/62 | HR 56 | Ht 60.0 in | Wt 168.6 lb

## 2020-09-30 DIAGNOSIS — I4819 Other persistent atrial fibrillation: Secondary | ICD-10-CM

## 2020-09-30 NOTE — Patient Instructions (Signed)
Medication Instructions:  Your physician has recommended you make the following change in your medication: STOP Carvedilol 3.125 mg twice a day -- but continue taking 6.25 mg twice daily  *If you need a refill on your cardiac medications before your next appointment, please call your pharmacy*   Lab Work: None ordered   Testing/Procedures: None ordered   Follow-Up: At [facility], you and your health needs are our priority.  As part of our continuing mission to provide you with exceptional heart care, we have created designated Provider Care Teams.  These Care Teams include your primary Cardiologist (physician) and Advanced Practice Providers (APPs -  Physician Assistants and Nurse Practitioners) who all work together to provide you with the care you need, when you need it.  Your next appointment:   6 month(s)  The format for your next appointment:   In Person  Provider:   Allegra Lai, MD    Thank you for choosing [facility]!!   Trinidad Curet, RN [phone]

## 2020-09-30 NOTE — Progress Notes (Signed)
Electrophysiology Office Note   Date:  09/30/2020   ID:  Mary Farrell, DOB September 24, 1945, MRN [id number]  PCP:  Lawerance Cruel, MD  Cardiologist:  Oval Linsey Primary Electrophysiologist:  Nahmir Zeidman Meredith Leeds, MD    No chief complaint on file.    History of Present Illness: Mary Farrell is a 75 y.o. female who is being seen today for the evaluation of atrial fibrillation at the request of Lawerance Cruel, MD. Presenting today for electrophysiology evaluation.   She has a history significant for moderate aortic regurgitation, mild to moderate mitral stenosis, pericardial effusion, paroxysmal atrial flutter/fibrillation, and hypertension.  She is status post atrial fibrillation/flutter ablation 03/10/2017 with repeat ablation 06/13/2017.  She is now status post surgical MVR and maze November 2020.  She is currently on dofetilide that she has had multiple atrial arrhythmias.  Today, denies symptoms of palpitations, chest pain, shortness of breath, orthopnea, PND, lower extremity edema, claudication, dizziness, presyncope, syncope, bleeding, or neurologic sequela. The patient is tolerating medications without difficulties.  Since being seen she has done well.  She has had no further episodes of atrial fibrillation.  Unfortunately she has been having episodes of vertigo and has been going to physical therapy.  She is also noted that her blood pressure is low.  She feels somewhat dizzy and fatigued when her blood pressure is low.  She is on both valsartan and carvedilol.   Past Medical History:  Diagnosis Date   Anxiety 06/29/2012   Aortic regurgitation 08/13/2015   Arthritis    "hands, wrists, back, feet, toes" (06/24/2017)   Atrial flutter ([place]) 09/18/2015   Atypical chest pain 05/13/2016   Chest pain    remote cath in 1996 with NORMAL coronaries noted   CHF (congestive heart failure) ([place])    Dyspnea 10/30/2010   Essential hypertension 09/05/2013   Exogenous obesity    history of    GERD  (gastroesophageal reflux disease)    Glaucoma, both eyes    Headache, migraine    "stopped in my 31's" (09/18/2015)   History of cardiovascular stress test 2007   showing no ischemia   Hypertension    Mitral stenosis and aortic insufficiency 06/23/2010   Mitral stenosis with insufficiency    Mitral stenosis with regurgitation    Murmur    of mitral stenosis and moderate aortic insufficiency       Nausea 05/01/2018   OSA on CPAP    Palpitations    occasional   Paroxysmal A-fib ([place]) 06/24/2017   Pericardial effusion 09/18/2015   Persistent atrial fibrillation ([place]) 03/10/2017   Personal history of rheumatic heart disease    S/P minimally-invasive maze operation for atrial fibrillation 12/13/2018   Complete bilateral atrial lesion set using cryothermy and bipolar radiofrequency ablation with clipping of LA appendage via right mini-thoracotomy approach   S/P minimally-invasive mitral valve replacement with bioprosthetic valve 12/13/2018   31 mm [facility] Mitral stented bovine pericardial tissue valve   SBE (subacute bacterial endocarditis)    prophylaxis, patient unaware   Sliding hiatal hernia    Vertigo 05/01/2018   Past Surgical History:  Procedure Laterality Date   [place] N/A 03/10/2017   Procedure: ATRIAL FIBRILLATION ABLATION;  Surgeon: [name], MD;  Location: [place] [facility];  Service: Cardiovascular;  Laterality: N/A;   ATRIAL FIBRILLATION ABLATION  06/24/2017   ATRIAL FIBRILLATION ABLATION N/A 06/24/2017   Procedure: ATRIAL FIBRILLATION ABLATION;  Surgeon: [name], MD;  Location: [place] [facility];  Service: Cardiovascular;  Laterality: N/A;   BUNIONECTOMY Right 2000s   CARDIAC CATHETERIZATION  01/13/1995   normal coronary anatomy and mild mitral stenosis and mild pulmonary hypertension   CARDIOVERSION N/A 09/22/2015   Procedure: CARDIOVERSION;  Surgeon: Jerline Pain, MD;   Location: [place];  Service: Cardiovascular;  Laterality: N/A;   CARDIOVERSION N/A 10/25/2016   Procedure: CARDIOVERSION;  Surgeon: Fay Records, MD;  Location: [place];  Service: Cardiovascular;  Laterality: N/A;   CARDIOVERSION N/A 04/15/2017   Procedure: CARDIOVERSION;  Surgeon: Josue Hector, MD;  Location: [facility] ENDOSCOPY;  Service: Cardiovascular;  Laterality: N/A;   CARDIOVERSION N/A 05/16/2017   Procedure: CARDIOVERSION;  Surgeon: Pixie Casino, MD;  Location: [facility] ENDOSCOPY;  Service: Cardiovascular;  Laterality: N/A;   CARDIOVERSION N/A 01/24/2019   Procedure: CARDIOVERSION;  Surgeon: Pixie Casino, MD;  Location: [facility] ENDOSCOPY;  Service: Cardiovascular;  Laterality: N/A;   CATARACT EXTRACTION W/ INTRAOCULAR LENS  IMPLANT, BILATERAL Bilateral 2013   COLONOSCOPY  2013   EYE SURGERY Bilateral    cataracts   LAPAROSCOPIC CHOLECYSTECTOMY  1997   MINIMALLY INVASIVE MAZE PROCEDURE N/A 12/13/2018   Procedure: MINIMALLY INVASIVE MAZE PROCEDURE using a 45 MM AtriClip.;  Surgeon: Rexene Alberts, MD;  Location: [place];  Service: Open Heart Surgery;  Laterality: N/A;   MITRAL VALVE REPLACEMENT Right 12/13/2018   Procedure: MINIMALLY INVASIVE MITRAL VALVE (MV) REPLACEMENT using a Magna Mitral Ease 31 MM Valve.;  Surgeon: Rexene Alberts, MD;  Location: [place];  Service: Open Heart Surgery;  Laterality: Right;   RIGHT/LEFT HEART CATH AND CORONARY ANGIOGRAPHY N/A 10/18/2018   Procedure: RIGHT/LEFT HEART CATH AND CORONARY ANGIOGRAPHY;  Surgeon: Leonie Man, MD;  Location: [place] [facility];  Service: Cardiovascular;  Laterality: N/A;   TEE WITHOUT CARDIOVERSION N/A 06/23/2017   Procedure: TRANSESOPHAGEAL ECHOCARDIOGRAM (TEE);  Surgeon: Fay Records, MD;  Location: [place];  Service: Cardiovascular;  Laterality: N/A;   TEE WITHOUT CARDIOVERSION N/A 10/17/2018   Procedure: TRANSESOPHAGEAL ECHOCARDIOGRAM (TEE);  Surgeon: Jerline Pain, MD;  Location: [facility] ENDOSCOPY;  Service:  Cardiovascular;  Laterality: N/A;   TEE WITHOUT CARDIOVERSION N/A 12/13/2018   Procedure: TRANSESOPHAGEAL ECHOCARDIOGRAM (TEE);  Surgeon: Rexene Alberts, MD;  Location: [place];  Service: Open Heart Surgery;  Laterality: N/A;   TONSILLECTOMY AND ADENOIDECTOMY  1990s   UVULOPALATOPHARYNGOPLASTY, TONSILLECTOMY AND SEPTOPLASTY  1990s     Current Outpatient Medications  Medication Sig Dispense Refill   acetaminophen (TYLENOL) 500 MG tablet Take 1,000 mg by mouth every 6 (six) hours as needed for moderate pain or headache.      ALPRAZolam (XANAX) 0.25 MG tablet Take 0.125 mg by mouth 2 (two) times daily as needed for anxiety.      Calcium Citrate-Vitamin D (CALCIUM CITRATE + D3) 200-250 MG-UNIT TABS Take 1 tablet by mouth daily with supper.     carvedilol (COREG) 6.25 MG tablet Take 1 tablet (6.25 mg total) by mouth 2 (two) times daily. 180 tablet 3   cetirizine (ZYRTEC) 10 MG tablet Take 10 mg by mouth at bedtime.      Cholecalciferol (VITAMIN D) 2000 UNITS CAPS Take 2,000 Units by mouth daily.     conjugated estrogens (PREMARIN) vaginal cream Place 1 Applicatorful vaginally daily as needed (irritation).      Cyanocobalamin (VITAMIN B12) 1000 MCG TBCR Take 1,000 mcg by mouth daily.     diltiazem (CARDIZEM) 30 MG tablet  Take 1 tablet (30 mg total) by mouth 4 (four) times daily as needed (for heart rates greater than 100). 30 tablet 1   dofetilide (TIKOSYN) 500 MCG capsule Take 1 capsule (500 mcg total) by mouth 2 (two) times daily. 180 capsule 3   dorzolamide-timolol (COSOPT) 22.3-6.8 MG/ML ophthalmic solution Place 1 drop into both eyes daily.     ELIQUIS 5 MG TABS tablet TAKE 1 TABLET BY MOUTH  TWICE DAILY 180 tablet 1   famotidine (PEPCID) 20 MG tablet Take 20 mg by mouth at bedtime.     fluticasone (FLONASE) 50 MCG/ACT nasal spray Place 1 spray into both nostrils daily as needed for allergies.     furosemide (LASIX) 40 MG tablet Take 20 mg by mouth daily as needed for fluid.     gabapentin  (NEURONTIN) 300 MG capsule Take 300 mg by mouth 3 (three) times daily.      hydrALAZINE (APRESOLINE) 25 MG tablet TAKE 1 TABLET DAILY AS NEEDED FOR BLOOD PRESSURE ABOVE 140 30 tablet 5   latanoprost (XALATAN) 0.005 % ophthalmic solution Place 1 drop into both eyes at bedtime.   6   magnesium oxide (MAG-OX) 400 MG tablet Take 0.5 tablets (200 mg total) by mouth daily. 90 tablet 1   Multiple Vitamin (MULTIVITAMIN WITH MINERALS) TABS tablet Take 1 tablet by mouth daily.     NON FORMULARY Apply 1 application topically 2 (two) times daily as needed (for eczema). Traimcinolone/[employer] Moist Cream     omeprazole (PRILOSEC) 40 MG capsule Take 40 mg by mouth daily.     oxybutynin (DITROPAN-XL) 10 MG 24 hr tablet Take 10 mg by mouth daily.     polyethylene glycol (MIRALAX / GLYCOLAX) 17 g packet Take 17 g by mouth daily as needed for moderate constipation.      potassium chloride SA (KLOR-CON) 20 MEQ tablet Take 20 mEq by mouth See admin instructions. Take as needed with furosemide     PRESCRIPTION MEDICATION Inhale into the lungs at bedtime. CPAP     valsartan (DIOVAN) 320 MG tablet TAKE 1 TABLET BY MOUTH EVERY DAY 90 tablet 1   No current facility-administered medications for this visit.    Allergies:   Tizanidine, Amoxicillin, Metoprolol tartrate, and Oxaprozin   Social History:  The patient  reports that she has never smoked. She has never used smokeless tobacco. She reports current alcohol use. She reports that she does not use drugs.   Family History:  The patient's family history includes Diabetes in her brother; Heart attack in her mother; Heart failure in her maternal grandfather; Hypertension in her brother; Kidney disease in her brother.   ROS:  Please see the history of present illness.   Otherwise, review of systems is positive for none.   All other systems are reviewed and negative.   PHYSICAL EXAM: VS:  BP 104/62   Pulse (!) 56   Ht 5' (1.524 m)   Wt 168 lb 9.6 oz (76.5 kg)   SpO2 98%    BMI 32.93 kg/m  , BMI Body mass index is 32.93 kg/m. GEN: Well nourished, well developed, in no acute distress  HEENT: normal  Neck: no JVD, carotid bruits, or masses Cardiac: RRR; no murmurs, rubs, or gallops,no edema  Respiratory:  clear to auscultation bilaterally, normal work of breathing GI: soft, nontender, nondistended, + BS MS: no deformity or atrophy  Skin: warm and dry Neuro:  Strength and sensation are intact Psych: euthymic mood, full affect  EKG:  EKG  is ordered today. Personal review of the ekg ordered shows sinus rhythm, rate 56  Recent Labs: 01/18/2020: B Natriuretic Peptide 123.4; Hemoglobin 11.3; Platelets 191 05/14/2020: BUN 11; Creatinine, Ser 0.91; Magnesium 2.2; Potassium 4.3; Sodium 140; TSH 2.180    Lipid Panel  No results found for: CHOL, TRIG, HDL, CHOLHDL, VLDL, LDLCALC, LDLDIRECT   Wt Readings from Last 3 Encounters:  09/30/20 168 lb 9.6 oz (76.5 kg)  08/14/20 171 lb 3.2 oz (77.7 kg)  05/14/20 166 lb 12.8 oz (75.7 kg)      Other studies Reviewed: Additional studies/ records that were reviewed today include: TTE 01/23/19  Review of the above records today demonstrates:   1. Left ventricular ejection fraction, by visual estimation, is 55 to  60%. The left ventricle has normal function. Left ventricular septal wall  thickness was mildly increased. Moderately increased left ventricular  posterior wall thickness. There is  mildly increased left ventricular hypertrophy.   2. Left ventricular diastolic parameters are indeterminate.   3. The left ventricle demonstrates regional wall motion abnormalities.   4. Elevated LVEDP. Hypokinesis of the basal septum consistent with  post-operative state.   5. Global right ventricle has normal systolic function.The right  ventricular size is normal. No increase in right ventricular wall  thickness.   6. Left atrial size was severely dilated.   7. Right atrial size was normal.   8. The mitral valve has been  repaired/replaced. No evidence of mitral  valve regurgitation. No evidence of mitral stenosis.   9. The tricuspid valve is normal in structure.  10. The aortic valve is tricuspid. Aortic valve regurgitation is mild. No  evidence of aortic valve sclerosis or stenosis.  11. The pulmonic valve was normal in structure. Pulmonic valve  regurgitation is not visualized.  12. Normal pulmonary artery systolic pressure.  13. The inferior vena cava is normal in size with <50% respiratory  variability, suggesting right atrial pressure of 8 mmHg.    ASSESSMENT AND PLAN:  1.  Persistent atrial fibrillation/flutter: Status post ablation 03/10/2017 with repeat ablation 06/24/2017.  CHA2DS2-VASc of 3.  Currently on Coumadin,dofetilide 500 mcg twice daily, carvedilol.  High risk medication monitoring via ECG and labs.  She is fortunately remained in sinus rhythm.  She has noted no further episodes of atrial fibrillation.  2.  Pericardial effusion: Now postop.  Currently.  Never had tamponade.  3.  Hypertension: Low today.  She brings in blood pressure recordings that are also low.  She is on carvedilol 9.375 mg.  We Marilin Kofman reduce to 6.25 mg twice daily.  4.  Mitral stenosis: Status post MVR with bioprosthetic mitral valve and maze.  Stable on most recent echo.  No changes at this time.    Current medicines are reviewed at length with the patient today.   The patient does not have concerns regarding her medicines.  The following changes were made today: Decrease carvedilol  Labs/ tests ordered today include:  Orders Placed This Encounter  Procedures   EKG 12-Lead      Disposition:   FU with Sheril Hammond 6 months  Signed, Emersen [place] Meredith Leeds, MD  09/30/2020 11:33 AM     [place] [address] [phone] (office) [phone] (fax)

## 2020-10-14 ENCOUNTER — Other Ambulatory Visit: Payer: Self-pay | Admitting: Cardiovascular Disease

## 2020-10-14 NOTE — Telephone Encounter (Signed)
Prescription refill request for Eliquis received. Indication:afib Last office visit:camnitz 09/30/20 Scr: 0.88 07/14/20 Age: 33f Weight:76.5kg

## 2020-11-04 ENCOUNTER — Encounter (HOSPITAL_BASED_OUTPATIENT_CLINIC_OR_DEPARTMENT_OTHER): Payer: Self-pay

## 2020-11-05 MED ORDER — FUROSEMIDE 20 MG PO TABS: 20.0000 mg | ORAL_TABLET | Freq: Every day | ORAL | 4 refills | Status: DC | PRN

## 2020-11-23 ENCOUNTER — Other Ambulatory Visit: Payer: Self-pay | Admitting: Cardiovascular Disease

## 2020-11-24 NOTE — Telephone Encounter (Signed)
Rx(s) sent to pharmacy electronically.  

## 2020-12-16 ENCOUNTER — Encounter (HOSPITAL_BASED_OUTPATIENT_CLINIC_OR_DEPARTMENT_OTHER): Payer: Self-pay | Admitting: Cardiovascular Disease

## 2021-01-02 ENCOUNTER — Other Ambulatory Visit: Payer: Self-pay | Admitting: Physician Assistant

## 2021-01-08 ENCOUNTER — Telehealth (HOSPITAL_BASED_OUTPATIENT_CLINIC_OR_DEPARTMENT_OTHER): Payer: Self-pay | Admitting: Cardiovascular Disease

## 2021-01-08 NOTE — Telephone Encounter (Signed)
° °  Pre-operative Risk Assessment    Patient Name: Mary Farrell  DOB: [birth year] MRN: [id number]      Request for Surgical Clearance    Procedure:   Endoscopy  Date of Surgery:  Clearance 02/05/21                                 Surgeon:  Dr. Watt Climes Surgeon's Group or Practice Name:  [facility] Phone number:  [phone] Fax number:  [phone]   Type of Clearance Requested:   - Medical  - Pharmacy:  Hold Apixaban (Eliquis) 2 days prior to procedure   Type of Anesthesia:   Propofol   Additional requests/questions:   None  Barbaraann Faster   01/08/2021, 3:33 PM

## 2021-01-08 NOTE — Telephone Encounter (Signed)
Will route to pharm for input on anticoag, last OV 09/2020 with Dr. Curt Bears.

## 2021-01-09 NOTE — Telephone Encounter (Signed)
Await on clinical pharmacist to review

## 2021-01-12 NOTE — Telephone Encounter (Signed)
° °  Primary Cardiologist: Skeet Latch, MD  Chart reviewed as part of pre-operative protocol coverage. Given past medical history and time since last visit, based on ACC/AHA guidelines, Mary Farrell would be at acceptable risk for the planned procedure without further cardiovascular testing.   Patient with diagnosis of atrial fibrillation on Eliquis for anticoagulation.     Procedure: endoscopy Date of procedure: 02/05/21     CHA2DS2-VASc Score = 4   This indicates a 4.8% annual risk of stroke. The patient's score is based upon: CHF History: 0 HTN History: 1 Diabetes History: 0 Stroke History: 0 Vascular Disease History: 0 Age Score: 2 Gender Score: 1     CrCl 49 Platelet count 191.   Per office protocol, patient can hold Eliquis for 2 days prior to procedure.   Patient will not need bridging with Lovenox (enoxaparin) around procedure. Please resume Eliquis as soon as hemostasis is achieved, at discretion of surgeon.     Patient was advised that if she develops new symptoms prior to surgery to contact our office to arrange a follow-up appointment.  She verbalized understanding.  I will route this recommendation to the requesting party via Epic fax function and remove from pre-op pool.  Please call with questions.  Lenna Sciara, NP 01/12/2021, 2:33 PM

## 2021-01-12 NOTE — Telephone Encounter (Signed)
Patient with diagnosis of atrial fibrillation on Eliquis for anticoagulation.    Procedure: endoscopy Date of procedure: 02/05/21   CHA2DS2-VASc Score = 4   This indicates a 4.8% annual risk of stroke. The patient's score is based upon: CHF History: 0 HTN History: 1 Diabetes History: 0 Stroke History: 0 Vascular Disease History: 0 Age Score: 2 Gender Score: 1   CrCl 49 Platelet count 191.  Per office protocol, patient can hold Eliquis for 2 days prior to procedure.   Patient will not need bridging with Lovenox (enoxaparin) around procedure.

## 2021-02-13 ENCOUNTER — Other Ambulatory Visit: Payer: Self-pay

## 2021-02-13 ENCOUNTER — Ambulatory Visit (INDEPENDENT_AMBULATORY_CARE_PROVIDER_SITE_OTHER): Payer: Medicare Other | Admitting: Cardiovascular Disease

## 2021-02-13 ENCOUNTER — Encounter (HOSPITAL_BASED_OUTPATIENT_CLINIC_OR_DEPARTMENT_OTHER): Payer: Self-pay | Admitting: Cardiovascular Disease

## 2021-02-13 DIAGNOSIS — I10 Essential (primary) hypertension: Secondary | ICD-10-CM

## 2021-02-13 DIAGNOSIS — I495 Sick sinus syndrome: Secondary | ICD-10-CM | POA: Diagnosis not present

## 2021-02-13 DIAGNOSIS — I3139 Other pericardial effusion (noninflammatory): Secondary | ICD-10-CM

## 2021-02-13 DIAGNOSIS — I471 Supraventricular tachycardia, unspecified: Secondary | ICD-10-CM

## 2021-02-13 DIAGNOSIS — Z953 Presence of xenogenic heart valve: Secondary | ICD-10-CM

## 2021-02-13 DIAGNOSIS — E669 Obesity, unspecified: Secondary | ICD-10-CM | POA: Diagnosis not present

## 2021-02-13 DIAGNOSIS — E66811 Obesity, class 1: Secondary | ICD-10-CM

## 2021-02-13 DIAGNOSIS — F439 Reaction to severe stress, unspecified: Secondary | ICD-10-CM

## 2021-02-13 DIAGNOSIS — I4819 Other persistent atrial fibrillation: Secondary | ICD-10-CM

## 2021-02-13 HISTORY — DX: Sick sinus syndrome: I49.5

## 2021-02-13 HISTORY — DX: Reaction to severe stress, unspecified: F43.9

## 2021-02-13 HISTORY — DX: Obesity, unspecified: E66.9

## 2021-02-13 HISTORY — DX: Obesity, class 1: E66.811

## 2021-02-13 NOTE — Assessment & Plan Note (Signed)
S/P MAZE.  Currently in sinus rhythm.  Continue carvedilol, dofetilide and Eliquis.  She follows up with Dr. Curt Bears.

## 2021-02-13 NOTE — Patient Instructions (Signed)
Medication Instructions:  Your physician recommends that you continue on your current medications as directed. Please refer to the Current Medication list given to you today.   *If you need a refill on your cardiac medications before your next appointment, please call your pharmacy*  Lab Work: NONE  Testing/Procedures: NONE  Follow-Up: At [facility], you and your health needs are our priority.  As part of our continuing mission to provide you with exceptional heart care, we have created designated Provider Care Teams.  These Care Teams include your primary Cardiologist (physician) and Advanced Practice Providers (APPs -  Physician Assistants and Nurse Practitioners) who all work together to provide you with the care you need, when you need it.  We recommend signing up for the patient portal called "MyChart".  Sign up information is provided on this After Visit Summary.  MyChart is used to connect with patients for Virtual Visits (Telemedicine).  Patients are able to view lab/test results, encounter notes, upcoming appointments, etc.  Non-urgent messages can be sent to your provider as well.   To learn more about what you can do with MyChart, go to [email or web address].    Your next appointment:   6 month(s)  The format for your next appointment:   In Person  Provider:   Tiffany , MD            

## 2021-02-13 NOTE — Assessment & Plan Note (Signed)
She has struggled with stress eating.  She doesn't know what to eat, especially now that she is pre-diabetic.  We will have her see a nutritionist.  She will also work on increasing her exercise.

## 2021-02-13 NOTE — Assessment & Plan Note (Signed)
Resolved

## 2021-02-13 NOTE — Progress Notes (Signed)
Cardiology Office Note    Date:  02/13/2021   ID:  Anistyn, Graddy July 26, 1945, MRN [id number]  [place] Location: Home Provider Location: Office/Clinic  PCP:  Lawerance Cruel, MD  Cardiologist:  Skeet Latch, MD  Electrophysiologist:  [name], MD   Evaluation Performed:  Follow-Up Visit  Chief Complaint:  hyertension  History of Present Illness:     Mary Farrell does not have symptoms concerning for COVID-19 infection (fever, chills, cough, or new shortness of breath).    History of Present Illness: Mary Farrell is a 76 y.o. female with moderate aortic regurgitation, Rheumatic mitral stenosis s/p MVR, chronic pericardial effusion, paroxysmal atrial flutter s/p ablation 05/2017, MAZE 11/2018 and hypertension who presents for follow up.  Mary Farrell was previously a Farrell of Dr. Mare Ferrari.  Mary Farrell followed up with Truitt Merle on 04/2015 due to shortness of breath that Mary Farrell felt was getting worse.  Mary Farrell was referred for an echo 04/2015 that revealed LVEF 55-60% with moderate AR and mild-moderate MS.  There was also a moderate pericardial effusion but no evidence of tamponade.  Mary Farrell had a heart cath 12/1994 that revealed normal coronaries with mild MS and mild pulmonary hypertension.  Mary Farrell also had a Cardiolite in 2007 that was negative for ischemia.  Mary Farrell was seen in clinic 07/2015 and reported persistent shortness of breath with minimal exertion and chest discomfort.  Mary Farrell was referred for exercise Myoview 08/26/15 that revealed LVEF 60% and no perfusion defects.    Mary Farrell was admitted 08/2015 with atrial flutter.  Mary Farrell had an echo that showed a persistent moderate-severe pericardial effusion but no tamponade.  Mary Farrell was started on amiodarone, which Mary Farrell did not tolerate due to nausea.  Mary Farrell underwent DCCV on 09/22/15 and then was started on flecainide.  Mary Farrell continued to report shortness of breath and was again referred for an echo that revealed LVEF 60-65% with grade 2 diastolic  dysfunction, mild aortic regurgitation, and moderate mitral stenosis. Mary Farrell had a moderate pericardial effusion localized to Mary inferior and inferolateral walls. There was no evidence of tamponade.  Mary Farrell had an outpatient DCCV on 10/1 and converted with one shock at [facility].  Mary Farrell developed recurrent atrial fibrillation and was referred to EP.  Mary Farrell underwent afib/flutter ablation with Dr. Curt Bears on 06/24/17. Mary Farrell was started on flecainide but had recurent disease and was started on Tikosyn.  Her mitral valve was replaced 11/2018 (31 mm [facility] bioprosthetic).  Mary Farrell had a MAZE at that time. Mary Farrell later was Dr. Curt Bears and was in a junctional rhythm so diltiazem was discontinued.  Mary Farrell had recurrent episode of atrial fibrillation and took a dose of diltiazem Mary Farrell subsequently converted back to sinus rhythm.  Mary Farrell blood pressure was elevated so Mary Farrell switched from losartan to valsartan.  Mary Farrell also had chest pain that was thought to be atypical, especially in setting of normal coronaries prior to her surgery.  It was thought that her symptoms may be related to GERD and her PPI was increased.  Hydralazine was added for blood pressures over 140.  Mary Farrell saw Dr. Curt Bears 04/2020 and carvedilol was reduced due to bradycardia and fatigue.  Later that month Mary Farrell followed up with Sande Rives, PA for episodes of persistent tachycardia in Mary 140s.  Mary Farrell reviewed a cardia mobile strip and was thought to have been in atrial flutter.  Carvedilol was increased to 9.375 mg twice daily.  Mary Farrell followed up later that summer and was feeling better but dealing  with a lot of stressors, including her husband's dementia diagnosis.  Mary Farrell hasn't had any issues with her heart rhythm since June. Mary Farrell hasn't needed any diltiazem.  Mary Farrell has been stress eating due to thinking about her husband. He does well during Mary day but has problems with sundowning.   Past Medical History:  Diagnosis Date   Anxiety 06/29/2012   Aortic regurgitation  08/13/2015   Arthritis    "hands, wrists, back, feet, toes" (06/24/2017)   Atrial flutter ([place]) 09/18/2015   Atypical chest pain 05/13/2016   Chest pain    remote cath in 1996 with NORMAL coronaries noted   CHF (congestive heart failure) ([place])    Dyspnea 10/30/2010   Essential hypertension 09/05/2013   Exogenous obesity    history of    GERD (gastroesophageal reflux disease)    Glaucoma, both eyes    Headache, migraine    "stopped in my 54's" (09/18/2015)   History of cardiovascular stress test 2007   showing no ischemia   Hypertension    Mitral stenosis and aortic insufficiency 06/23/2010   Mitral stenosis with insufficiency    Mitral stenosis with regurgitation    Murmur    of mitral stenosis and moderate aortic insufficiency       Nausea 05/01/2018   Obesity (BMI 30.0-34.9) 02/13/2021   OSA on CPAP    Palpitations    occasional   Paroxysmal A-fib ([place]) 06/24/2017   Pericardial effusion 09/18/2015   Persistent atrial fibrillation ([place]) 03/10/2017   Personal history of rheumatic heart disease    S/P minimally-invasive maze operation for atrial fibrillation 12/13/2018   Complete bilateral atrial lesion set using cryothermy and bipolar radiofrequency ablation with clipping of LA appendage via right mini-thoracotomy approach   S/P minimally-invasive mitral valve replacement with bioprosthetic valve 12/13/2018   31 mm [facility] Mitral stented bovine pericardial tissue valve   SBE (subacute bacterial endocarditis)    prophylaxis, Farrell unaware   Sliding hiatal hernia    Stress 02/13/2021   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   Vertigo 05/01/2018    Past Surgical History:  Procedure Laterality Date   ABDOMINAL HYSTERECTOMY  1975   APPENDECTOMY  1977   ATRIAL FIBRILLATION ABLATION N/A 03/10/2017   Procedure: ATRIAL FIBRILLATION ABLATION;  Surgeon: [name], MD;  Location: [place] CV LAB;  Service: Cardiovascular;  Laterality: N/A;   ATRIAL FIBRILLATION  ABLATION  06/24/2017   ATRIAL FIBRILLATION ABLATION N/A 06/24/2017   Procedure: ATRIAL FIBRILLATION ABLATION;  Surgeon: [name], MD;  Location: [place] [facility];  Service: Cardiovascular;  Laterality: N/A;   BUNIONECTOMY Right 2000s   CARDIAC CATHETERIZATION  01/13/1995   normal coronary anatomy and mild mitral stenosis and mild pulmonary hypertension   CARDIOVERSION N/A 09/22/2015   Procedure: CARDIOVERSION;  Surgeon: Jerline Pain, MD;  Location: [facility] ENDOSCOPY;  Service: Cardiovascular;  Laterality: N/A;   CARDIOVERSION N/A 10/25/2016   Procedure: CARDIOVERSION;  Surgeon: Fay Records, MD;  Location: [place];  Service: Cardiovascular;  Laterality: N/A;   CARDIOVERSION N/A 04/15/2017   Procedure: CARDIOVERSION;  Surgeon: Josue Hector, MD;  Location: [facility] ENDOSCOPY;  Service: Cardiovascular;  Laterality: N/A;   CARDIOVERSION N/A 05/16/2017   Procedure: CARDIOVERSION;  Surgeon: Pixie Casino, MD;  Location: [facility] ENDOSCOPY;  Service: Cardiovascular;  Laterality: N/A;   CARDIOVERSION N/A 01/24/2019   Procedure: CARDIOVERSION;  Surgeon: Pixie Casino, MD;  Location: [place];  Service: Cardiovascular;  Laterality: N/A;   CATARACT EXTRACTION W/ INTRAOCULAR LENS  IMPLANT, BILATERAL Bilateral 2013   COLONOSCOPY  2013   EYE SURGERY Bilateral    cataracts   LAPAROSCOPIC CHOLECYSTECTOMY  1997   MINIMALLY INVASIVE MAZE PROCEDURE N/A 12/13/2018   Procedure: MINIMALLY INVASIVE MAZE PROCEDURE using a 45 MM AtriClip.;  Surgeon: Rexene Alberts, MD;  Location: [place];  Service: Open Heart Surgery;  Laterality: N/A;   MITRAL VALVE REPLACEMENT Right 12/13/2018   Procedure: MINIMALLY INVASIVE MITRAL VALVE (MV) REPLACEMENT using a Magna Mitral Ease 31 MM Valve.;  Surgeon: Rexene Alberts, MD;  Location: [place];  Service: Open Heart Surgery;  Laterality: Right;   RIGHT/LEFT HEART CATH AND CORONARY ANGIOGRAPHY N/A 10/18/2018   Procedure: RIGHT/LEFT HEART CATH AND CORONARY ANGIOGRAPHY;   Surgeon: Leonie Man, MD;  Location: [place] CV [facility];  Service: Cardiovascular;  Laterality: N/A;   TEE WITHOUT CARDIOVERSION N/A 06/23/2017   Procedure: TRANSESOPHAGEAL ECHOCARDIOGRAM (TEE);  Surgeon: Fay Records, MD;  Location: [place];  Service: Cardiovascular;  Laterality: N/A;   TEE WITHOUT CARDIOVERSION N/A 10/17/2018   Procedure: TRANSESOPHAGEAL ECHOCARDIOGRAM (TEE);  Surgeon: Jerline Pain, MD;  Location: [facility] ENDOSCOPY;  Service: Cardiovascular;  Laterality: N/A;   TEE WITHOUT CARDIOVERSION N/A 12/13/2018   Procedure: TRANSESOPHAGEAL ECHOCARDIOGRAM (TEE);  Surgeon: Rexene Alberts, MD;  Location: [place];  Service: Open Heart Surgery;  Laterality: N/A;   TONSILLECTOMY AND ADENOIDECTOMY  1990s   UVULOPALATOPHARYNGOPLASTY, TONSILLECTOMY AND SEPTOPLASTY  1990s     Current Outpatient Medications  Medication Sig Dispense Refill   acetaminophen (TYLENOL) 500 MG tablet Take 1,000 mg by mouth every 6 (six) hours as needed for moderate pain or headache.      ALPRAZolam (XANAX) 0.25 MG tablet Take 0.125 mg by mouth 2 (two) times daily as needed for anxiety.      Calcium Citrate-Vitamin D (CALCIUM CITRATE + D3) 200-250 MG-UNIT TABS Take 1 tablet by mouth daily with supper.     carvedilol (COREG) 6.25 MG tablet Take 1 tablet (6.25 mg total) by mouth 2 (two) times daily. 180 tablet 3   cetirizine (ZYRTEC) 10 MG tablet Take 10 mg by mouth at bedtime.      Cholecalciferol (VITAMIN D) 2000 UNITS CAPS Take 2,000 Units by mouth daily.     conjugated estrogens (PREMARIN) vaginal cream Place 1 Applicatorful vaginally daily as needed (irritation).      Cyanocobalamin (VITAMIN B12) 1000 MCG TBCR Take 1,000 mcg by mouth daily.     diltiazem (CARDIZEM) 30 MG tablet Take 1 tablet (30 mg total) by mouth 4 (four) times daily as needed (for heart rates greater than 100). 30 tablet 1   dofetilide (TIKOSYN) 500 MCG capsule Take 1 capsule (500 mcg total) by mouth 2 (two) times daily. 180 capsule 3    dorzolamide-timolol (COSOPT) 22.3-6.8 MG/ML ophthalmic solution Place 1 drop into both eyes daily.     ELIQUIS 5 MG TABS tablet TAKE 1 TABLET BY MOUTH  TWICE DAILY 180 tablet 1   famotidine (PEPCID) 20 MG tablet Take 20 mg by mouth at bedtime.     fluticasone (FLONASE) 50 MCG/ACT nasal spray Place 1 spray into both nostrils daily as needed for allergies.     furosemide (LASIX) 20 MG tablet Take 1 tablet (20 mg total) by mouth daily as needed for fluid. For >3lb/overnight or >5lb/weekly note dose 30 tablet 4   gabapentin (NEURONTIN) 300 MG capsule Take 300 mg by mouth 3 (three) times daily.      hydrALAZINE (APRESOLINE) 25 MG tablet TAKE 1  TABLET DAILY AS NEEDED FOR BLOOD PRESSURE ABOVE 140 30 tablet 5   latanoprost (XALATAN) 0.005 % ophthalmic solution Place 1 drop into both eyes at bedtime.   6   magnesium oxide (MAG-OX) 400 MG tablet Take 0.5 tablets (200 mg total) by mouth daily. 45 tablet 2   meclizine (ANTIVERT) 25 MG tablet Take 1 tablet by mouth as needed.     Multiple Vitamin (MULTIVITAMIN WITH MINERALS) TABS tablet Take 1 tablet by mouth daily.     NON FORMULARY Apply 1 application topically 2 (two) times daily as needed (for eczema). Traimcinolone/[employer] Moist Cream     oxybutynin (DITROPAN-XL) 10 MG 24 hr tablet Take 10 mg by mouth daily.     pantoprazole (PROTONIX) 40 MG tablet Take 1 tablet by mouth daily.     polyethylene glycol (MIRALAX / GLYCOLAX) 17 g packet Take 17 g by mouth daily as needed for moderate constipation.      potassium chloride SA (KLOR-CON) 20 MEQ tablet Take 20 mEq by mouth See admin instructions. Take as needed with furosemide     PRESCRIPTION MEDICATION Inhale into Mary lungs at bedtime. CPAP     valsartan (DIOVAN) 320 MG tablet TAKE 1 TABLET BY MOUTH EVERY DAY 90 tablet 2   No current facility-administered medications for this visit.    Allergies:   Tizanidine, Amoxicillin, Metoprolol tartrate, and Oxaprozin    Social History:  Mary Farrell  reports that Mary Farrell  has never smoked. Mary Farrell has never used smokeless tobacco. Mary Farrell reports current alcohol use. Mary Farrell reports that Mary Farrell does not use drugs.   Family History:  Mary Farrell's  family history includes Diabetes in her brother; Heart attack in her mother; Heart failure in her maternal grandfather; Hypertension in her brother; Kidney disease in her brother.    ROS:  Please see Mary history of present illness.   Otherwise, review of systems are positive for none.   All other systems are reviewed and negative.    PHYSICAL EXAM: VS:  BP (!) 138/54    Pulse (!) 53    Ht 5' (1.524 m)    Wt 164 lb 12.8 oz (74.8 kg)    BMI 32.19 kg/m  , BMI Body mass index is 32.19 kg/m. GENERAL:  Well appearing HEENT: Pupils equal round and reactive, fundi not visualized, oral mucosa unremarkable NECK:  No jugular venous distention, waveform within normal limits, carotid upstroke brisk and symmetric, no bruits LUNGS:  Clear to auscultation bilaterally HEART:  RRR.  PMI not displaced or sustained,S1 and S2 within normal limits, no S3, no S4, no clicks, no rubs, II/VI  murmur ABD:  Flat, positive bowel sounds normal in frequency in pitch, no bruits, no rebound, no guarding, no midline pulsatile mass, no hepatomegaly, no splenomegaly EXT:  2 plus pulses throughout, no edema, no cyanosis no clubbing SKIN:  No rashes no nodules NEURO:  Cranial nerves II through XII grossly intact, motor grossly intact throughout PSYCH:  Cognitively intact, oriented to person place and time   EKG:  EKG is not ordered today. Mary ekg ordered 05/07/15 demonstraSinus bradycardia rate 57 bpm. 05/13/16: Sinus rhythm. Rate 66 bpm. 09/09/16: Sinus rhythm. Rate 64 bpm.   10/26/16: Sinus rhythm.  Rate 74 bpm.  Exercise Myoveiw 08/26/15: Mary left ventricular ejection fraction is normal (55-65%). Nuclear stress EF: 60%. Blood pressure demonstrated a hypertensive response to exercise. Upsloping ST segment depression ST segment depression of 1 mm was noted during  stress in Mary II, III, V6, V5 and  aVF leads. This is a low risk study.   No reversible ischemia. LVEF 60% with normal wall motion. Fair exercise tolerance. No chest pain. This is a low risk study.   TTE 01/23/19: 1. Left ventricular ejection fraction, by visual estimation, is 55 to  60%. Mary left ventricle has normal function. Left ventricular septal wall  thickness was mildly increased. Moderately increased left ventricular  posterior wall thickness. There is  mildly increased left ventricular hypertrophy.   2. Left ventricular diastolic parameters are indeterminate.   3. Mary left ventricle demonstrates regional wall motion abnormalities.   4. Elevated LVEDP. Hypokinesis of Mary basal septum consistent with  post-operative state.   5. Global right ventricle has normal systolic function.Mary right  ventricular size is normal. No increase in right ventricular wall  thickness.   6. Left atrial size was severely dilated.   7. Right atrial size was normal.   8. Mary mitral valve has been repaired/replaced. No evidence of mitral  valve regurgitation. No evidence of mitral stenosis.   9. Mary tricuspid valve is normal in structure.  10. Mary aortic valve is tricuspid. Aortic valve regurgitation is mild. No  evidence of aortic valve sclerosis or stenosis.  11. Mary pulmonic valve was normal in structure. Pulmonic valve  regurgitation is not visualized.  12. Normal pulmonary artery systolic pressure.  13. Mary inferior vena cava is normal in size with <50% respiratory  variability, suggesting right atrial pressure of 8 mmHg.   09/24/20: Echo  1. Left ventricular ejection fraction, by estimation, is 60 to 65%. Mary  left ventricle has normal function. Mary left ventricle has no regional  wall motion abnormalities. Left ventricular diastolic parameters are  indeterminate.   2. Right ventricular systolic function is normal. Mary right ventricular  size is normal. There is moderately elevated  pulmonary artery systolic  pressure.   3. Left atrial size was moderately dilated.   4. Mary mitral valve has been repaired/replaced. Mild mitral valve  regurgitation. No evidence of mitral stenosis. Mary mean mitral valve  gradient is 2.8 mmHg with average heart rate of 55 bpm. Echo findings are  consistent with normal structure and  function of Mary mitral valve prosthesis.   5. Mary aortic valve is tricuspid. There is mild calcification of Mary  aortic valve. There is mild thickening of Mary aortic valve. Aortic valve  regurgitation is mild. Mild aortic valve sclerosis is present, with no  evidence of aortic valve stenosis.  Aortic valve area, by VTI measures 2.29 cm. Aortic valve mean gradient  measures 6.5 mmHg. Aortic valve Vmax measures 1.69 m/s.   6. Mary inferior vena cava is normal in size with greater than 50%  respiratory variability, suggesting right atrial pressure of 3 mmHg.   Recent Labs: 05/14/2020: BUN 11; Creatinine, Ser 0.91; Magnesium 2.2; Potassium 4.3; Sodium 140; TSH 2.180   07/05/16: Total cholesterol 167, triglycerides 159, HDL 47, LDL 88 TSH 2.2 Sodium 141, potassium 3.8, BUN 14, creatinine 0.77 AST 13, ALT 14  08/03/2017: Total cholesterol 215, triglycerides 121, HDL 67, LDL 124  07/24/2019:  Sodium 137, potassium 4.4, BUN 14, creatinine 0.72 AST 14, ALT 15 Total cholesterol 168, triglycerides 94, HDL 62, LDL 89 TSH 2.39  Lipid Panel No results found for: CHOL, TRIG, HDL, CHOLHDL, VLDL, LDLCALC, LDLDIRECT    Wt Readings from Last 3 Encounters:  02/13/21 164 lb 12.8 oz (74.8 kg)  09/30/20 168 lb 9.6 oz (76.5 kg)  08/14/20 171 lb 3.2 oz (77.7 kg)  ASSESSMENT AND PLAN:  Pericardial effusion Resolved.   SVT (supraventricular tachycardia) (HCC) Symptoms are well-controlled.  Continue carvedilol, Tikosyn and Eliquis.  Mary Farrell hasn't needed any diltiazem.    Essential hypertension Mary Farrell hasn't been able to exercise much due to vertigo but plans to start  again. Mary Farrell wants to start using [employer].  Continue valsartan and carvedilol.   Obesity (BMI 30.0-34.9) Mary Farrell has struggled with stress eating.  Mary Farrell doesn't know what to eat, especially now that Mary Farrell is pre-diabetic.  We will have her see a nutritionist.  Mary Farrell will also work on increasing her exercise.  Stress Mary Farrell has a lot of stress lately.  We will have our Care Guide reach out to her.  S/P minimally-invasive mitral valve replacement with bioprosthetic valve Mitral valve replacement stable on repeat echo.  Mary Farrell has no heart failure symptoms.    Tachycardia-bradycardia syndrome ([place]) Heart rates are labile.  Mary Farrell remains bradycardic but is minimally symptomatic.  We will not reduce her carvedilol anymore because Mary Farrell is also struggled with tach arrhythmias.  Continue current regimen of carvedilol and dofetilide.  Persistent atrial fibrillation/flutter S/P MAZE.  Currently in sinus rhythm.  Continue carvedilol, dofetilide and Eliquis.  Mary Farrell follows up with Dr. Curt Bears.   Current medicines are reviewed at length with Mary Farrell today.  Mary Farrell does not have concerns regarding medicines.  Mary following changes have been made:  Switch losartan tot valsartan  Labs/ tests ordered today include:   Orders Placed This Encounter  Procedures   EKG 12-Lead     Disposition:   FU with Brenlynn Fake C. Oval Linsey, MD, [facility] in 2 months     Signed, Asahd Can C. Oval Linsey, MD, [facility]  02/13/2021 1:58 PM    [place]

## 2021-02-13 NOTE — Assessment & Plan Note (Signed)
Mitral valve replacement stable on repeat echo.  She has no heart failure symptoms.

## 2021-02-13 NOTE — Assessment & Plan Note (Signed)
Symptoms are well-controlled.  Continue carvedilol, Tikosyn and Eliquis.  She hasn't needed any diltiazem.

## 2021-02-13 NOTE — Assessment & Plan Note (Addendum)
She hasn't been able to exercise much due to vertigo but plans to start again. She wants to start using [employer].  Continue valsartan and carvedilol.

## 2021-02-13 NOTE — Assessment & Plan Note (Signed)
She has a lot of stress lately.  We will have our Care Guide reach out to her.

## 2021-02-13 NOTE — Assessment & Plan Note (Signed)
Heart rates are labile.  She remains bradycardic but is minimally symptomatic.  We will not reduce her carvedilol anymore because she is also struggled with tach arrhythmias.  Continue current regimen of carvedilol and dofetilide.

## 2021-02-18 ENCOUNTER — Telehealth: Payer: Self-pay

## 2021-02-18 DIAGNOSIS — Z0000 Encounter for general adult medical examination without abnormal findings: Secondary | ICD-10-CM

## 2021-02-18 NOTE — Telephone Encounter (Signed)
Called patient to determine if she was interested in health coaching for stress management per referral from Dr. Oval Linsey. Patient is interested in health coaching and has been scheduled for her initial session on 1/27 at 8:30am. Patient will be called at this time.     Mackenzi Krogh [place], [facility] Guide, Health Coach [address], [address] [place] [address] Telephone: [phone] Email: [email or web address]

## 2021-02-20 ENCOUNTER — Ambulatory Visit (INDEPENDENT_AMBULATORY_CARE_PROVIDER_SITE_OTHER): Payer: Medicare Other

## 2021-02-20 ENCOUNTER — Other Ambulatory Visit: Payer: Self-pay

## 2021-02-20 DIAGNOSIS — Z0000 Encounter for general adult medical examination without abnormal findings: Secondary | ICD-10-CM

## 2021-02-20 NOTE — Progress Notes (Signed)
Appointment Outcome: Completed, Session #: Initial health coaching session Start time: 8:33am   End time: 9:13am   Total Mins: 40 minutes  AGREEMENTS SECTION   Overall Goal(s): Stress management                                              Agreement/Action Steps:  Daily Devotional Read Bible in the morning Practice deep breathing when feeling overwhelmed Conduct self-check-ins Acknowledge how you feel/Allow self to feel Implement positive self-talk to encourage self Take breaks throughout the day Incorporate wind down time Watch a movie Play game on [place] book on computer Exercise Use Silver Sneakers workout on YouTube In mornings or after 1:30pm Create photo box  Progress Notes:  Patient is her husband's caregiver who has been diagnosed with early-stage dementia. Patient shared that her husband's health has been a source of stress along with concerns about her great-granddaughter's wellbeing.   Patient stated that she feels like she is losing her rock "husband" and cannot be the same rock for him. Patient stated that she is overwhelmed at times now but worry about how things will be as his condition progresses. Patient stated that learning about her great-granddaughter's wellbeing made her sad.   Patient shared a situation where her husband became frustrated because he could not find an item he was looking for, which made her feel overwhelmed because she could not assist him. Patient reported that her blood pressure was high that day and she knew it was from stress. Patient stated that at this time she took her Xanax to calm down.   Patient mentioned that she starts her day with a devotional by reading her Bible. Patient finds herself praying at various times. Patient stated that she allows herself to have a moment to feel and then she regroups and get back to her daily routine. Patient stated that she learned from [employer] about incorporating positive self-talk by  saying things to yourself that you would say to a friend. However, patient described herself as the glass half empty.    Coaching Outcomes: Patient mentioned that she informed Dr. Oval Linsey that she was going to start using the Silver Sneakers workout on YouTube. Patient is interested in incorporating this into her routine either in the morning or in the afternoon after 1:30pm.  Patient will continue to conduct self-check-ins to identify how she is feeling and allow herself to feel those emotions.   Patient will try to implement positive self-talk to combat the negative thoughts and feelings.   Patient will practice deep breathing to help calm herself when she is feeling overwhelmed.   Patient will continue to take breaks throughout the day.  Patient will incorporate a wind down time by playing a game on her Melanee [employer]ry, reading a book on her computer, or watching a movie.   Patient will continue her devotional in the mornings.   Patient is interested in created a photo box as a hobby to keep with capturing positive life moments.   Shared with patient information about [facility] ([facility]) here in [place]. Provided patient with resources from their website that she can connect to a support group, learn various coping strategies in addition to health coaching, and learn more about the condition.   Mailed patient information on how to practice deep breathing, positive self-talk, mindfulness, and self-check-ins.

## 2021-02-27 ENCOUNTER — Telehealth: Payer: Self-pay

## 2021-02-27 ENCOUNTER — Other Ambulatory Visit: Payer: Self-pay

## 2021-02-27 DIAGNOSIS — Z0000 Encounter for general adult medical examination without abnormal findings: Secondary | ICD-10-CM

## 2021-02-27 MED ORDER — DOFETILIDE 500 MCG PO CAPS: 500.0000 ug | ORAL_CAPSULE | Freq: Two times a day (BID) | ORAL | 3 refills | Status: DC

## 2021-02-27 NOTE — Telephone Encounter (Signed)
Called patient to reschedule telephonic health coaching session from 2/10. Patient has been rescheduled for 2/17 at 8:30am. Patient will be called at that time.   Emmalyn Hinson [place], [facility] Guide, Health Coach [address], [address] [place] [address] Telephone: [phone] Email: [email or web address]

## 2021-03-03 ENCOUNTER — Telehealth (HOSPITAL_BASED_OUTPATIENT_CLINIC_OR_DEPARTMENT_OTHER): Payer: Self-pay | Admitting: Cardiovascular Disease

## 2021-03-03 MED ORDER — VALSARTAN 320 MG PO TABS: 320.0000 mg | ORAL_TABLET | Freq: Every day | ORAL | 1 refills | Status: DC

## 2021-03-03 NOTE — Telephone Encounter (Signed)
Received fax from [facility] requesting refills for Valsartan 320 mg. Rx request sent to pharmacy.

## 2021-03-06 ENCOUNTER — Ambulatory Visit: Payer: Medicare Other

## 2021-03-13 ENCOUNTER — Other Ambulatory Visit: Payer: Self-pay

## 2021-03-13 ENCOUNTER — Ambulatory Visit (INDEPENDENT_AMBULATORY_CARE_PROVIDER_SITE_OTHER): Payer: Medicare Other

## 2021-03-13 DIAGNOSIS — Z0000 Encounter for general adult medical examination without abnormal findings: Secondary | ICD-10-CM

## 2021-03-13 NOTE — Progress Notes (Signed)
Appointment Outcome: Completed, Session #: 1 Start time: 8:34am   End time: 9:02am   Total Mins: 28 minutes  AGREEMENTS SECTION   Overall Goal(s): Stress management                                               Agreement/Action Steps:  Daily Devotional Read Bible in the morning Practice deep breathing when feeling overwhelmed Conduct self-check-ins Acknowledge how you feel/Allow self to feel Implement positive self-talk to encourage self Take breaks throughout the day Incorporate wind down time Watch a movie, play game on Kindle, or read book on computer Exercise Use Silver Sneakers workout on YouTube In mornings or after 1:30pm Create photo box   Progress Notes:  Patient stated that she has been able to continue implementing her daily devotional by reading the Bible in the morning, while drinking coffee Patient stated that sometimes she has personal time alone in the mornings until 10am where she spends time watching a religious program, read the paper, or play a game on Kindle.  Patient shared that she enjoys the deep breathing technique where she inhales for a count of 5 and exhale for a count of 5. Patient mentioned that she performs this exercise several times a day to aid in managing her stress. Patient stated that she has not started working on her photo box yet. Instead, the patient has been reading through cards and letters that her son wrote her, which helps distract her for some time and allows her to clear her mind.   Patient stated that she has not conducted self-check-ins or implemented positive self-talk as much in the past two weeks because her husband's condition has been manageable, and she has been able to manage her thoughts and feelings better during this time. Patient did describe two occasions that she had to take a moment to regroup using these techniques.   Patient reported that she was able to perform the Silver Sneakers workout on YouTube twice in the past  two weeks. Patient explained that the tennis shoes that she was wearing were too heavy to exercise in. Patient stated that five minutes into the workout her left hip began to hurt. Patient stated that she did engage in walking on other occasions when visiting local stores such as [employer], and the grocery store. Patient stated that she has not tried walking on her treadmill that she recently ordered because her feet was sore from the other times she walks for extended periods.   Patient mentioned that Dr. Oval Linsey had spoken with her regarding seeing a nutritionist. Patient stated that she has not heard from anyone and is concerned about what to eat for hypertension, high cholesterol, and being a pre-diabetic. Patient stated that she is considering reaching out to her PCP about seeing a nutritionist. Patient mentioned a program available at [facility] but would like something in [place]. Patient mentioned that the [place] would not work for her although it has been recommended previously by another provider.    Indicators of Success and Accountability:  Patient was able to perform deep breathing exercises on a consistent basis.  Readiness: Patient is in the action phase of stress management.  Strengths and Supports: Patient is being supported by family. Patient is resilient.  Challenges and Barriers: Patient do not foresee any challenges/barriers to implementing her action steps over the past  two weeks.    Coaching Outcomes: Patient is interested in exercising two days a week to start off. Patient mentioned that she prefers walking on her new treadmill and will try another pair of tennis shoes to work out in to see if that helps with her feet and hip pain.   Sent a message to Dr. Oval Linsey regarding referral for patient to see a nutritionist.   Patient will implement the following action steps as outlined below over the next two weeks.                                      Agreement/Action Steps:  Daily Devotional Read Bible in the morning Practice deep breathing when feeling overwhelmed Conduct self-check-ins Acknowledge how you feel/Allow self to feel Implement positive self-talk to encourage self Take breaks throughout the day Incorporate wind down time Watch a movie, play game on Kindle, or read book on computer Exercise Use Silver Sneakers workout on YouTube or walk on treadmill In mornings or after 1:30pm Create photo box or engage in another pass time   Attempted: Fulfilled - Patient has been able to maintain her daily devotional, practice deep breathing, take breaks, and incorporate a wind down time.  Partial - Patient exercised twice in the past two weeks using the Silver Sneakers workout. Patient conducted self-check-ins and implemented positive self-talk on two occasions.  Not met - Patient did not start working on creating her photo box.

## 2021-03-18 ENCOUNTER — Other Ambulatory Visit: Payer: Self-pay | Admitting: Cardiology

## 2021-03-18 NOTE — Telephone Encounter (Signed)
Prescription refill request for Eliquis received. Indication: Afib  Last office visit: 02/13/21 Oval Linsey)  Scr: 0.91 (05/14/20)  Age: 76 Weight: 74.8kg  Appropriate dose and refill sen to requested pharmacy.

## 2021-03-19 ENCOUNTER — Telehealth (HOSPITAL_BASED_OUTPATIENT_CLINIC_OR_DEPARTMENT_OTHER): Payer: Self-pay | Admitting: *Deleted

## 2021-03-19 DIAGNOSIS — E66811 Obesity, class 1: Secondary | ICD-10-CM

## 2021-03-19 DIAGNOSIS — E785 Hyperlipidemia, unspecified: Secondary | ICD-10-CM

## 2021-03-19 DIAGNOSIS — I10 Essential (primary) hypertension: Secondary | ICD-10-CM

## 2021-03-19 DIAGNOSIS — E669 Obesity, unspecified: Secondary | ICD-10-CM

## 2021-03-19 NOTE — Telephone Encounter (Signed)
Was not aware, referral placed

## 2021-03-19 NOTE — Telephone Encounter (Signed)
-----   Message from Avelino Leeds sent at 03/13/2021  9:56 AM EST ----- Regarding: Nutritionist referral Hi team,  I held a health coaching session with this patient today and she mentioned that she discussed seeing a nutritionist during her last visit to learn what to eat for her cholesterol, hypertension, and being pre-diabetic. Patient stated that a referral was suppose to have been placed but have not heard anything from anyone. [facility] is [facility] in [facility]. Is this something that can be done on our end or should she contact her PCP?   Best regards, Amy

## 2021-03-25 NOTE — Progress Notes (Signed)
Cardiology Office Note Date:  03/25/2021  Patient ID:  Mary, Farrell Nov 30, 1945, MRN [id number] PCP:  Lawerance Cruel, MD  Cardiologist:  Dr. Oval Linsey Electrophysiologist: Dr. Curt Bears    Chief Complaint:  6 mo  History of Present Illness: Mary Farrell is a 76 y.o. 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 Curt Bears recommended diltiazem 30mg  PRN   She comes in today to be seen for Dr. Curt Bears, last seen by him Sept 2022, had been struggling with vertigo and getting PT for this.  Also a little lightheaded with some lower BPs.  No symptoms of AF. Coreg was reduced with some lower BPs and fatigue.  Subsequently developed tachycardia cardiology APP reviewed Kardia tracings and reported to be AFlutter, her coreg increased some to 9.375  BID  More recently saw Dr. Oval Linsey Jan 2023, since the last adjustment in her coreg no recurrent tachycardia, was having a hard time with her husband's dementia/diagnosis stressful. No changes were made.  TODAY She is doing OK. BP is high, she tells me that her husband is in a middle stage of dementia, is very much in denial about it and is very stressful and fatiguing. She thinks this is the cause of her BP today, typically is better She denies any AFib in quite a while, hasnot used a PRN dilt in nearly a year No CP, SOB No near syncope or syncope.  No  bleeding or signs of bleeding   AFib/AAD Amiodarone not tolerated 2/2  Flecainide 2017 >> failed and had PR prolongation PVI ablation 03/10/2017 CTI/PVI Ablation May 2019 MAZE 2020 Tikosyn Jan 2021  Past Medical History:  Diagnosis Date   Anxiety 06/29/2012   Aortic regurgitation 08/13/2015   Arthritis    "hands, wrists, back, feet, toes" (06/24/2017)   Atrial flutter ([place]) 09/18/2015   Atypical chest pain 05/13/2016   Chest pain    remote cath in 1996 with NORMAL coronaries noted   CHF (congestive heart failure) ([place])    Dyspnea 10/30/2010   Essential hypertension 09/05/2013   Exogenous obesity    history of    GERD (gastroesophageal reflux disease)    Glaucoma, both eyes    Headache, migraine    "stopped in my 38's" (09/18/2015)   History of cardiovascular stress test 2007   showing no ischemia   Hypertension    Mitral stenosis and aortic insufficiency 06/23/2010   Mitral stenosis with insufficiency    Mitral stenosis with regurgitation    Murmur    of mitral stenosis and moderate aortic insufficiency       Nausea 05/01/2018   Obesity (BMI 30.0-34.9) 02/13/2021   OSA on CPAP    Palpitations    occasional   Paroxysmal A-fib ([place]) 06/24/2017   Pericardial effusion 09/18/2015   Persistent atrial fibrillation ([place]) 03/10/2017   Personal history of rheumatic heart disease    S/P minimally-invasive maze operation for atrial fibrillation 12/13/2018  Complete bilateral atrial lesion set using cryothermy and bipolar radiofrequency ablation with clipping of LA appendage via right mini-thoracotomy approach   S/P minimally-invasive mitral valve replacement with bioprosthetic valve 12/13/2018   31 mm [facility] Mitral stented bovine pericardial tissue valve   SBE (subacute bacterial endocarditis)    prophylaxis, patient unaware   Sliding hiatal hernia    Stress 02/13/2021   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   Vertigo 05/01/2018    Past Surgical History:  Procedure  Laterality Date   [place] N/A 03/10/2017   Procedure: ATRIAL FIBRILLATION ABLATION;  Surgeon: [name], MD;  Location: [place] [facility];  Service: Cardiovascular;  Laterality: N/A;   ATRIAL FIBRILLATION ABLATION  06/24/2017   ATRIAL FIBRILLATION ABLATION N/A 06/24/2017   Procedure: ATRIAL FIBRILLATION ABLATION;  Surgeon: [name], MD;  Location: [place] [facility];  Service: Cardiovascular;  Laterality: N/A;   BUNIONECTOMY Right 2000s   CARDIAC CATHETERIZATION  01/13/1995   normal coronary anatomy and mild mitral stenosis and mild pulmonary hypertension   CARDIOVERSION N/A 09/22/2015   Procedure: CARDIOVERSION;  Surgeon: Jerline Pain, MD;  Location: [place];  Service: Cardiovascular;  Laterality: N/A;   CARDIOVERSION N/A 10/25/2016   Procedure: CARDIOVERSION;  Surgeon: Fay Records, MD;  Location: [place];  Service: Cardiovascular;  Laterality: N/A;   CARDIOVERSION N/A 04/15/2017   Procedure: CARDIOVERSION;  Surgeon: Josue Hector, MD;  Location: [facility] ENDOSCOPY;  Service: Cardiovascular;  Laterality: N/A;   CARDIOVERSION N/A 05/16/2017   Procedure: CARDIOVERSION;  Surgeon: Pixie Casino, MD;  Location: [facility] ENDOSCOPY;  Service: Cardiovascular;  Laterality: N/A;   CARDIOVERSION N/A 01/24/2019   Procedure: CARDIOVERSION;  Surgeon: Pixie Casino, MD;  Location: [facility] ENDOSCOPY;  Service: Cardiovascular;  Laterality: N/A;   CATARACT EXTRACTION W/ INTRAOCULAR LENS  IMPLANT, BILATERAL Bilateral 2013   COLONOSCOPY  2013   EYE SURGERY Bilateral    cataracts   LAPAROSCOPIC CHOLECYSTECTOMY  1997   MINIMALLY INVASIVE MAZE PROCEDURE N/A 12/13/2018   Procedure: MINIMALLY INVASIVE MAZE PROCEDURE using a 45 MM AtriClip.;  Surgeon: Rexene Alberts, MD;  Location: [place];  Service: Open Heart Surgery;  Laterality: N/A;   MITRAL VALVE REPLACEMENT Right 12/13/2018   Procedure: MINIMALLY INVASIVE MITRAL  VALVE (MV) REPLACEMENT using a Magna Mitral Ease 31 MM Valve.;  Surgeon: Rexene Alberts, MD;  Location: [place];  Service: Open Heart Surgery;  Laterality: Right;   RIGHT/LEFT HEART CATH AND CORONARY ANGIOGRAPHY N/A 10/18/2018   Procedure: RIGHT/LEFT HEART CATH AND CORONARY ANGIOGRAPHY;  Surgeon: Leonie Man, MD;  Location: [place] [facility];  Service: Cardiovascular;  Laterality: N/A;   TEE WITHOUT CARDIOVERSION N/A 06/23/2017   Procedure: TRANSESOPHAGEAL ECHOCARDIOGRAM (TEE);  Surgeon: Fay Records, MD;  Location: [place];  Service: Cardiovascular;  Laterality: N/A;   TEE WITHOUT CARDIOVERSION N/A 10/17/2018   Procedure: TRANSESOPHAGEAL ECHOCARDIOGRAM (TEE);  Surgeon: Jerline Pain, MD;  Location: [facility] ENDOSCOPY;  Service: Cardiovascular;  Laterality: N/A;   TEE WITHOUT CARDIOVERSION N/A 12/13/2018   Procedure: TRANSESOPHAGEAL ECHOCARDIOGRAM (TEE);  Surgeon: Rexene Alberts, MD;  Location: [place];  Service: Open Heart Surgery;  Laterality: N/A;   TONSILLECTOMY AND ADENOIDECTOMY  1990s   UVULOPALATOPHARYNGOPLASTY, TONSILLECTOMY AND SEPTOPLASTY  1990s    Current Outpatient Medications  Medication Sig Dispense Refill   acetaminophen (TYLENOL) 500 MG tablet Take 1,000 mg by mouth every 6 (six) hours as needed for moderate  pain or headache.      ALPRAZolam (XANAX) 0.25 MG tablet Take 0.125 mg by mouth 2 (two) times daily as needed for anxiety.      apixaban (ELIQUIS) 5 MG TABS tablet TAKE 1 TABLET BY MOUTH  TWICE DAILY 180 tablet 1   Calcium Citrate-Vitamin D (CALCIUM CITRATE + D3) 200-250 MG-UNIT TABS Take 1 tablet by mouth daily with supper.     carvedilol (COREG) 6.25 MG tablet Take 1 tablet (6.25 mg total) by mouth 2 (two) times daily. 180 tablet 3   cetirizine (ZYRTEC) 10 MG tablet Take 10 mg by mouth at bedtime.      Cholecalciferol (VITAMIN D) 2000 UNITS CAPS Take 2,000 Units by mouth daily.     conjugated estrogens (PREMARIN) vaginal cream Place 1 Applicatorful vaginally daily as  needed (irritation).      Cyanocobalamin (VITAMIN B12) 1000 MCG TBCR Take 1,000 mcg by mouth daily.     diltiazem (CARDIZEM) 30 MG tablet Take 1 tablet (30 mg total) by mouth 4 (four) times daily as needed (for heart rates greater than 100). 30 tablet 1   dofetilide (TIKOSYN) 500 MCG capsule Take 1 capsule (500 mcg total) by mouth 2 (two) times daily. 180 capsule 3   dorzolamide-timolol (COSOPT) 22.3-6.8 MG/ML ophthalmic solution Place 1 drop into both eyes daily.     famotidine (PEPCID) 20 MG tablet Take 20 mg by mouth at bedtime.     fluticasone (FLONASE) 50 MCG/ACT nasal spray Place 1 spray into both nostrils daily as needed for allergies.     furosemide (LASIX) 20 MG tablet Take 1 tablet (20 mg total) by mouth daily as needed for fluid. For >3lb/overnight or >5lb/weekly note dose 30 tablet 4   gabapentin (NEURONTIN) 300 MG capsule Take 300 mg by mouth 3 (three) times daily.      hydrALAZINE (APRESOLINE) 25 MG tablet TAKE 1 TABLET DAILY AS NEEDED FOR BLOOD PRESSURE ABOVE 140 30 tablet 5   latanoprost (XALATAN) 0.005 % ophthalmic solution Place 1 drop into both eyes at bedtime.   6   magnesium oxide (MAG-OX) 400 MG tablet Take 0.5 tablets (200 mg total) by mouth daily. 45 tablet 2   meclizine (ANTIVERT) 25 MG tablet Take 1 tablet by mouth as needed.     Multiple Vitamin (MULTIVITAMIN WITH MINERALS) TABS tablet Take 1 tablet by mouth daily.     NON FORMULARY Apply 1 application topically 2 (two) times daily as needed (for eczema). Traimcinolone/[employer] Moist Cream     oxybutynin (DITROPAN-XL) 10 MG 24 hr tablet Take 10 mg by mouth daily.     pantoprazole (PROTONIX) 40 MG tablet Take 1 tablet by mouth daily.     polyethylene glycol (MIRALAX / GLYCOLAX) 17 g packet Take 17 g by mouth daily as needed for moderate constipation.      potassium chloride SA (KLOR-CON) 20 MEQ tablet Take 20 mEq by mouth See admin instructions. Take as needed with furosemide     PRESCRIPTION MEDICATION Inhale into the lungs  at bedtime. CPAP     valsartan (DIOVAN) 320 MG tablet Take 1 tablet (320 mg total) by mouth daily. 90 tablet 1   No current facility-administered medications for this visit.    Allergies:   Tizanidine, Amoxicillin, Metoprolol tartrate, and Oxaprozin   Social History:  The patient  reports that she has never smoked. She has never used smokeless tobacco. She reports current alcohol use. She reports that she does not use drugs.   Family History:  The patient's family history includes Diabetes in her brother; Heart attack in her mother; Heart failure in her maternal grandfather; Hypertension in her brother; Kidney disease in her brother.  ROS:  Please see the history of present illness.    All other systems are reviewed and otherwise negative.   PHYSICAL EXAM:  VS:  There were no vitals taken for this visit. BMI: There is no height or weight on file to calculate BMI. Well nourished, well developed, in no acute distress HEENT: normocephalic, atraumatic Neck: no JVD, carotid bruits or masses Cardiac:  RRR; bradycardic, no significant murmurs, no rubs, or gallops Lungs:   CTA b/l, no wheezing, rhonchi or rales Abd: soft, nontender MS: no deformity or atrophy Ext: no edema Skin: warm and dry, no rash Neuro:  No gross deficits appreciated Psych: euthymic mood, full affect   EKG:  Done today and reviewed by myself shows  SB 52bpm, manually measured QT 480-574ms, QTc 447-453ms  09/24/20: TTE IMPRESSIONS   1. Left ventricular ejection fraction, by estimation, is 60 to 65%. The  left ventricle has normal function. The left ventricle has no regional  wall motion abnormalities. Left ventricular diastolic parameters are  indeterminate.   2. Right ventricular systolic function is normal. The right ventricular  size is normal. There is moderately elevated pulmonary artery systolic  pressure.   3. Left atrial size was moderately dilated.   4. The mitral valve has been repaired/replaced. Mild  mitral valve  regurgitation. No evidence of mitral stenosis. The mean mitral valve  gradient is 2.8 mmHg with average heart rate of 55 bpm. Echo findings are  consistent with normal structure and  function of the mitral valve prosthesis.   5. The aortic valve is tricuspid. There is mild calcification of the  aortic valve. There is mild thickening of the aortic valve. Aortic valve  regurgitation is mild. Mild aortic valve sclerosis is present, with no  evidence of aortic valve stenosis.  Aortic valve area, by VTI measures 2.29 cm. Aortic valve mean gradient  measures 6.5 mmHg. Aortic valve Vmax measures 1.69 m/s.   6. The inferior vena cava is normal in size with greater than 50%  respiratory variability, suggesting right atrial pressure of 3 mmHg.    TTE 01/23/19: 1. Left ventricular ejection fraction, by visual estimation, is 55 to  60%. The left ventricle has normal function. Left ventricular septal wall  thickness was mildly increased. Moderately increased left ventricular  posterior wall thickness. There is  mildly increased left ventricular hypertrophy.   2. Left ventricular diastolic parameters are indeterminate.   3. The left ventricle demonstrates regional wall motion abnormalities.   4. Elevated LVEDP. Hypokinesis of the basal septum consistent with  post-operative state.   5. Global right ventricle has normal systolic function.The right  ventricular size is normal. No increase in right ventricular wall  thickness.   6. Left atrial size was severely dilated.   7. Right atrial size was normal.   8. The mitral valve has been repaired/replaced. No evidence of mitral  valve regurgitation. No evidence of mitral stenosis.   9. The tricuspid valve is normal in structure.  10. The aortic valve is tricuspid. Aortic valve regurgitation is mild. No  evidence of aortic valve sclerosis or stenosis.  11. The pulmonic valve was normal in structure. Pulmonic valve  regurgitation is not  visualized.  12. Normal pulmonary artery systolic pressure.  13. The inferior vena cava is normal in size with <50% respiratory  variability,  suggesting right atrial pressure of 8 mmHg.      10/18/2018: [facility] Hemodynamic findings consistent with mild pulmonary hypertension. LV end diastolic pressure is normal. There is severe mitral valve stenosis -suggested by a echocardiogram, large PCWP V waves along with elevated PCWP but normal LVEDP would also suggest this. Angiographically normal coronary arteries   SUMMARY Angiographically normal coronary arteries Mild pulmonary hypertension with mildly elevated PCWP but normal LVEDP suggestive of mitral stenosis Significant V wave noted on PCWP waveform suggesting mitral stenosis.   RECOMMENDATIONS Return to nursing unit for ongoing care.   Proceed with plans for mitral valve replacement   06/24/2017 EPS/ablation CONCLUSIONS: 1. Sinus rhythm upon presentation.   2. Successful electrical isolation and anatomical encircling of all four pulmonary veins with radiofrequency current.    3. Cavo-tricuspid isthmus ablation was performed with complete bidirectional isthmus block achieved.  4. No inducible arrhythmias following ablation both on and off of dobutamine 5. No early apparent complications.   03/10/2017: EPS/ablation CONCLUSIONS: 1. Atrial fibrillation upon presentation.   2. Successful electrical isolation and anatomical encircling of all four pulmonary veins with radiofrequency current.  A WACA approach was used 3. Additional left atrial ablation was performed with a left atrial roof line  4. Atrial fibrillation successfully cardioverted to sinus rhythm. 5. No early apparent complications.     Recent Labs: 05/14/2020: BUN 11; Creatinine, Ser 0.91; Magnesium 2.2; Potassium 4.3; Sodium 140; TSH 2.180  No results found for requested labs within last 8760 hours.   CrCl cannot be calculated (Patient's most recent lab result is older  than the maximum 21 days allowed.).   Wt Readings from Last 3 Encounters:  02/13/21 164 lb 12.8 oz (74.8 kg)  09/30/20 168 lb 9.6 oz (76.5 kg)  08/14/20 171 lb 3.2 oz (77.7 kg)     Other studies reviewed: Additional studies/records reviewed today include: summarized above  ASSESSMENT AND PLAN:  1. Persistent AFib     CHA2DS2Vasc is 3, on Eliquis, appropriately dose     On Tikosyn, QTc is OK        Labs today  2. SVT     EKG looked like AFlutter though ER note mentions no noted flutter waves with adenosine, tracing is unavailable      Baseline SB with her coreg     No symptoms of bradycardia   3. MVR (bioprosthetic) 4. Chronic pericardial effusion (trivial by last echo 2022)     Stable by last echo 2022     C/w Dr. Oval Linsey  5. HTN     She is monitoring at home and reports typically much better then today     She will keep an eye on it and let Dr. Oval Linsey know if routinely elevated    Disposition: back with EP in 3mo, sooner if needed   Current medicines are reviewed at length with the patient today.  The patient did not have any concerns regarding medicines.  Venetia Night, PA-C 03/25/2021 11:48 AM     [facility] [place] [address] [phone] (office)  [phone] (fax)

## 2021-03-27 ENCOUNTER — Other Ambulatory Visit: Payer: Self-pay

## 2021-03-27 ENCOUNTER — Ambulatory Visit (INDEPENDENT_AMBULATORY_CARE_PROVIDER_SITE_OTHER): Payer: Medicare Other

## 2021-03-27 DIAGNOSIS — Z0000 Encounter for general adult medical examination without abnormal findings: Secondary | ICD-10-CM

## 2021-03-27 NOTE — Progress Notes (Signed)
Appointment Outcome: Completed, Session #: 2 ?Start time: 9:19am   End time: 9:47am   Total Mins: 28 minutes ? ?AGREEMENTS SECTION ? ? ?Overall Goal(s): ?Stress management   ? ?Agreement/Action Steps:  ?Daily Devotional ?Read Bible in the morning ?Practice deep breathing when feeling overwhelmed ?Conduct self-check-ins ?Implement positive self-talk to encourage self ?Take breaks throughout the day ?Incorporate wind down time ?Watch a movie, play game on [place], or read book on computer ?Exercise ?Use Silver Sneakers workout on YouTube or walk on treadmill ?In mornings or after 1:30pm ?Create photo box or engage in another pass time ? ?Progress Notes:  ?Patient shared that her stress level this morning is a 2. Patient stated that there haven't been too many stressful moments in the past two weeks, and when there were stressful moments, she was able to deal with them. Patient reported that she has been able to have her devotional each morning with her coffee. Patient stated this is also a quiet time where she can get other things done.  ? ?Patient mentioned that there were times that she had to practice deep breathing and positive self-talk. Patient periodically conducts self-check-ins to help identify how she is feeling in various moments, which has been helpful in moments when she didn't know what to do. Patient stated that she also compartmentalizes (put things in a box) and deal with them when and if necessary. Patient expressed that this has been helpful with not creating stress for herself. Patient perspective towards her stressors is changing. Patient expressed that things could be worse.  ? ?Patient stated that she takes a break from everything during the day for one hour while watching tv. Patient continues to implement her wind down time. Patient mentioned that she has finished her book on Kindle. Patient stated that she is eligible for another free book on Kindle but plans to read a book that a family  member purchased for her next.  ? ?Patient mentioned that she has been able to talk to various friends and laughed. Patient shared that talking to them was a stress reliever as well. Patient stated that they had a family gathering for her husband's birthday and she made a cake for him. Patient expressed that they had a great time engaging with one another. ? ?Patient reported that she has walked on the treadmill twice in the past two weeks. Patient shared that she was able to stay on the treadmill for 7 minutes one time before her hips started hurting. Patient stated that they had such trouble with the machine, and it was frustrating that she hasn't bothered it much. Patient stated that in place of walking on the treadmill, she went with her husband 3-4 days to various stores and walked. Patient shared that she walked so much that her feet started hurting.  ? 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 Patient reported that she has recently rejoined [employer] and will be focused on her eating habits. Patient stated that being in the program will give her something else to focus on.  ? ?Indicators of Success and Accountability:  Patient has been able to approach stressful times in a positive way or minimize stress by using her steps such as deep breathing.  ?Readiness: Patient is in the action phase of stress management.  ?Strengths and Supports: Patient is being supported by family. Patient is consistent with implementing her action steps.  ?Challenges and Barriers: Patient does not foresee any challenges/barriers to implementing  her action steps over the next two weeks.  ? 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 Instead, the patient has joined [employer] and will be focused on her eating habits.  ? ?Patient will implement the following action steps over the next two weeks. ? ?Agreement/Action Steps:  ?Daily Devotional ?Read Bible in the  morning ?Practice deep breathing when feeling overwhelmed ?Conduct self-check-ins ?Implement positive self-talk to encourage self ?Take breaks throughout the day ?Incorporate wind down time ?Watch a movie, play game on [place], or read book on computer ?Exercise ?Use Silver Sneakers workout on YouTube or walk on treadmill ?In mornings or after 1:30pm ? ?Attempted: ?Fulfilled - Patient has been able to continue implementing her daily devotional, practice deep breathing, conduct self-check-ins, implement positive self-talk, take breaks throughout the day, incorporate a wind down time.  ?Partial - Patient has been able to exercise twice in the past two weeks using the treadmill.  ?Not met - Patient did not attempt to create a photo box and have decided that this is not a step that she wants to implement.  ?

## 2021-03-29 ENCOUNTER — Encounter: Payer: Self-pay | Admitting: Cardiology

## 2021-03-30 ENCOUNTER — Ambulatory Visit (INDEPENDENT_AMBULATORY_CARE_PROVIDER_SITE_OTHER): Payer: Medicare Other | Admitting: Physician Assistant

## 2021-03-30 ENCOUNTER — Encounter: Payer: Self-pay | Admitting: Physician Assistant

## 2021-03-30 ENCOUNTER — Other Ambulatory Visit: Payer: Self-pay

## 2021-03-30 VITALS — BP 152/62 | HR 56 | Ht 60.0 in | Wt 165.6 lb

## 2021-03-30 DIAGNOSIS — Z5181 Encounter for therapeutic drug level monitoring: Secondary | ICD-10-CM

## 2021-03-30 DIAGNOSIS — I4819 Other persistent atrial fibrillation: Secondary | ICD-10-CM | POA: Diagnosis not present

## 2021-03-30 DIAGNOSIS — Z952 Presence of prosthetic heart valve: Secondary | ICD-10-CM

## 2021-03-30 DIAGNOSIS — Z79899 Other long term (current) drug therapy: Secondary | ICD-10-CM

## 2021-03-30 DIAGNOSIS — I10 Essential (primary) hypertension: Secondary | ICD-10-CM | POA: Diagnosis not present

## 2021-03-30 DIAGNOSIS — I471 Supraventricular tachycardia: Secondary | ICD-10-CM

## 2021-03-30 NOTE — Patient Instructions (Signed)
Medication Instructions:  ?Your physician has requested that you have a carotid duplex. This test is an ultrasound of the carotid arteries in your neck. It looks at blood flow through these arteries that supply the brain with blood. Allow one hour for this exam. There are no restrictions or special instructions. ? ?*If you need a refill on your cardiac medications before your next appointment, please call your pharmacy* ? ? ?Lab Work: ?TODAY: CBC, BMET, MG ?If you have labs (blood work) drawn today and your tests are completely normal, you will receive your results only by: ?MyChart Message (if you have MyChart) OR ?A paper copy in the mail ?If you have any lab test that is abnormal or we need to change your treatment, we will call you to review the results. ? ? ?Testing/Procedures: ?NONE ? ?Follow-Up: ?At [facility], you and your health needs are our priority.  As part of our continuing mission to provide you with exceptional heart care, we have created designated Provider Care Teams.  These Care Teams include your primary Cardiologist (physician) and Advanced Practice Providers (APPs -  Physician Assistants and Nurse Practitioners) who all work together to provide you with the care you need, when you need it. ? ?We recommend signing up for the patient portal called "MyChart".  Sign up information is provided on this After Visit Summary.  MyChart is used to connect with patients for Virtual Visits (Telemedicine).  Patients are able to view lab/test results, encounter notes, upcoming appointments, etc.  Non-urgent messages can be sent to your provider as well.   ?To learn more about what you can do with MyChart, go to [email or web address].   ? ?Your next appointment:   ?6 month(s) ? ?The format for your next appointment:   ?In Person ? ?Provider:   ?Tommye Standard, PA-C{ ? ?

## 2021-03-31 LAB — BASIC METABOLIC PANEL
BUN/Creatinine Ratio: 15 (ref 12–28)
BUN: 13 mg/dL (ref 8–27)
CO2: 27 mmol/L (ref 20–29)
Calcium: 9.3 mg/dL (ref 8.7–10.3)
Chloride: 101 mmol/L (ref 96–106)
Creatinine, Ser: 0.89 mg/dL (ref 0.57–1.00)
Glucose: 91 mg/dL (ref 70–99)
Potassium: 4.4 mmol/L (ref 3.5–5.2)
Sodium: 140 mmol/L (ref 134–144)
eGFR: 67 mL/min/{1.73_m2} (ref >59)

## 2021-03-31 LAB — CBC
Hematocrit: 31.7 % — ABNORMAL LOW (ref 34.0–46.6)
Hemoglobin: 9.8 g/dL — ABNORMAL LOW (ref 11.1–15.9)
MCH: 24 pg — ABNORMAL LOW (ref 26.6–33.0)
MCHC: 30.9 g/dL — ABNORMAL LOW (ref 31.5–35.7)
MCV: 78 fL — ABNORMAL LOW (ref 79–97)
Platelets: 191 10*3/uL (ref 150–450)
RBC: 4.08 x10E6/uL (ref 3.77–5.28)
RDW: 16.4 % — ABNORMAL HIGH (ref 11.7–15.4)
WBC: 6.6 10*3/uL (ref 3.4–10.8)

## 2021-03-31 LAB — MAGNESIUM: Magnesium: 2.2 mg/dL (ref 1.6–2.3)

## 2021-04-02 ENCOUNTER — Other Ambulatory Visit: Payer: Self-pay | Admitting: *Deleted

## 2021-04-02 DIAGNOSIS — Z79899 Other long term (current) drug therapy: Secondary | ICD-10-CM

## 2021-04-03 ENCOUNTER — Other Ambulatory Visit: Payer: Self-pay

## 2021-04-03 ENCOUNTER — Other Ambulatory Visit: Payer: Medicare Other | Admitting: *Deleted

## 2021-04-03 DIAGNOSIS — Z5181 Encounter for therapeutic drug level monitoring: Secondary | ICD-10-CM

## 2021-04-03 LAB — HEMOGLOBIN AND HEMATOCRIT, BLOOD
Hematocrit: 32.6 % — ABNORMAL LOW (ref 34.0–46.6)
Hemoglobin: 10 g/dL — ABNORMAL LOW (ref 11.1–15.9)

## 2021-04-08 ENCOUNTER — Telehealth: Payer: Self-pay | Admitting: *Deleted

## 2021-04-08 NOTE — Telephone Encounter (Signed)
Spoke with patient and patient aware of results and verbalized understanding. Patient was confused with Carotid information put in medication area which was an typo error. No carotid was ordered wrong information wrong spot .It should have just said take your current medications. ?

## 2021-04-10 ENCOUNTER — Telehealth: Payer: Self-pay

## 2021-04-10 ENCOUNTER — Ambulatory Visit: Payer: Medicare Other

## 2021-04-10 DIAGNOSIS — Z0000 Encounter for general adult medical examination without abnormal findings: Secondary | ICD-10-CM

## 2021-04-10 NOTE — Telephone Encounter (Signed)
A user error has taken place.

## 2021-04-10 NOTE — Telephone Encounter (Signed)
Called patient for health coaching session. Patient stated that she has joined a program with [employer] on Aging, which host a monthly Zoom meeting regarding Dementia. Patient thinks this will be better for her to take that approach. Patient ended health coaching on 3/17. ? ? ?Avelino Leeds, Lyle ?[facility] ?Care Guide, Health Coach ?[address] ?[place] [address] ?Telephone: [phone] ?Email: [email or web address] ? ?

## 2021-05-07 ENCOUNTER — Other Ambulatory Visit: Payer: Self-pay | Admitting: Cardiology

## 2021-05-07 NOTE — Telephone Encounter (Signed)
This is Dr. Boonton's pt.  °

## 2021-06-05 ENCOUNTER — Encounter: Payer: Self-pay | Admitting: Cardiology

## 2021-08-15 ENCOUNTER — Other Ambulatory Visit (HOSPITAL_BASED_OUTPATIENT_CLINIC_OR_DEPARTMENT_OTHER): Payer: Self-pay | Admitting: Cardiovascular Disease

## 2021-08-17 NOTE — Telephone Encounter (Signed)
Rx request sent to pharmacy.  

## 2021-08-26 ENCOUNTER — Encounter: Payer: Self-pay | Admitting: Cardiology

## 2021-08-26 ENCOUNTER — Ambulatory Visit (INDEPENDENT_AMBULATORY_CARE_PROVIDER_SITE_OTHER): Payer: Medicare Other | Admitting: Nurse Practitioner

## 2021-08-26 ENCOUNTER — Telehealth: Payer: Self-pay | Admitting: Cardiology

## 2021-08-26 ENCOUNTER — Encounter: Payer: Self-pay | Admitting: Nurse Practitioner

## 2021-08-26 VITALS — BP 130/62 | HR 133 | Ht 60.0 in | Wt 162.0 lb

## 2021-08-26 DIAGNOSIS — I10 Essential (primary) hypertension: Secondary | ICD-10-CM

## 2021-08-26 DIAGNOSIS — I4819 Other persistent atrial fibrillation: Secondary | ICD-10-CM

## 2021-08-26 DIAGNOSIS — I4892 Unspecified atrial flutter: Secondary | ICD-10-CM | POA: Diagnosis not present

## 2021-08-26 DIAGNOSIS — Z7901 Long term (current) use of anticoagulants: Secondary | ICD-10-CM

## 2021-08-26 MED ORDER — DILTIAZEM HCL ER COATED BEADS 120 MG PO CP24: 120.0000 mg | ORAL_CAPSULE | Freq: Every day | ORAL | 1 refills | Status: DC

## 2021-08-26 NOTE — Telephone Encounter (Signed)
STAT if HR is under 50 or over 120 (normal HR is 60-100 beats per minute)  What is your heart rate? 63  Do you have a log of your heart rate readings (document readings)?  52 127 - 5 am  Do you have any other symptoms? Pt states that she just feels weird. Says that she thinks she's in AFIB

## 2021-08-26 NOTE — Telephone Encounter (Signed)
Spoke with the patient who states that she woke up around 5 am this morning with a rapid heart rate. She has a Investment banker, operational mobile and took a reading showing HR at 127. She states that she took diltiazem 30 mg and then an hour later took another 30 mg plus a xanax. She took another [place] mobile reading showing HR at 52. Patient reports originally blood pressure was up at 153/? But has since come down to 90/?Marland Kitchen She also reports that she has taken her morning medications which include hydralazine, carvedilol and tikosyn.  Patient states that she is feeling better. States that she does feel just a little bit off but denies any dizziness or lightheadedness. Reports that she has not had anything to eat or drink this morning which I have encouraged her to do. She will continue to monitor symptoms. She has sent over her EKG readings from this morning and will send another one later.

## 2021-08-26 NOTE — Progress Notes (Signed)
Cardiology Office Note:    Date:  08/26/2021   ID:  JENNIGER Farrell, DOB [birth year], MRN [id number]  PCP:  Lawerance Cruel, MD   [facility] Providers Cardiologist:  None Electrophysiologist:  Will Meredith Leeds, MD     Referring MD: Lawerance Cruel, MD   Chief Complaint: fast heart rate  History of Present Illness:    Mary Farrell is a very pleasant 76 y.o. 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  Vagal maneuvers were unsuccessful and adenosine was given.  Notes report no flutter waves noted with conversion to SR though despite SR she continued to feel unwell and was kept for observation. Tracings are not available. Hs troponin mildly elevated at 8 ? 18, discussed with cardiology and felt reasonable for discharge, was given mag replacement.  She was last seen in our office on 03/30/2021 by Tommye Standard, PA. Reported that at previous office visit 09/2020 with Dr. Curt Bears, had been struggling with vertigo and was undergoing PT. Felt lightheaded with some lower blood pressures. Coreg was reduced in the setting of low BP and fatigue.  Subsequently developed tachycardia. Kardia tracings were reviewed and reported to be a flutter. Coreg was increased to 9.375 BID. Reported stress caring for her husband with dementia.  Her blood pressure was elevated at office visit 03/30/2021.  She contacted our office today with concerns for tachycardia, was awakened at 05 100 with heart beating really fast.  Took diltiazem 30 mg at 515 and another at 6 AM.  By 7 AM heart rate was down, cardia monitor said A-fib.  She reported her heart rate later increased to 129 bpm then down to 70s.  She was scheduled for an office visit today.  She reports she woke up this morning not feeling right, heart was  racing. Has felt that HR has been well-controlled. No problems with HR being this fast since June 2022.  Checked her cardia monitor when she woke up and it revealed HR 127 bpm.  She took her as needed diltiazem. Had intermittent atrial fib at slower rates today after she took diltiazem 30 mg x 2. Also took hydralazine because her BP was elevated. Has been on Tikosyn since January 2021. Symptom of a fib is SOB which she noted earlier today. Currently, she denies chest pain, lower extremity edema, fatigue, palpitations, melena, hematuria, hemoptysis, diaphoresis, weakness, presyncope, syncope, orthopnea, and PND.   Past Medical History:  Diagnosis Date   Anxiety 06/29/2012   Aortic regurgitation 08/13/2015   Arthritis    "hands, wrists, back, feet, toes" (06/24/2017)   Atrial flutter ([place]) 09/18/2015   Atypical chest pain 05/13/2016   Chest pain    remote cath in 1996 with NORMAL coronaries noted   CHF (congestive heart failure) ([place])    Dyspnea 10/30/2010   Essential hypertension 09/05/2013   Exogenous obesity    history of    GERD (gastroesophageal reflux disease)    Glaucoma, both eyes    Headache, migraine    "stopped in my 13's" (09/18/2015)   History of cardiovascular stress test 2007   showing no ischemia   Hypertension    Mitral stenosis and aortic insufficiency 06/23/2010   Mitral stenosis with insufficiency    Mitral stenosis with regurgitation    Murmur    of  mitral stenosis and moderate aortic insufficiency       Nausea 05/01/2018   Obesity (BMI 30.0-34.9) 02/13/2021   OSA on CPAP    Palpitations    occasional   Paroxysmal A-fib ([place]) 06/24/2017   Pericardial effusion 09/18/2015   Persistent atrial fibrillation ([place]) 03/10/2017   Personal history of rheumatic heart disease    S/P minimally-invasive maze operation for atrial fibrillation 12/13/2018   Complete bilateral atrial lesion set using cryothermy and bipolar radiofrequency ablation with clipping of LA appendage via right  mini-thoracotomy approach   S/P minimally-invasive mitral valve replacement with bioprosthetic valve 12/13/2018   31 mm [facility] Mitral stented bovine pericardial tissue valve   SBE (subacute bacterial endocarditis)    prophylaxis, patient unaware   Sliding hiatal hernia    Stress 02/13/2021   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   Vertigo 05/01/2018    Past Surgical History:  Procedure Laterality Date   [place] N/A 03/10/2017   Procedure: ATRIAL FIBRILLATION ABLATION;  Surgeon: [name], MD;  Location: [place] [facility];  Service: Cardiovascular;  Laterality: N/A;   ATRIAL FIBRILLATION ABLATION  06/24/2017   ATRIAL FIBRILLATION ABLATION N/A 06/24/2017   Procedure: ATRIAL FIBRILLATION ABLATION;  Surgeon: [name], MD;  Location: [place] [facility];  Service: Cardiovascular;  Laterality: N/A;   BUNIONECTOMY Right 2000s   CARDIAC CATHETERIZATION  01/13/1995   normal coronary anatomy and mild mitral stenosis and mild pulmonary hypertension   CARDIOVERSION N/A 09/22/2015   Procedure: CARDIOVERSION;  Surgeon: Jerline Pain, MD;  Location: [place];  Service: Cardiovascular;  Laterality: N/A;   CARDIOVERSION N/A 10/25/2016   Procedure: CARDIOVERSION;  Surgeon: Fay Records, MD;  Location: [place];  Service: Cardiovascular;  Laterality: N/A;   CARDIOVERSION N/A 04/15/2017   Procedure: CARDIOVERSION;  Surgeon: Josue Hector, MD;  Location: [facility] ENDOSCOPY;  Service: Cardiovascular;  Laterality: N/A;   CARDIOVERSION N/A 05/16/2017   Procedure: CARDIOVERSION;  Surgeon: Pixie Casino, MD;  Location: [facility] ENDOSCOPY;  Service: Cardiovascular;  Laterality: N/A;   CARDIOVERSION N/A 01/24/2019   Procedure: CARDIOVERSION;  Surgeon: Pixie Casino, MD;  Location: [facility] ENDOSCOPY;  Service: Cardiovascular;  Laterality: N/A;   CATARACT EXTRACTION W/ INTRAOCULAR LENS  IMPLANT, BILATERAL  Bilateral 2013   COLONOSCOPY  2013   EYE SURGERY Bilateral    cataracts   LAPAROSCOPIC CHOLECYSTECTOMY  1997   MINIMALLY INVASIVE MAZE PROCEDURE N/A 12/13/2018   Procedure: MINIMALLY INVASIVE MAZE PROCEDURE using a 45 MM AtriClip.;  Surgeon: Rexene Alberts, MD;  Location: [place];  Service: Open Heart Surgery;  Laterality: N/A;   MITRAL VALVE REPLACEMENT Right 12/13/2018   Procedure: MINIMALLY INVASIVE MITRAL VALVE (MV) REPLACEMENT using a Magna Mitral Ease 31 MM Valve.;  Surgeon: Rexene Alberts, MD;  Location: [place];  Service: Open Heart Surgery;  Laterality: Right;   RIGHT/LEFT HEART CATH AND CORONARY ANGIOGRAPHY N/A 10/18/2018   Procedure: RIGHT/LEFT HEART CATH AND CORONARY ANGIOGRAPHY;  Surgeon: Leonie Man, MD;  Location: [place] [facility];  Service: Cardiovascular;  Laterality: N/A;   TEE WITHOUT CARDIOVERSION N/A 06/23/2017   Procedure: TRANSESOPHAGEAL ECHOCARDIOGRAM (TEE);  Surgeon: Fay Records, MD;  Location: [place];  Service: Cardiovascular;  Laterality: N/A;   TEE WITHOUT CARDIOVERSION N/A 10/17/2018   Procedure: TRANSESOPHAGEAL ECHOCARDIOGRAM (TEE);  Surgeon: Jerline Pain, MD;  Location: [facility] ENDOSCOPY;  Service: Cardiovascular;  Laterality: N/A;   TEE WITHOUT CARDIOVERSION  N/A 12/13/2018   Procedure: TRANSESOPHAGEAL ECHOCARDIOGRAM (TEE);  Surgeon: Rexene Alberts, MD;  Location: [place];  Service: Open Heart Surgery;  Laterality: N/A;   TONSILLECTOMY AND ADENOIDECTOMY  1990s   UVULOPALATOPHARYNGOPLASTY, TONSILLECTOMY AND SEPTOPLASTY  1990s    Current Medications: Current Meds  Medication Sig   acetaminophen (TYLENOL) 500 MG tablet Take 1,000 mg by mouth every 6 (six) hours as needed for moderate pain or headache.    ALPRAZolam (XANAX) 0.25 MG tablet Take 0.125 mg by mouth 2 (two) times daily as needed for anxiety.    apixaban (ELIQUIS) 5 MG TABS tablet TAKE 1 TABLET BY MOUTH  TWICE DAILY   Calcium Citrate-Vitamin D (CALCIUM CITRATE + D3) 200-250 MG-UNIT TABS Take 1  tablet by mouth daily with supper.   carvedilol (COREG) 6.25 MG tablet TAKE 1 TABLET BY MOUTH TWICE  DAILY   cetirizine (ZYRTEC) 10 MG tablet Take 10 mg by mouth at bedtime.    Cholecalciferol (VITAMIN D) 2000 UNITS CAPS Take 2,000 Units by mouth daily.   conjugated estrogens (PREMARIN) vaginal cream Place 1 Applicatorful vaginally daily as needed (irritation).    Cyanocobalamin (VITAMIN B12) 1000 MCG TBCR Take 1,000 mcg by mouth daily.   diltiazem (CARDIZEM CD) 120 MG 24 hr capsule Take 1 capsule (120 mg total) by mouth daily with lunch.   diltiazem (CARDIZEM) 30 MG tablet Take 1 tablet (30 mg total) by mouth 4 (four) times daily as needed (for heart rates greater than 100).   dofetilide (TIKOSYN) 500 MCG capsule Take 1 capsule (500 mcg total) by mouth 2 (two) times daily.   dorzolamide-timolol (COSOPT) 22.3-6.8 MG/ML ophthalmic solution Place 1 drop into both eyes daily.   famotidine (PEPCID) 20 MG tablet Take 20 mg by mouth at bedtime.   fluticasone (FLONASE) 50 MCG/ACT nasal spray Place 1 spray into both nostrils daily as needed for allergies.   furosemide (LASIX) 20 MG tablet Take 1 tablet (20 mg total) by mouth daily as needed for fluid. For >3lb/overnight or >5lb/weekly note dose   gabapentin (NEURONTIN) 300 MG capsule Take 300 mg by mouth 3 (three) times daily.    hydrALAZINE (APRESOLINE) 25 MG tablet TAKE 1 TABLET DAILY AS NEEDED FOR BLOOD PRESSURE ABOVE 140   latanoprost (XALATAN) 0.005 % ophthalmic solution Place 1 drop into both eyes at bedtime.    magnesium oxide (MAG-OX) 400 MG tablet Take 0.5 tablets (200 mg total) by mouth daily.   meclizine (ANTIVERT) 25 MG tablet Take 1 tablet by mouth as needed.   Multiple Vitamin (MULTIVITAMIN WITH MINERALS) TABS tablet Take 1 tablet by mouth daily.   NON FORMULARY Apply 1 application topically 2 (two) times daily as needed (for eczema). Traimcinolone/[employer] Moist Cream   oxybutynin (DITROPAN-XL) 10 MG 24 hr tablet Take 10 mg by mouth daily.    pantoprazole (PROTONIX) 40 MG tablet Take 1 tablet by mouth daily.   polyethylene glycol (MIRALAX / GLYCOLAX) 17 g packet Take 17 g by mouth daily as needed for moderate constipation.    potassium chloride SA (KLOR-CON) 20 MEQ tablet Take 20 mEq by mouth See admin instructions. Take as needed with furosemide   PRESCRIPTION MEDICATION Inhale into the lungs at bedtime. CPAP   valsartan (DIOVAN) 320 MG tablet TAKE 1 TABLET BY MOUTH DAILY     Allergies:   Tizanidine, Amoxicillin, Metoprolol tartrate, and Oxaprozin   Social History   Socioeconomic History   Marital status: Married    Spouse name: Not on file   Number of  children: Not on file   Years of education: Not on file   Highest education level: Not on file  Occupational History   Occupation: Retired  Tobacco Use   Smoking status: Never   Smokeless tobacco: Never  Vaping Use   Vaping Use: Never used  Substance and Sexual Activity   Alcohol use: Yes    Comment: rarely   Drug use: No   Sexual activity: Not Currently  Other Topics Concern   Not on file  Social History Narrative   Lives with husband in [place].   Social Determinants of Health   Financial Resource Strain: Not on file  Food Insecurity: Not on file  Transportation Needs: Not on file  Physical Activity: Not on file  Stress: Not on file  Social Connections: Not on file     Family History: The patient's family history includes Diabetes in her brother; Heart attack in her mother; Heart failure in her maternal grandfather; Hypertension in her brother; Kidney disease in her brother.  ROS:   Please see the history of present illness.    + shortness of breath + fast heart beat All other systems reviewed and are negative.  Labs/Other Studies Reviewed:    The following studies were reviewed today:  AFib/AAD Amiodarone not tolerated 2/2  Flecainide 2017 >> failed and had PR prolongation PVI ablation 03/10/2017 CTI/PVI Ablation May 2019 MAZE  2020 Tikosyn Jan 2021  Echo 09/24/20  1. Left ventricular ejection fraction, by estimation, is 60 to 65%. The  left ventricle has normal function. The left ventricle has no regional  wall motion abnormalities. Left ventricular diastolic parameters are  indeterminate.   2. Right ventricular systolic function is normal. The right ventricular  size is normal. There is moderately elevated pulmonary artery systolic  pressure.   3. Left atrial size was moderately dilated.   4. The mitral valve has been repaired/replaced. Mild mitral valve  regurgitation. No evidence of mitral stenosis. The mean mitral valve  gradient is 2.8 mmHg with average heart rate of 55 bpm. Echo findings are  consistent with normal structure and  function of the mitral valve prosthesis.   5. The aortic valve is tricuspid. There is mild calcification of the  aortic valve. There is mild thickening of the aortic valve. Aortic valve  regurgitation is mild. Mild aortic valve sclerosis is present, with no  evidence of aortic valve stenosis.  Aortic valve area, by VTI measures 2.29 cm. Aortic valve mean gradient  measures 6.5 mmHg. Aortic valve Vmax measures 1.69 m/s.   6. The inferior vena cava is normal in size with greater than 50%  respiratory variability, suggesting right atrial pressure of 3 mmHg.   [facility] 10/18/18  Hemodynamic findings consistent with mild pulmonary hypertension. LV end diastolic pressure is normal. There is severe mitral valve stenosis -suggested by a echocardiogram, large PCWP V waves along with elevated PCWP but normal LVEDP would also suggest this. Angiographically normal coronary arteries   SUMMARY Angiographically normal coronary arteries Mild pulmonary hypertension with mildly elevated PCWP but normal LVEDP suggestive of mitral stenosis Significant V wave noted on PCWP waveform suggesting mitral stenosis.  Recent Labs: 03/30/2021: BUN 13; Creatinine, Ser 0.89; Magnesium 2.2; Platelets 191;  Potassium 4.4; Sodium 140 04/03/2021: Hemoglobin 10.0  Recent Lipid Panel No results found for: "CHOL", "TRIG", "HDL", "CHOLHDL", "VLDL", "LDLCALC", "LDLDIRECT"   Risk Assessment/Calculations:    CHA2DS2-VASc Score = 4  This indicates a 4.8% annual risk of stroke. The patient's score is based upon:  CHF History: 0 HTN History: 1 Diabetes History: 0 Stroke History: 0 Vascular Disease History: 0 Age Score: 2 Gender Score: 1    Physical Exam:    VS:  BP 130/62   Pulse (!) 133   Ht 5' (1.524 m)   Wt 162 lb (73.5 kg)   SpO2 97%   BMI 31.64 kg/m     Wt Readings from Last 3 Encounters:  08/26/21 162 lb (73.5 kg)  03/30/21 165 lb 9.6 oz (75.1 kg)  02/13/21 164 lb 12.8 oz (74.8 kg)     GEN:  Well nourished, well developed in no acute distress HEENT: Normal NECK: No JVD; No carotid bruits CARDIAC: RRR, no murmurs, rubs, gallops RESPIRATORY:  Clear to auscultation without rales, wheezing or rhonchi  ABDOMEN: Soft, non-tender, non-distended MUSCULOSKELETAL:  No edema; No deformity. 2+ pedal pulses, equal bilaterally SKIN: Warm and dry NEUROLOGIC:  Alert and oriented x 3 PSYCHIATRIC:  Normal affect   EKG:  EKG is ordered today.  The ekg ordered today demonstrates atrial flutter at 133 bpm  Diagnoses:    1. Atrial flutter, unspecified type ([place])   2. Chronic anticoagulation   3. Essential hypertension   4. Persistent atrial fibrillation (HCC)    Assessment and Plan:     Atrial flutter: EKG today reveals atrial flutter at 133 bpm.  She was awakened this morning with fast HR and home monitor revealed HR 127 bpm.  She took total of 60 mg short acting diltiazem and HR slowed.  Reviewed EKG and plan with Dr. Burt Knack, DOD.  We will start extended release diltiazem 120 daily and have her see EP provider in 1 week for consideration of cardioversion. Continue Tikosyn, Eliquis.  Persistent atrial fibrillation on chronic anticoagulation: Intermittent episodes of A-fib with  well-controlled ventricular rate in addition to atrial flutter. Advised her to hold her short-acting diltiazem 30 mg in the setting of starting diltiazem ER 120 mg once daily.   Hypertension: BP is well-controlled presently. She notes higher readings this morning. Suspect that elevation was secondary to concern over fast HR. We are adding long-acting diltiazem 120 mg once daily for management of atrial flutter. Advised her to continue to monitor BP and report to Dr. Oval Linsey next week.   Disposition: 1 week with [facility]. Keep your appointment next week with Dr. Oval Linsey  Medication Adjustments/Labs and Tests Ordered: Current medicines are reviewed at length with the patient today.  Concerns regarding medicines are outlined above.  Orders Placed This Encounter  Procedures   EKG 12-Lead   Meds ordered this encounter  Medications   diltiazem (CARDIZEM CD) 120 MG 24 hr capsule    Sig: Take 1 capsule (120 mg total) by mouth daily with lunch.    Dispense:  30 capsule    Refill:  1    Patient Instructions  Medication Instructions:  Your physician has recommended you make the following change in your medication:   HOLD the Diltiazem 30 mg  START Diltiazem 120 mg taking 1 daily  *If you need a refill on your cardiac medications before your next appointment, please call your pharmacy*   Lab Work: None ordered  If you have labs (blood work) drawn today and your tests are completely normal, you will receive your results only by: [place] (if you have MyChart) OR A paper copy in the mail If you have any lab test that is abnormal or we need to change your treatment, we will call you to review the results.  Testing/Procedures: None ordered   Follow-Up: At [facility], you and your health needs are our priority.  As part of our continuing mission to provide you with exceptional heart care, we have created designated Provider Care Teams.  These Care Teams include your primary  Cardiologist (physician) and Advanced Practice Providers (APPs -  Physician Assistants and Nurse Practitioners) who all work together to provide you with the care you need, when you need it.  We recommend signing up for the patient portal called "MyChart".  Sign up information is provided on this After Visit Summary.  MyChart is used to connect with patients for Virtual Visits (Telemedicine).  Patients are able to view lab/test results, encounter notes, upcoming appointments, etc.  Non-urgent messages can be sent to your provider as well.   To learn more about what you can do with MyChart, go to [email or web address].    Your next appointment:   1 week(s)  The format for your next appointment:   In Person  Provider:   You will follow up in the [facility] located at [facility]. Your provider will be: Roderic Palau, NP or Clint R. Marlene Lard, PA-C    Other Instructions   Important Information About Sugar         Signed, Emmaline Life, NP  08/26/2021 5:23 PM    [facility]

## 2021-08-26 NOTE — Patient Instructions (Signed)
Medication Instructions:  Your physician has recommended you make the following change in your medication:   HOLD the Diltiazem 30 mg  START Diltiazem 120 mg taking 1 daily  *If you need a refill on your cardiac medications before your next appointment, please call your pharmacy*   Lab Work: None ordered  If you have labs (blood work) drawn today and your tests are completely normal, you will receive your results only by: [place] (if you have MyChart) OR A paper copy in the mail If you have any lab test that is abnormal or we need to change your treatment, we will call you to review the results.   Testing/Procedures: None ordered   Follow-Up: At [facility], you and your health needs are our priority.  As part of our continuing mission to provide you with exceptional heart care, we have created designated Provider Care Teams.  These Care Teams include your primary Cardiologist (physician) and Advanced Practice Providers (APPs -  Physician Assistants and Nurse Practitioners) who all work together to provide you with the care you need, when you need it.  We recommend signing up for the patient portal called "MyChart".  Sign up information is provided on this After Visit Summary.  MyChart is used to connect with patients for Virtual Visits (Telemedicine).  Patients are able to view lab/test results, encounter notes, upcoming appointments, etc.  Non-urgent messages can be sent to your provider as well.   To learn more about what you can do with MyChart, go to [email or web address].    Your next appointment:   1 week(s)  The format for your next appointment:   In Person  Provider:   You will follow up in the [facility] located at [facility]. Your provider will be: Roderic Palau, NP or Clint R. Fenton, PA-C    Other Instructions   Important Information About Sugar

## 2021-08-26 NOTE — Telephone Encounter (Signed)
Spoke with the patient who states that her heart rate came back up again to 129. She did not take another diltiazem as shortly after her heart rate came back down to 70's. She states that she feels fatigued and little bit of shortness of breath. Will have her come in today for evaluation.

## 2021-08-26 NOTE — Telephone Encounter (Signed)
I have spoken with the patient. See telephone note.

## 2021-08-27 ENCOUNTER — Ambulatory Visit (HOSPITAL_COMMUNITY): Payer: Medicare Other | Admitting: Physician Assistant

## 2021-08-29 ENCOUNTER — Telehealth: Payer: Self-pay | Admitting: Physician Assistant

## 2021-08-29 NOTE — Telephone Encounter (Signed)
Pt went back into atrial flutter and was seen on the office on 08/02.   Her Dilt was changed to 120 mg CD, continue the PRN Dilt. Continue the Coreg 6.25 mg bid.  Last p.m., her heart rate was elevated and she took a short acting diltiazem 30 mg tablet.  BP this am was 82/59, she was light-headed and dizzy.   She is resting and getting her husband to help with anything and needs to be done around the house.  I recommended that she cut her carvedilol in half, she has some 3.125 mg tablets that she can take.  If her systolic blood pressure tomorrow is under 100, okay to hold the carvedilol until seen in the office.  She repeated the instructions and understands.  Keep the A-fib clinic appointment next week and follow-up with Dr. Oval Linsey as scheduled.  [name], PA-C 08/29/2021 12:50 PM

## 2021-09-01 ENCOUNTER — Encounter (HOSPITAL_COMMUNITY): Payer: Self-pay | Admitting: Physician Assistant

## 2021-09-01 ENCOUNTER — Ambulatory Visit (HOSPITAL_COMMUNITY)
Admission: RE | Admit: 2021-09-01 | Discharge: 2021-09-01 | Disposition: A | Discharge status: Home / Self Care | Payer: Medicare Other | Source: Ambulatory Visit | Attending: Physician Assistant | Admitting: Physician Assistant

## 2021-09-01 ENCOUNTER — Ambulatory Visit (HOSPITAL_COMMUNITY): Payer: Medicare Other | Admitting: Physician Assistant

## 2021-09-01 VITALS — BP 142/82 | HR 62 | Ht 60.0 in | Wt 162.8 lb

## 2021-09-01 DIAGNOSIS — D6869 Other thrombophilia: Secondary | ICD-10-CM | POA: Diagnosis not present

## 2021-09-01 DIAGNOSIS — I4892 Unspecified atrial flutter: Secondary | ICD-10-CM | POA: Diagnosis not present

## 2021-09-01 DIAGNOSIS — I4819 Other persistent atrial fibrillation: Secondary | ICD-10-CM | POA: Insufficient documentation

## 2021-09-01 DIAGNOSIS — I10 Essential (primary) hypertension: Secondary | ICD-10-CM | POA: Insufficient documentation

## 2021-09-01 DIAGNOSIS — I050 Rheumatic mitral stenosis: Secondary | ICD-10-CM | POA: Insufficient documentation

## 2021-09-01 DIAGNOSIS — G4733 Obstructive sleep apnea (adult) (pediatric): Secondary | ICD-10-CM | POA: Insufficient documentation

## 2021-09-01 DIAGNOSIS — I480 Paroxysmal atrial fibrillation: Secondary | ICD-10-CM | POA: Diagnosis not present

## 2021-09-01 LAB — BASIC METABOLIC PANEL
Anion gap: 5 (ref 5–15)
BUN: 13 mg/dL (ref 8–23)
CO2: 25 mmol/L (ref 22–32)
Calcium: 8.7 mg/dL — ABNORMAL LOW (ref 8.9–10.3)
Chloride: 108 mmol/L (ref 98–111)
Creatinine, Ser: 0.83 mg/dL (ref 0.44–1.00)
GFR, Estimated: >60 mL/min (ref >60)
Glucose, Bld: 98 mg/dL (ref 70–99)
Potassium: 4.2 mmol/L (ref 3.5–5.1)
Sodium: 138 mmol/L (ref 135–145)

## 2021-09-01 LAB — MAGNESIUM: Magnesium: 2.3 mg/dL (ref 1.7–2.4)

## 2021-09-01 NOTE — Progress Notes (Signed)
Primary Care Physician: Lawerance Cruel, MD Referring Physician: Dr. Gillian Scarce f/u   Mary Farrell is a 76 y.o. female with a h/o  persistent atrial fibrillation, mitral valve replacement with Maze procedure 11/2018 who was admitted for recurrent atrial arrhythmias 01/22/19 to 01/28/19.  She was admitted for persistent  atrial fibrillation/flutter.    She underwent cardioversion and was started on flecainide therapy.  She had recurrent atrial flutter despite flecainide therapy, and underwent Tikosyn loading while in-house.  She was maintained on dofetilide 500 mcg twice daily.  Her QTC remained around 490-500 ms.  The electrophysiology attending, Jolyn Nap, MD, reviewed her EKG prior to discharge and agreed with the dose of 500 mcg twice daily.   Now in the clinic she is staying in SR at 55 bpm. qtc is stable at 476 ms. No concerns voiced today. She is feeling improved. CHA2DS2VASc of at least 4 and continues on wafarin.   Follow up in the AF clinic 09/01/21. Patient seen 08/26/21 and was found to be in rapid atrial flutter. She was started on extended release diltiazem. She had some dizziness a couple days later associated with low BP and her carvedilol was decreased. She is back in SR today.   Today, she denies symptoms of palpitations, chest pain, shortness of breath, orthopnea, PND, lower extremity edema, dizziness, presyncope, syncope, or neurologic sequela. The patient is tolerating medications without difficulties and is otherwise without complaint today.   Past Medical History:  Diagnosis Date   Anxiety 06/29/2012   Aortic regurgitation 08/13/2015   Arthritis    "hands, wrists, back, feet, toes" (06/24/2017)   Atrial flutter ([place]) 09/18/2015   Atypical chest pain 05/13/2016   Chest pain    remote cath in 1996 with NORMAL coronaries noted   CHF (congestive heart failure) ([place])    Dyspnea 10/30/2010   Essential hypertension 09/05/2013   Exogenous obesity    history of    GERD  (gastroesophageal reflux disease)    Glaucoma, both eyes    Headache, migraine    "stopped in my 71's" (09/18/2015)   History of cardiovascular stress test 2007   showing no ischemia   Hypertension    Mitral stenosis and aortic insufficiency 06/23/2010   Mitral stenosis with insufficiency    Mitral stenosis with regurgitation    Murmur    of mitral stenosis and moderate aortic insufficiency       Nausea 05/01/2018   Obesity (BMI 30.0-34.9) 02/13/2021   OSA on CPAP    Palpitations    occasional   Paroxysmal A-fib ([place]) 06/24/2017   Pericardial effusion 09/18/2015   Persistent atrial fibrillation ([place]) 03/10/2017   Personal history of rheumatic heart disease    S/P minimally-invasive maze operation for atrial fibrillation 12/13/2018   Complete bilateral atrial lesion set using cryothermy and bipolar radiofrequency ablation with clipping of LA appendage via right mini-thoracotomy approach   S/P minimally-invasive mitral valve replacement with bioprosthetic valve 12/13/2018   31 mm [facility] Mitral stented bovine pericardial tissue valve   SBE (subacute bacterial endocarditis)    prophylaxis, patient unaware   Sliding hiatal hernia    Stress 02/13/2021   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   Vertigo 05/01/2018   Past Surgical History:  Procedure Laterality Date   ABDOMINAL HYSTERECTOMY  1975   APPENDECTOMY  1977   ATRIAL FIBRILLATION ABLATION N/A 03/10/2017   Procedure: ATRIAL FIBRILLATION ABLATION;  Surgeon: [name], MD;  Location: [place] [facility];  Service: Cardiovascular;  Laterality:  N/A;   ATRIAL FIBRILLATION ABLATION  06/24/2017   ATRIAL FIBRILLATION ABLATION N/A 06/24/2017   Procedure: ATRIAL FIBRILLATION ABLATION;  Surgeon: [name], MD;  Location: [place] [facility];  Service: Cardiovascular;  Laterality: N/A;   BUNIONECTOMY Right 2000s   CARDIAC CATHETERIZATION  01/13/1995   normal coronary anatomy and mild mitral stenosis and mild  pulmonary hypertension   CARDIOVERSION N/A 09/22/2015   Procedure: CARDIOVERSION;  Surgeon: Jerline Pain, MD;  Location: [place];  Service: Cardiovascular;  Laterality: N/A;   CARDIOVERSION N/A 10/25/2016   Procedure: CARDIOVERSION;  Surgeon: Fay Records, MD;  Location: [place];  Service: Cardiovascular;  Laterality: N/A;   CARDIOVERSION N/A 04/15/2017   Procedure: CARDIOVERSION;  Surgeon: Josue Hector, MD;  Location: [facility] ENDOSCOPY;  Service: Cardiovascular;  Laterality: N/A;   CARDIOVERSION N/A 05/16/2017   Procedure: CARDIOVERSION;  Surgeon: Pixie Casino, MD;  Location: [facility] ENDOSCOPY;  Service: Cardiovascular;  Laterality: N/A;   CARDIOVERSION N/A 01/24/2019   Procedure: CARDIOVERSION;  Surgeon: Pixie Casino, MD;  Location: [facility] ENDOSCOPY;  Service: Cardiovascular;  Laterality: N/A;   CATARACT EXTRACTION W/ INTRAOCULAR LENS  IMPLANT, BILATERAL Bilateral 2013   COLONOSCOPY  2013   EYE SURGERY Bilateral    cataracts   LAPAROSCOPIC CHOLECYSTECTOMY  1997   MINIMALLY INVASIVE MAZE PROCEDURE N/A 12/13/2018   Procedure: MINIMALLY INVASIVE MAZE PROCEDURE using a 45 MM AtriClip.;  Surgeon: Rexene Alberts, MD;  Location: [place];  Service: Open Heart Surgery;  Laterality: N/A;   MITRAL VALVE REPLACEMENT Right 12/13/2018   Procedure: MINIMALLY INVASIVE MITRAL VALVE (MV) REPLACEMENT using a Magna Mitral Ease 31 MM Valve.;  Surgeon: Rexene Alberts, MD;  Location: [place];  Service: Open Heart Surgery;  Laterality: Right;   RIGHT/LEFT HEART CATH AND CORONARY ANGIOGRAPHY N/A 10/18/2018   Procedure: RIGHT/LEFT HEART CATH AND CORONARY ANGIOGRAPHY;  Surgeon: Leonie Man, MD;  Location: [place] [facility];  Service: Cardiovascular;  Laterality: N/A;   TEE WITHOUT CARDIOVERSION N/A 06/23/2017   Procedure: TRANSESOPHAGEAL ECHOCARDIOGRAM (TEE);  Surgeon: Fay Records, MD;  Location: [place];  Service: Cardiovascular;  Laterality: N/A;   TEE WITHOUT CARDIOVERSION N/A 10/17/2018    Procedure: TRANSESOPHAGEAL ECHOCARDIOGRAM (TEE);  Surgeon: Jerline Pain, MD;  Location: [facility] ENDOSCOPY;  Service: Cardiovascular;  Laterality: N/A;   TEE WITHOUT CARDIOVERSION N/A 12/13/2018   Procedure: TRANSESOPHAGEAL ECHOCARDIOGRAM (TEE);  Surgeon: Rexene Alberts, MD;  Location: [place];  Service: Open Heart Surgery;  Laterality: N/A;   TONSILLECTOMY AND ADENOIDECTOMY  1990s   UVULOPALATOPHARYNGOPLASTY, TONSILLECTOMY AND SEPTOPLASTY  1990s    Current Outpatient Medications  Medication Sig Dispense Refill   acetaminophen (TYLENOL) 500 MG tablet Take 1,000 mg by mouth every 6 (six) hours as needed for moderate pain or headache.      ALPRAZolam (XANAX) 0.25 MG tablet Take 0.125 mg by mouth 2 (two) times daily as needed for anxiety.      apixaban (ELIQUIS) 5 MG TABS tablet TAKE 1 TABLET BY MOUTH  TWICE DAILY 180 tablet 1   Calcium Citrate-Vitamin D (CALCIUM CITRATE + D3) 200-250 MG-UNIT TABS Take 1 tablet by mouth daily with supper.     carvedilol (COREG) 6.25 MG tablet TAKE 1 TABLET BY MOUTH TWICE  DAILY 180 tablet 3   cetirizine (ZYRTEC) 10 MG tablet Take 10 mg by mouth at bedtime.      Cholecalciferol (VITAMIN D) 2000 UNITS CAPS Take 2,000 Units by mouth daily.     conjugated estrogens (PREMARIN)  vaginal cream Place 1 Applicatorful vaginally daily as needed (irritation).      Cyanocobalamin (VITAMIN B12) 1000 MCG TBCR Take 1,000 mcg by mouth daily.     diltiazem (CARDIZEM CD) 120 MG 24 hr capsule Take 1 capsule (120 mg total) by mouth daily with lunch. 30 capsule 1   diltiazem (CARDIZEM) 30 MG tablet Take 1 tablet (30 mg total) by mouth 4 (four) times daily as needed (for heart rates greater than 100). 30 tablet 1   dofetilide (TIKOSYN) 500 MCG capsule Take 1 capsule (500 mcg total) by mouth 2 (two) times daily. 180 capsule 3   dorzolamide-timolol (COSOPT) 22.3-6.8 MG/ML ophthalmic solution Place 1 drop into both eyes daily.     famotidine (PEPCID) 20 MG tablet Take 20 mg by mouth at bedtime.      fluticasone (FLONASE) 50 MCG/ACT nasal spray Place 1 spray into both nostrils daily as needed for allergies.     furosemide (LASIX) 20 MG tablet Take 1 tablet (20 mg total) by mouth daily as needed for fluid. For >3lb/overnight or >5lb/weekly note dose 30 tablet 4   gabapentin (NEURONTIN) 300 MG capsule Take 300 mg by mouth 3 (three) times daily.      hydrALAZINE (APRESOLINE) 25 MG tablet TAKE 1 TABLET DAILY AS NEEDED FOR BLOOD PRESSURE ABOVE 140 30 tablet 5   latanoprost (XALATAN) 0.005 % ophthalmic solution Place 1 drop into both eyes at bedtime.   6   magnesium oxide (MAG-OX) 400 MG tablet Take 0.5 tablets (200 mg total) by mouth daily. 45 tablet 2   meclizine (ANTIVERT) 25 MG tablet Take 1 tablet by mouth as needed.     Multiple Vitamin (MULTIVITAMIN WITH MINERALS) TABS tablet Take 1 tablet by mouth daily.     NON FORMULARY Apply 1 application topically 2 (two) times daily as needed (for eczema). Traimcinolone/[employer] Moist Cream     oxybutynin (DITROPAN-XL) 10 MG 24 hr tablet Take 10 mg by mouth daily.     pantoprazole (PROTONIX) 40 MG tablet Take 1 tablet by mouth daily.     polyethylene glycol (MIRALAX / GLYCOLAX) 17 g packet Take 17 g by mouth daily as needed for moderate constipation.      potassium chloride SA (KLOR-CON) 20 MEQ tablet Take 20 mEq by mouth See admin instructions. Take as needed with furosemide     PRESCRIPTION MEDICATION Inhale into the lungs at bedtime. CPAP     valsartan (DIOVAN) 320 MG tablet TAKE 1 TABLET BY MOUTH DAILY 90 tablet 2   No current facility-administered medications for this encounter.    Allergies  Allergen Reactions   Tizanidine Nausea Only   Amoxicillin Diarrhea    Has patient had a PCN reaction causing immediate rash, facial/tongue/throat swelling, SOB or lightheadedness with hypotension: no Has patient had a PCN reaction causing severe rash involving mucus membranes or skin necrosis: no Has patient had a PCN reaction that required  hospitalization: no Has patient had a PCN reaction occurring within the last 10 years: no If all of the above answers are "NO", then may proceed with Cephalosporin use.    Metoprolol Tartrate Other (See Comments)    Mouth sores "mouth tastes like mold"   Oxaprozin Nausea Only    Social History   Socioeconomic History   Marital status: Married    Spouse name: Not on file   Number of children: Not on file   Years of education: Not on file   Highest education level: Not on file  Occupational  History   Occupation: Retired  Tobacco Use   Smoking status: Never   Smokeless tobacco: Never   Tobacco comments:    Never smoke 09/01/21  Vaping Use   Vaping Use: Never used  Substance and Sexual Activity   Alcohol use: Yes    Comment: rarely   Drug use: No   Sexual activity: Not Currently  Other Topics Concern   Not on file  Social History Narrative   Lives with husband in [place].   Social Determinants of Health   Financial Resource Strain: Not on file  Food Insecurity: Not on file  Transportation Needs: Not on file  Physical Activity: Not on file  Stress: Not on file  Social Connections: Not on file  Intimate Partner Violence: Not on file    Family History  Problem Relation Age of Onset   Heart attack Mother    Hypertension Brother    Kidney disease Brother    Diabetes Brother    Heart failure Maternal Grandfather     ROS- All systems are reviewed and negative except as per the HPI above  Physical Exam: Vitals:   09/01/21 1452  BP: (!) 142/82  Pulse: 62  Weight: 73.8 kg  Height: 5' (1.524 m)   Wt Readings from Last 3 Encounters:  09/01/21 73.8 kg  08/26/21 73.5 kg  03/30/21 75.1 kg    Labs: Lab Results  Component Value Date   NA 140 03/30/2021   K 4.4 03/30/2021   CL 101 03/30/2021   CO2 27 03/30/2021   GLUCOSE 91 03/30/2021   BUN 13 03/30/2021   CREATININE 0.89 03/30/2021   CALCIUM 9.3 03/30/2021   MG 2.2 03/30/2021   Lab Results   Component Value Date   INR 2.6 02/23/2019   No results found for: "CHOL", "HDL", "LDLCALC", "TRIG"   GEN- The patient is a well appearing elderly obese female, alert and oriented x 3 today.   HEENT-head normocephalic, atraumatic, sclera clear, conjunctiva pink, hearing intact, trachea midline. Lungs- Clear to ausculation bilaterally, normal work of breathing Heart- Regular rate and rhythm, no murmurs, rubs or gallops  GI- soft, NT, ND, + BS Extremities- no clubbing, cyanosis, or edema MS- no significant deformity or atrophy Skin- no rash or lesion Psych- euthymic mood, full affect Neuro- strength and sensation are intact   EKG-  SR Vent. rate 62 BPM PR interval 208 ms QRS duration 90 ms QT/QTcB 462/468 ms   Epic  records reviewed   Assessment and Plan: 1. Persistent Afib/flutter  S/p afib ablation x 2 2019 S/p Maze/ valve repair 11/2018 Patient converted back to SR. We discussed rhythm control options. Recall, she had significant GI side effects with amiodarone previously. She would rather not go back to this medication if at all possible. Question if she would be a candidate for a 3rd ablation, she is agreeable to discussing with Dr Curt Bears.  Continue Tikosyn 500 mcg BID. QT stable. Check bmet/mag today. Continue Eliquis 5 mg BID Continue carvedilol 3.125 mg BID Continue diltiazem 120 mg daily  2. CHA2DS2VASc score is 4 Continue Eliquis 5 mg BID  3. HTN Stable, continue present medications.   4. OSA Encouraged compliance with CPAP therapy.  5. Mitral stenosis Rheumatic, s/p MVR with bioprosthetic valve.   Follow up with Dr Oval Linsey as scheduled. Will also have her follow up with Dr Curt Bears.    [facility] [address] [place], [place] [address] [phone]

## 2021-09-03 ENCOUNTER — Encounter (HOSPITAL_BASED_OUTPATIENT_CLINIC_OR_DEPARTMENT_OTHER): Payer: Self-pay | Admitting: Cardiovascular Disease

## 2021-09-03 ENCOUNTER — Ambulatory Visit (INDEPENDENT_AMBULATORY_CARE_PROVIDER_SITE_OTHER): Payer: Medicare Other | Admitting: Cardiovascular Disease

## 2021-09-03 ENCOUNTER — Ambulatory Visit (HOSPITAL_BASED_OUTPATIENT_CLINIC_OR_DEPARTMENT_OTHER): Payer: Medicare Other | Admitting: Cardiovascular Disease

## 2021-09-03 DIAGNOSIS — Z953 Presence of xenogenic heart valve: Secondary | ICD-10-CM | POA: Diagnosis not present

## 2021-09-03 DIAGNOSIS — I4819 Other persistent atrial fibrillation: Secondary | ICD-10-CM

## 2021-09-03 DIAGNOSIS — I10 Essential (primary) hypertension: Secondary | ICD-10-CM | POA: Diagnosis not present

## 2021-09-03 MED ORDER — VALSARTAN 160 MG PO TABS: 160.0000 mg | ORAL_TABLET | Freq: Every day | ORAL | 3 refills | Status: DC

## 2021-09-03 MED ORDER — POTASSIUM CHLORIDE CRYS ER 20 MEQ PO TBCR: 20.0000 meq | EXTENDED_RELEASE_TABLET | ORAL | 2 refills | Status: DC

## 2021-09-03 NOTE — Assessment & Plan Note (Addendum)
BLood pressure in the office today was elevated.  At home it has been quite low.  It was 111/52 on her machine this AM.  We checked her machine without ours in the office and it was accurate.  Therefore, we will base our decisions on her home readings.  Given that she has been so fatigued, I think her low blood pressures may be contributing.  We will reduce valsartan to 160 mg daily.  She has hydralazine to take on an as-needed basis.  Continue diltiazem and carvedilol at 3.125 mg twice daily.  This we will have more room for nodal agents if she goes back in atrial fibrillation/flutter.

## 2021-09-03 NOTE — Progress Notes (Addendum)
Cardiology Office Note    Date:  09/03/2021   ID:  Jood, Retana 07/27/1945, MRN [id number]  Patient Location: Home Provider Location: Office/Clinic  PCP:  Lawerance Cruel, MD  Cardiologist:  None  Electrophysiologist:  Will Meredith Leeds, MD   Evaluation Performed:  Follow-Up Visit  Chief Complaint:  hyertension  History of Present Illness:    The patient does not have symptoms concerning for COVID-19 infection (fever, chills, cough, or new shortness of breath).   History of Present Illness: MARVENE STROHM is a 76 y.o. female with moderate aortic regurgitation, Rheumatic mitral stenosis s/p MVR, chronic pericardial effusion, paroxysmal atrial flutter s/p ablation 05/2017, MAZE 11/2018 and hypertension who presents for follow up.  Ms. Oman was previously a patient of Dr. Mare Ferrari.  She followed up with Truitt Merle on 04/2015 due to shortness of breath that she felt was getting worse.  She was referred for an echo 04/2015 that revealed LVEF 55-60% with moderate AR and mild-moderate MS.  There was also a moderate pericardial effusion but no evidence of tamponade.  She had a heart cath 12/1994 that revealed normal coronaries with mild MS and mild pulmonary hypertension.  She also had a Cardiolite in 2007 that was negative for ischemia.  She was seen in clinic 07/2015 and reported persistent shortness of breath with minimal exertion and chest discomfort.  She was referred for exercise Myoview 08/26/15 that revealed LVEF 60% and no perfusion defects.    Ms. Clemence was admitted 08/2015 with atrial flutter.  She had an echo that showed a persistent moderate-severe pericardial effusion but no tamponade.  She was started on amiodarone, which she did not tolerate due to nausea.  She underwent DCCV on 09/22/15 and then was started on flecainide.  She continued to report shortness of breath and was again referred for an echo that revealed LVEF 60-65% with grade 2 diastolic dysfunction, mild aortic  regurgitation, and moderate mitral stenosis. She had a moderate pericardial effusion localized to the inferior and inferolateral walls. There was no evidence of tamponade.  Ms. Lucci had an outpatient DCCV on 10/1 and converted with one shock at [place].  She developed recurrent atrial fibrillation and was referred to EP.  She underwent afib/flutter ablation with Dr. Curt Bears on 06/24/17. She was started on flecainide but had recurent disease and was started on Tikosyn.  Her mitral valve was replaced 11/2018 (31 mm [facility] bioprosthetic).  She had a MAZE at that time. She later was Dr. Curt Bears and was in a junctional rhythm so diltiazem was discontinued.  She had recurrent episode of atrial fibrillation and took a dose of diltiazem she subsequently converted back to sinus rhythm.  Ms. Hasler blood pressure was elevated so she switched from losartan to valsartan.  She also had chest pain that was thought to be atypical, especially in setting of normal coronaries prior to her surgery.  It was thought that her symptoms may be related to GERD and her PPI was increased.  Hydralazine was added for blood pressures over 140.  She saw Dr. Curt Bears 04/2020 and carvedilol was reduced due to bradycardia and fatigue.  Later that month she followed up with Sande Rives, PA for episodes of persistent tachycardia in the 140s.  She reviewed a Kardia mobile strip and was thought to have been in atrial flutter.  Carvedilol was increased to 9.375 mg twice daily.  She followed up later that summer and was feeling better but dealing with a lot of  stressors, including her husband's dementia diagnosis.  She followed up with EP 03/2021 and her blood pressure was elevated.  She thought that was due to stress.  She had recurrent atrial flutter 08/26/2021.  Diltiazem was increased and carvedilol was decreased.  She was seen in the atrial fibrillation clinic 8/8 and was back in sinus rhythm.  Plans were made for her to discuss a potential  repeat ablation with Dr. Curt Bears.  She is hesitant to have   [place] she has been feeling tired and lightheaded.  When in atrial fibrillation she took extra diltiazem which helped her heart rate.  She had to reduce carvedilol to 3.'125mg'$ .  Her BP at home has been in the 90s-120/50-60s.  She continues to feel tired and fatigued though her heart is back in rhythm.  She is very afraid to do anything because she may go  back into atrial fibrillation.   Past Medical History:  Diagnosis Date   Anxiety 06/29/2012   Aortic regurgitation 08/13/2015   Arthritis    "hands, wrists, back, feet, toes" (06/24/2017)   Atrial flutter ([place]) 09/18/2015   Atypical chest pain 05/13/2016   Chest pain    remote cath in 1996 with NORMAL coronaries noted   CHF (congestive heart failure) ([place])    Dyspnea 10/30/2010   Essential hypertension 09/05/2013   Exogenous obesity    history of    GERD (gastroesophageal reflux disease)    Glaucoma, both eyes    Headache, migraine    "stopped in my 30's" (09/18/2015)   History of cardiovascular stress test 2007   showing no ischemia   Hypertension    Mitral stenosis and aortic insufficiency 06/23/2010   Mitral stenosis with insufficiency    Mitral stenosis with regurgitation    Murmur    of mitral stenosis and moderate aortic insufficiency       Nausea 05/01/2018   Obesity (BMI 30.0-34.9) 02/13/2021   OSA on CPAP    Palpitations    occasional   Paroxysmal A-fib ([place]) 06/24/2017   Pericardial effusion 09/18/2015   Persistent atrial fibrillation ([place]) 03/10/2017   Personal history of rheumatic heart disease    S/P minimally-invasive maze operation for atrial fibrillation 12/13/2018   Complete bilateral atrial lesion set using cryothermy and bipolar radiofrequency ablation with clipping of LA appendage via right mini-thoracotomy approach   S/P minimally-invasive mitral valve replacement with bioprosthetic valve 12/13/2018   31 mm [facility] Mitral stented bovine pericardial  tissue valve   SBE (subacute bacterial endocarditis)    prophylaxis, patient unaware   Sliding hiatal hernia    Stress 02/13/2021   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   Vertigo 05/01/2018    Past Surgical History:  Procedure Laterality Date   ABDOMINAL HYSTERECTOMY  1975   APPENDECTOMY  1977   ATRIAL FIBRILLATION ABLATION N/A 03/10/2017   Procedure: ATRIAL FIBRILLATION ABLATION;  Surgeon: [name], MD;  Location: [place] CV LAB;  Service: Cardiovascular;  Laterality: N/A;   ATRIAL FIBRILLATION ABLATION  06/24/2017   ATRIAL FIBRILLATION ABLATION N/A 06/24/2017   Procedure: ATRIAL FIBRILLATION ABLATION;  Surgeon: [name], MD;  Location: [place] CV LAB;  Service: Cardiovascular;  Laterality: N/A;   BUNIONECTOMY Right 2000s   CARDIAC CATHETERIZATION  01/13/1995   normal coronary anatomy and mild mitral stenosis and mild pulmonary hypertension   CARDIOVERSION N/A 09/22/2015   Procedure: CARDIOVERSION;  Surgeon: Jerline Pain, MD;  Location: [facility] END[facility];  Service: Cardiovascular;  Laterality: N/A;   CARDIOVERSION N/A 10/25/2016  Procedure: CARDIOVERSION;  Surgeon: Fay Records, MD;  Location: [place];  Service: Cardiovascular;  Laterality: N/A;   CARDIOVERSION N/A 04/15/2017   Procedure: CARDIOVERSION;  Surgeon: Josue Hector, MD;  Location: [facility] ENDOSCOPY;  Service: Cardiovascular;  Laterality: N/A;   CARDIOVERSION N/A 05/16/2017   Procedure: CARDIOVERSION;  Surgeon: Pixie Casino, MD;  Location: [facility] ENDOSCOPY;  Service: Cardiovascular;  Laterality: N/A;   CARDIOVERSION N/A 01/24/2019   Procedure: CARDIOVERSION;  Surgeon: Pixie Casino, MD;  Location: [facility] ENDOSCOPY;  Service: Cardiovascular;  Laterality: N/A;   CATARACT EXTRACTION W/ INTRAOCULAR LENS  IMPLANT, BILATERAL Bilateral 2013   COLONOSCOPY  2013   EYE SURGERY Bilateral    cataracts   LAPAROSCOPIC CHOLECYSTECTOMY  1997   MINIMALLY INVASIVE MAZE PROCEDURE N/A 12/13/2018    Procedure: MINIMALLY INVASIVE MAZE PROCEDURE using a 45 MM AtriClip.;  Surgeon: Rexene Alberts, MD;  Location: [place];  Service: Open Heart Surgery;  Laterality: N/A;   MITRAL VALVE REPLACEMENT Right 12/13/2018   Procedure: MINIMALLY INVASIVE MITRAL VALVE (MV) REPLACEMENT using a Magna Mitral Ease 31 MM Valve.;  Surgeon: Rexene Alberts, MD;  Location: [place];  Service: Open Heart Surgery;  Laterality: Right;   RIGHT/LEFT HEART CATH AND CORONARY ANGIOGRAPHY N/A 10/18/2018   Procedure: RIGHT/LEFT HEART CATH AND CORONARY ANGIOGRAPHY;  Surgeon: Leonie Man, MD;  Location: [place] CV LAB;  Service: Cardiovascular;  Laterality: N/A;   TEE WITHOUT CARDIOVERSION N/A 06/23/2017   Procedure: TRANSESOPHAGEAL ECHOCARDIOGRAM (TEE);  Surgeon: Fay Records, MD;  Location: [place];  Service: Cardiovascular;  Laterality: N/A;   TEE WITHOUT CARDIOVERSION N/A 10/17/2018   Procedure: TRANSESOPHAGEAL ECHOCARDIOGRAM (TEE);  Surgeon: Jerline Pain, MD;  Location: [facility] ENDOSCOPY;  Service: Cardiovascular;  Laterality: N/A;   TEE WITHOUT CARDIOVERSION N/A 12/13/2018   Procedure: TRANSESOPHAGEAL ECHOCARDIOGRAM (TEE);  Surgeon: Rexene Alberts, MD;  Location: [place];  Service: Open Heart Surgery;  Laterality: N/A;   TONSILLECTOMY AND ADENOIDECTOMY  1990s   UVULOPALATOPHARYNGOPLASTY, TONSILLECTOMY AND SEPTOPLASTY  1990s     Current Outpatient Medications  Medication Sig Dispense Refill   acetaminophen (TYLENOL) 500 MG tablet Take 1,000 mg by mouth every 6 (six) hours as needed for moderate pain or headache.      ALPRAZolam (XANAX) 0.25 MG tablet Take 0.125 mg by mouth 2 (two) times daily as needed for anxiety.      apixaban (ELIQUIS) 5 MG TABS tablet TAKE 1 TABLET BY MOUTH  TWICE DAILY 180 tablet 1   Calcium Citrate-Vitamin D (CALCIUM CITRATE + D3) 200-250 MG-UNIT TABS Take 1 tablet by mouth daily with supper.     carvedilol (COREG) 3.125 MG tablet Take 3.125 mg by mouth 2 (two) times daily with a meal.      cetirizine (ZYRTEC) 10 MG tablet Take 10 mg by mouth at bedtime.      Cholecalciferol (VITAMIN D) 2000 UNITS CAPS Take 2,000 Units by mouth daily.     conjugated estrogens (PREMARIN) vaginal cream Place 1 Applicatorful vaginally daily as needed (irritation).      Cyanocobalamin (VITAMIN B12) 1000 MCG TBCR Take 1,000 mcg by mouth daily.     diltiazem (CARDIZEM CD) 120 MG 24 hr capsule Take 1 capsule (120 mg total) by mouth daily with lunch. 30 capsule 1   diltiazem (CARDIZEM) 30 MG tablet Take 1 tablet (30 mg total) by mouth 4 (four) times daily as needed (for heart rates greater than 100). 30 tablet 1   dofetilide (TIKOSYN) 500 MCG capsule Take  1 capsule (500 mcg total) by mouth 2 (two) times daily. 180 capsule 3   dorzolamide-timolol (COSOPT) 22.3-6.8 MG/ML ophthalmic solution Place 1 drop into both eyes daily.     famotidine (PEPCID) 20 MG tablet Take 20 mg by mouth at bedtime.     fluticasone (FLONASE) 50 MCG/ACT nasal spray Place 1 spray into both nostrils daily as needed for allergies.     furosemide (LASIX) 20 MG tablet Take 1 tablet (20 mg total) by mouth daily as needed for fluid. For >3lb/overnight or >5lb/weekly note dose 30 tablet 4   gabapentin (NEURONTIN) 300 MG capsule Take 300 mg by mouth 3 (three) times daily.      hydrALAZINE (APRESOLINE) 25 MG tablet TAKE 1 TABLET DAILY AS NEEDED FOR BLOOD PRESSURE ABOVE 140 30 tablet 5   latanoprost (XALATAN) 0.005 % ophthalmic solution Place 1 drop into both eyes at bedtime.   6   magnesium oxide (MAG-OX) 400 MG tablet Take 0.5 tablets (200 mg total) by mouth daily. 45 tablet 2   meclizine (ANTIVERT) 25 MG tablet Take 1 tablet by mouth as needed.     Multiple Vitamin (MULTIVITAMIN WITH MINERALS) TABS tablet Take 1 tablet by mouth daily.     NON FORMULARY Apply 1 application topically 2 (two) times daily as needed (for eczema). Traimcinolone/[employer] Moist Cream     oxybutynin (DITROPAN-XL) 10 MG 24 hr tablet Take 10 mg by mouth daily.      pantoprazole (PROTONIX) 40 MG tablet Take 1 tablet by mouth daily.     polyethylene glycol (MIRALAX / GLYCOLAX) 17 g packet Take 17 g by mouth daily as needed for moderate constipation.      PRESCRIPTION MEDICATION Inhale into the lungs at bedtime. CPAP     potassium chloride SA (KLOR-CON M) 20 MEQ tablet Take 1 tablet (20 mEq total) by mouth See admin instructions. Take as needed with furosemide 90 tablet 2   valsartan (DIOVAN) 160 MG tablet Take 1 tablet (160 mg total) by mouth daily. 90 tablet 3   No current facility-administered medications for this visit.    Allergies:   Tizanidine, Amoxicillin, Metoprolol tartrate, and Oxaprozin    Social History:  The patient  reports that she has never smoked. She has never used smokeless tobacco. She reports current alcohol use. She reports that she does not use drugs.   Family History:  The patient's  family history includes Diabetes in her brother; Heart attack in her mother; Heart failure in her maternal grandfather; Hypertension in her brother; Kidney disease in her brother.    ROS:  Please see the history of present illness.   Otherwise, review of systems are positive for none.   All other systems are reviewed and negative.    PHYSICAL EXAM: VS:  BP (!) 154/82 (BP Location: Right Arm, Patient Position: Sitting, Cuff Size: Large)   Pulse 61   Ht 5' (1.524 m)   Wt 165 lb 12.8 oz (75.2 kg)   BMI 32.38 kg/m  , BMI Body mass index is 32.38 kg/m. GENERAL:  Well appearing HEENT: Pupils equal round and reactive, fundi not visualized, oral mucosa unremarkable NECK:  No jugular venous distention, waveform within normal limits, carotid upstroke brisk and symmetric, no bruits LUNGS:  Clear to auscultation bilaterally HEART:  RRR.  PMI not displaced or sustained,S1 and S2 within normal limits, no S3, no S4, no clicks, no rubs, II/VI  murmur ABD:  Flat, positive bowel sounds normal in frequency in pitch, no bruits, no  rebound, no guarding, no  midline pulsatile mass, no hepatomegaly, no splenomegaly EXT:  2 plus pulses throughout, no edema, no cyanosis no clubbing SKIN:  No rashes no nodules NEURO:  Cranial nerves II through XII grossly intact, motor grossly intact throughout PSYCH:  Cognitively intact, oriented to person place and time   EKG:  EKG is ordered today. The ekg ordered 05/07/15.  Sinus bradycardia rate 57 bpm. 05/13/16: Sinus rhythm. Rate 66 bpm. 09/09/16: Sinus rhythm. Rate 64 bpm.   10/26/16: Sinus rhythm.  Rate 74 bpm. 09/03/21: Sinus rhythm. Rate 61 bpm.  RAD.  Exercise Myoveiw 08/26/15: The left ventricular ejection fraction is normal (55-65%). Nuclear stress EF: 60%. Blood pressure demonstrated a hypertensive response to exercise. Upsloping ST segment depression ST segment depression of 1 mm was noted during stress in the II, III, V6, V5 and aVF leads. This is a low risk study.   No reversible ischemia. LVEF 60% with normal wall motion. Fair exercise tolerance. No chest pain. This is a low risk study.   TTE 01/23/19: 1. Left ventricular ejection fraction, by visual estimation, is 55 to  60%. The left ventricle has normal function. Left ventricular septal wall  thickness was mildly increased. Moderately increased left ventricular  posterior wall thickness. There is  mildly increased left ventricular hypertrophy.   2. Left ventricular diastolic parameters are indeterminate.   3. The left ventricle demonstrates regional wall motion abnormalities.   4. Elevated LVEDP. Hypokinesis of the basal septum consistent with  post-operative state.   5. Global right ventricle has normal systolic function.The right  ventricular size is normal. No increase in right ventricular wall  thickness.   6. Left atrial size was severely dilated.   7. Right atrial size was normal.   8. The mitral valve has been repaired/replaced. No evidence of mitral  valve regurgitation. No evidence of mitral stenosis.   9. The tricuspid valve  is normal in structure.  10. The aortic valve is tricuspid. Aortic valve regurgitation is mild. No  evidence of aortic valve sclerosis or stenosis.  11. The pulmonic valve was normal in structure. Pulmonic valve  regurgitation is not visualized.  12. Normal pulmonary artery systolic pressure.  13. The inferior vena cava is normal in size with <50% respiratory  variability, suggesting right atrial pressure of 8 mmHg.   09/24/20: Echo  1. Left ventricular ejection fraction, by estimation, is 60 to 65%. The  left ventricle has normal function. The left ventricle has no regional  wall motion abnormalities. Left ventricular diastolic parameters are  indeterminate.   2. Right ventricular systolic function is normal. The right ventricular  size is normal. There is moderately elevated pulmonary artery systolic  pressure.   3. Left atrial size was moderately dilated.   4. The mitral valve has been repaired/replaced. Mild mitral valve  regurgitation. No evidence of mitral stenosis. The mean mitral valve  gradient is 2.8 mmHg with average heart rate of 55 bpm. Echo findings are  consistent with normal structure and  function of the mitral valve prosthesis.   5. The aortic valve is tricuspid. There is mild calcification of the  aortic valve. There is mild thickening of the aortic valve. Aortic valve  regurgitation is mild. Mild aortic valve sclerosis is present, with no  evidence of aortic valve stenosis.  Aortic valve area, by VTI measures 2.29 cm. Aortic valve mean gradient  measures 6.5 mmHg. Aortic valve Vmax measures 1.69 m/s.   6. The inferior vena cava is normal  in size with greater than 50%  respiratory variability, suggesting right atrial pressure of 3 mmHg.   Recent Labs: 03/30/2021: Platelets 191 04/03/2021: Hemoglobin 10.0 09/01/2021: BUN 13; Creatinine, Ser 0.83; Magnesium 2.3; Potassium 4.2; Sodium 138   07/05/16: Total cholesterol 167, triglycerides 159, HDL 47, LDL 88 TSH  2.2 Sodium 141, potassium 3.8, BUN 14, creatinine 0.77 AST 13, ALT 14  08/03/2017: Total cholesterol 215, triglycerides 121, HDL 67, LDL 124  07/24/2019:  Sodium 137, potassium 4.4, BUN 14, creatinine 0.72 AST 14, ALT 15 Total cholesterol 168, triglycerides 94, HDL 62, LDL 89 TSH 2.39  Lipid Panel No results found for: "CHOL", "TRIG", "HDL", "CHOLHDL", "VLDL", "LDLCALC", "LDLDIRECT"    Wt Readings from Last 3 Encounters:  09/03/21 165 lb 12.8 oz (75.2 kg)  09/01/21 162 lb 12.8 oz (73.8 kg)  08/26/21 162 lb (73.5 kg)     ASSESSMENT AND PLAN:  Essential hypertension BLood pressure in the office today was elevated.  At home it has been quite low.  It was 111/52 on her machine this AM.  We checked her machine without ours in the office and it was accurate.  Therefore, we will base our decisions on her home readings.  Given that she has been so fatigued, I think her low blood pressures may be contributing.  We will reduce valsartan to 160 mg daily.  She has hydralazine to take on an as-needed basis.  Continue diltiazem and carvedilol at 3.125 mg twice daily.  This we will have more room for nodal agents if she goes back in atrial fibrillation/flutter.  Persistent atrial fibrillation/flutter Currently in sinus rhythm.  She is struggling with whether or not she wants to do a repeat ablation.  Continue Eliquis, carvedilol, diltiazem, and dofetilide.  Reducing her valsartan so we have more room to titrate her nodal agents as needed.  S/P minimally-invasive mitral valve replacement with bioprosthetic valve Valve stable on echo in 2022.  She has no heart failure symptoms.  Current medicines are reviewed at length with the patient today.  The patient does not have concerns regarding medicines.  The following changes have been made:    Labs/ tests ordered today include:   No orders of the defined types were placed in this encounter.   Disposition:   FU with Aarav Burgett C. Oval Linsey, MD, [facility]  in 2 months     Signed, Morrie Daywalt C. Oval Linsey, MD, [facility]  09/03/2021 2:54 PM    [facility]

## 2021-09-03 NOTE — Addendum Note (Signed)
Addended by: Vennie Homans on: 09/03/2021 03:43 PM   Modules accepted: Orders

## 2021-09-03 NOTE — Patient Instructions (Signed)
Medication Instructions:  DECREASE VALSARTAN TO 160 MG DAILY   *If you need a refill on your cardiac medications before your next appointment, please call your pharmacy*  Lab Work: NONE  Testing/Procedures: NONE  Follow-Up: At [facility], you and your health needs are our priority.  As part of our continuing mission to provide you with exceptional heart care, we have created designated Provider Care Teams.  These Care Teams include your primary Cardiologist (physician) and Advanced Practice Providers (APPs -  Physician Assistants and Nurse Practitioners) who all work together to provide you with the care you need, when you need it.  We recommend signing up for the patient portal called "MyChart".  Sign up information is provided on this After Visit Summary.  MyChart is used to connect with patients for Virtual Visits (Telemedicine).  Patients are able to view lab/test results, encounter notes, upcoming appointments, etc.  Non-urgent messages can be sent to your provider as well.   To learn more about what you can do with MyChart, go to [email or web address].    Your next appointment:   2 month(s)  The format for your next appointment:   In Person  Provider:   Skeet Latch, MD or Laurann Montana, NP{

## 2021-09-03 NOTE — Assessment & Plan Note (Signed)
Currently in sinus rhythm.  She is struggling with whether or not she wants to do a repeat ablation.  Continue Eliquis, carvedilol, diltiazem, and dofetilide.  Reducing her valsartan so we have more room to titrate her nodal agents as needed.

## 2021-09-03 NOTE — Assessment & Plan Note (Signed)
Valve stable on echo in 2022.  She has no heart failure symptoms.

## 2021-09-04 ENCOUNTER — Encounter: Payer: Self-pay | Admitting: Cardiology

## 2021-09-04 ENCOUNTER — Telehealth (HOSPITAL_COMMUNITY): Payer: Self-pay

## 2021-09-04 ENCOUNTER — Telehealth: Payer: Self-pay | Admitting: Cardiovascular Disease

## 2021-09-04 ENCOUNTER — Encounter (HOSPITAL_BASED_OUTPATIENT_CLINIC_OR_DEPARTMENT_OTHER): Payer: Self-pay

## 2021-09-04 NOTE — Telephone Encounter (Signed)
Patient c/o Palpitations:  High priority if patient c/o lightheadedness, shortness of breath, or chest pain  How long have you had palpitations/irregular HR/ Afib? Are you having the symptoms now?  For the last 10 mins  Are you currently experiencing lightheadedness, SOB or CP? lightheaded  Do you have a history of afib (atrial fibrillation) or irregular heart rhythm? Yes  Have you checked your BP or HR? (document readings if available):  135/76 HR - 128  Are you experiencing any other symptoms? No

## 2021-09-04 NOTE — Telephone Encounter (Signed)
RN returned call to patient to discuss the following,   Patient c/o Palpitations:  High priority if patient c/o lightheadedness, shortness of breath, or chest pain   How long have you had palpitations/irregular HR/ Afib? Are you having the symptoms now?  For the last 10 mins   Are you currently experiencing lightheadedness, SOB or CP? lightheaded   Do you have a history of afib (atrial fibrillation) or irregular heart rhythm? Yes   Have you checked your BP or HR? (document readings if available):  135/76 HR - 128   Are you experiencing any other symptoms? No  Patient states that she saw Dr. Oval Linsey 8/10 and A fib clinic on Tuesday, in [place] both days. Was started on Dilt '120mg'$  ER at lunch. Patient states she has been good at doing that and forgot to take her dose. States she started to feel palpitations, used pulse ox HR was 150, took bp at 3:35 after taking dilt 135/76 HR 128. Contacted dr. Curt Bears via [facility] Ping and was given a direct contact number for A. Fib clinic. Was instructed to wait 1 hour and see where she is at, then take a '30mg'$  dilt, but patient has not done that yet because she was not sure what to do if the '30mg'$  dose doesn't work and wanted to get a game plan together before the office closes at 5.   Please call patient with a plan!

## 2021-09-04 NOTE — Telephone Encounter (Signed)
Patient called stating she forgot to take Diltiazem '120mg'$  at lunch time. She stated that she just took it before calling. I informed her that she can drink a glass of water and try to relax. She also have the Diltiazem '30mg'$  on hand told her that the top BP above 100 and heart rate greater than 100 she can take it.

## 2021-09-14 ENCOUNTER — Other Ambulatory Visit: Payer: Self-pay | Admitting: Cardiovascular Disease

## 2021-09-14 DIAGNOSIS — I4819 Other persistent atrial fibrillation: Secondary | ICD-10-CM

## 2021-09-14 NOTE — Telephone Encounter (Signed)
Prescription refill request for Eliquis received. Indication: Afib  Last office visit: 09/03/21 Oval Linsey) Scr: 0.83 (09/01/21) Age: 76 Weight: 75.2kg  Appropriate dose and refill sent to requested pharmacy.

## 2021-09-14 NOTE — Telephone Encounter (Signed)
Please review for refill. Thank you! 

## 2021-09-17 ENCOUNTER — Other Ambulatory Visit: Payer: Self-pay | Admitting: Nurse Practitioner

## 2021-09-21 ENCOUNTER — Other Ambulatory Visit: Payer: Self-pay | Admitting: Cardiology

## 2021-10-01 ENCOUNTER — Telehealth: Payer: Self-pay | Admitting: Cardiology

## 2021-10-01 ENCOUNTER — Encounter: Payer: Self-pay | Admitting: Cardiology

## 2021-10-01 NOTE — Telephone Encounter (Signed)
Pt called in asking to speak to nurse about some medication issues and possible afib. She states this morning her bp was116/77  hr116 and then she took it again and it was 110/61  hr60 , she states her cardia was showing possible afib. Please advise

## 2021-10-01 NOTE — Telephone Encounter (Signed)
Pt reports that she started feeling tachycardic around 7:30 pm last night, HRs avg 120s going up to 140s. Last night VS: 7:40 pm 150/79, HR 99 Took a Diltiazem PRN,  BP 131/67, HR 101 Took another Diltiazem an hour later at 9:40 pm  BP  114/73, HR 90 Took another Diltiazem an hour later, "kinda felt different", 109/52 HR 64 States pulse ox at bedtime last night said HR was 43  States she did speak with the AFib clinic earlier today about this and they told her that she is not taking her PRN Ditlaizem properly/as prescribed.  Pt states she is positive someone told her she could take one every hour up to 4 times,  but cannot remember who. Informed pt that AFib clinic was correct and I would have advised her the same.  Today she has taken PRN Diltiazem this morning and another at 2:50 pm. VS today: 7:45 am 116/77, HR 116 Took Diltiazem 8:55 am 106/53, HR 60 10:00 am 93/51, HR 62 -- light headed 10:35 am 110/61, HR 60 w/ possible afib per her kardia mobile.  Will forward to MD for review/advisement.

## 2021-10-02 ENCOUNTER — Encounter: Payer: Self-pay | Admitting: Cardiology

## 2021-10-02 NOTE — Telephone Encounter (Signed)
See my chart for documentation. Pt advised to increase Diltiazem to 180 mg  daily per Dr. Curt Bears Pt scheduled for OV next week. Patient verbalized understanding and agreeable to plan.

## 2021-10-02 NOTE — Telephone Encounter (Addendum)
Spoke to pt. Informed to increase Diltiazem to 180 mg daily to see if improvement in HRs (pt husband takes this dose at home, she is going to use his and see how she responds.  Aware will not update medication on med list until we see her Tuesday) Advised to call office over w/e if needed. Patient verbalized understanding and agreeable to plan.

## 2021-10-03 ENCOUNTER — Telehealth: Payer: Self-pay | Admitting: Student

## 2021-10-03 NOTE — Telephone Encounter (Signed)
   Patient called Answering Service with concerns of rapid atrial fibrillation. Called spoke with patient.  She has been communicating with our office over the last couple of days regarding similar concern.  Cardizem CD was increased to 180 mg yesterday.  She states she woke up and heart rates were in the 70s but then have gradually been increasing.  She took the first dose of the Cardizem CD 180 mg today around 1:20 PM but heart rates are still elevated.  Currently in the 130s.  Systolic BP in the 977S.  She is wondering if she can take a dose of her short acting Cardizem 30 mg.  She does report palpitations and some shortness of breath with this.  She also has some shoulder/chest heaviness which she states she normally has when she is in rapid atrial fibrillation.  Cath in 09/2018 showed normal coronaries.  She has a follow-up visit with Dr. Curt Bears scheduled for this coming Tuesday.  Advised patient that it is okay to take the short acting Cardizem and she can take this every 6 hours if needed heart rate greater than 100 bpm.  However, I would recommend that she check her blood pressure prior to taking this and avoid if systolic BP less than 142. Advised patient to go to the ED if her shortness of breath or shoulder/chest heaviness worsens or does not resolve with improvement in her heart rate.  She voiced understanding and agreed.  Darreld Mclean, PA-C 10/03/2021 2:41 PM

## 2021-10-06 ENCOUNTER — Ambulatory Visit: Payer: Medicare Other | Attending: Cardiology | Admitting: Cardiology

## 2021-10-06 ENCOUNTER — Encounter: Payer: Self-pay | Admitting: Cardiology

## 2021-10-06 ENCOUNTER — Encounter: Payer: Self-pay | Admitting: *Deleted

## 2021-10-06 VITALS — BP 124/70 | HR 133 | Ht 60.0 in | Wt 164.8 lb

## 2021-10-06 DIAGNOSIS — I484 Atypical atrial flutter: Secondary | ICD-10-CM

## 2021-10-06 DIAGNOSIS — D6869 Other thrombophilia: Secondary | ICD-10-CM | POA: Diagnosis not present

## 2021-10-06 DIAGNOSIS — I4819 Other persistent atrial fibrillation: Secondary | ICD-10-CM | POA: Diagnosis not present

## 2021-10-06 MED ORDER — CARVEDILOL 3.125 MG PO TABS: 3.1250 mg | ORAL_TABLET | Freq: Two times a day (BID) | ORAL | 6 refills | Status: DC

## 2021-10-06 MED ORDER — DILTIAZEM HCL 30 MG PO TABS: 30.0000 mg | ORAL_TABLET | Freq: Four times a day (QID) | ORAL | 3 refills | Status: AC | PRN

## 2021-10-06 MED ORDER — POTASSIUM CHLORIDE ER 10 MEQ PO TBCR: 10.0000 meq | EXTENDED_RELEASE_TABLET | Freq: Every day | ORAL | 3 refills | Status: DC | PRN

## 2021-10-06 NOTE — Progress Notes (Unsigned)
Electrophysiology Office Note   Date:  10/07/2021   ID:  KIELEE CARE, DOB [birth year], MRN [id number]  PCP:  Lawerance Cruel, MD  Cardiologist:  Oval Linsey Primary Electrophysiologist:  Chinelo Benn Meredith Leeds, MD    No chief complaint on file.     History of Present Illness: Mary Farrell is a 76 y.o. female who is being seen today for the evaluation of atrial fibrillation at the request of Lawerance Cruel, MD. Presenting today for electrophysiology evaluation.   She has a history significant for moderate aortic regurgitation, mitral stenosis, atrial fibrillation/flutter, hypertension.  She is status post ablation for atrial fibrillation 03/10/2017 with repeat ablation 06/03/2017.  She is status post surgical MVR, AtriClip, maze November 2020.  She is currently on dofetilide.  She has had multiple atrial arrhythmias in the past.  Today, denies symptoms of palpitations, chest pain, orthopnea, PND, lower extremity edema, claudication, dizziness, presyncope, syncope, bleeding, or neurologic sequela. The patient is tolerating medications without difficulties.  She presents today feeling short of breath and fatigue.  She finds it difficult to do her daily activities.  She went into atrial flutter a few weeks ago.  She states that her heart rates are in the 70s to 80s in the morning, but in the evenings jump up into the 130s.  She feels quite poorly during these episodes.   Past Medical History:  Diagnosis Date   Anxiety 06/29/2012   Aortic regurgitation 08/13/2015   Arthritis    "hands, wrists, back, feet, toes" (06/24/2017)   Atrial flutter ([place]) 09/18/2015   Atypical chest pain 05/13/2016   Chest pain    remote cath in 1996 with NORMAL coronaries noted   CHF (congestive heart failure) ([place])    Dyspnea 10/30/2010   Essential hypertension 09/05/2013   Exogenous obesity    history of    GERD (gastroesophageal reflux disease)    Glaucoma, both eyes    Headache, migraine    "stopped in my  91's" (09/18/2015)   History of cardiovascular stress test 2007   showing no ischemia   Hypertension    Mitral stenosis and aortic insufficiency 06/23/2010   Mitral stenosis with insufficiency    Mitral stenosis with regurgitation    Murmur    of mitral stenosis and moderate aortic insufficiency       Nausea 05/01/2018   Obesity (BMI 30.0-34.9) 02/13/2021   OSA on CPAP    Palpitations    occasional   Paroxysmal A-fib ([place]) 06/24/2017   Pericardial effusion 09/18/2015   Persistent atrial fibrillation ([place]) 03/10/2017   Personal history of rheumatic heart disease    S/P minimally-invasive maze operation for atrial fibrillation 12/13/2018   Complete bilateral atrial lesion set using cryothermy and bipolar radiofrequency ablation with clipping of LA appendage via right mini-thoracotomy approach   S/P minimally-invasive mitral valve replacement with bioprosthetic valve 12/13/2018   31 mm [facility] Mitral stented bovine pericardial tissue valve   SBE (subacute bacterial endocarditis)    prophylaxis, patient unaware   Sliding hiatal hernia    Stress 02/13/2021   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   Vertigo 05/01/2018   Past Surgical History:  Procedure Laterality Date   ABDOMINAL HYSTERECTOMY  1975   APPENDECTOMY  1977   ATRIAL FIBRILLATION ABLATION N/A 03/10/2017   Procedure: ATRIAL FIBRILLATION ABLATION;  Surgeon: [name], MD;  Location: [place] [facility];  Service: Cardiovascular;  Laterality: N/A;   ATRIAL FIBRILLATION ABLATION  06/24/2017   ATRIAL FIBRILLATION ABLATION N/A  06/24/2017   Procedure: ATRIAL FIBRILLATION ABLATION;  Surgeon: [name], MD;  Location: [place] [facility];  Service: Cardiovascular;  Laterality: N/A;   BUNIONECTOMY Right 2000s   CARDIAC CATHETERIZATION  01/13/1995   normal coronary anatomy and mild mitral stenosis and mild pulmonary hypertension   CARDIOVERSION N/A 09/22/2015   Procedure: CARDIOVERSION;  Surgeon: Jerline Pain, MD;  Location: [place];  Service: Cardiovascular;  Laterality: N/A;   CARDIOVERSION N/A 10/25/2016   Procedure: CARDIOVERSION;  Surgeon: Fay Records, MD;  Location: [place];  Service: Cardiovascular;  Laterality: N/A;   CARDIOVERSION N/A 04/15/2017   Procedure: CARDIOVERSION;  Surgeon: Josue Hector, MD;  Location: [facility] ENDOSCOPY;  Service: Cardiovascular;  Laterality: N/A;   CARDIOVERSION N/A 05/16/2017   Procedure: CARDIOVERSION;  Surgeon: Pixie Casino, MD;  Location: [facility] ENDOSCOPY;  Service: Cardiovascular;  Laterality: N/A;   CARDIOVERSION N/A 01/24/2019   Procedure: CARDIOVERSION;  Surgeon: Pixie Casino, MD;  Location: [facility] ENDOSCOPY;  Service: Cardiovascular;  Laterality: N/A;   CATARACT EXTRACTION W/ INTRAOCULAR LENS  IMPLANT, BILATERAL Bilateral 2013   COLONOSCOPY  2013   EYE SURGERY Bilateral    cataracts   LAPAROSCOPIC CHOLECYSTECTOMY  1997   MINIMALLY INVASIVE MAZE PROCEDURE N/A 12/13/2018   Procedure: MINIMALLY INVASIVE MAZE PROCEDURE using a 45 MM AtriClip.;  Surgeon: Rexene Alberts, MD;  Location: [place];  Service: Open Heart Surgery;  Laterality: N/A;   MITRAL VALVE REPLACEMENT Right 12/13/2018   Procedure: MINIMALLY INVASIVE MITRAL VALVE (MV) REPLACEMENT using a Magna Mitral Ease 31 MM Valve.;  Surgeon: Rexene Alberts, MD;  Location: [place];  Service: Open Heart Surgery;  Laterality: Right;   RIGHT/LEFT HEART CATH AND CORONARY ANGIOGRAPHY N/A 10/18/2018   Procedure: RIGHT/LEFT HEART CATH AND CORONARY ANGIOGRAPHY;  Surgeon: Leonie Man, MD;  Location: [place] [facility];  Service: Cardiovascular;  Laterality: N/A;   TEE WITHOUT CARDIOVERSION N/A 06/23/2017   Procedure: TRANSESOPHAGEAL ECHOCARDIOGRAM (TEE);  Surgeon: Fay Records, MD;  Location: [place];  Service: Cardiovascular;  Laterality: N/A;   TEE WITHOUT CARDIOVERSION N/A 10/17/2018   Procedure: TRANSESOPHAGEAL ECHOCARDIOGRAM (TEE);  Surgeon: Jerline Pain, MD;  Location: [facility] ENDOSCOPY;   Service: Cardiovascular;  Laterality: N/A;   TEE WITHOUT CARDIOVERSION N/A 12/13/2018   Procedure: TRANSESOPHAGEAL ECHOCARDIOGRAM (TEE);  Surgeon: Rexene Alberts, MD;  Location: [place];  Service: Open Heart Surgery;  Laterality: N/A;   TONSILLECTOMY AND ADENOIDECTOMY  1990s   UVULOPALATOPHARYNGOPLASTY, TONSILLECTOMY AND SEPTOPLASTY  1990s     Current Outpatient Medications  Medication Sig Dispense Refill   acetaminophen (TYLENOL) 500 MG tablet Take 1,000 mg by mouth every 6 (six) hours as needed for moderate pain or headache.      ALPRAZolam (XANAX) 0.25 MG tablet Take 0.125 mg by mouth 2 (two) times daily as needed for anxiety.      Calcium Citrate-Vitamin D (CALCIUM CITRATE + D3) 200-250 MG-UNIT TABS Take 1 tablet by mouth daily with supper.     cetirizine (ZYRTEC) 10 MG tablet Take 10 mg by mouth at bedtime.      Cholecalciferol (VITAMIN D) 2000 UNITS CAPS Take 2,000 Units by mouth daily.     conjugated estrogens (PREMARIN) vaginal cream Place 1 Applicatorful vaginally daily as needed (irritation).      Cyanocobalamin (VITAMIN B12) 1000 MCG TBCR Take 1,000 mcg by mouth daily.     diltiazem (CARDIZEM CD) 120 MG 24 hr capsule TAKE 1 CAPSULE (120 MG TOTAL) BY MOUTH DAILY WITH LUNCH.  90 capsule 3   dofetilide (TIKOSYN) 500 MCG capsule Take 1 capsule (500 mcg total) by mouth 2 (two) times daily. 180 capsule 3   dorzolamide-timolol (COSOPT) 22.3-6.8 MG/ML ophthalmic solution Place 1 drop into both eyes daily.     ELIQUIS 5 MG TABS tablet TAKE 1 TABLET BY MOUTH TWICE  DAILY 180 tablet 3   famotidine (PEPCID) 20 MG tablet Take 20 mg by mouth at bedtime.     fluticasone (FLONASE) 50 MCG/ACT nasal spray Place 1 spray into both nostrils daily as needed for allergies.     furosemide (LASIX) 20 MG tablet Take 1 tablet (20 mg total) by mouth daily as needed for fluid. For >3lb/overnight or >5lb/weekly note dose 30 tablet 4   gabapentin (NEURONTIN) 300 MG capsule Take 300 mg by mouth 3 (three) times  daily.      hydrALAZINE (APRESOLINE) 25 MG tablet TAKE 1 TABLET DAILY AS NEEDED FOR BLOOD PRESSURE ABOVE 140 30 tablet 5   latanoprost (XALATAN) 0.005 % ophthalmic solution Place 1 drop into both eyes at bedtime.   6   magnesium oxide (MAG-OX) 400 (240 Mg) MG tablet TAKE 0.5 TABLETS (200 MG TOTAL) BY MOUTH DAILY. 45 tablet 2   meclizine (ANTIVERT) 25 MG tablet Take 1 tablet by mouth as needed.     Multiple Vitamin (MULTIVITAMIN WITH MINERALS) TABS tablet Take 1 tablet by mouth daily.     NON FORMULARY Apply 1 application topically 2 (two) times daily as needed (for eczema). Traimcinolone/[employer] Moist Cream     oxybutynin (DITROPAN-XL) 10 MG 24 hr tablet Take 10 mg by mouth daily.     pantoprazole (PROTONIX) 40 MG tablet Take 1 tablet by mouth daily.     polyethylene glycol (MIRALAX / GLYCOLAX) 17 g packet Take 17 g by mouth daily as needed for moderate constipation.      potassium chloride (KLOR-CON) 10 MEQ tablet Take 1 tablet (10 mEq total) by mouth daily as needed (when you take Lasix). 30 tablet 3   PRESCRIPTION MEDICATION Inhale into the lungs at bedtime. CPAP     valsartan (DIOVAN) 160 MG tablet Take 1 tablet (160 mg total) by mouth daily. 90 tablet 3   atorvastatin (LIPITOR) 10 MG tablet Take 10 mg by mouth daily.     carvedilol (COREG) 3.125 MG tablet Take 1 tablet (3.125 mg total) by mouth 2 (two) times daily with a meal. 60 tablet 6   diltiazem (CARDIZEM) 30 MG tablet Take 1 tablet (30 mg total) by mouth 4 (four) times daily as needed (for heart rates greater than 100). 60 tablet 3   No current facility-administered medications for this visit.    Allergies:   Tizanidine, Amoxicillin, Metoprolol tartrate, and Oxaprozin   Social History:  The patient  reports that she has never smoked. She has never used smokeless tobacco. She reports current alcohol use. She reports that she does not use drugs.   Family History:  The patient's family history includes Diabetes in her brother; Heart  attack in her mother; Heart failure in her maternal grandfather; Hypertension in her brother; Kidney disease in her brother.   ROS:  Please see the history of present illness.   Otherwise, review of systems is positive for none.   All other systems are reviewed and negative.   PHYSICAL EXAM: VS:  BP 124/70   Pulse (!) 133   Ht 5' (1.524 m)   Wt 164 lb 12.8 oz (74.8 kg)   SpO2 97%   BMI  32.19 kg/m  , BMI Body mass index is 32.19 kg/m. GEN: Well nourished, well developed, in no acute distress  HEENT: normal  Neck: no JVD, carotid bruits, or masses Cardiac: Tachycardic, regular; no murmurs, rubs, or gallops,no edema  Respiratory:  clear to auscultation bilaterally, normal work of breathing GI: soft, nontender, nondistended, + BS MS: no deformity or atrophy  Skin: warm and dry Neuro:  Strength and sensation are intact Psych: euthymic mood, full affect  EKG:  EKG is ordered today. Personal review of the ekg ordered shows atrial flutter, rate 133  Recent Labs: 03/30/2021: Platelets 191 04/03/2021: Hemoglobin 10.0 09/01/2021: BUN 13; Creatinine, Ser 0.83; Magnesium 2.3; Potassium 4.2; Sodium 138    Lipid Panel  No results found for: "CHOL", "TRIG", "HDL", "CHOLHDL", "VLDL", "LDLCALC", "LDLDIRECT"   Wt Readings from Last 3 Encounters:  10/06/21 164 lb 12.8 oz (74.8 kg)  09/03/21 165 lb 12.8 oz (75.2 kg)  09/01/21 162 lb 12.8 oz (73.8 kg)      Other studies Reviewed: Additional studies/ records that were reviewed today include: TTE 01/23/19  Review of the above records today demonstrates:   1. Left ventricular ejection fraction, by visual estimation, is 55 to  60%. The left ventricle has normal function. Left ventricular septal wall  thickness was mildly increased. Moderately increased left ventricular  posterior wall thickness. There is  mildly increased left ventricular hypertrophy.   2. Left ventricular diastolic parameters are indeterminate.   3. The left ventricle  demonstrates regional wall motion abnormalities.   4. Elevated LVEDP. Hypokinesis of the basal septum consistent with  post-operative state.   5. Global right ventricle has normal systolic function.The right  ventricular size is normal. No increase in right ventricular wall  thickness.   6. Left atrial size was severely dilated.   7. Right atrial size was normal.   8. The mitral valve has been repaired/replaced. No evidence of mitral  valve regurgitation. No evidence of mitral stenosis.   9. The tricuspid valve is normal in structure.  10. The aortic valve is tricuspid. Aortic valve regurgitation is mild. No  evidence of aortic valve sclerosis or stenosis.  11. The pulmonic valve was normal in structure. Pulmonic valve  regurgitation is not visualized.  12. Normal pulmonary artery systolic pressure.  13. The inferior vena cava is normal in size with <50% respiratory  variability, suggesting right atrial pressure of 8 mmHg.    ASSESSMENT AND PLAN:  1.  Persistent atrial fibrillation: Status post ablation 03/10/2017 with repeat ablation 06/24/2017.  CHA2DS2-VASc of 3.  Currently on Coumadin, dofetilide 500 mcg twice daily.  High risk medication monitoring for dofetilide via ECG and labs.  She unfortunately has gone back into atrial flutter feeling quite poorly.  Due to her symptoms, we Walter Min plan for cardioversion.  I do think that she would benefit from a repeat ablation attempt.  Due to her surgical maze, we Alexiana Laverdure check to the ablation lines done for that and likely ablate CTI.  Risk, benefits, and alternatives to EP study and radiofrequency ablation for afib were also discussed in detail today. These risks include but are not limited to stroke, bleeding, vascular damage, tamponade, perforation, damage to the esophagus, lungs, and other structures, pulmonary vein stenosis, worsening renal function, and death. The patient understands these risk and wishes to proceed.  We Ottilie Wigglesworth therefore proceed  with catheter ablation at the next available time.  Carto, ICE, anesthesia are requested for the procedure.  Aliea Bobe also obtain CT PV protocol  prior to the procedure to exclude LAA thrombus and further evaluate atrial anatomy.   2.  Pericardial effusion: Now postop.  Has never had tamponade.  No changes.  3.  Hypertension: Currently well controlled  4.  Mitral stenosis: Status post MVR with bioprosthetic mitral valve and maze.  Stable on most recent echo.  No changes.  5.  Secondary hypercoagulable state: Currently on Eliquis for atrial fibrillation as above Current medicines are reviewed at length with the patient today.   The patient does not have concerns regarding her medicines.  The following changes were made today: None  Labs/ tests ordered today include:  No orders of the defined types were placed in this encounter.     Disposition:   FU 3 months  Signed, Gottlieb Zuercher Meredith Leeds, MD  10/07/2021 7:29 AM     [address] [address] [phone] (office) [phone] (fax)

## 2021-10-06 NOTE — Patient Instructions (Addendum)
Medication Instructions:  Your physician recommends that you continue on your current medications as directed. Please refer to the Current Medication list given to you today.  *If you need a refill on your cardiac medications before your next appointment, please call your pharmacy*   Lab Work: Pre procedure labs -- see procedure instruction letter:  BMP & CBC  If you have labs (blood work) drawn today and your tests are completely normal, you will receive your results only by: [place] (if you have MyChart) OR A paper copy in the mail If you have any lab test that is abnormal or we need to change your treatment, we will call you to review the results.   Testing/Procedures: Your physician has recommended that you have a Cardioversion (DCCV). Electrical Cardioversion uses a jolt of electricity to your heart either through paddles or wired patches attached to your chest. This is a controlled, usually prescheduled, procedure. Defibrillation is done under light anesthesia in the hospital, and you usually go home the day of the procedure. This is done to get your heart back into a normal rhythm. You are not awake for the procedure. Please see the instruction sheet given to you today.  Your physician has requested that you have cardiac CT within 7 days PRIOR to your ablation. Cardiac computed tomography (CT) is a painless test that uses an x-ray machine to take clear, detailed pictures of your heart.  Please follow instruction below located under "other instructions". You will get a call from our office to schedule the date for this test.  Your physician has recommended that you have an ablation. Catheter ablation is a medical procedure used to treat some cardiac arrhythmias (irregular heartbeats). During catheter ablation, a long, thin, flexible tube is put into a blood vessel in your groin (upper thigh), or neck. This tube is called an ablation catheter. It is then guided to your heart through  the blood vessel. Radio frequency waves destroy small areas of heart tissue where abnormal heartbeats may cause an arrhythmia to start. Please follow instruction letter given to you today.   Follow-Up: At [facility], you and your health needs are our priority.  As part of our continuing mission to provide you with exceptional heart care, we have created designated Provider Care Teams.  These Care Teams include your primary Cardiologist (physician) and Advanced Practice Providers (APPs -  Physician Assistants and Nurse Practitioners) who all work together to provide you with the care you need, when you need it.  Your physician recommends that you schedule a follow-up appointment in: the AFib clinic between 01/26/22 - 02/12/2022  Your next appointment:   1 month(s) after your ablation  The format for your next appointment:   In Person  Provider:   AFib clinic   Thank you for choosing [facility]!!   Trinidad Curet, RN [phone]    Other Instructions   Cardiac Ablation Cardiac ablation is a procedure to destroy (ablate) some heart tissue that is sending bad signals. These bad signals cause problems in heart rhythm. The heart has many areas that make these signals. If there are problems in these areas, they can make the heart beat in a way that is not normal. Destroying some tissues can help make the heart rhythm normal. Tell your doctor about: Any allergies you have. All medicines you are taking. These include vitamins, herbs, eye drops, creams, and over-the-counter medicines. Any problems you or family members have had with medicines that make you fall  asleep (anesthetics). Any blood disorders you have. Any surgeries you have had. Any medical conditions you have, such as kidney failure. Whether you are pregnant or may be pregnant. What are the risks? This is a safe procedure. But problems may occur, including: Infection. Bruising and bleeding. Bleeding into the  chest. Stroke or blood clots. Damage to nearby areas of your body. Allergies to medicines or dyes. The need for a pacemaker if the normal system is damaged. Failure of the procedure to treat the problem. What happens before the procedure? Medicines Ask your doctor about: Changing or stopping your normal medicines. This is important. Taking aspirin and ibuprofen. Do not take these medicines unless your doctor tells you to take them. Taking other medicines, vitamins, herbs, and supplements. General instructions Follow instructions from your doctor about what you cannot eat or drink. Plan to have someone take you home from the hospital or clinic. If you will be going home right after the procedure, plan to have someone with you for 24 hours. Ask your doctor what steps will be taken to prevent infection. What happens during the procedure?  An IV tube will be put into one of your veins. You will be given a medicine to help you relax. The skin on your neck or groin will be numbed. A cut (incision) will be made in your neck or groin. A needle will be put through your cut and into a large vein. A tube (catheter) will be put into the needle. The tube will be moved to your heart. Dye may be put through the tube. This helps your doctor see your heart. Small devices (electrodes) on the tube will send out signals. A type of energy will be used to destroy some heart tissue. The tube will be taken out. Pressure will be held on your cut. This helps stop bleeding. A bandage will be put over your cut. The exact procedure may vary among doctors and hospitals. What happens after the procedure? You will be watched until you leave the hospital or clinic. This includes checking your heart rate, breathing rate, oxygen, and blood pressure. Your cut will be watched for bleeding. You will need to lie still for a few hours. Do not drive for 24 hours or as long as your doctor tells you. Summary Cardiac  ablation is a procedure to destroy some heart tissue. This is done to treat heart rhythm problems. Tell your doctor about any medical conditions you may have. Tell him or her about all medicines you are taking to treat them. This is a safe procedure. But problems may occur. These include infection, bruising, bleeding, and damage to nearby areas of your body. Follow what your doctor tells you about food and drink. You may also be told to change or stop some of your medicines. After the procedure, do not drive for 24 hours or as long as your doctor tells you. This information is not intended to replace advice given to you by your health care provider. Make sure you discuss any questions you have with your health care provider. Document Revised: 04/03/2021 Document Reviewed: 12/14/2018 Elsevier Patient Education  [place].

## 2021-10-06 NOTE — H&P (View-Only) (Signed)
Electrophysiology Office Note   Date:  10/07/2021   ID:  Mary Farrell, DOB 31-Jul-1945, MRN [id number]  PCP:  Lawerance Cruel, MD  Cardiologist:  Oval Linsey Primary Electrophysiologist:  Dudley Mages Meredith Leeds, MD    No chief complaint on file.     History of Present Illness: Mary Farrell is a 76 y.o. female who is being seen today for the evaluation of atrial fibrillation at the request of Lawerance Cruel, MD. Presenting today for electrophysiology evaluation.   She has a history significant for moderate aortic regurgitation, mitral stenosis, atrial fibrillation/flutter, hypertension.  She is status post ablation for atrial fibrillation 03/10/2017 with repeat ablation 06/03/2017.  She is status post surgical MVR, AtriClip, maze November 2020.  She is currently on dofetilide.  She has had multiple atrial arrhythmias in the past.  Today, denies symptoms of palpitations, chest pain, orthopnea, PND, lower extremity edema, claudication, dizziness, presyncope, syncope, bleeding, or neurologic sequela. The patient is tolerating medications without difficulties.  She presents today feeling short of breath and fatigue.  She finds it difficult to do her daily activities.  She went into atrial flutter a few weeks ago.  She states that her heart rates are in the 70s to 80s in the morning, but in the evenings jump up into the 130s.  She feels quite poorly during these episodes.   Past Medical History:  Diagnosis Date   Anxiety 06/29/2012   Aortic regurgitation 08/13/2015   Arthritis    "hands, wrists, back, feet, toes" (06/24/2017)   Atrial flutter ([place]) 09/18/2015   Atypical chest pain 05/13/2016   Chest pain    remote cath in 1996 with NORMAL coronaries noted   CHF (congestive heart failure) ([place])    Dyspnea 10/30/2010   Essential hypertension 09/05/2013   Exogenous obesity    history of    GERD (gastroesophageal reflux disease)    Glaucoma, both eyes    Headache, migraine    "stopped in my  57's" (09/18/2015)   History of cardiovascular stress test 2007   showing no ischemia   Hypertension    Mitral stenosis and aortic insufficiency 06/23/2010   Mitral stenosis with insufficiency    Mitral stenosis with regurgitation    Murmur    of mitral stenosis and moderate aortic insufficiency       Nausea 05/01/2018   Obesity (BMI 30.0-34.9) 02/13/2021   OSA on CPAP    Palpitations    occasional   Paroxysmal A-fib ([place]) 06/24/2017   Pericardial effusion 09/18/2015   Persistent atrial fibrillation ([place]) 03/10/2017   Personal history of rheumatic heart disease    S/P minimally-invasive maze operation for atrial fibrillation 12/13/2018   Complete bilateral atrial lesion set using cryothermy and bipolar radiofrequency ablation with clipping of LA appendage via right mini-thoracotomy approach   S/P minimally-invasive mitral valve replacement with bioprosthetic valve 12/13/2018   31 mm [facility] Mitral stented bovine pericardial tissue valve   SBE (subacute bacterial endocarditis)    prophylaxis, patient unaware   Sliding hiatal hernia    Stress 02/13/2021   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   Vertigo 05/01/2018   Past Surgical History:  Procedure Laterality Date   ABDOMINAL HYSTERECTOMY  1975   APPENDECTOMY  1977   ATRIAL FIBRILLATION ABLATION N/A 03/10/2017   Procedure: ATRIAL FIBRILLATION ABLATION;  Surgeon: [name], MD;  Location: [place] CV [facility];  Service: Cardiovascular;  Laterality: N/A;   ATRIAL FIBRILLATION ABLATION  06/24/2017   ATRIAL FIBRILLATION ABLATION N/A  06/24/2017   Procedure: ATRIAL FIBRILLATION ABLATION;  Surgeon: [name], MD;  Location: [place] [facility];  Service: Cardiovascular;  Laterality: N/A;   BUNIONECTOMY Right 2000s   CARDIAC CATHETERIZATION  01/13/1995   normal coronary anatomy and mild mitral stenosis and mild pulmonary hypertension   CARDIOVERSION N/A 09/22/2015   Procedure: CARDIOVERSION;  Surgeon: Jerline Pain, MD;  Location: [place];  Service: Cardiovascular;  Laterality: N/A;   CARDIOVERSION N/A 10/25/2016   Procedure: CARDIOVERSION;  Surgeon: Fay Records, MD;  Location: [place];  Service: Cardiovascular;  Laterality: N/A;   CARDIOVERSION N/A 04/15/2017   Procedure: CARDIOVERSION;  Surgeon: Josue Hector, MD;  Location: [facility] ENDOSCOPY;  Service: Cardiovascular;  Laterality: N/A;   CARDIOVERSION N/A 05/16/2017   Procedure: CARDIOVERSION;  Surgeon: Pixie Casino, MD;  Location: [facility] ENDOSCOPY;  Service: Cardiovascular;  Laterality: N/A;   CARDIOVERSION N/A 01/24/2019   Procedure: CARDIOVERSION;  Surgeon: Pixie Casino, MD;  Location: [facility] ENDOSCOPY;  Service: Cardiovascular;  Laterality: N/A;   CATARACT EXTRACTION W/ INTRAOCULAR LENS  IMPLANT, BILATERAL Bilateral 2013   COLONOSCOPY  2013   EYE SURGERY Bilateral    cataracts   LAPAROSCOPIC CHOLECYSTECTOMY  1997   MINIMALLY INVASIVE MAZE PROCEDURE N/A 12/13/2018   Procedure: MINIMALLY INVASIVE MAZE PROCEDURE using a 45 MM AtriClip.;  Surgeon: Rexene Alberts, MD;  Location: [place];  Service: Open Heart Surgery;  Laterality: N/A;   MITRAL VALVE REPLACEMENT Right 12/13/2018   Procedure: MINIMALLY INVASIVE MITRAL VALVE (MV) REPLACEMENT using a Magna Mitral Ease 31 MM Valve.;  Surgeon: Rexene Alberts, MD;  Location: [place];  Service: Open Heart Surgery;  Laterality: Right;   RIGHT/LEFT HEART CATH AND CORONARY ANGIOGRAPHY N/A 10/18/2018   Procedure: RIGHT/LEFT HEART CATH AND CORONARY ANGIOGRAPHY;  Surgeon: Leonie Man, MD;  Location: [place] CV LAB;  Service: Cardiovascular;  Laterality: N/A;   TEE WITHOUT CARDIOVERSION N/A 06/23/2017   Procedure: TRANSESOPHAGEAL ECHOCARDIOGRAM (TEE);  Surgeon: Fay Records, MD;  Location: [place];  Service: Cardiovascular;  Laterality: N/A;   TEE WITHOUT CARDIOVERSION N/A 10/17/2018   Procedure: TRANSESOPHAGEAL ECHOCARDIOGRAM (TEE);  Surgeon: Jerline Pain, MD;  Location: [facility] ENDOSCOPY;   Service: Cardiovascular;  Laterality: N/A;   TEE WITHOUT CARDIOVERSION N/A 12/13/2018   Procedure: TRANSESOPHAGEAL ECHOCARDIOGRAM (TEE);  Surgeon: Rexene Alberts, MD;  Location: [place];  Service: Open Heart Surgery;  Laterality: N/A;   TONSILLECTOMY AND ADENOIDECTOMY  1990s   UVULOPALATOPHARYNGOPLASTY, TONSILLECTOMY AND SEPTOPLASTY  1990s     Current Outpatient Medications  Medication Sig Dispense Refill   acetaminophen (TYLENOL) 500 MG tablet Take 1,000 mg by mouth every 6 (six) hours as needed for moderate pain or headache.      ALPRAZolam (XANAX) 0.25 MG tablet Take 0.125 mg by mouth 2 (two) times daily as needed for anxiety.      Calcium Citrate-Vitamin D (CALCIUM CITRATE + D3) 200-250 MG-UNIT TABS Take 1 tablet by mouth daily with supper.     cetirizine (ZYRTEC) 10 MG tablet Take 10 mg by mouth at bedtime.      Cholecalciferol (VITAMIN D) 2000 UNITS CAPS Take 2,000 Units by mouth daily.     conjugated estrogens (PREMARIN) vaginal cream Place 1 Applicatorful vaginally daily as needed (irritation).      Cyanocobalamin (VITAMIN B12) 1000 MCG TBCR Take 1,000 mcg by mouth daily.     diltiazem (CARDIZEM CD) 120 MG 24 hr capsule TAKE 1 CAPSULE (120 MG TOTAL) BY MOUTH DAILY WITH LUNCH.  90 capsule 3   dofetilide (TIKOSYN) 500 MCG capsule Take 1 capsule (500 mcg total) by mouth 2 (two) times daily. 180 capsule 3   dorzolamide-timolol (COSOPT) 22.3-6.8 MG/ML ophthalmic solution Place 1 drop into both eyes daily.     ELIQUIS 5 MG TABS tablet TAKE 1 TABLET BY MOUTH TWICE  DAILY 180 tablet 3   famotidine (PEPCID) 20 MG tablet Take 20 mg by mouth at bedtime.     fluticasone (FLONASE) 50 MCG/ACT nasal spray Place 1 spray into both nostrils daily as needed for allergies.     furosemide (LASIX) 20 MG tablet Take 1 tablet (20 mg total) by mouth daily as needed for fluid. For >3lb/overnight or >5lb/weekly note dose 30 tablet 4   gabapentin (NEURONTIN) 300 MG capsule Take 300 mg by mouth 3 (three) times  daily.      hydrALAZINE (APRESOLINE) 25 MG tablet TAKE 1 TABLET DAILY AS NEEDED FOR BLOOD PRESSURE ABOVE 140 30 tablet 5   latanoprost (XALATAN) 0.005 % ophthalmic solution Place 1 drop into both eyes at bedtime.   6   magnesium oxide (MAG-OX) 400 (240 Mg) MG tablet TAKE 0.5 TABLETS (200 MG TOTAL) BY MOUTH DAILY. 45 tablet 2   meclizine (ANTIVERT) 25 MG tablet Take 1 tablet by mouth as needed.     Multiple Vitamin (MULTIVITAMIN WITH MINERALS) TABS tablet Take 1 tablet by mouth daily.     NON FORMULARY Apply 1 application topically 2 (two) times daily as needed (for eczema). Traimcinolone/[employer] Moist Cream     oxybutynin (DITROPAN-XL) 10 MG 24 hr tablet Take 10 mg by mouth daily.     pantoprazole (PROTONIX) 40 MG tablet Take 1 tablet by mouth daily.     polyethylene glycol (MIRALAX / GLYCOLAX) 17 g packet Take 17 g by mouth daily as needed for moderate constipation.      potassium chloride (KLOR-CON) 10 MEQ tablet Take 1 tablet (10 mEq total) by mouth daily as needed (when you take Lasix). 30 tablet 3   PRESCRIPTION MEDICATION Inhale into the lungs at bedtime. CPAP     valsartan (DIOVAN) 160 MG tablet Take 1 tablet (160 mg total) by mouth daily. 90 tablet 3   atorvastatin (LIPITOR) 10 MG tablet Take 10 mg by mouth daily.     carvedilol (COREG) 3.125 MG tablet Take 1 tablet (3.125 mg total) by mouth 2 (two) times daily with a meal. 60 tablet 6   diltiazem (CARDIZEM) 30 MG tablet Take 1 tablet (30 mg total) by mouth 4 (four) times daily as needed (for heart rates greater than 100). 60 tablet 3   No current facility-administered medications for this visit.    Allergies:   Tizanidine, Amoxicillin, Metoprolol tartrate, and Oxaprozin   Social History:  The patient  reports that she has never smoked. She has never used smokeless tobacco. She reports current alcohol use. She reports that she does not use drugs.   Family History:  The patient's family history includes Diabetes in her brother; Heart  attack in her mother; Heart failure in her maternal grandfather; Hypertension in her brother; Kidney disease in her brother.   ROS:  Please see the history of present illness.   Otherwise, review of systems is positive for none.   All other systems are reviewed and negative.   PHYSICAL EXAM: VS:  BP 124/70   Pulse (!) 133   Ht 5' (1.524 m)   Wt 164 lb 12.8 oz (74.8 kg)   SpO2 97%   BMI  32.19 kg/m  , BMI Body mass index is 32.19 kg/m. GEN: Well nourished, well developed, in no acute distress  HEENT: normal  Neck: no JVD, carotid bruits, or masses Cardiac: Tachycardic, regular; no murmurs, rubs, or gallops,no edema  Respiratory:  clear to auscultation bilaterally, normal work of breathing GI: soft, nontender, nondistended, + BS MS: no deformity or atrophy  Skin: warm and dry Neuro:  Strength and sensation are intact Psych: euthymic mood, full affect  EKG:  EKG is ordered today. Personal review of the ekg ordered shows atrial flutter, rate 133  Recent Labs: 03/30/2021: Platelets 191 04/03/2021: Hemoglobin 10.0 09/01/2021: BUN 13; Creatinine, Ser 0.83; Magnesium 2.3; Potassium 4.2; Sodium 138    Lipid Panel  No results found for: "CHOL", "TRIG", "HDL", "CHOLHDL", "VLDL", "LDLCALC", "LDLDIRECT"   Wt Readings from Last 3 Encounters:  10/06/21 164 lb 12.8 oz (74.8 kg)  09/03/21 165 lb 12.8 oz (75.2 kg)  09/01/21 162 lb 12.8 oz (73.8 kg)      Other studies Reviewed: Additional studies/ records that were reviewed today include: TTE 01/23/19  Review of the above records today demonstrates:   1. Left ventricular ejection fraction, by visual estimation, is 55 to  60%. The left ventricle has normal function. Left ventricular septal wall  thickness was mildly increased. Moderately increased left ventricular  posterior wall thickness. There is  mildly increased left ventricular hypertrophy.   2. Left ventricular diastolic parameters are indeterminate.   3. The left ventricle  demonstrates regional wall motion abnormalities.   4. Elevated LVEDP. Hypokinesis of the basal septum consistent with  post-operative state.   5. Global right ventricle has normal systolic function.The right  ventricular size is normal. No increase in right ventricular wall  thickness.   6. Left atrial size was severely dilated.   7. Right atrial size was normal.   8. The mitral valve has been repaired/replaced. No evidence of mitral  valve regurgitation. No evidence of mitral stenosis.   9. The tricuspid valve is normal in structure.  10. The aortic valve is tricuspid. Aortic valve regurgitation is mild. No  evidence of aortic valve sclerosis or stenosis.  11. The pulmonic valve was normal in structure. Pulmonic valve  regurgitation is not visualized.  12. Normal pulmonary artery systolic pressure.  13. The inferior vena cava is normal in size with <50% respiratory  variability, suggesting right atrial pressure of 8 mmHg.    ASSESSMENT AND PLAN:  1.  Persistent atrial fibrillation: Status post ablation 03/10/2017 with repeat ablation 06/24/2017.  CHA2DS2-VASc of 3.  Currently on Coumadin, dofetilide 500 mcg twice daily.  High risk medication monitoring for dofetilide via ECG and labs.  She unfortunately has gone back into atrial flutter feeling quite poorly.  Due to her symptoms, we Dorcus Riga plan for cardioversion.  I do think that she would benefit from a repeat ablation attempt.  Due to her surgical maze, we Hilja Kintzel check to the ablation lines done for that and likely ablate CTI.  Risk, benefits, and alternatives to EP study and radiofrequency ablation for afib were also discussed in detail today. These risks include but are not limited to stroke, bleeding, vascular damage, tamponade, perforation, damage to the esophagus, lungs, and other structures, pulmonary vein stenosis, worsening renal function, and death. The patient understands these risk and wishes to proceed.  We Martavious Hartel therefore proceed  with catheter ablation at the next available time.  Carto, ICE, anesthesia are requested for the procedure.  Zeyad Delaguila also obtain CT PV protocol  prior to the procedure to exclude LAA thrombus and further evaluate atrial anatomy.   2.  Pericardial effusion: Now postop.  Has never had tamponade.  No changes.  3.  Hypertension: Currently well controlled  4.  Mitral stenosis: Status post MVR with bioprosthetic mitral valve and maze.  Stable on most recent echo.  No changes.  5.  Secondary hypercoagulable state: Currently on Eliquis for atrial fibrillation as above Current medicines are reviewed at length with the patient today.   The patient does not have concerns regarding her medicines.  The following changes were made today: None  Labs/ tests ordered today include:  No orders of the defined types were placed in this encounter.     Disposition:   FU 3 months  Signed, Jaymen Fetch Meredith Leeds, MD  10/07/2021 7:29 AM     [address] [place]  [phone] (office) [phone] (fax)

## 2021-10-07 NOTE — Addendum Note (Signed)
Addended by: Michelle Nasuti on: 10/07/2021 10:46 AM   Modules accepted: Orders

## 2021-10-09 ENCOUNTER — Encounter: Payer: Self-pay | Admitting: Cardiology

## 2021-10-09 ENCOUNTER — Telehealth (HOSPITAL_COMMUNITY): Payer: Self-pay

## 2021-10-09 NOTE — Telephone Encounter (Signed)
Patient called because she is concerned about her A-fib. She is feeling light headed, HR 108, and B/P 144/76. She has taken 4 doses of her diltiazem 30 mg, Last dose taken at 11:30am. Consulted with Tilda Franco it is okay for her to take another Diltiazem 30 mg. She was told to make sure she takes 1 tablet every 4 hours for her A-fib and not any sooner. Patient aware to seek the ER if she continues to get worse or feels the need to evaluated. She is scheduled for cardioversion on 9/25. Patient was told to contact the A-fib clinic back on Monday if she would like to schedule appointment to be seen prior to her scheduled cardioversion. Consulted with patient and she verbalized understanding.

## 2021-10-14 ENCOUNTER — Encounter (HOSPITAL_COMMUNITY): Payer: Self-pay | Admitting: Cardiology

## 2021-10-14 ENCOUNTER — Ambulatory Visit (HOSPITAL_COMMUNITY): Payer: Medicare Other | Admitting: Anesthesiology

## 2021-10-14 ENCOUNTER — Ambulatory Visit (HOSPITAL_COMMUNITY)
Admission: RE | Admit: 2021-10-14 | Discharge: 2021-10-14 | Disposition: A | Discharge status: Home / Self Care | Payer: Medicare Other | Attending: Cardiology | Admitting: Cardiology

## 2021-10-14 ENCOUNTER — Other Ambulatory Visit: Payer: Self-pay

## 2021-10-14 ENCOUNTER — Ambulatory Visit (HOSPITAL_BASED_OUTPATIENT_CLINIC_OR_DEPARTMENT_OTHER): Payer: Medicare Other | Admitting: Anesthesiology

## 2021-10-14 ENCOUNTER — Encounter (HOSPITAL_COMMUNITY)
Admission: RE | Disposition: A | Discharge status: Home / Self Care | Payer: Self-pay | Source: Home / Self Care | Attending: Cardiology

## 2021-10-14 DIAGNOSIS — I509 Heart failure, unspecified: Secondary | ICD-10-CM | POA: Diagnosis not present

## 2021-10-14 DIAGNOSIS — E669 Obesity, unspecified: Secondary | ICD-10-CM | POA: Insufficient documentation

## 2021-10-14 DIAGNOSIS — I110 Hypertensive heart disease with heart failure: Secondary | ICD-10-CM | POA: Insufficient documentation

## 2021-10-14 DIAGNOSIS — G4733 Obstructive sleep apnea (adult) (pediatric): Secondary | ICD-10-CM | POA: Diagnosis not present

## 2021-10-14 DIAGNOSIS — I4892 Unspecified atrial flutter: Secondary | ICD-10-CM | POA: Diagnosis not present

## 2021-10-14 DIAGNOSIS — K219 Gastro-esophageal reflux disease without esophagitis: Secondary | ICD-10-CM | POA: Diagnosis not present

## 2021-10-14 DIAGNOSIS — Z7901 Long term (current) use of anticoagulants: Secondary | ICD-10-CM | POA: Insufficient documentation

## 2021-10-14 DIAGNOSIS — I351 Nonrheumatic aortic (valve) insufficiency: Secondary | ICD-10-CM | POA: Diagnosis not present

## 2021-10-14 DIAGNOSIS — I4891 Unspecified atrial fibrillation: Secondary | ICD-10-CM

## 2021-10-14 DIAGNOSIS — I10 Essential (primary) hypertension: Secondary | ICD-10-CM | POA: Diagnosis not present

## 2021-10-14 DIAGNOSIS — Z6832 Body mass index (BMI) 32.0-32.9, adult: Secondary | ICD-10-CM | POA: Insufficient documentation

## 2021-10-14 DIAGNOSIS — M1990 Unspecified osteoarthritis, unspecified site: Secondary | ICD-10-CM | POA: Insufficient documentation

## 2021-10-14 DIAGNOSIS — F419 Anxiety disorder, unspecified: Secondary | ICD-10-CM | POA: Diagnosis not present

## 2021-10-14 DIAGNOSIS — G4730 Sleep apnea, unspecified: Secondary | ICD-10-CM | POA: Insufficient documentation

## 2021-10-14 DIAGNOSIS — I4819 Other persistent atrial fibrillation: Secondary | ICD-10-CM | POA: Diagnosis not present

## 2021-10-14 DIAGNOSIS — Z9989 Dependence on other enabling machines and devices: Secondary | ICD-10-CM

## 2021-10-14 DIAGNOSIS — Z79899 Other long term (current) drug therapy: Secondary | ICD-10-CM | POA: Diagnosis not present

## 2021-10-14 HISTORY — PX: CARDIOVERSION: SHX1299

## 2021-10-14 LAB — POCT I-STAT, CHEM 8
BUN: 10 mg/dL (ref 8–23)
Calcium, Ion: 1.17 mmol/L (ref 1.15–1.40)
Chloride: 104 mmol/L (ref 98–111)
Creatinine, Ser: 0.9 mg/dL (ref 0.44–1.00)
Glucose, Bld: 103 mg/dL — ABNORMAL HIGH (ref 70–99)
HCT: 40 % (ref 36.0–46.0)
Hemoglobin: 13.6 g/dL (ref 12.0–15.0)
Potassium: 4.1 mmol/L (ref 3.5–5.1)
Sodium: 141 mmol/L (ref 135–145)
TCO2: 29 mmol/L (ref 22–32)

## 2021-10-14 SURGERY — CARDIOVERSION
Anesthesia: General

## 2021-10-14 MED ORDER — LIDOCAINE 2% (20 MG/ML) 5 ML SYRINGE
INTRAMUSCULAR | Status: DC | PRN
  Administered 2021-10-14: 60 mg via INTRAVENOUS

## 2021-10-14 MED ORDER — SODIUM CHLORIDE 0.9 % IV SOLN
INTRAVENOUS | Status: DC | PRN
  Administered 2021-10-14: 500 mL via INTRAVENOUS

## 2021-10-14 MED ORDER — SODIUM CHLORIDE 0.9 % IV SOLN: INTRAVENOUS | Status: DC

## 2021-10-14 MED ORDER — PROPOFOL 10 MG/ML IV BOLUS
INTRAVENOUS | Status: DC | PRN
  Administered 2021-10-14: 60 mg via INTRAVENOUS

## 2021-10-14 NOTE — CV Procedure (Signed)
Procedure:   DCCV  Indication:  Symptomatic atrial flutter  Procedure Note:  The patient signed informed consent.  They have had had therapeutic anticoagulation with Eliquis greater than 3 weeks.  Anesthesia was administered by Maude Leriche, CRNA.  Adequate airway was maintained throughout and vital followed per protocol.  They were cardioverted x 1 with 100J of biphasic synchronized energy.  They converted to NSR with rate 60s.  There were no apparent complications.  The patient had normal neuro status and respiratory status post procedure with vitals stable as recorded elsewhere.    Follow up:  They will continue on current medical therapy and follow up with cardiology as scheduled.  Oswaldo Milian, MD 10/14/2021 9:48 AM

## 2021-10-14 NOTE — Transfer of Care (Signed)
Immediate Anesthesia Transfer of Care Note  Patient: Mary Farrell  Procedure(s) Performed: CARDIOVERSION  Patient Location: PACU  Anesthesia Type:MAC  Level of Consciousness: awake, alert  and oriented  Airway & Oxygen Therapy: Patient Spontanous Breathing  Post-op Assessment: Report given to RN, Post -op Vital signs reviewed and stable, Patient moving all extremities X 4 and Patient able to stick tongue midline  Post vital signs: Reviewed  Last Vitals:  Vitals Value Taken Time  BP 125/60   Temp 98.3   Pulse 69 10/14/21 0950  Resp 20 10/14/21 0950  SpO2 96 % 10/14/21 0950  Vitals shown include unvalidated device data.  Last Pain:  Vitals:   10/14/21 0831  PainSc: 0-No pain         Complications: No notable events documented.

## 2021-10-14 NOTE — Interval H&P Note (Signed)
History and Physical Interval Note:  10/14/2021 9:35 AM  Mary Farrell  has presented today for surgery, with the diagnosis of atrial fibrilltion.  The various methods of treatment have been discussed with the patient and family. After consideration of risks, benefits and other options for treatment, the patient has consented to  Procedure(s): CARDIOVERSION (N/A) as a surgical intervention.  The patient's history has been reviewed, patient examined, no change in status, stable for surgery.  I have reviewed the patient's chart and labs.  Questions were answered to the patient's satisfaction.     [name]

## 2021-10-14 NOTE — Discharge Instructions (Signed)

## 2021-10-14 NOTE — Anesthesia Preprocedure Evaluation (Addendum)
Anesthesia Evaluation  Patient identified by MRN, date of birth, ID band Patient awake    Reviewed: Allergy & Precautions, NPO status , Patient's Chart, lab work & pertinent test results, reviewed documented beta blocker date and time   History of Anesthesia Complications Negative for: history of anesthetic complications  Airway Mallampati: III  TM Distance: >3 FB Neck ROM: Full    Dental  (+) Dental Advisory Given, Partial Upper   Pulmonary sleep apnea and Continuous Positive Airway Pressure Ventilation ,    Pulmonary exam normal        Cardiovascular hypertension, Pt. on home beta blockers and Pt. on medications + dysrhythmias Atrial Fibrillation + Valvular Problems/Murmurs (s/p MVR)  Rhythm:Irregular Rate:Tachycardia   '22 TTE - EF 60 to 65%. There is moderately elevated pulmonary artery systolic pressure. Left atrial size was moderately dilated. The mitral valve has been repaired/replaced. Mild MR. Aortic valve regurgitation is mild. Mild aortic valve sclerosis is present, with no evidence of aortic valve stenosis. Aortic valve area, by VTI measures 2.29 cm. Aortic valve mean gradient measures 6.5 mmHg. Aortic valve Vmax measures 1.69 m/s.     Neuro/Psych  Headaches, PSYCHIATRIC DISORDERS Anxiety  Vertigo      GI/Hepatic Neg liver ROS, hiatal hernia, GERD  Controlled,  Endo/Other   Obesity   Renal/GU negative Renal ROS     Musculoskeletal  (+) Arthritis ,   Abdominal   Peds  Hematology  On eliquis    Anesthesia Other Findings   Reproductive/Obstetrics                            Anesthesia Physical Anesthesia Plan  ASA: 3  Anesthesia Plan: General   Post-op Pain Management: Minimal or no pain anticipated   Induction: Intravenous  PONV Risk Score and Plan: 3 and Treatment may vary due to age or medical condition and Propofol infusion  Airway Management Planned: Natural  Airway and Mask  Additional Equipment: None  Intra-op Plan:   Post-operative Plan:   Informed Consent: I have reviewed the patients History and Physical, chart, labs and discussed the procedure including the risks, benefits and alternatives for the proposed anesthesia with the patient or authorized representative who has indicated his/her understanding and acceptance.       Plan Discussed with: CRNA and Anesthesiologist  Anesthesia Plan Comments:        Anesthesia Quick Evaluation

## 2021-10-14 NOTE — Anesthesia Postprocedure Evaluation (Signed)
Anesthesia Post Note  Patient: Mary Farrell  Procedure(s) Performed: CARDIOVERSION     Patient location during evaluation: PACU Anesthesia Type: General Level of consciousness: awake and alert Pain management: pain level controlled Vital Signs Assessment: post-procedure vital signs reviewed and stable Respiratory status: spontaneous breathing, nonlabored ventilation and respiratory function stable Cardiovascular status: stable and blood pressure returned to baseline Anesthetic complications: no   No notable events documented.  Last Vitals:  Vitals:   10/14/21 0951 10/14/21 1005  BP: 125/60 (!) 108/53  Pulse: 68 61  Resp: 17   Temp: 36.4 C   SpO2: 94% 96%    Last Pain:  Vitals:   10/14/21 0951  TempSrc: Temporal  PainSc: 0-No pain                 Audry Pili

## 2021-10-15 ENCOUNTER — Other Ambulatory Visit: Payer: Self-pay | Admitting: Student

## 2021-10-15 NOTE — Telephone Encounter (Signed)
Rx request sent to pharmacy.  

## 2021-10-15 NOTE — Telephone Encounter (Signed)
This is Dr. Sheridan's pt.  °

## 2021-10-21 ENCOUNTER — Encounter: Payer: Self-pay | Admitting: Cardiology

## 2021-10-23 ENCOUNTER — Other Ambulatory Visit: Payer: Self-pay

## 2021-11-04 ENCOUNTER — Ambulatory Visit (INDEPENDENT_AMBULATORY_CARE_PROVIDER_SITE_OTHER): Payer: Medicare Other | Admitting: Family

## 2021-11-04 ENCOUNTER — Encounter (HOSPITAL_BASED_OUTPATIENT_CLINIC_OR_DEPARTMENT_OTHER): Payer: Self-pay | Admitting: Family

## 2021-11-04 VITALS — BP 110/62 | HR 62 | Ht 60.0 in | Wt 166.7 lb

## 2021-11-04 DIAGNOSIS — I4819 Other persistent atrial fibrillation: Secondary | ICD-10-CM

## 2021-11-04 DIAGNOSIS — Z953 Presence of xenogenic heart valve: Secondary | ICD-10-CM | POA: Diagnosis not present

## 2021-11-04 DIAGNOSIS — I10 Essential (primary) hypertension: Secondary | ICD-10-CM

## 2021-11-04 DIAGNOSIS — R42 Dizziness and giddiness: Secondary | ICD-10-CM

## 2021-11-04 DIAGNOSIS — D6859 Other primary thrombophilia: Secondary | ICD-10-CM

## 2021-11-04 DIAGNOSIS — I6523 Occlusion and stenosis of bilateral carotid arteries: Secondary | ICD-10-CM

## 2021-11-04 NOTE — Progress Notes (Signed)
Office Visit    Patient Name: Mary Farrell Date of Encounter: 11/04/2021  PCP:  Lawerance Cruel, [place]  Cardiologist:  Skeet Latch, MD  Advanced Practice Provider:  No care team member to display Electrophysiologist:  Will Meredith Leeds, MD      Chief Complaint    Mary Farrell is a 76 y.o. female presents today for follow up after cardioversion.   Past Medical History    Past Medical History:  Diagnosis Date   Anxiety 06/29/2012   Aortic regurgitation 08/13/2015   Arthritis    "hands, wrists, back, feet, toes" (06/24/2017)   Atrial flutter ([place]) 09/18/2015   Atypical chest pain 05/13/2016   Chest pain    remote cath in 1996 with NORMAL coronaries noted   CHF (congestive heart failure) ([place])    Dyspnea 10/30/2010   Essential hypertension 09/05/2013   Exogenous obesity    history of    GERD (gastroesophageal reflux disease)    Glaucoma, both eyes    Headache, migraine    "stopped in my 32's" (09/18/2015)   History of cardiovascular stress test 2007   showing no ischemia   Hypertension    Mitral stenosis and aortic insufficiency 06/23/2010   Mitral stenosis with insufficiency    Mitral stenosis with regurgitation    Murmur    of mitral stenosis and moderate aortic insufficiency       Nausea 05/01/2018   Obesity (BMI 30.0-34.9) 02/13/2021   OSA on CPAP    Palpitations    occasional   Paroxysmal A-fib ([place]) 06/24/2017   Pericardial effusion 09/18/2015   Persistent atrial fibrillation ([place]) 03/10/2017   Personal history of rheumatic heart disease    S/P minimally-invasive maze operation for atrial fibrillation 12/13/2018   Complete bilateral atrial lesion set using cryothermy and bipolar radiofrequency ablation with clipping of LA appendage via right mini-thoracotomy approach   S/P minimally-invasive mitral valve replacement with bioprosthetic valve 12/13/2018   31 mm [facility] Mitral stented bovine pericardial tissue valve    SBE (subacute bacterial endocarditis)    prophylaxis, patient unaware   Sliding hiatal hernia    Stress 02/13/2021   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   Vertigo 05/01/2018   Past Surgical History:  Procedure Laterality Date   ABDOMINAL HYSTERECTOMY  1975   APPENDECTOMY  1977   ATRIAL FIBRILLATION ABLATION N/A 03/10/2017   Procedure: ATRIAL FIBRILLATION ABLATION;  Surgeon: [name], MD;  Location: [place] CV [facility];  Service: Cardiovascular;  Laterality: N/A;   ATRIAL FIBRILLATION ABLATION  06/24/2017   ATRIAL FIBRILLATION ABLATION N/A 06/24/2017   Procedure: ATRIAL FIBRILLATION ABLATION;  Surgeon: [name], MD;  Location: [place] [facility];  Service: Cardiovascular;  Laterality: N/A;   BUNIONECTOMY Right 2000s   CARDIAC CATHETERIZATION  01/13/1995   normal coronary anatomy and mild mitral stenosis and mild pulmonary hypertension   CARDIOVERSION N/A 09/22/2015   Procedure: CARDIOVERSION;  Surgeon: Jerline Pain, MD;  Location: [facility] ENDOSCOPY;  Service: Cardiovascular;  Laterality: N/A;   CARDIOVERSION N/A 10/25/2016   Procedure: CARDIOVERSION;  Surgeon: Fay Records, MD;  Location: [place];  Service: Cardiovascular;  Laterality: N/A;   CARDIOVERSION N/A 04/15/2017   Procedure: CARDIOVERSION;  Surgeon: Josue Hector, MD;  Location: [facility] ENDOSCOPY;  Service: Cardiovascular;  Laterality: N/A;   CARDIOVERSION N/A 05/16/2017   Procedure: CARDIOVERSION;  Surgeon: Pixie Casino, MD;  Location: [place];  Service: Cardiovascular;  Laterality: N/A;  CARDIOVERSION N/A 01/24/2019   Procedure: CARDIOVERSION;  Surgeon: Pixie Casino, MD;  Location: [facility] ENDOSCOPY;  Service: Cardiovascular;  Laterality: N/A;   CARDIOVERSION N/A 10/14/2021   Procedure: CARDIOVERSION;  Surgeon: [name], MD;  Location: [facility] ENDOSCOPY;  Service: Cardiovascular;  Laterality: N/A;   CATARACT EXTRACTION W/ INTRAOCULAR LENS  IMPLANT, BILATERAL Bilateral 2013    COLONOSCOPY  2013   EYE SURGERY Bilateral    cataracts   LAPAROSCOPIC CHOLECYSTECTOMY  1997   MINIMALLY INVASIVE MAZE PROCEDURE N/A 12/13/2018   Procedure: MINIMALLY INVASIVE MAZE PROCEDURE using a 45 MM AtriClip.;  Surgeon: Rexene Alberts, MD;  Location: [place];  Service: Open Heart Surgery;  Laterality: N/A;   MITRAL VALVE REPLACEMENT Right 12/13/2018   Procedure: MINIMALLY INVASIVE MITRAL VALVE (MV) REPLACEMENT using a Magna Mitral Ease 31 MM Valve.;  Surgeon: Rexene Alberts, MD;  Location: [place];  Service: Open Heart Surgery;  Laterality: Right;   RIGHT/LEFT HEART CATH AND CORONARY ANGIOGRAPHY N/A 10/18/2018   Procedure: RIGHT/LEFT HEART CATH AND CORONARY ANGIOGRAPHY;  Surgeon: Leonie Man, MD;  Location: [place] CV LAB;  Service: Cardiovascular;  Laterality: N/A;   TEE WITHOUT CARDIOVERSION N/A 06/23/2017   Procedure: TRANSESOPHAGEAL ECHOCARDIOGRAM (TEE);  Surgeon: Fay Records, MD;  Location: [place];  Service: Cardiovascular;  Laterality: N/A;   TEE WITHOUT CARDIOVERSION N/A 10/17/2018   Procedure: TRANSESOPHAGEAL ECHOCARDIOGRAM (TEE);  Surgeon: Jerline Pain, MD;  Location: [facility] ENDOSCOPY;  Service: Cardiovascular;  Laterality: N/A;   TEE WITHOUT CARDIOVERSION N/A 12/13/2018   Procedure: TRANSESOPHAGEAL ECHOCARDIOGRAM (TEE);  Surgeon: Rexene Alberts, MD;  Location: [place];  Service: Open Heart Surgery;  Laterality: N/A;   TONSILLECTOMY AND ADENOIDECTOMY  1990s   UVULOPALATOPHARYNGOPLASTY, TONSILLECTOMY AND SEPTOPLASTY  1990s    Allergies  Allergies  Allergen Reactions   Tizanidine Nausea Only   Amoxicillin Diarrhea    Has patient had a PCN reaction causing immediate rash, facial/tongue/throat swelling, SOB or lightheadedness with hypotension: no Has patient had a PCN reaction causing severe rash involving mucus membranes or skin necrosis: no Has patient had a PCN reaction that required hospitalization: no Has patient had a PCN reaction occurring within the last 10  years: no If all of the above answers are "NO", then may proceed with Cephalosporin use.    Metoprolol Tartrate Other (See Comments)    Mouth sores "mouth tastes like mold"   Oxaprozin Nausea Only    History of Present Illness    Mary Farrell is a 76 y.o. female with a hx of moderate aortic regurgitation and mitral stenosis s/p surgical MVR, atrial clip, maze 11/2018, atrial fibrillation/flutter s/p atrial fibrillation ablation 03/10/2017 with repeat ablation 06/03/2017, hypertension last seen 10/06/21 by Dr. Curt Bears.  At last visit with Dr. Oval Linsey 09/03/21 her Valsartan was reduced to '160mg'$  daily due to lightheadedness and hypotension at home..   Follows with Dr. Curt Bears of EP.  She has been on multiple antiarrhythmics and presently on dofetilide.  Seen 09/25/2021 by Dr. Curt Bears.  Notes she went in atrial flutter a few weeks ago and felt poorly during episodes.  She was set up for cardioversion 10/14/2021 performed by Dr. Gardiner Rhyme.   Presents today for follow up. Feeling well since cardioversion. Reports no shortness of breath nor dyspnea on exertion. Reports no chest pain, pressure, or tightness. No edema, orthopnea, PND. Occasional palpitation that makes her feel a bit apprehensive. Still with lightheadedness most notable with position changes or turning her head a certain way. Known  BPPV follows with PT. No near syncope, syncope. No real improvement since reduced dose of Valsartan. Eats 3 meals per day. Drinks a couple cups of half caffeinated coffee in the morning, diluted tea with splenda. Drinks large glasses of decaffeinated tea probably 2-2.5. Did take a fluid pill yesterday for lower extremity edema, requiring only once per week. EKGs/Labs/Other Studies Reviewed:   The following studies were reviewed today:  TTE 01/23/19  Review of the above records today demonstrates:   1. Left ventricular ejection fraction, by visual estimation, is 55 to  60%. The left ventricle has normal function.  Left ventricular septal wall  thickness was mildly increased. Moderately increased left ventricular  posterior wall thickness. There is  mildly increased left ventricular hypertrophy.   2. Left ventricular diastolic parameters are indeterminate.   3. The left ventricle demonstrates regional wall motion abnormalities.   4. Elevated LVEDP. Hypokinesis of the basal septum consistent with  post-operative state.   5. Global right ventricle has normal systolic function.The right  ventricular size is normal. No increase in right ventricular wall  thickness.   6. Left atrial size was severely dilated.   7. Right atrial size was normal.   8. The mitral valve has been repaired/replaced. No evidence of mitral  valve regurgitation. No evidence of mitral stenosis.   9. The tricuspid valve is normal in structure.  10. The aortic valve is tricuspid. Aortic valve regurgitation is mild. No  evidence of aortic valve sclerosis or stenosis.  11. The pulmonic valve was normal in structure. Pulmonic valve  regurgitation is not visualized.  12. Normal pulmonary artery systolic pressure.  13. The inferior vena cava is normal in size with <50% respiratory  variability, suggesting right atrial pressure of 8 mmHg.     EKG:  EKG is  ordered today.  The ekg ordered today demonstrates NSR 62 bpm with first degree AV block PR 232 with poor R wave progression.   Recent Labs: 03/30/2021: Platelets 191 09/01/2021: Magnesium 2.3 10/14/2021: BUN 10; Creatinine, Ser 0.90; Hemoglobin 13.6; Potassium 4.1; Sodium 141  Recent Lipid Panel No results found for: "CHOL", "TRIG", "HDL", "CHOLHDL", "VLDL", "LDLCALC", "LDLDIRECT"  Risk Assessment/Calculations:   CHA2DS2-VASc Score = 4   This indicates a 4.8% annual risk of stroke. The patient's score is based upon: CHF History: 0 HTN History: 1 Diabetes History: 0 Stroke History: 0 Vascular Disease History: 0 Age Score: 2 Gender Score: 1     Home Medications   Current  Meds  Medication Sig   acetaminophen (TYLENOL) 500 MG tablet Take 500 mg by mouth daily as needed for moderate pain.   ALPRAZolam (XANAX) 0.25 MG tablet Take 0.25 mg by mouth at bedtime.   atorvastatin (LIPITOR) 10 MG tablet Take 10 mg by mouth daily.   Calcium Citrate-Vitamin D (CALCIUM + D PO) Take 1 tablet by mouth daily.   cetirizine (ZYRTEC) 10 MG tablet Take 10 mg by mouth at bedtime.    Cholecalciferol (DIALYVITE VITAMIN D 5000) 125 MCG (5000 UT) capsule Take 5,000 Units by mouth daily.   conjugated estrogens (PREMARIN) vaginal cream Place 1 Applicatorful vaginally See admin instructions. Use 5 times weekly   Cyanocobalamin (B-12) 5000 MCG CAPS Take 5,000 mcg by mouth 2 (two) times a week.   diltiazem (CARDIZEM CD) 120 MG 24 hr capsule TAKE 1 CAPSULE (120 MG TOTAL) BY MOUTH DAILY WITH LUNCH.   diltiazem (CARDIZEM) 30 MG tablet Take 1 tablet (30 mg total) by mouth 4 (four) times daily as  needed (for heart rates greater than 100).   dofetilide (TIKOSYN) 500 MCG capsule TAKE 1 CAPSULE BY MOUTH 2 TIMES DAILY.   dorzolamide-timolol (COSOPT) 22.3-6.8 MG/ML ophthalmic solution Place 1 drop into both eyes 2 (two) times daily.   ELIQUIS 5 MG TABS tablet TAKE 1 TABLET BY MOUTH TWICE  DAILY   furosemide (LASIX) 20 MG tablet Take 1 tablet (20 mg total) by mouth daily as needed for fluid. For >3lb/overnight or >5lb/weekly note dose   gabapentin (NEURONTIN) 300 MG capsule Take 300 mg by mouth 2 (two) times daily.   hydrALAZINE (APRESOLINE) 25 MG tablet TAKE 1 TABLET DAILY AS NEEDED FOR BLOOD PRESSURE ABOVE 140   latanoprost (XALATAN) 0.005 % ophthalmic solution Place 1 drop into both eyes at bedtime.    magnesium oxide (MAG-OX) 400 (240 Mg) MG tablet TAKE 0.5 TABLETS (200 MG TOTAL) BY MOUTH DAILY. (Patient taking differently: Take 400 mg by mouth every other day.)   meclizine (ANTIVERT) 25 MG tablet Take 1 tablet by mouth 3 (three) times daily as needed for dizziness.   Multiple Vitamin (MULTIVITAMIN  WITH MINERALS) TABS tablet Take 1 tablet by mouth daily.   NON FORMULARY Apply 1 application topically 2 (two) times daily as needed (for eczema). Traimcinolone/[employer] Moist Cream   oxybutynin (DITROPAN-XL) 10 MG 24 hr tablet Take 10 mg by mouth daily.   polyethylene glycol (MIRALAX / GLYCOLAX) 17 g packet Take 17 g by mouth every other day.   potassium chloride (KLOR-CON) 10 MEQ tablet Take 1 tablet (10 mEq total) by mouth daily as needed (when you take Lasix).   PRESCRIPTION MEDICATION Inhale into the lungs at bedtime. CPAP   Probiotic Product (PROBIOTIC PO) Take 1 capsule by mouth daily.   valsartan (DIOVAN) 160 MG tablet Take 1 tablet (160 mg total) by mouth daily.     Review of Systems      All other systems reviewed and are otherwise negative except as noted above.  Physical Exam    VS:  BP 110/62 (BP Location: Left Arm, Patient Position: Sitting, Cuff Size: Large)   Pulse 62   Ht 5' (1.524 m)   Wt 166 lb 11.2 oz (75.6 kg)   BMI 32.56 kg/m  , BMI Body mass index is 32.56 kg/m.  Wt Readings from Last 3 Encounters:  11/04/21 166 lb 11.2 oz (75.6 kg)  10/06/21 164 lb 12.8 oz (74.8 kg)  09/03/21 165 lb 12.8 oz (75.2 kg)    GEN: Well nourished, overweight,  well developed, in no acute distress. HEENT: normal. Neck: Supple, no JVD, carotid bruits, or masses. Cardiac: RRR, no murmurs, rubs, or gallops. No clubbing, cyanosis, edema.  Radials/PT 2+ and equal bilaterally.  Respiratory:  Respirations regular and unlabored, clear to auscultation bilaterally. GI: Soft, nontender, nondistended. MS: No deformity or atrophy. Skin: Warm and dry, no rash. Neuro:  Strength and sensation are intact. Psych: Normal affect.  Assessment & Plan    Persistent atiral fibrillation / Hypercoagulable state - Maintaining NSR. Has ablation upcoming 01/2022 with Dr. Curt Bears. Continue Tikosyn, Eliquis. CHA2DS2-VASc Score = 4 [CHF History: 0, HTN History: 1, Diabetes History: 0, Stroke History: 0, Vascular  Disease History: 0, Age Score: 2, Gender Score: 1].  Therefore, the patient's annual risk of stroke is 4.8 %.    Denies bleeding complications .  HTN - BP well controlled. Continue current antihypertensive regimen.    MS s/p MVR - Continue SBE prophylaxis. Update echocardiogram for monitoring.   Bilateral carotid stenosis - 09/2018 bilateral  1-39% stenosis. Update echocardiogram due to lightheadedness. Notes no amaurosis fugax.         Disposition: Follow up in 4-6 month(s) with Skeet Latch, MD or APP.  Signed, Loel Dubonnet, NP 11/04/2021, 2:02 PM [place]

## 2021-11-04 NOTE — Patient Instructions (Addendum)
Medication Instructions:  Continue your current medications.   When you have your colonoscopy - your gastroenterologist will need to contact our office for guidance regarding when and how long to hold your Eliquis before the procedure.   *If you need a refill on your cardiac medications before your next appointment, please call your pharmacy*  Lab Work: None ordered today.   Testing/Procedures: Your physician has requested that you have an echocardiogram. Echocardiography is a painless test that uses sound waves to create images of your heart. It provides your doctor with information about the size and shape of your heart and how well your heart's chambers and valves are working. This procedure takes approximately one hour. There are no restrictions for this procedure.   Your physician has requested that you have a carotid duplex. This test is an ultrasound of the carotid arteries in your neck. It looks at blood flow through these arteries that supply the brain with blood. Allow one hour for this exam. There are no restrictions or special instructions.  Follow-Up: At [facility], you and your health needs are our priority.  As part of our continuing mission to provide you with exceptional heart care, we have created designated Provider Care Teams.  These Care Teams include your primary Cardiologist (physician) and Advanced Practice Providers (APPs -  Physician Assistants and Nurse Practitioners) who all work together to provide you with the care you need, when you need it.  We recommend signing up for the patient portal called "MyChart".  Sign up information is provided on this After Visit Summary.  MyChart is used to connect with patients for Virtual Visits (Telemedicine).  Patients are able to view lab/test results, encounter notes, upcoming appointments, etc.  Non-urgent messages can be sent to your provider as well.   To learn more about what you can do with MyChart, go to  [email or web address].    Your next appointment:   4-6 month(s)  The format for your next appointment:   In Person  Provider:   Skeet Latch, MD or Laurann Montana, NP    Other Instructions  Heart Healthy Diet Recommendations: A low-salt diet is recommended. Meats should be grilled, baked, or boiled. Avoid fried foods. Focus on lean protein sources like fish or chicken with vegetables and fruits. The American Heart Association is a Microbiologist!  American Heart Association Diet and Lifeystyle Recommendations   Exercise recommendations: The American Heart Association recommends 150 minutes of moderate intensity exercise weekly. Try 30 minutes of moderate intensity exercise 4-5 times per week. This could include walking, jogging, or swimming.  To prevent or reduce lower extremity swelling: Eat a low salt diet. Salt makes the body hold onto extra fluid which causes swelling. Sit with legs elevated. For example, in the recliner or on an [place].  Wear knee-high compression stockings during the daytime. Ones labeled 15-20 mmHg provide good compression.

## 2021-11-05 ENCOUNTER — Encounter: Payer: Self-pay | Admitting: Cardiology

## 2021-11-06 MED ORDER — CARVEDILOL 3.125 MG PO TABS: 3.1250 mg | ORAL_TABLET | Freq: Two times a day (BID) | ORAL | Status: DC

## 2021-11-06 NOTE — Telephone Encounter (Signed)
Patient had recently stopped coreg post cardioversion (self stopped since she was worried about hypotension from a conversation with cardioversion MD). Patient has converted to normal rhythm now according to her Kardia HR 69. BP 120/72. Per Adline Peals PA resume coreg at 3.'125mg'$  BID call with update of rhythm/rate on Monday. Pt in agreement.

## 2021-11-09 ENCOUNTER — Telehealth (HOSPITAL_COMMUNITY): Payer: Self-pay | Admitting: *Deleted

## 2021-11-09 MED ORDER — DILTIAZEM HCL ER COATED BEADS 180 MG PO CP24: 180.0000 mg | ORAL_CAPSULE | Freq: Every day | ORAL | 1 refills | Status: DC

## 2021-11-09 NOTE — Telephone Encounter (Signed)
Patient continues in AF. Taking PRN cardizem frequently. HR currently 70 but without PRN dilt her Heart rates are 120s. BP 128/61. Discussed with Adline Peals PA will increase daily cardizem to '180mg'$  once a day with follow up later in week. Pt in agreement.

## 2021-11-12 ENCOUNTER — Ambulatory Visit (HOSPITAL_COMMUNITY)
Admission: RE | Admit: 2021-11-12 | Discharge: 2021-11-12 | Disposition: A | Discharge status: Home / Self Care | Payer: Medicare Other | Source: Ambulatory Visit | Attending: Physician Assistant | Admitting: Physician Assistant

## 2021-11-12 ENCOUNTER — Encounter (HOSPITAL_COMMUNITY): Payer: Self-pay | Admitting: Physician Assistant

## 2021-11-12 VITALS — BP 122/62 | HR 74 | Ht 60.0 in | Wt 167.8 lb

## 2021-11-12 DIAGNOSIS — I4892 Unspecified atrial flutter: Secondary | ICD-10-CM | POA: Diagnosis not present

## 2021-11-12 DIAGNOSIS — I050 Rheumatic mitral stenosis: Secondary | ICD-10-CM | POA: Diagnosis not present

## 2021-11-12 DIAGNOSIS — G4733 Obstructive sleep apnea (adult) (pediatric): Secondary | ICD-10-CM | POA: Insufficient documentation

## 2021-11-12 DIAGNOSIS — I10 Essential (primary) hypertension: Secondary | ICD-10-CM | POA: Insufficient documentation

## 2021-11-12 DIAGNOSIS — I4819 Other persistent atrial fibrillation: Secondary | ICD-10-CM | POA: Diagnosis present

## 2021-11-12 DIAGNOSIS — D6869 Other thrombophilia: Secondary | ICD-10-CM

## 2021-11-12 NOTE — Progress Notes (Signed)
Primary Care Physician: Lawerance Cruel, MD Referring Physician: Dr. Gillian Scarce f/u   Mary Farrell is a 76 y.o. female with a h/o  persistent atrial fibrillation, mitral valve replacement with Maze procedure 11/2018 who was admitted for recurrent atrial arrhythmias 01/22/19 to 01/28/19.  She was admitted for persistent  atrial fibrillation/flutter.    She underwent cardioversion and was started on flecainide therapy.  She had recurrent atrial flutter despite flecainide therapy, and underwent Tikosyn loading while in-house.  She was maintained on dofetilide 500 mcg twice daily.  Her QTC remained around 490-500 ms.  The electrophysiology attending, Jolyn Nap, MD, reviewed her EKG prior to discharge and agreed with the dose of 500 mcg twice daily.   Now in the clinic she is staying in SR at 55 bpm. qtc is stable at 476 ms. No concerns voiced today. She is feeling improved. CHA2DS2VASc of at least 4 and continues on wafarin.   Follow up in the AF clinic 09/01/21. Patient seen 08/26/21 and was found to be in rapid atrial flutter. She was started on extended release diltiazem. She had some dizziness a couple days later associated with low BP and her carvedilol was decreased. She is back in SR today.   Follow up in the AF clinic 11/12/21. Patient reports that she felt well after her DCCV on 10/14/21 for about 3 weeks. Then she started having palpitations and fatigue. Her carvedilol was resumed and her diltiazem was increased. She is in rate controlled atrial flutter today.   Today, she denies symptoms of chest pain, shortness of breath, orthopnea, PND, lower extremity edema, dizziness, presyncope, syncope, or neurologic sequela. The patient is tolerating medications without difficulties and is otherwise without complaint today.   Past Medical History:  Diagnosis Date   Anxiety 06/29/2012   Aortic regurgitation 08/13/2015   Arthritis    "hands, wrists, back, feet, toes" (06/24/2017)   Atrial flutter  ([place]) 09/18/2015   Atypical chest pain 05/13/2016   Chest pain    remote cath in 1996 with NORMAL coronaries noted   CHF (congestive heart failure) ([place])    Dyspnea 10/30/2010   Essential hypertension 09/05/2013   Exogenous obesity    history of    GERD (gastroesophageal reflux disease)    Glaucoma, both eyes    Headache, migraine    "stopped in my 18's" (09/18/2015)   History of cardiovascular stress test 2007   showing no ischemia   Hypertension    Mitral stenosis and aortic insufficiency 06/23/2010   Mitral stenosis with insufficiency    Mitral stenosis with regurgitation    Murmur    of mitral stenosis and moderate aortic insufficiency       Nausea 05/01/2018   Obesity (BMI 30.0-34.9) 02/13/2021   OSA on CPAP    Palpitations    occasional   Paroxysmal A-fib ([place]) 06/24/2017   Pericardial effusion 09/18/2015   Persistent atrial fibrillation ([place]) 03/10/2017   Personal history of rheumatic heart disease    S/P minimally-invasive maze operation for atrial fibrillation 12/13/2018   Complete bilateral atrial lesion set using cryothermy and bipolar radiofrequency ablation with clipping of LA appendage via right mini-thoracotomy approach   S/P minimally-invasive mitral valve replacement with bioprosthetic valve 12/13/2018   31 mm [facility] Mitral stented bovine pericardial tissue valve   SBE (subacute bacterial endocarditis)    prophylaxis, patient unaware   Sliding hiatal hernia    Stress 02/13/2021   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   Vertigo 05/01/2018   Past  Surgical History:  Procedure Laterality Date   ABDOMINAL HYSTERECTOMY  1975   APPENDECTOMY  1977   ATRIAL FIBRILLATION ABLATION N/A 03/10/2017   Procedure: ATRIAL FIBRILLATION ABLATION;  Surgeon: [name], MD;  Location: [place] CV LAB;  Service: Cardiovascular;  Laterality: N/A;   ATRIAL FIBRILLATION ABLATION  06/24/2017   ATRIAL FIBRILLATION ABLATION N/A 06/24/2017   Procedure: ATRIAL  FIBRILLATION ABLATION;  Surgeon: [name], MD;  Location: [place] [facility];  Service: Cardiovascular;  Laterality: N/A;   BUNIONECTOMY Right 2000s   CARDIAC CATHETERIZATION  01/13/1995   normal coronary anatomy and mild mitral stenosis and mild pulmonary hypertension   CARDIOVERSION N/A 09/22/2015   Procedure: CARDIOVERSION;  Surgeon: Jerline Pain, MD;  Location: [place];  Service: Cardiovascular;  Laterality: N/A;   CARDIOVERSION N/A 10/25/2016   Procedure: CARDIOVERSION;  Surgeon: Fay Records, MD;  Location: [place];  Service: Cardiovascular;  Laterality: N/A;   CARDIOVERSION N/A 04/15/2017   Procedure: CARDIOVERSION;  Surgeon: Josue Hector, MD;  Location: [facility] ENDOSCOPY;  Service: Cardiovascular;  Laterality: N/A;   CARDIOVERSION N/A 05/16/2017   Procedure: CARDIOVERSION;  Surgeon: Pixie Casino, MD;  Location: [facility] ENDOSCOPY;  Service: Cardiovascular;  Laterality: N/A;   CARDIOVERSION N/A 01/24/2019   Procedure: CARDIOVERSION;  Surgeon: Pixie Casino, MD;  Location: [facility] ENDOSCOPY;  Service: Cardiovascular;  Laterality: N/A;   CARDIOVERSION N/A 10/14/2021   Procedure: CARDIOVERSION;  Surgeon: [name], MD;  Location: [facility] ENDOSCOPY;  Service: Cardiovascular;  Laterality: N/A;   CATARACT EXTRACTION W/ INTRAOCULAR LENS  IMPLANT, BILATERAL Bilateral 2013   COLONOSCOPY  2013   EYE SURGERY Bilateral    cataracts   LAPAROSCOPIC CHOLECYSTECTOMY  1997   MINIMALLY INVASIVE MAZE PROCEDURE N/A 12/13/2018   Procedure: MINIMALLY INVASIVE MAZE PROCEDURE using a 45 MM AtriClip.;  Surgeon: Rexene Alberts, MD;  Location: [place];  Service: Open Heart Surgery;  Laterality: N/A;   MITRAL VALVE REPLACEMENT Right 12/13/2018   Procedure: MINIMALLY INVASIVE MITRAL VALVE (MV) REPLACEMENT using a Magna Mitral Ease 31 MM Valve.;  Surgeon: Rexene Alberts, MD;  Location: [place];  Service: Open Heart Surgery;  Laterality: Right;   RIGHT/LEFT HEART CATH AND CORONARY ANGIOGRAPHY  N/A 10/18/2018   Procedure: RIGHT/LEFT HEART CATH AND CORONARY ANGIOGRAPHY;  Surgeon: Leonie Man, MD;  Location: [place] [facility] LAB;  Service: Cardiovascular;  Laterality: N/A;   TEE WITHOUT CARDIOVERSION N/A 06/23/2017   Procedure: TRANSESOPHAGEAL ECHOCARDIOGRAM (TEE);  Surgeon: Fay Records, MD;  Location: [place];  Service: Cardiovascular;  Laterality: N/A;   TEE WITHOUT CARDIOVERSION N/A 10/17/2018   Procedure: TRANSESOPHAGEAL ECHOCARDIOGRAM (TEE);  Surgeon: Jerline Pain, MD;  Location: [facility] ENDOSCOPY;  Service: Cardiovascular;  Laterality: N/A;   TEE WITHOUT CARDIOVERSION N/A 12/13/2018   Procedure: TRANSESOPHAGEAL ECHOCARDIOGRAM (TEE);  Surgeon: Rexene Alberts, MD;  Location: [place];  Service: Open Heart Surgery;  Laterality: N/A;   TONSILLECTOMY AND ADENOIDECTOMY  1990s   UVULOPALATOPHARYNGOPLASTY, TONSILLECTOMY AND SEPTOPLASTY  1990s    Current Outpatient Medications  Medication Sig Dispense Refill   acetaminophen (TYLENOL) 500 MG tablet Take 500 mg by mouth daily as needed for moderate pain.     ALPRAZolam (XANAX) 0.25 MG tablet Take 0.25 mg by mouth at bedtime.     atorvastatin (LIPITOR) 10 MG tablet Take 10 mg by mouth daily.     Calcium Citrate-Vitamin D (CALCIUM + D PO) Take 1 tablet by mouth daily.     carvedilol (COREG) 3.125 MG  tablet Take 1 tablet (3.125 mg total) by mouth 2 (two) times daily with a meal.     cetirizine (ZYRTEC) 10 MG tablet Take 10 mg by mouth at bedtime.      Cholecalciferol (DIALYVITE VITAMIN D 5000) 125 MCG (5000 UT) capsule Take 5,000 Units by mouth daily.     conjugated estrogens (PREMARIN) vaginal cream Place 1 Applicatorful vaginally See admin instructions. Use 5 times weekly     Cyanocobalamin (B-12) 5000 MCG CAPS Take 5,000 mcg by mouth 2 (two) times a week.     diltiazem (CARDIZEM CD) 180 MG 24 hr capsule Take 1 capsule (180 mg total) by mouth daily with lunch. 30 capsule 1   diltiazem (CARDIZEM) 30 MG tablet Take 1 tablet (30 mg total)  by mouth 4 (four) times daily as needed (for heart rates greater than 100). 60 tablet 3   dofetilide (TIKOSYN) 500 MCG capsule TAKE 1 CAPSULE BY MOUTH 2 TIMES DAILY. 180 capsule 3   dorzolamide-timolol (COSOPT) 22.3-6.8 MG/ML ophthalmic solution Place 1 drop into both eyes 2 (two) times daily.     ELIQUIS 5 MG TABS tablet TAKE 1 TABLET BY MOUTH TWICE  DAILY 180 tablet 3   furosemide (LASIX) 20 MG tablet Take 1 tablet (20 mg total) by mouth daily as needed for fluid. For >3lb/overnight or >5lb/weekly note dose 30 tablet 4   gabapentin (NEURONTIN) 300 MG capsule Take 300 mg by mouth 2 (two) times daily.     hydrALAZINE (APRESOLINE) 25 MG tablet TAKE 1 TABLET DAILY AS NEEDED FOR BLOOD PRESSURE ABOVE 140 30 tablet 5   latanoprost (XALATAN) 0.005 % ophthalmic solution Place 1 drop into both eyes at bedtime.   6   magnesium oxide (MAG-OX) 400 (240 Mg) MG tablet TAKE 0.5 TABLETS (200 MG TOTAL) BY MOUTH DAILY. (Patient taking differently: Take 400 mg by mouth every other day.) 45 tablet 2   meclizine (ANTIVERT) 25 MG tablet Take 1 tablet by mouth 3 (three) times daily as needed for dizziness.     Multiple Vitamin (MULTIVITAMIN WITH MINERALS) TABS tablet Take 1 tablet by mouth daily.     NON FORMULARY Apply 1 application topically 2 (two) times daily as needed (for eczema). Traimcinolone/[employer] Moist Cream     oxybutynin (DITROPAN-XL) 10 MG 24 hr tablet Take 10 mg by mouth daily.     polyethylene glycol (MIRALAX / GLYCOLAX) 17 g packet Take 17 g by mouth every other day.     potassium chloride (KLOR-CON) 10 MEQ tablet Take 1 tablet (10 mEq total) by mouth daily as needed (when you take Lasix). 30 tablet 3   PRESCRIPTION MEDICATION Inhale into the lungs at bedtime. CPAP     Probiotic Product (PROBIOTIC PO) Take 1 capsule by mouth daily.     valsartan (DIOVAN) 160 MG tablet Take 1 tablet (160 mg total) by mouth daily. 90 tablet 3   No current facility-administered medications for this encounter.    Allergies   Allergen Reactions   Tizanidine Nausea Only   Amoxicillin Diarrhea    Has patient had a PCN reaction causing immediate rash, facial/tongue/throat swelling, SOB or lightheadedness with hypotension: no Has patient had a PCN reaction causing severe rash involving mucus membranes or skin necrosis: no Has patient had a PCN reaction that required hospitalization: no Has patient had a PCN reaction occurring within the last 10 years: no If all of the above answers are "NO", then may proceed with Cephalosporin use.    Metoprolol Tartrate Other (See  Comments)    Mouth sores "mouth tastes like mold"   Oxaprozin Nausea Only    Social History   Socioeconomic History   Marital status: Married    Spouse name: Not on file   Number of children: Not on file   Years of education: Not on file   Highest education level: Not on file  Occupational History   Occupation: Retired  Tobacco Use   Smoking status: Never   Smokeless tobacco: Never   Tobacco comments:    Never smoke 09/01/21  Vaping Use   Vaping Use: Never used  Substance and Sexual Activity   Alcohol use: Yes    Comment: rarely   Drug use: No   Sexual activity: Not Currently  Other Topics Concern   Not on file  Social History Narrative   Lives with husband in [place].   Social Determinants of Health   Financial Resource Strain: Not on file  Food Insecurity: Not on file  Transportation Needs: Not on file  Physical Activity: Not on file  Stress: Not on file  Social Connections: Not on file  Intimate Partner Violence: Not on file    Family History  Problem Relation Age of Onset   Heart attack Mother    Hypertension Brother    Kidney disease Brother    Diabetes Brother    Heart failure Maternal Grandfather     ROS- All systems are reviewed and negative except as per the HPI above  Physical Exam: Vitals:   11/12/21 1536  BP: 122/62  Pulse: 74  Weight: 76.1 kg  Height: 5' (1.524 m)    Wt Readings from Last 3  Encounters:  11/12/21 76.1 kg  11/04/21 75.6 kg  10/06/21 74.8 kg    Labs: Lab Results  Component Value Date   NA 141 10/14/2021   K 4.1 10/14/2021   CL 104 10/14/2021   CO2 25 09/01/2021   GLUCOSE 103 (H) 10/14/2021   BUN 10 10/14/2021   CREATININE 0.90 10/14/2021   CALCIUM 8.7 (L) 09/01/2021   MG 2.3 09/01/2021   Lab Results  Component Value Date   INR 2.6 02/23/2019   No results found for: "CHOL", "HDL", "LDLCALC", "TRIG"   GEN- The patient is a well appearing elderly female, alert and oriented x 3 today.   HEENT-head normocephalic, atraumatic, sclera clear, conjunctiva pink, hearing intact, trachea midline. Lungs- Clear to ausculation bilaterally, normal work of breathing Heart- Regular rate and rhythm, no murmurs, rubs or gallops  GI- soft, NT, ND, + BS Extremities- no clubbing, cyanosis, or edema MS- no significant deformity or atrophy Skin- no rash or lesion Psych- euthymic mood, full affect Neuro- strength and sensation are intact   EKG-  Atypical atrial flutter with 2:1 block Vent. rate 74 BPM PR interval 182 ms QRS duration 92 ms QT/QTcB 432/479 ms   Epic  records reviewed   Assessment and Plan: 1. Persistent Afib/atrial flutter  S/p afib ablation x 2 2019 S/p Maze/ valve repair 11/2018 Recall, she had significant GI side effects with amiodarone previously. She would rather not go back to this medication if at all possible. She is scheduled for ablation with Dr Curt Bears on 02/23/22. Also question if she would be a candidate for convergent if her arrhythmias remain refractory.  Continue Tikosyn 500 mcg BID. QT stable. Continue Eliquis 5 mg BID Continue carvedilol 3.125 mg BID Continue diltiazem 180 mg daily. If she has issues with elevated heart rates, could add 120 mg daily.  Continue diltiazem  30 mg PRN q 4 hours.  2. CHA2DS2VASc score is 4 Continue Eliquis 5 mg BID  3. HTN Stable, no changes today.  4. OSA Encouraged compliance with CPAP  therapy.  5. Mitral stenosis Rheumatic, s/p MVR with bioprosthetic valve.   Follow up in the AF clinic in January as scheduled.    [facility] [address] [place], [place] [address] [phone]

## 2021-11-13 ENCOUNTER — Other Ambulatory Visit (HOSPITAL_COMMUNITY): Payer: Self-pay | Admitting: *Deleted

## 2021-11-13 MED ORDER — DILTIAZEM HCL ER COATED BEADS 120 MG PO CP24: 120.0000 mg | ORAL_CAPSULE | Freq: Every day | ORAL | Status: DC

## 2021-11-16 ENCOUNTER — Telehealth (HOSPITAL_COMMUNITY): Payer: Self-pay

## 2021-11-16 NOTE — Telephone Encounter (Signed)
Patient called in regarding her Atrial Flutter. She is concerned about her higher heart rates she is having at nighttime ranging from 130-135. Per Tilda Franco he would like for her to take her Diltiazem 180 mg in the morning and her Diltiazem 120 mg at bedtime. Consulted with patient about her up coming cardioversion which is scheduled for Friday. She was told to take her Diltiazem 180 mg the morning of her Cardioversion. Patient verbalized understanding.      Cardioversion scheduled for October 27th 2023  - Arrive at the [facility] and go to admitting at 10:30 am  - Do not eat or drink anything after midnight the night prior to your procedure.  - Take all your morning medication (except diabetic medications) with a sip of water prior to arrival.  - You will not be able to drive home after your procedure.  - Do NOT miss any doses of your blood thinner - if you should miss a dose please notify our office immediately.  - If you feel as if you go back into normal rhythm prior to scheduled cardioversion, please notify our office immediately. If your procedure is canceled in the cardioversion suite you will be charged a cancellation fee.

## 2021-11-18 ENCOUNTER — Ambulatory Visit (HOSPITAL_COMMUNITY)
Admission: RE | Admit: 2021-11-18 | Discharge: 2021-11-18 | Disposition: A | Discharge status: Home / Self Care | Payer: Medicare Other | Source: Ambulatory Visit | Attending: Physician Assistant | Admitting: Physician Assistant

## 2021-11-18 DIAGNOSIS — I484 Atypical atrial flutter: Secondary | ICD-10-CM | POA: Diagnosis present

## 2021-11-18 LAB — CBC
HCT: 39 % (ref 36.0–46.0)
Hemoglobin: 12 g/dL (ref 12.0–15.0)
MCH: 28.2 pg (ref 26.0–34.0)
MCHC: 30.8 g/dL (ref 30.0–36.0)
MCV: 91.5 fL (ref 80.0–100.0)
Platelets: 168 10*3/uL (ref 150–400)
RBC: 4.26 MIL/uL (ref 3.87–5.11)
RDW: 17.2 % — ABNORMAL HIGH (ref 11.5–15.5)
WBC: 6.7 10*3/uL (ref 4.0–10.5)
nRBC: 0 % (ref 0.0–0.2)

## 2021-11-18 LAB — BASIC METABOLIC PANEL
Anion gap: 10 (ref 5–15)
BUN: 12 mg/dL (ref 8–23)
CO2: 23 mmol/L (ref 22–32)
Calcium: 9 mg/dL (ref 8.9–10.3)
Chloride: 106 mmol/L (ref 98–111)
Creatinine, Ser: 0.82 mg/dL (ref 0.44–1.00)
GFR, Estimated: >60 mL/min (ref >60)
Glucose, Bld: 105 mg/dL — ABNORMAL HIGH (ref 70–99)
Potassium: 4 mmol/L (ref 3.5–5.1)
Sodium: 139 mmol/L (ref 135–145)

## 2021-11-18 LAB — BRAIN NATRIURETIC PEPTIDE: B Natriuretic Peptide: 73.9 pg/mL (ref 0.0–100.0)

## 2021-11-18 NOTE — Progress Notes (Signed)
Patient presents for ECG and lab work prior to [place]. ECG shows atypical atrial flutter with 2:1 block (flutter waves visible in lead V1) Proceed with DCCV as scheduled. Patient will hold her PM dose of diltiazem the night before DCCV. Follow up as scheduled.

## 2021-11-18 NOTE — H&P (View-Only) (Signed)
Patient presents for ECG and lab work prior to [place]. ECG shows atypical atrial flutter with 2:1 block (flutter waves visible in lead V1) Proceed with DCCV as scheduled. Patient will hold her PM dose of diltiazem the night before DCCV. Follow up as scheduled.

## 2021-11-19 ENCOUNTER — Other Ambulatory Visit (HOSPITAL_COMMUNITY): Payer: Self-pay | Admitting: Physician Assistant

## 2021-11-19 DIAGNOSIS — I484 Atypical atrial flutter: Secondary | ICD-10-CM

## 2021-11-20 ENCOUNTER — Other Ambulatory Visit: Payer: Self-pay

## 2021-11-20 ENCOUNTER — Ambulatory Visit (HOSPITAL_BASED_OUTPATIENT_CLINIC_OR_DEPARTMENT_OTHER): Payer: Medicare Other | Admitting: General Practice

## 2021-11-20 ENCOUNTER — Encounter (HOSPITAL_COMMUNITY)
Admission: RE | Disposition: A | Discharge status: Home / Self Care | Payer: Self-pay | Source: Ambulatory Visit | Attending: Internal Medicine

## 2021-11-20 ENCOUNTER — Ambulatory Visit (HOSPITAL_COMMUNITY): Payer: Medicare Other | Admitting: General Practice

## 2021-11-20 ENCOUNTER — Ambulatory Visit (HOSPITAL_COMMUNITY)
Admission: RE | Admit: 2021-11-20 | Discharge: 2021-11-20 | Disposition: A | Discharge status: Home / Self Care | Payer: Medicare Other | Source: Ambulatory Visit | Attending: Internal Medicine | Admitting: Internal Medicine

## 2021-11-20 DIAGNOSIS — G4730 Sleep apnea, unspecified: Secondary | ICD-10-CM

## 2021-11-20 DIAGNOSIS — I110 Hypertensive heart disease with heart failure: Secondary | ICD-10-CM | POA: Insufficient documentation

## 2021-11-20 DIAGNOSIS — Z7901 Long term (current) use of anticoagulants: Secondary | ICD-10-CM | POA: Diagnosis not present

## 2021-11-20 DIAGNOSIS — F419 Anxiety disorder, unspecified: Secondary | ICD-10-CM | POA: Insufficient documentation

## 2021-11-20 DIAGNOSIS — I484 Atypical atrial flutter: Secondary | ICD-10-CM

## 2021-11-20 DIAGNOSIS — Z952 Presence of prosthetic heart valve: Secondary | ICD-10-CM | POA: Insufficient documentation

## 2021-11-20 DIAGNOSIS — I509 Heart failure, unspecified: Secondary | ICD-10-CM | POA: Diagnosis not present

## 2021-11-20 DIAGNOSIS — I050 Rheumatic mitral stenosis: Secondary | ICD-10-CM | POA: Insufficient documentation

## 2021-11-20 DIAGNOSIS — I4819 Other persistent atrial fibrillation: Secondary | ICD-10-CM | POA: Diagnosis present

## 2021-11-20 DIAGNOSIS — I4892 Unspecified atrial flutter: Secondary | ICD-10-CM

## 2021-11-20 DIAGNOSIS — Z79899 Other long term (current) drug therapy: Secondary | ICD-10-CM | POA: Diagnosis not present

## 2021-11-20 DIAGNOSIS — G4733 Obstructive sleep apnea (adult) (pediatric): Secondary | ICD-10-CM | POA: Diagnosis not present

## 2021-11-20 HISTORY — PX: CARDIOVERSION: SHX1299

## 2021-11-20 SURGERY — CARDIOVERSION
Anesthesia: General

## 2021-11-20 MED ORDER — SODIUM CHLORIDE 0.9 % IV SOLN: INTRAVENOUS | Status: DC

## 2021-11-20 MED ORDER — LIDOCAINE 2% (20 MG/ML) 5 ML SYRINGE
INTRAMUSCULAR | Status: DC | PRN
  Administered 2021-11-20: 60 mg via INTRAVENOUS

## 2021-11-20 MED ORDER — PROPOFOL 10 MG/ML IV BOLUS
INTRAVENOUS | Status: DC | PRN
  Administered 2021-11-20: 60 mg via INTRAVENOUS

## 2021-11-20 NOTE — Anesthesia Procedure Notes (Signed)
Procedure Name: General with mask airway Date/Time: 11/20/2021 11:46 AM  Performed by: Dorann Lodge, CRNAPre-anesthesia Checklist: Patient identified, Emergency Drugs available, Suction available and Patient being monitored Patient Re-evaluated:Patient Re-evaluated prior to induction Oxygen Delivery Method: Ambu bag Preoxygenation: Pre-oxygenation with 100% oxygen Induction Type: IV induction Dental Injury: Teeth and Oropharynx as per pre-operative assessment

## 2021-11-20 NOTE — Interval H&P Note (Signed)
History and Physical Interval Note:  11/20/2021 10:42 AM  Mary Farrell  has presented today for surgery, with the diagnosis of atrial flutter.  The various methods of treatment have been discussed with the patient and family. After consideration of risks, benefits and other options for treatment, the patient has consented to  Procedure(s): CARDIOVERSION (N/A) as a surgical intervention.  The patient's history has been reviewed, patient examined, no change in status, stable for surgery.  I have reviewed the patient's chart and labs.  Questions were answered to the patient's satisfaction.     [name]

## 2021-11-20 NOTE — Transfer of Care (Addendum)
Immediate Anesthesia Transfer of Care Note  Patient: Mary Farrell  Procedure(s) Performed: CARDIOVERSION  Patient Location: Endoscopy Unit  Anesthesia Type:General  Level of Consciousness: awake and drowsy  Airway & Oxygen Therapy: Patient Spontanous Breathing  Post-op Assessment: Report given to RN and Post -op Vital signs reviewed and stable  Post vital signs: Reviewed and stable  Last Vitals:  Vitals Value Taken Time  BP 108/50 (68)   Temp    Pulse 55   Resp 14   SpO2 91     Last Pain:  Vitals:   11/20/21 1047  PainSc: 0-No pain         Complications: No notable events documented.

## 2021-11-20 NOTE — Discharge Instructions (Signed)

## 2021-11-20 NOTE — Anesthesia Preprocedure Evaluation (Signed)
Anesthesia Evaluation  Patient identified by MRN, date of birth, ID band Patient awake    Reviewed: Allergy & Precautions, H&P , NPO status , Patient's Chart, lab work & pertinent test results  Airway Mallampati: II   Neck ROM: full    Dental   Pulmonary sleep apnea ,    breath sounds clear to auscultation       Cardiovascular hypertension, +CHF  + dysrhythmias Atrial Fibrillation + Valvular Problems/Murmurs  Rhythm:regular Rate:Normal  S/p mini MVR and MAZE procedure 2020   Neuro/Psych  Headaches, PSYCHIATRIC DISORDERS Anxiety  Neuromuscular disease    GI/Hepatic hiatal hernia, GERD  ,  Endo/Other    Renal/GU      Musculoskeletal  (+) Arthritis ,   Abdominal   Peds  Hematology   Anesthesia Other Findings   Reproductive/Obstetrics                             Anesthesia Physical Anesthesia Plan  ASA: 3  Anesthesia Plan: General   Post-op Pain Management:    Induction: Intravenous  PONV Risk Score and Plan: 3 and Propofol infusion and Treatment may vary due to age or medical condition  Airway Management Planned: Mask  Additional Equipment:   Intra-op Plan:   Post-operative Plan:   Informed Consent: I have reviewed the patients History and Physical, chart, labs and discussed the procedure including the risks, benefits and alternatives for the proposed anesthesia with the patient or authorized representative who has indicated his/her understanding and acceptance.     Dental advisory given  Plan Discussed with: CRNA, Anesthesiologist and Surgeon  Anesthesia Plan Comments:         Anesthesia Quick Evaluation

## 2021-11-20 NOTE — CV Procedure (Signed)
CARDIOVERSION  INdication:  Atrial flutter   Patient sedated by anesthesia with 60 mg propofol and 60 mg  lidocaine intravenously     With pads in AP position, patient cardioverted to SR with 200 J synchronized biphasic energy  Procedure was without complication  12 lead EKG pending  [name] MD

## 2021-11-22 ENCOUNTER — Encounter (HOSPITAL_COMMUNITY): Payer: Self-pay | Admitting: Internal Medicine

## 2021-11-23 ENCOUNTER — Other Ambulatory Visit (HOSPITAL_BASED_OUTPATIENT_CLINIC_OR_DEPARTMENT_OTHER): Payer: Medicare Other

## 2021-11-23 ENCOUNTER — Encounter (HOSPITAL_BASED_OUTPATIENT_CLINIC_OR_DEPARTMENT_OTHER): Payer: Medicare Other

## 2021-11-23 NOTE — Anesthesia Postprocedure Evaluation (Signed)
Anesthesia Post Note  Patient: Mary Farrell  Procedure(s) Performed: CARDIOVERSION     Patient location during evaluation: Endoscopy Anesthesia Type: General Level of consciousness: awake and alert Pain management: pain level controlled Vital Signs Assessment: post-procedure vital signs reviewed and stable Respiratory status: spontaneous breathing, nonlabored ventilation, respiratory function stable and patient connected to nasal cannula oxygen Cardiovascular status: blood pressure returned to baseline and stable Postop Assessment: no apparent nausea or vomiting Anesthetic complications: no   No notable events documented.  Last Vitals:  Vitals:   11/20/21 1210 11/20/21 1220  BP: (!) 112/50 (!) 134/52  Pulse: (!) 46 (!) 46  Resp: 16 16  Temp:    SpO2: 94% 97%    Last Pain:  Vitals:   11/20/21 1220  TempSrc:   PainSc: 0-No pain                 Maame Dack S

## 2021-11-25 ENCOUNTER — Encounter (HOSPITAL_COMMUNITY): Payer: Self-pay | Admitting: Physician Assistant

## 2021-11-25 ENCOUNTER — Ambulatory Visit (HOSPITAL_COMMUNITY)
Admission: RE | Admit: 2021-11-25 | Discharge: 2021-11-25 | Disposition: A | Discharge status: Home / Self Care | Payer: Medicare Other | Source: Ambulatory Visit | Attending: Physician Assistant | Admitting: Physician Assistant

## 2021-11-25 VITALS — BP 144/60 | HR 51 | Ht 60.0 in | Wt 173.2 lb

## 2021-11-25 DIAGNOSIS — I050 Rheumatic mitral stenosis: Secondary | ICD-10-CM | POA: Insufficient documentation

## 2021-11-25 DIAGNOSIS — I4819 Other persistent atrial fibrillation: Secondary | ICD-10-CM | POA: Insufficient documentation

## 2021-11-25 DIAGNOSIS — G4733 Obstructive sleep apnea (adult) (pediatric): Secondary | ICD-10-CM | POA: Diagnosis not present

## 2021-11-25 DIAGNOSIS — I10 Essential (primary) hypertension: Secondary | ICD-10-CM | POA: Diagnosis not present

## 2021-11-25 DIAGNOSIS — Z7901 Long term (current) use of anticoagulants: Secondary | ICD-10-CM | POA: Insufficient documentation

## 2021-11-25 DIAGNOSIS — I484 Atypical atrial flutter: Secondary | ICD-10-CM | POA: Diagnosis not present

## 2021-11-25 DIAGNOSIS — Z79899 Other long term (current) drug therapy: Secondary | ICD-10-CM | POA: Insufficient documentation

## 2021-11-25 DIAGNOSIS — D6869 Other thrombophilia: Secondary | ICD-10-CM

## 2021-11-25 NOTE — Progress Notes (Signed)
Primary Care Physician: Lawerance Cruel, MD Referring Physician: Dr. Gillian Scarce f/u   Mary Farrell is a 76 y.o. female with a h/o  persistent atrial fibrillation, mitral valve replacement with Maze procedure 11/2018 who was admitted for recurrent atrial arrhythmias 01/22/19 to 01/28/19.  She was admitted for persistent  atrial fibrillation/flutter.    She underwent cardioversion and was started on flecainide therapy.  She had recurrent atrial flutter despite flecainide therapy, and underwent Tikosyn loading while in-house.  She was maintained on dofetilide 500 mcg twice daily.  Her QTC remained around 490-500 ms.  The electrophysiology attending, Jolyn Nap, MD, reviewed her EKG prior to discharge and agreed with the dose of 500 mcg twice daily.   Now in the clinic she is staying in SR at 55 bpm. qtc is stable at 476 ms. No concerns voiced today. She is feeling improved. CHA2DS2VASc of at least 4 and continues on wafarin.   Follow up in the AF clinic 09/01/21. Patient seen 08/26/21 and was found to be in rapid atrial flutter. She was started on extended release diltiazem. She had some dizziness a couple days later associated with low BP and her carvedilol was decreased. She is back in SR today.   Follow up in the AF clinic 11/12/21. Patient reports that she felt well after her DCCV on 10/14/21 for about 3 weeks. Then she started having palpitations and fatigue. Her carvedilol was resumed and her diltiazem was increased. She is in rate controlled atrial flutter today.   Follow up in the AF clinic 11/25/21. Patient called the clinic back to schedule DCCV as she was more symptomatic. She had repeat DCCV on 11/20/21. Patient feels much better in SR with more energy.   Today, she denies symptoms of palpitations, chest pain, shortness of breath, orthopnea, PND, lower extremity edema, dizziness, presyncope, syncope, or neurologic sequela. The patient is tolerating medications without difficulties and is  otherwise without complaint today.   Past Medical History:  Diagnosis Date   Anxiety 06/29/2012   Aortic regurgitation 08/13/2015   Arthritis    "hands, wrists, back, feet, toes" (06/24/2017)   Atrial flutter ([place]) 09/18/2015   Atypical chest pain 05/13/2016   Chest pain    remote cath in 1996 with NORMAL coronaries noted   CHF (congestive heart failure) ([place])    Dyspnea 10/30/2010   Essential hypertension 09/05/2013   Exogenous obesity    history of    GERD (gastroesophageal reflux disease)    Glaucoma, both eyes    Headache, migraine    "stopped in my 50's" (09/18/2015)   History of cardiovascular stress test 2007   showing no ischemia   Hypertension    Mitral stenosis and aortic insufficiency 06/23/2010   Mitral stenosis with insufficiency    Mitral stenosis with regurgitation    Murmur    of mitral stenosis and moderate aortic insufficiency       Nausea 05/01/2018   Obesity (BMI 30.0-34.9) 02/13/2021   OSA on CPAP    Palpitations    occasional   Paroxysmal A-fib ([place]) 06/24/2017   Pericardial effusion 09/18/2015   Persistent atrial fibrillation ([place]) 03/10/2017   Personal history of rheumatic heart disease    S/P minimally-invasive maze operation for atrial fibrillation 12/13/2018   Complete bilateral atrial lesion set using cryothermy and bipolar radiofrequency ablation with clipping of LA appendage via right mini-thoracotomy approach   S/P minimally-invasive mitral valve replacement with bioprosthetic valve 12/13/2018   31 mm [facility] Mitral stented bovine  pericardial tissue valve   SBE (subacute bacterial endocarditis)    prophylaxis, patient unaware   Sliding hiatal hernia    Stress 02/13/2021   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   Vertigo 05/01/2018   Past Surgical History:  Procedure Laterality Date   [place] N/A 03/10/2017   Procedure: ATRIAL FIBRILLATION ABLATION;  Surgeon:  [name], MD;  Location: [place] [facility];  Service: Cardiovascular;  Laterality: N/A;   ATRIAL FIBRILLATION ABLATION  06/24/2017   ATRIAL FIBRILLATION ABLATION N/A 06/24/2017   Procedure: ATRIAL FIBRILLATION ABLATION;  Surgeon: [name], MD;  Location: [place] [facility];  Service: Cardiovascular;  Laterality: N/A;   BUNIONECTOMY Right 2000s   CARDIAC CATHETERIZATION  01/13/1995   normal coronary anatomy and mild mitral stenosis and mild pulmonary hypertension   CARDIOVERSION N/A 09/22/2015   Procedure: CARDIOVERSION;  Surgeon: Jerline Pain, MD;  Location: [place];  Service: Cardiovascular;  Laterality: N/A;   CARDIOVERSION N/A 10/25/2016   Procedure: CARDIOVERSION;  Surgeon: Fay Records, MD;  Location: [place];  Service: Cardiovascular;  Laterality: N/A;   CARDIOVERSION N/A 04/15/2017   Procedure: CARDIOVERSION;  Surgeon: Josue Hector, MD;  Location: [facility] ENDOSCOPY;  Service: Cardiovascular;  Laterality: N/A;   CARDIOVERSION N/A 05/16/2017   Procedure: CARDIOVERSION;  Surgeon: Pixie Casino, MD;  Location: [facility] ENDOSCOPY;  Service: Cardiovascular;  Laterality: N/A;   CARDIOVERSION N/A 01/24/2019   Procedure: CARDIOVERSION;  Surgeon: Pixie Casino, MD;  Location: [facility] ENDOSCOPY;  Service: Cardiovascular;  Laterality: N/A;   CARDIOVERSION N/A 10/14/2021   Procedure: CARDIOVERSION;  Surgeon: [name], MD;  Location: [facility] ENDOSCOPY;  Service: Cardiovascular;  Laterality: N/A;   CARDIOVERSION N/A 11/20/2021   Procedure: CARDIOVERSION;  Surgeon: Fay Records, MD;  Location: [facility] ENDOSCOPY;  Service: Cardiovascular;  Laterality: N/A;   CATARACT EXTRACTION W/ INTRAOCULAR LENS  IMPLANT, BILATERAL Bilateral 2013   COLONOSCOPY  2013   EYE SURGERY Bilateral    cataracts   LAPAROSCOPIC CHOLECYSTECTOMY  1997   MINIMALLY INVASIVE MAZE PROCEDURE N/A 12/13/2018   Procedure: MINIMALLY INVASIVE MAZE PROCEDURE using a 45 MM AtriClip.;  Surgeon: Rexene Alberts, MD;  Location: [place];  Service: Open Heart Surgery;  Laterality: N/A;   MITRAL VALVE REPLACEMENT Right 12/13/2018   Procedure: MINIMALLY INVASIVE MITRAL VALVE (MV) REPLACEMENT using a Magna Mitral Ease 31 MM Valve.;  Surgeon: Rexene Alberts, MD;  Location: [place];  Service: Open Heart Surgery;  Laterality: Right;   RIGHT/LEFT HEART CATH AND CORONARY ANGIOGRAPHY N/A 10/18/2018   Procedure: RIGHT/LEFT HEART CATH AND CORONARY ANGIOGRAPHY;  Surgeon: Leonie Man, MD;  Location: [place] [facility];  Service: Cardiovascular;  Laterality: N/A;   TEE WITHOUT CARDIOVERSION N/A 06/23/2017   Procedure: TRANSESOPHAGEAL ECHOCARDIOGRAM (TEE);  Surgeon: Fay Records, MD;  Location: [place];  Service: Cardiovascular;  Laterality: N/A;   TEE WITHOUT CARDIOVERSION N/A 10/17/2018   Procedure: TRANSESOPHAGEAL ECHOCARDIOGRAM (TEE);  Surgeon: Jerline Pain, MD;  Location: [facility] ENDOSCOPY;  Service: Cardiovascular;  Laterality: N/A;   TEE WITHOUT CARDIOVERSION N/A 12/13/2018   Procedure: TRANSESOPHAGEAL ECHOCARDIOGRAM (TEE);  Surgeon: Rexene Alberts, MD;  Location: [place];  Service: Open Heart Surgery;  Laterality: N/A;   TONSILLECTOMY AND ADENOIDECTOMY  1990s   UVULOPALATOPHARYNGOPLASTY, TONSILLECTOMY AND SEPTOPLASTY  1990s    Current Outpatient Medications  Medication Sig Dispense Refill   acetaminophen (TYLENOL) 500 MG tablet Take 500 mg by  mouth daily as needed for moderate pain.     ALPRAZolam (XANAX) 0.25 MG tablet Take 0.125-0.25 mg by mouth See admin instructions. Take 0.25 mg at night, may take 0.125 mg dose as needed for anxiety     atorvastatin (LIPITOR) 10 MG tablet Take 10 mg by mouth at bedtime.     Calcium Citrate-Vitamin D (CALCIUM + D PO) Take 1 tablet by mouth daily.     carvedilol (COREG) 3.125 MG tablet Take 1 tablet (3.125 mg total) by mouth 2 (two) times daily with a meal.     cetirizine (ZYRTEC) 10 MG tablet Take 10 mg by mouth at bedtime.      Cholecalciferol (VITAMIN D) 50 MCG  (2000 UT) tablet Take 2,000 Units by mouth daily.     conjugated estrogens (PREMARIN) vaginal cream Place 1 Applicatorful vaginally 3 (three) times a week.     cyanocobalamin (VITAMIN B12) 1000 MCG tablet Take 1,000 mcg by mouth daily.     diltiazem (CARDIZEM CD) 120 MG 24 hr capsule Take 1 capsule (120 mg total) by mouth at bedtime. In addition to '180mg'$  at lunch     diltiazem (CARDIZEM CD) 180 MG 24 hr capsule Take 1 capsule (180 mg total) by mouth daily with lunch. 30 capsule 1   diltiazem (CARDIZEM) 30 MG tablet Take 1 tablet (30 mg total) by mouth 4 (four) times daily as needed (for heart rates greater than 100). 60 tablet 3   dofetilide (TIKOSYN) 500 MCG capsule TAKE 1 CAPSULE BY MOUTH 2 TIMES DAILY. 180 capsule 3   dorzolamide-timolol (COSOPT) 22.3-6.8 MG/ML ophthalmic solution Place 1 drop into both eyes 2 (two) times daily.     ELIQUIS 5 MG TABS tablet TAKE 1 TABLET BY MOUTH TWICE  DAILY 180 tablet 3   fluticasone (FLONASE) 50 MCG/ACT nasal spray Place 1 spray into both nostrils daily.     furosemide (LASIX) 20 MG tablet Take 1 tablet (20 mg total) by mouth daily as needed for fluid. For >3lb/overnight or >5lb/weekly note dose 30 tablet 4   gabapentin (NEURONTIN) 300 MG capsule Take 300 mg by mouth 3 (three) times daily.     hydrALAZINE (APRESOLINE) 25 MG tablet TAKE 1 TABLET DAILY AS NEEDED FOR BLOOD PRESSURE ABOVE 140 30 tablet 5   latanoprost (XALATAN) 0.005 % ophthalmic solution Place 1 drop into both eyes at bedtime.   6   magnesium oxide (MAG-OX) 400 (240 Mg) MG tablet TAKE 0.5 TABLETS (200 MG TOTAL) BY MOUTH DAILY. 45 tablet 2   meclizine (ANTIVERT) 25 MG tablet Take 1 tablet by mouth 3 (three) times daily as needed for dizziness.     Multiple Vitamin (MULTIVITAMIN WITH MINERALS) TABS tablet Take 1 tablet by mouth daily.     NON FORMULARY Apply 1 application topically 2 (two) times daily as needed (for eczema). Traimcinolone/[employer] Moist Cream     oxybutynin (DITROPAN-XL) 10 MG 24 hr  tablet Take 10 mg by mouth daily.     pantoprazole (PROTONIX) 40 MG tablet Take 40 mg by mouth daily as needed (acid reflux).     polyethylene glycol (MIRALAX / GLYCOLAX) 17 g packet Take 17 g by mouth 3 (three) times a week.     potassium chloride (KLOR-CON) 10 MEQ tablet Take 1 tablet (10 mEq total) by mouth daily as needed (when you take Lasix). 30 tablet 3   PRESCRIPTION MEDICATION Inhale into the lungs at bedtime. CPAP     Probiotic Product (PROBIOTIC PO) Take 1 capsule by mouth  daily.     valsartan (DIOVAN) 160 MG tablet Take 1 tablet (160 mg total) by mouth daily. 90 tablet 3   No current facility-administered medications for this encounter.    Allergies  Allergen Reactions   Tizanidine Nausea Only   Amoxicillin Diarrhea    Has patient had a PCN reaction causing immediate rash, facial/tongue/throat swelling, SOB or lightheadedness with hypotension: no Has patient had a PCN reaction causing severe rash involving mucus membranes or skin necrosis: no Has patient had a PCN reaction that required hospitalization: no Has patient had a PCN reaction occurring within the last 10 years: no If all of the above answers are "NO", then may proceed with Cephalosporin use.    Metoprolol Tartrate Other (See Comments)    Mouth sores "mouth tastes like mold"   Oxaprozin Nausea Only    Social History   Socioeconomic History   Marital status: Married    Spouse name: Not on file   Number of children: Not on file   Years of education: Not on file   Highest education level: Not on file  Occupational History   Occupation: Retired  Tobacco Use   Smoking status: Never   Smokeless tobacco: Never   Tobacco comments:    Never smoke 09/01/21  Vaping Use   Vaping Use: Never used  Substance and Sexual Activity   Alcohol use: Yes    Comment: rarely   Drug use: No   Sexual activity: Not Currently  Other Topics Concern   Not on file  Social History Narrative   Lives with husband in [place].    Social Determinants of Health   Financial Resource Strain: Not on file  Food Insecurity: Not on file  Transportation Needs: Not on file  Physical Activity: Not on file  Stress: Not on file  Social Connections: Not on file  Intimate Partner Violence: Not on file    Family History  Problem Relation Age of Onset   Heart attack Mother    Hypertension Brother    Kidney disease Brother    Diabetes Brother    Heart failure Maternal Grandfather     ROS- All systems are reviewed and negative except as per the HPI above  Physical Exam: Vitals:   11/25/21 1404  BP: (!) 144/60  Pulse: (!) 51  Weight: 78.6 kg  Height: 5' (1.524 m)     Wt Readings from Last 3 Encounters:  11/25/21 78.6 kg  11/20/21 76.2 kg  11/12/21 76.1 kg    Labs: Lab Results  Component Value Date   NA 139 11/18/2021   K 4.0 11/18/2021   CL 106 11/18/2021   CO2 23 11/18/2021   GLUCOSE 105 (H) 11/18/2021   BUN 12 11/18/2021   CREATININE 0.82 11/18/2021   CALCIUM 9.0 11/18/2021   MG 2.3 09/01/2021   Lab Results  Component Value Date   INR 2.6 02/23/2019   No results found for: "CHOL", "HDL", "LDLCALC", "TRIG"   GEN- The patient is a well appearing elderly female, alert and oriented x 3 today.   HEENT-head normocephalic, atraumatic, sclera clear, conjunctiva pink, hearing intact, trachea midline. Lungs- Clear to ausculation bilaterally, normal work of breathing Heart- Regular rate and rhythm, bradycardia, no murmurs, rubs or gallops  GI- soft, NT, ND, + BS Extremities- no clubbing, cyanosis, or edema MS- no significant deformity or atrophy Skin- no rash or lesion Psych- euthymic mood, full affect Neuro- strength and sensation are intact   EKG-  SB Vent. rate 51 BPM  PR interval * ms QRS duration 90 ms QT/QTcB 500/460 ms   Epic  records reviewed   Assessment and Plan: 1. Persistent Afib/atrial flutter  S/p afib ablation x 2 2019 S/p Maze/ valve repair 11/2018 Recall, she had  significant GI side effects with amiodarone previously. She would rather not go back to this medication if at all possible. She is scheduled for ablation with Dr Curt Bears on 02/23/22. Also question if she would be a candidate for convergent if her arrhythmias remain refractory.  S/p DCCV 11/20/21 Patient in [place].  Continue Tikosyn 500 mcg BID. QT stable.  Continue Eliquis 5 mg BID Continue carvedilol 3.125 mg BID Continue diltiazem 180 mg AM and 120 mg PM Continue diltiazem 30 mg PRN q 4 hours.  2. CHA2DS2VASc score is 4 Continue Eliquis 5 mg BID  3. HTN Stable, no changes today.  4. OSA Encouraged compliance with CPAP therapy.  5. Mitral stenosis Rheumatic, s/p MVR with bioprosthetic valve.   Follow up in the AF clinic as scheduled and then for afib ablation.    [facility] [address] [place], [place] [address] [phone]

## 2021-11-26 ENCOUNTER — Other Ambulatory Visit (HOSPITAL_COMMUNITY): Payer: Self-pay | Admitting: *Deleted

## 2021-11-26 MED ORDER — DILTIAZEM HCL ER COATED BEADS 180 MG PO CP24: 180.0000 mg | ORAL_CAPSULE | Freq: Every day | ORAL | 1 refills | Status: DC

## 2021-12-02 ENCOUNTER — Other Ambulatory Visit: Payer: Self-pay

## 2021-12-02 MED ORDER — CARVEDILOL 3.125 MG PO TABS: 3.1250 mg | ORAL_TABLET | Freq: Two times a day (BID) | ORAL | 3 refills | Status: DC

## 2021-12-02 NOTE — Telephone Encounter (Signed)
This is a A-Fib pt

## 2021-12-04 ENCOUNTER — Telehealth (HOSPITAL_COMMUNITY): Payer: Self-pay

## 2021-12-04 NOTE — Telephone Encounter (Signed)
Patient called stating that she have been lightheaded and dizzy the last couple days. HR in the 40s. Talked with Audry Pili he stated to stop Diltiazem '120mg'$  and give the clinic a call back on Monday/Tuesday.

## 2021-12-07 ENCOUNTER — Ambulatory Visit (INDEPENDENT_AMBULATORY_CARE_PROVIDER_SITE_OTHER): Payer: Medicare Other

## 2021-12-07 ENCOUNTER — Ambulatory Visit (HOSPITAL_BASED_OUTPATIENT_CLINIC_OR_DEPARTMENT_OTHER): Payer: Medicare Other

## 2021-12-07 DIAGNOSIS — Z953 Presence of xenogenic heart valve: Secondary | ICD-10-CM

## 2021-12-07 DIAGNOSIS — R42 Dizziness and giddiness: Secondary | ICD-10-CM | POA: Diagnosis not present

## 2021-12-07 DIAGNOSIS — I6523 Occlusion and stenosis of bilateral carotid arteries: Secondary | ICD-10-CM

## 2021-12-08 ENCOUNTER — Other Ambulatory Visit (HOSPITAL_BASED_OUTPATIENT_CLINIC_OR_DEPARTMENT_OTHER): Payer: Self-pay

## 2021-12-08 ENCOUNTER — Telehealth (HOSPITAL_COMMUNITY): Payer: Self-pay | Admitting: *Deleted

## 2021-12-08 ENCOUNTER — Other Ambulatory Visit: Payer: Self-pay

## 2021-12-08 DIAGNOSIS — I10 Essential (primary) hypertension: Secondary | ICD-10-CM

## 2021-12-08 DIAGNOSIS — I4819 Other persistent atrial fibrillation: Secondary | ICD-10-CM

## 2021-12-08 DIAGNOSIS — I6523 Occlusion and stenosis of bilateral carotid arteries: Secondary | ICD-10-CM

## 2021-12-08 LAB — ECHOCARDIOGRAM COMPLETE
Area-P 1/2: 3.17 cm2
P 1/2 time: 448 msec
S' Lateral: 2.85 cm

## 2021-12-08 MED ORDER — VALSARTAN 160 MG PO TABS: 160.0000 mg | ORAL_TABLET | Freq: Every day | ORAL | 3 refills | Status: DC

## 2021-12-08 NOTE — Telephone Encounter (Signed)
Patient calling with update of heart rate response to reduction of cardizem since last week.  Heart rates in the 60s and BP 137/54 feeling much improved. Pt will continue on current medications and call if issues arise.

## 2022-01-14 ENCOUNTER — Telehealth (HOSPITAL_BASED_OUTPATIENT_CLINIC_OR_DEPARTMENT_OTHER): Payer: Self-pay | Admitting: Cardiovascular Disease

## 2022-01-14 NOTE — Telephone Encounter (Signed)
Returned call to patient,    She states in the am blood pressures are okay, but then in the afternoon or early evening everything goes up. States she is having to take her PRN medications every evening  825pm 159/69 70- took hydralazine  1045pm 157/60 63; went onto bed and couldn't sleep because heart was racing, got up at 1am and BP was 124/56 66. This morning when she got  at 8am BP was 124/53 70    Carvdiol 8am and 8pm Dilt 180 QD Valsartan 160 qd   Does not take the Dilt '30mg'$  PRN unless she is in A. Fib, she did check her Jodelle Red this morning and she is NSR   Patient wanting to know if she can just increase her medications through the holiday to see if it will help those evening blood pressures come back down.

## 2022-01-14 NOTE — Telephone Encounter (Signed)
Patient calling in to say BP in the morning is fine but early afternoon/evening it starts going up. Would like to speak to the nurse to see what's going on. Please advise

## 2022-01-14 NOTE — Telephone Encounter (Signed)
Returned call to patient and provided the following recommendations, patient verbalizes understanding and says she will continue using they hydralazine.    "If her BP is only getting high in the evenings, would continue using her hydralazine PRN SBP > 140 how it's written. Increasing her once daily meds like valsartan might drop BP low during the morning and daytime when they're well controlled. Ok if she needs to use her hydralazine every evening - each dose lasts for ~6-8 hours and should help target higher evening readings. If she starts noticing that BP is consistently elevated throughout the day, would recommend increasing her valsartan dose (this was decreased at August visit due to fatigue and lower BP readings though)."

## 2022-01-14 NOTE — Telephone Encounter (Signed)
If her BP is only getting high in the evenings, would continue using her hydralazine PRN SBP > 140 how it's written. Increasing her once daily meds like valsartan might drop BP low during the morning and daytime when they're well controlled. Ok if she needs to use her hydralazine every evening - each dose lasts for ~6-8 hours and should help target higher evening readings. If she starts noticing that BP is consistently elevated throughout the day, would recommend increasing her valsartan dose (this was decreased at August visit due to fatigue and lower BP readings though).

## 2022-01-22 ENCOUNTER — Other Ambulatory Visit: Payer: Self-pay

## 2022-01-22 ENCOUNTER — Telehealth: Payer: Self-pay

## 2022-01-22 DIAGNOSIS — I4819 Other persistent atrial fibrillation: Secondary | ICD-10-CM

## 2022-01-22 NOTE — Telephone Encounter (Signed)
Work up complete for Ablation...  Pt will have labs done at AF Clinic appt on 02/10/22.. CT has been ordered... Pt is aware of how to find instruction letters in her MyChart.

## 2022-01-27 ENCOUNTER — Ambulatory Visit (HOSPITAL_COMMUNITY): Payer: Medicare Other | Admitting: Physician Assistant

## 2022-02-07 ENCOUNTER — Other Ambulatory Visit: Payer: Self-pay | Admitting: Physician Assistant

## 2022-02-10 ENCOUNTER — Encounter (HOSPITAL_COMMUNITY): Payer: Self-pay | Admitting: Physician Assistant

## 2022-02-10 ENCOUNTER — Ambulatory Visit (HOSPITAL_COMMUNITY)
Admission: RE | Admit: 2022-02-10 | Discharge: 2022-02-10 | Disposition: A | Discharge status: Home / Self Care | Payer: Medicare Other | Source: Ambulatory Visit | Attending: Physician Assistant | Admitting: Physician Assistant

## 2022-02-10 VITALS — BP 134/90 | HR 50 | Ht 60.0 in | Wt 173.6 lb

## 2022-02-10 DIAGNOSIS — I050 Rheumatic mitral stenosis: Secondary | ICD-10-CM | POA: Diagnosis not present

## 2022-02-10 DIAGNOSIS — Z952 Presence of prosthetic heart valve: Secondary | ICD-10-CM | POA: Insufficient documentation

## 2022-02-10 DIAGNOSIS — D6869 Other thrombophilia: Secondary | ICD-10-CM | POA: Diagnosis not present

## 2022-02-10 DIAGNOSIS — I4892 Unspecified atrial flutter: Secondary | ICD-10-CM | POA: Diagnosis not present

## 2022-02-10 DIAGNOSIS — I4819 Other persistent atrial fibrillation: Secondary | ICD-10-CM | POA: Diagnosis not present

## 2022-02-10 DIAGNOSIS — I10 Essential (primary) hypertension: Secondary | ICD-10-CM | POA: Diagnosis not present

## 2022-02-10 DIAGNOSIS — G4733 Obstructive sleep apnea (adult) (pediatric): Secondary | ICD-10-CM | POA: Diagnosis not present

## 2022-02-10 LAB — CBC
HCT: 41.7 % (ref 36.0–46.0)
Hemoglobin: 13 g/dL (ref 12.0–15.0)
MCH: 29.2 pg (ref 26.0–34.0)
MCHC: 31.2 g/dL (ref 30.0–36.0)
MCV: 93.7 fL (ref 80.0–100.0)
Platelets: 187 10*3/uL (ref 150–400)
RBC: 4.45 MIL/uL (ref 3.87–5.11)
RDW: 15.9 % — ABNORMAL HIGH (ref 11.5–15.5)
WBC: 7.4 10*3/uL (ref 4.0–10.5)
nRBC: 0 % (ref 0.0–0.2)

## 2022-02-10 NOTE — Progress Notes (Signed)
Primary Care Physician: Lawerance Cruel, MD Referring Physician: Dr. Gillian Scarce f/u   Mary Farrell is a 77 y.o. female with a h/o  persistent atrial fibrillation, mitral valve replacement with Maze procedure 11/2018 who was admitted for recurrent atrial arrhythmias 01/22/19 to 01/28/19.  She was admitted for persistent  atrial fibrillation/flutter.    She underwent cardioversion and was started on flecainide therapy.  She had recurrent atrial flutter despite flecainide therapy, and underwent Tikosyn loading while in-house.  She was maintained on dofetilide 500 mcg twice daily.  Her QTC remained around 490-500 ms.  The electrophysiology attending, Jolyn Nap, MD, reviewed her EKG prior to discharge and agreed with the dose of 500 mcg twice daily.   Now in the clinic she is staying in SR at 55 bpm. qtc is stable at 476 ms. No concerns voiced today. She is feeling improved. CHA2DS2VASc of at least 4 and continues on wafarin.   Follow up in the AF clinic 09/01/21. Patient seen 08/26/21 and was found to be in rapid atrial flutter. She was started on extended release diltiazem. She had some dizziness a couple days later associated with low BP and her carvedilol was decreased. She is back in SR today.   Follow up in the AF clinic 11/12/21. Patient reports that she felt well after her DCCV on 10/14/21 for about 3 weeks. Then she started having palpitations and fatigue. Her carvedilol was resumed and her diltiazem was increased. She is in rate controlled atrial flutter today.   Follow up in the AF clinic 11/25/21. Patient called the clinic back to schedule DCCV as she was more symptomatic. She had repeat DCCV on 11/20/21. Patient feels much better in SR with more energy.   Follow up in the AF clinic 02/10/22. She is in SR and feeling well at this time. Patient is scheduled for ablation with Dr Curt Bears on 02/23/22. No bleeding issues on anticoagulation.   Today, she denies symptoms of palpitations, chest  pain, shortness of breath, orthopnea, PND, lower extremity edema, dizziness, presyncope, syncope, or neurologic sequela. The patient is tolerating medications without difficulties and is otherwise without complaint today.   Past Medical History:  Diagnosis Date   Anxiety 06/29/2012   Aortic regurgitation 08/13/2015   Arthritis    "hands, wrists, back, feet, toes" (06/24/2017)   Atrial flutter ([place]) 09/18/2015   Atypical chest pain 05/13/2016   Chest pain    remote cath in 1996 with NORMAL coronaries noted   CHF (congestive heart failure) ([place])    Dyspnea 10/30/2010   Essential hypertension 09/05/2013   Exogenous obesity    history of    GERD (gastroesophageal reflux disease)    Glaucoma, both eyes    Headache, migraine    "stopped in my 77's" (09/18/2015)   History of cardiovascular stress test 2007   showing no ischemia   Hypertension    Mitral stenosis and aortic insufficiency 06/23/2010   Mitral stenosis with insufficiency    Mitral stenosis with regurgitation    Murmur    of mitral stenosis and moderate aortic insufficiency       Nausea 05/01/2018   Obesity (BMI 30.0-34.9) 02/13/2021   OSA on CPAP    Palpitations    occasional   Paroxysmal A-fib ([facility]) 06/24/2017   Pericardial effusion 09/18/2015   Persistent atrial fibrillation ([place]) 03/10/2017   Personal history of rheumatic heart disease    S/P minimally-invasive maze operation for atrial fibrillation 12/13/2018   Complete bilateral atrial lesion set using  cryothermy and bipolar radiofrequency ablation with clipping of LA appendage via right mini-thoracotomy approach   S/P minimally-invasive mitral valve replacement with bioprosthetic valve 12/13/2018   31 mm [facility] Mitral stented bovine pericardial tissue valve   SBE (subacute bacterial endocarditis)    prophylaxis, patient unaware   Sliding hiatal hernia    Stress 02/13/2021   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   Vertigo 05/01/2018   Past Surgical History:   Procedure Laterality Date   [place] N/A 03/10/2017   Procedure: ATRIAL FIBRILLATION ABLATION;  Surgeon: [name], MD;  Location: [place] [facility];  Service: Cardiovascular;  Laterality: N/A;   ATRIAL FIBRILLATION ABLATION  06/24/2017   ATRIAL FIBRILLATION ABLATION N/A 06/24/2017   Procedure: ATRIAL FIBRILLATION ABLATION;  Surgeon: [name], MD;  Location: [place] [facility];  Service: Cardiovascular;  Laterality: N/A;   BUNIONECTOMY Right 2000s   CARDIAC CATHETERIZATION  01/13/1995   normal coronary anatomy and mild mitral stenosis and mild pulmonary hypertension   CARDIOVERSION N/A 09/22/2015   Procedure: CARDIOVERSION;  Surgeon: Jerline Pain, MD;  Location: [place];  Service: Cardiovascular;  Laterality: N/A;   CARDIOVERSION N/A 10/25/2016   Procedure: CARDIOVERSION;  Surgeon: Fay Records, MD;  Location: [place];  Service: Cardiovascular;  Laterality: N/A;   CARDIOVERSION N/A 04/15/2017   Procedure: CARDIOVERSION;  Surgeon: Josue Hector, MD;  Location: [facility] ENDOSCOPY;  Service: Cardiovascular;  Laterality: N/A;   CARDIOVERSION N/A 05/16/2017   Procedure: CARDIOVERSION;  Surgeon: Pixie Casino, MD;  Location[facility] ENDOSCOPY;  Service: Cardiovascular;  Laterality: N/A;   CARDIOVERSION N/A 01/24/2019   Procedure: CARDIOVERSION;  Surgeon: Pixie Casino, MD;  Location: [facility] ENDOSCOPY;  Service: Cardiovascular;  Laterality: N/A;   CARDIOVERSION N/A 10/14/2021   Procedure: CARDIOVERSION;  Surgeon: [name], MD;  Location: [facility] ENDOSCOPY;  Service: Cardiovascular;  Laterality: N/A;   CARDIOVERSION N/A 11/20/2021   Procedure: CARDIOVERSION;  Surgeon: Fay Records, MD;  Location: [facility] ENDOSCOPY;  Service: Cardiovascular;  Laterality: N/A;   CATARACT EXTRACTION W/ INTRAOCULAR LENS  IMPLANT, BILATERAL Bilateral 2013   COLONOSCOPY  2013   EYE SURGERY Bilateral    cataracts    LAPAROSCOPIC CHOLECYSTECTOMY  1997   MINIMALLY INVASIVE MAZE PROCEDURE N/A 12/13/2018   Procedure: MINIMALLY INVASIVE MAZE PROCEDURE using a 45 MM AtriClip.;  Surgeon: Rexene Alberts, MD;  Location: [place];  Service: Open Heart Surgery;  Laterality: N/A;   MITRAL VALVE REPLACEMENT Right 12/13/2018   Procedure: MINIMALLY INVASIVE MITRAL VALVE (MV) REPLACEMENT using a Magna Mitral Ease 31 MM Valve.;  Surgeon: Rexene Alberts, MD;  Location: [place];  Service: Open Heart Surgery;  Laterality: Right;   RIGHT/LEFT HEART CATH AND CORONARY ANGIOGRAPHY N/A 10/18/2018   Procedure: RIGHT/LEFT HEART CATH AND CORONARY ANGIOGRAPHY;  Surgeon: Leonie Man, MD;  Location: [place] [facility];  Service: Cardiovascular;  Laterality: N/A;   TEE WITHOUT CARDIOVERSION N/A 06/23/2017   Procedure: TRANSESOPHAGEAL ECHOCARDIOGRAM (TEE);  Surgeon: Fay Records, MD;  Location: [place];  Service: Cardiovascular;  Laterality: N/A;   TEE WITHOUT CARDIOVERSION N/A 10/17/2018   Procedure: TRANSESOPHAGEAL ECHOCARDIOGRAM (TEE);  Surgeon: Jerline Pain, MD;  Location: [facility] ENDOSCOPY;  Service: Cardiovascular;  Laterality: N/A;   TEE WITHOUT CARDIOVERSION N/A 12/13/2018   Procedure: TRANSESOPHAGEAL ECHOCARDIOGRAM (TEE);  Surgeon: Rexene Alberts, MD;  Location: [place];  Service: Open Heart Surgery;  Laterality: N/A;   TONSILLECTOMY  AND ADENOIDECTOMY  1990s   UVULOPALATOPHARYNGOPLASTY, TONSILLECTOMY AND SEPTOPLASTY  1990s    Current Outpatient Medications  Medication Sig Dispense Refill   acetaminophen (TYLENOL) 500 MG tablet Take 500 mg by mouth daily as needed for moderate pain.     ALPRAZolam (XANAX) 0.25 MG tablet Take 0.125-0.25 mg by mouth See admin instructions. Take 0.25 mg at night, may take 0.125 mg dose as needed for anxiety     atorvastatin (LIPITOR) 10 MG tablet Take 10 mg by mouth at bedtime.     Calcium Citrate-Vitamin D (CALCIUM + D PO) Take 1 tablet by mouth daily.     carvedilol (COREG) 3.125 MG tablet  TAKE 1 TABLET BY MOUTH TWICE  DAILY WITH MEALS 180 tablet 3   cetirizine (ZYRTEC) 10 MG tablet Take 10 mg by mouth at bedtime.      Cholecalciferol (VITAMIN D) 50 MCG (2000 UT) tablet Take 2,000 Units by mouth daily.     conjugated estrogens (PREMARIN) vaginal cream Place 1 Applicatorful vaginally 3 (three) times a week.     cyanocobalamin (VITAMIN B12) 1000 MCG tablet Take 1,000 mcg by mouth daily.     diltiazem (CARDIZEM CD) 180 MG 24 hr capsule Take 1 capsule (180 mg total) by mouth daily. 90 capsule 1   diltiazem (CARDIZEM) 30 MG tablet Take 1 tablet (30 mg total) by mouth 4 (four) times daily as needed (for heart rates greater than 100). 60 tablet 3   dofetilide (TIKOSYN) 500 MCG capsule TAKE 1 CAPSULE BY MOUTH 2 TIMES DAILY. 180 capsule 3   dorzolamide-timolol (COSOPT) 22.3-6.8 MG/ML ophthalmic solution Place 1 drop into both eyes 2 (two) times daily.     ELIQUIS 5 MG TABS tablet TAKE 1 TABLET BY MOUTH TWICE  DAILY 180 tablet 3   fluticasone (FLONASE) 50 MCG/ACT nasal spray Place 1 spray into both nostrils daily.     furosemide (LASIX) 20 MG tablet Take 1 tablet (20 mg total) by mouth daily as needed for fluid. For >3lb/overnight or >5lb/weekly note dose 30 tablet 4   gabapentin (NEURONTIN) 300 MG capsule Take 300 mg by mouth 3 (three) times daily.     hydrALAZINE (APRESOLINE) 25 MG tablet TAKE 1 TABLET DAILY AS NEEDED FOR BLOOD PRESSURE ABOVE 140 30 tablet 5   latanoprost (XALATAN) 0.005 % ophthalmic solution Place 1 drop into both eyes at bedtime.   6   magnesium oxide (MAG-OX) 400 (240 Mg) MG tablet TAKE 0.5 TABLETS (200 MG TOTAL) BY MOUTH DAILY. 45 tablet 2   meclizine (ANTIVERT) 25 MG tablet Take 1 tablet by mouth 3 (three) times daily as needed for dizziness.     Multiple Vitamin (MULTIVITAMIN WITH MINERALS) TABS tablet Take 1 tablet by mouth daily.     NON FORMULARY Apply 1 application topically 2 (two) times daily as needed (for eczema). Traimcinolone/[employer] Moist Cream     oxybutynin  (DITROPAN-XL) 10 MG 24 hr tablet Take 10 mg by mouth daily.     pantoprazole (PROTONIX) 40 MG tablet Take 40 mg by mouth daily as needed (acid reflux).     polyethylene glycol (MIRALAX / GLYCOLAX) 17 g packet Take 17 g by mouth 3 (three) times a week.     potassium chloride (KLOR-CON) 10 MEQ tablet Take 1 tablet (10 mEq total) by mouth daily as needed (when you take Lasix). 30 tablet 3   PRESCRIPTION MEDICATION Inhale into the lungs at bedtime. CPAP     Probiotic Product (PROBIOTIC PO) Take 1 capsule by mouth  daily.     valsartan (DIOVAN) 160 MG tablet Take 1 tablet (160 mg total) by mouth daily. 90 tablet 3   No current facility-administered medications for this encounter.    Allergies  Allergen Reactions   Tizanidine Nausea Only   Amoxicillin Diarrhea    Has patient had a PCN reaction causing immediate rash, facial/tongue/throat swelling, SOB or lightheadedness with hypotension: no Has patient had a PCN reaction causing severe rash involving mucus membranes or skin necrosis: no Has patient had a PCN reaction that required hospitalization: no Has patient had a PCN reaction occurring within the last 10 years: no If all of the above answers are "NO", then may proceed with Cephalosporin use.    Metoprolol Tartrate Other (See Comments)    Mouth sores "mouth tastes like mold"   Oxaprozin Nausea Only    Social History   Socioeconomic History   Marital status: Married    Spouse name: Not on file   Number of children: Not on file   Years of education: Not on file   Highest education level: Not on file  Occupational History   Occupation: Retired  Tobacco Use   Smoking status: Never   Smokeless tobacco: Never   Tobacco comments:    Never smoke 09/01/21  Vaping Use   Vaping Use: Never used  Substance and Sexual Activity   Alcohol use: Yes    Comment: rarely   Drug use: No   Sexual activity: Not Currently  Other Topics Concern   Not on file  Social History Narrative   Lives  with husband in [place].   Social Determinants of Health   Financial Resource Strain: Not on file  Food Insecurity: Not on file  Transportation Needs: Not on file  Physical Activity: Not on file  Stress: Not on file  Social Connections: Not on file  Intimate Partner Violence: Not on file    Family History  Problem Relation Age of Onset   Heart attack Mother    Hypertension Brother    Kidney disease Brother    Diabetes Brother    Heart failure Maternal Grandfather     ROS- All systems are reviewed and negative except as per the HPI above  Physical Exam: Vitals:   02/10/22 1353  BP: (!) 134/90  Pulse: (!) 50  Weight: 78.7 kg  Height: 5' (1.524 m)    Wt Readings from Last 3 Encounters:  02/10/22 78.7 kg  11/25/21 78.6 kg  11/20/21 76.2 kg    Labs: Lab Results  Component Value Date   NA 139 11/18/2021   K 4.0 11/18/2021   CL 106 11/18/2021   CO2 23 11/18/2021   GLUCOSE 105 (H) 11/18/2021   BUN 12 11/18/2021   CREATININE 0.82 11/18/2021   CALCIUM 9.0 11/18/2021   MG 2.3 09/01/2021   Lab Results  Component Value Date   INR 2.6 02/23/2019   No results found for: "CHOL", "HDL", "LDLCALC", "TRIG"   GEN- The patient is a well appearing elderly female, alert and oriented x 3 today.   HEENT-head normocephalic, atraumatic, sclera clear, conjunctiva pink, hearing intact, trachea midline. Lungs- Clear to ausculation bilaterally, normal work of breathing Heart- Regular rate and rhythm, bradycardia, no murmurs, rubs or gallops  GI- soft, NT, ND, + BS Extremities- no clubbing, cyanosis, or edema MS- no significant deformity or atrophy Skin- no rash or lesion Psych- euthymic mood, full affect Neuro- strength and sensation are intact   EKG-  SB, 1st degree AV block Vent.  rate 50 BPM PR interval 270 ms QRS duration 94 ms QT/QTcB 496/452 ms   Echo 12/07/21  1. Left ventricular ejection fraction, by estimation, is 55 to 60%. Left  ventricular ejection  fraction by 3D volume is 55 %. The left ventricle has  normal function. The left ventricle has no regional wall motion  abnormalities. Left ventricular diastolic function could not be evaluated. Elevated left ventricular end-diastolic pressure. The average left ventricular global longitudinal strain is -16.6 %. The global longitudinal strain is normal.   2. Right ventricular systolic function is normal. The right ventricular  size is normal. There is normal pulmonary artery systolic pressure. The  estimated right ventricular systolic pressure is 01.0 mmHg.   3. Left atrial size was mildly dilated.   4. The mitral valve has been repaired/replaced. No evidence of mitral  valve regurgitation. No evidence of mitral stenosis. The mean mitral valve  gradient is 3.0 mmHg at a HR of 52bpm. Echo findings are consistent with  normal structure and function of  the mitral valve prosthesis.   5. The aortic valve is tricuspid. There is moderate calcification of the  aortic valve. There is moderate thickening of the aortic valve. Aortic  valve regurgitation is mild. Aortic valve sclerosis/calcification is  present, without any evidence of aortic  stenosis. Aortic regurgitation PHT measures 448 msec.   6. Pulmonic valve regurgitation is moderate.   7. The inferior vena cava is normal in size with greater than 50%  respiratory variability, suggesting right atrial pressure of 3 mmHg.     Epic  records reviewed   Assessment and Plan: 1. Persistent Afib/atrial flutter  S/p afib ablation x 2 2019 S/p Maze/ valve repair 11/2018 Recall, she had significant GI side effects with amiodarone previously.  She is scheduled for ablation with Dr Curt Bears on 02/23/22, CT scan 02/16/22. Continue Tikosyn 500 mcg BID. QT stable.  Continue Eliquis 5 mg BID Continue carvedilol 3.125 mg BID Continue diltiazem 180 mg AM and 120 mg PM Continue diltiazem 30 mg PRN q 4 hours.  2. CHA2DS2VASc score is 4 Continue Eliquis 5  mg BID  3. HTN Stable, no changes today.  4. OSA Encouraged compliance with CPAP therapy.  5. Mitral stenosis Rheumatic, s/p MVR with bioprosthetic valve.   Follow up for afib ablation as scheduled.    [facility] [address] [place], [place] [address] [phone]

## 2022-02-10 NOTE — H&P (View-Only) (Signed)
Primary Care Physician: Lawerance Cruel, MD Referring Physician: Dr. Gillian Scarce f/u   Mary Farrell is a 77 y.o. female with a h/o  persistent atrial fibrillation, mitral valve replacement with Maze procedure 11/2018 who was admitted for recurrent atrial arrhythmias 01/22/19 to 01/28/19.  She was admitted for persistent  atrial fibrillation/flutter.    She underwent cardioversion and was started on flecainide therapy.  She had recurrent atrial flutter despite flecainide therapy, and underwent Tikosyn loading while in-house.  She was maintained on dofetilide 500 mcg twice daily.  Her QTC remained around 490-500 ms.  The electrophysiology attending, Jolyn Nap, MD, reviewed her EKG prior to discharge and agreed with the dose of 500 mcg twice daily.   Now in the clinic she is staying in SR at 55 bpm. qtc is stable at 476 ms. No concerns voiced today. She is feeling improved. CHA2DS2VASc of at least 4 and continues on wafarin.   Follow up in the AF clinic 09/01/21. Patient seen 08/26/21 and was found to be in rapid atrial flutter. She was started on extended release diltiazem. She had some dizziness a couple days later associated with low BP and her carvedilol was decreased. She is back in SR today.   Follow up in the AF clinic 11/12/21. Patient reports that she felt well after her DCCV on 10/14/21 for about 3 weeks. Then she started having palpitations and fatigue. Her carvedilol was resumed and her diltiazem was increased. She is in rate controlled atrial flutter today.   Follow up in the AF clinic 11/25/21. Patient called the clinic back to schedule DCCV as she was more symptomatic. She had repeat DCCV on 11/20/21. Patient feels much better in SR with more energy.   Follow up in the AF clinic 02/10/22. She is in SR and feeling well at this time. Patient is scheduled for ablation with Dr Curt Bears on 02/23/22. No bleeding issues on anticoagulation.   Today, she denies symptoms of palpitations, chest  pain, shortness of breath, orthopnea, PND, lower extremity edema, dizziness, presyncope, syncope, or neurologic sequela. The patient is tolerating medications without difficulties and is otherwise without complaint today.   Past Medical History:  Diagnosis Date   Anxiety 06/29/2012   Aortic regurgitation 08/13/2015   Arthritis    "hands, wrists, back, feet, toes" (06/24/2017)   Atrial flutter ([place]) 09/18/2015   Atypical chest pain 05/13/2016   Chest pain    remote cath in 1996 with NORMAL coronaries noted   CHF (congestive heart failure) ([place])    Dyspnea 10/30/2010   Essential hypertension 09/05/2013   Exogenous obesity    history of    GERD (gastroesophageal reflux disease)    Glaucoma, both eyes    Headache, migraine    "stopped in my 12's" (09/18/2015)   History of cardiovascular stress test 2007   showing no ischemia   Hypertension    Mitral stenosis and aortic insufficiency 06/23/2010   Mitral stenosis with insufficiency    Mitral stenosis with regurgitation    Murmur    of mitral stenosis and moderate aortic insufficiency       Nausea 05/01/2018   Obesity (BMI 30.0-34.9) 02/13/2021   OSA on CPAP    Palpitations    occasional   Paroxysmal A-fib (HCC) 06/24/2017   Pericardial effusion 09/18/2015   Persistent atrial fibrillation ([place]) 03/10/2017   Personal history of rheumatic heart disease    S/P minimally-invasive maze operation for atrial fibrillation 12/13/2018   Complete bilateral atrial lesion set using  cryothermy and bipolar radiofrequency ablation with clipping of LA appendage via right mini-thoracotomy approach   S/P minimally-invasive mitral valve replacement with bioprosthetic valve 12/13/2018   31 mm [facility] Mitral stented bovine pericardial tissue valve   SBE (subacute bacterial endocarditis)    prophylaxis, patient unaware   Sliding hiatal hernia    Stress 02/13/2021   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   Vertigo 05/01/2018   Past Surgical History:   Procedure Laterality Date   [place] N/A 03/10/2017   Procedure: ATRIAL FIBRILLATION ABLATION;  Surgeon: [name], MD;  Location: [place] [facility];  Service: Cardiovascular;  Laterality: N/A;   ATRIAL FIBRILLATION ABLATION  06/24/2017   ATRIAL FIBRILLATION ABLATION N/A 06/24/2017   Procedure: ATRIAL FIBRILLATION ABLATION;  Surgeon: [name], MD;  Location: [place] [facility];  Service: Cardiovascular;  Laterality: N/A;   BUNIONECTOMY Right 2000s   CARDIAC CATHETERIZATION  01/13/1995   normal coronary anatomy and mild mitral stenosis and mild pulmonary hypertension   CARDIOVERSION N/A 09/22/2015   Procedure: CARDIOVERSION;  Surgeon: Jerline Pain, MD;  Location: [place];  Service: Cardiovascular;  Laterality: N/A;   CARDIOVERSION N/A 10/25/2016   Procedure: CARDIOVERSION;  Surgeon: Fay Records, MD;  Location: [place];  Service: Cardiovascular;  Laterality: N/A;   CARDIOVERSION N/A 04/15/2017   Procedure: CARDIOVERSION;  Surgeon: Josue Hector, MD;  Location: [facility] ENDOSCOPY;  Service: Cardiovascular;  Laterality: N/A;   CARDIOVERSION N/A 05/16/2017   Procedure: CARDIOVERSION;  Surgeon: Pixie Casino, MD;  Location: [facility] ENDOSCOPY;  Service: Cardiovascular;  Laterality: N/A;   CARDIOVERSION N/A 01/24/2019   Procedure: CARDIOVERSION;  Surgeon: Pixie Casino, MD;  Location: [facility] ENDOSCOPY;  Service: Cardiovascular;  Laterality: N/A;   CARDIOVERSION N/A 10/14/2021   Procedure: CARDIOVERSION;  Surgeon: [name], MD;  Location: [facility] ENDOSCOPY;  Service: Cardiovascular;  Laterality: N/A;   CARDIOVERSION N/A 11/20/2021   Procedure: CARDIOVERSION;  Surgeon: Fay Records, MD;  Location: [facility] ENDOSCOPY;  Service: Cardiovascular;  Laterality: N/A;   CATARACT EXTRACTION W/ INTRAOCULAR LENS  IMPLANT, BILATERAL Bilateral 2013   COLONOSCOPY  2013   EYE SURGERY Bilateral    cataracts    LAPAROSCOPIC CHOLECYSTECTOMY  1997   MINIMALLY INVASIVE MAZE PROCEDURE N/A 12/13/2018   Procedure: MINIMALLY INVASIVE MAZE PROCEDURE using a 45 MM AtriClip.;  Surgeon: Rexene Alberts, MD;  Location: [place];  Service: Open Heart Surgery;  Laterality: N/A;   MITRAL VALVE REPLACEMENT Right 12/13/2018   Procedure: MINIMALLY INVASIVE MITRAL VALVE (MV) REPLACEMENT using a Magna Mitral Ease 31 MM Valve.;  Surgeon: Rexene Alberts, MD;  Location: [place];  Service: Open Heart Surgery;  Laterality: Right;   RIGHT/LEFT HEART CATH AND CORONARY ANGIOGRAPHY N/A 10/18/2018   Procedure: RIGHT/LEFT HEART CATH AND CORONARY ANGIOGRAPHY;  Surgeon: Leonie Man, MD;  Location: [place] CV LAB;  Service: Cardiovascular;  Laterality: N/A;   TEE WITHOUT CARDIOVERSION N/A 06/23/2017   Procedure: TRANSESOPHAGEAL ECHOCARDIOGRAM (TEE);  Surgeon: Fay Records, MD;  Location: [place];  Service: Cardiovascular;  Laterality: N/A;   TEE WITHOUT CARDIOVERSION N/A 10/17/2018   Procedure: TRANSESOPHAGEAL ECHOCARDIOGRAM (TEE);  Surgeon: Jerline Pain, MD;  Location: [facility] ENDOSCOPY;  Service: Cardiovascular;  Laterality: N/A;   TEE WITHOUT CARDIOVERSION N/A 12/13/2018   Procedure: TRANSESOPHAGEAL ECHOCARDIOGRAM (TEE);  Surgeon: Rexene Alberts, MD;  Location: [place];  Service: Open Heart Surgery;  Laterality: N/A;   TONSILLECTOMY  AND ADENOIDECTOMY  1990s   UVULOPALATOPHARYNGOPLASTY, TONSILLECTOMY AND SEPTOPLASTY  1990s    Current Outpatient Medications  Medication Sig Dispense Refill   acetaminophen (TYLENOL) 500 MG tablet Take 500 mg by mouth daily as needed for moderate pain.     ALPRAZolam (XANAX) 0.25 MG tablet Take 0.125-0.25 mg by mouth See admin instructions. Take 0.25 mg at night, may take 0.125 mg dose as needed for anxiety     atorvastatin (LIPITOR) 10 MG tablet Take 10 mg by mouth at bedtime.     Calcium Citrate-Vitamin D (CALCIUM + D PO) Take 1 tablet by mouth daily.     carvedilol (COREG) 3.125 MG tablet  TAKE 1 TABLET BY MOUTH TWICE  DAILY WITH MEALS 180 tablet 3   cetirizine (ZYRTEC) 10 MG tablet Take 10 mg by mouth at bedtime.      Cholecalciferol (VITAMIN D) 50 MCG (2000 UT) tablet Take 2,000 Units by mouth daily.     conjugated estrogens (PREMARIN) vaginal cream Place 1 Applicatorful vaginally 3 (three) times a week.     cyanocobalamin (VITAMIN B12) 1000 MCG tablet Take 1,000 mcg by mouth daily.     diltiazem (CARDIZEM CD) 180 MG 24 hr capsule Take 1 capsule (180 mg total) by mouth daily. 90 capsule 1   diltiazem (CARDIZEM) 30 MG tablet Take 1 tablet (30 mg total) by mouth 4 (four) times daily as needed (for heart rates greater than 100). 60 tablet 3   dofetilide (TIKOSYN) 500 MCG capsule TAKE 1 CAPSULE BY MOUTH 2 TIMES DAILY. 180 capsule 3   dorzolamide-timolol (COSOPT) 22.3-6.8 MG/ML ophthalmic solution Place 1 drop into both eyes 2 (two) times daily.     ELIQUIS 5 MG TABS tablet TAKE 1 TABLET BY MOUTH TWICE  DAILY 180 tablet 3   fluticasone (FLONASE) 50 MCG/ACT nasal spray Place 1 spray into both nostrils daily.     furosemide (LASIX) 20 MG tablet Take 1 tablet (20 mg total) by mouth daily as needed for fluid. For >3lb/overnight or >5lb/weekly note dose 30 tablet 4   gabapentin (NEURONTIN) 300 MG capsule Take 300 mg by mouth 3 (three) times daily.     hydrALAZINE (APRESOLINE) 25 MG tablet TAKE 1 TABLET DAILY AS NEEDED FOR BLOOD PRESSURE ABOVE 140 30 tablet 5   latanoprost (XALATAN) 0.005 % ophthalmic solution Place 1 drop into both eyes at bedtime.   6   magnesium oxide (MAG-OX) 400 (240 Mg) MG tablet TAKE 0.5 TABLETS (200 MG TOTAL) BY MOUTH DAILY. 45 tablet 2   meclizine (ANTIVERT) 25 MG tablet Take 1 tablet by mouth 3 (three) times daily as needed for dizziness.     Multiple Vitamin (MULTIVITAMIN WITH MINERALS) TABS tablet Take 1 tablet by mouth daily.     NON FORMULARY Apply 1 application topically 2 (two) times daily as needed (for eczema). Traimcinolone/[employer] Moist Cream     oxybutynin  (DITROPAN-XL) 10 MG 24 hr tablet Take 10 mg by mouth daily.     pantoprazole (PROTONIX) 40 MG tablet Take 40 mg by mouth daily as needed (acid reflux).     polyethylene glycol (MIRALAX / GLYCOLAX) 17 g packet Take 17 g by mouth 3 (three) times a week.     potassium chloride (KLOR-CON) 10 MEQ tablet Take 1 tablet (10 mEq total) by mouth daily as needed (when you take Lasix). 30 tablet 3   PRESCRIPTION MEDICATION Inhale into the lungs at bedtime. CPAP     Probiotic Product (PROBIOTIC PO) Take 1 capsule by mouth  daily.     valsartan (DIOVAN) 160 MG tablet Take 1 tablet (160 mg total) by mouth daily. 90 tablet 3   No current facility-administered medications for this encounter.    Allergies  Allergen Reactions   Tizanidine Nausea Only   Amoxicillin Diarrhea    Has patient had a PCN reaction causing immediate rash, facial/tongue/throat swelling, SOB or lightheadedness with hypotension: no Has patient had a PCN reaction causing severe rash involving mucus membranes or skin necrosis: no Has patient had a PCN reaction that required hospitalization: no Has patient had a PCN reaction occurring within the last 10 years: no If all of the above answers are "NO", then may proceed with Cephalosporin use.    Metoprolol Tartrate Other (See Comments)    Mouth sores "mouth tastes like mold"   Oxaprozin Nausea Only    Social History   Socioeconomic History   Marital status: Married    Spouse name: Not on file   Number of children: Not on file   Years of education: Not on file   Highest education level: Not on file  Occupational History   Occupation: Retired  Tobacco Use   Smoking status: Never   Smokeless tobacco: Never   Tobacco comments:    Never smoke 09/01/21  Vaping Use   Vaping Use: Never used  Substance and Sexual Activity   Alcohol use: Yes    Comment: rarely   Drug use: No   Sexual activity: Not Currently  Other Topics Concern   Not on file  Social History Narrative   Lives  with husband in [place].   Social Determinants of Health   Financial Resource Strain: Not on file  Food Insecurity: Not on file  Transportation Needs: Not on file  Physical Activity: Not on file  Stress: Not on file  Social Connections: Not on file  Intimate Partner Violence: Not on file    Family History  Problem Relation Age of Onset   Heart attack Mother    Hypertension Brother    Kidney disease Brother    Diabetes Brother    Heart failure Maternal Grandfather     ROS- All systems are reviewed and negative except as per the HPI above  Physical Exam: Vitals:   02/10/22 1353  BP: (!) 134/90  Pulse: (!) 50  Weight: 78.7 kg  Height: 5' (1.524 m)    Wt Readings from Last 3 Encounters:  02/10/22 78.7 kg  11/25/21 78.6 kg  11/20/21 76.2 kg    Labs: Lab Results  Component Value Date   NA 139 11/18/2021   K 4.0 11/18/2021   CL 106 11/18/2021   CO2 23 11/18/2021   GLUCOSE 105 (H) 11/18/2021   BUN 12 11/18/2021   CREATININE 0.82 11/18/2021   CALCIUM 9.0 11/18/2021   MG 2.3 09/01/2021   Lab Results  Component Value Date   INR 2.6 02/23/2019   No results found for: "CHOL", "HDL", "LDLCALC", "TRIG"   GEN- The patient is a well appearing elderly female, alert and oriented x 3 today.   HEENT-head normocephalic, atraumatic, sclera clear, conjunctiva pink, hearing intact, trachea midline. Lungs- Clear to ausculation bilaterally, normal work of breathing Heart- Regular rate and rhythm, bradycardia, no murmurs, rubs or gallops  GI- soft, NT, ND, + BS Extremities- no clubbing, cyanosis, or edema MS- no significant deformity or atrophy Skin- no rash or lesion Psych- euthymic mood, full affect Neuro- strength and sensation are intact   EKG-  SB, 1st degree AV block Vent.  rate 50 BPM PR interval 270 ms QRS duration 94 ms QT/QTcB 496/452 ms   Echo 12/07/21  1. Left ventricular ejection fraction, by estimation, is 55 to 60%. Left  ventricular ejection  fraction by 3D volume is 55 %. The left ventricle has  normal function. The left ventricle has no regional wall motion  abnormalities. Left ventricular diastolic function could not be evaluated. Elevated left ventricular end-diastolic pressure. The average left ventricular global longitudinal strain is -16.6 %. The global longitudinal strain is normal.   2. Right ventricular systolic function is normal. The right ventricular  size is normal. There is normal pulmonary artery systolic pressure. The  estimated right ventricular systolic pressure is 73.5 mmHg.   3. Left atrial size was mildly dilated.   4. The mitral valve has been repaired/replaced. No evidence of mitral  valve regurgitation. No evidence of mitral stenosis. The mean mitral valve  gradient is 3.0 mmHg at a HR of 52bpm. Echo findings are consistent with  normal structure and function of  the mitral valve prosthesis.   5. The aortic valve is tricuspid. There is moderate calcification of the  aortic valve. There is moderate thickening of the aortic valve. Aortic  valve regurgitation is mild. Aortic valve sclerosis/calcification is  present, without any evidence of aortic  stenosis. Aortic regurgitation PHT measures 448 msec.   6. Pulmonic valve regurgitation is moderate.   7. The inferior vena cava is normal in size with greater than 50%  respiratory variability, suggesting right atrial pressure of 3 mmHg.     Epic  records reviewed   Assessment and Plan: 1. Persistent Afib/atrial flutter  S/p afib ablation x 2 2019 S/p Maze/ valve repair 11/2018 Recall, she had significant GI side effects with amiodarone previously.  She is scheduled for ablation with Dr Curt Bears on 02/23/22, CT scan 02/16/22. Continue Tikosyn 500 mcg BID. QT stable.  Continue Eliquis 5 mg BID Continue carvedilol 3.125 mg BID Continue diltiazem 180 mg AM and 120 mg PM Continue diltiazem 30 mg PRN q 4 hours.  2. CHA2DS2VASc score is 4 Continue Eliquis 5  mg BID  3. HTN Stable, no changes today.  4. OSA Encouraged compliance with CPAP therapy.  5. Mitral stenosis Rheumatic, s/p MVR with bioprosthetic valve.   Follow up for afib ablation as scheduled.    [facility] [address] [place], [place] [address] [phone]

## 2022-02-15 ENCOUNTER — Ambulatory Visit (HOSPITAL_COMMUNITY)
Admission: RE | Admit: 2022-02-15 | Discharge: 2022-02-15 | Disposition: A | Discharge status: Home / Self Care | Payer: Medicare Other | Source: Ambulatory Visit | Attending: Physician Assistant | Admitting: Physician Assistant

## 2022-02-15 ENCOUNTER — Telehealth (HOSPITAL_COMMUNITY): Payer: Self-pay | Admitting: Emergency Medicine

## 2022-02-15 DIAGNOSIS — Z953 Presence of xenogenic heart valve: Secondary | ICD-10-CM | POA: Insufficient documentation

## 2022-02-15 DIAGNOSIS — I4892 Unspecified atrial flutter: Secondary | ICD-10-CM

## 2022-02-15 DIAGNOSIS — I4819 Other persistent atrial fibrillation: Secondary | ICD-10-CM | POA: Insufficient documentation

## 2022-02-15 LAB — BASIC METABOLIC PANEL
Anion gap: 8 (ref 5–15)
BUN: 14 mg/dL (ref 8–23)
CO2: 28 mmol/L (ref 22–32)
Calcium: 9.2 mg/dL (ref 8.9–10.3)
Chloride: 101 mmol/L (ref 98–111)
Creatinine, Ser: 0.89 mg/dL (ref 0.44–1.00)
GFR, Estimated: >60 mL/min (ref >60)
Glucose, Bld: 98 mg/dL (ref 70–99)
Potassium: 4.4 mmol/L (ref 3.5–5.1)
Sodium: 137 mmol/L (ref 135–145)

## 2022-02-15 NOTE — Telephone Encounter (Signed)
Reaching out to patient to offer assistance regarding upcoming cardiac imaging study; pt verbalizes understanding of appt date/time, parking situation and where to check in, pre-test NPO status and medications ordered, and verified current allergies; name and call back number provided for further questions should they arise Mary Bond RN Navigator Cardiac Imaging [facility] [phone] office [phone] cell  Arrival 100 WC entrance Denies iv issues Daily meds , holding lasix

## 2022-02-16 ENCOUNTER — Ambulatory Visit (HOSPITAL_BASED_OUTPATIENT_CLINIC_OR_DEPARTMENT_OTHER)
Admission: RE | Admit: 2022-02-16 | Discharge: 2022-02-16 | Disposition: A | Discharge status: Home / Self Care | Payer: Medicare Other | Source: Ambulatory Visit | Attending: Cardiology | Admitting: Cardiology

## 2022-02-16 DIAGNOSIS — I4819 Other persistent atrial fibrillation: Secondary | ICD-10-CM | POA: Diagnosis not present

## 2022-02-16 MED ORDER — IOHEXOL 350 MG/ML SOLN
100.0000 mL | Freq: Once | INTRAVENOUS | Status: AC | PRN
  Administered 2022-02-16: 100 mL via INTRAVENOUS

## 2022-02-22 NOTE — Anesthesia Preprocedure Evaluation (Signed)
Anesthesia Evaluation  Patient identified by MRN, date of birth, ID band Patient awake    Reviewed: Allergy & Precautions, NPO status , Patient's Chart, lab work & pertinent test results  Airway Mallampati: II  TM Distance: >3 FB Neck ROM: Full    Dental  (+) Partial Upper   Pulmonary sleep apnea and Continuous Positive Airway Pressure Ventilation    Pulmonary exam normal        Cardiovascular hypertension, Pt. on medications and Pt. on home beta blockers +CHF  + dysrhythmias Atrial Fibrillation + Valvular Problems/Murmurs AI  Rhythm:Irregular Rate:Normal  ECHO 11/23;   1. Left ventricular ejection fraction, by estimation, is 55 to 60%. Left  ventricular ejection fraction by 3D volume is 55 %. The left ventricle has  normal function. The left ventricle has no regional wall motion  abnormalities. Left ventricular diastolic   function could not be evaluated. Elevated left ventricular end-diastolic  pressure. The average left ventricular global longitudinal strain is -16.6  %. The global longitudinal strain is normal.   2. Right ventricular systolic function is normal. The right ventricular  size is normal. There is normal pulmonary artery systolic pressure. The  estimated right ventricular systolic pressure is 09.3 mmHg.   3. Left atrial size was mildly dilated.   4. The mitral valve has been repaired/replaced. No evidence of mitral  valve regurgitation. No evidence of mitral stenosis. The mean mitral valve  gradient is 3.0 mmHg at a HR of 52bpm. Echo findings are consistent with  normal structure and function of  the mitral valve prosthesis.   5. The aortic valve is tricuspid. There is moderate calcification of the  aortic valve. There is moderate thickening of the aortic valve. Aortic  valve regurgitation is mild. Aortic valve sclerosis/calcification is  present, without any evidence of aortic  stenosis. Aortic regurgitation  PHT measures 448 msec.   6. Pulmonic valve regurgitation is moderate.   7. The inferior vena cava is normal in size with greater than 50%  respiratory variability, suggesting right atrial pressure of 3 mmHg.     Neuro/Psych  Headaches  Anxiety        GI/Hepatic Neg liver ROS, hiatal hernia,GERD  ,,  Endo/Other  negative endocrine ROS    Renal/GU negative Renal ROS     Musculoskeletal  (+) Arthritis , Osteoarthritis,    Abdominal Normal abdominal exam  (+)   Peds  Hematology negative hematology ROS (+)   Anesthesia Other Findings   Reproductive/Obstetrics                             Anesthesia Physical Anesthesia Plan  ASA: 3  Anesthesia Plan: General   Post-op Pain Management:    Induction: Intravenous  PONV Risk Score and Plan: 3 and Ondansetron, Dexamethasone and Treatment may vary due to age or medical condition  Airway Management Planned: Mask and Oral ETT  Additional Equipment: None  Intra-op Plan:   Post-operative Plan: Extubation in OR  Informed Consent: I have reviewed the patients History and Physical, chart, labs and discussed the procedure including the risks, benefits and alternatives for the proposed anesthesia with the patient or authorized representative who has indicated his/her understanding and acceptance.     Dental advisory given  Plan Discussed with: CRNA  Anesthesia Plan Comments: (Lab Results      Component                Value  Date                      WBC                      7.4                 02/10/2022                HGB                      13.0                02/10/2022                HCT                      41.7                02/10/2022                MCV                      93.7                02/10/2022                PLT                      187                 02/10/2022             Lab Results      Component                Value               Date                      NA                        137                 02/15/2022                K                        4.4                 02/15/2022                CO2                      28                  02/15/2022                GLUCOSE                  98                  02/15/2022                BUN                      14  02/15/2022                CREATININE               0.89                02/15/2022                CALCIUM                  9.2                 02/15/2022                EGFR                     67                  03/30/2021                GFRNONAA                 >60                 02/15/2022           )       Anesthesia Quick Evaluation

## 2022-02-22 NOTE — Pre-Procedure Instructions (Signed)
Instructed patient on the following items: Arrival time 0530 Nothing to eat or drink after midnight No meds AM of procedure Responsible person to drive you home and stay with you for 24 hrs  Have you missed any doses of anti-coagulant Eliquis- hasn't missed any doses    

## 2022-02-23 ENCOUNTER — Encounter (HOSPITAL_COMMUNITY)
Admission: RE | Disposition: A | Discharge status: Home / Self Care | Payer: Self-pay | Source: Home / Self Care | Attending: Cardiology

## 2022-02-23 ENCOUNTER — Ambulatory Visit (HOSPITAL_COMMUNITY): Payer: Medicare Other | Admitting: Certified Registered"

## 2022-02-23 ENCOUNTER — Ambulatory Visit (HOSPITAL_COMMUNITY)
Admission: RE | Admit: 2022-02-23 | Discharge: 2022-02-23 | Disposition: A | Discharge status: Home / Self Care | Payer: Medicare Other | Attending: Cardiology | Admitting: Cardiology

## 2022-02-23 ENCOUNTER — Ambulatory Visit (HOSPITAL_BASED_OUTPATIENT_CLINIC_OR_DEPARTMENT_OTHER): Payer: Medicare Other | Admitting: Certified Registered"

## 2022-02-23 DIAGNOSIS — I509 Heart failure, unspecified: Secondary | ICD-10-CM | POA: Diagnosis not present

## 2022-02-23 DIAGNOSIS — Z79899 Other long term (current) drug therapy: Secondary | ICD-10-CM | POA: Diagnosis not present

## 2022-02-23 DIAGNOSIS — I4892 Unspecified atrial flutter: Secondary | ICD-10-CM | POA: Diagnosis not present

## 2022-02-23 DIAGNOSIS — G4733 Obstructive sleep apnea (adult) (pediatric): Secondary | ICD-10-CM | POA: Insufficient documentation

## 2022-02-23 DIAGNOSIS — I370 Nonrheumatic pulmonary valve stenosis: Secondary | ICD-10-CM

## 2022-02-23 DIAGNOSIS — Z953 Presence of xenogenic heart valve: Secondary | ICD-10-CM | POA: Insufficient documentation

## 2022-02-23 DIAGNOSIS — I110 Hypertensive heart disease with heart failure: Secondary | ICD-10-CM

## 2022-02-23 DIAGNOSIS — I10 Essential (primary) hypertension: Secondary | ICD-10-CM | POA: Diagnosis not present

## 2022-02-23 DIAGNOSIS — Z7901 Long term (current) use of anticoagulants: Secondary | ICD-10-CM | POA: Insufficient documentation

## 2022-02-23 DIAGNOSIS — I4819 Other persistent atrial fibrillation: Secondary | ICD-10-CM

## 2022-02-23 DIAGNOSIS — I351 Nonrheumatic aortic (valve) insufficiency: Secondary | ICD-10-CM | POA: Diagnosis not present

## 2022-02-23 DIAGNOSIS — F419 Anxiety disorder, unspecified: Secondary | ICD-10-CM

## 2022-02-23 DIAGNOSIS — I4891 Unspecified atrial fibrillation: Secondary | ICD-10-CM

## 2022-02-23 DIAGNOSIS — I050 Rheumatic mitral stenosis: Secondary | ICD-10-CM | POA: Diagnosis not present

## 2022-02-23 HISTORY — PX: ATRIAL FIBRILLATION ABLATION: EP1191

## 2022-02-23 LAB — POCT ACTIVATED CLOTTING TIME: Activated Clotting Time: 325 seconds

## 2022-02-23 SURGERY — ATRIAL FIBRILLATION ABLATION
Anesthesia: General

## 2022-02-23 MED ORDER — PROPOFOL 10 MG/ML IV BOLUS
INTRAVENOUS | Status: DC | PRN
  Administered 2022-02-23: 110 mg via INTRAVENOUS

## 2022-02-23 MED ORDER — SUGAMMADEX SODIUM 200 MG/2ML IV SOLN
INTRAVENOUS | Status: DC | PRN
  Administered 2022-02-23: 200 mg via INTRAVENOUS

## 2022-02-23 MED ORDER — DOBUTAMINE INFUSION FOR EP/ECHO/NUC (1000 MCG/ML)
INTRAVENOUS | Status: DC | PRN
  Administered 2022-02-23: 20 ug/kg/min via INTRAVENOUS

## 2022-02-23 MED ORDER — HEPARIN SODIUM (PORCINE) 1000 UNIT/ML IJ SOLN
INTRAMUSCULAR | Status: DC | PRN
  Administered 2022-02-23: 1000 [IU] via INTRAVENOUS

## 2022-02-23 MED ORDER — SODIUM CHLORIDE 0.9% FLUSH: 3.0000 mL | INTRAVENOUS | Status: DC | PRN

## 2022-02-23 MED ORDER — SODIUM CHLORIDE 0.9 % IV SOLN: 250.0000 mL | INTRAVENOUS | Status: DC | PRN

## 2022-02-23 MED ORDER — ONDANSETRON HCL 4 MG/2ML IJ SOLN: 4.0000 mg | Freq: Four times a day (QID) | INTRAMUSCULAR | Status: DC | PRN

## 2022-02-23 MED ORDER — ONDANSETRON HCL 4 MG/2ML IJ SOLN
INTRAMUSCULAR | Status: DC | PRN
  Administered 2022-02-23: 4 mg via INTRAVENOUS

## 2022-02-23 MED ORDER — PROTAMINE SULFATE 10 MG/ML IV SOLN
INTRAVENOUS | Status: DC | PRN
  Administered 2022-02-23: 40 mg via INTRAVENOUS

## 2022-02-23 MED ORDER — HEPARIN SODIUM (PORCINE) 1000 UNIT/ML IJ SOLN
INTRAMUSCULAR | Status: AC
  Filled 2022-02-23: qty 10

## 2022-02-23 MED ORDER — EPHEDRINE SULFATE-NACL 50-0.9 MG/10ML-% IV SOSY
PREFILLED_SYRINGE | INTRAVENOUS | Status: DC | PRN
  Administered 2022-02-23: 5 mg via INTRAVENOUS

## 2022-02-23 MED ORDER — ACETAMINOPHEN 325 MG PO TABS: 650.0000 mg | ORAL_TABLET | ORAL | Status: DC | PRN

## 2022-02-23 MED ORDER — HEPARIN SODIUM (PORCINE) 1000 UNIT/ML IJ SOLN
INTRAMUSCULAR | Status: DC | PRN
  Administered 2022-02-23: 14000 [IU] via INTRAVENOUS

## 2022-02-23 MED ORDER — LIDOCAINE 2% (20 MG/ML) 5 ML SYRINGE
INTRAMUSCULAR | Status: DC | PRN
  Administered 2022-02-23: 60 mg via INTRAVENOUS

## 2022-02-23 MED ORDER — HEPARIN (PORCINE) IN NACL 1000-0.9 UT/500ML-% IV SOLN
INTRAVENOUS | Status: AC
  Filled 2022-02-23: qty 500

## 2022-02-23 MED ORDER — PHENYLEPHRINE 80 MCG/ML (10ML) SYRINGE FOR IV PUSH (FOR BLOOD PRESSURE SUPPORT)
PREFILLED_SYRINGE | INTRAVENOUS | Status: DC | PRN
  Administered 2022-02-23: 80 ug via INTRAVENOUS

## 2022-02-23 MED ORDER — ROCURONIUM BROMIDE 10 MG/ML (PF) SYRINGE
PREFILLED_SYRINGE | INTRAVENOUS | Status: DC | PRN
  Administered 2022-02-23: 50 mg via INTRAVENOUS

## 2022-02-23 MED ORDER — HEPARIN (PORCINE) IN NACL 1000-0.9 UT/500ML-% IV SOLN
INTRAVENOUS | Status: DC | PRN
  Administered 2022-02-23 (×4): 500 mL

## 2022-02-23 MED ORDER — FENTANYL CITRATE (PF) 250 MCG/5ML IJ SOLN
INTRAMUSCULAR | Status: DC | PRN
  Administered 2022-02-23: 100 ug via INTRAVENOUS

## 2022-02-23 MED ORDER — PHENYLEPHRINE HCL-NACL 20-0.9 MG/250ML-% IV SOLN
INTRAVENOUS | Status: DC | PRN
  Administered 2022-02-23: 30 ug/min via INTRAVENOUS

## 2022-02-23 MED ORDER — MIDAZOLAM HCL 2 MG/2ML IJ SOLN
INTRAMUSCULAR | Status: DC | PRN
  Administered 2022-02-23: 1 mg via INTRAVENOUS

## 2022-02-23 MED ORDER — DEXAMETHASONE SODIUM PHOSPHATE 10 MG/ML IJ SOLN
INTRAMUSCULAR | Status: DC | PRN
  Administered 2022-02-23: 10 mg via INTRAVENOUS

## 2022-02-23 MED ORDER — SODIUM CHLORIDE 0.9 % IV SOLN: INTRAVENOUS | Status: DC

## 2022-02-23 MED ORDER — DOBUTAMINE INFUSION FOR EP/ECHO/NUC (1000 MCG/ML)
INTRAVENOUS | Status: AC
  Filled 2022-02-23: qty 250

## 2022-02-23 SURGICAL SUPPLY — 20 items
BAG SNAP BAND KOVER 36X36 (MISCELLANEOUS) IMPLANT
CATH 8FR REPROCESSED SOUNDSTAR (CATHETERS) ×1 IMPLANT
CATH 8FR SOUNDSTAR REPROCESSED (CATHETERS) IMPLANT
CATH ABLAT QDOT MICRO BI TC DF (CATHETERS) IMPLANT
CATH OCTARAY 2.0 F 3-3-3-3-3 (CATHETERS) IMPLANT
CATH PIGTAIL STEERABLE D1 8.7 (WIRE) IMPLANT
CATH S-M CIRCA TEMP PROBE (CATHETERS) IMPLANT
CATH WEB BI DIR CSDF CRV REPRO (CATHETERS) IMPLANT
CLOSURE PERCLOSE PROSTYLE (VASCULAR PRODUCTS) IMPLANT
COVER SWIFTLINK CONNECTOR (BAG) ×1 IMPLANT
PACK EP LATEX FREE (CUSTOM PROCEDURE TRAY) ×1
PACK EP LF (CUSTOM PROCEDURE TRAY) ×1 IMPLANT
PAD DEFIB RADIO PHYSIO CONN (PAD) ×1 IMPLANT
PATCH CARTO3 (PAD) IMPLANT
SHEATH CARTO VIZIGO SM CVD (SHEATH) IMPLANT
SHEATH PINNACLE 7F 10CM (SHEATH) IMPLANT
SHEATH PINNACLE 8F 10CM (SHEATH) IMPLANT
SHEATH PINNACLE 9F 10CM (SHEATH) IMPLANT
SHEATH PROBE COVER 6X72 (BAG) IMPLANT
TUBING SMART ABLATE COOLFLOW (TUBING) IMPLANT

## 2022-02-23 NOTE — Discharge Instructions (Signed)

## 2022-02-23 NOTE — Anesthesia Postprocedure Evaluation (Signed)
Anesthesia Post Note  Patient: Mary Farrell  Procedure(s) Performed: ATRIAL FIBRILLATION ABLATION     Patient location during evaluation: PACU Anesthesia Type: General Level of consciousness: awake and alert Pain management: pain level controlled Vital Signs Assessment: post-procedure vital signs reviewed and stable Respiratory status: spontaneous breathing, nonlabored ventilation, respiratory function stable and patient connected to nasal cannula oxygen Cardiovascular status: blood pressure returned to baseline and stable Postop Assessment: no apparent nausea or vomiting Anesthetic complications: no   There were no known notable events for this encounter.  Last Vitals:  Vitals:   02/23/22 0945 02/23/22 0946  BP: (!) 159/51   Pulse: 65   Resp: 13   Temp:  36.7 C  SpO2: 99%     Last Pain:  Vitals:   02/23/22 1005  TempSrc:   PainSc: 0-No pain                 Belenda Cruise P Dewain Platz

## 2022-02-23 NOTE — Anesthesia Procedure Notes (Signed)
Procedure Name: Intubation Date/Time: 02/23/2022 7:46 AM  Performed by: Mosetta Pigeon, CRNAPre-anesthesia Checklist: Patient identified, Emergency Drugs available, Suction available and Patient being monitored Patient Re-evaluated:Patient Re-evaluated prior to induction Oxygen Delivery Method: Circle System Utilized Preoxygenation: Pre-oxygenation with 100% oxygen Induction Type: IV induction Ventilation: Mask ventilation without difficulty Laryngoscope Size: Mac and 3 Grade View: Grade II Tube type: Oral Tube size: 7.0 mm Number of attempts: 1 Airway Equipment and Method: Stylet and Oral airway Placement Confirmation: ETT inserted through vocal cords under direct vision, positive ETCO2 and breath sounds checked- equal and bilateral Secured at: 21 cm Tube secured with: Tape Dental Injury: Teeth and Oropharynx as per pre-operative assessment

## 2022-02-23 NOTE — Interval H&P Note (Signed)
History and Physical Interval Note:  02/23/2022 7:08 AM  Mary Farrell  has presented today for surgery, with the diagnosis of A-fib.  The various methods of treatment have been discussed with the patient and family. After consideration of risks, benefits and other options for treatment, the patient has consented to  Procedure(s): ATRIAL FIBRILLATION ABLATION (N/A) as a surgical intervention.  The patient's history has been reviewed, patient examined, no change in status, stable for surgery.  I have reviewed the patient's chart and labs.  Questions were answered to the patient's satisfaction.     [employer]utherford [employer]

## 2022-02-23 NOTE — Transfer of Care (Signed)
Immediate Anesthesia Transfer of Care Note  Patient: Mary Farrell  Procedure(s) Performed: ATRIAL FIBRILLATION ABLATION  Patient Location: PACU  Anesthesia Type:General  Level of Consciousness: drowsy and patient cooperative  Airway & Oxygen Therapy: Patient Spontanous Breathing and Patient connected to nasal cannula oxygen  Post-op Assessment: Report given to RN and Post -op Vital signs reviewed and stable  Post vital signs: Reviewed and stable  Last Vitals:  Vitals Value Taken Time  BP 145/40 02/23/22 0916  Temp    Pulse 61 02/23/22 0919  Resp 14 02/23/22 0919  SpO2 94 % 02/23/22 0919  Vitals shown include unvalidated device data.  Last Pain:  Vitals:   02/23/22 0628  TempSrc:   PainSc: 0-No pain         Complications: There were no known notable events for this encounter.

## 2022-02-24 ENCOUNTER — Encounter (HOSPITAL_COMMUNITY): Payer: Self-pay | Admitting: Cardiology

## 2022-02-24 ENCOUNTER — Telehealth: Payer: Self-pay | Admitting: Cardiology

## 2022-02-24 NOTE — Telephone Encounter (Signed)
Patient called to discuss ablation. Says that she was kind of loopy and did not fully get what the doctor was saying and wanted some clarification. Please call back

## 2022-02-24 NOTE — Telephone Encounter (Signed)
Spoke with the pt and she did not have the Ablation instructions on her AVS and she was asking about taking a shower and how long her lifting instructions were... I sent the After Care instructions to her My Chart and she verbalized understanding after having a chance to read through them.

## 2022-03-02 ENCOUNTER — Encounter: Payer: Self-pay | Admitting: Cardiology

## 2022-03-02 NOTE — Telephone Encounter (Signed)
Sites are sore, uncomfortable, red and swollen. Pt cannot put underwear on. Site not draining. Below groin sites it feels like she has been rubbed raw.  Apologized to pt that I was just now following up with her, as no telephone encounter was started nor sent.  Have reported this to management.  Will forward to AFib clinic to see if they can see pt tomorrow to assess sites, however pt states she is not sure she can go anywhere.  Informed that I am not sure we can advise without assessing  sites, but that AFib clinic would let her know. She is going to call them first thing in the morning to discuss. She appreciates my call.

## 2022-03-23 ENCOUNTER — Ambulatory Visit (HOSPITAL_COMMUNITY): Payer: Medicare Other | Admitting: Physician Assistant

## 2022-03-23 ENCOUNTER — Other Ambulatory Visit (HOSPITAL_COMMUNITY): Payer: Self-pay | Admitting: Physician Assistant

## 2022-03-29 ENCOUNTER — Ambulatory Visit (HOSPITAL_BASED_OUTPATIENT_CLINIC_OR_DEPARTMENT_OTHER): Payer: Medicare Other | Admitting: Cardiovascular Disease

## 2022-04-05 ENCOUNTER — Ambulatory Visit (HOSPITAL_COMMUNITY): Payer: Medicare Other | Admitting: Physician Assistant

## 2022-04-15 NOTE — Progress Notes (Signed)
Primary Care Physician: Lawerance Cruel, MD Referring Physician: Dr. Gillian Scarce f/u   Mary Farrell is a 77 y.o. female with a h/o  persistent atrial fibrillation, mitral valve replacement with Maze procedure 11/2018 who was admitted for recurrent atrial arrhythmias 01/22/19 to 01/28/19.  She was admitted for persistent  atrial fibrillation/flutter.    She underwent cardioversion and was started on flecainide therapy.  She had recurrent atrial flutter despite flecainide therapy, and underwent Tikosyn loading while in-house.  She was maintained on dofetilide 500 mcg twice daily.  Her QTC remained around 490-500 ms.  The electrophysiology attending, Jolyn Nap, MD, reviewed her EKG prior to discharge and agreed with the dose of 500 mcg twice daily.   Now in the clinic she is staying in SR at 55 bpm. qtc is stable at 476 ms. No concerns voiced today. She is feeling improved. CHA2DS2VASc of at least 4 and continues on wafarin.   Follow up in the AF clinic 09/01/21. Patient seen 08/26/21 and was found to be in rapid atrial flutter. She was started on extended release diltiazem. She had some dizziness a couple days later associated with low BP and her carvedilol was decreased. She is back in SR today.   Follow up in the AF clinic 11/12/21. Patient reports that she felt well after her DCCV on 10/14/21 for about 3 weeks. Then she started having palpitations and fatigue. Her carvedilol was resumed and her diltiazem was increased. She is in rate controlled atrial flutter today.   Follow up in the AF clinic 11/25/21. Patient called the clinic back to schedule DCCV as she was more symptomatic. She had repeat DCCV on 11/20/21. Patient feels much better in SR with more energy.   Follow up in the AF clinic 02/10/22. She is in SR and feeling well at this time. Patient is scheduled for ablation with Dr Curt Bears on 02/23/22. No bleeding issues on anticoagulation.   Follow up in the AF clinic 04/15/22. Patient is s/p  repeat afib ablation with Dr Curt Bears on 02/23/22. She reports that she has not had any afib since her ablation. She denies chest pain, swallowing pain, or groin issues. She did have a yeast infection which was treated by her PCP. She has been taking her PRN hydralazine more often for elevated BP.   Today, she denies symptoms of palpitations, chest pain, shortness of breath, orthopnea, PND, lower extremity edema, dizziness, presyncope, syncope, or neurologic sequela. The patient is tolerating medications without difficulties and is otherwise without complaint today.   Past Medical History:  Diagnosis Date   Anxiety 06/29/2012   Aortic regurgitation 08/13/2015   Arthritis    "hands, wrists, back, feet, toes" (06/24/2017)   Atrial flutter ([place]) 09/18/2015   Atypical chest pain 05/13/2016   Chest pain    remote cath in 1996 with NORMAL coronaries noted   CHF (congestive heart failure) ([place])    Dyspnea 10/30/2010   Essential hypertension 09/05/2013   Exogenous obesity    history of    GERD (gastroesophageal reflux disease)    Glaucoma, both eyes    Headache, migraine    "stopped in my 42's" (09/18/2015)   History of cardiovascular stress test 2007   showing no ischemia   Hypertension    Mitral stenosis and aortic insufficiency 06/23/2010   Mitral stenosis with insufficiency    Mitral stenosis with regurgitation    Murmur    of mitral stenosis and moderate aortic insufficiency       Nausea  05/01/2018   Obesity (BMI 30.0-34.9) 02/13/2021   OSA on CPAP    Palpitations    occasional   Paroxysmal A-fib ([place]) 06/24/2017   Pericardial effusion 09/18/2015   Persistent atrial fibrillation ([place]) 03/10/2017   Personal history of rheumatic heart disease    S/P minimally-invasive maze operation for atrial fibrillation 12/13/2018   Complete bilateral atrial lesion set using cryothermy and bipolar radiofrequency ablation with clipping of LA appendage via right mini-thoracotomy approach   S/P minimally-invasive  mitral valve replacement with bioprosthetic valve 12/13/2018   31 mm [facility] Mitral stented bovine pericardial tissue valve   SBE (subacute bacterial endocarditis)    prophylaxis, patient unaware   Sliding hiatal hernia    Stress 02/13/2021   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   Vertigo 05/01/2018   Past Surgical History:  Procedure Laterality Date   [place] N/A 03/10/2017   Procedure: ATRIAL FIBRILLATION ABLATION;  Surgeon: [name], MD;  Location: [place] [facility];  Service: Cardiovascular;  Laterality: N/A;   ATRIAL FIBRILLATION ABLATION  06/24/2017   ATRIAL FIBRILLATION ABLATION N/A 06/24/2017   Procedure: ATRIAL FIBRILLATION ABLATION;  Surgeon: [name], MD;  Location: [place] [facility];  Service: Cardiovascular;  Laterality: N/A;   ATRIAL FIBRILLATION ABLATION N/A 02/23/2022   Procedure: ATRIAL FIBRILLATION ABLATION;  Surgeon: [name], MD;  Location: [place] [facility];  Service: Cardiovascular;  Laterality: N/A;   BUNIONECTOMY Right 2000s   CARDIAC CATHETERIZATION  01/13/1995   normal coronary anatomy and mild mitral stenosis and mild pulmonary hypertension   CARDIOVERSION N/A 09/22/2015   Procedure: CARDIOVERSION;  Surgeon: Jerline Pain, MD;  Location: [place];  Service: Cardiovascular;  Laterality: N/A;   CARDIOVERSION N/A 10/25/2016   Procedure: CARDIOVERSION;  Surgeon: Fay Records, MD;  Location: [place];  Service: Cardiovascular;  Laterality: N/A;   CARDIOVERSION N/A 04/15/2017   Procedure: CARDIOVERSION;  Surgeon: Josue Hector, MD;  Location: [facility] ENDOSCOPY;  Service: Cardiovascular;  Laterality: N/A;   CARDIOVERSION N/A 05/16/2017   Procedure: CARDIOVERSION;  Surgeon: Pixie Casino, MD;  Location: [facility] ENDOSCOPY;  Service: Cardiovascular;  Laterality: N/A;   CARDIOVERSION N/A 01/24/2019   Procedure: CARDIOVERSION;  Surgeon: Pixie Casino, MD;  Location: [facility] ENDOSCOPY;  Service: Cardiovascular;  Laterality: N/A;   CARDIOVERSION N/A 10/14/2021   Procedure: CARDIOVERSION;  Surgeon: [name], MD;  Location: [facility] ENDOSCOPY;  Service: Cardiovascular;  Laterality: N/A;   CARDIOVERSION N/A 11/20/2021   Procedure: CARDIOVERSION;  Surgeon: Fay Records, MD;  Location: [facility] ENDOSCOPY;  Service: Cardiovascular;  Laterality: N/A;   CATARACT EXTRACTION W/ INTRAOCULAR LENS  IMPLANT, BILATERAL Bilateral 2013   COLONOSCOPY  2013   EYE SURGERY Bilateral    cataracts   LAPAROSCOPIC CHOLECYSTECTOMY  1997   MINIMALLY INVASIVE MAZE PROCEDURE N/A 12/13/2018   Procedure: MINIMALLY INVASIVE MAZE PROCEDURE using a 45 MM AtriClip.;  Surgeon: Rexene Alberts, MD;  Location: [place];  Service: Open Heart Surgery;  Laterality: N/A;   MITRAL VALVE REPLACEMENT Right 12/13/2018   Procedure: MINIMALLY INVASIVE MITRAL VALVE (MV) REPLACEMENT using a Magna Mitral Ease 31 MM Valve.;  Surgeon: Rexene Alberts, MD;  Location: [place];  Service: Open Heart Surgery;  Laterality: Right;   RIGHT/LEFT HEART CATH AND CORONARY ANGIOGRAPHY N/A 10/18/2018   Procedure: RIGHT/LEFT HEART CATH AND CORONARY ANGIOGRAPHY;  Surgeon: Leonie Man, MD;  Location: [place] [facility];  Service:  Cardiovascular;  Laterality: N/A;   TEE WITHOUT CARDIOVERSION N/A 06/23/2017   Procedure: TRANSESOPHAGEAL ECHOCARDIOGRAM (TEE);  Surgeon: Fay Records, MD;  Location: [place];  Service: Cardiovascular;  Laterality: N/A;   TEE WITHOUT CARDIOVERSION N/A 10/17/2018   Procedure: TRANSESOPHAGEAL ECHOCARDIOGRAM (TEE);  Surgeon: Jerline Pain, MD;  Location: [facility] ENDOSCOPY;  Service: Cardiovascular;  Laterality: N/A;   TEE WITHOUT CARDIOVERSION N/A 12/13/2018   Procedure: TRANSESOPHAGEAL ECHOCARDIOGRAM (TEE);  Surgeon: Rexene Alberts, MD;  Location: [place];  Service: Open Heart Surgery;  Laterality: N/A;   TONSILLECTOMY AND ADENOIDECTOMY  1990s   UVULOPALATOPHARYNGOPLASTY,  TONSILLECTOMY AND SEPTOPLASTY  1990s    Current Outpatient Medications  Medication Sig Dispense Refill   acetaminophen (TYLENOL) 500 MG tablet Take 500 mg by mouth daily as needed for moderate pain.     ALPRAZolam (XANAX) 0.25 MG tablet Take 0.125-0.25 mg by mouth See admin instructions. Take 0.25 mg at night, may take 0.125 mg dose as needed for anxiety     atorvastatin (LIPITOR) 10 MG tablet Take 10 mg by mouth 2 (two) times a week.     Calcium Citrate-Vitamin D (CALCIUM + D PO) Take 1 tablet by mouth daily.     carvedilol (COREG) 3.125 MG tablet TAKE 1 TABLET BY MOUTH TWICE  DAILY WITH MEALS 180 tablet 3   cetirizine (ZYRTEC) 10 MG tablet Take 10 mg by mouth at bedtime.      Cholecalciferol (VITAMIN D) 50 MCG (2000 UT) tablet Take 2,000 Units by mouth daily.     conjugated estrogens (PREMARIN) vaginal cream Place 1 Applicatorful vaginally 3 (three) times a week.     cyanocobalamin (VITAMIN B12) 1000 MCG tablet Take 1,000 mcg by mouth daily.     diltiazem (CARDIZEM CD) 180 MG 24 hr capsule TAKE 1 CAPSULE (180 MG TOTAL) BY MOUTH DAILY WITH LUNCH. 30 capsule 1   diltiazem (CARDIZEM) 30 MG tablet Take 1 tablet (30 mg total) by mouth 4 (four) times daily as needed (for heart rates greater than 100). 60 tablet 3   dofetilide (TIKOSYN) 500 MCG capsule TAKE 1 CAPSULE BY MOUTH 2 TIMES DAILY. 180 capsule 3   dorzolamide-timolol (COSOPT) 22.3-6.8 MG/ML ophthalmic solution Place 1 drop into both eyes 2 (two) times daily.     ELIQUIS 5 MG TABS tablet TAKE 1 TABLET BY MOUTH TWICE  DAILY 180 tablet 3   fluticasone (FLONASE) 50 MCG/ACT nasal spray Place 1 spray into both nostrils daily.     furosemide (LASIX) 20 MG tablet Take 1 tablet (20 mg total) by mouth daily as needed for fluid. For >3lb/overnight or >5lb/weekly note dose 30 tablet 4   gabapentin (NEURONTIN) 300 MG capsule Take 300 mg by mouth 3 (three) times daily.     latanoprost (XALATAN) 0.005 % ophthalmic solution Place 1 drop into both eyes at  bedtime.   6   Lidocaine-Glycerin (PREPARATION H EX) Apply 1 Application topically daily as needed (hemorrhoids).     meclizine (ANTIVERT) 25 MG tablet Take 25 mg by mouth as needed for dizziness or nausea.     Multiple Vitamin (MULTIVITAMIN WITH MINERALS) TABS tablet Take 1 tablet by mouth daily.     NON FORMULARY Apply 1 application topically 2 (two) times daily as needed (for eczema). Traimcinolone/[employer] Moist Cream     oxybutynin (DITROPAN-XL) 10 MG 24 hr tablet Take 10 mg by mouth daily.     pantoprazole (PROTONIX) 40 MG tablet Take 40 mg by mouth daily.     polyethylene glycol (  MIRALAX / GLYCOLAX) 17 g packet Take 17 g by mouth every other day.     potassium chloride (KLOR-CON) 10 MEQ tablet Take 1 tablet (10 mEq total) by mouth daily as needed (when you take Lasix). 30 tablet 3   PRESCRIPTION MEDICATION Inhale into the lungs at bedtime. CPAP     Probiotic Product (PROBIOTIC PO) Take 1 capsule by mouth daily.     sodium chloride (OCEAN) 0.65 % SOLN nasal spray Place 1 spray into both nostrils at bedtime.     valsartan (DIOVAN) 160 MG tablet Take 1 tablet (160 mg total) by mouth daily. 90 tablet 3   hydrALAZINE (APRESOLINE) 25 MG tablet TAKE 1 TABLET DAILY AS NEEDED FOR BLOOD PRESSURE ABOVE 140 30 tablet 5   magnesium oxide (MAG-OX) 400 (240 Mg) MG tablet Take 1 tablet (400 mg total) by mouth every other day.     No current facility-administered medications for this encounter.    Allergies  Allergen Reactions   Tizanidine Nausea Only   Amoxicillin Diarrhea    Has patient had a PCN reaction causing immediate rash, facial/tongue/throat swelling, SOB or lightheadedness with hypotension: no Has patient had a PCN reaction causing severe rash involving mucus membranes or skin necrosis: no Has patient had a PCN reaction that required hospitalization: no Has patient had a PCN reaction occurring within the last 10 years: no If all of the above answers are "NO", then may proceed with  Cephalosporin use.    Metoprolol Tartrate Other (See Comments)    Mouth sores "mouth tastes like mold"   Oxaprozin Nausea Only    Social History   Socioeconomic History   Marital status: Married    Spouse name: Not on file   Number of children: Not on file   Years of education: Not on file   Highest education level: Not on file  Occupational History   Occupation: Retired  Tobacco Use   Smoking status: Never   Smokeless tobacco: Never   Tobacco comments:    Never smoke 09/01/21  Vaping Use   Vaping Use: Never used  Substance and Sexual Activity   Alcohol use: Yes    Comment: rarely   Drug use: No   Sexual activity: Not Currently  Other Topics Concern   Not on file  Social History Narrative   Lives with husband in [place].   Social Determinants of Health   Financial Resource Strain: Not on file  Food Insecurity: Not on file  Transportation Needs: Not on file  Physical Activity: Not on file  Stress: Not on file  Social Connections: Not on file  Intimate Partner Violence: Not on file    Family History  Problem Relation Age of Onset   Heart attack Mother    Hypertension Brother    Kidney disease Brother    Diabetes Brother    Heart failure Maternal Grandfather     ROS- All systems are reviewed and negative except as per the HPI above  Physical Exam: Vitals:   04/16/22 0916  BP: (!) 156/56  Pulse: (!) 57  Weight: 78.2 kg  Height: 5' (1.524 m)     Wt Readings from Last 3 Encounters:  04/16/22 78.2 kg  02/23/22 78 kg  02/10/22 78.7 kg    Labs: Lab Results  Component Value Date   NA 137 04/16/2022   K 3.9 04/16/2022   CL 102 04/16/2022   CO2 28 04/16/2022   GLUCOSE 109 (H) 04/16/2022   BUN 14 04/16/2022  CREATININE 0.98 04/16/2022   CALCIUM 9.3 04/16/2022   MG 2.3 09/01/2021   Lab Results  Component Value Date   INR 2.6 02/23/2019   No results found for: "CHOL", "HDL", "LDLCALC", "TRIG"   GEN- The patient is a well appearing  elderly female, alert and oriented x 3 today.   HEENT-head normocephalic, atraumatic, sclera clear, conjunctiva pink, hearing intact, trachea midline. Lungs- Clear to ausculation bilaterally, normal work of breathing Heart- Regular rate and rhythm, no murmurs, rubs or gallops  GI- soft, NT, ND, + BS Extremities- no clubbing, cyanosis, or edema MS- no significant deformity or atrophy Skin- no rash or lesion Psych- euthymic mood, full affect Neuro- strength and sensation are intact   EKG today demonstrates SB, 1st degree AV block Vent. rate 57 BPM PR interval 244 ms QRS duration 88 ms QT/QTcB 492/478 ms   Echo 12/07/21  1. Left ventricular ejection fraction, by estimation, is 55 to 60%. Left  ventricular ejection fraction by 3D volume is 55 %. The left ventricle has  normal function. The left ventricle has no regional wall motion  abnormalities. Left ventricular diastolic function could not be evaluated. Elevated left ventricular end-diastolic pressure. The average left ventricular global longitudinal strain is -16.6 %. The global longitudinal strain is normal.   2. Right ventricular systolic function is normal. The right ventricular  size is normal. There is normal pulmonary artery systolic pressure. The  estimated right ventricular systolic pressure is 42.3 mmHg.   3. Left atrial size was mildly dilated.   4. The mitral valve has been repaired/replaced. No evidence of mitral  valve regurgitation. No evidence of mitral stenosis. The mean mitral valve  gradient is 3.0 mmHg at a HR of 52bpm. Echo findings are consistent with  normal structure and function of  the mitral valve prosthesis.   5. The aortic valve is tricuspid. There is moderate calcification of the  aortic valve. There is moderate thickening of the aortic valve. Aortic  valve regurgitation is mild. Aortic valve sclerosis/calcification is  present, without any evidence of aortic  stenosis. Aortic regurgitation PHT  measures 448 msec.   6. Pulmonic valve regurgitation is moderate.   7. The inferior vena cava is normal in size with greater than 50%  respiratory variability, suggesting right atrial pressure of 3 mmHg.    Epic records reviewed   CHA2DS2-VASc Score = 4  The patient's score is based upon: CHF History: 0 HTN History: 1 Diabetes History: 0 Stroke History: 0 Vascular Disease History: 0 Age Score: 2 Gender Score: 1       ASSESSMENT AND PLAN: 1. Persistent Atrial Fibrillation (ICD10:  I48.19) The patient's CHA2DS2-VASc score is 4, indicating a 4.8% annual risk of stroke.   S/p afib ablation x 2 2019, 02/23/22 S/p Maze/ valve repair 11/2018 Recall, she had significant GI side effects with amiodarone previously.  Appears to be maintaining SR.  Continue Tikosyn 500 mcg BID. QT stable.  Continue Eliquis 5 mg BID Continue carvedilol 3.125 mg BID Continue diltiazem 180 mg AM and 120 mg PM Continue diltiazem 30 mg PRN q 4 hours.  2. Secondary Hypercoagulable State (ICD10:  D68.69) The patient is at significant risk for stroke/thromboembolism based upon her CHA2DS2-VASc Score of 4.  Continue Apixaban (Eliquis).   3. HTN Mildly elevated today, her PRN hydralazine is expired, will send in new Rx today.   4. OSA Encouraged compliance with CPAP therapy.   5. Mitral stenosis Rheumatic, s/p MVR with bioprosthetic valve.   Follow  up with Dr Curt Bears as scheduled.    [facility] [address] [place], [place] [address] [phone]

## 2022-04-16 ENCOUNTER — Ambulatory Visit (HOSPITAL_COMMUNITY)
Admission: RE | Admit: 2022-04-16 | Discharge: 2022-04-16 | Disposition: A | Discharge status: Home / Self Care | Payer: Medicare Other | Source: Ambulatory Visit | Attending: Physician Assistant | Admitting: Physician Assistant

## 2022-04-16 VITALS — BP 156/56 | HR 57 | Ht 60.0 in | Wt 172.4 lb

## 2022-04-16 DIAGNOSIS — Z7901 Long term (current) use of anticoagulants: Secondary | ICD-10-CM | POA: Insufficient documentation

## 2022-04-16 DIAGNOSIS — I4819 Other persistent atrial fibrillation: Secondary | ICD-10-CM | POA: Diagnosis not present

## 2022-04-16 DIAGNOSIS — D6869 Other thrombophilia: Secondary | ICD-10-CM | POA: Diagnosis not present

## 2022-04-16 DIAGNOSIS — I4892 Unspecified atrial flutter: Secondary | ICD-10-CM | POA: Diagnosis not present

## 2022-04-16 DIAGNOSIS — I050 Rheumatic mitral stenosis: Secondary | ICD-10-CM | POA: Diagnosis not present

## 2022-04-16 DIAGNOSIS — I493 Ventricular premature depolarization: Secondary | ICD-10-CM | POA: Insufficient documentation

## 2022-04-16 DIAGNOSIS — G4733 Obstructive sleep apnea (adult) (pediatric): Secondary | ICD-10-CM | POA: Insufficient documentation

## 2022-04-16 DIAGNOSIS — I10 Essential (primary) hypertension: Secondary | ICD-10-CM | POA: Diagnosis not present

## 2022-04-16 DIAGNOSIS — Z5181 Encounter for therapeutic drug level monitoring: Secondary | ICD-10-CM

## 2022-04-16 DIAGNOSIS — Z952 Presence of prosthetic heart valve: Secondary | ICD-10-CM | POA: Diagnosis not present

## 2022-04-16 DIAGNOSIS — Z79899 Other long term (current) drug therapy: Secondary | ICD-10-CM | POA: Diagnosis not present

## 2022-04-16 LAB — BASIC METABOLIC PANEL
Anion gap: 7 (ref 5–15)
BUN: 14 mg/dL (ref 8–23)
CO2: 28 mmol/L (ref 22–32)
Calcium: 9.3 mg/dL (ref 8.9–10.3)
Chloride: 102 mmol/L (ref 98–111)
Creatinine, Ser: 0.98 mg/dL (ref 0.44–1.00)
GFR, Estimated: 59 mL/min — ABNORMAL LOW (ref >60)
Glucose, Bld: 109 mg/dL — ABNORMAL HIGH (ref 70–99)
Potassium: 3.9 mmol/L (ref 3.5–5.1)
Sodium: 137 mmol/L (ref 135–145)

## 2022-04-16 LAB — MAGNESIUM: Magnesium: 2.4 mg/dL (ref 1.7–2.4)

## 2022-04-16 MED ORDER — HYDRALAZINE HCL 25 MG PO TABS: ORAL_TABLET | ORAL | 5 refills | Status: DC

## 2022-04-16 MED ORDER — MAGNESIUM OXIDE -MG SUPPLEMENT 400 (240 MG) MG PO TABS: 400.0000 mg | ORAL_TABLET | ORAL | Status: AC

## 2022-04-29 ENCOUNTER — Other Ambulatory Visit (HOSPITAL_COMMUNITY): Payer: Self-pay | Admitting: Physician Assistant

## 2022-05-11 ENCOUNTER — Telehealth (HOSPITAL_COMMUNITY): Payer: Self-pay | Admitting: *Deleted

## 2022-05-11 MED ORDER — DILTIAZEM HCL ER COATED BEADS 120 MG PO CP24: 120.0000 mg | ORAL_CAPSULE | Freq: Every day | ORAL | 1 refills | Status: DC

## 2022-05-11 MED ORDER — DILTIAZEM HCL ER COATED BEADS 120 MG PO CP24: 120.0000 mg | ORAL_CAPSULE | Freq: Every day | ORAL | Status: DC

## 2022-05-11 NOTE — Telephone Encounter (Signed)
Pt states since yesterday she has felt shortness of breath and fatigue/weakness some dizziness. Kardia revealed HR in the mid-40s to low 50s. Discussed with Mary Loa PA will decrease cardizem to  once a day. Call with update in a few days with response. Pt in agreement.

## 2022-05-20 ENCOUNTER — Telehealth: Payer: Self-pay | Admitting: *Deleted

## 2022-05-20 NOTE — Telephone Encounter (Signed)
Contacted regarding PREP Class referral. She is interested in attending at the [facility]. We will call back with future class availability at that site.

## 2022-05-25 ENCOUNTER — Ambulatory Visit: Payer: Medicare Other | Attending: Cardiology | Admitting: Cardiology

## 2022-05-25 ENCOUNTER — Encounter: Payer: Self-pay | Admitting: Cardiology

## 2022-05-25 VITALS — BP 130/84 | HR 49 | Ht 60.0 in | Wt 170.0 lb

## 2022-05-25 DIAGNOSIS — I4819 Other persistent atrial fibrillation: Secondary | ICD-10-CM

## 2022-05-25 DIAGNOSIS — D6869 Other thrombophilia: Secondary | ICD-10-CM | POA: Diagnosis not present

## 2022-05-25 DIAGNOSIS — I10 Essential (primary) hypertension: Secondary | ICD-10-CM

## 2022-05-25 NOTE — Progress Notes (Signed)
Electrophysiology Office Note   Date:  05/25/2022   ID:  Jatoya, Armbrister 1945/05/27, MRN [id number]  PCP:  Daisy Floro, MD  Cardiologist:  Duke Salvia Primary Electrophysiologist:  Ayub Kirsh Jorja Loa, MD    No chief complaint on file.     History of Present Illness: Mary Farrell is a 77 y.o. female who is being seen today for the evaluation of atrial fibrillation at the request of Daisy Floro, MD. Presenting today for electrophysiology evaluation.   She has a history significant for moderate aortic regurgitation, mitral stenosis, atrial fibrillation/flutter, hypertension.  She is post ablation for atrial fibrillation in 2019 with repeat ablation later that year.  She is post surgical MVR, atrial clip, maze in 2020.  She is currently on dofetilide.  She is continue to have episodes of atrial fibrillation and atrial flutter.  She had a third endocardial ablation which showed isolation of her pulmonary veins and posterior wall with CFAE ablation anterior to the right pulmonary veins along the atrial septum.  Today, denies symptoms of palpitations, chest pain, shortness of breath, orthopnea, PND, lower extremity edema, claudication, dizziness, presyncope, syncope, bleeding, or neurologic sequela. The patient is tolerating medications without difficulties.  Since her ablation she has done well.  She does not think that she has been in atrial fibrillation.  Happy with her control.  She did have some dizziness and her diltiazem was reduced.  Aside from, has had no complaints.   Past Medical History:  Diagnosis Date   Anxiety 06/29/2012   Aortic regurgitation 08/13/2015   Arthritis    "hands, wrists, back, feet, toes" (06/24/2017)   Atrial flutter (HCC) 09/18/2015   Atypical chest pain 05/13/2016   Chest pain    remote cath in 1996 with NORMAL coronaries noted   CHF (congestive heart failure) (HCC)    Dyspnea 10/30/2010   Essential hypertension 09/05/2013   Exogenous obesity     history of    GERD (gastroesophageal reflux disease)    Glaucoma, both eyes    Headache, migraine    "stopped in my 50's" (09/18/2015)   History of cardiovascular stress test 2007   showing no ischemia   Hypertension    Mitral stenosis and aortic insufficiency 06/23/2010   Mitral stenosis with insufficiency    Mitral stenosis with regurgitation    Murmur    of mitral stenosis and moderate aortic insufficiency       Nausea 05/01/2018   Obesity (BMI 30.0-34.9) 02/13/2021   OSA on CPAP    Palpitations    occasional   Paroxysmal A-fib (HCC) 06/24/2017   Pericardial effusion 09/18/2015   Persistent atrial fibrillation (HCC) 03/10/2017   Personal history of rheumatic heart disease    S/P minimally-invasive maze operation for atrial fibrillation 12/13/2018   Complete bilateral atrial lesion set using cryothermy and bipolar radiofrequency ablation with clipping of LA appendage via right mini-thoracotomy approach   S/P minimally-invasive mitral valve replacement with bioprosthetic valve 12/13/2018   31 mm [facility] Mitral stented bovine pericardial tissue valve   SBE (subacute bacterial endocarditis)    prophylaxis, patient unaware   Sliding hiatal hernia    Stress 02/13/2021   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   Vertigo 05/01/2018   Past Surgical History:  Procedure Laterality Date   ABDOMINAL HYSTERECTOMY  1975   APPENDECTOMY  1977   ATRIAL FIBRILLATION ABLATION N/A 03/10/2017   Procedure: ATRIAL FIBRILLATION ABLATION;  Surgeon: Regan Lemming, MD;  Location: [facility] INVASIVE CV LAB;  Service: Cardiovascular;  Laterality: N/A;   ATRIAL FIBRILLATION ABLATION  06/24/2017   ATRIAL FIBRILLATION ABLATION N/A 06/24/2017   Procedure: ATRIAL FIBRILLATION ABLATION;  Surgeon: Regan Lemming, MD;  Location: [facility] INVAS[facility] CV LAB;  Service: Cardiovascular;  Laterality: N/A;   ATRIAL FIBRILLATION ABLATION N/A 02/23/2022   Procedure: ATRIAL FIBRILLATION ABLATION;  Surgeon: Regan Lemming, MD;  Location: [facility] INVASIVE CV LAB;  Service: Cardiovascular;  Laterality: N/A;   BUNIONECTOMY Right 2000s   CARDIAC CATHETERIZATION  01/13/1995   normal coronary anatomy and mild mitral stenosis and mild pulmonary hypertension   CARDIOVERSION N/A 09/22/2015   Procedure: CARDIOVERSION;  Surgeon: Jake Bathe, MD;  Location: [facility] ENDOSCOPY;  Service: Cardiovascular;  Laterality: N/A;   CARDIOVERSION N/A 10/25/2016   Procedure: CARDIOVERSION;  Surgeon: Pricilla Riffle, MD;  Location: [facility] ENDOSCOPY;  Service: Cardiovascular;  Laterality: N/A;   CARDIOVERSION N/A 04/15/2017   Procedure: CARDIOVERSION;  Surgeon: Wendall Stade, MD;  Location: [facility] ENDOSCOPY;  Service: Cardiovascular;  Laterality: N/A;   CARDIOVERSION N/A 05/16/2017   Procedure: CARDIOVERSION;  Surgeon: Chrystie Nose, MD;  Location: [facility] ENDOSCOPY;  Service: Cardiovascular;  Laterality: N/A;   CARDIOVERSION N/A 01/24/2019   Procedure: CARDIOVERSION;  Surgeon: Chrystie Nose, MD;  Location: [facility] ENDOSCOPY;  Service: Cardiovascular;  Laterality: N/A;   CARDIOVERSION N/A 10/14/2021   Procedure: CARDIOVERSION;  Surgeon: Little Ishikawa, MD;  Location: [facility] ENDOSCOPY;  Service: Cardiovascular;  Laterality: N/A;   CARDIOVERSION N/A 11/20/2021   Procedure: CARDIOVERSION;  Surgeon: Pricilla Riffle, MD;  Location: [facility] ENDOSCOPY;  Service: Cardiovascular;  Laterality: N/A;   CATARACT EXTRACTION W/ INTRAOCULAR LENS  IMPLANT, BILATERAL Bilateral 2013   COLONOSCOPY  2013   EYE SURGERY Bilateral    cataracts   LAPAROSCOPIC CHOLECYSTECTOMY  1997   MINIMALLY INVASIVE MAZE PROCEDURE N/A 12/13/2018   Procedure: MINIMALLY INVASIVE MAZE PROCEDURE using a 45 MM AtriClip.;  Surgeon: Purcell Nails, MD;  Location: [facility] OR;  Service: Open Heart Surgery;  Laterality: N/A;   MITRAL VALVE REPLACEMENT Right 12/13/2018   Procedure: MINIMALLY INVASIVE MITRAL VALVE (MV) REPLACEMENT using a Magna Mitral Ease 31 MM Valve.;  Surgeon: Purcell Nails, MD;   Location: [facility] OR;  Service: Open Heart Surgery;  Laterality: Right;   RIGHT/LEFT HEART CATH AND CORONARY ANGIOGRAPHY N/A 10/18/2018   Procedure: RIGHT/LEFT HEART CATH AND CORONARY ANGIOGRAPHY;  Surgeon: Marykay Lex, MD;  Location: [facility] INVASIVE CV LAB;  Service: Cardiovascular;  Laterality: N/A;   TEE WITHOUT CARDIOVERSION N/A 06/23/2017   Procedure: TRANSESOPHAGEAL ECHOCARDIOGRAM (TEE);  Surgeon: Pricilla Riffle, MD;  Location: [facility] ENDOSCOPY;  Service: Cardiovascular;  Laterality: N/A;   TEE WITHOUT CARDIOVERSION N/A 10/17/2018   Procedure: TRANSESOPHAGEAL ECHOCARDIOGRAM (TEE);  Surgeon: Jake Bathe, MD;  Location: [facility] ENDOSCOPY;  Service: Cardiovascular;  Laterality: N/A;   TEE WITHOUT CARDIOVERSION N/A 12/13/2018   Procedure: TRANSESOPHAGEAL ECHOCARDIOGRAM (TEE);  Surgeon: Purcell Nails, MD;  Location: [facility] OR;  Service: Open Heart Surgery;  Laterality: N/A;   TONSILLECTOMY AND ADENOIDECTOMY  1990s   UVULOPALATOPHARYNGOPLASTY, TONSILLECTOMY AND SEPTOPLASTY  1990s     Current Outpatient Medications  Medication Sig Dispense Refill   acetaminophen (TYLENOL) 500 MG tablet Take 500 mg by mouth daily as needed for moderate pain.     ALPRAZolam (XANAX) 0.25 MG tablet Take 0.125-0.25 mg by mouth See admin instructions. Take 0.25 mg at night, may take 0.125 mg dose as needed for anxiety     atorvastatin (LIPITOR) 10 MG tablet Take 10  mg by mouth 2 (two) times a week.     Calcium Citrate-Vitamin D (CALCIUM + D PO) Take 1 tablet by mouth daily.     carvedilol (COREG) 3.125 MG tablet TAKE 1 TABLET BY MOUTH TWICE  DAILY WITH MEALS 180 tablet 3   cetirizine (ZYRTEC) 10 MG tablet Take 10 mg by mouth at bedtime.      Cholecalciferol (VITAMIN D) 50 MCG (2000 UT) tablet Take 2,000 Units by mouth daily.     conjugated estrogens (PREMARIN) vaginal cream Place 1 Applicatorful vaginally 3 (three) times a week.     cyanocobalamin (VITAMIN B12) 1000 MCG tablet Take 1,000 mcg by mouth daily.     diltiazem  (CARDIZEM CD) 120 MG 24 hr capsule Take 1 capsule (120 mg total) by mouth daily. 30 capsule 1   diltiazem (CARDIZEM) 30 MG tablet Take 1 tablet (30 mg total) by mouth 4 (four) times daily as needed (for heart rates greater than 100). 60 tablet 3   dofetilide (TIKOSYN) 500 MCG capsule TAKE 1 CAPSULE BY MOUTH 2 TIMES DAILY. 180 capsule 3   dorzolamide-timolol (COSOPT) 22.3-6.8 MG/ML ophthalmic solution Place 1 drop into both eyes 2 (two) times daily.     ELIQUIS 5 MG TABS tablet TAKE 1 TABLET BY MOUTH TWICE  DAILY 180 tablet 3   fluticasone (FLONASE) 50 MCG/ACT nasal spray Place 1 spray into both nostrils daily.     furosemide (LASIX) 20 MG tablet Take 1 tablet (20 mg total) by mouth daily as needed for fluid. For >3lb/overnight or >5lb/weekly note dose 30 tablet 4   gabapentin (NEURONTIN) 300 MG capsule Take 300 mg by mouth 3 (three) times daily.     hydrALAZINE (APRESOLINE) 25 MG tablet TAKE 1 TABLET DAILY AS NEEDED FOR BLOOD PRESSURE ABOVE 140 30 tablet 5   latanoprost (XALATAN) 0.005 % ophthalmic solution Place 1 drop into both eyes at bedtime.   6   Lidocaine-Glycerin (PREPARATION H EX) Apply 1 Application topically daily as needed (hemorrhoids).     magnesium oxide (MAG-OX) 400 (240 Mg) MG tablet Take 1 tablet (400 mg total) by mouth every other day.     meclizine (ANTIVERT) 25 MG tablet Take 25 mg by mouth as needed for dizziness or nausea.     Multiple Vitamin (MULTIVITAMIN WITH MINERALS) TABS tablet Take 1 tablet by mouth daily.     NON FORMULARY Apply 1 application topically 2 (two) times daily as needed (for eczema). Traimcinolone/[employer] Moist Cream     oxybutynin (DITROPAN-XL) 10 MG 24 hr tablet Take 10 mg by mouth daily.     pantoprazole (PROTONIX) 40 MG tablet Take 40 mg by mouth daily.     polyethylene glycol (MIRALAX / GLYCOLAX) 17 g packet Take 17 g by mouth every other day.     potassium chloride (KLOR-CON) 10 MEQ tablet Take 1 tablet (10 mEq total) by mouth daily as needed (when you  take Lasix). 30 tablet 3   PRESCRIPTION MEDICATION Inhale into the lungs at bedtime. CPAP     Probiotic Product (PROBIOTIC PO) Take 1 capsule by mouth daily.     sodium chloride (OCEAN) 0.65 % SOLN nasal spray Place 1 spray into both nostrils at bedtime.     valsartan (DIOVAN) 160 MG tablet Take 1 tablet (160 mg total) by mouth daily. 90 tablet 3   No current facility-administered medications for this visit.    Allergies:   Tizanidine, Amoxicillin, Metoprolol tartrate, and Oxaprozin   Social History:  The patient  reports that she has never smoked. She has never used smokeless tobacco. She reports current alcohol use. She reports that she does not use drugs.   Family History:  The patient's family history includes Diabetes in her brother; Heart attack in her mother; Heart failure in her maternal grandfather; Hypertension in her brother; Kidney disease in her brother.   ROS:  Please see the history of present illness.   Otherwise, review of systems is positive for none.   All other systems are reviewed and negative.   PHYSICAL EXAM: VS:  BP 130/84   Pulse (!) 49   Ht 5' (1.524 m)   Wt 170 lb (77.1 kg)   SpO2 96%   BMI 33.20 kg/m  , BMI Body mass index is 33.2 kg/m. GEN: Well nourished, well developed, in no acute distress  HEENT: normal  Neck: no JVD, carotid bruits, or masses Cardiac: RRR; no murmurs, rubs, or gallops,no edema  Respiratory:  clear to auscultation bilaterally, normal work of breathing GI: soft, nontender, nondistended, + BS MS: no deformity or atrophy  Skin: warm and dry Neuro:  Strength and sensation are intact Psych: euthymic mood, full affect  EKG:  EKG is ordered today. Personal review of the ekg ordered shows sinus rhythm, rate 49, first-degree AV block  Recent Labs: 11/18/2021: B Natriuretic Peptide 73.9 02/10/2022: Hemoglobin 13.0; Platelets 187 04/16/2022: BUN 14; Creatinine, Ser 0.98; Magnesium 2.4; Potassium 3.9; Sodium 137    Lipid Panel  No  results found for: "CHOL", "TRIG", "HDL", "CHOLHDL", "VLDL", "LDLCALC", "LDLDIRECT"   Wt Readings from Last 3 Encounters:  05/25/22 170 lb (77.1 kg)  04/16/22 172 lb 6.4 oz (78.2 kg)  02/23/22 172 lb (78 kg)      Other studies Reviewed: Additional studies/ records that were reviewed today include: TTE 01/23/19  Review of the above records today demonstrates:   1. Left ventricular ejection fraction, by visual estimation, is 55 to  60%. The left ventricle has normal function. Left ventricular septal wall  thickness was mildly increased. Moderately increased left ventricular  posterior wall thickness. There is  mildly increased left ventricular hypertrophy.   2. Left ventricular diastolic parameters are indeterminate.   3. The left ventricle demonstrates regional wall motion abnormalities.   4. Elevated LVEDP. Hypokinesis of the basal septum consistent with  post-operative state.   5. Global right ventricle has normal systolic function.The right  ventricular size is normal. No increase in right ventricular wall  thickness.   6. Left atrial size was severely dilated.   7. Right atrial size was normal.   8. The mitral valve has been repaired/replaced. No evidence of mitral  valve regurgitation. No evidence of mitral stenosis.   9. The tricuspid valve is normal in structure.  10. The aortic valve is tricuspid. Aortic valve regurgitation is mild. No  evidence of aortic valve sclerosis or stenosis.  11. The pulmonic valve was normal in structure. Pulmonic valve  regurgitation is not visualized.  12. Normal pulmonary artery systolic pressure.  13. The inferior vena cava is normal in size with <50% respiratory  variability, suggesting right atrial pressure of 8 mmHg.    ASSESSMENT AND PLAN:  1.  Persistent atrial fibrillation: Status post ablation x 2 in 2019.  Is now status post repeat ablation 02/23/2022.  Currently on Coumadin, dofetilide.  CHA2DS2-VASc of 3.  She is remained in  sinus rhythm.  She is on diltiazem 120.  Her heart rate is slow and she has been fatigued.  Negar Sieler  stop diltiazem.  2.  Pericardial effusion: Has never had tamponade.  Now postop.  No changes.  3.  Hypertension: Currently well-controlled  4.  Mitral stenosis: Status post MVR with bioprosthetic mitral valve and maze.  Stable on most recent echo.  No changes.  5.  Secondary hypercoagulable state: Currently on Eliquis for atrial fibrillation   Current medicines are reviewed at length with the patient today.   The patient does not have concerns regarding her medicines.  The following changes were made today: Stop diltiazem  Labs/ tests ordered today include:  Orders Placed This Encounter  Procedures   EKG 12-Lead      Disposition:   FU 6 months  Signed, Whitney Hillegass Jorja Loa, MD  05/25/2022 12:02 PM     [facility] [address] [place] [address] [phone] (office) [phone] (fax)

## 2022-05-25 NOTE — Patient Instructions (Signed)
Medication Instructions:  Your physician has recommended you make the following change in your medication:  STOP Diltiazem  *If you need a refill on your cardiac medications before your next appointment, please call your pharmacy*   Lab Work: None ordered   Testing/Procedures: None ordered   Follow-Up: At [facility], you and your health needs are our priority.  As part of our continuing mission to provide you with exceptional heart care, we have created designated Provider Care Teams.  These Care Teams include your primary Cardiologist (physician) and Advanced Practice Providers (APPs -  Physician Assistants and Nurse Practitioners) who all work together to provide you with the care you need, when you need it.  Your next appointment:   6 month(s)  The format for your next appointment:   In Person  Provider:   You will see one of the following Advanced Practice Providers on your designated Care Team:   Francis Dowse, [place] Casimiro Needle "Mardelle Matte" Lanna Poche, [place]   Thank you for choosing [facility]!!   Dory Horn, RN [phone]  Other Instructions

## 2022-05-28 ENCOUNTER — Telehealth: Payer: Self-pay

## 2022-05-28 NOTE — Telephone Encounter (Signed)
Call returned. Is interested in participating in PREP. Prefers a T/TH schedule, 230p-345p   Next [facility] [employer] class will be 07/20/22.  Patient agreeable. Has my number for call back. Will schedule intake closer to class.

## 2022-05-28 NOTE — Telephone Encounter (Signed)
Call to pt reference next PREP class starting 06/14/22 Left message for call back

## 2022-05-31 ENCOUNTER — Encounter (HOSPITAL_BASED_OUTPATIENT_CLINIC_OR_DEPARTMENT_OTHER): Payer: Self-pay | Admitting: Cardiovascular Disease

## 2022-05-31 DIAGNOSIS — I4819 Other persistent atrial fibrillation: Secondary | ICD-10-CM

## 2022-05-31 DIAGNOSIS — D6869 Other thrombophilia: Secondary | ICD-10-CM

## 2022-05-31 DIAGNOSIS — I10 Essential (primary) hypertension: Secondary | ICD-10-CM

## 2022-05-31 MED ORDER — VALSARTAN 320 MG PO TABS: 320.0000 mg | ORAL_TABLET | Freq: Every day | ORAL | 11 refills | Status: DC

## 2022-05-31 NOTE — Telephone Encounter (Signed)
Please advise for potential med changes, thanks!

## 2022-06-02 ENCOUNTER — Telehealth: Payer: Self-pay

## 2022-06-02 ENCOUNTER — Telehealth (HOSPITAL_BASED_OUTPATIENT_CLINIC_OR_DEPARTMENT_OTHER): Payer: Self-pay | Admitting: Cardiovascular Disease

## 2022-06-02 DIAGNOSIS — I4819 Other persistent atrial fibrillation: Secondary | ICD-10-CM

## 2022-06-02 DIAGNOSIS — D6869 Other thrombophilia: Secondary | ICD-10-CM

## 2022-06-02 DIAGNOSIS — I10 Essential (primary) hypertension: Secondary | ICD-10-CM

## 2022-06-02 MED ORDER — VALSARTAN 160 MG PO TABS: 160.0000 mg | ORAL_TABLET | Freq: Two times a day (BID) | ORAL | 1 refills | Status: DC

## 2022-06-02 NOTE — Telephone Encounter (Signed)
Returned call to patient,   Patient states she started the Valsartan 320mg  dose. She takes it and a couple hours later feeling hot, flushed, nauseated, dizzy and lightheaded. She states she just took her blood pressure. 159/63 ( wrist cuff) 159/71 ( arm cuff). She states she is  unsure on whether it is the medication or the high blood pressure that is making her feel so bad. She is wanting to know if she can trial doing a 160mg  in the morning and a 160mg  in the evening, since the 320 could be too much all at once.  She has not noted any low blood pressures since increasing the medication.

## 2022-06-02 NOTE — Telephone Encounter (Signed)
How are her BP looking? If they're low, would explain dizzy and lightheaded symptoms and would be reasonable to decrease her dose, however her prior message stated her BP was high on 160mg  dose.   She's on diltiazem so can't add on amlodipine, she's on Tikosyn which is contraindicated with thiazide diuretics, so next BP option would need to be spironolactone. PharmD has not seen pt, just responded to prior med question, she's scheduled to see Luther Parody in a month and needs to bring in her home BP cuff and readings to that visit to verify accuracy of her cuff.

## 2022-06-02 NOTE — Telephone Encounter (Signed)
That is fine to split up her valsartan and see if she tolerates it better. Pending BMET and BP at f/u visit with [facility], can add on spironolactone if additional BP lowering is needed.

## 2022-06-02 NOTE — Telephone Encounter (Signed)
Returned call to patient, provided the following recommendations. Patient will try 160 BID and bring blood pressure cuff to her office visit on 6/4   "That is fine to split up her valsartan and see if she tolerates it better. Pending BMET and BP at f/u visit with [facility], can add on spironolactone if additional BP lowering is needed."

## 2022-06-02 NOTE — Telephone Encounter (Signed)
Medication previously adjust by pharmd, please advise

## 2022-06-02 NOTE — Telephone Encounter (Signed)
Call from pt. Wanted to switch to an earlier in the day class.  Chose MW 1p-215p Class tentatively will start on 07/12/22 Will call her closer to class start to do intake and firm up start date.

## 2022-06-02 NOTE — Telephone Encounter (Signed)
Pt c/o medication issue:  1. Name of Medication:   valsartan (DIOVAN) 320 MG tablet   2. How are you currently taking this medication (dosage and times per day)?  As prescribed  3. Are you having a reaction (difficulty breathing--STAT)?   No  4. What is your medication issue?   Patient stated the dosage was doubled and she is feeling terrible - flushed, dizzy and lightheaded.  Patient wants to go back to previous dose and take 1 tablet in morning and 1 at night.

## 2022-06-04 NOTE — Progress Notes (Signed)
Received text from pt. Really needs to do a T/TH class so will change back to start on 6/25 230p-345p.

## 2022-06-29 ENCOUNTER — Encounter (HOSPITAL_BASED_OUTPATIENT_CLINIC_OR_DEPARTMENT_OTHER): Payer: Self-pay | Admitting: Family

## 2022-06-29 ENCOUNTER — Ambulatory Visit (INDEPENDENT_AMBULATORY_CARE_PROVIDER_SITE_OTHER): Payer: Medicare Other | Admitting: Family

## 2022-06-29 VITALS — BP 131/66 | HR 56 | Ht 60.0 in | Wt 166.7 lb

## 2022-06-29 DIAGNOSIS — I4819 Other persistent atrial fibrillation: Secondary | ICD-10-CM

## 2022-06-29 DIAGNOSIS — D6859 Other primary thrombophilia: Secondary | ICD-10-CM

## 2022-06-29 DIAGNOSIS — I6523 Occlusion and stenosis of bilateral carotid arteries: Secondary | ICD-10-CM

## 2022-06-29 DIAGNOSIS — Z953 Presence of xenogenic heart valve: Secondary | ICD-10-CM

## 2022-06-29 DIAGNOSIS — I10 Essential (primary) hypertension: Secondary | ICD-10-CM

## 2022-06-29 MED ORDER — VALSARTAN 160 MG PO TABS: 160.0000 mg | ORAL_TABLET | Freq: Two times a day (BID) | ORAL | 1 refills | Status: DC

## 2022-06-29 NOTE — Patient Instructions (Signed)
Medication Instructions:  Your physician has recommended you make the following change in your medication:   Change: Valsartan 160mg  twice a day   *If you need a refill on your cardiac medications before your next appointment, please call your pharmacy*   Lab Work: Your physician recommends that you return for lab work today- BMP  If you have labs (blood work) drawn today and your tests are completely normal, you will receive your results only by: MyChart Message (if you have MyChart) OR A paper copy in the mail If you have any lab test that is abnormal or we need to change your treatment, we will call you to review the results.  Follow-Up: At [facility], you and your health needs are our priority.  As part of our continuing mission to provide you with exceptional heart care, we have created designated Provider Care Teams.  These Care Teams include your primary Cardiologist (physician) and Advanced Practice Providers (APPs -  Physician Assistants and Nurse Practitioners) who all work together to provide you with the care you need, when you need it.  We recommend signing up for the patient portal called "MyChart".  Sign up information is provided on this After Visit Summary.  MyChart is used to connect with patients for Virtual Visits (Telemedicine).  Patients are able to view lab/test results, encounter notes, upcoming appointments, etc.  Non-urgent messages can be sent to your provider as well.   To learn more about what you can do with MyChart, go to [email or web address].    Your next appointment:   6 month(s)  Provider:   Chilton Si, MD or Gillian Shields, NP    Other Instructions Rutherford Guys will follow up on BP in one week

## 2022-06-29 NOTE — Progress Notes (Signed)
Office Visit    Patient Name: Mary Farrell Date of Encounter: 06/29/2022  PCP:  Daisy Floro, MD   [facility]  Cardiologist:  Chilton Si, MD  Advanced Practice Provider:  No care team member to display Electrophysiologist:  Will Jorja Loa, MD     Chief Complaint    Mary Farrell is a 77 y.o. female presents today for follow up of hypertension.  Past Medical History    Past Medical History:  Diagnosis Date   Anxiety 06/29/2012   Aortic regurgitation 08/13/2015   Arthritis    "hands, wrists, back, feet, toes" (06/24/2017)   Atrial flutter (HCC) 09/18/2015   Atypical chest pain 05/13/2016   Chest pain    remote cath in 1996 with NORMAL coronaries noted   CHF (congestive heart failure) (HCC)    Dyspnea 10/30/2010   Essential hypertension 09/05/2013   Exogenous obesity    history of    GERD (gastroesophageal reflux disease)    Glaucoma, both eyes    Headache, migraine    "stopped in my 50's" (09/18/2015)   History of cardiovascular stress test 2007   showing no ischemia   Hypertension    Mitral stenosis and aortic insufficiency 06/23/2010   Mitral stenosis with insufficiency    Mitral stenosis with regurgitation    Murmur    of mitral stenosis and moderate aortic insufficiency       Nausea 05/01/2018   Obesity (BMI 30.0-34.9) 02/13/2021   OSA on CPAP    Palpitations    occasional   Paroxysmal A-fib (HCC) 06/24/2017   Pericardial effusion 09/18/2015   Persistent atrial fibrillation (HCC) 03/10/2017   Personal history of rheumatic heart disease    S/P minimally-invasive maze operation for atrial fibrillation 12/13/2018   Complete bilateral atrial lesion set using cryothermy and bipolar radiofrequency ablation with clipping of LA appendage via right mini-thoracotomy approach   S/P minimally-invasive mitral valve replacement with bioprosthetic valve 12/13/2018   31 mm [facility] Mitral stented bovine pericardial tissue valve   SBE  (subacute bacterial endocarditis)    prophylaxis, patient unaware   Sliding hiatal hernia    Stress 02/13/2021   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   Vertigo 05/01/2018   Past Surgical History:  Procedure Laterality Date   ABDOMINAL HYSTERECTOMY  1975   APPENDECTOMY  1977   ATRIAL FIBRILLATION ABLATION N/A 03/10/2017   Procedure: ATRIAL FIBRILLATION ABLATION;  Surgeon: Regan Lemming, MD;  Location: [facility] INVASIVE CV LAB;  Service: Cardiovascular;  Laterality: N/A;   ATRIAL FIBRILLATION ABLATION  06/24/2017   ATRIAL FIBRILLATION ABLATION N/A 06/24/2017   Procedure: ATRIAL FIBRILLATION ABLATION;  Surgeon: Regan Lemming, MD;  Location: [facility] INVASIVE CV LAB;  Service: Cardiovascular;  Laterality: N/A;   ATRIAL FIBRILLATION ABLATION N/A 02/23/2022   Procedure: ATRIAL FIBRILLATION ABLATION;  Surgeon: Regan Lemming, MD;  Location: [facility] INVASIVE CV LAB;  Service: Cardiovascular;  Laterality: N/A;   BUNIONECTOMY Right 2000s   CARDIAC CATHETERIZATION  01/13/1995   normal coronary anatomy and mild mitral stenosis and mild pulmonary hypertension   CARDIOVERSION N/A 09/22/2015   Procedure: CARDIOVERSION;  Surgeon: Jake Bathe, MD;  Location: [facility] ENDOSCOPY;  Service: Cardiovascular;  Laterality: N/A;   CARDIOVERSION N/A 10/25/2016   Procedure: CARDIOVERSION;  Surgeon: Pricilla Riffle, MD;  Location: [facility] ENDOSCOPY;  Service: Cardiovascular;  Laterality: N/A;   CARDIOVERSION N/A 04/15/2017   Procedure: CARDIOVERSION;  Surgeon: Wendall Stade, MD;  Location: [facility] ENDOSCOPY;  Service: Cardiovascular;  Laterality: N/A;   CARDIOVERSION N/A 05/16/2017   Procedure: CARDIOVERSION;  Surgeon: Chrystie Nose, MD;  Location: [facility] ENDOSCOPY;  Service: Cardiovascular;  Laterality: N/A;   CARDIOVERSION N/A 01/24/2019   Procedure: CARDIOVERSION;  Surgeon: Chrystie Nose, MD;  Location: [facility] ENDOSCOPY;  Service: Cardiovascular;  Laterality: N/A;   CARDIOVERSION N/A 10/14/2021   Procedure:  CARDIOVERSION;  Surgeon: Little Ishikawa, MD;  Location: [facility] ENDOSCOPY;  Service: Cardiovascular;  Laterality: N/A;   CARDIOVERSION N/A 11/20/2021   Procedure: CARDIOVERSION;  Surgeon: Pricilla Riffle, MD;  Location: [facility] ENDOSCOPY;  Service: Cardiovascular;  Laterality: N/A;   CATARACT EXTRACTION W/ INTRAOCULAR LENS  IMPLANT, BILATERAL Bilateral 2013   COLONOSCOPY  2013   EYE SURGERY Bilateral    cataracts   LAPAROSCOPIC CHOLECYSTECTOMY  1997   MINIMALLY INVASIVE MAZE PROCEDURE N/A 12/13/2018   Procedure: MINIMALLY INVASIVE MAZE PROCEDURE using a 45 MM AtriClip.;  Surgeon: Purcell Nails, MD;  Location: [facility] OR;  Service: Open Heart Surgery;  Laterality: N/A;   MITRAL VALVE REPLACEMENT Right 12/13/2018   Procedure: MINIMALLY INVASIVE MITRAL VALVE (MV) REPLACEMENT using a Magna Mitral Ease 31 MM Valve.;  Surgeon: Purcell Nails, MD;  Location: [facility] OR;  Service: Open Heart Surgery;  Laterality: Right;   RIGHT/LEFT HEART CATH AND CORONARY ANGIOGRAPHY N/A 10/18/2018   Procedure: RIGHT/LEFT HEART CATH AND CORONARY ANGIOGRAPHY;  Surgeon: Marykay Lex, MD;  Location: [facility] INVASIVE CV LAB;  Service: Cardiovascular;  Laterality: N/A;   TEE WITHOUT CARDIOVERSION N/A 06/23/2017   Procedure: TRANSESOPHAGEAL ECHOCARDIOGRAM (TEE);  Surgeon: Pricilla Riffle, MD;  Location: [facility] ENDOSCOPY;  Service: Cardiovascular;  Laterality: N/A;   TEE WITHOUT CARDIOVERSION N/A 10/17/2018   Procedure: TRANSESOPHAGEAL ECHOCARDIOGRAM (TEE);  Surgeon: Jake Bathe, MD;  Location: [facility] ENDOSCOPY;  Service: Cardiovascular;  Laterality: N/A;   TEE WITHOUT CARDIOVERSION N/A 12/13/2018   Procedure: TRANSESOPHAGEAL ECHOCARDIOGRAM (TEE);  Surgeon: Purcell Nails, MD;  Location: [facility] OR;  Service: Open Heart Surgery;  Laterality: N/A;   TONSILLECTOMY AND ADENOIDECTOMY  1990s   UVULOPALATOPHARYNGOPLASTY, TONSILLECTOMY AND SEPTOPLASTY  1990s    Allergies  Allergies  Allergen Reactions   Tizanidine Nausea Only   Amoxicillin  Diarrhea    Has patient had a PCN reaction causing immediate rash, facial/tongue/throat swelling, SOB or lightheadedness with hypotension: no Has patient had a PCN reaction causing severe rash involving mucus membranes or skin necrosis: no Has patient had a PCN reaction that required hospitalization: no Has patient had a PCN reaction occurring within the last 10 years: no If all of the above answers are "NO", then may proceed with Cephalosporin use.    Metoprolol Tartrate Other (See Comments)    Mouth sores "mouth tastes like mold"   Oxaprozin Nausea Only    History of Present Illness    Mary Farrell is a 77 y.o. female with a hx of moderate aortic regurgitation and mitral stenosis s/p surgical MVR, atrial clip, maze 11/2018, atrial fibrillation/flutter s/p atrial fibrillation ablation 03/10/2017 with repeat ablation 06/03/2017, hypertension last seen 04/2022 by Dr. Elberta Fortis.  At last visit with Dr. Duke Salvia 09/03/21 her Valsartan was reduced to 160mg  daily due to lightheadedness and hypotension at home..  Follows with Dr. Elberta Fortis of EP.  She has been on multiple antiarrhythmics and presently on dofetilide.  Seen 09/25/2021 by Dr. Elberta Fortis.  Notes she went in atrial flutter a few weeks ago and felt poorly during episodes.  She was set up for cardioversion 10/14/2021 performed by Dr. Bjorn Pippin. Underwent atrial fibrillation  ablation 02/23/2022. Last seen 05/25/2022 by Dr. Elberta Fortis.  She was maintaining sinus rhythm manage she was fatigued and bradycardic diltiazem 120 mg was discontinued.  Via subsequent phone and MyChart encounters due to elevated blood pressure her Valsartan was increased to 320mg  daily.   Presents today for follow up independently. Since stopping Diltiazem heart rate routinely the 50s by home monitoring. No recurrent palpitations. Notes she is often having to take her Hydralazine for SBP >140 in the morning. BP at home this morning 118/47 which is the lowest she has gotten. She is  presently taking Valsartan 320mg  daily. She is taking her Lasix at most once per week. Does note stressors as her husband has had more memory difficulties.   Home  BP cuff: 135/55 Manual BP check: 131/66 with repeat 134/68  EKGs/Labs/Other Studies Reviewed:   The following studies were reviewed today: Cardiac Studies & Procedures   CARDIAC CATHETERIZATION  CARDIAC CATHETERIZATION 10/18/2018  Narrative Images from the original result were not included.   Hemodynamic findings consistent with mild pulmonary hypertension.  LV end diastolic pressure is normal.  There is severe mitral valve stenosis -suggested by a echocardiogram, large PCWP V waves along with elevated PCWP but normal LVEDP would also suggest this.  Angiographically normal coronary arteries  SUMMARY  Angiographically normal coronary arteries  Mild pulmonary hypertension with mildly elevated PCWP but normal LVEDP suggestive of mitral stenosis  Significant V wave noted on PCWP waveform suggesting mitral stenosis.  RECOMMENDATIONS  Return to nursing unit for ongoing care.  Proceed with plans for mitral valve replacement    Bryan Lemma, M.D., M.S. Interventional Cardiologist  Pager # [phone] Phone # [phone] [address] [place], [place] [address]  Findings Coronary Findings Diagnostic  Dominance: Right  Left Main Vessel was injected. Vessel is normal in caliber and large. Vessel is angiographically normal.  Left Anterior Descending Vessel was injected. Vessel is normal in caliber and large. Vessel is angiographically normal.  First Diagonal Branch Vessel is small in size.  Second Diagonal Branch Vessel is small in size.  Third Diagonal Branch Vessel is small in size.  Left Circumflex Vessel was injected. Vessel is normal in caliber and large. Vessel is angiographically normal.  Left Posterior Atrioventricular Artery Vessel is small in size.  Right Coronary  Artery Vessel was injected. Vessel is normal in caliber and large. Vessel is angiographically normal.  Right Ventricular Branch Vessel is small in size.  First Right Posterolateral Branch Vessel is small in size.  Second Right Posterolateral Branch Vessel is small in size.  Intervention  No interventions have been documented.   STRESS TESTS  MYOCARDIAL PERFUSION IMAGING 08/26/2015  Narrative  The left ventricular ejection fraction is normal (55-65%).  Nuclear stress EF: 60%.  Blood pressure demonstrated a hypertensive response to exercise.  Upsloping ST segment depression ST segment depression of 1 mm was noted during stress in the II, III, V6, V5 and aVF leads.  This is a low risk study.  No reversible ischemia. LVEF 60% with normal wall motion. Fair exercise tolerance. No chest pain. This is a low risk study.   ECHOCARDIOGRAM  ECHOCARDIOGRAM COMPLETE 12/08/2021  Narrative ECHOCARDIOGRAM REPORT    Patient Name:   Mary Farrell Date of Exam: 12/07/2021 Medical Rec #:  [id number]    Height:       60.0 in Accession #:    [id number]   Weight:       173.2 lb Date of Birth:  Nov 08, 1945   BSA:  1.756 m Patient Age:    69 years     BP:           140/60 mmHg Patient Gender: F            HR:           50 bpm. Exam Location:  Outpatient  Procedure: 2D Echo, 3D Echo, Color Doppler, Cardiac Doppler and Strain Analysis  Indications:    Mitral Valve Disorder I05.9  History:        Patient has prior history of Echocardiogram examinations, most recent 09/24/2020. Arrythmias:Atrial Fibrillation and Atrial Flutter, Signs/Symptoms:Chest Pain; Risk Factors:Non-Smoker and Hypertension. 31 mm [facility] Mitral stented bovine pericardial tissue valve mitral valve replacement with Maze procedure 11/2018.  Sonographer:    Jeryl Columbia RDCS Referring Phys: [id number] Tarance Balan S Solenne Manwarren  IMPRESSIONS   1. Left ventricular ejection fraction, by estimation, is 55 to 60%.  Left ventricular ejection fraction by 3D volume is 55 %. The left ventricle has normal function. The left ventricle has no regional wall motion abnormalities. Left ventricular diastolic function could not be evaluated. Elevated left ventricular end-diastolic pressure. The average left ventricular global longitudinal strain is -16.6 %. The global longitudinal strain is normal. 2. Right ventricular systolic function is normal. The right ventricular size is normal. There is normal pulmonary artery systolic pressure. The estimated right ventricular systolic pressure is 25.8 mmHg. 3. Left atrial size was mildly dilated. 4. The mitral valve has been repaired/replaced. No evidence of mitral valve regurgitation. No evidence of mitral stenosis. The mean mitral valve gradient is 3.0 mmHg at a HR of 52bpm. Echo findings are consistent with normal structure and function of the mitral valve prosthesis. 5. The aortic valve is tricuspid. There is moderate calcification of the aortic valve. There is moderate thickening of the aortic valve. Aortic valve regurgitation is mild. Aortic valve sclerosis/calcification is present, without any evidence of aortic stenosis. Aortic regurgitation PHT measures 448 msec. 6. Pulmonic valve regurgitation is moderate. 7. The inferior vena cava is normal in size with greater than 50% respiratory variability, suggesting right atrial pressure of 3 mmHg.  FINDINGS Left Ventricle: Left ventricular ejection fraction, by estimation, is 55 to 60%. Left ventricular ejection fraction by 3D volume is 55 %. The left ventricle has normal function. The left ventricle has no regional wall motion abnormalities. The average left ventricular global longitudinal strain is -16.6 %. The global longitudinal strain is normal. The left ventricular internal cavity size was normal in size. There is no left ventricular hypertrophy. Left ventricular diastolic function could not be evaluated due to mitral valve  replacement. Left ventricular diastolic function could not be evaluated. Elevated left ventricular end-diastolic pressure.  Right Ventricle: The right ventricular size is normal. No increase in right ventricular wall thickness. Right ventricular systolic function is normal. There is normal pulmonary artery systolic pressure. The tricuspid regurgitant velocity is 2.39 m/s, and with an assumed right atrial pressure of 3 mmHg, the estimated right ventricular systolic pressure is 25.8 mmHg.  Left Atrium: Left atrial size was mildly dilated.  Right Atrium: Right atrial size was normal in size.  Pericardium: There is no evidence of pericardial effusion.  Mitral Valve: The mitral valve has been repaired/replaced. No evidence of mitral valve regurgitation. There is a 31 mm Edwards Magna bioprosthetic valve present in the mitral position. Echo findings are consistent with normal structure and function of the mitral valve prosthesis. No evidence of mitral valve stenosis. MV peak gradient, 13.4 mmHg. The mean  mitral valve gradient is 3.0 mmHg.  Tricuspid Valve: The tricuspid valve is normal in structure. Tricuspid valve regurgitation is mild . No evidence of tricuspid stenosis.  Aortic Valve: The aortic valve is tricuspid. There is moderate calcification of the aortic valve. There is moderate thickening of the aortic valve. Aortic valve regurgitation is mild. Aortic regurgitation PHT measures 448 msec. Aortic valve sclerosis/calcification is present, without any evidence of aortic stenosis.  Pulmonic Valve: The pulmonic valve was normal in structure. Pulmonic valve regurgitation is moderate. No evidence of pulmonic stenosis.  Aorta: The aortic root is normal in size and structure.  Venous: The inferior vena cava is normal in size with greater than 50% respiratory variability, suggesting right atrial pressure of 3 mmHg.  IAS/Shunts: No atrial level shunt detected by color flow Doppler.   LEFT  VENTRICLE PLAX 2D LVIDd:         4.66 cm         Diastology LVIDs:         2.85 cm         LV e' medial:    5.33 cm/s LV PW:         1.25 cm         LV E/e' medial:  32.1 LV IVS:        1.00 cm         LV e' lateral:   9.03 cm/s LV E/e' lateral: 18.9  2D Longitudinal Strain 2D Strain GLS  -17.0 % (A2C): 2D Strain GLS  -15.2 % (A3C): 2D Strain GLS  -17.7 % (A4C): 2D Strain GLS  -16.6 % Avg:  3D Volume EF LV 3D EF:    Left ventricul ar ejection fraction by 3D volume is 55 %.  3D Volume EF: 3D EF:        55 % LV EDV:       91 ml LV ESV:       41 ml LV SV:        50 ml  RIGHT VENTRICLE RV Basal diam:  2.04 cm RV Mid diam:    2.97 cm RV S prime:     6.74 cm/s TAPSE (M-mode): 1.8 cm  LEFT ATRIUM             Index LA diam:        5.20 cm 2.96 cm/m LA Vol (A2C):   56.6 ml 32.23 ml/m LA Vol (A4C):   69.6 ml 39.64 ml/m LA Biplane Vol: 68.9 ml 39.24 ml/m AORTIC VALVE AI PHT:      448 msec  AORTA Ao Root diam: 2.35 cm Ao Asc diam:  3.50 cm  MITRAL VALVE                TRICUSPID VALVE MV Area (PHT): 3.17 cm     TR Peak grad:   22.8 mmHg MV Peak grad:  13.4 mmHg    TR Vmax:        239.00 cm/s MV Mean grad:  3.0 mmHg MV Vmax:       1.83 m/s MV Vmean:      74.1 cm/s MV Decel Time: 239 msec MV E velocity: 171.00 cm/s  Armanda Magic MD Electronically signed by Armanda Magic MD Signature Date/Time: 12/08/2021/6:10:39 PM    Final   TEE  ECHO TEE 10/17/2018  Narrative TRANSESOPHOGEAL ECHO REPORT    Patient Name:   Mary Farrell Date of Exam: 10/17/2018 Medical Rec #:  [id number]    Height:  60.0 in Accession #:    [id number]   Weight:       152.7 lb Date of Birth:  07/01/1945   BSA:          1.66 m Patient Age:    72 years     BP:           182/72 mmHg Patient Gender: F            HR:           69 bpm. Exam Location:  Inpatient   Procedure: Transesophageal Echo, Color Doppler, Cardiac Doppler and 3D Echo  Indications:     I05.0 Rheumatic  mitral stenosis  History:         Patient has prior history of Echocardiogram examinations, most recent 10/16/2018. CHF; Arrythmias:Atrial Fibrillation Risk Factors:Hypertension and Sleep Apnea. Rheumatic Mitral Stenosis.  Sonographer:     Irving Burton Senior RDCS Referring Phys:  [id number] [facility] [place] Diagnosing Phys: Donato Schultz MD    PROCEDURE: Consent was requested emergently by emergency room physicain. Patients was under conscious sedation during this procedure. Anesthetic was administered intravenously by performing Physician: of Fentanyl, 5.0mg  of Versed. The transesophogeal probe was passed through the esophogus of the patient. Image quality was excellent. The patient's vital signs; including heart rate, blood pressure, and oxygen saturation; remained stable throughout the procedure. The patient developed no complications during the procedure.  IMPRESSIONS   1. Left ventricular ejection fraction, by visual estimation, is 55 to 60%. The left ventricle has normal function. Normal left ventricular size. There is no left ventricular hypertrophy. 2. Global right ventricle has normal systolic function.The right ventricular size is normal. No increase in right ventricular wall thickness. 3. Pulmonary hypertension. 4. Left atrial size was severely dilated. 5. No LAA thrombus. 6. Right atrial size was normal. 7. The mitral valve is rheumatic. Mild mitral valve regurgitation. Moderate-severe mitral stenosis. moderate mitral valve stenosis. 8. Moderate stenosis by pressure half time 1.2cm squared and mean gradient ( ). PISA radius 1.2cm, with calculated area of 1.2 cm squared. Valve leaflets are thin, pliable, no subvalvular thickening, however surrounding P1 (posterior leaflet) there is a calcified fixed nodule (MAC like appearance). 9. The tricuspid valve is normal in structure. Tricuspid valve regurgitation is mild. 10. The aortic valve is tricuspid Aortic valve regurgitation is  mild by color flow Doppler. Mild aortic valve sclerosis without stenosis. 11. The pulmonic valve was normal in structure. Pulmonic valve regurgitation is not visualized by color flow Doppler. 12. Moderately elevated pulmonary artery systolic pressure. 13. The inferior vena cava is normal in size with greater than 50% respiratory variability, suggesting right atrial pressure of 3 mmHg. 14. Consider stress echo with transmitral gradient measurements. 15. Her moderate to severely elevated pulmonary pressures may indicate along with AFIB and markedly dilated LA along with symptoms, that mitral valve replacement is indicated. 16. However pulmonary pressures are high (60-40mmHg). Although leaflets are thin, pliable and without subvalvular thickening, she does have a calcified density on P1 which would likely increase risk with balloon valvuloplasty. 17. Rheumatic moderate mitral stenosis.  FINDINGS Left Ventricle: Left ventricular ejection fraction, by visual estimation, is 55 to 60%. The left ventricle has normal function. There is no left ventricular hypertrophy. Normal left ventricular size.  Right Ventricle: The right ventricular size is normal. No increase in right ventricular wall thickness. Global RV systolic function is has normal systolic function. The tricuspid regurgitant velocity is 3.86 m/s, and with an assumed right atrial pressure of 3 mmHg,  the estimated right ventricular systolic pressure is moderately elevated at 62.6 mmHg. Findings are consistent with pulmonary hypertension.  Left Atrium: Left atrial size was severely dilated. No LAA thrombus.  Right Atrium: Right atrial size was normal in size  Pericardium: There is no evidence of pericardial effusion.  Mitral Valve: The mitral valve is rheumatic. Moderate-severe mitral valve stenosis by observation. Moderate mitral valve stenosis. Mitral valve area PHT measures 1.18 cm. MV peak gradient, 14.7 mmHg. Mild mitral valve  regurgitation. Moderate stenosis by pressure half time 1.2cm squared and mean gradient ( ). PISA radius 1.2cm, with calculated area of 1.2 cm squared. Valve leaflets are thin, pliable, no subvalvular thickening, however surrounding P1 (posterior leaflet) there is a calcified fixed nodule (MAC like appearance).  Tricuspid Valve: The tricuspid valve is normal in structure. Tricuspid valve regurgitation is mild by color flow Doppler.  Aortic Valve: The aortic valve is tricuspid. Aortic valve regurgitation is mild by color flow Doppler. Mild aortic valve sclerosis is present, with no evidence of aortic valve stenosis.  Pulmonic Valve: The pulmonic valve was normal in structure. Pulmonic valve regurgitation is not visualized by color flow Doppler.  Aorta: The aortic root, ascending aorta and aortic arch are all structurally normal, with no evidence of dilitation or obstruction.  Venous: The inferior vena cava is normal in size with greater than 50% respiratory variability, suggesting right atrial pressure of 3 mmHg.  Shunts: No ventricular septal defect is seen or detected. There is no evidence of an atrial septal defect. No atrial level shunt detected by color flow Doppler.  Additional Comments: Rheumatic moderate mitral stenosis. However pulmonary pressures are high (60-12mmHg). Although leaflets are thin, pliable and without subvalvular thickening, she does have a calcified density on P1 which would likely increase risk with balloon valvuloplasty. Consider stress echo with transmitral gradient measurements. Her moderate to severely elevated pulmonary pressures may indicate along with AFIB and markedly dilated LA along with symptoms, that mitral valve replacement is indicated.  MITRAL VALVE               Normals TRICUSPID VALVE             Normals MV Area (PHT): 1.18 cm            TR Peak grad:   59.6 mmHg MV Peak grad:  14.7 mmHg   4 mmHg  TR Vmax:        388.00 cm/s 288 cm/s MV Mean grad:   8.0 mmHg MV Vmax:       1.92 m/s MV Vmean:      135.0 cm/s MV VTI:        0.61 m MV PHT:        186.00 msec 55 ms MR Peak grad:    14.9 mmHg MR Mean grad:    9.0 mmHg MR Vmax:         193.00 cm/s MR Vmean:        144.0 cm/s MR PISA:         9.05 cm MR PISA Eff ROA: 120 mm MR PISA Radius:  1.20 cm   Donato Schultz MD Electronically signed by Donato Schultz MD Signature Date/Time: 10/17/2018/2:13:43 PM    Final   MONITORS  LONG TERM MONITOR-LIVE TELEMETRY (3-14 DAYS) 12/10/2018  Narrative Max 148 bpm 05:47pm, 10/23 Min 25 bpm 08:55am, 10/28 Avg 62 bpm Rare PACs and PVCs 3% atrial fibrillation/flutter burden 7 pauses, longest 4.3 seconds Symptoms associated with sinus rhythm, atrial flutter, and sinus bradycardia  with pauses  Will Camnitz, MD            EKG:  EKG is not ordered today.    Recent Labs: 11/18/2021: B Natriuretic Peptide 73.9 02/10/2022: Hemoglobin 13.0; Platelets 187 04/16/2022: BUN 14; Creatinine, Ser 0.98; Magnesium 2.4; Potassium 3.9; Sodium 137  Recent Lipid Panel No results found for: "CHOL", "TRIG", "HDL", "CHOLHDL", "VLDL", "LDLCALC", "LDLDIRECT"  Risk Assessment/Calculations:   CHA2DS2-VASc Score = 4   This indicates a 4.8% annual risk of stroke. The patient's score is based upon: CHF History: 0 HTN History: 1 Diabetes History: 0 Stroke History: 0 Vascular Disease History: 0 Age Score: 2 Gender Score: 1   Home Medications   Current Meds  Medication Sig   acetaminophen (TYLENOL) 500 MG tablet Take 500 mg by mouth daily as needed for moderate pain.   ALPRAZolam (XANAX) 0.25 MG tablet Take 0.125-0.25 mg by mouth See admin instructions. Take 0.25 mg at night, may take 0.125 mg dose as needed for anxiety   atorvastatin (LIPITOR) 10 MG tablet Take 10 mg by mouth 2 (two) times a week.   Calcium Citrate-Vitamin D (CALCIUM + D PO) Take 1 tablet by mouth daily.   carvedilol (COREG) 3.125 MG tablet TAKE 1 TABLET BY MOUTH TWICE  DAILY WITH MEALS    cetirizine (ZYRTEC) 10 MG tablet Take 10 mg by mouth at bedtime.    Cholecalciferol (VITAMIN D) 50 MCG (2000 UT) tablet Take 2,000 Units by mouth daily.   conjugated estrogens (PREMARIN) vaginal cream Place 1 Applicatorful vaginally 3 (three) times a week.   cyanocobalamin (VITAMIN B12) 1000 MCG tablet Take 1,000 mcg by mouth daily.   diltiazem (CARDIZEM) 30 MG tablet Take 1 tablet (30 mg total) by mouth 4 (four) times daily as needed (for heart rates greater than 100).   dofetilide (TIKOSYN) 500 MCG capsule TAKE 1 CAPSULE BY MOUTH 2 TIMES DAILY.   dorzolamide-timolol (COSOPT) 22.3-6.8 MG/ML ophthalmic solution Place 1 drop into both eyes 2 (two) times daily.   ELIQUIS 5 MG TABS tablet TAKE 1 TABLET BY MOUTH TWICE  DAILY   fluticasone (FLONASE) 50 MCG/ACT nasal spray Place 1 spray into both nostrils daily.   furosemide (LASIX) 20 MG tablet Take 1 tablet (20 mg total) by mouth daily as needed for fluid. For >3lb/overnight or >5lb/weekly note dose   gabapentin (NEURONTIN) 300 MG capsule Take 300 mg by mouth 3 (three) times daily.   hydrALAZINE (APRESOLINE) 25 MG tablet TAKE 1 TABLET DAILY AS NEEDED FOR BLOOD PRESSURE ABOVE 140   latanoprost (XALATAN) 0.005 % ophthalmic solution Place 1 drop into both eyes at bedtime.    Lidocaine-Glycerin (PREPARATION H EX) Apply 1 Application topically daily as needed (hemorrhoids).   magnesium oxide (MAG-OX) 400 (240 Mg) MG tablet Take 1 tablet (400 mg total) by mouth every other day.   meclizine (ANTIVERT) 25 MG tablet Take 25 mg by mouth as needed for dizziness or nausea.   Multiple Vitamin (MULTIVITAMIN WITH MINERALS) TABS tablet Take 1 tablet by mouth daily.   NON FORMULARY Apply 1 application topically 2 (two) times daily as needed (for eczema). Traimcinolone/[employer] Moist Cream   oxybutynin (DITROPAN-XL) 10 MG 24 hr tablet Take 10 mg by mouth daily.   pantoprazole (PROTONIX) 40 MG tablet Take 40 mg by mouth daily.   polyethylene glycol (MIRALAX / GLYCOLAX)  17 g packet Take 17 g by mouth every other day.   potassium chloride (KLOR-CON) 10 MEQ tablet Take 1 tablet (10 mEq total) by mouth daily  as needed (when you take Lasix).   PRESCRIPTION MEDICATION Inhale into the lungs at bedtime. CPAP   Probiotic Product (PROBIOTIC PO) Take 1 capsule by mouth daily.   sodium chloride (OCEAN) 0.65 % SOLN nasal spray Place 1 spray into both nostrils at bedtime.   valsartan (DIOVAN) 320 MG tablet Take 320 mg by mouth daily.     Review of Systems      All other systems reviewed and are otherwise negative except as noted above.  Physical Exam    VS:  BP 131/66 (BP Location: Left Arm, Patient Position: Sitting, Cuff Size: Large)   Pulse (!) 56   Ht 5' (1.524 m)   Wt 166 lb 11.2 oz (75.6 kg)   SpO2 95%   BMI 32.56 kg/m  , BMI Body mass index is 32.56 kg/m.  Wt Readings from Last 3 Encounters:  06/29/22 166 lb 11.2 oz (75.6 kg)  05/25/22 170 lb (77.1 kg)  04/16/22 172 lb 6.4 oz (78.2 kg)    GEN: Well nourished, overweight,  well developed, in no acute distress. HEENT: normal. Neck: Supple, no JVD, carotid bruits, or masses. Cardiac: RRR, no murmurs, rubs, or gallops. No clubbing, cyanosis, edema.  Radials/PT 2+ and equal bilaterally.  Respiratory:  Respirations regular and unlabored, clear to auscultation bilaterally. GI: Soft, nontender, nondistended. MS: No deformity or atrophy. Skin: Warm and dry, no rash. Neuro:  Strength and sensation are intact. Psych: Normal affect.  Assessment & Plan    Persistent atiral fibrillation s/p ablation / High risk medication [place] / Hypercoagulable state - Maintaining NSR. Continue Tikosyn, Eliquis, Carvedilol. CHA2DS2-VASc Score = 4 [CHF History: 0, HTN History: 1, Diabetes History: 0, Stroke History: 0, Vascular Disease History: 0, Age Score: 2, Gender Score: 1].  Therefore, the patient's annual risk of stroke is 4.8 %.    Denies bleeding complications .  HTN - BP mildly elevated in clinic today 131/86. Home cuff  found to read +5 high systolic. Diltiazem previously discontinued due to bradycardia. Valsartan more recently increased to 320mg  daily. Does use Hdyralazine 25mg  PRN for SBP >140. Feels somewhat "blah" after Valsartan. Adjust Valsartan to 160mg  twice daily. Continue Hydralazine 25mg  PRN for SBP >140.   Continue Carvedilol 3.125mg  BID, would avoid increasing dose due to relative bradycardia. Check in via MyChart message in 1 week. If BP persistently elevated, plan to adjust Hydralazine to 25mg  BID instead of PRN.   MS s/p MVR - Continue SBE prophylaxis. Echo 12/07/21 normal LVEF, mildly elevated right heart pressure, normal function of MV prosthesis, mild AI, moderately leaky pulmonic valve. Continue optimal BP control, as above.   Bilateral carotid stenosis - 09/2018 bilateral 1-39% stenosis.12/08/21 bilateral 1-39% stenosis. Continue Atorvastatin to prevent progression. Repeat duplex 11/2022 already ordered.        Disposition: Follow up in 6 month(s) with Chilton Si, MD or APP.  Signed, Alver Sorrow, NP 06/29/2022, 1:42 PM [facility]

## 2022-06-30 LAB — BASIC METABOLIC PANEL
BUN/Creatinine Ratio: 17 (ref 12–28)
BUN: 16 mg/dL (ref 8–27)
CO2: 27 mmol/L (ref 20–29)
Calcium: 9.7 mg/dL (ref 8.7–10.3)
Chloride: 102 mmol/L (ref 96–106)
Creatinine, Ser: 0.92 mg/dL (ref 0.57–1.00)
Glucose: 93 mg/dL (ref 70–99)
Potassium: 4.7 mmol/L (ref 3.5–5.2)
Sodium: 143 mmol/L (ref 134–144)
eGFR: 64 mL/min/{1.73_m2} (ref >59)

## 2022-07-02 ENCOUNTER — Ambulatory Visit: Payer: Medicare Other | Admitting: Podiatry

## 2022-07-04 ENCOUNTER — Encounter (HOSPITAL_BASED_OUTPATIENT_CLINIC_OR_DEPARTMENT_OTHER): Payer: Self-pay | Admitting: Family

## 2022-07-05 ENCOUNTER — Encounter (HOSPITAL_BASED_OUTPATIENT_CLINIC_OR_DEPARTMENT_OTHER): Payer: Self-pay

## 2022-07-06 MED ORDER — HYDRALAZINE HCL 25 MG PO TABS: 25.0000 mg | ORAL_TABLET | Freq: Two times a day (BID) | ORAL | 3 refills | Status: DC

## 2022-07-06 NOTE — Telephone Encounter (Signed)
BP log as requested  

## 2022-07-09 ENCOUNTER — Telehealth: Payer: Self-pay

## 2022-07-09 NOTE — Telephone Encounter (Signed)
Call to pt reference PREP starting on 07/20/22 Reports recently changing her BP meds which makes her feel poorly. Is in touch with MD office and is tracking blood pressures. Knows how to send messages to MD and encourage to send symptoms to them.  Intake scheduled for 07/15/22 at 230p  Will meet her in the lobby of the [facility]  Confirmed she has my number for call back

## 2022-07-13 MED ORDER — HYDRALAZINE HCL 25 MG PO TABS: 25.0000 mg | ORAL_TABLET | Freq: Two times a day (BID) | ORAL | 3 refills | Status: DC

## 2022-07-13 NOTE — Addendum Note (Signed)
Addended by: Regis Bill B on: 07/13/2022 06:02 PM   Modules accepted: Orders

## 2022-07-15 NOTE — Progress Notes (Signed)
[facility] PREP Evaluation  Patient Details  Name: MAKIYHA OGLE MRN: [id number] Date of Birth: 10/16/1945 Age: 77 y.o. PCP: Daisy Floro, MD  Vitals:   07/15/22 1516  BP: (!) 140/60  Pulse: 64  SpO2: 94%  Weight: 170 lb (77.1 kg)     [facility] Eval - 07/15/22 1500       [facility] "PREP" Location   [facility] "PREP" Location [facility]      Referral    Referring Provider Fenton    Reason for referral Inactivity;Hypertension    Program Start Date 07/20/22   T/Th 230p-345p x 12 wks     Measurement   Waist Circumference 37 inches    Hip Circumference 40.5 inches    Body fat 46.8 percent      Information for Trainer   Goals gain stamina, gain energy and  better mood    Current Exercise None    Orthopedic Concerns Bil foot pain, LBP, arthritis, Left knee hurts when climbing stairs    Pertinent Medical History BPPV, HTN, prediabetes, hx of afib, glaucoma    Current Barriers Will miss one class    Medications that affect exercise Medication causing dizziness/drowsiness      Timed Up and Go (TUGS)   Timed Up and Go Low risk <9 seconds   short strides, no falls in <90 days     Mobility and Daily Activities   I find it easy to walk up or down two or more flights of stairs. 1    I have no trouble taking out the trash. 4    I do housework such as vacuuming and dusting on my own without difficulty. 4    I can easily lift a gallon of milk (8lbs). 4    I can easily walk a mile. 1    I have no trouble reaching into high cupboards or reaching down to pick up something from the floor. 1    I do not have trouble doing out-door work such as Loss adjuster, chartered, raking leaves, or gardening. 1      Mobility and Daily Activities   I feel younger than my age. 4    I feel independent. 1    I feel energetic. 1    I live an active life.  1    I feel strong. 1    I feel healthy. 2    I feel active as other people my age. 1      How fit and strong are you.   Fit and Strong Total Score 27             Past Medical History:  Diagnosis Date   Anxiety 06/29/2012   Aortic regurgitation 08/13/2015   Arthritis    "hands, wrists, back, feet, toes" (06/24/2017)   Atrial flutter (HCC) 09/18/2015   Atypical chest pain 05/13/2016   Chest pain    remote cath in 1996 with NORMAL coronaries noted   CHF (congestive heart failure) (HCC)    Dyspnea 10/30/2010   Essential hypertension 09/05/2013   Exogenous obesity    history of    GERD (gastroesophageal reflux disease)    Glaucoma, both eyes    Headache, migraine    "stopped in my 50's" (09/18/2015)   History of cardiovascular stress test 2007   showing no ischemia   Hypertension    Mitral stenosis and aortic insufficiency 06/23/2010   Mitral stenosis with insufficiency    Mitral stenosis with regurgitation  Murmur    of mitral stenosis and moderate aortic insufficiency       Nausea 05/01/2018   Obesity (BMI 30.0-34.9) 02/13/2021   OSA on CPAP    Palpitations    occasional   Paroxysmal A-fib (HCC) 06/24/2017   Pericardial effusion 09/18/2015   Persistent atrial fibrillation (HCC) 03/10/2017   Personal history of rheumatic heart disease    S/P minimally-invasive maze operation for atrial fibrillation 12/13/2018   Complete bilateral atrial lesion set using cryothermy and bipolar radiofrequency ablation with clipping of LA appendage via right mini-thoracotomy approach   S/P minimally-invasive mitral valve replacement with bioprosthetic valve 12/13/2018   31 mm [facility] Mitral stented bovine pericardial tissue valve   SBE (subacute bacterial endocarditis)    prophylaxis, patient unaware   Sliding hiatal hernia    Stress 02/13/2021   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   Vertigo 05/01/2018   Past Surgical History:  Procedure Laterality Date   ABDOMINAL HYSTERECTOMY  1975   APPENDECTOMY  1977   ATRIAL FIBRILLATION ABLATION N/A 03/10/2017   Procedure: ATRIAL FIBRILLATION ABLATION;  Surgeon: Regan Lemming, MD;  [facility];  Service: Cardiovascular;  Laterality: N/A;   ATRIAL FIBRILLATION ABLATION  06/24/2017   ATRIAL FIBRILLATION ABLATION N/A 06/24/2017   Procedure: ATRIAL FIBRILLATION ABLATION;  Surgeon: Regan Lemming, MD;  Location: [facility];  Service: Cardiovascular;  Laterality: N/A;   ATRIAL FIBRILLATION ABLATION N/A 02/23/2022   Procedure: ATRIAL FIBRILLATION ABLATION;  Surgeon: Regan Lemming, MD;  Location: [facility];  Service: Cardiovascular;  Laterality: N/A;   BUNIONECTOMY Right 2000s   CARDIAC CATHETERIZATION  01/13/1995   normal coronary anatomy and mild mitral stenosis and mild pulmonary hypertension   CARDIOVERSION N/A 09/22/2015   Procedure: CARDIOVERSION;  Surgeon: Jake Bathe, MD;  Location: [facility] ENDOSCOPY;  Service: Cardiovascular;  Laterality: N/A;   CARDIOVERSION N/A 10/25/2016   Procedure: CARDIOVERSION;  Surgeon: Pricilla Riffle, MD;  Location: [facility] ENDOSCOPY;  Service: Cardiovascular;  Laterality: N/A;   CARDIOVERSION N/A 04/15/2017   Procedure: CARDIOVERSION;  Surgeon: Wendall Stade, MD;  Location: [facility] ENDOSCOPY;  Service: Cardiovascular;  Laterality: N/A;   CARDIOVERSION N/A 05/16/2017   Procedure: CARDIOVERSION;  Surgeon: Chrystie Nose, MD;  Location: [facility] ENDOSCOPY;  Service: Cardiovascular;  Laterality: N/A;   CARDIOVERSION N/A 01/24/2019   Procedure: CARDIOVERSION;  Surgeon: Chrystie Nose, MD;  Location: [facility] ENDOSCOPY;  Service: Cardiovascular;  Laterality: N/A;   CARDIOVERSION N/A 10/14/2021   Procedure: CARDIOVERSION;  Surgeon: Little Ishikawa, MD;  Location: [facility] ENDOSCOPY;  Service: Cardiovascular;  Laterality: N/A;   CARDIOVERSION N/A 11/20/2021   Procedure: CARDIOVERSION;  Surgeon: Pricilla Riffle, MD;  Location: [facility] ENDOSCOPY;  Service: Cardiovascular;  Laterality: N/A;   CATARACT EXTRACTION W/ INTRAOCULAR LENS  IMPLANT, BILATERAL Bilateral 2013   COLONOSCOPY  2013   EYE SURGERY Bilateral    cataracts   LAPAROSCOPIC  CHOLECYSTECTOMY  1997   MINIMALLY INVASIVE MAZE PROCEDURE N/A 12/13/2018   Procedure: MINIMALLY INVASIVE MAZE PROCEDURE using a 45 MM AtriClip.;  Surgeon: Purcell Nails, MD;  Location: [facility] OR;  Service: Open Heart Surgery;  Laterality: N/A;   MITRAL VALVE REPLACEMENT Right 12/13/2018   Procedure: MINIMALLY INVASIVE MITRAL VALVE (MV) REPLACEMENT using a Magna Mitral Ease 31 MM Valve.;  Surgeon: Purcell Nails, MD;  Location: [facility] OR;  Service: Open Heart Surgery;  Laterality: Right;   RIGHT/LEFT HEART CATH AND CORONARY ANGIOGRAPHY N/A 10/18/2018   Procedure: RIGHT/LEFT HEART  CATH AND CORONARY ANGIOGRAPHY;  Surgeon: Marykay Lex, MD;  Location: [facility] INVASIVE CV LAB;  Service: Cardiovascular;  Laterality: N/A;   TEE WITHOUT CARDIOVERSION N/A 06/23/2017   Procedure: TRANSESOPHAGEAL ECHOCARDIOGRAM (TEE);  Surgeon: Pricilla Riffle, MD;  Location: [facility] ENDOSCOPY;  Service: Cardiovascular;  Laterality: N/A;   TEE WITHOUT CARDIOVERSION N/A 10/17/2018   Procedure: TRANSESOPHAGEAL ECHOCARDIOGRAM (TEE);  Surgeon: Jake Bathe, MD;  Location: [facility] ENDOSCOPY;  Service: Cardiovascular;  Laterality: N/A;   TEE WITHOUT CARDIOVERSION N/A 12/13/2018   Procedure: TRANSESOPHAGEAL ECHOCARDIOGRAM (TEE);  Surgeon: Purcell Nails, MD;  Location: [facility] OR;  Service: Open Heart Surgery;  Laterality: N/A;   TONSILLECTOMY AND ADENOIDECTOMY  1990s   UVULOPALATOPHARYNGOPLASTY, TONSILLECTOMY AND SEPTOPLASTY  1990s   Social History   Tobacco Use  Smoking Status Never  Smokeless Tobacco Never  Tobacco Comments   Never smoke 09/01/21    Bonnye Fava 07/15/2022, 3:22 PM

## 2022-07-21 ENCOUNTER — Other Ambulatory Visit: Payer: Self-pay | Admitting: *Deleted

## 2022-07-21 MED ORDER — DOFETILIDE 500 MCG PO CAPS: 500.0000 ug | ORAL_CAPSULE | Freq: Two times a day (BID) | ORAL | 3 refills | Status: DC

## 2022-07-21 NOTE — Progress Notes (Signed)
Pt called yesterday not feeling well.  Missed 1st day and will missed 2nd due to husband's MD appt  Will make up missed materials and fit text next week.

## 2022-07-28 NOTE — Progress Notes (Signed)
[facility] PREP Weekly Session  Patient Details  Name: Mary Farrell MRN: [id number] Date of Birth: 02-05-1945 Age: 77 y.o. PCP: Daisy Floro, MD  Vitals:   07/27/22 1430  Weight: 171 lb (77.6 kg)     [facility] Weekly seesion - 07/28/22 0900       [facility] "PREP" Location   [facility] "PREP" Location [facility]      Weekly Session   Topic Discussed Importance of resistance training;Other ways to be active    Minutes exercised this week 90 minutes    Classes attended to date 2             Bonnye Fava 07/28/2022, 9:47 AM

## 2022-08-05 NOTE — Progress Notes (Signed)
[facility] PREP Weekly Session  Patient Details  Name: Mary Farrell MRN: [id number] Date of Birth: 1945/12/31 Age: 77 y.o. PCP: Daisy Floro, MD  Vitals:   08/03/22 1430  Weight: 170 lb 6.4 oz (77.3 kg)     [facility] Weekly seesion - 08/05/22 0900       [facility] "PREP" Location   [facility] "PREP" [facility]      Weekly Session   Topic Discussed Healthy eating tips    Minutes exercised this week 30 minutes    Classes attended to date 3             Pam Jerral Bonito 08/05/2022, 9:01 AM

## 2022-08-10 ENCOUNTER — Telehealth: Payer: Self-pay

## 2022-08-10 NOTE — Telephone Encounter (Signed)
Received text message from pt with request to call her on her home phone Called pt. She reports being very dizzy. Thinks its BPPV which she has had before but not recently. Has not reached out to her PCP but has reached out to a PT who helped her before. She has an appt tomorrow at 2pm.  Recommended she check her blood pressure to be sure it isn't high and to let me know how she is doing.

## 2022-08-13 ENCOUNTER — Telehealth (HOSPITAL_BASED_OUTPATIENT_CLINIC_OR_DEPARTMENT_OTHER): Payer: Self-pay | Admitting: Cardiovascular Disease

## 2022-08-13 NOTE — Telephone Encounter (Signed)
Spoke with patient and reviewed recommendations  She did start on Hydralazine around 6/18 Will monitor blood pressure over the weekend and follow up Monday via mychart

## 2022-08-13 NOTE — Telephone Encounter (Signed)
Would have low suspicion her cardiac medications would cause sneezing, coughing, nausea. She has been on Tikosyn for some time so would be surprised if it was contributing to any symptoms. We did transition Valsartan to 160mg  BID at last clinic visit as she felt a bit "blah" by her description after taking. If she can check BP/HR once per day at least an hour after medications and provide [place] an update Monday that would help. She was having some bradycardia (low heart rate) at her appointment which can contribute to some fatigue.   She talked with Pam at [facility] and noted some dizziness consistent with vertigo. If still experiencing vertigo, recommend OTC Meclizine.   As majority of her symptoms are nonspecific would recommend further follow up with PCP. If she would like to see [place] sooner than scheduled for repeat cardiolgy evaluation we can schedule her sooner OV.  Alver Sorrow, NP

## 2022-08-13 NOTE — Telephone Encounter (Signed)
Spoke with patient regarding symptoms About a week ago she started having sneezing, coughing, nausea, shortness of breath fatigue, and just overall just feeling bad. Denies fever or swelling  Blood pressure 10:30 am 130/43 HR 60, 153/55 HR 58 on retake around 11:40 am Went to walk in clinic week ago, COVID test negative Saw PCP yesterday who recommended calling cardiologist since she was on so many medications, "maybe Tikosyn could be discontinued" PCP recommended changing Zyrtec to Allegra, started today   Advised patient didn't sound cardiac related but would forward to Ronn Melena NP for review

## 2022-08-13 NOTE — Telephone Encounter (Signed)
Pt c/o Shortness Of Breath: STAT if SOB developed within the last 24 hours or pt is noticeably SOB on the phone  1. Are you currently SOB (can you hear that pt is SOB on the phone)?   SOB, comes and goes  2. How long have you been experiencing SOB?   For a while  3. Are you SOB when sitting or when up moving around?  Both sitting and when moving around  4. Are you currently experiencing any other symptoms?   Nausea  Patient stated she is not feeling well and is sick to her stomach.

## 2022-08-16 ENCOUNTER — Encounter (HOSPITAL_BASED_OUTPATIENT_CLINIC_OR_DEPARTMENT_OTHER): Payer: Self-pay

## 2022-08-16 NOTE — Telephone Encounter (Signed)
Patient follow up with her BP from call with [facility]

## 2022-08-16 NOTE — Telephone Encounter (Signed)
No hypotension (low BP) which is good! Glad it seems she was feeling a bit better this morning.   Her BP is a bit higher at home than it was in clinic. Certainly don't want to over-treat BP and cause lightheadedness, fatigue. If she doesn't have any visits upcoming in the next couple of weeks it may be worthwhile to have her bring her blood pressure cuff to a nurse visit to ensure it is accurate. If she is seeing PCP soon, they would be able to check it.   Alver Sorrow, NP

## 2022-08-19 ENCOUNTER — Emergency Department (HOSPITAL_COMMUNITY)
Admission: EM | Admit: 2022-08-19 | Discharge: 2022-08-19 | Disposition: A | Discharge status: Home / Self Care | Payer: Medicare Other | Source: Home / Self Care | Attending: Emergency Medicine | Admitting: Emergency Medicine

## 2022-08-19 ENCOUNTER — Emergency Department (HOSPITAL_COMMUNITY): Payer: Medicare Other

## 2022-08-19 ENCOUNTER — Encounter (HOSPITAL_COMMUNITY): Payer: Self-pay | Admitting: Emergency Medicine

## 2022-08-19 ENCOUNTER — Telehealth: Payer: Self-pay | Admitting: Physician Assistant

## 2022-08-19 DIAGNOSIS — I10 Essential (primary) hypertension: Secondary | ICD-10-CM | POA: Insufficient documentation

## 2022-08-19 DIAGNOSIS — I4892 Unspecified atrial flutter: Secondary | ICD-10-CM | POA: Diagnosis not present

## 2022-08-19 DIAGNOSIS — Z7901 Long term (current) use of anticoagulants: Secondary | ICD-10-CM | POA: Diagnosis not present

## 2022-08-19 DIAGNOSIS — Z79899 Other long term (current) drug therapy: Secondary | ICD-10-CM | POA: Insufficient documentation

## 2022-08-19 DIAGNOSIS — R002 Palpitations: Secondary | ICD-10-CM | POA: Diagnosis present

## 2022-08-19 LAB — CBC
HCT: 43 % (ref 36.0–46.0)
Hemoglobin: 13.6 g/dL (ref 12.0–15.0)
MCH: 29.6 pg (ref 26.0–34.0)
MCHC: 31.6 g/dL (ref 30.0–36.0)
MCV: 93.5 fL (ref 80.0–100.0)
Platelets: 176 10*3/uL (ref 150–400)
RBC: 4.6 MIL/uL (ref 3.87–5.11)
RDW: 15 % (ref 11.5–15.5)
WBC: 6.3 10*3/uL (ref 4.0–10.5)
nRBC: 0 % (ref 0.0–0.2)

## 2022-08-19 LAB — BASIC METABOLIC PANEL
Anion gap: 8 (ref 5–15)
BUN: 11 mg/dL (ref 8–23)
CO2: 26 mmol/L (ref 22–32)
Calcium: 9.4 mg/dL (ref 8.9–10.3)
Chloride: 106 mmol/L (ref 98–111)
Creatinine, Ser: 0.8 mg/dL (ref 0.44–1.00)
GFR, Estimated: >60 mL/min (ref >60)
Glucose, Bld: 102 mg/dL — ABNORMAL HIGH (ref 70–99)
Potassium: 3.8 mmol/L (ref 3.5–5.1)
Sodium: 140 mmol/L (ref 135–145)

## 2022-08-19 LAB — MAGNESIUM: Magnesium: 2.2 mg/dL (ref 1.7–2.4)

## 2022-08-19 LAB — TROPONIN I (HIGH SENSITIVITY): Troponin I (High Sensitivity): 10 ng/L (ref <18)

## 2022-08-19 MED ORDER — PROPOFOL 10 MG/ML IV BOLUS
0.5000 mg/kg | Freq: Once | INTRAVENOUS | Status: AC
  Administered 2022-08-19: 38.5 mg via INTRAVENOUS
  Filled 2022-08-19: qty 20

## 2022-08-19 MED ORDER — DILTIAZEM HCL 25 MG/5ML IV SOLN
15.0000 mg | Freq: Once | INTRAVENOUS | Status: AC
  Administered 2022-08-19: 15 mg via INTRAVENOUS
  Filled 2022-08-19: qty 5

## 2022-08-19 MED ORDER — DILTIAZEM HCL ER COATED BEADS 120 MG PO CP24: 120.0000 mg | ORAL_CAPSULE | Freq: Every day | ORAL | 0 refills | Status: DC

## 2022-08-19 NOTE — Telephone Encounter (Signed)
   The patient called the answering service after-hours early this morning. She has h/o SVT and afib s/p ablation. For the most part since ablation her HR has tended to run in the 50s. However she noticed this morning that her HR was running low 100s. She otherwise feels OK. (I see where she called in on 7/19 with c/o sneezing, coughing, nausea, SOB, fatigue with noromal VS and negative Covid test at that time.) BP is 151/86. She reports most recent HR 108-112bpm. This feels similar to when she had her SVT. She inquires about taking the PRN diltiazem that she had on hand for HR >100. She is also taking carvedilol, Tikosyn, and Eliquis amongst other medications as outlined in [facility]. I told her to go ahead and take the PRN 30mg  diltiazem now and give update of HR in 2 hours. ER precautions reviewed. I will otherwise route to Gillian Shields NP for further review and advisement. The patient verbalized understanding and gratitude.  Laurann Montana, PA-C

## 2022-08-19 NOTE — Telephone Encounter (Signed)
Patient called back to follow-up directly with RN Luther Parody as directed.

## 2022-08-19 NOTE — ED Notes (Signed)
RT called for cardioversion

## 2022-08-19 NOTE — ED Notes (Signed)
MD at bedside pt is not comfortable going home at this time.  Discussion is taking place on other options

## 2022-08-19 NOTE — ED Triage Notes (Signed)
Pt woke up with heart racing. Pt states HR is usually 40-50s. Called on call Cardiology and was told to take 30mg  Diltiazem. Pt took at 740 this morning and HR was staying at 108. PT denies any chest pain but does feel SOB, lightheaded and nauseated. Lightheaded and nausea has been going on for about 1 week

## 2022-08-19 NOTE — Sedation Documentation (Signed)
1405 cardioversion at 150 J

## 2022-08-19 NOTE — ED Notes (Signed)
BSC provided for pt.

## 2022-08-19 NOTE — Discharge Instructions (Addendum)
Follow-up with your cardiologist to be rechecked as we discussed.  Continue your medications as prescribed. Return as needed for worsening symptoms.

## 2022-08-19 NOTE — Telephone Encounter (Signed)
Pt stated she'd like the office to know that she's going to head over to ED. Please advise

## 2022-08-19 NOTE — ED Provider Notes (Addendum)
[place] EMERGENCY DEPARTMENT AT [facility] Provider Note   CSN: [id number] Arrival date & time: 08/19/22  1036     History  Chief Complaint  Patient presents with   Palpitations    Mary Farrell is a 77 y.o. female.   Palpitations    Patient has a history of mitral stenosis, palpitations, hypertension, aortic regurgitation, migraines, atrial flutter, atrial fibrillation.  Patient states she had a prior cardiac ablation.  Her heart rate is usually in the 40s and 50s.  When she woke up this morning she noticed her heart rate was elevated above 100.  Patient does feel slightly short of breath nauseated and lightheaded.  No chest pain.  the symptoms have been ongoing off and on for a week.  Patient states she does take Eliquis.  Home Medications Prior to Admission medications   Medication Sig Start Date End Date Taking? Authorizing Provider  diltiazem (CARDIZEM CD) 120 MG 24 hr capsule Take 1 capsule (120 mg total) by mouth daily. 08/19/22  Yes Linwood Dibbles, MD  acetaminophen (TYLENOL) 500 MG tablet Take 500 mg by mouth daily as needed for moderate pain.    [provider]  ALPRAZolam Prudy Feeler) 0.25 MG tablet Take 0.125-0.25 mg by mouth See admin instructions. Take 0.25 mg at night, may take 0.125 mg dose as needed for anxiety    [provider]  atorvastatin (LIPITOR) 10 MG tablet Take 10 mg by mouth 2 (two) times a week. 10/01/21   [provider]  Calcium Citrate-Vitamin D (CALCIUM + D PO) Take 1 tablet by mouth daily.    [provider]  carvedilol (COREG) 3.125 MG tablet TAKE 1 TABLET BY MOUTH TWICE  DAILY WITH MEALS 02/08/22   Fenton, Clint R, PA  cetirizine (ZYRTEC) 10 MG tablet Take 10 mg by mouth at bedtime.     [provider]  Cholecalciferol (VITAMIN D) 50 MCG (2000 UT) tablet Take 2,000 Units by mouth daily.    [provider]  conjugated estrogens (PREMARIN) vaginal cream Place 1 Applicatorful vaginally 3 (three)  times a week.    [provider]  cyanocobalamin (VITAMIN B12) 1000 MCG tablet Take 1,000 mcg by mouth daily.    [provider]  diltiazem (CARDIZEM) 30 MG tablet Take 1 tablet (30 mg total) by mouth 4 (four) times daily as needed (for heart rates greater than 100). 10/06/21   Camnitz, Andree Coss, MD  dofetilide (TIKOSYN) 500 MCG capsule Take 1 capsule (500 mcg total) by mouth 2 (two) times daily. 07/21/22   Chilton Si, MD  dorzolamide-timolol (COSOPT) 22.3-6.8 MG/ML ophthalmic solution Place 1 drop into both eyes 2 (two) times daily.    [provider]  ELIQUIS 5 MG TABS tablet TAKE 1 TABLET BY MOUTH TWICE  DAILY 09/14/21   Chilton Si, MD  fluticasone [facility]) 50 MCG/ACT nasal spray Place 1 spray into both nostrils daily.    [provider]  furosemide (LASIX) 20 MG tablet Take 1 tablet (20 mg total) by mouth daily as needed for fluid. For >3lb/overnight or >5lb/weekly note dose 11/05/20   Chilton Si, MD  gabapentin (NEURONTIN) 300 MG capsule Take 300 mg by mouth 3 (three) times daily.    [provider]  hydrALAZINE (APRESOLINE) 25 MG tablet Take 1 tablet (25 mg total) by mouth in the morning and at bedtime. 07/13/22   Alver Sorrow, NP  latanoprost (XALATAN) 0.005 % ophthalmic solution Place 1 drop into both eyes at bedtime.  06/17/14   [provider]  Lidocaine-Glycerin (PREPARATION H EX) Apply 1 Application topically daily as needed (hemorrhoids).    [provider]  magnesium oxide (MAG-OX) 400 (240 Mg) MG tablet Take 1 tablet (400 mg total) by mouth every other day. 04/16/22   Fenton, Clint R, PA  meclizine (ANTIVERT) 25 MG tablet Take 25 mg by mouth as needed for dizziness or nausea. 01/05/21   [provider]  Multiple Vitamin (MULTIVITAMIN WITH MINERALS) TABS tablet Take 1 tablet by mouth daily.    [provider]  NON FORMULARY Apply 1 application topically 2 (two) times daily as needed  (for eczema). Traimcinolone/[employer] Moist Cream    [provider]  oxybutynin (DITROPAN-XL) 10 MG 24 hr tablet Take 10 mg by mouth daily. 12/16/15   [provider]  pantoprazole (PROTONIX) 40 MG tablet Take 40 mg by mouth daily.    [provider]  polyethylene glycol (MIRALAX / GLYCOLAX) 17 g packet Take 17 g by mouth every other day.    [provider]  potassium chloride (KLOR-CON) 10 MEQ tablet Take 1 tablet (10 mEq total) by mouth daily as needed (when you take Lasix). 10/06/21   Camnitz, Andree Coss, MD  PRESCRIPTION MEDICATION Inhale into the lungs at bedtime. CPAP    [provider]  Probiotic Product (PROBIOTIC PO) Take 1 capsule by mouth daily.    [provider]  sodium chloride (OCEAN) 0.65 % SOLN nasal spray Place 1 spray into both nostrils at bedtime.    [provider]  valsartan (DIOVAN) 160 MG tablet Take 1 tablet (160 mg total) by mouth 2 (two) times daily. 06/29/22   Alver Sorrow, NP      Allergies    Tizanidine, Amoxicillin, Metoprolol tartrate, and Oxaprozin    Review of Systems   Review of Systems  Cardiovascular:  Positive for palpitations.    Physical Exam Updated Vital Signs BP (!) 144/55   Pulse (!) 55   Temp 98 F (36.7 C) (Oral)   Resp 16   Wt 77 kg   SpO2 93%   BMI 33.15 kg/m  Physical Exam Vitals and nursing note reviewed.  Constitutional:      General: She is not in acute distress.    Appearance: She is well-developed. She is not ill-appearing.  HENT:     Head: Normocephalic and atraumatic.     Right Ear: External ear normal.     Left Ear: External ear normal.  Eyes:     General: No scleral icterus.       Right eye: No discharge.        Left eye: No discharge.     Conjunctiva/sclera: Conjunctivae normal.  Neck:     Trachea: No tracheal deviation.  Cardiovascular:     Rate and Rhythm: Regular rhythm. Tachycardia present.  Pulmonary:     Effort: Pulmonary effort is normal. No  respiratory distress.     Breath sounds: Normal breath sounds. No stridor. No wheezing or rales.  Abdominal:     General: Bowel sounds are normal. There is no distension.     Palpations: Abdomen is soft.     Tenderness: There is no abdominal tenderness. There is no guarding or rebound.  Musculoskeletal:        General: No tenderness or deformity.     Cervical back: Neck supple.  Skin:    General: Skin is warm and dry.     Findings: No rash.  Neurological:  General: No focal deficit present.     Mental Status: She is alert.     Cranial Nerves: No cranial nerve deficit, dysarthria or facial asymmetry.     Sensory: No sensory deficit.     Motor: No abnormal muscle tone or seizure activity.     Coordination: Coordination normal.  Psychiatric:        Mood and Affect: Mood normal.     ED Results / Procedures / Treatments   Labs (all labs ordered are listed, but only abnormal results are displayed) Labs Reviewed  BASIC METABOLIC PANEL - Abnormal; Notable for the following components:      Result Value   Glucose, Bld 102 (*)    All other components within normal limits  CBC  MAGNESIUM  TROPONIN I (HIGH SENSITIVITY)    EKG EKG Interpretation Date/Time:  Thursday August 19 2022 12:28:59 EDT Ventricular Rate:  54 PR Interval:  378 QRS Duration:  94 QT Interval:  453 QTC Calculation: 430 R Axis:   103  Text Interpretation: Sinus or ectopic atrial rhythm Prolonged PR interval Right axis deviation Consider left ventricular hypertrophy Confirmed by Linwood Dibbles [phone]) on 08/19/2022 12:32:53 PM  Radiology DG Chest Port 1 View  Result Date: 08/19/2022 CLINICAL DATA:  Atrial fibrillation.  Shortness of breath. EXAM: PORTABLE CHEST 1 VIEW COMPARISON:  Chest radiographs 01/18/2020 and 03/26/2019; cardiac CT 02/16/2022 FINDINGS: Postsurgical changes are again seen of mitral valve prosthesis and left atrial appendage occlusive device. Cardiac silhouette is again mildly to moderately  enlarged. Mediastinal contours are within limits. Lateral left mid lung horizontal linear scarring is unchanged from multiple prior radiographs and CT. No acute airspace opacity. No pleural effusion or pneumothorax. Mild dextrocurvature of the midthoracic spine with multilevel degenerative disc changes. IMPRESSION: 1. No acute cardiopulmonary process. 2. Unchanged cardiomegaly. Electronically Signed   By: Neita Garnet M.D.   On: 08/19/2022 12:57    Procedures .Sedation  Date/Time: 08/19/2022 1:49 PM  Performed by: Linwood Dibbles, MD Authorized by: Linwood Dibbles, MD   Consent:    Consent obtained:  Verbal   Consent given by:  Patient   Risks discussed:  Inadequate sedation, allergic reaction and prolonged hypoxia resulting in organ damage Universal protocol:    Immediately prior to procedure, a time out was called: yes     Patient identity confirmed:  Verbally with patient Indications:    Procedure performed:  Cardioversion Pre-sedation assessment:    Time since last food or drink:  4   ASA classification: class 2 - patient with mild systemic disease     Mouth opening:  3 or more finger widths   Mallampati score:  I - soft palate, uvula, fauces, pillars visible   Neck mobility: normal     Pre-sedation assessments completed and reviewed: airway patency, cardiovascular function, hydration status, mental status, nausea/vomiting, pain level, respiratory function and temperature     Pre-sedation assessment completed:  08/19/2022 1:49 PM Immediate pre-procedure details:    Reassessment: Patient reassessed immediately prior to procedure     Reviewed: vital signs     Verified: bag valve mask available, emergency equipment available, intubation equipment available, IV patency confirmed, oxygen available, reversal medications available and suction available   Procedure details (see MAR for exact dosages):    Intended level of sedation: deep   Intra-procedure monitoring:  Blood pressure monitoring,  cardiac monitor, continuous capnometry, continuous pulse oximetry, frequent LOC assessments and frequent vital sign checks   Total Provider sedation time (minutes):  10 Post-procedure  details:    Post-sedation assessment completed:  08/19/2022 2:19 PM   Attendance: Constant attendance by certified staff until patient recovered     Recovery: Patient returned to pre-procedure baseline     Post-sedation assessments completed and reviewed: airway patency, cardiovascular function, hydration status, mental status, nausea/vomiting, pain level, respiratory function and temperature     Patient is stable for discharge or admission: yes     Procedure completion:  Tolerated well, no immediate complications .Cardioversion  Date/Time: 08/19/2022 2:10 PM  Performed by: Linwood Dibbles, MD Authorized by: Linwood Dibbles, MD   Consent:    Consent obtained:  Written   Consent given by:  Patient   Alternatives discussed:  No treatment Universal protocol:    Immediately prior to procedure a time out was called: yes     Patient identity confirmed:  Verbally with patient Pre-procedure details:    Cardioversion basis:  Emergent   Rhythm:  Atrial flutter   Electrode placement:  Anterior-posterior Patient sedated: Yes. Refer to sedation procedure documentation for details of sedation.  Attempt one:    Cardioversion mode:  Synchronous   Waveform:  Biphasic   Shock (Joules):  150   Shock outcome:  Conversion to normal sinus rhythm .Critical Care  Performed by: Linwood Dibbles, MD Authorized by: Linwood Dibbles, MD   Critical care provider statement:    Critical care time (minutes):  30   Critical care was time spent personally by me on the following activities:  Development of treatment plan with patient or surrogate, discussions with consultants, evaluation of patient's response to treatment, examination of patient, ordering and review of laboratory studies, ordering and review of radiographic studies, ordering and  performing treatments and interventions, pulse oximetry, re-evaluation of patient's condition and review of old charts     Medications Ordered in ED Medications  diltiazem (CARDIZEM) injection 15 mg (15 mg Intravenous Given 08/19/22 1222)    ED Course/ Medical Decision Making/ A&P Clinical Course as of 08/19/22 1348  Thu Aug 19, 2022  1231 CBC and metabolic panel normal [JK]  1305 Magnesium normal troponin normal [JK]  1310 Heart rate back down into the 80s at this time.  Does appear more irregular and consistent with A-fib [JK]  1314 DG Chest Port 1 View Chest x-ray without acute findings [JK]  1348 Patient's heart rate increased when she walked.  She is concerned about her persistent tachycardia.  Will go ahead and proceed with cardioversion.  Patient has been taking her Eliquis [JK]    Clinical Course User Index [JK] Linwood Dibbles, MD                             Medical Decision Making Problems Addressed: Atrial flutter, unspecified type [facility]): chronic illness or injury with exacerbation, progression, or side effects of treatment  Amount and/or Complexity of Data Reviewed Labs: ordered. Decision-making details documented in ED Course. Radiology: ordered and independent interpretation performed. Decision-making details documented in ED Course.  Risk Prescription drug management.   Patient presented to the ED for evaluations of palpitations tachycardia.  Patient does have history of recurrent issues with atrial flutter or atrial fibrillation.  Patient has had prior ablations and has required cardioversion in the past.  Patient's ED workup does not show any signs of electrolyte abnormalities.  Patient has normal CBC normal magnesium.  Cardiac enzymes are normal.  Patient was given a dose of Cardizem with improvement in her heart rate.  It  seemed like she may have been back in sinus rhythm but at the bedside monitor appears to be more consistent with slow atrial fibrillation at this  time.  Suspect patient has been going in and out of her tacky dysrhythmia.  Currently rate controlled.  Will have her start taking Cardizem daily to help with the tachycardia.  Patient previously had been on this medications.  Will have her follow-up with her cardiologist to be rechecked.  Pt ended up going back into atrial flutter with tacycardia.  Pt requested cardioversion.  Proceed with synchronized cardioversion in the ED.  Pt tolerated well        Final Clinical Impression(s) / ED Diagnoses Final diagnoses:  Atrial flutter, unspecified type (HCC)    Rx / DC Orders ED Discharge Orders          Ordered    diltiazem (CARDIZEM CD) 120 MG 24 hr capsule  Daily        08/19/22 1311    Ambulatory referral to Cardiology       Comments: If you have not heard from the Cardiology office within the next 72 hours please call [phone].   08/19/22 1312              Linwood Dibbles, MD 08/19/22 1316    Linwood Dibbles, MD 08/19/22 1412 CC addendum   Linwood Dibbles, MD 10/12/22 [phone]

## 2022-08-19 NOTE — Telephone Encounter (Signed)
Will monitor ED evaluation. Initial EKG does show atrial flutter, best addressed in ED visit.   Alver Sorrow, NP

## 2022-08-19 NOTE — Telephone Encounter (Signed)
Noted, cardiology to be consulted if needed.

## 2022-08-20 NOTE — Telephone Encounter (Signed)
ED visit 08/20/22 requiring cardioversion. Will reach out to Dr. Elberta Fortis and Jorja Loa, PA at [facility] as may warrant from sooner follow up. Presently scheduled to see EP APP 09/2022.   Alver Sorrow, NP

## 2022-08-25 NOTE — Telephone Encounter (Signed)
Spent close to 20 min on the phone w/ pt. Pt reports she was sick for several weeks before the AFLutter started. States her [employer] Sim[employer] mobile said tachycardia last week  w/ HR 112. Went to ED 7/25 and DCCV performed. Says by Monday she was nauseous again ("this is usually a sign something is wrong w/ my heart"). Last night she felt a "big palpitation that took my breath". Ablation date held for 01/04/23. Pt will keep appt w/ Otilio Saber, PA on 8/22. She will call the office if flutter reoccurs and/or symptoms begin again. Patient verbalized understanding and agreeable to plan.

## 2022-08-26 ENCOUNTER — Encounter: Payer: Self-pay | Admitting: Cardiology

## 2022-08-26 DIAGNOSIS — I4819 Other persistent atrial fibrillation: Secondary | ICD-10-CM

## 2022-08-27 ENCOUNTER — Ambulatory Visit: Payer: Medicare Other | Attending: Family

## 2022-08-27 DIAGNOSIS — I4819 Other persistent atrial fibrillation: Secondary | ICD-10-CM

## 2022-08-27 NOTE — Progress Notes (Unsigned)
Enrolled patient for a 14 day Zio XT monitor to be mailed to patients home  ° °Camnitz to read °

## 2022-08-29 ENCOUNTER — Telehealth: Payer: Self-pay | Admitting: Cardiology

## 2022-08-29 NOTE — Telephone Encounter (Signed)
  Patient with known history of atrial flutter s/p ablation called answering service this morning requesting advice on management of recurrent atrial flutter. Patient was seen in the ED on 08/19/22 with rapid HR and was found to be in atrial flutter. She was able to be rate controlled on IV diltiazem but was then also cardioverted back to sinus rhythm. Patient called answering service last night when she developed recurrent palpitations and tachycardia concerning for atrial flutter per [facility] device. Cardiology moonlighter advised her to take her PRN diltiazem (no note documenting this conversation) Overnight, patient did have improvement in HR into the 60s but then had recurrent palpitations this morning with HR 118. She took a 30 mg diltiazem at 9:30 this morning without significant change in rates. She is currently fatigue but otherwise without symptoms aside from palpitations. BP this morning stable ~117/80s mmHg and I advised her to take a dose of Cardizem CD 120mg  that patient still has a prescription for at home. She may continue to also take 30mg  Diltiazem every 4 hours for up to 2 additional doses if BP is stable. Discussed that if she is still tachycardic this afternoon or develops chest pain, shortness of breath, near syncope, she should present to the ED.   Perlie Gold, PA-C

## 2022-08-30 ENCOUNTER — Telehealth (HOSPITAL_COMMUNITY): Payer: Self-pay | Admitting: *Deleted

## 2022-08-30 MED ORDER — DILTIAZEM HCL ER COATED BEADS 120 MG PO CP24: 120.0000 mg | ORAL_CAPSULE | Freq: Every day | ORAL | Status: DC

## 2022-08-30 NOTE — Telephone Encounter (Signed)
Pt continues in afib. HRs in the 110s. BP 116/60 Discussed with Jorja Loa PA will continue cardizem 120mg  once a day until follow up with [place] PA on 8/22. Pt inquired when she would return to ER for another cardioversion. ER precautions were reviewed with pt.

## 2022-08-31 ENCOUNTER — Telehealth: Payer: Self-pay | Admitting: Cardiology

## 2022-08-31 DIAGNOSIS — I4819 Other persistent atrial fibrillation: Secondary | ICD-10-CM

## 2022-08-31 NOTE — Telephone Encounter (Signed)
Called pt. She already spoke to someone today about this concern. She appreciates the follow up.

## 2022-08-31 NOTE — Telephone Encounter (Signed)
Spoke with patient show states she is now in afib and wants to know if should still wear her cardiac monitor. Advised patient it is still beneficial to wear as it can give a detail report of her heart rhythm over a 2 week period. Patient verbalized understanding.   Confirmed she has an  appointment with afib clinic on 09/16/22.  She denies other symptoms and has no other questions at this time.

## 2022-08-31 NOTE — Telephone Encounter (Signed)
New Message:     Patient said she would like to talk to [facility] please. She said she received her Zio Monitor on Monday. She said she went in to Afib on Saturday and is still in it. She wants to know if she still needs to wear the Monitor?      Patient c/o Palpitations:  STAT if patient reporting lightheadedness, shortness of breath, or chest pain  How long have you had palpitations/irregular HR/ Afib? Are you having the symptoms now? She is in Afib  at  this time, started on Saturday  Are you currently experiencing lightheadedness, SOB or CP? Yes, short of breath   Do you have a history of afib (atrial fibrillation) or irregular heart rhythm?   Have you checked your BP or HR? (document readings if available):   Are you experiencing any other symptoms? Shoulder feel weird, some chest tightness

## 2022-09-01 ENCOUNTER — Telehealth (HOSPITAL_COMMUNITY): Payer: Self-pay | Admitting: *Deleted

## 2022-09-01 MED ORDER — DILTIAZEM HCL ER COATED BEADS 180 MG PO CP24: 180.0000 mg | ORAL_CAPSULE | Freq: Every day | ORAL | Status: DC

## 2022-09-01 NOTE — Telephone Encounter (Signed)
Patient called in today stating she is "exhausted" in afib. HRs on 120mg  of cardizem continue to be 70-120 she is still taking multiple doses of PRN cardizem 30mg  tablets to assist with rate control. She states she is short of breath and has no energy. BP 112/68 HR 121 currently. Discussed with Jorja Loa PA will increase cardizem to 180mg  daily and request follow up with Dr. Elberta Fortis asap to discuss management as pt recently cardioverted with ERAF. Pt notified of recommendations.

## 2022-09-03 ENCOUNTER — Telehealth (HOSPITAL_COMMUNITY): Payer: Self-pay | Admitting: *Deleted

## 2022-09-03 MED ORDER — DILTIAZEM HCL ER COATED BEADS 120 MG PO CP24: 120.0000 mg | ORAL_CAPSULE | Freq: Every day | ORAL | Status: DC

## 2022-09-03 NOTE — Telephone Encounter (Signed)
Patient called in stating she went back into normal rhythm this morning HR 48-50 BP 105/43. Discussed with Jorja Loa PA will decrease cardizem back to 120mg  daily and move to bedtime. Pt in agreement.

## 2022-09-09 ENCOUNTER — Encounter: Payer: Self-pay | Admitting: Cardiology

## 2022-09-09 ENCOUNTER — Ambulatory Visit: Payer: Medicare Other | Admitting: Cardiology

## 2022-09-09 VITALS — BP 146/76 | HR 58 | Ht 60.0 in | Wt 168.6 lb

## 2022-09-09 DIAGNOSIS — I484 Atypical atrial flutter: Secondary | ICD-10-CM

## 2022-09-09 DIAGNOSIS — Z5181 Encounter for therapeutic drug level monitoring: Secondary | ICD-10-CM

## 2022-09-09 DIAGNOSIS — Z79899 Other long term (current) drug therapy: Secondary | ICD-10-CM

## 2022-09-09 DIAGNOSIS — I4819 Other persistent atrial fibrillation: Secondary | ICD-10-CM

## 2022-09-09 DIAGNOSIS — D6869 Other thrombophilia: Secondary | ICD-10-CM | POA: Diagnosis not present

## 2022-09-09 NOTE — Patient Instructions (Signed)
Medication Instructions:  Your physician recommends that you continue on your current medications as directed. Please refer to the Current Medication list given to you today.  *If you need a refill on your cardiac medications before your next appointment, please call your pharmacy*  Lab Work: None ordered today. If you have labs (blood work) drawn today and your tests are completely normal, you will receive your results only by: MyChart Message (if you have MyChart) OR A paper copy in the mail If you have any lab test that is abnormal or we need to change your treatment, we will call you to review the results.  Testing/Procedures: None ordered today.  Follow-Up: At [facility], you and your health needs are our priority.  As part of our continuing mission to provide you with exceptional heart care, we have created designated Provider Care Teams.  These Care Teams include your primary Cardiologist (physician) and Advanced Practice Providers (APPs -  Physician Assistants and Nurse Practitioners) who all work together to provide you with the care you need, when you need it.  Your next appointment:   1 month(s)  The format for your next appointment:   In Person  Provider:   Francis Dowse, PA-C{

## 2022-09-09 NOTE — Progress Notes (Signed)
  Electrophysiology Office Note:   Date:  09/09/2022  ID:  Mary Farrell, DOB 1945-04-08, MRN [id number]  Primary Cardiologist: Mary Si, MD Electrophysiologist: Mary Ross Jorja Loa, MD      History of Present Illness:   Mary Farrell is a 77 y.o. female with h/o atrial fibrillation/flutter seen today for routine electrophysiology followup.  Since last being seen in our clinic the patient reports doing well at times.  She has had episodes of atrial flutter over the past few months.  She did present to the emergency room with atrial flutter, feeling quite poorly.  She had a cardioversion while in the emergency room.  She has had unfortunately continued palpitations.  she denies chest pain, palpitations, dyspnea, PND, orthopnea, nausea, vomiting, dizziness, syncope, edema, weight gain, or early satiety.   Review of systems complete and found to be negative unless listed in HPI.   EP Information / Studies Reviewed:    EKG is ordered today. Personal review as below.  EKG Interpretation Date/Time:  Thursday September 09 2022 14:59:18 EDT Ventricular Rate:  58 PR Interval:  212 QRS Duration:  96 QT Interval:  490 QTC Calculation: 481 R Axis:   98  Text Interpretation: Sinus bradycardia with 1st degree A-V block Rightward axis When compared with ECG of 19-Aug-2022 14:06, No significant change since last tracing Confirmed by Vala Raffo ([id number]) on 09/09/2022 3:12:49 PM     Risk Assessment/Calculations:    CHA2DS2-VASc Score = 4   This indicates a 4.8% annual risk of stroke. The patient's score is based upon: CHF History: 0 HTN History: 1 Diabetes History: 0 Stroke History: 0 Vascular Disease History: 0 Age Score: 2 Gender Score: 1      Physical Exam:   VS:  BP (!) 146/76   Pulse (!) 58   Ht 5' (1.524 m)   Wt 168 lb 9.6 oz (76.5 kg)   SpO2 96%   BMI 32.93 kg/m    Wt Readings from Last 3 Encounters:  09/09/22 168 lb 9.6 oz (76.5 kg)  08/19/22 169 lb 12.1 oz (77 kg)   08/03/22 170 lb 6.4 oz (77.3 kg)     GEN: Well nourished, well developed in no acute distress NECK: No JVD; No carotid bruits CARDIAC: Regular rate and rhythm, no murmurs, rubs, gallops RESPIRATORY:  Clear to auscultation without rales, wheezing or rhonchi  ABDOMEN: Soft, non-tender, non-distended EXTREMITIES:  No edema; No deformity   ASSESSMENT AND PLAN:    1.  Persistent atrial fibrillation: Status post ablation x 2, most recently 02/23/2022.  Currently on dofetilide.  She is unfortunately continued to have episodes of atrial flutter.  She has plans for repeat ablation.  I have offered her switching to amiodarone from dofetilide to see if this Hadi Dubin help improve her arrhythmias.  She would ultimately like to be off of some of her medications.  She may wish to move forward with ablation.  [name].  2.  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  No changes.  Now postop.  3.  Hypertension: Mildly elevated.  Plan per primary physician  4.  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  Stable on most recent echo.  No changes.  5.  Secondary to coag state: Currently on Eliquis for atrial fibrillation  Follow up with EP APP in 4 weeks  Signed, Mary Farrell Jorja Loa, MD

## 2022-09-10 ENCOUNTER — Ambulatory Visit: Payer: Medicare Other | Admitting: Cardiology

## 2022-09-16 ENCOUNTER — Ambulatory Visit: Payer: Medicare Other | Admitting: Student

## 2022-09-21 ENCOUNTER — Other Ambulatory Visit: Payer: Self-pay | Admitting: Medical Genetics

## 2022-09-21 ENCOUNTER — Other Ambulatory Visit: Payer: Self-pay | Admitting: Cardiovascular Disease

## 2022-09-21 DIAGNOSIS — Z006 Encounter for examination for normal comparison and control in clinical research program: Secondary | ICD-10-CM

## 2022-09-21 DIAGNOSIS — I4819 Other persistent atrial fibrillation: Secondary | ICD-10-CM

## 2022-09-21 NOTE — Telephone Encounter (Signed)
Prescription refill request for Eliquis received. Indication: Afib  Last office visit: 09/09/22 ([place])  Scr: 0.80 (08/19/22)  Age: 77 Weight: 76.5kg  Appropriate dose. Refill sent.

## 2022-10-08 ENCOUNTER — Ambulatory Visit
Admission: RE | Admit: 2022-10-08 | Discharge: 2022-10-08 | Disposition: A | Discharge status: Home / Self Care | Payer: Medicare Other | Source: Ambulatory Visit | Attending: Sports Medicine | Admitting: Sports Medicine

## 2022-10-08 ENCOUNTER — Other Ambulatory Visit: Payer: Self-pay | Admitting: Sports Medicine

## 2022-10-08 DIAGNOSIS — M25551 Pain in right hip: Secondary | ICD-10-CM

## 2022-10-11 ENCOUNTER — Other Ambulatory Visit (HOSPITAL_COMMUNITY): Payer: Self-pay | Admitting: *Deleted

## 2022-10-11 MED ORDER — DILTIAZEM HCL ER COATED BEADS 120 MG PO CP24: 120.0000 mg | ORAL_CAPSULE | Freq: Every day | ORAL | 3 refills | Status: DC

## 2022-10-19 ENCOUNTER — Encounter: Payer: Self-pay | Admitting: Physician Assistant

## 2022-10-19 ENCOUNTER — Ambulatory Visit: Payer: Medicare Other | Attending: Physician Assistant | Admitting: Physician Assistant

## 2022-10-19 VITALS — BP 140/62 | HR 60 | Ht 60.0 in | Wt 162.4 lb

## 2022-10-19 DIAGNOSIS — D6869 Other thrombophilia: Secondary | ICD-10-CM | POA: Diagnosis not present

## 2022-10-19 DIAGNOSIS — Z79899 Other long term (current) drug therapy: Secondary | ICD-10-CM | POA: Diagnosis not present

## 2022-10-19 DIAGNOSIS — Z5181 Encounter for therapeutic drug level monitoring: Secondary | ICD-10-CM | POA: Diagnosis not present

## 2022-10-19 DIAGNOSIS — Z952 Presence of prosthetic heart valve: Secondary | ICD-10-CM

## 2022-10-19 DIAGNOSIS — I4819 Other persistent atrial fibrillation: Secondary | ICD-10-CM | POA: Diagnosis not present

## 2022-10-19 DIAGNOSIS — I10 Essential (primary) hypertension: Secondary | ICD-10-CM

## 2022-10-19 NOTE — Patient Instructions (Signed)
Medication Instructions:   Your physician recommends that you continue on your current medications as directed. Please refer to the Current Medication list given to you today.  *If you need a refill on your cardiac medications before your next appointment, please call your pharmacy*   Lab Work:  BMET MAG AND CBC TODAY    If you have labs (blood work) drawn today and your tests are completely normal, you will receive your results only by: MyChart Message (if you have MyChart) OR A paper copy in the mail If you have any lab test that is abnormal or we need to change your treatment, we will call you to review the results.   Testing/Procedures: NONE ORDERED  TODAY     Follow-Up: At [facility], you and your health needs are our priority.  As part of our continuing mission to provide you with exceptional heart care, we have created designated Provider Care Teams.  These Care Teams include your primary Cardiologist (physician) and Advanced Practice Providers (APPs -  Physician Assistants and Nurse Practitioners) who all work together to provide you with the care you need, when you need it.  We recommend signing up for the patient portal called "MyChart".  Sign up information is provided on this After Visit Summary.  MyChart is used to connect with patients for Virtual Visits (Telemedicine).  Patients are able to view lab/test results, encounter notes, upcoming appointments, etc.  Non-urgent messages can be sent to your provider as well.   To learn more about what you can do with MyChart, go to [email or web address].    Your next appointment:   2 month(s) ( CONTACT  CASSIE HALL/ ANGELINE HAMMER FOR EP SCHEDULING ISSUES )   Provider:   Francis Dowse, PA-C    Other Instructions

## 2022-10-19 NOTE — Progress Notes (Signed)
Cardiology Office Note Date:  10/19/2022  Patient ID:  Mary Farrell, Mary Farrell 1945/11/20, MRN [id number] PCP:  Daisy Floro, MD  Cardiologist:  Dr. Duke Salvia Electrophysiologist: Dr. Elberta Fortis    Chief Complaint:   4 week  History of Present Illness: Mary Farrell is a 77 y.o. 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 Elberta Fortis 09/09/22, unfortunately recurrent palpitations, mentions Aflutter, ER visit with DCCV Discussed options Repeat ablation Changing to amiodarone She was going to give these options some thought  Aug 2024 monitor Patient had a min HR of 37 bpm, max HR of 131 bpm, and avg HR of 59 bpm.  Predominant underlying rhythm was Sinus Rhythm.  Atrial Flutter/SVT occurred (13% burden), ranging from 39-131 bpm (avg of 81 bpm), the longest 1 day 20 hours with an avg rate of 81 bpm.  Triggered episodes associated with atrial flutter/SVT Less than 1% ventricular and supraventricular ectopy   TODAY She reports since starting Diltiazem (she reports starting after weaaring the monitor/being in the ER) her AFib/flutter seems to have simmered down She would really like to avoid another procedure as well as amiodarone  When not in AF she feels well No CP, SOB No near syncope or syncope Reports good medication compliance No bleeding or signs of bleeding  AFib/AAD Amiodarone not tolerated 2/2 GI  Flecainide 2017 >> failed and had PR prolongation PVI ablation 03/10/2017 CTI/PVI Ablation May 2019 MAZE 2020 Tikosyn Jan 2021 PVI ablation 02/23/22  Past Medical History:  Diagnosis Date   Anxiety 06/29/2012   Aortic regurgitation 08/13/2015   Arthritis    "hands, wrists, back, feet,  toes" (06/24/2017)   Atrial flutter (HCC) 09/18/2015   Atypical chest pain 05/13/2016   Chest pain    remote cath in 1996 with NORMAL coronaries noted   CHF (congestive heart failure) (HCC)    Dyspnea 10/30/2010   Essential hypertension 09/05/2013   Exogenous obesity    history of    GERD (gastroesophageal reflux disease)    Glaucoma, both eyes    Headache, migraine    "stopped in my 50's" (09/18/2015)   History of cardiovascular stress test 2007   showing no ischemia   Hypertension    Mitral stenosis and aortic insufficiency 06/23/2010   Mitral stenosis with insufficiency    Mitral stenosis with regurgitation    Murmur    of mitral stenosis and moderate aortic insufficiency       Nausea 05/01/2018   Obesity (BMI 30.0-34.9) 02/13/2021   OSA on CPAP    Palpitations    occasional   Paroxysmal A-fib (HCC) 06/24/2017   Pericardial effusion 09/18/2015   Persistent atrial fibrillation (HCC) 03/10/2017   Personal history of rheumatic heart disease    S/P minimally-invasive maze operation for atrial fibrillation 12/13/2018   Complete bilateral atrial lesion set using cryothermy and bipolar radiofrequency ablation with clipping of LA appendage via right mini-thoracotomy approach   S/P minimally-invasive mitral valve replacement with bioprosthetic valve 12/13/2018   31 mm [facility] Mitral stented bovine pericardial tissue valve   SBE (subacute bacterial endocarditis)    prophylaxis, patient unaware   Sliding hiatal hernia    Stress 02/13/2021   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   Vertigo 05/01/2018    Past Surgical History:  Procedure Laterality Date   ABDOMINAL HYSTERECTOMY  1975   APPENDECTOMY  1977   ATRIAL FIBRILLATION ABLATION N/A 03/10/2017   Procedure: ATRIAL FIBRILLATION ABLATION;  Surgeon: Regan Lemming, MD;  Location: [facility] INVASIVE CV LAB;  Service: Cardiovascular;  Laterality: N/A;   ATRIAL FIBRILLATION ABLATION  06/24/2017   ATRIAL FIBRILLATION ABLATION N/A  06/24/2017   Procedure: ATRIAL FIBRILLATION ABLATION;  Surgeon: Regan Lemming, MD;  Location: [facility] INVASIVE CV LAB;  Service: Cardiovascular;  Laterality: N/A;   ATRIAL FIBRILLATION ABLATION N/A 02/23/2022   Procedure: ATRIAL FIBRILLATION ABLATION;  Surgeon: Regan Lemming, MD;  Location: [facility] INVAS[facility] CV LAB;  Service: Cardiovascular;  Laterality: N/A;   BUNIONECTOMY Right 2000s   CARDIAC CATHETERIZATION  01/13/1995   normal coronary anatomy and mild mitral stenosis and mild pulmonary hypertension   CARDIOVERSION N/A 09/22/2015   Procedure: CARDIOVERSION;  Surgeon: Jake Bathe, MD;  Location[facility] ENDOSCOPY;  Service: Cardiovascular;  Laterality: N/A;   CARDIOVERSION N/A 10/25/2016   Procedure: CARDIOVERSION;  Surgeon: Pricilla Riffle, MD;  Location: [facility] ENDOSCOPY;  Service: Cardiovascular;  Laterality: N/A;   CARDIOVERSION N/A 04/15/2017   Procedure: CARDIOVERSION;  Surgeon: Wendall Stade, MD;  Location: [facility] ENDOSCOPY;  Service: Cardiovascular;  Laterality: N/A;   CARDIOVERSION N/A 05/16/2017   Procedure: CARDIOVERSION;  Surgeon: Chrystie Nose, MD;  Location: [facility] ENDOSCOPY;  Service: Cardiovascular;  Laterality: N/A;   CARDIOVERSION N/A 01/24/2019   Procedure: CARDIOVERSION;  Surgeon: Chrystie Nose, MD;  Location: [facility] ENDOSCOPY;  Service: Cardiovascular;  Laterality: N/A;   CARDIOVERSION N/A 10/14/2021   Procedure: CARDIOVERSION;  Surgeon: Little Ishikawa, MD;  Location: [facility] ENDOSCOPY;  Service: Cardiovascular;  Laterality: N/A;   CARDIOVERSION N/A 11/20/2021   Procedure: CARDIOVERSION;  Surgeon: Pricilla Riffle, MD;  Location: [facility] ENDOSCOPY;  Service: Cardiovascular;  Laterality: N/A;   CATARACT EXTRACTION W/ INTRAOCULAR LENS  IMPLANT, BILATERAL Bilateral 2013   COLONOSCOPY  2013   EYE SURGERY Bilateral    cataracts   LAPAROSCOPIC CHOLECYSTECTOMY  1997   MINIMALLY INVASIVE MAZE PROCEDURE N/A 12/13/2018   Procedure: MINIMALLY INVASIVE MAZE PROCEDURE using a 45 MM AtriClip.;   Surgeon: Purcell Nails, MD;  Location: [facility] OR;  Service: Open Heart Surgery;  Laterality: N/A;   MITRAL VALVE REPLACEMENT Right 12/13/2018   Procedure: MINIMALLY INVASIVE MITRAL VALVE (MV) REPLACEMENT using a Magna Mitral Ease 31 MM Valve.;  Surgeon: Purcell Nails, MD;  Location: [facility] OR;  Service: Open Heart Surgery;  Laterality: Right;   RIGHT/LEFT HEART CATH AND CORONARY ANGIOGRAPHY N/A 10/18/2018   Procedure: RIGHT/LEFT HEART CATH AND CORONARY ANGIOGRAPHY;  Surgeon: Marykay Lex, MD;  Location: [facility] INVASIVE CV LAB;  Service: Cardiovascular;  Laterality: N/A;   TEE WITHOUT CARDIOVERSION N/A 06/23/2017   Procedure: TRANSESOPHAGEAL ECHOCARDIOGRAM (TEE);  Surgeon: Pricilla Riffle, MD;  Location: [facility] ENDOSCOPY;  Service: Cardiovascular;  Laterality: N/A;   TEE WITHOUT CARDIOVERSION N/A 10/17/2018   Procedure: TRANSESOPHAGEAL ECHOCARDIOGRAM (TEE);  Surgeon: Jake Bathe, MD;  Location: [facility] ENDOSCOPY;  Service: Cardiovascular;  Laterality: N/A;   TEE WITHOUT CARDIOVERSION N/A 12/13/2018   Procedure: TRANSESOPHAGEAL ECHOCARDIOGRAM (TEE);  Surgeon: Purcell Nails, MD;  Location: [facility] OR;  Service: Open Heart Surgery;  Laterality: N/A;   TONSILLECTOMY AND ADENOIDECTOMY  1990s   UVULOPALATOPHARYNGOPLASTY, TONSILLECTOMY AND SEPTOPLASTY  1990s    Current Outpatient Medications  Medication Sig Dispense Refill   acetaminophen (TYLENOL) 500 MG tablet Take 500  mg by mouth daily as needed for moderate pain.     ALPRAZolam (XANAX) 0.25 MG tablet Take 0.125-0.25 mg by mouth See admin instructions. Take 0.25 mg at night, may take 0.125 mg dose as needed for anxiety     atorvastatin (LIPITOR) 10 MG tablet Take 10 mg by mouth 2 (two) times a week.     Calcium Citrate-Vitamin D (CALCIUM + D PO) Take 1 tablet by mouth daily.     carvedilol (COREG) 3.125 MG tablet TAKE 1 TABLET BY MOUTH TWICE  DAILY WITH MEALS 180 tablet 3   cetirizine (ZYRTEC) 10 MG tablet Take 10 mg by mouth at bedtime.      Cholecalciferol  (VITAMIN D) 50 MCG (2000 UT) tablet Take 2,000 Units by mouth daily.     conjugated estrogens (PREMARIN) vaginal cream Place 1 Applicatorful vaginally 3 (three) times a week.     cyanocobalamin (VITAMIN B12) 1000 MCG tablet Take 1,000 mcg by mouth daily.     diltiazem (CARDIZEM CD) 120 MG 24 hr capsule Take 1 capsule (120 mg total) by mouth at bedtime. 30 capsule 3   diltiazem (CARDIZEM) 30 MG tablet Take 1 tablet (30 mg total) by mouth 4 (four) times daily as needed (for heart rates greater than 100). 60 tablet 3   dofetilide (TIKOSYN) 500 MCG capsule Take 1 capsule (500 mcg total) by mouth 2 (two) times daily. 180 capsule 3   dorzolamide-timolol (COSOPT) 22.3-6.8 MG/ML ophthalmic solution Place 1 drop into both eyes 2 (two) times daily.     ELIQUIS 5 MG TABS tablet TAKE 1 TABLET BY MOUTH TWICE  DAILY 180 tablet 3   fluticasone (FLONASE) 50 MCG/ACT nasal spray Place 1 spray into both nostrils daily.     furosemide (LASIX) 20 MG tablet Take 1 tablet (20 mg total) by mouth daily as needed for fluid. For >3lb/overnight or >5lb/weekly note dose 30 tablet 4   gabapentin (NEURONTIN) 300 MG capsule Take 300 mg by mouth 3 (three) times daily.     hydrALAZINE (APRESOLINE) 25 MG tablet Take 1 tablet (25 mg total) by mouth in the morning and at bedtime. 180 tablet 3   latanoprost (XALATAN) 0.005 % ophthalmic solution Place 1 drop into both eyes at bedtime.   6   Lidocaine-Glycerin (PREPARATION H EX) Apply 1 Application topically daily as needed (hemorrhoids).     magnesium oxide (MAG-OX) 400 (240 Mg) MG tablet Take 1 tablet (400 mg total) by mouth every other day.     meclizine (ANTIVERT) 25 MG tablet Take 25 mg by mouth as needed for dizziness or nausea.     Multiple Vitamin (MULTIVITAMIN WITH MINERALS) TABS tablet Take 1 tablet by mouth daily.     NON FORMULARY Apply 1 application topically 2 (two) times daily as needed (for eczema). Traimcinolone/[employer] Moist Cream     oxybutynin (DITROPAN-XL) 10 MG 24 hr  tablet Take 10 mg by mouth daily.     pantoprazole (PROTONIX) 40 MG tablet Take 40 mg by mouth daily.     polyethylene glycol (MIRALAX / GLYCOLAX) 17 g packet Take 17 g by mouth every other day.     potassium chloride (KLOR-CON) 10 MEQ tablet Take 1 tablet (10 mEq total) by mouth daily as needed (when you take Lasix). 30 tablet 3   PRESCRIPTION MEDICATION Inhale into the lungs at bedtime. CPAP     Probiotic Product (PROBIOTIC PO) Take 1 capsule by mouth daily.     sodium chloride (OCEAN) 0.65 % SOLN nasal  spray Place 1 spray into both nostrils at bedtime.     valsartan (DIOVAN) 160 MG tablet Take 1 tablet (160 mg total) by mouth 2 (two) times daily. 180 tablet 1   No current facility-administered medications for this visit.    Allergies:   Tizanidine, Amoxicillin, Metoprolol tartrate, and Oxaprozin   Social History:  The patient  reports that she has never smoked. She has never used smokeless tobacco. She reports current alcohol use. She reports that she does not use drugs.   Family History:  The patient's family history includes Diabetes in her brother; Heart attack in her mother; Heart failure in her maternal grandfather; Hypertension in her brother; Kidney disease in her brother.  ROS:  Please see the history of present illness.    All other systems are reviewed and otherwise negative.   PHYSICAL EXAM:  VS:  There were no vitals taken for this visit. BMI: There is no height or weight on file to calculate BMI. Well nourished, well developed, in no acute distress HEENT: normocephalic, atraumatic Neck: no JVD, carotid bruits or masses Cardiac:  RRR; bradycardic, no significant murmurs, no rubs, or gallops Lungs: CTA b/l, no wheezing, rhonchi or rales Abd: soft, nontender MS: no deformity or atrophy Ext: no edema Skin: warm and dry, no rash Neuro:  No gross deficits appreciated Psych: euthymic mood, full affect   EKG:  Done today and reviewed by myself shows  SR 60bpm, QTc    12/07/21: TTE 1. Left ventricular ejection fraction, by estimation, is 55 to 60%. Left  ventricular ejection fraction by 3D volume is 55 %. The left ventricle has  normal function. The left ventricle has no regional wall motion  abnormalities. Left ventricular diastolic   function could not be evaluated. Elevated left ventricular end-diastolic  pressure. The average left ventricular global longitudinal strain is -16.6  %. The global longitudinal strain is normal.   2. Right ventricular systolic function is normal. The right ventricular  size is normal. There is normal pulmonary artery systolic pressure. The  estimated right ventricular systolic pressure is 25.8 mmHg.   3. Left atrial size was mildly dilated.   4. The mitral valve has been repaired/replaced. No evidence of mitral  valve regurgitation. No evidence of mitral stenosis. The mean mitral valve  gradient is 3.0 mmHg at a HR of 52bpm. Echo findings are consistent with  normal structure and function of  the mitral valve prosthesis.   5. The aortic valve is tricuspid. There is moderate calcification of the  aortic valve. There is moderate thickening of the aortic valve. Aortic  valve regurgitation is mild. Aortic valve sclerosis/calcification is  present, without any evidence of aortic  stenosis. Aortic regurgitation PHT measures 448 msec.   6. Pulmonic valve regurgitation is moderate.   7. The inferior vena cava is normal in size with greater than 50%  respiratory variability, suggesting right atrial pressure of 3 mmHg.    09/24/20: TTE IMPRESSIONS   1. Left ventricular ejection fraction, by estimation, is 60 to 65%. The  left ventricle has normal function. The left ventricle has no regional  wall motion abnormalities. Left ventricular diastolic parameters are  indeterminate.   2. Right ventricular systolic function is normal. The right ventricular  size is normal. There is moderately elevated pulmonary artery  systolic  pressure.   3. Left atrial size was moderately dilated.   4. The mitral valve has been repaired/replaced. Mild mitral valve  regurgitation. No evidence of mitral stenosis.  The mean mitral valve  gradient is 2.8 mmHg with average heart rate of 55 bpm. Echo findings are  consistent with normal structure and  function of the mitral valve prosthesis.   5. The aortic valve is tricuspid. There is mild calcification of the  aortic valve. There is mild thickening of the aortic valve. Aortic valve  regurgitation is mild. Mild aortic valve sclerosis is present, with no  evidence of aortic valve stenosis.  Aortic valve area, by VTI measures 2.29 cm. Aortic valve mean gradient  measures 6.5 mmHg. Aortic valve Vmax measures 1.69 m/s.   6. The inferior vena cava is normal in size with greater than 50%  respiratory variability, suggesting right atrial pressure of 3 mmHg.    TTE 01/23/19: 1. Left ventricular ejection fraction, by visual estimation, is 55 to  60%. The left ventricle has normal function. Left ventricular septal wall  thickness was mildly increased. Moderately increased left ventricular  posterior wall thickness. There is  mildly increased left ventricular hypertrophy.   2. Left ventricular diastolic parameters are indeterminate.   3. The left ventricle demonstrates regional wall motion abnormalities.   4. Elevated LVEDP. Hypokinesis of the basal septum consistent with  post-operative state.   5. Global right ventricle has normal systolic function.The right  ventricular size is normal. No increase in right ventricular wall  thickness.   6. Left atrial size was severely dilated.   7. Right atrial size was normal.   8. The mitral valve has been repaired/replaced. No evidence of mitral  valve regurgitation. No evidence of mitral stenosis.   9. The tricuspid valve is normal in structure.  10. The aortic valve is tricuspid. Aortic valve regurgitation is mild. No  evidence of  aortic valve sclerosis or stenosis.  11. The pulmonic valve was normal in structure. Pulmonic valve  regurgitation is not visualized.  12. Normal pulmonary artery systolic pressure.  13. The inferior vena cava is normal in size with <50% respiratory  variability, suggesting right atrial pressure of 8 mmHg.      10/18/2018: [facility] Hemodynamic findings consistent with mild pulmonary hypertension. LV end diastolic pressure is normal. There is severe mitral valve stenosis -suggested by a echocardiogram, large PCWP V waves along with elevated PCWP but normal LVEDP would also suggest this. Angiographically normal coronary arteries   SUMMARY Angiographically normal coronary arteries Mild pulmonary hypertension with mildly elevated PCWP but normal LVEDP suggestive of mitral stenosis Significant V wave noted on PCWP waveform suggesting mitral stenosis.   RECOMMENDATIONS Return to nursing unit for ongoing care.   Proceed with plans for mitral valve replacement   06/24/2017 EPS/ablation CONCLUSIONS: 1. Sinus rhythm upon presentation.   2. Successful electrical isolation and anatomical encircling of all four pulmonary veins with radiofrequency current.    3. Cavo-tricuspid isthmus ablation was performed with complete bidirectional isthmus block achieved.  4. No inducible arrhythmias following ablation both on and off of dobutamine 5. No early apparent complications.   03/10/2017: EPS/ablation CONCLUSIONS: 1. Atrial fibrillation upon presentation.   2. Successful electrical isolation and anatomical encircling of all four pulmonary veins with radiofrequency current.  A WACA approach was used 3. Additional left atrial ablation was performed with a left atrial roof line  4. Atrial fibrillation successfully cardioverted to sinus rhythm. 5. No early apparent complications.     Recent Labs: 11/18/2021: B Natriuretic Peptide 73.9 08/19/2022: BUN 11; Creatinine, Ser 0.80; Hemoglobin 13.6; Magnesium  2.2; Platelets 176; Potassium 3.8; Sodium 140  No results found for  requested labs within last 365 days.   CrCl cannot be calculated (Patient's most recent lab result is older than the maximum 21 days allowed.).   Wt Readings from Last 3 Encounters:  09/09/22 168 lb 9.6 oz (76.5 kg)  08/19/22 169 lb 12.1 oz (77 kg)  08/03/22 170 lb 6.4 oz (77.3 kg)     Other studies reviewed: Additional studies/records reviewed today include: summarized above  ASSESSMENT AND PLAN:  1. Persistent AFib 2. SVT 3. AFlutter CHA2DS2Vasc is 4, on Eliquis, appropriately dosed     On Tikosyn, w/stable QTc   Improved  burden w/dit add on Will try to avoid mor meds (amiodarone) and ablation No changes today Tikosyn labs Back in a couple months again, sooner if needed  4. MVR (bioprosthetic) 5. Chronic pericardial effusion (trivial by last echo 2022)     Stable by last echo 2022     C/w Dr. Duke Salvia  6. HTN     No changes today  7. Secondary hypercoagulable state    Disposition: as above  Current medicines are reviewed at length with the patient today.  The patient did not have any concerns regarding medicines.  Norma Fredrickson, PA-C 10/19/2022 7:49 AM     [facility] [address] [place] [address] [phone] (office)  [phone] (fax)

## 2022-10-20 LAB — CBC
Hematocrit: 39 % (ref 34.0–46.6)
Hemoglobin: 12.5 g/dL (ref 11.1–15.9)
MCH: 29.9 pg (ref 26.6–33.0)
MCHC: 32.1 g/dL (ref 31.5–35.7)
MCV: 93 fL (ref 79–97)
Platelets: 192 10*3/uL (ref 150–450)
RBC: 4.18 x10E6/uL (ref 3.77–5.28)
RDW: 14 % (ref 11.7–15.4)
WBC: 7.3 10*3/uL (ref 3.4–10.8)

## 2022-10-20 LAB — BASIC METABOLIC PANEL
BUN/Creatinine Ratio: 19 (ref 12–28)
BUN: 14 mg/dL (ref 8–27)
CO2: 23 mmol/L (ref 20–29)
Calcium: 9.1 mg/dL (ref 8.7–10.3)
Chloride: 104 mmol/L (ref 96–106)
Creatinine, Ser: 0.75 mg/dL (ref 0.57–1.00)
Glucose: 99 mg/dL (ref 70–99)
Potassium: 4 mmol/L (ref 3.5–5.2)
Sodium: 140 mmol/L (ref 134–144)
eGFR: 81 mL/min/{1.73_m2} (ref >59)

## 2022-10-20 LAB — MAGNESIUM: Magnesium: 2.1 mg/dL (ref 1.6–2.3)

## 2022-10-26 ENCOUNTER — Encounter (HOSPITAL_BASED_OUTPATIENT_CLINIC_OR_DEPARTMENT_OTHER): Payer: Self-pay

## 2022-10-29 ENCOUNTER — Other Ambulatory Visit (HOSPITAL_BASED_OUTPATIENT_CLINIC_OR_DEPARTMENT_OTHER): Payer: Self-pay

## 2022-10-29 ENCOUNTER — Ambulatory Visit (HOSPITAL_COMMUNITY)
Admission: RE | Admit: 2022-10-29 | Discharge: 2022-10-29 | Disposition: A | Discharge status: Home / Self Care | Payer: Medicare Other | Source: Ambulatory Visit | Attending: Family | Admitting: Family

## 2022-10-29 DIAGNOSIS — I4819 Other persistent atrial fibrillation: Secondary | ICD-10-CM | POA: Insufficient documentation

## 2022-10-29 DIAGNOSIS — I6523 Occlusion and stenosis of bilateral carotid arteries: Secondary | ICD-10-CM | POA: Diagnosis present

## 2022-10-29 DIAGNOSIS — I10 Essential (primary) hypertension: Secondary | ICD-10-CM | POA: Insufficient documentation

## 2022-11-22 ENCOUNTER — Other Ambulatory Visit (HOSPITAL_COMMUNITY): Payer: Medicare Other

## 2022-11-26 ENCOUNTER — Other Ambulatory Visit (HOSPITAL_BASED_OUTPATIENT_CLINIC_OR_DEPARTMENT_OTHER): Payer: Self-pay | Admitting: Family

## 2022-11-26 DIAGNOSIS — I10 Essential (primary) hypertension: Secondary | ICD-10-CM

## 2022-12-05 ENCOUNTER — Emergency Department (HOSPITAL_COMMUNITY): Payer: Medicare Other

## 2022-12-05 ENCOUNTER — Emergency Department (HOSPITAL_COMMUNITY)
Admission: EM | Admit: 2022-12-05 | Discharge: 2022-12-05 | Disposition: A | Discharge status: Home / Self Care | Payer: Medicare Other | Attending: Emergency Medicine | Admitting: Emergency Medicine

## 2022-12-05 DIAGNOSIS — Z79899 Other long term (current) drug therapy: Secondary | ICD-10-CM | POA: Insufficient documentation

## 2022-12-05 DIAGNOSIS — I110 Hypertensive heart disease with heart failure: Secondary | ICD-10-CM | POA: Diagnosis not present

## 2022-12-05 DIAGNOSIS — S0990XA Unspecified injury of head, initial encounter: Secondary | ICD-10-CM | POA: Insufficient documentation

## 2022-12-05 DIAGNOSIS — Z7901 Long term (current) use of anticoagulants: Secondary | ICD-10-CM | POA: Diagnosis not present

## 2022-12-05 DIAGNOSIS — I509 Heart failure, unspecified: Secondary | ICD-10-CM | POA: Insufficient documentation

## 2022-12-05 DIAGNOSIS — W208XXA Other cause of strike by thrown, projected or falling object, initial encounter: Secondary | ICD-10-CM | POA: Insufficient documentation

## 2022-12-05 NOTE — Discharge Instructions (Addendum)
CT scan without concern.  Follow-up with your PCP as needed.  Return for any emergent symptoms.

## 2022-12-05 NOTE — ED Triage Notes (Signed)
Pt reports on eliquis for afib, was at family house when a tv antenna fell on pt head about 3 feet, pt reports it was not heavy but wanted to get checked out. Pt denies HA, LOC, dizziness or n/v, or vision changes

## 2022-12-05 NOTE — ED Provider Notes (Signed)
[place] EMERGENCY DEPARTMENT AT [facility] Provider Note   CSN: [id number] Arrival date & time: 12/05/22  2004     History  Chief Complaint  Patient presents with   Head Injury    Mary Farrell is a 77 y.o. female.  77 year old female presents today for concern of head injury.  She states she was sitting underneath a TV and an antenna hit her head.  Since then she has been nauseous.  No loss of consciousness.  She is on anticoagulation.  She attributes the nausea to the cake she had at a birthday party.  She states it is [birth year] tomorrow.  The history is provided by the patient. No language interpreter was used.       Home Medications Prior to Admission medications   Medication Sig Start Date End Date Taking? Authorizing Provider  acetaminophen (TYLENOL) 500 MG tablet Take 500 mg by mouth daily as needed for moderate pain.    [provider]  ALPRAZolam Prudy Feeler) 0.25 MG tablet Take 0.125-0.25 mg by mouth See admin instructions. Take 0.25 mg at night, may take 0.125 mg dose as needed for anxiety    [provider]  atorvastatin (LIPITOR) 10 MG tablet Take 10 mg by mouth 2 (two) times a week. 10/01/21   [provider]  Calcium Citrate-Vitamin D (CALCIUM + D PO) Take 1 tablet by mouth daily.    [provider]  carvedilol (COREG) 3.125 MG tablet TAKE 1 TABLET BY MOUTH TWICE  DAILY WITH MEALS 02/08/22   Fenton, Clint R, PA  cetirizine (ZYRTEC) 10 MG tablet Take 10 mg by mouth at bedtime.     [provider]  Cholecalciferol (VITAMIN D) 50 MCG (2000 UT) tablet Take 2,000 Units by mouth daily.    [provider]  conjugated estrogens (PREMARIN) vaginal cream Place 1 Applicatorful vaginally 3 (three) times a week.    [provider]  cyanocobalamin (VITAMIN B12) 1000 MCG tablet Take 1,000 mcg by mouth daily.    [provider]  diltiazem (CARDIZEM CD) 120 MG 24 hr capsule Take 1 capsule (120 mg  total) by mouth at bedtime. Patient taking differently: Take 120 mg by mouth at bedtime. At 6 pm 10/11/22   Fenton, Clint R, PA  diltiazem (CARDIZEM) 30 MG tablet Take 1 tablet (30 mg total) by mouth 4 (four) times daily as needed (for heart rates greater than 100). 10/06/21   Camnitz, Andree Coss, MD  dofetilide (TIKOSYN) 500 MCG capsule Take 1 capsule (500 mcg total) by mouth 2 (two) times daily. 07/21/22   Chilton Si, MD  dorzolamide-timolol (COSOPT) 22.3-6.8 MG/ML ophthalmic solution Place 1 drop into both eyes 2 (two) times daily.    [provider]  ELIQUIS 5 MG TABS tablet TAKE 1 TABLET BY MOUTH TWICE  DAILY 09/21/22   Camnitz, Will Daphine Deutscher, MD  fluticasone (FLONASE) 50 MCG/ACT nasal spray Place 1 spray into both nostrils daily.    [provider]  furosemide (LASIX) 20 MG tablet Take 1 tablet (20 mg total) by mouth daily as needed for fluid. For >3lb/overnight or >5lb/weekly note dose 11/05/20   Chilton Si, MD  gabapentin (NEURONTIN) 300 MG capsule Take 300 mg by mouth 3 (three) times daily.    [provider]  hydrALAZINE (APRESOLINE) 25 MG tablet Take 1 tablet (25 mg total) by mouth in the morning and at bedtime. 07/13/22   Alver Sorrow, NP  latanoprost (XALATAN) 0.005 % ophthalmic solution Place 1  drop into both eyes at bedtime.  06/17/14   [provider]  Lidocaine-Glycerin (PREPARATION H EX) Apply 1 Application topically daily as needed (hemorrhoids).    [provider]  magnesium oxide (MAG-OX) 400 (240 Mg) MG tablet Take 1 tablet (400 mg total) by mouth every other day. 04/16/22   Fenton, Clint R, PA  meclizine (ANTIVERT) 25 MG tablet Take 25 mg by mouth as needed for dizziness or nausea. 01/05/21   [provider]  Multiple Vitamin (MULTIVITAMIN WITH MINERALS) TABS tablet Take 1 tablet by mouth daily.    [provider]  NON FORMULARY Apply 1 application topically 2 (two) times daily as needed (for eczema).  Traimcinolone/[facility] Moist Cream    [provider]  oxybutynin (DITROPAN-XL) 10 MG 24 hr tablet Take 10 mg by mouth daily. 12/16/15   [provider]  pantoprazole (PROTONIX) 40 MG tablet Take 40 mg by mouth daily.    [provider]  polyethylene glycol (MIRALAX / GLYCOLAX) 17 g packet Take 17 g by mouth every other day.    [provider]  potassium chloride (KLOR-CON) 10 MEQ tablet Take 1 tablet (10 mEq total) by mouth daily as needed (when you take Lasix). 10/06/21   Camnitz, Andree Coss, MD  PRESCRIPTION MEDICATION Inhale into the lungs at bedtime. CPAP    [provider]  Probiotic Product (PROBIOTIC PO) Take 1 capsule by mouth daily.    [provider]  sodium chloride (OCEAN) 0.65 % SOLN nasal spray Place 1 spray into both nostrils at bedtime.    [provider]  valsartan (DIOVAN) 160 MG tablet TAKE 1 TABLET BY MOUTH TWICE  DAILY 11/26/22   Alver Sorrow, NP      Allergies    Tizanidine, Amoxicillin, Metoprolol tartrate, and Oxaprozin    Review of Systems   Review of Systems  Constitutional:  Negative for chills and fever.  Eyes:  Negative for visual disturbance.  Gastrointestinal:  Positive for nausea. Negative for abdominal pain and vomiting.  Neurological:  Negative for light-headedness.  All other systems reviewed and are negative.   Physical Exam Updated Vital Signs BP (!) 172/55 (BP Location: Right Arm)   Pulse 73   Temp 98.3 F (36.8 C) (Oral)   Resp 16   SpO2 97%  Physical Exam Vitals and nursing note reviewed.  Constitutional:      General: She is not in acute distress.    Appearance: Normal appearance. She is not ill-appearing.  HENT:     Head: Normocephalic and atraumatic.     Nose: Nose normal.  Eyes:     General: No scleral icterus.    Extraocular Movements: Extraocular movements intact.     Conjunctiva/sclera: Conjunctivae normal.  Cardiovascular:     Rate and Rhythm: Normal rate and  regular rhythm.     Heart sounds: Normal heart sounds.  Pulmonary:     Effort: Pulmonary effort is normal. No respiratory distress.     Breath sounds: Normal breath sounds. No wheezing or rales.  Musculoskeletal:        General: Normal range of motion.     Cervical back: Normal range of motion.  Skin:    General: Skin is warm and dry.  Neurological:     General: No focal deficit present.     Mental Status: She is alert and oriented to person, place, and time. Mental status is at baseline.     Cranial Nerves: No cranial nerve deficit.  Motor: No weakness.     ED Results / Procedures / Treatments   Labs (all labs ordered are listed, but only abnormal results are displayed) Labs Reviewed - No data to display  EKG None  Radiology No results found.  Procedures Procedures    Medications Ordered in ED Medications - No data to display  ED Course/ Medical Decision Making/ A&P                                 Medical Decision Making Amount and/or Complexity of Data Reviewed Radiology: ordered.    Medical Decision Making / ED Course   This patient presents to the ED for concern of head injury, this involves an extensive number of treatment options, and is a complaint that carries with it a high risk of complications and morbidity.  The differential diagnosis includes intracranial bleed, concussion, posttraumatic headache  MDM: 77 year old female presents today for concern of head injury.  She had an antenna fall on her head.  She reports nausea but states that may be related to birthday cake that she had.  No other complaints.  She would like a CT scan as she is concerned about head trauma.  CT head ordered.  Neurological exam without focal deficits.  CT head without acute concerns.  Discharged in stable condition.   Additional history obtained: -Additional history obtained from spouse who was at bedside -External records from outside source obtained and reviewed  including: Chart review including previous notes, labs, imaging, consultation notes   Lab Tests: -I ordered, reviewed, and interpreted labs.   The pertinent results include:   Labs Reviewed - No data to display    EKG  EKG Interpretation Date/Time:    Ventricular Rate:    PR Interval:    QRS Duration:    QT Interval:    QTC Calculation:   R Axis:      Text Interpretation:           Imaging Studies ordered: I ordered imaging studies including CT head without contrast I independently visualized and interpreted imaging. I agree with the radiologist interpretation   Medicines ordered and prescription drug management: No orders of the defined types were placed in this encounter.   -I have reviewed the patients home medicines and have made adjustments as needed   Reevaluation: After the interventions noted above, I reevaluated the patient and found that they have :stayed the same  Co morbidities that complicate the patient evaluation  Past Medical History:  Diagnosis Date   Anxiety 06/29/2012   Aortic regurgitation 08/13/2015   Arthritis    "hands, wrists, back, feet, toes" (06/24/2017)   Atrial flutter (HCC) 09/18/2015   Atypical chest pain 05/13/2016   Chest pain    remote cath in 1996 with NORMAL coronaries noted   CHF (congestive heart failure) (HCC)    Dyspnea 10/30/2010   Essential hypertension 09/05/2013   Exogenous obesity    history of    GERD (gastroesophageal reflux disease)    Glaucoma, both eyes    Headache, migraine    "stopped in my 50's" (09/18/2015)   History of cardiovascular stress test 2007   showing no ischemia   Hypertension    Mitral stenosis and aortic insufficiency 06/23/2010   Mitral stenosis with insufficiency    Mitral stenosis with regurgitation    Murmur    of mitral stenosis and moderate aortic insufficiency  Nausea 05/01/2018   Obesity (BMI 30.0-34.9) 02/13/2021   OSA on CPAP    Palpitations    occasional   Paroxysmal A-fib  (HCC) 06/24/2017   Pericardial effusion 09/18/2015   Persistent atrial fibrillation (HCC) 03/10/2017   Personal history of rheumatic heart disease    S/P minimally-invasive maze operation for atrial fibrillation 12/13/2018   Complete bilateral atrial lesion set using cryothermy and bipolar radiofrequency ablation with clipping of LA appendage via right mini-thoracotomy approach   S/P minimally-invasive mitral valve replacement with bioprosthetic valve 12/13/2018   31 mm [employer] Mitral stented bovine pericardial tissue valve   SBE (subacute bacterial endocarditis)    prophylaxis, patient unaware   Sliding hiatal hernia    Stress 02/13/2021   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  Return precaution discussed.  Final Clinical Impression(s) / ED Diagnoses Final diagnoses:  Injury of head, initial encounter    Rx / DC Orders ED Discharge Orders     None         Marita Kansas, PA-C 12/05/22 2247    Alvira Monday, MD 12/06/22 2218
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
## 2022-12-20 ENCOUNTER — Ambulatory Visit: Payer: Medicare Other | Attending: Physician Assistant | Admitting: Physician Assistant

## 2022-12-20 ENCOUNTER — Encounter: Payer: Self-pay | Admitting: Physician Assistant

## 2022-12-20 VITALS — BP 128/64 | HR 56 | Ht 60.0 in | Wt 159.0 lb

## 2022-12-20 DIAGNOSIS — Z952 Presence of prosthetic heart valve: Secondary | ICD-10-CM | POA: Diagnosis not present

## 2022-12-20 DIAGNOSIS — I471 Supraventricular tachycardia, unspecified: Secondary | ICD-10-CM

## 2022-12-20 DIAGNOSIS — Z5181 Encounter for therapeutic drug level monitoring: Secondary | ICD-10-CM

## 2022-12-20 DIAGNOSIS — R079 Chest pain, unspecified: Secondary | ICD-10-CM

## 2022-12-20 DIAGNOSIS — R0600 Dyspnea, unspecified: Secondary | ICD-10-CM

## 2022-12-20 DIAGNOSIS — Z79899 Other long term (current) drug therapy: Secondary | ICD-10-CM | POA: Diagnosis not present

## 2022-12-20 DIAGNOSIS — I10 Essential (primary) hypertension: Secondary | ICD-10-CM

## 2022-12-20 DIAGNOSIS — I4819 Other persistent atrial fibrillation: Secondary | ICD-10-CM

## 2022-12-20 NOTE — Patient Instructions (Signed)
Medication Instructions:  Your physician recommends that you continue on your current medications as directed. Please refer to the Current Medication list given to you today.  *If you need a refill on your cardiac medications before your next appointment, please call your pharmacy*   Lab Work: TODAY:  BMET, CBC, & MAG  If you have labs (blood work) drawn today and your tests are completely normal, you will receive your results only by: MyChart Message (if you have MyChart) OR A paper copy in the mail If you have any lab test that is abnormal or we need to change your treatment, we will call you to review the results.   Testing/Procedures: None ordered   Follow-Up: At [facility], you and your health needs are our priority.  As part of our continuing mission to provide you with exceptional heart care, we have created designated Provider Care Teams.  These Care Teams include your primary Cardiologist (physician) and Advanced Practice Providers (APPs -  Physician Assistants and Nurse Practitioners) who all work together to provide you with the care you need, when you need it.  We recommend signing up for the patient portal called "MyChart".  Sign up information is provided on this After Visit Summary.  MyChart is used to connect with patients for Virtual Visits (Telemedicine).  Patients are able to view lab/test results, encounter notes, upcoming appointments, etc.  Non-urgent messages can be sent to your provider as well.   To learn more about what you can do with MyChart, go to [email or web address].    Your next appointment:   3 month(s)  Provider:   You may see Will Jorja Loa, MD or one of the following Advanced Practice Providers on your designated Care Team:   Francis Dowse, [place]     Other Instructions

## 2022-12-21 LAB — BASIC METABOLIC PANEL
BUN/Creatinine Ratio: 26 (ref 12–28)
BUN: 21 mg/dL (ref 8–27)
CO2: 27 mmol/L (ref 20–29)
Calcium: 9.2 mg/dL (ref 8.7–10.3)
Chloride: 99 mmol/L (ref 96–106)
Creatinine, Ser: 0.82 mg/dL (ref 0.57–1.00)
Glucose: 89 mg/dL (ref 70–99)
Potassium: 4.2 mmol/L (ref 3.5–5.2)
Sodium: 139 mmol/L (ref 134–144)
eGFR: 73 mL/min/{1.73_m2} (ref >59)

## 2022-12-21 LAB — CBC
Hematocrit: 41.8 % (ref 34.0–46.6)
Hemoglobin: 13.7 g/dL (ref 11.1–15.9)
MCH: 29.9 pg (ref 26.6–33.0)
MCHC: 32.8 g/dL (ref 31.5–35.7)
MCV: 91 fL (ref 79–97)
Platelets: 255 10*3/uL (ref 150–450)
RBC: 4.58 x10E6/uL (ref 3.77–5.28)
RDW: 14.2 % (ref 11.7–15.4)
WBC: 12 10*3/uL — ABNORMAL HIGH (ref 3.4–10.8)

## 2022-12-21 LAB — MAGNESIUM: Magnesium: 2.3 mg/dL (ref 1.6–2.3)

## 2022-12-31 ENCOUNTER — Encounter (HOSPITAL_BASED_OUTPATIENT_CLINIC_OR_DEPARTMENT_OTHER): Payer: Self-pay | Admitting: Cardiovascular Disease

## 2022-12-31 ENCOUNTER — Ambulatory Visit (HOSPITAL_BASED_OUTPATIENT_CLINIC_OR_DEPARTMENT_OTHER): Payer: Medicare Other | Admitting: Cardiovascular Disease

## 2022-12-31 VITALS — BP 132/58 | HR 56 | Ht 60.0 in | Wt 160.1 lb

## 2022-12-31 DIAGNOSIS — I4819 Other persistent atrial fibrillation: Secondary | ICD-10-CM

## 2022-12-31 DIAGNOSIS — I351 Nonrheumatic aortic (valve) insufficiency: Secondary | ICD-10-CM | POA: Diagnosis not present

## 2022-12-31 DIAGNOSIS — I471 Supraventricular tachycardia, unspecified: Secondary | ICD-10-CM

## 2022-12-31 DIAGNOSIS — I4892 Unspecified atrial flutter: Secondary | ICD-10-CM

## 2022-12-31 DIAGNOSIS — I10 Essential (primary) hypertension: Secondary | ICD-10-CM | POA: Diagnosis not present

## 2022-12-31 DIAGNOSIS — I3139 Other pericardial effusion (noninflammatory): Secondary | ICD-10-CM | POA: Diagnosis not present

## 2022-12-31 DIAGNOSIS — E66811 Obesity, class 1: Secondary | ICD-10-CM

## 2022-12-31 DIAGNOSIS — Z5181 Encounter for therapeutic drug level monitoring: Secondary | ICD-10-CM

## 2022-12-31 MED ORDER — EMPAGLIFLOZIN 10 MG PO TABS: 10.0000 mg | ORAL_TABLET | Freq: Every day | ORAL | Status: DC

## 2022-12-31 NOTE — Progress Notes (Signed)
Cardiology Office Note:  .   Date:  12/31/2022  ID:  Mary Farrell, DOB [birth year], MRN [id number] PCP: Mary Floro, MD  [facility] Providers Cardiologist:  Mary Si, MD Electrophysiologist:  Mary Jorja Loa, MD    History of Present Illness: Mary Farrell is a 77 y.o. female with moderate aortic regurgitation, Rheumatic mitral stenosis s/p MVR, chronic pericardial effusion, paroxysmal atrial flutter s/p ablation 05/2017, MAZE 11/2018 and hypertension who presents for follow up.  Mary Farrell was previously a patient of Dr. Patty Farrell.  She followed up with Mary Farrell on 04/2015 due to shortness of breath that she felt was getting worse.  She was referred for an echo 04/2015 that revealed LVEF 55-60% with moderate AR and mild-moderate MS.  There was also a moderate pericardial effusion but no evidence of tamponade.  She had a heart cath 12/1994 that revealed normal coronaries with mild MS and mild pulmonary hypertension.  She also had a Cardiolite in 2007 that was negative for ischemia.  She was seen in clinic 07/2015 and reported persistent shortness of breath with minimal exertion and chest discomfort.  She was referred for exercise Myoview 08/26/15 that revealed LVEF 60% and no perfusion defects.    Mary Farrell was admitted 08/2015 with atrial flutter.  She had an echo that showed a persistent moderate-severe pericardial effusion but no tamponade.  She was started on amiodarone, which she did not tolerate due to nausea.  She underwent DCCV on 09/22/15 and then was started on flecainide.  She continued to report shortness of breath and was again referred for an echo that revealed LVEF 60-65% with grade 2 diastolic dysfunction, mild aortic regurgitation, and moderate mitral stenosis. She had a moderate pericardial effusion localized to the inferior and inferolateral walls. There was no evidence of tamponade.   Mary Farrell had an outpatient DCCV on 10/1 and converted with one shock at  200J.  She developed recurrent atrial fibrillation and was referred to EP.  She underwent afib/flutter ablation with Dr. Elberta Farrell on 06/24/17. She was started on flecainide but had recurent disease and was started on Tikosyn.  Her mitral valve was replaced 11/2018 (31 mm [facility] bioprosthetic).  She had a MAZE at that time. She later was Dr. Elberta Farrell and was in a junctional rhythm so diltiazem was discontinued.  She had recurrent episode of atrial fibrillation and took a dose of diltiazem she subsequently converted back to sinus rhythm.   Mary Farrell blood pressure was elevated so she switched from losartan to valsartan.  She also had chest pain that was thought to be atypical, especially in setting of normal coronaries prior to her surgery.  It was thought that her symptoms may be related to GERD and her PPI was increased.  Hydralazine was added for blood pressures over 140.  She saw Dr. Elberta Farrell 04/2020 and carvedilol was reduced due to bradycardia and fatigue.  Later that month she followed up with Mary Skiff, PA for episodes of persistent tachycardia in the 140s.  She reviewed a Kardia mobile strip and was found to be in atrial flutter.  Carvedilol was increased to 9.375 mg twice daily.  She had a cardioversion 09/2021 and ablation 01/2022.  She saw Dr. Elberta Farrell and was maintaining sinus rhythm.  She is bradycardic and fatigued so diltiazem was discontinued.  She saw Mary Shields, NP 06/2022.  Losartan was switched to 160 mg twice daily due to mildly elevated blood pressure.  She saw Dr.  Camnitz 08/2022 and reported a few episodes of atrial flutter.  They discussed switching from dofetilide to amiodarone as well as repeat ablation attempt.  When she last saw EP clinic 11/2022 she continued to have fatigue but had not had any recent atrial fibrillation or flutter.  Mary Farrell presents with concerns about her heart health. She reports occasional episodes of elevated heart rate, reaching up to the 80s from  her usual 50s, particularly at night. However, she denies any rhythm disturbances during these episodes.  She also reports an incident of near syncope while shopping, which was associated with a recorded blood pressure of 155/79. She describes feeling faint and weak in the legs, requiring her to rest her head on a shopping cart. She attributes this episode to bending over to pick up an item from a lower shelf and possibly being hungry at the time. Despite this episode, her blood pressure readings have generally been within the 110s to 130s range.  She describes having to pant for breath when in a supine position. She also reports a need to take Lasix about once a week, with noted improvement in her weight and breathing following its use.  Ms. Mary Farrell also mentions having a hip injection recently, but she has not noticed significant improvement in her hip pain since the procedure. She describes difficulty in raising her leg to put on socks and shoes due to the pain.  She also expresses concerns about a potential genetic predisposition to heart problems, given her family history of heart disease. She has signed up for a gene connect test to explore this further.  Her overall mood appears positive, with a notable reduction in stress following her son's successful reintegration into society after prison. However, she expresses worry about her son's long driving hours for his job.      ROS:  As per HPI  Studies Reviewed: .       Echo 11/2021:  1. Left ventricular ejection fraction, by estimation, is 55 to 60%. Left  ventricular ejection fraction by 3D volume is 55 %. The left ventricle has  normal function. The left ventricle has no regional wall motion  abnormalities. Left ventricular diastolic   function could not be evaluated. Elevated left ventricular end-diastolic  pressure. The average left ventricular global longitudinal strain is -16.6  %. The global longitudinal strain is normal.   2.  Right ventricular systolic function is normal. The right ventricular  size is normal. There is normal pulmonary artery systolic pressure. The  estimated right ventricular systolic pressure is 25.8 mmHg.   3. Left atrial size was mildly dilated.   4. The mitral valve has been repaired/replaced. No evidence of mitral  valve regurgitation. No evidence of mitral stenosis. The mean mitral valve  gradient is 3.0 mmHg at a HR of 52bpm. Echo findings are consistent with  normal structure and function of  the mitral valve prosthesis.   5. The aortic valve is tricuspid. There is moderate calcification of the  aortic valve. There is moderate thickening of the aortic valve. Aortic  valve regurgitation is mild. Aortic valve sclerosis/calcification is  present, without any evidence of aortic  stenosis. Aortic regurgitation PHT measures 448 msec.   6. Pulmonic valve regurgitation is moderate.   7. The inferior vena cava is normal in size with greater than 50%  respiratory variability, suggesting right atrial pressure of 3 mmHg.   Risk Assessment/Calculations:    CHA2DS2-VASc Score = 4   This indicates a 4.8% annual  risk of stroke. The patient's score is based upon: CHF History: 0 HTN History: 1 Diabetes History: 0 Stroke History: 0 Vascular Disease History: 0 Age Score: 2 Gender Score: 1            Physical Exam:   VS:  BP (!) 132/58   Pulse (!) 56   Ht 5' (1.524 m)   Wt 160 lb 1.6 oz (72.6 kg)   SpO2 96%   BMI 31.27 kg/m  , BMI Body mass index is 31.27 kg/m. GENERAL:  Well appearing HEENT: Pupils equal round and reactive, fundi not visualized, oral mucosa unremarkable NECK:  No jugular venous distention, waveform within normal limits, carotid upstroke brisk and symmetric, no bruits, no thyromegaly LUNGS:  Clear to auscultation bilaterally HEART:  RRR.  PMI not displaced or sustained,S1 and S2 within normal limits, no S3, no S4, no clicks, no rubs, no murmurs ABD:  Flat, positive  bowel sounds normal in frequency in pitch, no bruits, no rebound, no guarding, no midline pulsatile mass, no hepatomegaly, no splenomegaly EXT:  2 plus pulses throughout, no edema, no cyanosis no clubbing SKIN:  No rashes no nodules NEURO:  Cranial nerves II through XII grossly intact, motor grossly intact throughout PSYCH:  Cognitively intact, oriented to person place and time   ASSESSMENT AND PLAN: .    # Hypertension Blood pressure readings mostly within target range, with one episode of elevated blood pressure associated with near syncope. Discussed the risk of over-aggressive treatment leading to hypotension. -Continue current antihypertensive regimen (Carvedilol, Diltiazem, Hydralazine, Valsartan).  # Persistent Atrial Fibrillation No recent episodes of arrhythmia, but occasional episodes of increased heart rate. Currently managed with Carvedilol, Diltiazem, and Tikosyn. -Continue current regimen. -Continue Eliquis.  # HFpEF: Reports of shortness of breath and need for weekly Lasix. Discussed potential benefits of adding Jardiance or Farxiga to improve symptoms and heart muscle relaxation. -Trial of Jardiance 10mg  daily for one month. If beneficial, continue and reevaluate in six months.  General Health Maintenance -Repeat echocardiogram in one year to monitor valve function. -Participation in genetic study at patient's discretion.       Signed, Mary Si, MD

## 2022-12-31 NOTE — Patient Instructions (Signed)
Medication Instructions:  START JARDIANCE 10 MG DAILY  ONCE YOU HAVE TAKEN FOR 3 WEEKS LET us KNOW IF YOU WANT TO CONTINUE AND WILL SEND RX TO MAIL ORDER  *If you need a refill on your cardiac medications before your next appointment, please call your pharmacy*  Lab Work: BMET IN 2 WEEKS   If you have labs (blood work) drawn today and your tests are completely normal, you will receive your results only by: MyChart Message (if you have MyChart) OR A paper copy in the mail If you have any lab test that is abnormal or we need to change your treatment, we will call you to review the results.  Testing/Procedures: Your physician has requested that you have an echocardiogram. Echocardiography is a painless test that uses sound waves to create images of your heart. It provides your doctor with information about the size and shape of your heart and how well your heart's chambers and valves are working. This procedure takes approximately one hour. There are no restrictions for this procedure. Please do NOT wear cologne, perfume, aftershave, or lotions (deodorant is allowed). Please arrive 15 minutes prior to your appointment time.  Please note: We ask at that you not bring children with you during ultrasound (echo/ vascular) testing. Due to room size and safety concerns, children are not allowed in the ultrasound rooms during exams. Our front office staff cannot provide observation of children in our lobby area while testing is being conducted. An adult accompanying a patient to their appointment will only be allowed in the ultrasound room at the discretion of the ultrasound technician under special circumstances. We apologize for any inconvenience. GET SCHEDULED WHEN YOU RETURN IN 6 MONTHS   Follow-Up: At [facility], you and your health needs are our priority.  As part of our continuing mission to provide you with exceptional heart care, we have created designated Provider Care Teams.  These  Care Teams include your primary Cardiologist (physician) and Advanced Practice Providers (APPs -  Physician Assistants and Nurse Practitioners) who all work together to provide you with the care you need, when you need it.  We recommend signing up for the patient portal called "MyChart".  Sign up information is provided on this After Visit Summary.  MyChart is used to connect with patients for Virtual Visits (Telemedicine).  Patients are able to view lab/test results, encounter notes, upcoming appointments, etc.  Non-urgent messages can be sent to your provider as well.   To learn more about what you can do with MyChart, go to [email or web address].    Your next appointment:   6 month(s)  Provider:   Gillian Shields, NP   1 YEAR WITH [facility]

## 2023-01-03 ENCOUNTER — Other Ambulatory Visit (HOSPITAL_COMMUNITY)
Admission: RE | Admit: 2023-01-03 | Discharge: 2023-01-03 | Disposition: A | Discharge status: Home / Self Care | Payer: Self-pay | Source: Ambulatory Visit | Attending: Medical Genetics | Admitting: Medical Genetics

## 2023-01-03 DIAGNOSIS — Z006 Encounter for examination for normal comparison and control in clinical research program: Secondary | ICD-10-CM | POA: Insufficient documentation

## 2023-01-04 ENCOUNTER — Encounter (HOSPITAL_BASED_OUTPATIENT_CLINIC_OR_DEPARTMENT_OTHER): Payer: Self-pay | Admitting: Cardiovascular Disease

## 2023-01-04 MED ORDER — FLUCONAZOLE 150 MG PO TABS: 150.0000 mg | ORAL_TABLET | Freq: Every day | ORAL | 0 refills | Status: DC

## 2023-01-11 LAB — GENECONNECT MOLECULAR SCREEN: Genetic Analysis Overall Interpretation: NEGATIVE

## 2023-01-12 ENCOUNTER — Encounter (HOSPITAL_BASED_OUTPATIENT_CLINIC_OR_DEPARTMENT_OTHER): Payer: Self-pay

## 2023-01-13 ENCOUNTER — Encounter (HOSPITAL_BASED_OUTPATIENT_CLINIC_OR_DEPARTMENT_OTHER): Payer: Self-pay | Admitting: Cardiovascular Disease

## 2023-01-13 NOTE — Telephone Encounter (Signed)
Hi Ula Couvillon,   I should add that I do feel better (most of the time). I just seem to have a higher heart rate - even during the day. Since Dr. Duke Salvia said it helped to relax my heart, it really makes me want to take it. I want anything that can help.   Thanks for your help. Almyra Deforest   I plan on going to [facility] Monday for the lab work.  Above from different [employer]

## 2023-01-13 NOTE — Telephone Encounter (Signed)
See 12/18 mychart message

## 2023-01-16 ENCOUNTER — Telehealth: Payer: Self-pay | Admitting: Cardiology

## 2023-01-16 NOTE — Telephone Encounter (Signed)
   Patient called after hours answering service today for advice about malaise and nausea as well as chills and rhinorrhea. She reports receiving her COVID booster this past Wednesday. On Thursday she developed malaise and nausea and now also has chills and rhinorrhea. She requests advice on whether to seek medical evaluation. Given symptoms, I am suspicious for URI and encouraged her to present to urgent care. Patient confirmed understanding of this suggestion.   Perlie Gold, PA-C

## 2023-01-17 ENCOUNTER — Encounter (HOSPITAL_BASED_OUTPATIENT_CLINIC_OR_DEPARTMENT_OTHER): Payer: Self-pay | Admitting: Cardiovascular Disease

## 2023-01-17 ENCOUNTER — Encounter (HOSPITAL_COMMUNITY): Payer: Self-pay

## 2023-01-17 ENCOUNTER — Telehealth (HOSPITAL_BASED_OUTPATIENT_CLINIC_OR_DEPARTMENT_OTHER): Payer: Self-pay | Admitting: Cardiovascular Disease

## 2023-01-17 ENCOUNTER — Ambulatory Visit (HOSPITAL_COMMUNITY)
Admission: RE | Admit: 2023-01-17 | Discharge: 2023-01-17 | Disposition: A | Discharge status: Home / Self Care | Payer: Medicare Other | Source: Ambulatory Visit | Attending: Internal Medicine | Admitting: Internal Medicine

## 2023-01-17 VITALS — BP 150/54 | HR 66 | Temp 98.5°F | Resp 18 | Ht 60.0 in | Wt 159.3 lb

## 2023-01-17 DIAGNOSIS — N3001 Acute cystitis with hematuria: Secondary | ICD-10-CM | POA: Diagnosis not present

## 2023-01-17 DIAGNOSIS — J069 Acute upper respiratory infection, unspecified: Secondary | ICD-10-CM | POA: Insufficient documentation

## 2023-01-17 DIAGNOSIS — R110 Nausea: Secondary | ICD-10-CM | POA: Diagnosis present

## 2023-01-17 LAB — POCT URINALYSIS DIP (MANUAL ENTRY)
Bilirubin, UA: NEGATIVE
Glucose, UA: 100 mg/dL — AB
Ketones, POC UA: NEGATIVE mg/dL
Nitrite, UA: NEGATIVE
Protein Ur, POC: NEGATIVE mg/dL
Spec Grav, UA: 1.015 (ref 1.010–1.025)
Urobilinogen, UA: 2 U/dL — AB
pH, UA: 6.5 (ref 5.0–8.0)

## 2023-01-17 MED ORDER — FLUCONAZOLE 150 MG PO TABS: 150.0000 mg | ORAL_TABLET | Freq: Once | ORAL | 0 refills | Status: DC | PRN

## 2023-01-17 MED ORDER — FLUCONAZOLE 150 MG PO TABS: 150.0000 mg | ORAL_TABLET | Freq: Once | ORAL | 0 refills | Status: AC

## 2023-01-17 MED ORDER — PROMETHAZINE HCL 12.5 MG PO TABS: 12.5000 mg | ORAL_TABLET | Freq: Two times a day (BID) | ORAL | 0 refills | Status: DC | PRN

## 2023-01-17 MED ORDER — CHLORPHENIRAMINE MALEATE 4 MG PO TABS: 4.0000 mg | ORAL_TABLET | Freq: Every evening | ORAL | 0 refills | Status: DC

## 2023-01-17 MED ORDER — CEPHALEXIN 500 MG PO CAPS: 500.0000 mg | ORAL_CAPSULE | Freq: Two times a day (BID) | ORAL | 0 refills | Status: AC

## 2023-01-17 MED ORDER — ONDANSETRON 4 MG PO TBDP: 4.0000 mg | ORAL_TABLET | Freq: Once | ORAL | Status: DC

## 2023-01-17 NOTE — ED Triage Notes (Signed)
Pt presents with complaints of nausea, loss of appetite, chills, and runny nose since Thursday 12/19. Pt currently denies any pain. Pt denies taking medications for her symptoms as she is unsure of what to take given her history and the current medications she is on.   Pt is requesting she be checked for UTI today as well. Pt does endorse burning with urination and urinary leakage.

## 2023-01-17 NOTE — ED Provider Notes (Signed)
[facility]    CSN: [id number] Arrival date & time: 01/17/23  1340      History   Chief Complaint Chief Complaint  Patient presents with   Nausea    Chills, slightly runny nose - Entered by patient   Burning with Urination    HPI JANELLA PERELLA is a 77 y.o. female.  Several day history of dysuria and frequency Also reports getting covid booster on Wednesday, then Thursday developed runny nose, chills, fatigue, and nausea Nausea is the worst of her symptoms  Not having fever. No abdominal pain or vomiting Denies cough  Hx HTN, afib, CHF, interstitial cystitis  She has been advised to not take any nausea medications due to her use of Tikosyn  Past Medical History:  Diagnosis Date   Anxiety 06/29/2012   Aortic regurgitation 08/13/2015   Arthritis    "hands, wrists, back, feet, toes" (06/24/2017)   Atrial flutter (HCC) 09/18/2015   Atypical chest pain 05/13/2016   Chest pain    remote cath in 1996 with NORMAL coronaries noted   CHF (congestive heart failure) (HCC)    Dyspnea 10/30/2010   Essential hypertension 09/05/2013   Exogenous obesity    history of    GERD (gastroesophageal reflux disease)    Glaucoma, both eyes    Headache, migraine    "stopped in my 50's" (09/18/2015)   History of cardiovascular stress test 2007   showing no ischemia   Hypertension    Mitral stenosis and aortic insufficiency 06/23/2010   Mitral stenosis with insufficiency    Mitral stenosis with regurgitation    Murmur    of mitral stenosis and moderate aortic insufficiency       Nausea 05/01/2018   Obesity (BMI 30.0-34.9) 02/13/2021   OSA on CPAP    Palpitations    occasional   Paroxysmal A-fib (HCC) 06/24/2017   Pericardial effusion 09/18/2015   Persistent atrial fibrillation (HCC) 03/10/2017   Personal history of rheumatic heart disease    S/P minimally-invasive maze operation for atrial fibrillation 12/13/2018   Complete bilateral atrial lesion set using cryothermy and bipolar  radiofrequency ablation with clipping of LA appendage via right mini-thoracotomy approach   S/P minimally-invasive mitral valve replacement with bioprosthetic valve 12/13/2018   31 mm Edwards Magna Mitral stented bovine pericardial tissue valve   SBE (subacute bacterial endocarditis)    prophylaxis, patient unaware   Sliding hiatal hernia    Stress 02/13/2021   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   Vertigo 05/01/2018    Patient Active Problem List   Diagnosis Date Noted   Atrial flutter with rapid ventricular response (HCC)    Hypercoagulable state due to persistent atrial fibrillation (HCC) 09/01/2021   Obesity (BMI 30.0-34.9) 02/13/2021   Stress 02/13/2021   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   SVT (supraventricular tachycardia) (HCC) 01/22/2019   S/P minimally-invasive mitral valve replacement with bioprosthetic valve 12/13/2018   S/P minimally-invasive maze operation for atrial fibrillation 12/13/2018   Nausea 05/01/2018   Vertigo 05/01/2018   Lump on finger 12/30/2017   Pain in finger 12/29/2017   Persistent atrial fibrillation/flutter 03/10/2017   Asymptomatic microscopic hematuria 11/02/2016   Rash 06/08/2016   Atypical chest pain 05/13/2016   Pericardial effusion 09/18/2015   Aortic regurgitation 08/13/2015   Dysuria 09/24/2014   IC (interstitial cystitis) 09/24/2014   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   Essential hypertension 09/05/2013   Anxiety 06/29/2012   GERD (gastroesophageal reflux disease) 03/11/2011   Dyspnea 10/30/2010    Past Surgical History:  Procedure  Laterality Date   ABDOMINAL HYSTERECTOMY  1975   APPENDECTOMY  1977   ATRIAL FIBRILLATION ABLATION N/A 03/10/2017   Procedure: ATRIAL FIBRILLATION ABLATION;  Surgeon: Regan Lemming, MD;  [facility];  Service: Cardiovascular;  Laterality: N/A;   ATRIAL FIBRILLATION ABLATION  06/24/2017   ATRIAL FIBRILLATION ABLATION N/A 06/24/2017   Procedure: ATRIAL FIBRILLATION  ABLATION;  Surgeon: Regan Lemming, MD;  [facility];  Service: Cardiovascular;  Laterality: N/A;   ATRIAL FIBRILLATION ABLATION N/A 02/23/2022   Procedure: ATRIAL FIBRILLATION ABLATION;  Surgeon: Regan Lemming, MD;  [facility];  Service: Cardiovascular;  Laterality: N/A;   BUNIONECTOMY Right 2000s   CARDIAC CATHETERIZATION  01/13/1995   normal coronary anatomy and mild mitral stenosis and mild pulmonary hypertension   CARDIOVERSION N/A 09/22/2015   Procedure: CARDIOVERSION;  Surgeon: Jake Bathe, MD;  Location: [facility] ENDOSCOPY;  Service: Cardiovascular;  Laterality: N/A;   CARDIOVERSION N/A 10/25/2016   Procedure: CARDIOVERSION;  Surgeon: Pricilla Riffle, MD;  Location: [facility] ENDOSCOPY;  Service: Cardiovascular;  Laterality: N/A;   CARDIOVERSION N/A 04/15/2017   Procedure: CARDIOVERSION;  Surgeon: Wendall Stade, MD;  Location: [facility] ENDOSCOPY;  Service: Cardiovascular;  Laterality: N/A;   CARDIOVERSION N/A 05/16/2017   Procedure: CARDIOVERSION;  Surgeon: Chrystie Nose, MD;  Location: [facility] ENDOSCOPY;  Service: Cardiovascular;  Laterality: N/A;   CARDIOVERSION N/A 01/24/2019   Procedure: CARDIOVERSION;  Surgeon: Chrystie Nose, MD;  Location: [facility] ENDOSCOPY;  Service: Cardiovascular;  Laterality: N/A;   CARDIOVERSION N/A 10/14/2021   Procedure: CARDIOVERSION;  Surgeon: Little Ishikawa, MD;  Location: [facility] ENDOSCOPY;  Service: Cardiovascular;  Laterality: N/A;   CARDIOVERSION N/A 11/20/2021   Procedure: CARDIOVERSION;  Surgeon: Pricilla Riffle, MD;  Location: [facility] ENDOSCOPY;  Service: Cardiovascular;  Laterality: N/A;   CATARACT EXTRACTION W/ INTRAOCULAR LENS  IMPLANT, BILATERAL Bilateral 2013   COLONOSCOPY  2013   EYE SURGERY Bilateral    cataracts   LAPAROSCOPIC CHOLECYSTECTOMY  1997   MINIMALLY INVASIVE MAZE PROCEDURE N/A 12/13/2018   Procedure: MINIMALLY INVASIVE MAZE PROCEDURE using a 45 MM AtriClip.;  Surgeon: Purcell Nails, MD;  Location: [facility];   Service: Open Heart Surgery;  Laterality: N/A;   MITRAL VALVE REPLACEMENT Right 12/13/2018   Procedure: MINIMALLY INVASIVE MITRAL VALVE (MV) REPLACEMENT using a Magna Mitral Ease 31 MM Valve.;  Surgeon: Purcell Nails, MD;  Location: [facility];  Service: Open Heart Surgery;  Laterality: Right;   RIGHT/LEFT HEART CATH AND CORONARY ANGIOGRAPHY N/A 10/18/2018   Procedure: RIGHT/LEFT HEART CATH AND CORONARY ANGIOGRAPHY;  Surgeon: Marykay Lex, MD;  Location: [facility] INVASIVE CV LAB;  Service: Cardiovascular;  Laterality: N/A;   TEE WITHOUT CARDIOVERSION N/A 06/23/2017   Procedure: TRANSESOPHAGEAL ECHOCARDIOGRAM (TEE);  Surgeon: Pricilla Riffle, MD;  Location: [facility] ENDOSCOPY;  Service: Cardiovascular;  Laterality: N/A;   TEE WITHOUT CARDIOVERSION N/A 10/17/2018   Procedure: TRANSESOPHAGEAL ECHOCARDIOGRAM (TEE);  Surgeon: Jake Bathe, MD;  Location: [facility] ENDOSCOPY;  Service: Cardiovascular;  Laterality: N/A;   TEE WITHOUT CARDIOVERSION N/A 12/13/2018   Procedure: TRANSESOPHAGEAL ECHOCARDIOGRAM (TEE);  Surgeon: Purcell Nails, MD;  Location: [facility] OR;  Service: Open Heart Surgery;  Laterality: N/A;   TONSILLECTOMY AND ADENOIDECTOMY  1990s   UVULOPALATOPHARYNGOPLASTY, TONSILLECTOMY AND SEPTOPLASTY  1990s    OB History   No obstetric history on file.      Home Medications    Prior to Admission medications   Medication Sig Start Date End  Date Taking? Authorizing Provider  cephALEXin (KEFLEX) 500 MG capsule Take 1 capsule (500 mg total) by mouth 2 (two) times daily for 7 days. 01/17/23 01/24/23 Yes Bob Eastwood, Lurena Joiner, PA-C  chlorpheniramine (CHLOR-TRIMETON) 4 MG tablet Take 1 tablet (4 mg total) by mouth at bedtime. 01/17/23  Yes Suad Autrey, Lurena Joiner, PA-C  promethazine (PHENERGAN) 12.5 MG tablet Take 1 tablet (12.5 mg total) by mouth 2 (two) times daily as needed for nausea or vomiting. 01/17/23  Yes Harvy Riera, Lurena Joiner, PA-C  acetaminophen (TYLENOL) 500 MG tablet Take 500 mg by mouth daily as needed for moderate pain.     [provider]  ALPRAZolam Prudy Feeler) 0.25 MG tablet Take 0.125-0.25 mg by mouth See admin instructions. Take 0.25 mg at night, may take 0.125 mg dose as needed for anxiety    [provider]  atorvastatin (LIPITOR) 10 MG tablet Take 10 mg by mouth 2 (two) times a week. 10/01/21   [provider]  Calcium Citrate-Vitamin D (CALCIUM + D PO) Take 1 tablet by mouth daily.    [provider]  carvedilol (COREG) 3.125 MG tablet TAKE 1 TABLET BY MOUTH TWICE  DAILY WITH MEALS 02/08/22   Fenton, Clint R, PA  Cholecalciferol (VITAMIN D) 50 MCG (2000 UT) tablet Take 2,000 Units by mouth daily.    [provider]  conjugated estrogens (PREMARIN) vaginal cream Place 1 Applicatorful vaginally 3 (three) times a week.    [provider]  cyanocobalamin (VITAMIN B12) 1000 MCG tablet Take 1,000 mcg by mouth daily.    [provider]  diltiazem (CARDIZEM CD) 120 MG 24 hr capsule Take 1 capsule (120 mg total) by mouth at bedtime. Patient taking differently: Take 120 mg by mouth at bedtime. At 6 pm 10/11/22   Fenton, Clint R, PA  diltiazem (CARDIZEM) 30 MG tablet Take 1 tablet (30 mg total) by mouth 4 (four) times daily as needed (for heart rates greater than 100). 10/06/21   Camnitz, Andree Coss, MD  dofetilide (TIKOSYN) 500 MCG capsule Take 1 capsule (500 mcg total) by mouth 2 (two) times daily. 07/21/22   Chilton Si, MD  dorzolamide-timolol (COSOPT) 22.3-6.8 MG/ML ophthalmic solution Place 1 drop into both eyes 2 (two) times daily.    [provider]  ELIQUIS 5 MG TABS tablet TAKE 1 TABLET BY MOUTH TWICE  DAILY 09/21/22   Camnitz, Andree Coss, MD  empagliflozin (JARDIANCE) 10 MG TABS tablet Take 1 tablet (10 mg total) by mouth daily before breakfast. 12/31/22   Chilton Si, MD  fluconazole (DIFLUCAN) 150 MG tablet Take 1 tablet (150 mg total) by mouth once for 1 dose. 01/17/23 01/17/23  Khyler Eschmann, PA-C  fluticasone (FLONASE) 50  MCG/ACT nasal spray Place 1 spray into both nostrils daily.    [provider]  furosemide (LASIX) 20 MG tablet Take 1 tablet (20 mg total) by mouth daily as needed for fluid. For >3lb/overnight or >5lb/weekly note dose 11/05/20   Chilton Si, MD  gabapentin (NEURONTIN) 300 MG capsule Take 300 mg by mouth 3 (three) times daily.    [provider]  hydrALAZINE (APRESOLINE) 25 MG tablet Take 1 tablet (25 mg total) by mouth in the morning and at bedtime. 07/13/22   Alver Sorrow, NP  latanoprost (XALATAN) 0.005 % ophthalmic solution Place 1 drop into both eyes at bedtime.  06/17/14   [provider]  Lidocaine-Glycerin (PREPARATION H EX) Apply 1 Application topically daily as needed (hemorrhoids).    [provider]  magnesium oxide (MAG-OX)  400 (240 Mg) MG tablet Take 1 tablet (400 mg total) by mouth every other day. 04/16/22   Fenton, Clint R, PA  meclizine (ANTIVERT) 25 MG tablet Take 25 mg by mouth as needed for dizziness or nausea. 01/05/21   [provider]  Multiple Vitamin (MULTIVITAMIN WITH MINERALS) TABS tablet Take 1 tablet by mouth daily.    [provider]  NON FORMULARY Apply 1 application topically 2 (two) times daily as needed (for eczema). Traimcinolone/[facility] Moist Cream    [provider]  oxybutynin (DITROPAN-XL) 10 MG 24 hr tablet Take 10 mg by mouth daily. 12/16/15   [provider]  pantoprazole (PROTONIX) 40 MG tablet Take 40 mg by mouth daily.    [provider]  polyethylene glycol (MIRALAX / GLYCOLAX) 17 g packet Take 17 g by mouth every other day.    [provider]  potassium chloride (KLOR-CON) 10 MEQ tablet Take 1 tablet (10 mEq total) by mouth daily as needed (when you take Lasix). 10/06/21   Camnitz, Andree Coss, MD  PRESCRIPTION MEDICATION Inhale into the lungs at bedtime. CPAP    [provider]  Probiotic Product (PROBIOTIC PO) Take 1 capsule by mouth daily.     [provider]  sodium chloride (OCEAN) 0.65 % SOLN nasal spray Place 1 spray into both nostrils at bedtime.    [provider]  valsartan (DIOVAN) 160 MG tablet TAKE 1 TABLET BY MOUTH TWICE  DAILY 11/26/22   Alver Sorrow, NP    Family History Family History  Problem Relation Age of Onset   Heart attack Mother    Hypertension Brother    Kidney disease Brother    Diabetes Brother    Heart failure Maternal Grandfather     Social History Social History   Tobacco Use   Smoking status: Never   Smokeless tobacco: Never   Tobacco comments:    Never smoke 09/01/21  Vaping Use   Vaping status: Never Used  Substance Use Topics   Alcohol use: Yes    Comment: rarely   Drug use: No     Allergies   Tizanidine, Amoxicillin, Metoprolol tartrate, and Oxaprozin   Review of Systems Review of Systems Per HPI  Physical Exam Triage Vital Signs ED Triage Vitals  Encounter Vitals Group     BP 01/17/23 1424 (!) 150/54     Systolic BP Percentile --      Diastolic BP Percentile --      Pulse Rate 01/17/23 1424 66     Resp 01/17/23 1424 18     Temp 01/17/23 1424 98.5 F (36.9 C)     Temp Source 01/17/23 1424 Oral     SpO2 01/17/23 1424 96 %     Weight 01/17/23 1423 159 lb 4.8 oz (72.3 kg)     Height 01/17/23 1423 5' (1.524 m)     Head Circumference --      Peak Flow --      Pain Score 01/17/23 1423 0     Pain Loc --      Pain Education --      Exclude from Growth Chart --    No data found.  Updated Vital Signs BP (!) 150/54 (BP Location: Right Arm)   Pulse 66   Temp 98.5 F (36.9 C) (Oral)   Resp 18   Ht 5' (1.524 m)   Wt 159 lb 4.8 oz (72.3 kg)   SpO2 96%   BMI 31.11 kg/m  Physical Exam Vitals and nursing note reviewed.  Constitutional:      Appearance: She is ill-appearing.  HENT:     Right Ear: Tympanic membrane and ear canal normal.     Left Ear: Tympanic membrane and ear canal normal.     Nose: Rhinorrhea present.      Mouth/Throat:     Mouth: Mucous membranes are moist.     Pharynx: Oropharynx is clear. No posterior oropharyngeal erythema.  Eyes:     Conjunctiva/sclera: Conjunctivae normal.  Cardiovascular:     Rate and Rhythm: Normal rate and regular rhythm.     Heart sounds: Normal heart sounds.     Comments: Faint murmur heard. Hx valve replacement  Pulmonary:     Effort: Pulmonary effort is normal.     Breath sounds: Normal breath sounds.  Abdominal:     General: Bowel sounds are normal.     Palpations: Abdomen is soft.     Tenderness: There is no abdominal tenderness. There is no right CVA tenderness, left CVA tenderness, guarding or rebound.     Comments: Nontender but uncomfortable with exam  Musculoskeletal:     Cervical back: Normal range of motion.  Lymphadenopathy:     Cervical: No cervical adenopathy.  Skin:    General: Skin is warm and dry.  Neurological:     Mental Status: She is alert and oriented to person, place, and time.     UC Treatments / Results  Labs (all labs ordered are listed, but only abnormal results are displayed) Labs Reviewed  POCT URINALYSIS DIP (MANUAL ENTRY) - Abnormal; Notable for the following components:      Result Value   Color, UA straw (*)    Clarity, UA hazy (*)    Glucose, UA =100 (*)    Blood, UA trace-lysed (*)    Urobilinogen, UA 2.0 (*)    Leukocytes, UA Small (1+) (*)    All other components within normal limits  URINE CULTURE    EKG  Radiology No results found.  Procedures Procedures  Medications Ordered in UC Medications - No data to display  Initial Impression / Assessment and Plan / UC Course  I have reviewed the triage vital signs and the nursing notes.  Pertinent labs & imaging results that were available during my care of the patient were reviewed by me and considered in my medical decision making (see chart for details).  Patient declines zofran dose, reports cardiology told her not to take. UA with small leuks and  trace RBC Will culture. Treat with keflex BID x 7 Reporting history of antibiotic associated yeast infection. Topical cream does not work for her. Treat with single dose fluconazole. Cardiology has prescribed in the past, most recently 12/10. I am comfortable with her using a single dose to hopefully prevent yeast infection.  Suspect other symptoms are viral, or related to booster. Recommend symptomatic care. Nightly antihistamine, nasal spray, fluids, rest  Try promethazine 12.5 mg 1-2 times daily, only for a few days. Benefits outweigh risks. Especially with nausea being her worst symptom and will now unfortunately be starting an antibiotic. Advised contacting cardiology today to let them know about the new medications, and see if she should be scheduled for close follow up.  Strict ED precautions in the meantime.   Final Clinical Impressions(s) / UC Diagnoses   Final diagnoses:  Acute cystitis with hematuria  Viral URI  Nausea     Discharge Instructions      I am  treating you for a urinary tract infection.  Please take Keflex antibiotic as prescribed. Take with food to avoid further upset stomach.  Your other symptoms are likely from a virus, or related to the covid booster you just received. Make sure you are drinking plenty of fluids! You can take chlorpheniramine one tablet at night to help with runny nose.  The promethazine is used for nausea. Please note it might make you sleepy. Be cautions while using. Only use once or twice a day for the next 2 days to help relieve nausea.  Please call your cardiology office today to discuss the medicines you'll be starting  Please go to the emergency department if symptoms worsen.     ED Prescriptions     Medication Sig Dispense Auth. Provider   cephALEXin (KEFLEX) 500 MG capsule Take 1 capsule (500 mg total) by mouth 2 (two) times daily for 7 days. 14 capsule Callee Rohrig, PA-C   chlorpheniramine (CHLOR-TRIMETON) 4 MG tablet  Take 1 tablet (4 mg total) by mouth at bedtime. 14 tablet Antionne Enrique, PA-C   fluconazole (DIFLUCAN) 150 MG tablet  (Status: Discontinued) Take 1 tablet (150 mg total) by mouth once as needed for up to 2 doses (take one pill on day 1, and the second pill 3 days later). 2 tablet Lamonda Noxon, PA-C   fluconazole (DIFLUCAN) 150 MG tablet Take 1 tablet (150 mg total) by mouth once for 1 dose. 1 tablet Jammal Sarr, PA-C   promethazine (PHENERGAN) 12.5 MG tablet Take 1 tablet (12.5 mg total) by mouth 2 (two) times daily as needed for nausea or vomiting. 10 tablet Tory Septer, Lurena Joiner, PA-C      PDMP not reviewed this encounter.   Marlow Baars, [place] 01/17/23 2841

## 2023-01-17 NOTE — Discharge Instructions (Addendum)
I am treating you for a urinary tract infection.  Please take Keflex antibiotic as prescribed. Take with food to avoid further upset stomach.  Your other symptoms are likely from a virus, or related to the covid booster you just received. Make sure you are drinking plenty of fluids! You can take chlorpheniramine one tablet at night to help with runny nose.  The promethazine is used for nausea. Please note it might make you sleepy. Be cautions while using. Only use once or twice a day for the next 2 days to help relieve nausea.  Please call your cardiology office today to discuss the medicines you'll be starting  Please go to the emergency department if symptoms worsen.

## 2023-01-17 NOTE — Telephone Encounter (Signed)
Patient called in c/o bad nausea for several days, has appointment with Urgent Care today Is unable to eat Has not had any Jardiance which is recent start, since Saturday  Was to get follow up labs after being on the Jardiance  Advised ok to hold Jardiance until she was feeling better then have follow up labs couple weeks after being back on it. Encouraged to stay hydrated  Patient stated she would send mychart with update after her visit

## 2023-01-18 ENCOUNTER — Other Ambulatory Visit: Payer: Self-pay

## 2023-01-18 ENCOUNTER — Telehealth: Payer: Self-pay | Admitting: Cardiovascular Disease

## 2023-01-18 ENCOUNTER — Emergency Department (HOSPITAL_COMMUNITY)
Admission: EM | Admit: 2023-01-18 | Discharge: 2023-01-19 | Disposition: A | Discharge status: Home / Self Care | Payer: Medicare Other | Attending: Emergency Medicine | Admitting: Emergency Medicine

## 2023-01-18 DIAGNOSIS — I509 Heart failure, unspecified: Secondary | ICD-10-CM | POA: Insufficient documentation

## 2023-01-18 DIAGNOSIS — Z7901 Long term (current) use of anticoagulants: Secondary | ICD-10-CM | POA: Insufficient documentation

## 2023-01-18 DIAGNOSIS — Z79899 Other long term (current) drug therapy: Secondary | ICD-10-CM | POA: Insufficient documentation

## 2023-01-18 DIAGNOSIS — R197 Diarrhea, unspecified: Secondary | ICD-10-CM | POA: Diagnosis present

## 2023-01-18 DIAGNOSIS — R110 Nausea: Secondary | ICD-10-CM | POA: Insufficient documentation

## 2023-01-18 DIAGNOSIS — Z20822 Contact with and (suspected) exposure to covid-19: Secondary | ICD-10-CM | POA: Insufficient documentation

## 2023-01-18 DIAGNOSIS — R5383 Other fatigue: Secondary | ICD-10-CM | POA: Insufficient documentation

## 2023-01-18 DIAGNOSIS — R9431 Abnormal electrocardiogram [ECG] [EKG]: Secondary | ICD-10-CM | POA: Diagnosis not present

## 2023-01-18 DIAGNOSIS — I110 Hypertensive heart disease with heart failure: Secondary | ICD-10-CM | POA: Insufficient documentation

## 2023-01-18 LAB — CBC WITH DIFFERENTIAL/PLATELET
Abs Immature Granulocytes: 0.02 10*3/uL (ref 0.00–0.07)
Basophils Absolute: 0 10*3/uL (ref 0.0–0.1)
Basophils Relative: 0 %
Eosinophils Absolute: 0 10*3/uL (ref 0.0–0.5)
Eosinophils Relative: 0 %
HCT: 42.6 % (ref 36.0–46.0)
Hemoglobin: 13.8 g/dL (ref 12.0–15.0)
Immature Granulocytes: 0 %
Lymphocytes Relative: 24 %
Lymphs Abs: 2 10*3/uL (ref 0.7–4.0)
MCH: 29.7 pg (ref 26.0–34.0)
MCHC: 32.4 g/dL (ref 30.0–36.0)
MCV: 91.6 fL (ref 80.0–100.0)
Monocytes Absolute: 0.5 10*3/uL (ref 0.1–1.0)
Monocytes Relative: 5 %
Neutro Abs: 6 10*3/uL (ref 1.7–7.7)
Neutrophils Relative %: 71 %
Platelets: 225 10*3/uL (ref 150–400)
RBC: 4.65 MIL/uL (ref 3.87–5.11)
RDW: 15.6 % — ABNORMAL HIGH (ref 11.5–15.5)
WBC: 8.5 10*3/uL (ref 4.0–10.5)
nRBC: 0 % (ref 0.0–0.2)

## 2023-01-18 LAB — COMPREHENSIVE METABOLIC PANEL
ALT: 22 U/L (ref 0–44)
AST: 24 U/L (ref 15–41)
Albumin: 3.9 g/dL (ref 3.5–5.0)
Alkaline Phosphatase: 75 U/L (ref 38–126)
Anion gap: 11 (ref 5–15)
BUN: 12 mg/dL (ref 8–23)
CO2: 23 mmol/L (ref 22–32)
Calcium: 9.5 mg/dL (ref 8.9–10.3)
Chloride: 104 mmol/L (ref 98–111)
Creatinine, Ser: 0.86 mg/dL (ref 0.44–1.00)
GFR, Estimated: >60 mL/min (ref >60)
Glucose, Bld: 158 mg/dL — ABNORMAL HIGH (ref 70–99)
Potassium: 3.5 mmol/L (ref 3.5–5.1)
Sodium: 138 mmol/L (ref 135–145)
Total Bilirubin: 1.3 mg/dL — ABNORMAL HIGH (ref <1.2)
Total Protein: 6.6 g/dL (ref 6.5–8.1)

## 2023-01-18 MED ORDER — PROMETHAZINE HCL 25 MG PO TABS
12.5000 mg | ORAL_TABLET | Freq: Once | ORAL | Status: AC
  Administered 2023-01-18: 12.5 mg via ORAL
  Filled 2023-01-18: qty 1

## 2023-01-18 NOTE — ED Triage Notes (Signed)
Patient reports nausea , fatigue and diarrhea onset this week , currently taking oral antibiotic for UTI .

## 2023-01-18 NOTE — Telephone Encounter (Signed)
Patient is fine to take phenergan.

## 2023-01-18 NOTE — Telephone Encounter (Signed)
Advised patient, verbalized understanding  

## 2023-01-18 NOTE — Discharge Instructions (Signed)
Please hold the antibiotic for now until you hear about your urine culture

## 2023-01-18 NOTE — Telephone Encounter (Signed)
Will forward to the pharmacist top review

## 2023-01-18 NOTE — Telephone Encounter (Signed)
Pt c/o medication issue:  1. Name of Medication: promethazine (PHENERGAN) 12.5 MG tablet   2. How are you currently taking this medication (dosage and times per day)?    3. Are you having a reaction (difficulty breathing--STAT)? no  4. What is your medication issue? Patient calling to speak to the nurse to make sure this medication is okay for her to take. Please advise

## 2023-01-18 NOTE — ED Provider Notes (Signed)
[place] EMERGENCY DEPARTMENT AT [facility] Provider Note   CSN: [id number] Arrival date & time: 01/18/23  2141     History {Add pertinent medical, surgical, social history, OB history to HPI:1} Chief Complaint  Patient presents with   Nausea / Diarrhea    Mary Farrell is a 77 y.o. female.  The history is provided by the patient.  Patient with extensive cardiac history presents with multiple complaints.  Patient reports last week she got her COVID booster and several days later she began having nausea and fatigue.  No fevers or vomiting.  The past days she has had 2-3 episodes of nonbloody diarrhea.  No chest pain or shortness of breath.  No abdominal pain She was seen yesterday at the urgent care was diagnosed with UTI She is concerned that it may be her heart.  She has an occasional flutter but she thinks she is in sinus rhythm. She was given promethazine by her provider at urgent care and took 1 dose with some relief   Past Medical History:  Diagnosis Date   Anxiety 06/29/2012   Aortic regurgitation 08/13/2015   Arthritis    "hands, wrists, back, feet, toes" (06/24/2017)   Atrial flutter (HCC) 09/18/2015   Atypical chest pain 05/13/2016   Chest pain    remote cath in 1996 with NORMAL coronaries noted   CHF (congestive heart failure) (HCC)    Dyspnea 10/30/2010   Essential hypertension 09/05/2013   Exogenous obesity    history of    GERD (gastroesophageal reflux disease)    Glaucoma, both eyes    Headache, migraine    "stopped in my 50's" (09/18/2015)   History of cardiovascular stress test 2007   showing no ischemia   Hypertension    Mitral stenosis and aortic insufficiency 06/23/2010   Mitral stenosis with insufficiency    Mitral stenosis with regurgitation    Murmur    of mitral stenosis and moderate aortic insufficiency       Nausea 05/01/2018   Obesity (BMI 30.0-34.9) 02/13/2021   OSA on CPAP    Palpitations    occasional   Paroxysmal A-fib (HCC)  06/24/2017   Pericardial effusion 09/18/2015   Persistent atrial fibrillation (HCC) 03/10/2017   Personal history of rheumatic heart disease    S/P minimally-invasive maze operation for atrial fibrillation 12/13/2018   Complete bilateral atrial lesion set using cryothermy and bipolar radiofrequency ablation with clipping of LA appendage via right mini-thoracotomy approach   S/P minimally-invasive mitral valve replacement with bioprosthetic valve 12/13/2018   31 mm [facility] Mitral stented bovine pericardial tissue valve   SBE (subacute bacterial endocarditis)    prophylaxis, patient unaware   Sliding hiatal hernia    Stress 02/13/2021   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 Authorizing Provider  acetaminophen (TYLENOL) 500 MG tablet Take 500 mg by mouth daily as needed for moderate pain.    [provider]  ALPRAZolam Prudy Feeler) 0.25 MG tablet Take 0.125-0.25 mg by mouth See admin instructions. Take 0.25 mg at night, may take 0.125 mg dose as needed for anxiety    [provider]  atorvastatin (LIPITOR) 10 MG tablet Take 10 mg by mouth 2 (two) times a week. 10/01/21   [provider]  Calcium Citrate-Vitamin D (CALCIUM + D PO) Take 1 tablet by mouth daily.    [provider]  carvedilol (  COREG) 3.125 MG tablet TAKE 1 TABLET BY MOUTH TWICE  DAILY WITH MEALS 02/08/22   Fenton, Clint R, PA  cephALEXin (KEFLEX) 500 MG capsule Take 1 capsule (500 mg total) by mouth 2 (two) times daily for 7 days. 01/17/23 01/24/23  Rising, Lurena Joiner, PA-C  chlorpheniramine (CHLOR-TRIMETON) 4 MG tablet Take 1 tablet (4 mg total) by mouth at bedtime. 01/17/23   Rising, Lurena Joiner, PA-C  Cholecalciferol (VITAMIN D) 50 MCG (2000 UT) tablet Take 2,000 Units by mouth daily.    [provider]  conjugated estrogens (PREMARIN) vaginal cream Place 1 Applicatorful  vaginally 3 (three) times a week.    [provider]  cyanocobalamin (VITAMIN B12) 1000 MCG tablet Take 1,000 mcg by mouth daily.    [provider]  diltiazem (CARDIZEM CD) 120 MG 24 hr capsule Take 1 capsule (120 mg total) by mouth at bedtime. Patient taking differently: Take 120 mg by mouth at bedtime. At 6 pm 10/11/22   Fenton, Clint R, PA  diltiazem (CARDIZEM) 30 MG tablet Take 1 tablet (30 mg total) by mouth 4 (four) times daily as needed (for heart rates greater than 100). 10/06/21   Camnitz, Andree Coss, MD  dofetilide (TIKOSYN) 500 MCG capsule Take 1 capsule (500 mcg total) by mouth 2 (two) times daily. 07/21/22   Chilton Si, MD  dorzolamide-timolol (COSOPT) 22.3-6.8 MG/ML ophthalmic solution Place 1 drop into both eyes 2 (two) times daily.    [provider]  ELIQUIS 5 MG TABS tablet TAKE 1 TABLET BY MOUTH TWICE  DAILY 09/21/22   Camnitz, Andree Coss, MD  empagliflozin (JARDIANCE) 10 MG TABS tablet Take 1 tablet (10 mg total) by mouth daily before breakfast. 12/31/22   Chilton Si, MD  fluticasone [facility]) 50 MCG/ACT nasal spray Place 1 spray into both nostrils daily.    [provider]  furosemide (LASIX) 20 MG tablet Take 1 tablet (20 mg total) by mouth daily as needed for fluid. For >3lb/overnight or >5lb/weekly note dose 11/05/20   Chilton Si, MD  gabapentin (NEURONTIN) 300 MG capsule Take 300 mg by mouth 3 (three) times daily.    [provider]  hydrALAZINE (APRESOLINE) 25 MG tablet Take 1 tablet (25 mg total) by mouth in the morning and at bedtime. 07/13/22   Alver Sorrow, NP  latanoprost (XALATAN) 0.005 % ophthalmic solution Place 1 drop into both eyes at bedtime.  06/17/14   [provider]  Lidocaine-Glycerin (PREPARATION H EX) Apply 1 Application topically daily as needed (hemorrhoids).    [provider]  magnesium oxide (MAG-OX) 400 (240 Mg) MG tablet Take 1 tablet (400 mg total) by mouth every  other day. 04/16/22   Fenton, Clint R, PA  meclizine (ANTIVERT) 25 MG tablet Take 25 mg by mouth as needed for dizziness or nausea. 01/05/21   [provider]  Multiple Vitamin (MULTIVITAMIN WITH MINERALS) TABS tablet Take 1 tablet by mouth daily.    [provider]  NON FORMULARY Apply 1 application topically 2 (two) times daily as needed (for eczema). Traimcinolone/[facility] Moist Cream    [provider]  oxybutynin (DITROPAN-XL) 10 MG 24 hr tablet Take 10 mg by mouth daily. 12/16/15   [provider]  pantoprazole (PROTONIX) 40 MG tablet Take 40 mg by mouth daily.    [provider]  polyethylene glycol (MIRALAX / GLYCOLAX) 17 g packet Take 17 g by mouth every other day.    [provider]  potassium chloride (KLOR-CON) 10 MEQ tablet Take  1 tablet (10 mEq total) by mouth daily as needed (when you take Lasix). 10/06/21   Camnitz, Andree Coss, MD  PRESCRIPTION MEDICATION Inhale into the lungs at bedtime. CPAP    [provider]  Probiotic Product (PROBIOTIC PO) Take 1 capsule by mouth daily.    [provider]  promethazine (PHENERGAN) 12.5 MG tablet Take 1 tablet (12.5 mg total) by mouth 2 (two) times daily as needed for nausea or vomiting. 01/17/23   Rising, Lurena Joiner, PA-C  sodium chloride (OCEAN) 0.65 % SOLN nasal spray Place 1 spray into both nostrils at bedtime.    [provider]  valsartan (DIOVAN) 160 MG tablet TAKE 1 TABLET BY MOUTH TWICE  DAILY 11/26/22   Alver Sorrow, NP      Allergies    Tizanidine, Amoxicillin, Metoprolol tartrate, and Oxaprozin    Review of Systems   Review of Systems  Constitutional:  Positive for fatigue. Negative for fever.  Cardiovascular:  Negative for chest pain.  Gastrointestinal:  Positive for diarrhea and nausea. Negative for abdominal pain, blood in stool and vomiting.    Physical Exam Updated Vital Signs BP (!) 161/64 (BP Location: Right Arm)   Pulse 71   Temp 98.4 F  (36.9 C) (Oral)   Resp 20   SpO2 96%  Physical Exam CONSTITUTIONAL: Elderly, no acute distress HEAD: Normocephalic/atraumatic EYES: EOMI/PERRL, no icterus ENMT: Mucous membranes moist NECK: supple no meningeal signs CV: S1/S2 noted, no murmurs/rubs/gallops noted LUNGS: Lungs are clear to auscultation bilaterally, no apparent distress ABDOMEN: soft, nontender, no rebound or guarding, bowel sounds noted throughout abdomen GU:no cva tenderness NEURO: Pt is awake/alert/appropriate, moves all extremitiesx4.  No facial droop.  Patient ambulates without difficulty EXTREMITIES: pulses normal/equal, full ROM SKIN: warm, color normal PSYCH: Flat affect  ED Results / Procedures / Treatments   Labs (all labs ordered are listed, but only abnormal results are displayed) Labs Reviewed  CBC WITH DIFFERENTIAL/PLATELET - Abnormal; Notable for the following components:      Result Value   RDW 15.6 (*)    All other components within normal limits  COMPREHENSIVE METABOLIC PANEL - Abnormal; Notable for the following components:   Glucose, Bld 158 (*)    Total Bilirubin 1.3 (*)    All other components within normal limits  RESP PANEL BY RT-PCR (RSV, FLU A&B, COVID)  [id number]  TROPONIN I (HIGH SENSITIVITY)    EKG None  Radiology No results found.  Procedures Procedures  {Document cardiac monitor, telemetry assessment procedure when appropriate:1}  Medications Ordered in ED Medications  promethazine (PHENERGAN) tablet 12.5 mg (has no administration in time range)    ED Course/ Medical Decision Making/ A&P   {   Click here for ABCD2, HEART and other calculatorsREFRESH Note before signing :1}                              Medical Decision Making Amount and/or Complexity of Data Reviewed Labs: ordered. ECG/medicine tests: ordered.  Risk Prescription drug management.   This patient presents to the ED for concern of nausea, fatigue, this involves an extensive number of treatment  options, and is a complaint that carries with it a high risk of complications and morbidity.  The differential diagnosis includes but is not limited to cholecystitis, cholelithiasis, pancreatitis, gastritis, peptic ulcer disease, appendicitis, bowel obstruction, bowel perforation, diverticulitis, AAA, ischemic bowel    Comorbidities that complicate the patient evaluation: Patient's presentation is complicated by  their history of hypertension  Social Determinants of Health: Patient's  family stressors   increases the complexity of managing their presentation  Additional history obtained: Additional history obtained from family Records reviewed  outpatient records reviewed  Lab Tests: I Ordered, and personally interpreted labs.  The pertinent results include:  ***  Imaging Studies ordered: I ordered imaging studies including {imaging:26848}  I independently visualized and interpreted imaging which showed *** I agree with the radiologist interpretation  Cardiac Monitoring: The patient was maintained on a cardiac monitor.  I personally viewed and interpreted the cardiac monitor which showed an underlying rhythm of:  {cardiac monitor:26849}  Medicines ordered and prescription drug management: I ordered medication including ***  for ***  Reevaluation of the patient after these medicines showed that the patient    {resolved/improved/worsened:23923::"improved"}  Test Considered: Patient is low risk / negative by ***, therefore do not feel that *** is indicated.  Critical Interventions:  ***  Consultations Obtained: I requested consultation with the {consultation:26851}, and discussed  findings as well as pertinent plan - they recommend: ***  Reevaluation: After the interventions noted above, I reevaluated the patient and found that they have :{resolved/improved/worsened:23923::"improved"}  Complexity of problems addressed: Patient's presentation is most consistent with   {TKZS:01093}  Disposition: After consideration of the diagnostic results and the patient's response to treatment,  I feel that the patent would benefit from {disposition:26850}.     {Document critical care time when appropriate:1} {Document review of labs and clinical decision tools ie heart score, Chads2Vasc2 etc:1}  {Document your independent review of radiology images, and any outside records:1} {Document your discussion with family members, caretakers, and with consultants:1} {Document social determinants of health affecting pt's care:1} {Document your decision making why or why not admission, treatments were needed:1} Final Clinical Impression(s) / ED Diagnoses Final diagnoses:  None    Rx / DC Orders ED Discharge Orders     None

## 2023-01-19 ENCOUNTER — Encounter (HOSPITAL_COMMUNITY): Payer: Self-pay | Admitting: Emergency Medicine

## 2023-01-19 LAB — RESP PANEL BY RT-PCR (RSV, FLU A&B, COVID)  RVPGX2
Influenza A by PCR: NEGATIVE
Influenza B by PCR: NEGATIVE
Resp Syncytial Virus by PCR: NEGATIVE
SARS Coronavirus 2 by RT PCR: NEGATIVE

## 2023-01-19 LAB — TROPONIN I (HIGH SENSITIVITY): Troponin I (High Sensitivity): 8 ng/L (ref <18)

## 2023-01-19 LAB — URINE CULTURE: Culture: 60000 — AB

## 2023-01-19 NOTE — ED Notes (Signed)
Patient verbalizes understanding of discharge instructions. Opportunity for questioning and answers were provided. Armband removed by staff, pt discharged from ED. Pt taken to ED waiting room via wheel chair.  

## 2023-01-21 NOTE — Telephone Encounter (Signed)
We have to go back to the old time remedies for these patients.  You can buy a product at the pharmacy called Emetrol for Nausea.  It's a liquid that has been around for a long time.  The other option is to sip on some Coke that has gone flat.  You can still sometimes find Cola Syrup on the pharmacy shelves, but it's just easier to get a can of Coke.

## 2023-01-21 NOTE — Telephone Encounter (Addendum)
Followed up with patient to speak directly.  Has not taken anymore abx. Pt advised to follow up with PCP for further advisement. She understands to let us know if another abx started (d/t Tikosyn). Aware that afib/hr may be exacerbated by illness. She will keep [place] informed if afib/hr become an issue.

## 2023-02-04 ENCOUNTER — Other Ambulatory Visit (HOSPITAL_COMMUNITY): Payer: Self-pay | Admitting: Physician Assistant

## 2023-02-09 ENCOUNTER — Encounter: Payer: Self-pay | Admitting: Cardiology

## 2023-03-12 ENCOUNTER — Other Ambulatory Visit: Payer: Self-pay | Admitting: Physician Assistant

## 2023-03-17 NOTE — Progress Notes (Signed)
 Cardiology Office Note Date:  03/17/2023  Patient ID:  Farrell, Mary 1945/03/23, MRN [id number] PCP:  Daisy Floro, MD  Cardiologist:  Dr. Duke Salvia Electrophysiologist: Dr. Elberta Fortis    Chief Complaint:     3 mo  History of Present Illness: Mary Farrell is a 78 y.o. 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 Elberta Fortis 09/09/22, unfortunately recurrent palpitations, mentions Aflutter, ER visit with DCCV Discussed options Repeat ablation Changing to amiodarone She was going to give these options some thought  I saw her 10/19/22 She reports since starting Diltiazem (she reports starting after weaaring the monitor/being in the ER) her AFib/flutter seems to have simmered down She would really like to avoid another procedure as well as amiodarone When not in AF she feels well No CP, SOB No near syncope or syncope Reports good medication compliance No bleeding or signs of bleeding Stable EKG, improved burden Pt hoping to avoid amiodarone/ablation   ER visit 12/05/22, reported hit on the head by a TV antenna followed by nausea > CT negative, discharged from the ER, pt suspected nausea  2/2 cake she had eaten   I saw her 12/20/22 She had her hip injected las week Wed., that evening/overnight she woke about 4AM with a discomfort/pressure in her epigastrium/central chest.  She got up and checked her BP was OK, HR was OK Did not feel like Afib, suspected her GERD (given no arm, jaw pain or other symptoms), unfortunately no tums available. Eventually just settled away and has not happened again Though for a few days after the injection her HR unusually in the 70's (her usual in the 50's,  and seems as it settled back to her baseline, she feels tired, no energy, sluggish No symptoms of her AFib since our last visit Last week she did have a slight but steady weight gain maybe a little breathless or "wheeze" and took her PRN lasix with quick return to her baseline weight and resolution of her wheeze. Timing unclear but thinks noted pre-injection (steroid). No near syncope or syncope No bleeding or signs of bleeding ? If symptoms 2/2 bradycardia Might consider stopping her coreg? PLAN: Discussed her symptoms a length today EKG without acute changes No ongoing symptoms Planned to f/u w/Dr. Duke Salvia >> w/u as she felt indicated  Saw Dr. Duke Salvia 12/31/22. Jardiance added for her HFpEF, echo in a year  TODAY  Not long after starting Jardiance she developed a yeast infection A weak or so after that a UTI Has since stopped Jardiance  She has on occasion since then been battling nausea associated with feeling very coled and fatigued She saw her urologist who checked her urine and reported to be OK No N/V  She has some awareness of her heart beat mostly in the evening when in bed, HR unusually elevated for her 70's No CP No near syncope or syncope No bleeding/signs of bleeding  She inquires about stopping Tikosyn given medication interactions   AFib/AAD Amiodarone not tolerated 2/2 GI  Flecainide 2017 >> failed and had PR prolongation PVI ablation 03/10/2017 CTI/PVI Ablation May 2019 MAZE 2020 Tikosyn Jan 2021 PVI ablation 02/23/22  Past Medical History:  Diagnosis Date  Anxiety 06/29/2012   Aortic regurgitation 08/13/2015   Arthritis    "hands, wrists, back, feet, toes" (06/24/2017)   Atrial flutter (HCC) 09/18/2015   Atypical chest pain 05/13/2016   Chest pain    remote cath in 1996 with NORMAL coronaries noted   CHF (congestive heart failure) (HCC)    Dyspnea 10/30/2010   Essential hypertension 09/05/2013   Exogenous obesity    history of    GERD  (gastroesophageal reflux disease)    Glaucoma, both eyes    Headache, migraine    "stopped in my 50's" (09/18/2015)   History of cardiovascular stress test 2007   showing no ischemia   Hypertension    Mitral stenosis and aortic insufficiency 06/23/2010   Mitral stenosis with insufficiency    Mitral stenosis with regurgitation    Murmur    of mitral stenosis and moderate aortic insufficiency       Nausea 05/01/2018   Obesity (BMI 30.0-34.9) 02/13/2021   OSA on CPAP    Palpitations    occasional   Paroxysmal A-fib (HCC) 06/24/2017   Pericardial effusion 09/18/2015   Persistent atrial fibrillation (HCC) 03/10/2017   Personal history of rheumatic heart disease    S/P minimally-invasive maze operation for atrial fibrillation 12/13/2018   Complete bilateral atrial lesion set using cryothermy and bipolar radiofrequency ablation with clipping of LA appendage via right mini-thoracotomy approach   S/P minimally-invasive mitral valve replacement with bioprosthetic valve 12/13/2018   31 mm [facility] Mitral stented bovine pericardial tissue valve   SBE (subacute bacterial endocarditis)    prophylaxis, patient unaware   Sliding hiatal hernia    Stress 02/13/2021   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   Vertigo 05/01/2018    Past Surgical History:  Procedure Laterality Date   ABDOMINAL HYSTERECTOMY  1975   APPENDECTOMY  1977   ATRIAL FIBRILLATION ABLATION N/A 03/10/2017   Procedure: ATRIAL FIBRILLATION ABLATION;  Surgeon: Regan Lemming, MD;  Location: [facility] INVASIVE CV LAB;  Service: Cardiovascular;  Laterality: N/A;   ATRIAL FIBRILLATION ABLATION  06/24/2017   ATRIAL FIBRILLATION ABLATION N/A 06/24/2017   Procedure: ATRIAL FIBRILLATION ABLATION;  Surgeon: Regan Lemming, MD;  Location: [facility] INVASIVE CV LAB;  Service: Cardiovascular;  Laterality: N/A;   ATRIAL FIBRILLATION ABLATION N/A 02/23/2022   Procedure: ATRIAL FIBRILLATION ABLATION;  Surgeon: Regan Lemming, MD;   Location: [facility] INVASIVE CV LAB;  Service: Cardiovascular;  Laterality: N/A;   BUNIONECTOMY Right 2000s   CARDIAC CATHETERIZATION  01/13/1995   normal coronary anatomy and mild mitral stenosis and mild pulmonary hypertension   CARDIOVERSION N/A 09/22/2015   Procedure: CARDIOVERSION;  Surgeon: Jake Bathe, MD;  Location: [facility] ENDOSCOPY;  Service: Cardiovascular;  Laterality: N/A;   CARDIOVERSION N/A 10/25/2016   Procedure: CARDIOVERSION;  Surgeon: Pricilla Riffle, MD;  Location: [facility] ENDOSCOPY;  Service: Cardiovascular;  Laterality: N/A;   CARDIOVERSION N/A 04/15/2017   Procedure: CARDIOVERSION;  Surgeon: Wendall Stade, MD;  Location: [facility] ENDOSCOPY;  Service: Cardiovascular;  Laterality: N/A;   CARDIOVERSION N/A 05/16/2017   Procedure: CARDIOVERSION;  Surgeon: Chrystie Nose, MD;  Location: [facility] ENDOSCOPY;  Service: Cardiovascular;  Laterality: N/A;   CARDIOVERSION N/A 01/24/2019   Procedure: CARDIOVERSION;  Surgeon: Chrystie Nose, MD;  Location: [facility] ENDOSCOPY;  Service: Cardiovascular;  Laterality: N/A;   CARDIOVERSION N/A 10/14/2021   Procedure: CARDIOVERSION;  Surgeon: Little Ishikawa, MD;  Location: [facility] ENDOSCOPY;  Service: Cardiovascular;  Laterality: N/A;   CARDIOVERSION N/A 11/20/2021   Procedure: CARDIOVERSION;  Surgeon: Tenny Craw,  Sherol Dade, MD;  Location: [facility] ENDOSCOPY;  Service: Cardiovascular;  Laterality: N/A;   CATARACT EXTRACTION W/ INTRAOCULAR LENS  IMPLANT, BILATERAL Bilateral 2013   COLONOSCOPY  2013   EYE SURGERY Bilateral    cataracts   LAPAROSCOPIC CHOLECYSTECTOMY  1997   MINIMALLY INVASIVE MAZE PROCEDURE N/A 12/13/2018   Procedure: MINIMALLY INVASIVE MAZE PROCEDURE using a 45 MM AtriClip.;  Surgeon: Purcell Nails, MD;  Location: [facility] OR;  Service: Open Heart Surgery;  Laterality: N/A;   MITRAL VALVE REPLACEMENT Right 12/13/2018   Procedure: MINIMALLY INVASIVE MITRAL VALVE (MV) REPLACEMENT using a Magna Mitral Ease 31 MM Valve.;  Surgeon: Purcell Nails, MD;  Location: [facility] OR;   Service: Open Heart Surgery;  Laterality: Right;   RIGHT/LEFT HEART CATH AND CORONARY ANGIOGRAPHY N/A 10/18/2018   Procedure: RIGHT/LEFT HEART CATH AND CORONARY ANGIOGRAPHY;  Surgeon: Marykay Lex, MD;  Location[facility] INVASIVE CV LAB;  Service: Cardiovascular;  Laterality: N/A;   TEE WITHOUT CARDIOVERSION N/A 06/23/2017   Procedure: TRANSESOPHAGEAL ECHOCARDIOGRAM (TEE);  Surgeon: Pricilla Riffle, MD;  Location: [facility] ENDOSCOPY;  Service: Cardiovascular;  Laterality: N/A;   TEE WITHOUT CARDIOVERSION N/A 10/17/2018   Procedure: TRANSESOPHAGEAL ECHOCARDIOGRAM (TEE);  Surgeon: Jake Bathe, MD;  Location: [facility] ENDOSCOPY;  Service: Cardiovascular;  Laterality: N/A;   TEE WITHOUT CARDIOVERSION N/A 12/13/2018   Procedure: TRANSESOPHAGEAL ECHOCARDIOGRAM (TEE);  Surgeon: Purcell Nails, MD;  Location: [facility] OR;  Service: Open Heart Surgery;  Laterality: N/A;   TONSILLECTOMY AND ADENOIDECTOMY  1990s   UVULOPALATOPHARYNGOPLASTY, TONSILLECTOMY AND SEPTOPLASTY  1990s    Current Outpatient Medications  Medication Sig Dispense Refill   acetaminophen (TYLENOL) 500 MG tablet Take 500 mg by mouth daily as needed for moderate pain.     ALPRAZolam (XANAX) 0.25 MG tablet Take 0.125-0.25 mg by mouth See admin instructions. Take 0.25 mg at night, may take 0.125 mg dose as needed for anxiety     atorvastatin (LIPITOR) 10 MG tablet Take 10 mg by mouth 2 (two) times a week.     Calcium Citrate-Vitamin D (CALCIUM + D PO) Take 1 tablet by mouth daily.     carvedilol (COREG) 3.125 MG tablet TAKE 1 TABLET BY MOUTH TWICE  DAILY WITH MEALS 180 tablet 3   chlorpheniramine (CHLOR-TRIMETON) 4 MG tablet Take 1 tablet (4 mg total) by mouth at bedtime. 14 tablet 0   Cholecalciferol (VITAMIN D) 50 MCG (2000 UT) tablet Take 2,000 Units by mouth daily.     conjugated estrogens (PREMARIN) vaginal cream Place 1 Applicatorful vaginally 3 (three) times a week.     cyanocobalamin (VITAMIN B12) 1000 MCG tablet Take 1,000 mcg by mouth daily.      diltiazem (CARDIZEM CD) 120 MG 24 hr capsule TAKE 1 CAPSULE (120 MG TOTAL) BY MOUTH AT BEDTIME. 90 capsule 2   diltiazem (CARDIZEM) 30 MG tablet Take 1 tablet (30 mg total) by mouth 4 (four) times daily as needed (for heart rates greater than 100). 60 tablet 3   dofetilide (TIKOSYN) 500 MCG capsule Take 1 capsule (500 mcg total) by mouth 2 (two) times daily. 180 capsule 3   dorzolamide-timolol (COSOPT) 22.3-6.8 MG/ML ophthalmic solution Place 1 drop into both eyes 2 (two) times daily.     ELIQUIS 5 MG TABS tablet TAKE 1 TABLET BY MOUTH TWICE  DAILY 180 tablet 3   empagliflozin (JARDIANCE) 10 MG TABS tablet Take 1 tablet (10 mg total) by mouth daily before breakfast.     fluticasone (FLONASE) 50 MCG/ACT nasal spray  Place 1 spray into both nostrils daily.     furosemide (LASIX) 20 MG tablet Take 1 tablet (20 mg total) by mouth daily as needed for fluid. For >3lb/overnight or >5lb/weekly note dose 30 tablet 4   gabapentin (NEURONTIN) 300 MG capsule Take 300 mg by mouth 3 (three) times daily.     hydrALAZINE (APRESOLINE) 25 MG tablet Take 1 tablet (25 mg total) by mouth in the morning and at bedtime. 180 tablet 3   latanoprost (XALATAN) 0.005 % ophthalmic solution Place 1 drop into both eyes at bedtime.   6   Lidocaine-Glycerin (PREPARATION H EX) Apply 1 Application topically daily as needed (hemorrhoids).     magnesium oxide (MAG-OX) 400 (240 Mg) MG tablet Take 1 tablet (400 mg total) by mouth every other day.     meclizine (ANTIVERT) 25 MG tablet Take 25 mg by mouth as needed for dizziness or nausea.     Multiple Vitamin (MULTIVITAMIN WITH MINERALS) TABS tablet Take 1 tablet by mouth daily.     NON FORMULARY Apply 1 application topically 2 (two) times daily as needed (for eczema). Traimcinolone/[employer] Moist Cream     oxybutynin (DITROPAN-XL) 10 MG 24 hr tablet Take 10 mg by mouth daily.     pantoprazole (PROTONIX) 40 MG tablet Take 40 mg by mouth daily.     polyethylene glycol (MIRALAX / GLYCOLAX) 17 g  packet Take 17 g by mouth every other day.     potassium chloride (KLOR-CON) 10 MEQ tablet Take 1 tablet (10 mEq total) by mouth daily as needed (when you take Lasix). 30 tablet 3   PRESCRIPTION MEDICATION Inhale into the lungs at bedtime. CPAP     Probiotic Product (PROBIOTIC PO) Take 1 capsule by mouth daily.     promethazine (PHENERGAN) 12.5 MG tablet Take 1 tablet (12.5 mg total) by mouth 2 (two) times daily as needed for nausea or vomiting. 10 tablet 0   sodium chloride (OCEAN) 0.65 % SOLN nasal spray Place 1 spray into both nostrils at bedtime.     valsartan (DIOVAN) 160 MG tablet TAKE 1 TABLET BY MOUTH TWICE  DAILY 180 tablet 3   No current facility-administered medications for this visit.    Allergies:   Tizanidine, Amoxicillin, Metoprolol tartrate, and Oxaprozin   Social History:  The patient  reports that she has never smoked. She has never used smokeless tobacco. She reports current alcohol use. She reports that she does not use drugs.   Family History:  The patient's family history includes Diabetes in her brother; Heart attack in her mother; Heart failure in her maternal grandfather; Hypertension in her brother; Kidney disease in her brother.  ROS:  Please see the history of present illness.    All other systems are reviewed and otherwise negative.   PHYSICAL EXAM:  VS:  There were no vitals taken for this visit. BMI: There is no height or weight on file to calculate BMI. Well nourished, well developed, in no acute distress HEENT: normocephalic, atraumatic Neck: no JVD, carotid bruits or masses Cardiac:  RRR; bradycardic, no significant murmurs, no rubs, or gallops Lungs: CTA b/l, no wheezing, rhonchi or rales Abd: soft, nontender MS: no deformity or atrophy Ext: no edema Skin: warm and dry, no rash Neuro:  No gross deficits appreciated Psych: euthymic mood, full affect   EKG:  Done today and reviewed by myself shows  Today:     SB 58bpm, manually measured QT ,  QTc , 1st degree AVBlock  12/20/22: SB 56bpm, QTc , no acute/ischemic changes  Aug 2024 monitor Patient had a min HR of 37 bpm, max HR of 131 bpm, and avg HR of 59 bpm.  Predominant underlying rhythm was Sinus Rhythm.  Atrial Flutter/SVT occurred (13% burden), ranging from 39-131 bpm (avg of 81 bpm), the longest 1 day 20 hours with an avg rate of 81 bpm.  Triggered episodes associated with atrial flutter/SVT Less than 1% ventricular and supraventricular ectopy   12/07/21: TTE 1. Left ventricular ejection fraction, by estimation, is 55 to 60%. Left  ventricular ejection fraction by 3D volume is 55 %. The left ventricle has  normal function. The left ventricle has no regional wall motion  abnormalities. Left ventricular diastolic   function could not be evaluated. Elevated left ventricular end-diastolic  pressure. The average left ventricular global longitudinal strain is -16.6  %. The global longitudinal strain is normal.   2. Right ventricular systolic function is normal. The right ventricular  size is normal. There is normal pulmonary artery systolic pressure. The  estimated right ventricular systolic pressure is 25.8 mmHg.   3. Left atrial size was mildly dilated.   4. The mitral valve has been repaired/replaced. No evidence of mitral  valve regurgitation. No evidence of mitral stenosis. The mean mitral valve  gradient is 3.0 mmHg at a HR of 52bpm. Echo findings are consistent with  normal structure and function of  the mitral valve prosthesis.   5. The aortic valve is tricuspid. There is moderate calcification of the  aortic valve. There is moderate thickening of the aortic valve. Aortic  valve regurgitation is mild. Aortic valve sclerosis/calcification is  present, without any evidence of aortic  stenosis. Aortic regurgitation PHT measures 448 msec.   6. Pulmonic valve regurgitation is moderate.   7. The inferior vena cava is normal in size with greater than 50%   respiratory variability, suggesting right atrial pressure of 3 mmHg.    09/24/20: TTE IMPRESSIONS   1. Left ventricular ejection fraction, by estimation, is 60 to 65%. The  left ventricle has normal function. The left ventricle has no regional  wall motion abnormalities. Left ventricular diastolic parameters are  indeterminate.   2. Right ventricular systolic function is normal. The right ventricular  size is normal. There is moderately elevated pulmonary artery systolic  pressure.   3. Left atrial size was moderately dilated.   4. The mitral valve has been repaired/replaced. Mild mitral valve  regurgitation. No evidence of mitral stenosis. The mean mitral valve  gradient is 2.8 mmHg with average heart rate of 55 bpm. Echo findings are  consistent with normal structure and  function of the mitral valve prosthesis.   5. The aortic valve is tricuspid. There is mild calcification of the  aortic valve. There is mild thickening of the aortic valve. Aortic valve  regurgitation is mild. Mild aortic valve sclerosis is present, with no  evidence of aortic valve stenosis.  Aortic valve area, by VTI measures 2.29 cm. Aortic valve mean gradient  measures 6.5 mmHg. Aortic valve Vmax measures 1.69 m/s.   6. The inferior vena cava is normal in size with greater than 50%  respiratory variability, suggesting right atrial pressure of 3 mmHg.    TTE 01/23/19: 1. Left ventricular ejection fraction, by visual estimation, is 55 to  60%. The left ventricle has normal function. Left ventricular septal wall  thickness was mildly increased. Moderately increased left ventricular  posterior wall thickness. There is  mildly increased left  ventricular hypertrophy.   2. Left ventricular diastolic parameters are indeterminate.   3. The left ventricle demonstrates regional wall motion abnormalities.   4. Elevated LVEDP. Hypokinesis of the basal septum consistent with  post-operative state.   5. Global right  ventricle has normal systolic function.The right  ventricular size is normal. No increase in right ventricular wall  thickness.   6. Left atrial size was severely dilated.   7. Right atrial size was normal.   8. The mitral valve has been repaired/replaced. No evidence of mitral  valve regurgitation. No evidence of mitral stenosis.   9. The tricuspid valve is normal in structure.  10. The aortic valve is tricuspid. Aortic valve regurgitation is mild. No  evidence of aortic valve sclerosis or stenosis.  11. The pulmonic valve was normal in structure. Pulmonic valve  regurgitation is not visualized.  12. Normal pulmonary artery systolic pressure.  13. The inferior vena cava is normal in size with <50% respiratory  variability, suggesting right atrial pressure of 8 mmHg.      10/18/2018: [facility] Hemodynamic findings consistent with mild pulmonary hypertension. LV end diastolic pressure is normal. There is severe mitral valve stenosis -suggested by a echocardiogram, large PCWP V waves along with elevated PCWP but normal LVEDP would also suggest this. Angiographically normal coronary arteries   SUMMARY Angiographically normal coronary arteries Mild pulmonary hypertension with mildly elevated PCWP but normal LVEDP suggestive of mitral stenosis Significant V wave noted on PCWP waveform suggesting mitral stenosis.   RECOMMENDATIONS Return to nursing unit for ongoing care.   Proceed with plans for mitral valve replacement   06/24/2017 EPS/ablation CONCLUSIONS: 1. Sinus rhythm upon presentation.   2. Successful electrical isolation and anatomical encircling of all four pulmonary veins with radiofrequency current.    3. Cavo-tricuspid isthmus ablation was performed with complete bidirectional isthmus block achieved.  4. No inducible arrhythmias following ablation both on and off of dobutamine 5. No early apparent complications.   03/10/2017: EPS/ablation CONCLUSIONS: 1. Atrial fibrillation  upon presentation.   2. Successful electrical isolation and anatomical encircling of all four pulmonary veins with radiofrequency current.  A WACA approach was used 3. Additional left atrial ablation was performed with a left atrial roof line  4. Atrial fibrillation successfully cardioverted to sinus rhythm. 5. No early apparent complications.     Recent Labs: 12/20/2022: Magnesium 2.3 01/18/2023: ALT 22; BUN 12; Creatinine, Ser 0.86; Hemoglobin 13.8; Platelets 225; Potassium 3.5; Sodium 138  No results found for requested labs within last 365 days.   CrCl cannot be calculated (Patient's most recent lab result is older than the maximum 21 days allowed.).   Wt Readings from Last 3 Encounters:  01/17/23 159 lb 4.8 oz (72.3 kg)  12/31/22 160 lb 1.6 oz (72.6 kg)  12/20/22 159 lb (72.1 kg)     Other studies reviewed: Additional studies/records reviewed today include: summarized above  ASSESSMENT AND PLAN:  1. Persistent AFib 2. SVT 3. AFlutter CHA2DS2Vasc is 4, on Eliquis, appropriately dosed     On Tikosyn, w/stable QTc  She had been prescribed phenergan and taken a single dose Revisit Tikosyn teaching/advised to always double check with pharmacist or [place]  She was intolerant of amiodarone with nausea Discussed stopping Tikosyn >> though if we needed to return to it she would need to be re-hospitalized For now, will continue  She has some cardiac awareness at night, rates 70's Aug 2024 she wore a monitor with 13% AFib/flutter rates 80's  4. MVR (bioprosthetic) 5.  Chronic pericardial effusion (trivial by last echo 2022)     Stable by last echo 2022     C/w Dr. Duke Salvia  6. HTN     No changes today  7. Secondary hypercoagulable state  8. Diastolic dyssfunction Advised to stay off Jardiance > f/u with cards team  9. Nausea/chills continue intermittently Advised to see her PMD, recheck her urin, evaluate for causes     Disposition: back in 4 mo, sooner if  needed  Current medicines are reviewed at length with the patient today.  The patient did not have any concerns regarding medicines.  Norma Fredrickson, PA-C 03/17/2023 1:32 PM     [facility] [address] [place] [address] [phone] (office)  [phone] (fax)

## 2023-03-22 ENCOUNTER — Ambulatory Visit: Payer: Medicare Other | Attending: Physician Assistant | Admitting: Physician Assistant

## 2023-03-22 ENCOUNTER — Telehealth: Payer: Self-pay | Admitting: *Deleted

## 2023-03-22 ENCOUNTER — Encounter: Payer: Self-pay | Admitting: Physician Assistant

## 2023-03-22 VITALS — BP 134/70 | HR 58 | Ht 62.0 in | Wt 160.0 lb

## 2023-03-22 DIAGNOSIS — Z5181 Encounter for therapeutic drug level monitoring: Secondary | ICD-10-CM | POA: Diagnosis not present

## 2023-03-22 DIAGNOSIS — I471 Supraventricular tachycardia, unspecified: Secondary | ICD-10-CM

## 2023-03-22 DIAGNOSIS — I4819 Other persistent atrial fibrillation: Secondary | ICD-10-CM | POA: Diagnosis not present

## 2023-03-22 DIAGNOSIS — Z952 Presence of prosthetic heart valve: Secondary | ICD-10-CM

## 2023-03-22 DIAGNOSIS — D6869 Other thrombophilia: Secondary | ICD-10-CM

## 2023-03-22 DIAGNOSIS — Z79899 Other long term (current) drug therapy: Secondary | ICD-10-CM

## 2023-03-22 DIAGNOSIS — I4892 Unspecified atrial flutter: Secondary | ICD-10-CM | POA: Diagnosis not present

## 2023-03-22 DIAGNOSIS — I10 Essential (primary) hypertension: Secondary | ICD-10-CM

## 2023-03-22 MED ORDER — POTASSIUM CHLORIDE ER 10 MEQ PO TBCR: 10.0000 meq | EXTENDED_RELEASE_TABLET | Freq: Every day | ORAL | 3 refills | Status: AC | PRN

## 2023-03-22 MED ORDER — FUROSEMIDE 20 MG PO TABS: 20.0000 mg | ORAL_TABLET | Freq: Every day | ORAL | 4 refills | Status: AC | PRN

## 2023-03-22 NOTE — Patient Instructions (Addendum)
 Medication Instructions:   STOP TAKING:  JARDIANCE   STOP TAKING  PHENERGAN  *If you need a refill on your cardiac medications before your next appointment, please call your pharmacy*    Lab Work:   PLEASE GO DOWN STAIRS  [facility]  FIRST FLOOR  [address] ( GET OFF ELEVATORS MAKE A LEFT AND ANOTHER LEFT LAB ON RIGHT DOWN HALLWAY : BMET AND MAG TODAY     If you have labs (blood work) drawn today and your tests are completely normal, you will receive your results only by: MyChart Message (if you have MyChart) OR A paper copy in the mail If you have any lab test that is abnormal or we need to change your treatment, we will call you to review the results.   Testing/Procedures: NONE ORDERED  TODAY    Follow-Up: At [facility], you and your health needs are our priority.  As part of our continuing mission to provide you with exceptional heart care, we have created designated Provider Care Teams.  These Care Teams include your primary Cardiologist (physician) and Advanced Practice Providers (APPs -  Physician Assistants and Nurse Practitioners) who all work together to provide you with the care you need, when you need it.  We recommend signing up for the patient portal called "MyChart".  Sign up information is provided on this After Visit Summary.  MyChart is used to connect with patients for Virtual Visits (Telemedicine).  Patients are able to view lab/test results, encounter notes, upcoming appointments, etc.  Non-urgent messages can be sent to your provider as well.   To learn more about what you can do with MyChart, go to [email or web address].    Your next appointment:  SEE CAITLYN  DRAWBRIDGE  NEXT AVAILABLE                                                                                    AND   IN 3 MONTHS ( CONTACT  CASSIE HALL/ ANGELINE HAMMER FOR EP SCHEDULING ISSUES )\  Provider:    Loman Brooklyn, MD or Francis Dowse, PA-C     Other Instructions       1st  Floor: - Lobby - Registration  - Pharmacy  - Lab - Cafe  2nd Floor: - PV Lab - Diagnostic Testing (echo, CT, nuclear med)  3rd Floor: - Vacant  4th Floor: - TCTS (cardiothoracic surgery) - AFib Clinic - Structural Heart Clinic - Vascular Surgery  - Vascular Ultrasound  5th Floor: - [facility] (general and EP) - Clinical Pharmacy for coumadin, hypertension, lipid, weight-loss medications, and med management appointments    Valet parking services will be available as well.

## 2023-03-22 NOTE — Telephone Encounter (Signed)
 ERROR

## 2023-03-23 LAB — MAGNESIUM: Magnesium: 2.3 mg/dL (ref 1.6–2.3)

## 2023-03-23 LAB — BASIC METABOLIC PANEL
BUN/Creatinine Ratio: 15 (ref 12–28)
BUN: 14 mg/dL (ref 8–27)
CO2: 26 mmol/L (ref 20–29)
Calcium: 9.7 mg/dL (ref 8.7–10.3)
Chloride: 102 mmol/L (ref 96–106)
Creatinine, Ser: 0.91 mg/dL (ref 0.57–1.00)
Glucose: 93 mg/dL (ref 70–99)
Potassium: 4.2 mmol/L (ref 3.5–5.2)
Sodium: 142 mmol/L (ref 134–144)
eGFR: 64 mL/min/{1.73_m2} (ref >59)

## 2023-04-15 ENCOUNTER — Other Ambulatory Visit: Payer: Self-pay | Admitting: *Deleted

## 2023-04-15 DIAGNOSIS — Z5181 Encounter for therapeutic drug level monitoring: Secondary | ICD-10-CM

## 2023-04-21 ENCOUNTER — Other Ambulatory Visit: Payer: Self-pay

## 2023-04-21 ENCOUNTER — Emergency Department (HOSPITAL_COMMUNITY)

## 2023-04-21 ENCOUNTER — Inpatient Hospital Stay (HOSPITAL_COMMUNITY)
Admission: EM | Admit: 2023-04-21 | Discharge: 2023-04-25 | DRG: 310 | Disposition: A | Discharge status: Home / Self Care | Attending: Internal Medicine | Admitting: Internal Medicine

## 2023-04-21 ENCOUNTER — Telehealth: Payer: Self-pay | Admitting: Student

## 2023-04-21 DIAGNOSIS — F419 Anxiety disorder, unspecified: Secondary | ICD-10-CM | POA: Diagnosis present

## 2023-04-21 DIAGNOSIS — K649 Unspecified hemorrhoids: Secondary | ICD-10-CM | POA: Diagnosis present

## 2023-04-21 DIAGNOSIS — Z833 Family history of diabetes mellitus: Secondary | ICD-10-CM

## 2023-04-21 DIAGNOSIS — Z6831 Body mass index (BMI) 31.0-31.9, adult: Secondary | ICD-10-CM

## 2023-04-21 DIAGNOSIS — I10 Essential (primary) hypertension: Secondary | ICD-10-CM | POA: Diagnosis present

## 2023-04-21 DIAGNOSIS — Z8249 Family history of ischemic heart disease and other diseases of the circulatory system: Secondary | ICD-10-CM

## 2023-04-21 DIAGNOSIS — I4719 Other supraventricular tachycardia: Principal | ICD-10-CM | POA: Diagnosis present

## 2023-04-21 DIAGNOSIS — G4733 Obstructive sleep apnea (adult) (pediatric): Secondary | ICD-10-CM | POA: Diagnosis present

## 2023-04-21 DIAGNOSIS — I4892 Unspecified atrial flutter: Principal | ICD-10-CM | POA: Diagnosis present

## 2023-04-21 DIAGNOSIS — Z953 Presence of xenogenic heart valve: Secondary | ICD-10-CM

## 2023-04-21 DIAGNOSIS — I272 Pulmonary hypertension, unspecified: Secondary | ICD-10-CM | POA: Diagnosis present

## 2023-04-21 DIAGNOSIS — Z888 Allergy status to other drugs, medicaments and biological substances status: Secondary | ICD-10-CM

## 2023-04-21 DIAGNOSIS — Z79899 Other long term (current) drug therapy: Secondary | ICD-10-CM

## 2023-04-21 DIAGNOSIS — R110 Nausea: Secondary | ICD-10-CM | POA: Diagnosis present

## 2023-04-21 DIAGNOSIS — Z880 Allergy status to penicillin: Secondary | ICD-10-CM

## 2023-04-21 DIAGNOSIS — E6609 Other obesity due to excess calories: Secondary | ICD-10-CM | POA: Diagnosis present

## 2023-04-21 DIAGNOSIS — R002 Palpitations: Secondary | ICD-10-CM | POA: Diagnosis present

## 2023-04-21 DIAGNOSIS — I4819 Other persistent atrial fibrillation: Secondary | ICD-10-CM | POA: Diagnosis present

## 2023-04-21 DIAGNOSIS — E785 Hyperlipidemia, unspecified: Secondary | ICD-10-CM | POA: Diagnosis present

## 2023-04-21 DIAGNOSIS — Z90710 Acquired absence of both cervix and uterus: Secondary | ICD-10-CM

## 2023-04-21 DIAGNOSIS — Z7901 Long term (current) use of anticoagulants: Secondary | ICD-10-CM

## 2023-04-21 DIAGNOSIS — K219 Gastro-esophageal reflux disease without esophagitis: Secondary | ICD-10-CM | POA: Diagnosis present

## 2023-04-21 DIAGNOSIS — Z841 Family history of disorders of kidney and ureter: Secondary | ICD-10-CM

## 2023-04-21 DIAGNOSIS — I080 Rheumatic disorders of both mitral and aortic valves: Secondary | ICD-10-CM | POA: Diagnosis present

## 2023-04-21 LAB — CBC
HCT: 43.3 % (ref 36.0–46.0)
Hemoglobin: 13.9 g/dL (ref 12.0–15.0)
MCH: 30.1 pg (ref 26.0–34.0)
MCHC: 32.1 g/dL (ref 30.0–36.0)
MCV: 93.7 fL (ref 80.0–100.0)
Platelets: 237 10*3/uL (ref 150–400)
RBC: 4.62 MIL/uL (ref 3.87–5.11)
RDW: 14.6 % (ref 11.5–15.5)
WBC: 9.8 10*3/uL (ref 4.0–10.5)
nRBC: 0 % (ref 0.0–0.2)

## 2023-04-21 LAB — BASIC METABOLIC PANEL WITH GFR
Anion gap: 11 (ref 5–15)
BUN: 14 mg/dL (ref 8–23)
CO2: 21 mmol/L — ABNORMAL LOW (ref 22–32)
Calcium: 9.4 mg/dL (ref 8.9–10.3)
Chloride: 108 mmol/L (ref 98–111)
Creatinine, Ser: 0.99 mg/dL (ref 0.44–1.00)
GFR, Estimated: 58 mL/min — ABNORMAL LOW (ref >60)
Glucose, Bld: 113 mg/dL — ABNORMAL HIGH (ref 70–99)
Potassium: 4.1 mmol/L (ref 3.5–5.1)
Sodium: 140 mmol/L (ref 135–145)

## 2023-04-21 LAB — MAGNESIUM: Magnesium: 2.3 mg/dL (ref 1.7–2.4)

## 2023-04-21 MED ORDER — PROPOFOL 10 MG/ML IV BOLUS
0.5000 mg/kg | Freq: Once | INTRAVENOUS | Status: AC
  Administered 2023-04-21: 36.3 mg via INTRAVENOUS
  Filled 2023-04-21: qty 20

## 2023-04-21 NOTE — ED Notes (Signed)
 Patient transported to X-ray

## 2023-04-21 NOTE — ED Notes (Signed)
 Pt heart rate back to 115+. EKG done. MD notified and at bedside.

## 2023-04-21 NOTE — ED Triage Notes (Signed)
 Pt reports concern for palpations that started tonight around 6:30 pm. Associated with SOB. No CP. Hx a fib requires cardioversion. Pt sts taking her prescribed Cardizem and tikosyn PTA.

## 2023-04-21 NOTE — Sedation Documentation (Signed)
Cardioversion 150j

## 2023-04-21 NOTE — Telephone Encounter (Signed)
 Had an elevated heart rate this evening and took 30 mg of cardizem after this her heart rate continued to be elevated so she took scheduled cardizem CD 120, Tikosyn , and Coreg 3.125. Heart rate continued to be elevated in the 120's and blood pressure was 162/84. Reported not missing any doses of Eliquis. Suspect that she went into atrial fibrillation/flutter. Recommended that she come to the emergency department at [facility] to be considered for cardioversion in the emergency room because she has not missed any Eliquis doses.    We have notified the charge nurse in the ER   Arabella Merles PA-C

## 2023-04-21 NOTE — ED Notes (Signed)
 2L [place] O2 on pt. Pt has sleep apnea and sleeps with 2L at night.  Pt used bedside commode with no issues.

## 2023-04-21 NOTE — ED Provider Notes (Signed)
 [place] EMERGENCY DEPARTMENT AT [facility] Provider Note   CSN: [id number] Arrival date & time: 04/21/23  2015     History  Chief Complaint  Patient presents with   Palpitations    Mary Farrell is a 78 y.o. female.   Palpitations    Patient has a history of mitral stenosis rheumatic heart disease endocarditis hypertension atrial flutter paroxysmal atrial fibrillation congestive heart failure.  Patient presents ED for evaluation of tachycardia.  Patient states she has had an elevated heart rate this evening.  She took one of her Cardizem doses as well as her Tikosyn and Coreg.  She continued to have an elevated heart rate patient denies missing any doses of her Eliquis.  She contacted her cardiology team and they suggested she come to the ED for possible cardioversion procedure.  Patient states she has been compliant with all of her cardiac meds including her Eliquis but she has not missed any doses  Home Medications Prior to Admission medications   Medication Sig Start Date End Date Taking? Authorizing Provider  acetaminophen (TYLENOL) 500 MG tablet Take 500 mg by mouth daily as needed for moderate pain.    [provider]  ALPRAZolam Prudy Feeler) 0.25 MG tablet Take 0.125-0.25 mg by mouth See admin instructions. Take 0.25 mg at night, may take 0.125 mg dose as needed for anxiety    [provider]  atorvastatin (LIPITOR) 10 MG tablet Take 10 mg by mouth 2 (two) times a week. 10/01/21   [provider]  Calcium Citrate-Vitamin D (CALCIUM + D PO) Take 1 tablet by mouth daily.    [provider]  carvedilol (COREG) 3.125 MG tablet TAKE 1 TABLET BY MOUTH TWICE  DAILY WITH MEALS 03/14/23   Fenton, Clint R, PA  cetirizine (ZYRTEC) 10 MG tablet Take 10 mg by mouth daily.    [provider]  Cholecalciferol (VITAMIN D) 50 MCG (2000 UT) tablet Take 2,000 Units by mouth daily.    [provider]  conjugated estrogens (PREMARIN)  vaginal cream Place 1 Applicatorful vaginally 3 (three) times a week.    [provider]  cyanocobalamin (VITAMIN B12) 1000 MCG tablet Take 1,000 mcg by mouth daily.    [provider]  diltiazem (CARDIZEM CD) 120 MG 24 hr capsule TAKE 1 CAPSULE (120 MG TOTAL) BY MOUTH AT BEDTIME. 02/04/23   Fenton, Clint R, PA  diltiazem (CARDIZEM) 30 MG tablet Take 1 tablet (30 mg total) by mouth 4 (four) times daily as needed (for heart rates greater than 100). 10/06/21   Camnitz, Andree Coss, MD  dofetilide (TIKOSYN) 500 MCG capsule Take 1 capsule (500 mcg total) by mouth 2 (two) times daily. 07/21/22   Chilton Si, MD  dorzolamide-timolol (COSOPT) 22.3-6.8 MG/ML ophthalmic solution Place 1 drop into both eyes 2 (two) times daily.    [provider]  ELIQUIS 5 MG TABS tablet TAKE 1 TABLET BY MOUTH TWICE  DAILY 09/21/22   Camnitz, Will Daphine Deutscher, MD  fluticasone (FLONASE) 50 MCG/ACT nasal spray Place 1 spray into both nostrils daily.    [provider]  furosemide (LASIX) 20 MG tablet Take 1 tablet (20 mg total) by mouth daily as needed for fluid. For >3lb/overnight or >5lb/weekly note dose 03/22/23   Sheilah Pigeon, PA-C  gabapentin (NEURONTIN) 300 MG capsule Take 300 mg by mouth 3 (three) times daily.    [provider]  hydrALAZINE (APRESOLINE) 25 MG tablet Take 1 tablet (25 mg total) by mouth  in the morning and at bedtime. 07/13/22   Alver Sorrow, NP  latanoprost (XALATAN) 0.005 % ophthalmic solution Place 1 drop into both eyes at bedtime.  06/17/14   [provider]  Lidocaine-Glycerin (PREPARATION H EX) Apply 1 Application topically daily as needed (hemorrhoids).    [provider]  magnesium oxide (MAG-OX) 400 (240 Mg) MG tablet Take 1 tablet (400 mg total) by mouth every other day. 04/16/22   Fenton, Clint R, PA  meclizine (ANTIVERT) 25 MG tablet Take 25 mg by mouth as needed for dizziness or nausea. 01/05/21   [provider]   Multiple Vitamin (MULTIVITAMIN WITH MINERALS) TABS tablet Take 1 tablet by mouth daily.    [provider]  NON FORMULARY Apply 1 application topically 2 (two) times daily as needed (for eczema). Traimcinolone/[employer] Moist Cream    [provider]  oxybutynin (DITROPAN-XL) 10 MG 24 hr tablet Take 10 mg by mouth daily. 12/16/15   [provider]  pantoprazole (PROTONIX) 40 MG tablet Take 40 mg by mouth daily.    [provider]  polyethylene glycol (MIRALAX / GLYCOLAX) 17 g packet Take 17 g by mouth every other day.    [provider]  potassium chloride (KLOR-CON) 10 MEQ tablet Take 1 tablet (10 mEq total) by mouth daily as needed (when you take Lasix). 03/22/23   Sheilah Pigeon, PA-C  PRESCRIPTION MEDICATION Inhale into the lungs at bedtime. CPAP    [provider]  Probiotic Product (PROBIOTIC PO) Take 1 capsule by mouth daily.    [provider]  sodium chloride (OCEAN) 0.65 % SOLN nasal spray Place 1 spray into both nostrils at bedtime.    [provider]  valsartan (DIOVAN) 160 MG tablet TAKE 1 TABLET BY MOUTH TWICE  DAILY 11/26/22   Alver Sorrow, NP      Allergies    Tizanidine, Amoxicillin, Metoprolol tartrate, and Oxaprozin    Review of Systems   Review of Systems  Cardiovascular:  Positive for palpitations.    Physical Exam Updated Vital Signs BP (!) 142/73   Pulse (!) 116   Temp 97.9 F (36.6 C)   Resp 14   Ht 1.575 m (5\' 2" )   Wt 72.6 kg   SpO2 98%   BMI 29.26 kg/m  Physical Exam Vitals and nursing note reviewed.  Constitutional:      General: She is not in acute distress.    Appearance: She is well-developed.  HENT:     Head: Normocephalic and atraumatic.     Right Ear: External ear normal.     Left Ear: External ear normal.  Eyes:     General: No scleral icterus.       Right eye: No discharge.        Left eye: No discharge.     Conjunctiva/sclera: Conjunctivae normal.  Neck:      Trachea: No tracheal deviation.  Cardiovascular:     Rate and Rhythm: Regular rhythm. Tachycardia present.  Pulmonary:     Effort: Pulmonary effort is normal. No respiratory distress.     Breath sounds: Normal breath sounds. No stridor. No wheezing or rales.  Abdominal:     General: Bowel sounds are normal. There is no distension.     Palpations: Abdomen is soft.     Tenderness: There is no abdominal tenderness. There is no guarding or rebound.  Musculoskeletal:        General: No tenderness or deformity.  Cervical back: Neck supple.  Skin:    General: Skin is warm and dry.     Findings: No rash.  Neurological:     General: No focal deficit present.     Mental Status: She is alert.     Cranial Nerves: No cranial nerve deficit, dysarthria or facial asymmetry.     Sensory: No sensory deficit.     Motor: No abnormal muscle tone or seizure activity.     Coordination: Coordination normal.  Psychiatric:        Mood and Affect: Mood normal.     ED Results / Procedures / Treatments   Labs (all labs ordered are listed, but only abnormal results are displayed) Labs Reviewed  BASIC METABOLIC PANEL WITH GFR - Abnormal; Notable for the following components:      Result Value   CO2 21 (*)    Glucose, Bld 113 (*)    GFR, Estimated 58 (*)    All other components within normal limits  CBC  MAGNESIUM    EKG EKG Interpretation Date/Time:  Thursday April 21 2023 22:40:34 EDT Ventricular Rate:  115 PR Interval:  248 QRS Duration:  97 QT Interval:  367 QTC Calculation: 508 R Axis:   113  Text Interpretation: Sinus or ectopic atrial tachycardia , atrail flutter? Ventricular premature complex Prolonged PR interval Repol abnrm, severe global ischemia (LM/MVD) Prolonged QT interval Confirmed by Linwood Dibbles [phone]) on 04/21/2023 10:53:29 PM  Radiology DG Chest 2 View Result Date: 04/21/2023 CLINICAL DATA:  chest pain, shob EXAM: CHEST - 2 VIEW COMPARISON:  Chest x-ray 10/07/2022 CT  cardiac 02/16/2022 FINDINGS: The heart and mediastinal contours are within normal limits. Atrial appendage clip and replaced mitral valve. Atherosclerotic plaque. Lingular atelectasis. No focal consolidation. No pulmonary edema. No pleural effusion. No pneumothorax. No acute osseous abnormality. IMPRESSION: 1. No active cardiopulmonary disease. 2.  Aortic Atherosclerosis (ICD10-I70.0). Electronically Signed   By: Tish Frederickson M.D.   On: 04/21/2023 21:39    Procedures .Cardioversion  Date/Time: 04/21/2023 11:26 PM  Performed by: Linwood Dibbles, MD Authorized by: Linwood Dibbles, MD   Consent:    Consent obtained:  Written   Consent given by:  Patient   Risks discussed:  Cutaneous burn   Alternatives discussed:  No treatment Pre-procedure details:    Cardioversion basis:  Emergent   Rhythm:  Atrial flutter   Electrode placement:  Anterior-posterior Patient sedated: Yes. Refer to sedation procedure documentation for details of sedation.  Attempt one:    Cardioversion mode:  Synchronous   Waveform:  Biphasic   Shock (Joules):  150   Shock outcome:  Conversion to normal sinus rhythm Post-procedure details:    Patient tolerance of procedure:  Tolerated well, no immediate complications .Sedation  Date/Time: 04/21/2023 11:28 PM  Performed by: Linwood Dibbles, MD Authorized by: Linwood Dibbles, MD   Consent:    Consent obtained:  Verbal   Consent given by:  Patient   Risks discussed:  Allergic reaction, dysrhythmia, inadequate sedation, nausea, prolonged hypoxia resulting in organ damage, prolonged sedation necessitating reversal, respiratory compromise necessitating ventilatory assistance and intubation and vomiting   Alternatives discussed:  Analgesia without sedation, anxiolysis and regional anesthesia Universal protocol:    Procedure explained and questions answered to patient or proxy's satisfaction: yes     Relevant documents present and verified: yes     Test results available: yes     Imaging  studies available: yes     Required blood products, implants, devices, and special equipment  available: yes     Site/side marked: yes     Immediately prior to procedure, a time out was called: yes     Patient identity confirmed:  Verbally with patient Indications:    Procedure necessitating sedation performed by:  Physician performing sedation Pre-sedation assessment:    Time since last food or drink:  5   NPO status caution: urgency dictates proceeding with non-ideal NPO status     ASA classification: class 1 - normal, healthy patient     Mouth opening:  3 or more finger widths   Thyromental distance:  4 finger widths   Mallampati score:  I - soft palate, uvula, fauces, pillars visible   Neck mobility: normal     Pre-sedation assessments completed and reviewed: airway patency, cardiovascular function, hydration status, mental status, nausea/vomiting, respiratory function and temperature     Pre-sedation assessments completed and reviewed: pre-procedure pain level not reviewed   A pre-sedation assessment was completed prior to the start of the procedure Immediate pre-procedure details:    Reassessment: Patient reassessed immediately prior to procedure     Reviewed: vital signs, relevant labs/tests and NPO status     Verified: bag valve mask available, emergency equipment available, intubation equipment available, IV patency confirmed, oxygen available and suction available   Procedure details (see MAR for exact dosages):    Preoxygenation:  Nasal cannula   Sedation:  Propofol   Intended level of sedation: deep   Intra-procedure monitoring:  Blood pressure monitoring, cardiac monitor, continuous pulse oximetry, frequent LOC assessments, frequent vital sign checks and continuous capnometry   Intra-procedure events: none     Total Provider sedation time (minutes):  15 Post-procedure details:   A post-sedation assessment was completed following the completion of the procedure.    Attendance: Constant attendance by certified staff until patient recovered     Recovery: Patient returned to pre-procedure baseline     Post-sedation assessments completed and reviewed: airway patency, cardiovascular function, hydration status, mental status, nausea/vomiting, pain level, respiratory function and temperature     Patient is stable for discharge or admission: yes     Procedure completion:  Tolerated well, no immediate complications     Medications Ordered in ED Medications  propofol (DIPRIVAN) 10 mg/mL bolus/IV push 36.3 mg (36.3 mg Intravenous Given 04/21/23 2153)    ED Course/ Medical Decision Making/ A&P Clinical Course as of 04/21/23 2326  Thu Apr 21, 2023  2106 CBC normal [JK]  2144 Magnesium level normal.  Metabolic panel normal.  CBC normal. [JK]  2145 Chest x-ray without acute findings [JK]  2324 Case discussed with Dr Benard Halsted.  WIll see pt in with the ED [JK]  2325 Pt successfully converted but unfortunately back in to her tachycardic rhythm after around 15 minutes [JK]    Clinical Course User Index [JK] Linwood Dibbles, MD                                 Medical Decision Making Problems Addressed: Atrial flutter, unspecified type [facility]): acute illness or injury that poses a threat to life or bodily functions  Amount and/or Complexity of Data Reviewed Labs: ordered. Decision-making details documented in ED Course. Radiology: ordered and independent interpretation performed.  Risk Decision regarding hospitalization.   Patient presented to the ED for evaluation of persistent tachycardia shortness of breath.  Patient does have history of atrial fibrillation atrial flutter.  Patient's EKG was suggestive of possible  atrial flutter versus sinus tach.  She had a similar presentation previously where I saw her back in July of last year.  She underwent cardioversion with good results at that time.  We proceeded with cardioversion here in the ED with patient tolerated well  without difficulty.  However shortly thereafter approximately 15 minutes later patient went back into a tachycardic rhythm.    I will consult with cardiology as patient is still symptomatic        Final Clinical Impression(s) / ED Diagnoses Final diagnoses:  Atrial flutter, unspecified type [facility])    Rx / DC Orders ED Discharge Orders          Ordered    Amb referral to AFIB Clinic        04/21/23 2108              Linwood Dibbles, MD 04/21/23 2330

## 2023-04-21 NOTE — ED Provider Triage Note (Signed)
 Emergency Medicine Provider Triage Evaluation Note  Mary Farrell , a 78 y.o. female  was evaluated in triage.  Pt complains of Pt reports concern for palpations that started tonight around 6:30 pm. Associated with SOB. No CP. Hx a fib requires cardioversion. Pt sts taking her prescribed Cardizem and tikosyn PTA.    Takes eliquis, reports no missed doses.  Review of Systems  Positive: Palpitations, shob Negative:   Physical Exam  BP (!) 171/75 (BP Location: Right Arm)   Pulse (!) 123   Temp 98.1 F (36.7 C)   Resp 18   Ht 5\' 2"  (1.575 m)   Wt 72.6 kg   SpO2 97%   BMI 29.26 kg/m  Gen:   Awake, no distress   Resp:  Normal effort  MSK:   Moves extremities without difficulty  Other:  Tachycardia, irregular rhythm  Medical Decision Making  Medically screening exam initiated at 8:27 PM.  Appropriate orders placed.  JACKLIN ZWICK was informed that the remainder of the evaluation will be completed by another provider, this initial triage assessment does not replace that evaluation, and the importance of remaining in the ED until their evaluation is complete.  Workup initiated in triage    [place] 04/21/23 2029

## 2023-04-22 ENCOUNTER — Encounter (HOSPITAL_COMMUNITY): Payer: Self-pay | Admitting: Internal Medicine

## 2023-04-22 ENCOUNTER — Ambulatory Visit (HOSPITAL_BASED_OUTPATIENT_CLINIC_OR_DEPARTMENT_OTHER): Payer: Medicare Other | Admitting: Family

## 2023-04-22 DIAGNOSIS — R002 Palpitations: Secondary | ICD-10-CM | POA: Diagnosis present

## 2023-04-22 DIAGNOSIS — I4719 Other supraventricular tachycardia: Secondary | ICD-10-CM | POA: Diagnosis present

## 2023-04-22 DIAGNOSIS — R110 Nausea: Secondary | ICD-10-CM | POA: Diagnosis present

## 2023-04-22 DIAGNOSIS — Z841 Family history of disorders of kidney and ureter: Secondary | ICD-10-CM | POA: Diagnosis not present

## 2023-04-22 DIAGNOSIS — Z880 Allergy status to penicillin: Secondary | ICD-10-CM | POA: Diagnosis not present

## 2023-04-22 DIAGNOSIS — E785 Hyperlipidemia, unspecified: Secondary | ICD-10-CM | POA: Diagnosis present

## 2023-04-22 DIAGNOSIS — Z7901 Long term (current) use of anticoagulants: Secondary | ICD-10-CM | POA: Diagnosis not present

## 2023-04-22 DIAGNOSIS — Z90710 Acquired absence of both cervix and uterus: Secondary | ICD-10-CM | POA: Diagnosis not present

## 2023-04-22 DIAGNOSIS — I272 Pulmonary hypertension, unspecified: Secondary | ICD-10-CM | POA: Diagnosis present

## 2023-04-22 DIAGNOSIS — I4819 Other persistent atrial fibrillation: Secondary | ICD-10-CM | POA: Diagnosis present

## 2023-04-22 DIAGNOSIS — Z79899 Other long term (current) drug therapy: Secondary | ICD-10-CM | POA: Diagnosis not present

## 2023-04-22 DIAGNOSIS — Z953 Presence of xenogenic heart valve: Secondary | ICD-10-CM | POA: Diagnosis not present

## 2023-04-22 DIAGNOSIS — I4892 Unspecified atrial flutter: Secondary | ICD-10-CM | POA: Diagnosis present

## 2023-04-22 DIAGNOSIS — Z6831 Body mass index (BMI) 31.0-31.9, adult: Secondary | ICD-10-CM | POA: Diagnosis not present

## 2023-04-22 DIAGNOSIS — R9431 Abnormal electrocardiogram [ECG] [EKG]: Secondary | ICD-10-CM | POA: Diagnosis not present

## 2023-04-22 DIAGNOSIS — K649 Unspecified hemorrhoids: Secondary | ICD-10-CM | POA: Diagnosis present

## 2023-04-22 DIAGNOSIS — I080 Rheumatic disorders of both mitral and aortic valves: Secondary | ICD-10-CM | POA: Diagnosis present

## 2023-04-22 DIAGNOSIS — Z888 Allergy status to other drugs, medicaments and biological substances status: Secondary | ICD-10-CM | POA: Diagnosis not present

## 2023-04-22 DIAGNOSIS — E6609 Other obesity due to excess calories: Secondary | ICD-10-CM | POA: Diagnosis present

## 2023-04-22 DIAGNOSIS — Z8249 Family history of ischemic heart disease and other diseases of the circulatory system: Secondary | ICD-10-CM | POA: Diagnosis not present

## 2023-04-22 DIAGNOSIS — F419 Anxiety disorder, unspecified: Secondary | ICD-10-CM | POA: Diagnosis present

## 2023-04-22 DIAGNOSIS — Z833 Family history of diabetes mellitus: Secondary | ICD-10-CM | POA: Diagnosis not present

## 2023-04-22 DIAGNOSIS — I10 Essential (primary) hypertension: Secondary | ICD-10-CM | POA: Diagnosis present

## 2023-04-22 DIAGNOSIS — K219 Gastro-esophageal reflux disease without esophagitis: Secondary | ICD-10-CM | POA: Diagnosis present

## 2023-04-22 DIAGNOSIS — G4733 Obstructive sleep apnea (adult) (pediatric): Secondary | ICD-10-CM | POA: Diagnosis present

## 2023-04-22 MED ORDER — MAGNESIUM OXIDE -MG SUPPLEMENT 400 (240 MG) MG PO TABS
400.0000 mg | ORAL_TABLET | ORAL | Status: DC
  Administered 2023-04-22 – 2023-04-24 (×2): 400 mg via ORAL
  Filled 2023-04-22 (×3): qty 1

## 2023-04-22 MED ORDER — IRBESARTAN 150 MG PO TABS
150.0000 mg | ORAL_TABLET | Freq: Every day | ORAL | Status: DC
  Administered 2023-04-22 – 2023-04-25 (×4): 150 mg via ORAL
  Filled 2023-04-22 (×4): qty 1

## 2023-04-22 MED ORDER — GABAPENTIN 300 MG PO CAPS
300.0000 mg | ORAL_CAPSULE | Freq: Three times a day (TID) | ORAL | Status: DC
  Administered 2023-04-22 – 2023-04-25 (×9): 300 mg via ORAL
  Filled 2023-04-22 (×10): qty 1

## 2023-04-22 MED ORDER — POLYETHYLENE GLYCOL 3350 17 G PO PACK
17.0000 g | PACK | ORAL | Status: DC
  Filled 2023-04-22: qty 1

## 2023-04-22 MED ORDER — ADULT MULTIVITAMIN W/MINERALS CH
1.0000 | ORAL_TABLET | Freq: Every day | ORAL | Status: DC
  Administered 2023-04-22 – 2023-04-25 (×4): 1 via ORAL
  Filled 2023-04-22 (×4): qty 1

## 2023-04-22 MED ORDER — VITAMIN B-12 1000 MCG PO TABS
1000.0000 ug | ORAL_TABLET | ORAL | Status: DC
  Administered 2023-04-25: 1000 ug via ORAL
  Filled 2023-04-22: qty 1

## 2023-04-22 MED ORDER — ESTRADIOL 10 MCG VA TABS: 1.0000 | ORAL_TABLET | Freq: Every day | VAGINAL | Status: DC

## 2023-04-22 MED ORDER — ALPRAZOLAM 0.25 MG PO TABS
0.2500 mg | ORAL_TABLET | Freq: Every day | ORAL | Status: DC
  Administered 2023-04-22 – 2023-04-24 (×3): 0.25 mg via ORAL
  Filled 2023-04-22 (×3): qty 1

## 2023-04-22 MED ORDER — LATANOPROST 0.005 % OP SOLN
1.0000 [drp] | Freq: Every day | OPHTHALMIC | Status: DC
  Administered 2023-04-22 – 2023-04-24 (×3): 1 [drp] via OPHTHALMIC
  Filled 2023-04-22: qty 2.5

## 2023-04-22 MED ORDER — OYSTER SHELL CALCIUM/D3 500-5 MG-MCG PO TABS
1.0000 | ORAL_TABLET | Freq: Every day | ORAL | Status: DC
  Administered 2023-04-23 – 2023-04-25 (×3): 1 via ORAL
  Filled 2023-04-22 (×6): qty 1

## 2023-04-22 MED ORDER — APIXABAN 5 MG PO TABS
5.0000 mg | ORAL_TABLET | Freq: Two times a day (BID) | ORAL | Status: DC
  Administered 2023-04-22 – 2023-04-25 (×7): 5 mg via ORAL
  Filled 2023-04-22 (×7): qty 1

## 2023-04-22 MED ORDER — DILTIAZEM HCL 30 MG PO TABS
30.0000 mg | ORAL_TABLET | Freq: Once | ORAL | Status: AC
  Administered 2023-04-22: 30 mg via ORAL
  Filled 2023-04-22: qty 1

## 2023-04-22 MED ORDER — PANTOPRAZOLE SODIUM 40 MG PO TBEC
40.0000 mg | DELAYED_RELEASE_TABLET | Freq: Two times a day (BID) | ORAL | Status: DC
  Administered 2023-04-22 – 2023-04-25 (×7): 40 mg via ORAL
  Filled 2023-04-22 (×7): qty 1

## 2023-04-22 MED ORDER — CARVEDILOL 3.125 MG PO TABS: 3.1250 mg | ORAL_TABLET | Freq: Two times a day (BID) | ORAL | Status: DC

## 2023-04-22 MED ORDER — ACETAMINOPHEN 500 MG PO TABS: 500.0000 mg | ORAL_TABLET | Freq: Every day | ORAL | Status: DC | PRN

## 2023-04-22 MED ORDER — LORATADINE 10 MG PO TABS
10.0000 mg | ORAL_TABLET | Freq: Every day | ORAL | Status: DC
  Administered 2023-04-22 – 2023-04-25 (×4): 10 mg via ORAL
  Filled 2023-04-22 (×4): qty 1

## 2023-04-22 MED ORDER — METOPROLOL TARTRATE 25 MG PO TABS
25.0000 mg | ORAL_TABLET | Freq: Four times a day (QID) | ORAL | Status: DC
  Administered 2023-04-22 – 2023-04-23 (×5): 25 mg via ORAL
  Filled 2023-04-22 (×5): qty 1

## 2023-04-22 MED ORDER — DORZOLAMIDE HCL-TIMOLOL MAL 2-0.5 % OP SOLN
1.0000 [drp] | Freq: Two times a day (BID) | OPHTHALMIC | Status: DC
  Administered 2023-04-22 – 2023-04-25 (×7): 1 [drp] via OPHTHALMIC
  Filled 2023-04-22: qty 10

## 2023-04-22 MED ORDER — DOFETILIDE 500 MCG PO CAPS
500.0000 ug | ORAL_CAPSULE | Freq: Two times a day (BID) | ORAL | Status: DC
  Administered 2023-04-22 – 2023-04-25 (×7): 500 ug via ORAL
  Filled 2023-04-22 (×11): qty 1

## 2023-04-22 MED ORDER — HYDRALAZINE HCL 25 MG PO TABS
25.0000 mg | ORAL_TABLET | Freq: Two times a day (BID) | ORAL | Status: DC
Start: 2023-04-22 — End: 2023-04-25
  Administered 2023-04-22 – 2023-04-25 (×7): 25 mg via ORAL
  Filled 2023-04-22 (×7): qty 1

## 2023-04-22 MED ORDER — DILTIAZEM HCL ER COATED BEADS 120 MG PO CP24
120.0000 mg | ORAL_CAPSULE | Freq: Every day | ORAL | Status: DC
  Administered 2023-04-22 – 2023-04-24 (×3): 120 mg via ORAL
  Filled 2023-04-22 (×4): qty 1

## 2023-04-22 NOTE — Consult Note (Addendum)
 Cardiology Consultation   Patient ID: Mary Farrell MRN: [id number]; DOB: [birth year]  Admit date: 04/21/2023 Date of Consult: 04/22/2023  PCP:  Daisy Floro, MD   [facility] Providers Cardiologist:  Chilton Si, MD  Electrophysiologist:  Shonya Sumida Jorja Loa, MD       Patient Profile:   Mary Farrell is a 78 y.o. 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 Hulan Saas.   AFib/AAD Amiodarone not tolerated 2/2 GI  Flecainide 2017 >> failed and had PR prolongation PVI ablation 03/10/2017 CTI/PVI Ablation May 2019 MAZE 2020 Tikosyn Jan 2021 PVI ablation 02/23/22  History of Present Illness:   Mary Farrell was doing well, had been unusually busy yesterday though having a good day, once home, fatigued and went to rest beefore bringing in the groceries.  Developed sudden onset pounding/fast heart beat, not like her AFib, but definitely not normal and came in. No CP, SOB, no syncope Reported good medication compliance She was found in suspect SVT and cardioverted to SR Planned for discharge though had recurrent arrhythmia, cardiology consulted > admitted  LABS K+ 4.1 Mag 2.3 Creat 0.99 (cal creat cl 53 (baseline creat ~0.8) WBC 9.8 H/H 13/43 Plts 237    Past Medical History:  Diagnosis Date   Anxiety 06/29/2012   Aortic regurgitation 08/13/2015   Arthritis    "hands, wrists, back, feet, toes" (06/24/2017)   Atrial flutter (HCC) 09/18/2015   Atypical chest pain 05/13/2016   Chest pain    remote cath in 1996 with NORMAL coronaries noted   CHF (congestive heart failure) (HCC)    Dyspnea 10/30/2010   Essential hypertension 09/05/2013   Exogenous obesity    history of    GERD (gastroesophageal reflux disease)    Glaucoma, both eyes    Headache, migraine    "stopped in my  50's" (09/18/2015)   History of cardiovascular stress test 2007   showing no ischemia   Hypertension    Mitral stenosis and aortic insufficiency 06/23/2010   Mitral stenosis with insufficiency    Mitral stenosis with regurgitation    Murmur    of mitral stenosis and moderate aortic insufficiency       Nausea 05/01/2018   Obesity (BMI 30.0-34.9) 02/13/2021   OSA on CPAP    Palpitations    occasional   Paroxysmal A-fib (HCC) 06/24/2017   Pericardial effusion 09/18/2015   Persistent atrial fibrillation (HCC) 03/10/2017   Personal history of rheumatic heart disease    S/P minimally-invasive maze operation for atrial fibrillation 12/13/2018   Complete bilateral atrial lesion set using cryothermy and bipolar radiofrequency ablation with clipping of LA appendage via right mini-thoracotomy approach   S/P minimally-invasive mitral valve replacement with bioprosthetic valve 12/13/2018   31 mm [facility] Mitral stented bovine pericardial tissue valve   SBE (subacute bacterial endocarditis)    prophylaxis, patient unaware   Sliding hiatal hernia    Stress 02/13/2021   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   Vertigo 05/01/2018    Past Surgical History:  Procedure Laterality Date   ABDOMINAL HYSTERECTOMY  1975   APPENDECTOMY  1977   ATRIAL FIBRILLATION ABLATION N/A 03/10/2017   Procedure: ATRIAL FIBRILLATION ABLATION;  Surgeon: Regan Lemming, MD;  Location: [facility] INVASIVE CV LAB;  Service: Cardiovascular;  Laterality: N/A;   ATRIAL FIBRILLATION  ABLATION  06/24/2017   ATRIAL FIBRILLATION ABLATION N/A 06/24/2017   Procedure: ATRIAL FIBRILLATION ABLATION;  Surgeon: Regan Lemming, MD;  Location: [facility] INVASIVE CV LAB;  Service: Cardiovascular;  Laterality: N/A;   ATRIAL FIBRILLATION ABLATION N/A 02/23/2022   Procedure: ATRIAL FIBRILLATION ABLATION;  Surgeon: Regan Lemming, MD;  Location: [facility] INVASIVE CV LAB;  Service: Cardiovascular;  Laterality: N/A;   BUNIONECTOMY Right 2000s    CARDIAC CATHETERIZATION  01/13/1995   normal coronary anatomy and mild mitral stenosis and mild pulmonary hypertension   CARDIOVERSION N/A 09/22/2015   Procedure: CARDIOVERSION;  Surgeon: Jake Bathe, MD;  Location: [facility] ENDOSCOPY;  Service: Cardiovascular;  Laterality: N/A;   CARDIOVERSION N/A 10/25/2016   Procedure: CARDIOVERSION;  Surgeon: Pricilla Riffle, MD;  Location: [facility] ENDOSCOPY;  Service: Cardiovascular;  Laterality: N/A;   CARDIOVERSION N/A 04/15/2017   Procedure: CARDIOVERSION;  Surgeon: Wendall Stade, MD;  Location: [facility] ENDOSCOPY;  Service: Cardiovascular;  Laterality: N/A;   CARDIOVERSION N/A 05/16/2017   Procedure: CARDIOVERSION;  Surgeon: Chrystie Nose, MD;  Location: [facility] ENDOSCOPY;  Service: Cardiovascular;  Laterality: N/A;   CARDIOVERSION N/A 01/24/2019   Procedure: CARDIOVERSION;  Surgeon: Chrystie Nose, MD;  Location: [facility] ENDOSCOPY;  Service: Cardiovascular;  Laterality: N/A;   CARDIOVERSION N/A 10/14/2021   Procedure: CARDIOVERSION;  Surgeon: Little Ishikawa, MD;  Location: [facility] ENDOSCOPY;  Service: Cardiovascular;  Laterality: N/A;   CARDIOVERSION N/A 11/20/2021   Procedure: CARDIOVERSION;  Surgeon: Pricilla Riffle, MD;  Location: [facility] ENDOSCOPY;  Service: Cardiovascular;  Laterality: N/A;   CATARACT EXTRACTION W/ INTRAOCULAR LENS  IMPLANT, BILATERAL Bilateral 2013   COLONOSCOPY  2013   EYE SURGERY Bilateral    cataracts   LAPAROSCOPIC CHOLECYSTECTOMY  1997   MINIMALLY INVASIVE MAZE PROCEDURE N/A 12/13/2018   Procedure: MINIMALLY INVASIVE MAZE PROCEDURE using a 45 MM AtriClip.;  Surgeon: Purcell Nails, MD;  Location: [facility] OR;  Service: Open Heart Surgery;  Laterality: N/A;   MITRAL VALVE REPLACEMENT Right 12/13/2018   Procedure: MINIMALLY INVASIVE MITRAL VALVE (MV) REPLACEMENT using a Magna Mitral Ease 31 MM Valve.;  Surgeon: Purcell Nails, MD;  Location: [facility] OR;  Service: Open Heart Surgery;  Laterality: Right;   RIGHT/LEFT HEART CATH AND CORONARY ANGIOGRAPHY N/A  10/18/2018   Procedure: RIGHT/LEFT HEART CATH AND CORONARY ANGIOGRAPHY;  Surgeon: Marykay Lex, MD;  Location: [facility] INVASIVE CV LAB;  Service: Cardiovascular;  Laterality: N/A;   TEE WITHOUT CARDIOVERSION N/A 06/23/2017   Procedure: TRANSESOPHAGEAL ECHOCARDIOGRAM (TEE);  Surgeon: Pricilla Riffle, MD;  Location: [facility] ENDOSCOPY;  Service: Cardiovascular;  Laterality: N/A;   TEE WITHOUT CARDIOVERSION N/A 10/17/2018   Procedure: TRANSESOPHAGEAL ECHOCARDIOGRAM (TEE);  Surgeon: Jake Bathe, MD;  Location: [facility] ENDOSCOPY;  Service: Cardiovascular;  Laterality: N/A;   TEE WITHOUT CARDIOVERSION N/A 12/13/2018   Procedure: TRANSESOPHAGEAL ECHOCARDIOGRAM (TEE);  Surgeon: Purcell Nails, MD;  Location: [facility] OR;  Service: Open Heart Surgery;  Laterality: N/A;   TONSILLECTOMY AND ADENOIDECTOMY  1990s   UVULOPALATOPHARYNGOPLASTY, TONSILLECTOMY AND SEPTOPLASTY  1990s     Home Medications:  Prior to Admission medications   Medication Sig Start Date End Date Taking? Authorizing Provider  ALPRAZolam (XANAX) 0.25 MG tablet Take 0.125-0.25 mg by mouth See admin instructions. Take 0.25 mg at night, may take 0.125 mg dose as needed for anxiety   Yes [provider]  Calcium Citrate-Vitamin D (CALCIUM + D PO) Take 1 tablet by mouth daily.   Yes [provider]  carvedilol (COREG) 3.125 MG  tablet TAKE 1 TABLET BY MOUTH TWICE  DAILY WITH MEALS 03/14/23  Yes Fenton, Clint R, PA  cetirizine (ZYRTEC) 10 MG tablet Take 10 mg by mouth daily.   Yes [provider]  diltiazem (CARDIZEM CD) 120 MG 24 hr capsule TAKE 1 CAPSULE (120 MG TOTAL) BY MOUTH AT BEDTIME. 02/04/23  Yes Fenton, Clint R, PA  diltiazem (CARDIZEM) 30 MG tablet Take 1 tablet (30 mg total) by mouth 4 (four) times daily as needed (for heart rates greater than 100). 10/06/21  Yes Joanthony Hamza Daphine Deutscher, MD  dofetilide (TIKOSYN) 500 MCG capsule Take 1 capsule (500 mcg total) by mouth 2 (two) times daily. 07/21/22  Yes Chilton Si, MD   dorzolamide-timolol (COSOPT) 22.3-6.8 MG/ML ophthalmic solution Place 1 drop into both eyes 2 (two) times daily.   Yes [provider]  ELIQUIS 5 MG TABS tablet TAKE 1 TABLET BY MOUTH TWICE  DAILY 09/21/22  Yes Alfredo Spong Daphine Deutscher, MD  fluticasone (FLONASE) 50 MCG/ACT nasal spray Place 1 spray into both nostrils daily.   Yes [provider]  furosemide (LASIX) 20 MG tablet Take 1 tablet (20 mg total) by mouth daily as needed for fluid. For >3lb/overnight or >5lb/weekly note dose 03/22/23  Yes Sheilah Pigeon, PA-C  gabapentin (NEURONTIN) 300 MG capsule Take 300 mg by mouth 3 (three) times daily.   Yes [provider]  hydrALAZINE (APRESOLINE) 25 MG tablet Take 1 tablet (25 mg total) by mouth in the morning and at bedtime. 07/13/22  Yes Alver Sorrow, NP  latanoprost (XALATAN) 0.005 % ophthalmic solution Place 1 drop into both eyes at bedtime.  06/17/14  Yes [provider]  Lidocaine-Glycerin (PREPARATION H EX) Apply 1 Application topically daily as needed (hemorrhoids).   Yes [provider]  magnesium oxide (MAG-OX) 400 (240 Mg) MG tablet Take 1 tablet (400 mg total) by mouth every other day. 04/16/22  Yes Fenton, Clint R, PA  meclizine (ANTIVERT) 25 MG tablet Take 25 mg by mouth as needed for dizziness or nausea. 01/05/21  Yes [provider]  Multiple Vitamin (MULTIVITAMIN WITH MINERALS) TABS tablet Take 1 tablet by mouth daily.   Yes [provider]  NON FORMULARY Apply 1 application topically 2 (two) times daily as needed (for eczema). Traimcinolone/[facility] Moist Cream   Yes [provider]  oxybutynin (DITROPAN-XL) 10 MG 24 hr tablet Take 10 mg by mouth daily. Patient taking differently: Take 10 mg by mouth daily as needed (symptoms). 12/16/15  Yes [provider]  pantoprazole (PROTONIX) 40 MG tablet Take 40 mg by mouth daily. Patient taking differently: Take 40 mg by mouth 2 (two) times daily.   Yes [provider]  polyethylene glycol (MIRALAX / GLYCOLAX) 17 g packet Take 17 g by mouth every other day.   Yes [provider]  potassium chloride (KLOR-CON) 10 MEQ tablet Take 1 tablet (10 mEq total) by mouth daily as needed (when you take Lasix). 03/22/23  Yes Sheilah Pigeon, PA-C  PRESCRIPTION MEDICATION Inhale into the lungs at bedtime. CPAP   Yes [provider]  Probiotic Product (PROBIOTIC PO) Take 1 capsule by mouth daily.   Yes [provider]  sodium chloride (OCEAN) 0.65 % SOLN nasal spray Place 1 spray into both nostrils at bedtime.   Yes [provider]  valsartan (DIOVAN) 160 MG tablet TAKE 1 TABLET BY MOUTH TWICE  DAILY 11/26/22  Yes Alver Sorrow, NP  YUVAFEM 10 MCG TABS vaginal tablet Place 1  each vaginally daily.   Yes [provider]  acetaminophen (TYLENOL) 500 MG tablet Take 500 mg by mouth daily as needed for moderate pain.    [provider]  atorvastatin (LIPITOR) 10 MG tablet Take 10 mg by mouth 2 (two) times a week. 10/01/21   [provider]  Cholecalciferol (VITAMIN D) 50 MCG (2000 UT) tablet Take 2,000 Units by mouth daily. Patient taking differently: Take 2,000 Units by mouth 2 (two) times a week.    [provider]  cyanocobalamin (VITAMIN B12) 1000 MCG tablet Take 1,000 mcg by mouth daily. Patient taking differently: Take 1,000 mcg by mouth 2 (two) times a week.    [provider]    Inpatient Medications: Scheduled Meds:  ALPRAZolam  0.25 mg Oral QHS   apixaban  5 mg Oral BID   calcium-vitamin D  1 tablet Oral Daily   [START ON 04/25/2023] cyanocobalamin  1,000 mcg Oral Once per day on Monday Thursday   diltiazem  120 mg Oral QHS   dofetilide  500 mcg Oral BID   dorzolamide-timolol  1 drop Both Eyes BID   Estradiol  1 each Vaginal Daily   gabapentin  300 mg Oral TID   hydrALAZINE  25 mg Oral Q12H   irbesartan  150 mg Oral Daily   latanoprost  1 drop Both Eyes QHS    loratadine  10 mg Oral Daily   magnesium oxide  400 mg Oral QODAY   metoprolol tartrate  25 mg Oral Q6H   multivitamin with minerals  1 tablet Oral Daily   pantoprazole  40 mg Oral BID   polyethylene glycol  17 g Oral QODAY   Continuous Infusions:  PRN Meds: acetaminophen  Allergies:    Allergies  Allergen Reactions   Tizanidine Nausea Only   Amoxicillin Diarrhea    Has patient had a PCN reaction causing immediate rash, facial/tongue/throat swelling, SOB or lightheadedness with hypotension: no Has patient had a PCN reaction causing severe rash involving mucus membranes or skin necrosis: no Has patient had a PCN reaction that required hospitalization: no Has patient had a PCN reaction occurring within the last 10 years: no If all of the above answers are "NO", then may proceed with Cephalosporin use.    Metoprolol Tartrate Other (See Comments)    Mouth sores "mouth tastes like mold"   Oxaprozin Nausea Only    Social History:   Social History   Socioeconomic History   Marital status: Married    Spouse name: Not on file   Number of children: 3   Years of education: Not on file   Highest education level: 12th grade  Occupational History   Occupation: Retired  Tobacco Use   Smoking status: Never   Smokeless tobacco: Never   Tobacco comments:    Never smoke 09/01/21  Vaping Use   Vaping status: Never Used  Substance and Sexual Activity   Alcohol use: Yes    Comment: rarely   Drug use: No   Sexual activity: Not Currently  Other Topics Concern   Not on file  Social History Narrative   Lives with husband in [place].   Social Drivers of Corporate investment banker Strain: Not on file  Food Insecurity: No Food Insecurity (04/22/2023)   Hunger Vital Sign    Worried About Running Out of Food in the Last Year: Never true    Ran Out of Food in the Last Year: Never true  Transportation Needs: No Transportation Needs (  04/22/2023)   PRAPARE - Scientist, research (physical sciences) (Medical): No    Lack of Transportation (Non-Medical): No  Physical Activity: Not on file  Stress: Not on file  Social Connections: Moderately Isolated (04/22/2023)   Social Connection and Isolation Panel [NHANES]    Frequency of Communication with Friends and Family: More than three times a week    Frequency of Social Gatherings with Friends and Family: Never    Attends Religious Services: Never    Database administrator or Organizations: No    Attends Banker Meetings: Never    Marital Status: Married  Catering manager Violence: Not At Risk (04/22/2023)   Humiliation, Afraid, Rape, and Kick questionnaire    Fear of Current or Ex-Partner: No    Emotionally Abused: No    Physically Abused: No    Sexually Abused: No    Family History:   Family History  Problem Relation Age of Onset   Heart attack Mother    Hypertension Brother    Kidney disease Brother    Diabetes Brother    Heart failure Maternal Grandfather      ROS:  Please see the history of present illness.  All other ROS reviewed and negative.     Physical Exam/Data:   Vitals:   04/22/23 0100 04/22/23 0645 04/22/23 0727 04/22/23 0800  BP: (!) 156/96 (!) 128/56  (!) 148/84  Pulse: (!) 115 60  (!) 47  Resp: 14 14  13   Temp:   97.8 F (36.6 C)   TempSrc:   Oral   SpO2: 98% 100%  99%  Weight:      Height:       No intake or output data in the 24 hours ending 04/22/23 1018    04/21/2023    8:19 PM 03/22/2023    2:26 PM 01/17/2023    2:23 PM  Last 3 Weights  Weight (lbs) 160 lb 160 lb 159 lb 4.8 oz  Weight (kg) 72.576 kg 72.576 kg 72.258 kg     Body mass index is 29.26 kg/m.  General:  Well nourished, well developed, in no acute distress HEENT: normal Neck: no JVD Vascular: No carotid bruits; Distal pulses 2+ bilaterally Cardiac:  irreg; no murmurs, gallops or rubs Lungs:  CTA b/l, no wheezing, rhonchi or rales  Abd: soft, nontender Ext: no edema Musculoskeletal:  No  deformities Skin: warm and dry  Neuro:  no focal abnormalities noted Psych:  Normal affect   EKG:  The EKG was personally reviewed and demonstrates:    #1 ATach 123bpm #2 SR 72bpm, QTc #3 Atach vs ST 115bpm, PVC  Telemetry:  Telemetry was personally reviewed and demonstrates:    Looks like she remains in an Atach with intermittent block, overall HRs 80's-90's  Relevant CV Studies:  Aug 2024 monitor Patient had a min HR of 37 bpm, max HR of 131 bpm, and avg HR of 59 bpm.  Predominant underlying rhythm was Sinus Rhythm.  Atrial Flutter/SVT occurred (13% burden), ranging from 39-131 bpm (avg of 81 bpm), the longest 1 day 20 hours with an avg rate of 81 bpm.  Triggered episodes associated with atrial flutter/SVT Less than 1% ventricular and supraventricular ectopy     12/07/21: TTE 1. Left ventricular ejection fraction, by estimation, is 55 to 60%. Left  ventricular ejection fraction by 3D volume is 55 %. The left ventricle has  normal function. The left ventricle has no regional wall motion  abnormalities.  Left ventricular diastolic   function could not be evaluated. Elevated left ventricular end-diastolic  pressure. The average left ventricular global longitudinal strain is -16.6  %. The global longitudinal strain is normal.   2. Right ventricular systolic function is normal. The right ventricular  size is normal. There is normal pulmonary artery systolic pressure. The  estimated right ventricular systolic pressure is 25.8 mmHg.   3. Left atrial size was mildly dilated.   4. The mitral valve has been repaired/replaced. No evidence of mitral  valve regurgitation. No evidence of mitral stenosis. The mean mitral valve  gradient is 3.0 mmHg at a HR of 52bpm. Echo findings are consistent with  normal structure and function of  the mitral valve prosthesis.   5. The aortic valve is tricuspid. There is moderate calcification of the  aortic valve. There is moderate thickening of  the aortic valve. Aortic  valve regurgitation is mild. Aortic valve sclerosis/calcification is  present, without any evidence of aortic  stenosis. Aortic regurgitation PHT measures 448 msec.   6. Pulmonic valve regurgitation is moderate.   7. The inferior vena cava is normal in size with greater than 50%  respiratory variability, suggesting right atrial pressure of 3 mmHg.     Laboratory Data:  High Sensitivity Troponin:  No results for input(s): "TROPONINIHS" in the last 720 hours.   Chemistry Recent Labs  Lab 04/21/23 2037  NA 140  K 4.1  CL 108  CO2 21*  GLUCOSE 113*  BUN 14  CREATININE 0.99  CALCIUM 9.4  MG 2.3  GFRNONAA 58*  ANIONGAP 11    No results for input(s): "PROT", "ALBUMIN", "AST", "ALT", "ALKPHOS", "BILITOT" in the last 168 hours. Lipids No results for input(s): "CHOL", "TRIG", "HDL", "LABVLDL", "LDLCALC", "CHOLHDL" in the last 168 hours.  Hematology Recent Labs  Lab 04/21/23 2037  WBC 9.8  RBC 4.62  HGB 13.9  HCT 43.3  MCV 93.7  MCH 30.1  MCHC 32.1  RDW 14.6  PLT 237   Thyroid No results for input(s): "TSH", "FREET4" in the last 168 hours.  BNPNo results for input(s): "BNP", "PROBNP" in the last 168 hours.  DDimer No results for input(s): "DDIMER" in the last 168 hours.   Radiology/Studies:  DG Chest 2 View Result Date: 04/21/2023 CLINICAL DATA:  chest pain, shob EXAM: CHEST - 2 VIEW COMPARISON:  Chest x-ray 10/07/2022 CT cardiac 02/16/2022 FINDINGS: The heart and mediastinal contours are within normal limits. Atrial appendage clip and replaced mitral valve. Atherosclerotic plaque. Lingular atelectasis. No focal consolidation. No pulmonary edema. No pleural effusion. No pneumothorax. No acute osseous abnormality. IMPRESSION: 1. No active cardiopulmonary disease. 2.  Aortic Atherosclerosis (ICD10-I70.0). Electronically Signed   By: Tish Frederickson M.D.   On: 04/21/2023 21:39     Assessment and Plan:   1. Persistent AFib 2. SVT 3.  AFlutter CHA2DS2Vasc is 4, on Eliquis, appropriately dosed     On Tikosyn, w/stable QTc   ATach changed her coreg to metop  25mg  Q 6 hours  Follow labs daily Creat a little higher then her baseline >> stay at for now Daily EKGs   4. MVR (bioprosthetic) Stable function 2023  5. Chronic pericardial effusion (trivial by last echo 2022, none described 2023)   6. HTN     Home meds  8. Diastolic dysfunction Volume stable   Risk Assessment/Risk Scores:   For questions or updates, please contact [facility] Please consult [email or web address] for contact info under    Signed, Luster Landsberg  Norberto Sorenson, PA-C  04/22/2023 10:18 AM  I have seen and examined this patient with Francis Dowse.  Agree with above, note added to reflect my findings.  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  She presented to the hospital with symptoms of fatigue and shortness of breath.  She noticed this on the day of discharge when she was exerting herself.  She felt well prior to that and was without complaint.  There are no exacerbating or alleviating factors.  On presentation of the emergency room, she was found to be in SVT.  She was cardioverted to sinus rhythm but quickly had a recurrence of her SVT.  She has been compliant with her dofetilide.  GEN: Well nourished, well developed, in no acute distress  HEENT: normal  Neck: no JVD, carotid bruits, or masses Cardiac: Tachycardic, irregular, no murmurs, rubs, or gallops,no edema  Respiratory:  clear to auscultation bilaterally, normal work of breathing GI: soft, nontender, nondistended, + BS MS: no deformity or atrophy  Skin: warm and dry Neuro:  Strength and sensation are intact Psych: euthymic mood, full affect   SVT: Patient appears to be in an atrial tachycardia.  She has variable AV conduction.  For now, we Teresita Fanton continue her  dofetilide.  QTc remained stable.  Heber Hoog start metoprolol 25 mg every 6.  If this treats her SVT, would be able to be discharged on 50 mg of Toprol-XL twice daily.  Metoprolol can be titrated up based on blood pressure and heart rate.  If SVT was not able to be controlled with metoprolol, she may require pacemaker/AV node ablation versus SVT ablation. Persistent atrial fibrillation/flutter: Has had multiple ablations as well as surgical maze  Hypertension: Continue cCurrently on dofetilide.  Would continue with current management.Hypertension: Continue home medications current medications Chronic diastolic heart failure: No obvious volume overload  Talar Fraley M. Jonnie Truxillo MD 04/22/2023 12:05 PM

## 2023-04-22 NOTE — H&P (Signed)
 Cardiology Admission History and Physical:   Patient ID: Mary Farrell MRN: [id number]; DOB: September 14, 1945   Admission date: 04/21/2023  Primary Care Provider: Daisy Floro, MD [facility] Cardiologist: Mary Si, MD  [facility] Electrophysiologist:  Mary Jorja Loa, MD   Chief Complaint:  palpitations  Patient Profile:   Mary Farrell is a 78 y.o. female with valvular atrial fibrillation status post maze, atrial flutter, SVT, hypertension, bioprosthetic mitral valve replacement and 11/2018, GERD.  History of Present Illness:   [name] reports that around 630 this evening, she developed significant palpitations with sensation of pounding in her back.  She took her heart rate, which was found to be in the 120s.  Had associated fatigue, lightheadedness, and dizziness.  She took 30 mg of her as needed diltiazem, along with evening medications including Tikosyn 500 mg, carvedilol 3.125 mg, apixaban 5 mg grams, amongst others..  She states she is been doing multiple activities in the past few days including traveling.  She called the cardiology service, who recommended she come to the emergency room for cardioversion as she has not missed any doses of her Eliquis.  She had no associated shortness of breath or chest pain.  In the ED she was found to be tachycardic to approximately 110 bpm, and either atrial tachycardia or atrial flutter.  She was sedated, and synchronized cardioversion was performed with 150 J.  This was initially successful, with return to normal sinus rhythm in the 70s.  However after short period of time, she was returned to her presenting SVT with heart rate 150 bpm.  She desires to sleep in her own bed tonight, but understands may not be safe for her to go home with very rapid heart rate   Past Medical History:  Diagnosis Date   Anxiety 06/29/2012   Aortic regurgitation 08/13/2015   Arthritis    "hands, wrists, back, feet, toes" (06/24/2017)   Atrial  flutter (HCC) 09/18/2015   Atypical chest pain 05/13/2016   Chest pain    remote cath in 1996 with NORMAL coronaries noted   CHF (congestive heart failure) (HCC)    Dyspnea 10/30/2010   Essential hypertension 09/05/2013   Exogenous obesity    history of    GERD (gastroesophageal reflux disease)    Glaucoma, both eyes    Headache, migraine    "stopped in my 50's" (09/18/2015)   History of cardiovascular stress test 2007   showing no ischemia   Hypertension    Mitral stenosis and aortic insufficiency 06/23/2010   Mitral stenosis with insufficiency    Mitral stenosis with regurgitation    Murmur    of mitral stenosis and moderate aortic insufficiency       Nausea 05/01/2018   Obesity (BMI 30.0-34.9) 02/13/2021   OSA on CPAP    Palpitations    occasional   Paroxysmal A-fib (HCC) 06/24/2017   Pericardial effusion 09/18/2015   Persistent atrial fibrillation (HCC) 03/10/2017   Personal history of rheumatic heart disease    S/P minimally-invasive maze operation for atrial fibrillation 12/13/2018   Complete bilateral atrial lesion set using cryothermy and bipolar radiofrequency ablation with clipping of LA appendage via right mini-thoracotomy approach   S/P minimally-invasive mitral valve replacement with bioprosthetic valve 12/13/2018   31 mm [facility] Mitral stented bovine pericardial tissue valve   SBE (subacute bacterial endocarditis)    prophylaxis, patient unaware   Sliding hiatal hernia    Stress 02/13/2021   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  Vertigo 05/01/2018    Past Surgical History:  Procedure Laterality Date   ABDOMINAL HYSTERECTOMY  1975   APPENDECTOMY  1977   ATRIAL FIBRILLATION ABLATION N/A 03/10/2017   Procedure: ATRIAL FIBRILLATION ABLATION;  Surgeon: Regan Lemming, MD;  Location: [facility] INVASIVE CV LAB;  Service: Cardiovascular;  Laterality: N/A;   ATRIAL FIBRILLATION ABLATION  06/24/2017   ATRIAL FIBRILLATION ABLATION N/A 06/24/2017   Procedure:  ATRIAL FIBRILLATION ABLATION;  Surgeon: Regan Lemming, MD;  Location: [facility] INVASIVE CV LAB;  Service: Cardiovascular;  Laterality: N/A;   ATRIAL FIBRILLATION ABLATION N/A 02/23/2022   Procedure: ATRIAL FIBRILLATION ABLATION;  Surgeon: Regan Lemming, MD;  Location: [facility] INVASIVE CV LAB;  Service: Cardiovascular;  Laterality: N/A;   BUNIONECTOMY Right 2000s   CARDIAC CATHETERIZATION  01/13/1995   normal coronary anatomy and mild mitral stenosis and mild pulmonary hypertension   CARDIOVERSION N/A 09/22/2015   Procedure: CARDIOVERSION;  Surgeon: Jake Bathe, MD;  Location: [facility] ENDOSCOPY;  Service: Cardiovascular;  Laterality: N/A;   CARDIOVERSION N/A 10/25/2016   Procedure: CARDIOVERSION;  Surgeon: Pricilla Riffle, MD;  Location: [facility] ENDOSCOPY;  Service: Cardiovascular;  Laterality: N/A;   CARDIOVERSION N/A 04/15/2017   Procedure: CARDIOVERSION;  Surgeon: Wendall Stade, MD;  Location: [facility] ENDOSCOPY;  Service: Cardiovascular;  Laterality: N/A;   CARDIOVERSION N/A 05/16/2017   Procedure: CARDIOVERSION;  Surgeon: Chrystie Nose, MD;  Location: [facility] ENDOSCOPY;  Service: Cardiovascular;  Laterality: N/A;   CARDIOVERSION N/A 01/24/2019   Procedure: CARDIOVERSION;  Surgeon: Chrystie Nose, MD;  Location: [facility] ENDOSCOPY;  Service: Cardiovascular;  Laterality: N/A;   CARDIOVERSION N/A 10/14/2021   Procedure: CARDIOVERSION;  Surgeon: Little Ishikawa, MD;  Location: [facility] ENDOSCOPY;  Service: Cardiovascular;  Laterality: N/A;   CARDIOVERSION N/A 11/20/2021   Procedure: CARDIOVERSION;  Surgeon: Pricilla Riffle, MD;  Location: [facility] ENDOSCOPY;  Service: Cardiovascular;  Laterality: N/A;   CATARACT EXTRACTION W/ INTRAOCULAR LENS  IMPLANT, BILATERAL Bilateral 2013   COLONOSCOPY  2013   EYE SURGERY Bilateral    cataracts   LAPAROSCOPIC CHOLECYSTECTOMY  1997   MINIMALLY INVASIVE MAZE PROCEDURE N/A 12/13/2018   Procedure: MINIMALLY INVASIVE MAZE PROCEDURE using a 45 MM AtriClip.;  Surgeon: Purcell Nails,  MD;  Location: [facility] OR;  Service: Open Heart Surgery;  Laterality: N/A;   MITRAL VALVE REPLACEMENT Right 12/13/2018   Procedure: MINIMALLY INVASIVE MITRAL VALVE (MV) REPLACEMENT using a Magna Mitral Ease 31 MM Valve.;  Surgeon: Purcell Nails, MD;  Location: [facility] OR;  Service: Open Heart Surgery;  Laterality: Right;   RIGHT/LEFT HEART CATH AND CORONARY ANGIOGRAPHY N/A 10/18/2018   Procedure: RIGHT/LEFT HEART CATH AND CORONARY ANGIOGRAPHY;  Surgeon: Marykay Lex, MD;  Location: [facility] INVASIVE CV LAB;  Service: Cardiovascular;  Laterality: N/A;   TEE WITHOUT CARDIOVERSION N/A 06/23/2017   Procedure: TRANSESOPHAGEAL ECHOCARDIOGRAM (TEE);  Surgeon: Pricilla Riffle, MD;  Location: [facility] ENDOSCOPY;  Service: Cardiovascular;  Laterality: N/A;   TEE WITHOUT CARDIOVERSION N/A 10/17/2018   Procedure: TRANSESOPHAGEAL ECHOCARDIOGRAM (TEE);  Surgeon: Jake Bathe, MD;  Location: [facility] ENDOSCOPY;  Service: Cardiovascular;  Laterality: N/A;   TEE WITHOUT CARDIOVERSION N/A 12/13/2018   Procedure: TRANSESOPHAGEAL ECHOCARDIOGRAM (TEE);  Surgeon: Purcell Nails, MD;  Location: [facility] OR;  Service: Open Heart Surgery;  Laterality: N/A;   TONSILLECTOMY AND ADENOIDECTOMY  1990s   UVULOPALATOPHARYNGOPLASTY, TONSILLECTOMY AND SEPTOPLASTY  1990s     Medications Prior to Admission: Prior to Admission medications   Medication Sig Start Date End Date Taking? Authorizing Provider  ALPRAZolam (XANAX) 0.25 MG tablet Take 0.125-0.25 mg by mouth See admin instructions. Take 0.25 mg at night, may take 0.125 mg dose as needed for anxiety   Yes [provider]  Calcium Citrate-Vitamin D (CALCIUM + D PO) Take 1 tablet by mouth daily.   Yes [provider]  carvedilol (COREG) 3.125 MG tablet TAKE 1 TABLET BY MOUTH TWICE  DAILY WITH MEALS 03/14/23  Yes Fenton, Clint R, PA  cetirizine (ZYRTEC) 10 MG tablet Take 10 mg by mouth daily.   Yes [provider]  diltiazem (CARDIZEM CD) 120 MG 24 hr capsule TAKE 1 CAPSULE (120  MG TOTAL) BY MOUTH AT BEDTIME. 02/04/23  Yes Fenton, Clint R, PA  diltiazem (CARDIZEM) 30 MG tablet Take 1 tablet (30 mg total) by mouth 4 (four) times daily as needed (for heart rates greater than 100). 10/06/21  Yes Camnitz, Mary Daphine Deutscher, MD  dofetilide (TIKOSYN) 500 MCG capsule Take 1 capsule (500 mcg total) by mouth 2 (two) times daily. 07/21/22  Yes Mary Si, MD  dorzolamide-timolol (COSOPT) 22.3-6.8 MG/ML ophthalmic solution Place 1 drop into both eyes 2 (two) times daily.   Yes [provider]  ELIQUIS 5 MG TABS tablet TAKE 1 TABLET BY MOUTH TWICE  DAILY 09/21/22  Yes Camnitz, Mary Daphine Deutscher, MD  fluticasone (FLONASE) 50 MCG/ACT nasal spray Place 1 spray into both nostrils daily.   Yes [provider]  furosemide (LASIX) 20 MG tablet Take 1 tablet (20 mg total) by mouth daily as needed for fluid. For >3lb/overnight or >5lb/weekly note dose 03/22/23  Yes Sheilah Pigeon, PA-C  gabapentin (NEURONTIN) 300 MG capsule Take 300 mg by mouth 3 (three) times daily.   Yes [provider]  hydrALAZINE (APRESOLINE) 25 MG tablet Take 1 tablet (25 mg total) by mouth in the morning and at bedtime. 07/13/22  Yes Alver Sorrow, NP  latanoprost (XALATAN) 0.005 % ophthalmic solution Place 1 drop into both eyes at bedtime.  06/17/14  Yes [provider]  Lidocaine-Glycerin (PREPARATION H EX) Apply 1 Application topically daily as needed (hemorrhoids).   Yes [provider]  magnesium oxide (MAG-OX) 400 (240 Mg) MG tablet Take 1 tablet (400 mg total) by mouth every other day. 04/16/22  Yes Fenton, Clint R, PA  meclizine (ANTIVERT) 25 MG tablet Take 25 mg by mouth as needed for dizziness or nausea. 01/05/21  Yes [provider]  Multiple Vitamin (MULTIVITAMIN WITH MINERALS) TABS tablet Take 1 tablet by mouth daily.   Yes [provider]  NON FORMULARY Apply 1 application topically 2 (two) times daily as needed (for eczema). Traimcinolone/[facility] Moist  Cream   Yes [provider]  oxybutynin (DITROPAN-XL) 10 MG 24 hr tablet Take 10 mg by mouth daily. Patient taking differently: Take 10 mg by mouth daily as needed (symptoms). 12/16/15  Yes [provider]  pantoprazole (PROTONIX) 40 MG tablet Take 40 mg by mouth daily. Patient taking differently: Take 40 mg by mouth 2 (two) times daily.   Yes [provider]  polyethylene glycol (MIRALAX / GLYCOLAX) 17 g packet Take 17 g by mouth every other day.   Yes [provider]  potassium chloride (KLOR-CON) 10 MEQ tablet Take 1 tablet (10 mEq total) by mouth daily as needed (when you take Lasix). 03/22/23  Yes Sheilah Pigeon, PA-C  PRESCRIPTION MEDICATION Inhale into the lungs at bedtime. CPAP   Yes [provider]  Probiotic Product (PROBIOTIC PO) Take 1 capsule by mouth daily.  Yes [provider]  sodium chloride (OCEAN) 0.65 % SOLN nasal spray Place 1 spray into both nostrils at bedtime.   Yes [provider]  valsartan (DIOVAN) 160 MG tablet TAKE 1 TABLET BY MOUTH TWICE  DAILY 11/26/22  Yes Alver Sorrow, NP  YUVAFEM 10 MCG TABS vaginal tablet Place 1 each vaginally daily.   Yes [provider]  acetaminophen (TYLENOL) 500 MG tablet Take 500 mg by mouth daily as needed for moderate pain.    [provider]  atorvastatin (LIPITOR) 10 MG tablet Take 10 mg by mouth 2 (two) times a week. 10/01/21   [provider]  Cholecalciferol (VITAMIN D) 50 MCG (2000 UT) tablet Take 2,000 Units by mouth daily. Patient taking differently: Take 2,000 Units by mouth 2 (two) times a week.    [provider]  cyanocobalamin (VITAMIN B12) 1000 MCG tablet Take 1,000 mcg by mouth daily. Patient taking differently: Take 1,000 mcg by mouth 2 (two) times a week.    [provider]     Allergies:    Allergies  Allergen Reactions   Tizanidine Nausea Only   Amoxicillin Diarrhea    Has patient had a PCN reaction  causing immediate rash, facial/tongue/throat swelling, SOB or lightheadedness with hypotension: no Has patient had a PCN reaction causing severe rash involving mucus membranes or skin necrosis: no Has patient had a PCN reaction that required hospitalization: no Has patient had a PCN reaction occurring within the last 10 years: no If all of the above answers are "NO", then may proceed with Cephalosporin use.    Metoprolol Tartrate Other (See Comments)    Mouth sores "mouth tastes like mold"   Oxaprozin Nausea Only    Social History:   Social History   Socioeconomic History   Marital status: Married    Spouse name: Not on file   Number of children: Not on file   Years of education: Not on file   Highest education level: Not on file  Occupational History   Occupation: Retired  Tobacco Use   Smoking status: Never   Smokeless tobacco: Never   Tobacco comments:    Never smoke 09/01/21  Vaping Use   Vaping status: Never Used  Substance and Sexual Activity   Alcohol use: Yes    Comment: rarely   Drug use: No   Sexual activity: Not Currently  Other Topics Concern   Not on file  Social History Narrative   Lives with husband in [place].   Social Drivers of Corporate investment banker Strain: Not on file  Food Insecurity: Not on file  Transportation Needs: Not on file  Physical Activity: Not on file  Stress: Not on file  Social Connections: Not on file  Intimate Partner Violence: Not on file    Family History:   The patient's family history includes Diabetes in her brother; Heart attack in her mother; Heart failure in her maternal grandfather; Hypertension in her brother; Kidney disease in her brother.    ROS:   Review of Systems: [y] = yes, [ ]  = no      General: Weight gain [ ] ; Weight loss [ ] ; Anorexia [ ] ; Fatigue [ ] ; Fever [ ] ; Chills [ ] ; Weakness [ ]    Cardiac: Chest pain/pressure [ ] ; Resting SOB [ ] ; Exertional SOB [ ] ; Orthopnea [ ] ; Pedal Edema [ ] ;  Palpitations [ y]; Syncope [ ] ; Presyncope [ ] ; Paroxysmal nocturnal dyspnea [ ]    Pulmonary:  Cough [ ] ; Wheezing [ ] ; Hemoptysis [ ] ; Sputum [ ] ; Snoring [ ]    GI: Vomiting [ ] ; Dysphagia [ ] ; Melena [ ] ; Hematochezia [ ] ; Heartburn [ ] ; Abdominal pain [ ] ; Constipation [ ] ; Diarrhea [ ] ; BRBPR [ ]    GU: Hematuria [ ] ; Dysuria [ ] ; Nocturia [ ]  Vascular: Pain in legs with walking [ ] ; Pain in feet with lying flat [ ] ; Non-healing sores [ ] ; Stroke [ ] ; TIA [ ] ; Slurred speech [ ] ;   Neuro: Headaches [ ] ; Vertigo [ ] ; Seizures [ ] ; Paresthesias [ ] ;Blurred vision [ ] ; Diplopia [ ] ; Vision changes [ ]  ; dizziness [y] Ortho/Skin: Arthritis [ ] ; Joint pain [ ] ; Muscle pain [ ] ; Joint swelling [ ] ; Back Pain [ ] ; Rash [ ]    Psych: Depression [ ] ; Anxiety [ ]    Heme: Bleeding problems [ ] ; Clotting disorders [ ] ; Anemia [ ]    Endocrine: Diabetes [ ] ; Thyroid dysfunction [ ]    Physical Exam/Data:   Vitals:   04/21/23 2146 04/21/23 2157 04/21/23 2200 04/21/23 2225  BP: (!) 151/87 (!) 149/62 (!) 138/55 (!) 142/73  Pulse: (!) 114 72 71 (!) 116  Resp: 16 14 15 14   Temp: 97.9 F (36.6 C)     SpO2: 98% 95% 92% 98%  Weight:      Height:       No intake or output data in the 24 hours ending 04/22/23 0022    04/21/2023    8:19 PM 03/22/2023    2:26 PM 01/17/2023    2:23 PM  Last 3 Weights  Weight (lbs) 160 lb 160 lb 159 lb 4.8 oz  Weight (kg) 72.576 kg 72.576 kg 72.258 kg     Body mass index is 29.26 kg/m.  General:  Well nourished, well developed, in no acute distress HEENT: normal Lymph: no adenopathy Neck: no JVD Endocrine:  No thryomegaly Vascular: No carotid bruits; FA pulses 2+ bilaterally without bruits  Cardiac:  normal S1, S2; tachycardic; no murmur Lungs:  clear to auscultation bilaterally, no wheezing, rhonchi or rales  Abd: soft, nontender, no hepatomegaly  Ext: no edema Musculoskeletal:  No deformities, BUE and BLE strength normal and equal Skin: warm and dry  Neuro:  CNs  2-12 intact, no focal abnormalities noted Psych:  Normal affect   EKG on 04/21/2023 at 2023 PM: was personally reviewed and revealed atrial tachycardia with ventricular rate 123 bpm, less likely atrial flutter EKG on 04/21/2023 at 20 1:55 PM: Personally reviewed and reveals a sinus rhythm with ventricular rate 72 bpm and nonspecific ST changes EKG on 04/21/2023 at 22: 40: Personally reviewed and reveals atrial tachycardia, less likely atrial flutter with ventricular rate 115 bpm  Relevant CV Studies:  Echo (12/07/2021): IMPRESSIONS   1. Left ventricular ejection fraction, by estimation, is 55 to 60%. Left  ventricular ejection fraction by 3D volume is 55 %. The left ventricle has  normal function. The left ventricle has no regional wall motion  abnormalities. Left ventricular diastolic   function could not be evaluated. Elevated left ventricular end-diastolic  pressure. The average left ventricular global longitudinal strain is -16.6  %. The global longitudinal strain is normal.   2. Right ventricular systolic function is normal. The right ventricular  size is normal. There is normal pulmonary artery systolic pressure. The  estimated right ventricular systolic pressure is 25.8 mmHg.   3. Left atrial size was mildly dilated.   4. The mitral valve  has been repaired/replaced. No evidence of mitral  valve regurgitation. No evidence of mitral stenosis. The mean mitral valve  gradient is 3.0 mmHg at a HR of 52bpm. Echo findings are consistent with  normal structure and function of  the mitral valve prosthesis.   5. The aortic valve is tricuspid. There is moderate calcification of the  aortic valve. There is moderate thickening of the aortic valve. Aortic  valve regurgitation is mild. Aortic valve sclerosis/calcification is  present, without any evidence of aortic  stenosis. Aortic regurgitation PHT measures 448 msec.   6. Pulmonic valve regurgitation is moderate.   7. The inferior vena cava  is normal in size with greater than 50%  respiratory variability, suggesting right atrial pressure of 3 mmHg.   Laboratory Data:  High Sensitivity Troponin:  No results for input(s): "TROPONINIHS" in the last 720 hours.    Chemistry Recent Labs  Lab 04/21/23 2037  NA 140  K 4.1  CL 108  CO2 21*  GLUCOSE 113*  BUN 14  CREATININE 0.99  CALCIUM 9.4  GFRNONAA 58*  ANIONGAP 11    No results for input(s): "PROT", "ALBUMIN", "AST", "ALT", "ALKPHOS", "BILITOT" in the last 168 hours. Hematology Recent Labs  Lab 04/21/23 2037  WBC 9.8  RBC 4.62  HGB 13.9  HCT 43.3  MCV 93.7  MCH 30.1  MCHC 32.1  RDW 14.6  PLT 237   BNPNo results for input(s): "BNP", "PROBNP" in the last 168 hours.  DDimer No results for input(s): "DDIMER" in the last 168 hours.  Radiology/Studies:  DG Chest 2 View Result Date: 04/21/2023 CLINICAL DATA:  chest pain, shob EXAM: CHEST - 2 VIEW COMPARISON:  Chest x-ray 10/07/2022 CT cardiac 02/16/2022 FINDINGS: The heart and mediastinal contours are within normal limits. Atrial appendage clip and replaced mitral valve. Atherosclerotic plaque. Lingular atelectasis. No focal consolidation. No pulmonary edema. No pleural effusion. No pneumothorax. No acute osseous abnormality. IMPRESSION: 1. No active cardiopulmonary disease. 2.  Aortic Atherosclerosis (ICD10-I70.0). Electronically Signed   By: Tish Frederickson M.D.   On: 04/21/2023 21:39    Assessment and Plan:   Atrial tachycardia versus atypical atrial flutter History of persistent atrial fibrillation status post PVI ablation (03/10/2017, 06/24/2017, 02/23/2022) History of atrial flutter status post CTI ablation (06/24/2017) Need for chronic anticoagulation Hx of bioprosthetic MVR Presenting with symptomatic palpitations with ventricular rate in the 120s.  She underwent synchronized cardioversion in the ED, however only had short lasting period of sinus rhythm, after which she returned to her presenting rhythm.  ECG  few reveals likely atrial tachycardia versus atypical atrial flutter.  Her risk factors for these arrhythmias include her prior structural heart disease with mitral valve replacement along with 3 prior ablations with intra atrial scar.  Given the degree of symptoms and tachycardia that was not successfully controlled by cardioversion, she would warrant inpatient admission for evaluation by electrophysiology, and consideration of either redo ablation versus adjustment of her antiarrhythmic therapies -EP consult in a.m. for consideration of either redo ablation versus adjustment of antiarrhythmics -Continue home carvedilol 3.125 twice daily, diltiazem 120 mg nightly, dofetilide 500 mcg twice daily -Continue home Eliquis 5 mg twice daily, has not missed any doses -Overnight Mary continue trialing as needed short acting diltiazem -Telemetry, n.p.o. midnight  Hypertension -Continue valsartan 160 mg (formula alternative for home irbesartan) -Continue home carvedilol as above -Continue hydralazine 25 mg twice daily  Hyperlipidemia -Continue home atorvastatin  GERD -Continue home PPI  Insomnia Takes alprazolam 0.25 mg chronically every  night -Continue home alprazolam nightly  Allergies -Continue home selective H2 blocker, hold home nasal steroid  [place] -Continue home Cosopt  Severity of Illness: The appropriate patient status for this patient is INPATIENT. Inpatient status is judged to be reasonable and necessary in order to provide the required intensity of service to ensure the patient's safety. The patient's presenting symptoms, physical exam findings, and initial radiographic and laboratory data in the context of their chronic comorbidities is felt to place them at high risk for further clinical deterioration. Furthermore, it is not anticipated that the patient Mary be medically stable for discharge from the hospital within 2 midnights of admission.   * I certify that at the point of  admission it is my clinical judgment that the patient Mary require inpatient hospital care spanning beyond 2 midnights from the point of admission due to high intensity of service, high risk for further deterioration and high frequency of surveillance required.*   For questions or updates, please contact [facility] Please consult [email or web address] for contact info under     Signed, Freddy Finner, MD  04/22/2023 12:22 AM

## 2023-04-22 NOTE — H&P (View-Only) (Signed)
 Cardiology Consultation   Patient ID: [name] MRN: [id number]; DOB: [birth year]  Admit date: 04/21/2023 Date of Consult: 04/22/2023  PCP:  Daisy Floro, MD   [facility] Providers Cardiologist:  Chilton Si, MD  Electrophysiologist:  Shonya Sumida Jorja Loa, MD       Patient Profile:   Mary Farrell is a 78 y.o. 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 Hulan Saas.   AFib/AAD Amiodarone not tolerated 2/2 GI  Flecainide 2017 >> failed and had PR prolongation PVI ablation 03/10/2017 CTI/PVI Ablation May 2019 MAZE 2020 Tikosyn Jan 2021 PVI ablation 02/23/22  History of Present Illness:   Mary Farrell was doing well, had been unusually busy yesterday though having a good day, once home, fatigued and went to rest beefore bringing in the groceries.  Developed sudden onset pounding/fast heart beat, not like her AFib, but definitely not normal and came in. No CP, SOB, no syncope Reported good medication compliance She was found in suspect SVT and cardioverted to SR Planned for discharge though had recurrent arrhythmia, cardiology consulted > admitted  LABS K+ 4.1 Mag 2.3 Creat 0.99 (cal creat cl 53 (baseline creat ~0.8) WBC 9.8 H/H 13/43 Plts 237    Past Medical History:  Diagnosis Date   Anxiety 06/29/2012   Aortic regurgitation 08/13/2015   Arthritis    "hands, wrists, back, feet, toes" (06/24/2017)   Atrial flutter (HCC) 09/18/2015   Atypical chest pain 05/13/2016   Chest pain    remote cath in 1996 with NORMAL coronaries noted   CHF (congestive heart failure) (HCC)    Dyspnea 10/30/2010   Essential hypertension 09/05/2013   Exogenous obesity    history of    GERD (gastroesophageal reflux disease)    Glaucoma, both eyes    Headache, migraine    "stopped in my  50's" (09/18/2015)   History of cardiovascular stress test 2007   showing no ischemia   Hypertension    Mitral stenosis and aortic insufficiency 06/23/2010   Mitral stenosis with insufficiency    Mitral stenosis with regurgitation    Murmur    of mitral stenosis and moderate aortic insufficiency       Nausea 05/01/2018   Obesity (BMI 30.0-34.9) 02/13/2021   OSA on CPAP    Palpitations    occasional   Paroxysmal A-fib (HCC) 06/24/2017   Pericardial effusion 09/18/2015   Persistent atrial fibrillation (HCC) 03/10/2017   Personal history of rheumatic heart disease    S/P minimally-invasive maze operation for atrial fibrillation 12/13/2018   Complete bilateral atrial lesion set using cryothermy and bipolar radiofrequency ablation with clipping of LA appendage via right mini-thoracotomy approach   S/P minimally-invasive mitral valve replacement with bioprosthetic valve 12/13/2018   31 mm [facility] Mitral stented bovine pericardial tissue valve   SBE (subacute bacterial endocarditis)    prophylaxis, patient unaware   Sliding hiatal hernia    Stress 02/13/2021   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   Vertigo 05/01/2018    Past Surgical History:  Procedure Laterality Date   ABDOMINAL HYSTERECTOMY  1975   APPENDECTOMY  1977   ATRIAL FIBRILLATION ABLATION N/A 03/10/2017   Procedure: ATRIAL FIBRILLATION ABLATION;  Surgeon: Regan Lemming, MD;  Location: [facility] INVASIVE CV LAB;  Service: Cardiovascular;  Laterality: N/A;   ATRIAL FIBRILLATION  ABLATION  06/24/2017   ATRIAL FIBRILLATION ABLATION N/A 06/24/2017   Procedure: ATRIAL FIBRILLATION ABLATION;  Surgeon: Regan Lemming, MD;  Location: [facility] INVASIVE CV LAB;  Service: Cardiovascular;  Laterality: N/A;   ATRIAL FIBRILLATION ABLATION N/A 02/23/2022   Procedure: ATRIAL FIBRILLATION ABLATION;  Surgeon: Regan Lemming, MD;  Location: [facility] INVASIVE CV LAB;  Service: Cardiovascular;  Laterality: N/A;   BUNIONECTOMY Right 2000s    CARDIAC CATHETERIZATION  01/13/1995   normal coronary anatomy and mild mitral stenosis and mild pulmonary hypertension   CARDIOVERSION N/A 09/22/2015   Procedure: CARDIOVERSION;  Surgeon: Jake Bathe, MD;  Location: [facility] ENDOSCOPY;  Service: Cardiovascular;  Laterality: N/A;   CARDIOVERSION N/A 10/25/2016   Procedure: CARDIOVERSION;  Surgeon: Pricilla Riffle, MD;  Location: [facility] ENDOSCOPY;  Service: Cardiovascular;  Laterality: N/A;   CARDIOVERSION N/A 04/15/2017   Procedure: CARDIOVERSION;  Surgeon: Wendall Stade, MD;  Location: [facility] ENDOSCOPY;  Service: Cardiovascular;  Laterality: N/A;   CARDIOVERSION N/A 05/16/2017   Procedure: CARDIOVERSION;  Surgeon: Chrystie Nose, MD;  Location: [facility] ENDOSCOPY;  Service: Cardiovascular;  Laterality: N/A;   CARDIOVERSION N/A 01/24/2019   Procedure: CARDIOVERSION;  Surgeon: Chrystie Nose, MD;  Location: [facility] ENDOSCOPY;  Service: Cardiovascular;  Laterality: N/A;   CARDIOVERSION N/A 10/14/2021   Procedure: CARDIOVERSION;  Surgeon: Little Ishikawa, MD;  Location: [facility] ENDOSCOPY;  Service: Cardiovascular;  Laterality: N/A;   CARDIOVERSION N/A 11/20/2021   Procedure: CARDIOVERSION;  Surgeon: Pricilla Riffle, MD;  Location: [facility] ENDOSCOPY;  Service: Cardiovascular;  Laterality: N/A;   CATARACT EXTRACTION W/ INTRAOCULAR LENS  IMPLANT, BILATERAL Bilateral 2013   COLONOSCOPY  2013   EYE SURGERY Bilateral    cataracts   LAPAROSCOPIC CHOLECYSTECTOMY  1997   MINIMALLY INVASIVE MAZE PROCEDURE N/A 12/13/2018   Procedure: MINIMALLY INVASIVE MAZE PROCEDURE using a 45 MM AtriClip.;  Surgeon: Purcell Nails, MD;  Location: [facility] OR;  Service: Open Heart Surgery;  Laterality: N/A;   MITRAL VALVE REPLACEMENT Right 12/13/2018   Procedure: MINIMALLY INVASIVE MITRAL VALVE (MV) REPLACEMENT using a Magna Mitral Ease 31 MM Valve.;  Surgeon: Purcell Nails, MD;  Location: [facility] OR;  Service: Open Heart Surgery;  Laterality: Right;   RIGHT/LEFT HEART CATH AND CORONARY ANGIOGRAPHY N/A  10/18/2018   Procedure: RIGHT/LEFT HEART CATH AND CORONARY ANGIOGRAPHY;  Surgeon: Marykay Lex, MD;  Location: [facility] INVASIVE CV LAB;  Service: Cardiovascular;  Laterality: N/A;   TEE WITHOUT CARDIOVERSION N/A 06/23/2017   Procedure: TRANSESOPHAGEAL ECHOCARDIOGRAM (TEE);  Surgeon: Pricilla Riffle, MD;  Location: [facility] ENDOSCOPY;  Service: Cardiovascular;  Laterality: N/A;   TEE WITHOUT CARDIOVERSION N/A 10/17/2018   Procedure: TRANSESOPHAGEAL ECHOCARDIOGRAM (TEE);  Surgeon: Jake Bathe, MD;  Location: [facility] ENDOSCOPY;  Service: Cardiovascular;  Laterality: N/A;   TEE WITHOUT CARDIOVERSION N/A 12/13/2018   Procedure: TRANSESOPHAGEAL ECHOCARDIOGRAM (TEE);  Surgeon: Purcell Nails, MD;  Location: [facility] OR;  Service: Open Heart Surgery;  Laterality: N/A;   TONSILLECTOMY AND ADENOIDECTOMY  1990s   UVULOPALATOPHARYNGOPLASTY, TONSILLECTOMY AND SEPTOPLASTY  1990s     Home Medications:  Prior to Admission medications   Medication Sig Start Date End Date Taking? Authorizing Provider  ALPRAZolam (XANAX) 0.25 MG tablet Take 0.125-0.25 mg by mouth See admin instructions. Take 0.25 mg at night, may take 0.125 mg dose as needed for anxiety   Yes [provider]  Calcium Citrate-Vitamin D (CALCIUM + D PO) Take 1 tablet by mouth daily.   Yes [provider]  carvedilol (COREG) 3.125 MG  tablet TAKE 1 TABLET BY MOUTH TWICE  DAILY WITH MEALS 03/14/23  Yes Fenton, Clint R, PA  cetirizine (ZYRTEC) 10 MG tablet Take 10 mg by mouth daily.   Yes [provider]  diltiazem (CARDIZEM CD) 120 MG 24 hr capsule TAKE 1 CAPSULE (120 MG TOTAL) BY MOUTH AT BEDTIME. 02/04/23  Yes Fenton, Clint R, PA  diltiazem (CARDIZEM) 30 MG tablet Take 1 tablet (30 mg total) by mouth 4 (four) times daily as needed (for heart rates greater than 100). 10/06/21  Yes Joanthony Hamza Daphine Deutscher, MD  dofetilide (TIKOSYN) 500 MCG capsule Take 1 capsule (500 mcg total) by mouth 2 (two) times daily. 07/21/22  Yes Chilton Si, MD   dorzolamide-timolol (COSOPT) 22.3-6.8 MG/ML ophthalmic solution Place 1 drop into both eyes 2 (two) times daily.   Yes [provider]  ELIQUIS 5 MG TABS tablet TAKE 1 TABLET BY MOUTH TWICE  DAILY 09/21/22  Yes Alfredo Spong Daphine Deutscher, MD  fluticasone (FLONASE) 50 MCG/ACT nasal spray Place 1 spray into both nostrils daily.   Yes [provider]  furosemide (LASIX) 20 MG tablet Take 1 tablet (20 mg total) by mouth daily as needed for fluid. For >3lb/overnight or >5lb/weekly note dose 03/22/23  Yes Sheilah Pigeon, PA-C  gabapentin (NEURONTIN) 300 MG capsule Take 300 mg by mouth 3 (three) times daily.   Yes [provider]  hydrALAZINE (APRESOLINE) 25 MG tablet Take 1 tablet (25 mg total) by mouth in the morning and at bedtime. 07/13/22  Yes Alver Sorrow, NP  latanoprost (XALATAN) 0.005 % ophthalmic solution Place 1 drop into both eyes at bedtime.  06/17/14  Yes [provider]  Lidocaine-Glycerin (PREPARATION H EX) Apply 1 Application topically daily as needed (hemorrhoids).   Yes [provider]  magnesium oxide (MAG-OX) 400 (240 Mg) MG tablet Take 1 tablet (400 mg total) by mouth every other day. 04/16/22  Yes Fenton, Clint R, PA  meclizine (ANTIVERT) 25 MG tablet Take 25 mg by mouth as needed for dizziness or nausea. 01/05/21  Yes [provider]  Multiple Vitamin (MULTIVITAMIN WITH MINERALS) TABS tablet Take 1 tablet by mouth daily.   Yes [provider]  NON FORMULARY Apply 1 application topically 2 (two) times daily as needed (for eczema). Traimcinolone/[facility] Moist Cream   Yes [provider]  oxybutynin (DITROPAN-XL) 10 MG 24 hr tablet Take 10 mg by mouth daily. Patient taking differently: Take 10 mg by mouth daily as needed (symptoms). 12/16/15  Yes [provider]  pantoprazole (PROTONIX) 40 MG tablet Take 40 mg by mouth daily. Patient taking differently: Take 40 mg by mouth 2 (two) times daily.   Yes [provider]  polyethylene glycol (MIRALAX / GLYCOLAX) 17 g packet Take 17 g by mouth every other day.   Yes [provider]  potassium chloride (KLOR-CON) 10 MEQ tablet Take 1 tablet (10 mEq total) by mouth daily as needed (when you take Lasix). 03/22/23  Yes Sheilah Pigeon, PA-C  PRESCRIPTION MEDICATION Inhale into the lungs at bedtime. CPAP   Yes [provider]  Probiotic Product (PROBIOTIC PO) Take 1 capsule by mouth daily.   Yes [provider]  sodium chloride (OCEAN) 0.65 % SOLN nasal spray Place 1 spray into both nostrils at bedtime.   Yes [provider]  valsartan (DIOVAN) 160 MG tablet TAKE 1 TABLET BY MOUTH TWICE  DAILY 11/26/22  Yes Alver Sorrow, NP  YUVAFEM 10 MCG TABS vaginal tablet Place 1  each vaginally daily.   Yes [provider]  acetaminophen (TYLENOL) 500 MG tablet Take 500 mg by mouth daily as needed for moderate pain.    [provider]  atorvastatin (LIPITOR) 10 MG tablet Take 10 mg by mouth 2 (two) times a week. 10/01/21   [provider]  Cholecalciferol (VITAMIN D) 50 MCG (2000 UT) tablet Take 2,000 Units by mouth daily. Patient taking differently: Take 2,000 Units by mouth 2 (two) times a week.    [provider]  cyanocobalamin (VITAMIN B12) 1000 MCG tablet Take 1,000 mcg by mouth daily. Patient taking differently: Take 1,000 mcg by mouth 2 (two) times a week.    [provider]    Inpatient Medications: Scheduled Meds:  ALPRAZolam  0.25 mg Oral QHS   apixaban  5 mg Oral BID   calcium-vitamin D  1 tablet Oral Daily   [START ON 04/25/2023] cyanocobalamin  1,000 mcg Oral Once per day on Monday Thursday   diltiazem  120 mg Oral QHS   dofetilide  500 mcg Oral BID   dorzolamide-timolol  1 drop Both Eyes BID   Estradiol  1 each Vaginal Daily   gabapentin  300 mg Oral TID   hydrALAZINE  25 mg Oral Q12H   irbesartan  150 mg Oral Daily   latanoprost  1 drop Both Eyes QHS    loratadine  10 mg Oral Daily   magnesium oxide  400 mg Oral QODAY   metoprolol tartrate  25 mg Oral Q6H   multivitamin with minerals  1 tablet Oral Daily   pantoprazole  40 mg Oral BID   polyethylene glycol  17 g Oral QODAY   Continuous Infusions:  PRN Meds: acetaminophen  Allergies:    Allergies  Allergen Reactions   Tizanidine Nausea Only   Amoxicillin Diarrhea    Has patient had a PCN reaction causing immediate rash, facial/tongue/throat swelling, SOB or lightheadedness with hypotension: no Has patient had a PCN reaction causing severe rash involving mucus membranes or skin necrosis: no Has patient had a PCN reaction that required hospitalization: no Has patient had a PCN reaction occurring within the last 10 years: no If all of the above answers are "NO", then may proceed with Cephalosporin use.    Metoprolol Tartrate Other (See Comments)    Mouth sores "mouth tastes like mold"   Oxaprozin Nausea Only    Social History:   Social History   Socioeconomic History   Marital status: Married    Spouse name: Not on file   Number of children: 3   Years of education: Not on file   Highest education level: 12th grade  Occupational History   Occupation: Retired  Tobacco Use   Smoking status: Never   Smokeless tobacco: Never   Tobacco comments:    Never smoke 09/01/21  Vaping Use   Vaping status: Never Used  Substance and Sexual Activity   Alcohol use: Yes    Comment: rarely   Drug use: No   Sexual activity: Not Currently  Other Topics Concern   Not on file  Social History Narrative   Lives with husband in [place].   Social Drivers of Corporate investment banker Strain: Not on file  Food Insecurity: No Food Insecurity (04/22/2023)   Hunger Vital Sign    Worried About Running Out of Food in the Last Year: Never true    Ran Out of Food in the Last Year: Never true  Transportation Needs: No Transportation Needs (  04/22/2023)   PRAPARE - Scientist, research (physical sciences) (Medical): No    Lack of Transportation (Non-Medical): No  Physical Activity: Not on file  Stress: Not on file  Social Connections: Moderately Isolated (04/22/2023)   Social Connection and Isolation Panel [NHANES]    Frequency of Communication with Friends and Family: More than three times a week    Frequency of Social Gatherings with Friends and Family: Never    Attends Religious Services: Never    Database administrator or Organizations: No    Attends Banker Meetings: Never    Marital Status: Married  Catering manager Violence: Not At Risk (04/22/2023)   Humiliation, Afraid, Rape, and Kick questionnaire    Fear of Current or Ex-Partner: No    Emotionally Abused: No    Physically Abused: No    Sexually Abused: No    Family History:   Family History  Problem Relation Age of Onset   Heart attack Mother    Hypertension Brother    Kidney disease Brother    Diabetes Brother    Heart failure Maternal Grandfather      ROS:  Please see the history of present illness.  All other ROS reviewed and negative.     Physical Exam/Data:   Vitals:   04/22/23 0100 04/22/23 0645 04/22/23 0727 04/22/23 0800  BP: (!) 156/96 (!) 128/56  (!) 148/84  Pulse: (!) 115 60  (!) 47  Resp: 14 14  13   Temp:   97.8 F (36.6 C)   TempSrc:   Oral   SpO2: 98% 100%  99%  Weight:      Height:       No intake or output data in the 24 hours ending 04/22/23 1018    04/21/2023    8:19 PM 03/22/2023    2:26 PM 01/17/2023    2:23 PM  Last 3 Weights  Weight (lbs) 160 lb 160 lb 159 lb 4.8 oz  Weight (kg) 72.576 kg 72.576 kg 72.258 kg     Body mass index is 29.26 kg/m.  General:  Well nourished, well developed, in no acute distress HEENT: normal Neck: no JVD Vascular: No carotid bruits; Distal pulses 2+ bilaterally Cardiac:  irreg; no murmurs, gallops or rubs Lungs:  CTA b/l, no wheezing, rhonchi or rales  Abd: soft, nontender Ext: no edema Musculoskeletal:  No  deformities Skin: warm and dry  Neuro:  no focal abnormalities noted Psych:  Normal affect   EKG:  The EKG was personally reviewed and demonstrates:    #1 ATach 123bpm #2 SR 72bpm, QTc #3 Atach vs ST 115bpm, PVC  Telemetry:  Telemetry was personally reviewed and demonstrates:    Looks like she remains in an Atach with intermittent block, overall HRs 80's-90's  Relevant CV Studies:  Aug 2024 monitor Patient had a min HR of 37 bpm, max HR of 131 bpm, and avg HR of 59 bpm.  Predominant underlying rhythm was Sinus Rhythm.  Atrial Flutter/SVT occurred (13% burden), ranging from 39-131 bpm (avg of 81 bpm), the longest 1 day 20 hours with an avg rate of 81 bpm.  Triggered episodes associated with atrial flutter/SVT Less than 1% ventricular and supraventricular ectopy     12/07/21: TTE 1. Left ventricular ejection fraction, by estimation, is 55 to 60%. Left  ventricular ejection fraction by 3D volume is 55 %. The left ventricle has  normal function. The left ventricle has no regional wall motion  abnormalities.  Left ventricular diastolic   function could not be evaluated. Elevated left ventricular end-diastolic  pressure. The average left ventricular global longitudinal strain is -16.6  %. The global longitudinal strain is normal.   2. Right ventricular systolic function is normal. The right ventricular  size is normal. There is normal pulmonary artery systolic pressure. The  estimated right ventricular systolic pressure is 25.8 mmHg.   3. Left atrial size was mildly dilated.   4. The mitral valve has been repaired/replaced. No evidence of mitral  valve regurgitation. No evidence of mitral stenosis. The mean mitral valve  gradient is 3.0 mmHg at a HR of 52bpm. Echo findings are consistent with  normal structure and function of  the mitral valve prosthesis.   5. The aortic valve is tricuspid. There is moderate calcification of the  aortic valve. There is moderate thickening of  the aortic valve. Aortic  valve regurgitation is mild. Aortic valve sclerosis/calcification is  present, without any evidence of aortic  stenosis. Aortic regurgitation PHT measures 448 msec.   6. Pulmonic valve regurgitation is moderate.   7. The inferior vena cava is normal in size with greater than 50%  respiratory variability, suggesting right atrial pressure of 3 mmHg.     Laboratory Data:  High Sensitivity Troponin:  No results for input(s): "TROPONINIHS" in the last 720 hours.   Chemistry Recent Labs  Lab 04/21/23 2037  NA 140  K 4.1  CL 108  CO2 21*  GLUCOSE 113*  BUN 14  CREATININE 0.99  CALCIUM 9.4  MG 2.3  GFRNONAA 58*  ANIONGAP 11    No results for input(s): "PROT", "ALBUMIN", "AST", "ALT", "ALKPHOS", "BILITOT" in the last 168 hours. Lipids No results for input(s): "CHOL", "TRIG", "HDL", "LABVLDL", "LDLCALC", "CHOLHDL" in the last 168 hours.  Hematology Recent Labs  Lab 04/21/23 2037  WBC 9.8  RBC 4.62  HGB 13.9  HCT 43.3  MCV 93.7  MCH 30.1  MCHC 32.1  RDW 14.6  PLT 237   Thyroid No results for input(s): "TSH", "FREET4" in the last 168 hours.  BNPNo results for input(s): "BNP", "PROBNP" in the last 168 hours.  DDimer No results for input(s): "DDIMER" in the last 168 hours.   Radiology/Studies:  DG Chest 2 View Result Date: 04/21/2023 CLINICAL DATA:  chest pain, shob EXAM: CHEST - 2 VIEW COMPARISON:  Chest x-ray 10/07/2022 CT cardiac 02/16/2022 FINDINGS: The heart and mediastinal contours are within normal limits. Atrial appendage clip and replaced mitral valve. Atherosclerotic plaque. Lingular atelectasis. No focal consolidation. No pulmonary edema. No pleural effusion. No pneumothorax. No acute osseous abnormality. IMPRESSION: 1. No active cardiopulmonary disease. 2.  Aortic Atherosclerosis (ICD10-I70.0). Electronically Signed   By: Tish Frederickson M.D.   On: 04/21/2023 21:39     Assessment and Plan:   1. Persistent AFib 2. SVT 3.  AFlutter CHA2DS2Vasc is 4, on Eliquis, appropriately dosed     On Tikosyn, w/stable QTc   ATach changed her coreg to metop  25mg  Q 6 hours  Follow labs daily Creat a little higher then her baseline >> stay at for now Daily EKGs   4. MVR (bioprosthetic) Stable function 2023  5. Chronic pericardial effusion (trivial by last echo 2022, none described 2023)   6. HTN     Home meds  8. Diastolic dysfunction Volume stable   Risk Assessment/Risk Scores:   For questions or updates, please contact [facility] Please consult [email or web address] for contact info under    Signed, Luster Landsberg  Norberto Sorenson, PA-C  04/22/2023 10:18 AM  I have seen and examined this patient with Mary Farrell.  Agree with above, note added to reflect my findings.  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  She presented to the hospital with symptoms of fatigue and shortness of breath.  She noticed this on the day of discharge when she was exerting herself.  She felt well prior to that and was without complaint.  There are no exacerbating or alleviating factors.  On presentation of the emergency room, she was found to be in SVT.  She was cardioverted to sinus rhythm but quickly had a recurrence of her SVT.  She has been compliant with her dofetilide.  GEN: Well nourished, well developed, in no acute distress  HEENT: normal  Neck: no JVD, carotid bruits, or masses Cardiac: Tachycardic, irregular, no murmurs, rubs, or gallops,no edema  Respiratory:  clear to auscultation bilaterally, normal work of breathing GI: soft, nontender, nondistended, + BS MS: no deformity or atrophy  Skin: warm and dry Neuro:  Strength and sensation are intact Psych: euthymic mood, full affect   SVT: Patient appears to be in an atrial tachycardia.  She has variable AV conduction.  For now, we Mary Farrell continue her  dofetilide.  QTc remained stable.  Mary Farrell start metoprolol 25 mg every 6.  If this treats her SVT, would be able to be discharged on 50 mg of Toprol-XL twice daily.  Metoprolol can be titrated up based on blood pressure and heart rate.  If SVT was not able to be controlled with metoprolol, she may require pacemaker/AV node ablation versus SVT ablation. Persistent atrial fibrillation/flutter: Has had multiple ablations as well as surgical maze  Hypertension: Continue cCurrently on dofetilide.  Would continue with current management.Hypertension: Continue home medications current medications Chronic diastolic heart failure: No obvious volume overload  Talar Fraley M. Jonnie Truxillo MD 04/22/2023 12:05 PM

## 2023-04-23 DIAGNOSIS — I4719 Other supraventricular tachycardia: Secondary | ICD-10-CM | POA: Diagnosis not present

## 2023-04-23 LAB — BASIC METABOLIC PANEL WITH GFR
Anion gap: 8 (ref 5–15)
BUN: 15 mg/dL (ref 8–23)
CO2: 24 mmol/L (ref 22–32)
Calcium: 8.7 mg/dL — ABNORMAL LOW (ref 8.9–10.3)
Chloride: 109 mmol/L (ref 98–111)
Creatinine, Ser: 0.87 mg/dL (ref 0.44–1.00)
GFR, Estimated: >60 mL/min (ref >60)
Glucose, Bld: 105 mg/dL — ABNORMAL HIGH (ref 70–99)
Potassium: 3.2 mmol/L — ABNORMAL LOW (ref 3.5–5.1)
Sodium: 141 mmol/L (ref 135–145)

## 2023-04-23 LAB — MAGNESIUM: Magnesium: 2.2 mg/dL (ref 1.7–2.4)

## 2023-04-23 MED ORDER — METOPROLOL TARTRATE 50 MG PO TABS
50.0000 mg | ORAL_TABLET | Freq: Four times a day (QID) | ORAL | Status: DC
  Filled 2023-04-23: qty 1

## 2023-04-23 MED ORDER — METOPROLOL TARTRATE 25 MG PO TABS
37.5000 mg | ORAL_TABLET | Freq: Four times a day (QID) | ORAL | Status: DC
  Administered 2023-04-23 – 2023-04-24 (×3): 37.5 mg via ORAL
  Filled 2023-04-23 (×4): qty 1

## 2023-04-23 MED ORDER — POTASSIUM CHLORIDE CRYS ER 20 MEQ PO TBCR
40.0000 meq | EXTENDED_RELEASE_TABLET | Freq: Two times a day (BID) | ORAL | Status: AC
  Administered 2023-04-23 (×2): 40 meq via ORAL
  Filled 2023-04-23 (×2): qty 2

## 2023-04-23 MED ORDER — MECLIZINE HCL 25 MG PO TABS
25.0000 mg | ORAL_TABLET | Freq: Two times a day (BID) | ORAL | Status: DC | PRN
  Administered 2023-04-23 – 2023-04-24 (×2): 25 mg via ORAL
  Filled 2023-04-23 (×2): qty 1

## 2023-04-23 MED ORDER — PHENYLEPHRINE IN HARD FAT 0.25 % RE SUPP
1.0000 | Freq: Two times a day (BID) | RECTAL | Status: DC | PRN
  Filled 2023-04-23 (×2): qty 1

## 2023-04-23 NOTE — Plan of Care (Signed)
   Problem: Nutrition: Goal: Adequate nutrition will be maintained Outcome: Progressing

## 2023-04-23 NOTE — Progress Notes (Signed)
 Went to give patient increased dose (50 mg) Metoprolol.  Patient complained of being more tired than this morning.  HR also running 80s-low 100s.  Dr.  Jimmey Ralph notified via secure chat, stated to give 37.5 mg.

## 2023-04-23 NOTE — Progress Notes (Signed)
   04/22/23 2353  Assess: MEWS Score  Temp 98.9 F (37.2 C)  BP 138/65  MAP (mmHg) 85  Pulse Rate (!) 113  ECG Heart Rate (!) 114  Resp 20  SpO2 95 %  O2 Device Room Air  Assess: MEWS Score  MEWS Temp 0  MEWS Systolic 0  MEWS Pulse 2  MEWS RR 0  MEWS LOC 0  MEWS Score 2  MEWS Score Color Yellow  Assess: if the MEWS score is Yellow or Red  Were vital signs accurate and taken at a resting state? Yes  Does the patient meet 2 or more of the SIRS criteria? No  MEWS guidelines implemented  Yes, yellow  Treat  MEWS Interventions Considered administering scheduled or prn medications/treatments as ordered  Take Vital Signs  Increase Vital Sign Frequency  Yellow: Q2hr x1, continue Q4hrs until patient remains green for 12hrs  Escalate  MEWS: Escalate Yellow: Discuss with charge nurse and consider notifying provider and/or RRT  Notify: Charge Nurse/RN  Name of Charge Nurse/RN Best boy, RN  Assess: SIRS CRITERIA  SIRS Temperature  0  SIRS Respirations  0  SIRS Pulse 1  SIRS WBC 0  SIRS Score Sum  1

## 2023-04-23 NOTE — Plan of Care (Signed)

## 2023-04-23 NOTE — Consult Note (Signed)
   Patient Name: Mary Farrell Date of Encounter: 04/23/2023 [facility] Cardiologist: Chilton Si, MD   Interval Summary  .    No acute overnight events.  Patient actually reports feeling relatively well.  She notices that her heart rate can be in the 120s with both rest and with ambulation.  No new or acute complaints today.  Vital Signs .    Vitals:   04/23/23 0347 04/23/23 0730 04/23/23 0836 04/23/23 1035  BP: (!) 103/44 121/76 137/78 139/77  Pulse: (!) 56 (!) 111 (!) 122   Resp: 18 17    Temp:      TempSrc:      SpO2: 94% 96%    Weight:      Height:        Intake/Output Summary (Last 24 hours) at 04/23/2023 1111 Last data filed at 04/23/2023 0844 Gross per 24 hour  Intake 540 ml  Output --  Net 540 ml      04/22/2023   12:30 PM 04/21/2023    8:19 PM 03/22/2023    2:26 PM  Last 3 Weights  Weight (lbs) 161 lb 6.4 oz 160 lb 160 lb  Weight (kg) 73.211 kg 72.576 kg 72.576 kg      Telemetry/ECG    Some sinus rhythm with frequent PACs, bursts of A. tach and sustained atrial tachycardia since early this morning.- Personally Reviewed  Physical Exam .   GEN: No acute distress.   Neck: No JVD Cardiac: Tachycardic and regular Respiratory: Clear to auscultation bilaterally. GI: Soft, nontender, non-distended  MS: No edema  Assessment & Plan .     #. SVT: Likely atrial tachycardia.  -Rates are still elevated on telemetry even at rest.  We will increase her metoprolol today to 37.5 mg every 6 hours and monitor for improvement in rate control.  If rates can be controlled with metoprolol then we will transition to long-acting on discharge and she will follow-up for outpatient discussion regarding catheter ablation versus pacemaker and AV node ablation.  #. Persistent atrial fibrillation/flutter: Has had multiple ablations as well as surgical maze. -Continue home dofetilide. -Continue home Eliquis.   For questions or updates, please contact [facility] Please consult [email or web address] for contact info under        Signed, Nobie Putnam, MD

## 2023-04-24 DIAGNOSIS — I4719 Other supraventricular tachycardia: Secondary | ICD-10-CM | POA: Diagnosis not present

## 2023-04-24 LAB — BASIC METABOLIC PANEL WITH GFR
Anion gap: 10 (ref 5–15)
BUN: 13 mg/dL (ref 8–23)
CO2: 22 mmol/L (ref 22–32)
Calcium: 8.6 mg/dL — ABNORMAL LOW (ref 8.9–10.3)
Chloride: 110 mmol/L (ref 98–111)
Creatinine, Ser: 0.81 mg/dL (ref 0.44–1.00)
GFR, Estimated: >60 mL/min (ref >60)
Glucose, Bld: 98 mg/dL (ref 70–99)
Potassium: 4.4 mmol/L (ref 3.5–5.1)
Sodium: 142 mmol/L (ref 135–145)

## 2023-04-24 LAB — MAGNESIUM: Magnesium: 2 mg/dL (ref 1.7–2.4)

## 2023-04-24 MED ORDER — TRIMETHOBENZAMIDE HCL 100 MG/ML IM SOLN: 200.0000 mg | Freq: Three times a day (TID) | INTRAMUSCULAR | Status: DC | PRN

## 2023-04-24 MED ORDER — METOPROLOL TARTRATE 5 MG/5ML IV SOLN
5.0000 mg | INTRAVENOUS | Status: AC | PRN
Start: 2023-04-24 — End: 2023-04-24
  Administered 2023-04-24 (×3): 5 mg via INTRAVENOUS
  Filled 2023-04-24: qty 5

## 2023-04-24 MED ORDER — WITCH HAZEL-GLYCERIN EX PADS
MEDICATED_PAD | CUTANEOUS | Status: DC | PRN
  Administered 2023-04-24: 1 via TOPICAL
  Filled 2023-04-24: qty 100

## 2023-04-24 MED ORDER — ALUM & MAG HYDROXIDE-SIMETH 200-200-20 MG/5ML PO SUSP
30.0000 mL | Freq: Once | ORAL | Status: AC
  Administered 2023-04-24: 30 mL via ORAL
  Filled 2023-04-24: qty 30

## 2023-04-24 MED ORDER — METOPROLOL SUCCINATE ER 100 MG PO TB24
100.0000 mg | ORAL_TABLET | Freq: Two times a day (BID) | ORAL | Status: DC
  Administered 2023-04-24 – 2023-04-25 (×3): 100 mg via ORAL
  Filled 2023-04-24 (×3): qty 1

## 2023-04-24 MED ORDER — PHENYLEPHRINE-MINERAL OIL-PET 0.25-14-74.9 % RE OINT
1.0000 | TOPICAL_OINTMENT | Freq: Two times a day (BID) | RECTAL | Status: DC
  Administered 2023-04-24 – 2023-04-25 (×2): 1 via RECTAL
  Filled 2023-04-24 (×2): qty 57

## 2023-04-24 MED ORDER — PHENYLEPHRINE IN HARD FAT 0.25 % RE SUPP
1.0000 | Freq: Two times a day (BID) | RECTAL | Status: DC
  Filled 2023-04-24: qty 1

## 2023-04-24 NOTE — Progress Notes (Signed)
   Patient Name: Mary Farrell Date of Encounter: 04/24/2023 [facility] Cardiologist: Chilton Si, MD   Interval Summary  .    Continues to be tachycardic. More fatigue this morning. Some nausea today as well. Hemorrhoidal pain acting up.  Vital Signs .    Vitals:   04/23/23 1928 04/23/23 2320 04/24/23 0507 04/24/23 0912  BP: 133/74 127/83 132/66 (!) 141/83  Pulse: (!) 118 (!) 116 81   Resp: 16 16 18 18   Temp: 98 F (36.7 C) 98.2 F (36.8 C) 97.9 F (36.6 C) 98.1 F (36.7 C)  TempSrc: Oral Oral Oral Oral  SpO2: 97% 99% 99%   Weight:      Height:        Intake/Output Summary (Last 24 hours) at 04/24/2023 1039 Last data filed at 04/24/2023 0500 Gross per 24 hour  Intake 120 ml  Output --  Net 120 ml      04/22/2023   12:30 PM 04/21/2023    8:19 PM 03/22/2023    2:26 PM  Last 3 Weights  Weight (lbs) 161 lb 6.4 oz 160 lb 160 lb  Weight (kg) 73.211 kg 72.576 kg 72.576 kg      Telemetry/ECG    Atrial tachycardia, variable conduction - Personally Reviewed  Physical Exam .   GEN: No acute distress.   Neck: No JVD Cardiac: Tachycardic, irregular.  Respiratory: Clear to auscultation bilaterally. GI: Soft, nontender, non-distended  MS: No edema  Assessment & Plan .     #. SVT: Likely atrial tachycardia.  -Rates are still elevated on telemetry even at rest.  We will transition her to metop XL 100mg  BID and monitor for improvement in rate control.  If rates can be controlled she will follow-up for outpatient discussion regarding catheter ablation versus pacemaker and AV node ablation. If she continues to be symptomatic with uncontrolled rates then we may pursue procedural intervention this admission. - Repeat TTE to reassess LVEF in the event we pursue pacemaker this week.    #. Persistent atrial fibrillation/flutter: Has had multiple ablations as well as surgical maze. -Continue home dofetilide. -Continue home Eliquis.   #. Hemorrhoids:  - Preparation  H ointment BID.  #. Nausea - GI cocktail. - Tigan as needed.  For questions or updates, please contact [facility] Please consult [email or web address] for contact info under        Signed, Nobie Putnam, MD

## 2023-04-24 NOTE — Plan of Care (Addendum)
 Cross Cover Note   Called re tachycardia ~130 this evening. Telemetry reveals likely atrial tachycardia at 130 bpm (flat line). Pt on exam reports feeling generally, mildly nauseas (has been going on this hospital stay). Atach notoriously hard to rate control. Already on high dose AV nodal blockade.  Plan: -EKG now -metoprolol tartrate IV 5mg  every 5 minutes up to 3 doses, ulikely to control rate but will attempt -if remains tachycardic, will leave be overnight and watch closely on tele  19:15pm update: after 3 doses of IV metoprolol, no changed in HR, mild drop in BP. EKG confirms likely atrial tachycardia. Plan to monitor overnight, no further meds at this time.   Achille Rich, MD Cardiology

## 2023-04-24 NOTE — Progress Notes (Signed)
 At 1729 RN notified by central telemetry monitoring that patients HR is sustaining at 130.  RN assessed patient, patient asymptomatic.  RN notified Dr Achille Rich, who came to bedside to assess patient. New orders placed.

## 2023-04-24 NOTE — Plan of Care (Signed)
   Problem: Nutrition: Goal: Adequate nutrition will be maintained Outcome: Progressing

## 2023-04-24 NOTE — Progress Notes (Signed)
 Patient's am EKG with critical results of Mobitz I, VSS, no symptoms reported.  Dr. Brayton Layman notified of results.

## 2023-04-25 ENCOUNTER — Inpatient Hospital Stay (HOSPITAL_COMMUNITY)

## 2023-04-25 DIAGNOSIS — R9431 Abnormal electrocardiogram [ECG] [EKG]: Secondary | ICD-10-CM

## 2023-04-25 DIAGNOSIS — I4719 Other supraventricular tachycardia: Secondary | ICD-10-CM | POA: Diagnosis not present

## 2023-04-25 LAB — ECHOCARDIOGRAM COMPLETE
AR max vel: 2.71 cm2
AV Area VTI: 3.43 cm2
AV Area mean vel: 2.38 cm2
AV Mean grad: 5 mmHg
AV Peak grad: 7.7 mmHg
Ao pk vel: 1.39 m/s
Area-P 1/2: 2.32 cm2
Height: 60 in
MV VTI: 3.88 cm2
S' Lateral: 2.9 cm
Weight: 2582.4 [oz_av]

## 2023-04-25 LAB — BASIC METABOLIC PANEL WITH GFR
Anion gap: 12 (ref 5–15)
BUN: 12 mg/dL (ref 8–23)
CO2: 22 mmol/L (ref 22–32)
Calcium: 8.9 mg/dL (ref 8.9–10.3)
Chloride: 108 mmol/L (ref 98–111)
Creatinine, Ser: 0.9 mg/dL (ref 0.44–1.00)
GFR, Estimated: >60 mL/min (ref >60)
Glucose, Bld: 93 mg/dL (ref 70–99)
Potassium: 3.7 mmol/L (ref 3.5–5.1)
Sodium: 142 mmol/L (ref 135–145)

## 2023-04-25 LAB — MAGNESIUM: Magnesium: 2 mg/dL (ref 1.7–2.4)

## 2023-04-25 MED ORDER — DILTIAZEM HCL ER COATED BEADS 240 MG PO CP24: 240.0000 mg | ORAL_CAPSULE | Freq: Every day | ORAL | 3 refills | Status: DC

## 2023-04-25 MED ORDER — DILTIAZEM HCL ER COATED BEADS 240 MG PO CP24
240.0000 mg | ORAL_CAPSULE | Freq: Every day | ORAL | Status: DC
Start: 2023-04-25 — End: 2023-04-25

## 2023-04-25 MED ORDER — METOPROLOL SUCCINATE ER 100 MG PO TB24: 100.0000 mg | ORAL_TABLET | Freq: Two times a day (BID) | ORAL | 3 refills | Status: DC

## 2023-04-25 MED ORDER — POTASSIUM CHLORIDE CRYS ER 20 MEQ PO TBCR: 40.0000 meq | EXTENDED_RELEASE_TABLET | Freq: Once | ORAL | Status: DC

## 2023-04-25 NOTE — Progress Notes (Signed)
 Discharge instructions reviewed with pt. Copy of instructions given to pt. Pt informed her scripts were sent to her pharmacy for pick up.  Pt will be d/c'd via wheelchair with belongings and will be escorted by staff, pt's husband is at the ER entrance.   Trayon Krantz,RN SWOT

## 2023-04-25 NOTE — TOC Initial Note (Signed)
 Transition of Care [facility]) - Initial/Assessment Note    Patient Details  Name: Mary Farrell MRN: [id number] Date of Birth: [birth year]  [facility] of [facility]) CM/SW Contact:    Gala Lewandowsky, RN Phone Number: 04/25/2023, 11:23 AM  Clinical Narrative:  Patient presented for SVT- EP to follow up for possible PPM on 04-28-23. PTA patient states she is from home with spouse that has dementia. Patient states spouse is driving to pick her up, Case Manager did offer patient a cab home and she declined. Patient has DME rolling walker in the home that she does not use. No home needs identified at the time of visit.                Expected Discharge Plan: Home/Self Care Barriers to Discharge: No Barriers Identified   Patient Goals and CMS Choice Patient states their goals for this hospitalization and ongoing recovery are:: plan to return home with sposue.   Choice offered to / list presented to : NA  Expected Discharge Plan and Services In-house Referral: NA Discharge Planning Services: CM Consult Post Acute Care Choice: NA Living arrangements for the past 2 months: Single Family Home Expected Discharge Date: 04/25/23                 DME Agency: NA       HH Arranged: NA  Prior Living Arrangements/Services Living arrangements for the past 2 months: Single Family Home Lives with:: Spouse Patient language and need for interpreter reviewed:: Yes Do you feel safe going back to the place where you live?: Yes      Need for Family Participation in Patient Care: No (Comment) Care giver support system in place?: No (comment) Current home services: DME (rolling walker) Criminal Activity/Legal Involvement Pertinent to Current Situation/Hospitalization: No - Comment as needed  Activities of Daily Living   ADL Screening (condition at time of admission) Independently performs ADLs?: Yes (appropriate for developmental age) Is the patient deaf or have difficulty hearing?: Yes (wears  aids) Does the patient have difficulty seeing, even when wearing glasses/contacts?: No Does the patient have difficulty concentrating, remembering, or making decisions?: No  Permission Sought/Granted Permission sought to share information with : Family Supports, Case Manager   Emotional Assessment Appearance:: Appears stated age Attitude/Demeanor/Rapport: Engaged Affect (typically observed): Appropriate Orientation: : Oriented to Self, Oriented to Place, Oriented to  Time, Oriented to Situation Alcohol / Substance Use: Not Applicable Psych Involvement: No (comment)  Admission diagnosis:  Atrial tachycardia (HCC) [I47.19] Atrial flutter, unspecified type [facility]) [I48.92] Patient Active Problem List   Diagnosis Date Noted   Atrial tachycardia (HCC) 04/22/2023   Atrial flutter with rapid ventricular response (HCC)    Hypercoagulable state due to persistent atrial fibrillation (HCC) 09/01/2021   Obesity (BMI 30.0-34.9) 02/13/2021   Stress 02/13/2021   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   SVT (supraventricular tachycardia) (HCC) 01/22/2019   S/P minimally-invasive mitral valve replacement with bioprosthetic valve 12/13/2018   S/P minimally-invasive maze operation for atrial fibrillation 12/13/2018   Nausea 05/01/2018   Vertigo 05/01/2018   Lump on finger 12/30/2017   Pain in finger 12/29/2017   Persistent atrial fibrillation/flutter 03/10/2017   Asymptomatic microscopic hematuria 11/02/2016   Rash 06/08/2016   Atypical chest pain 05/13/2016   Pericardial effusion 09/18/2015   Aortic regurgitation 08/13/2015   Dysuria 09/24/2014   IC (interstitial cystitis) 09/24/2014   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  [place] [address] [place] [address] Phone: [phone] Fax: [phone]  [facility] - [place], [place] - [address] [place] [address] Phone: [phone] Fax: [phone]  [id number] - [place], [place] - [address] AT [facility] [address] [place] [address] Phone: [phone] Fax: [phone]  Social Drivers of Health (SDOH) Social History: SDOH Screenings   Food Insecurity: No Food Insecurity (04/22/2023)  Housing: Low Risk  (04/22/2023)  Transportation Needs: No Transportation Needs (04/22/2023)  Utilities: Not At Risk (04/22/2023)  Depression (PHQ2-9): Low Risk  (03/22/2019)  Social Connections: Moderately Isolated (04/22/2023)  Tobacco Use: Low Risk  (04/22/2023)    Readmission Risk Interventions     No data to display

## 2023-04-25 NOTE — Discharge Summary (Addendum)
 ELECTROPHYSIOLOGY DISCHARGE SUMMARY    Patient ID: Mary Farrell,  MRN: [id number], DOB/AGE: [birth year] 78 y.o.  Admit date: 04/21/2023 Discharge date: 04/25/2023  Primary Care Physician: Jimmey Mould, MD  Primary Cardiologist: Maudine Sos, MD  Electrophysiologist: Dr. 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 ECHO > results pending       Brief HPI: Mary Farrell is a 78 y.o. female with a history of AF s/p PVI ablation 01/2022, SVT, AFL s/p CTI ablation 2019, chronic pericardial effusion, MAZE, rheumatic MS s/p minimally invasive MVR 11/202 admitted for SVT.   Hospital Course:  The patient was admitted 04/21/23 with reports of sudden onset heart "pounding / racing" while bringing in the groceries.  She reported it did not feel like her AF but definitely not normal.  She had no CP, syncope or shortness of breath. On ER evaluation, she was found to be in SVT and cardioverted to SR. Initial labs: K 4.1, Mg+ 2.3, Cr 0.99, WBC 9.8, Hgb 13, Platelets 237. The SVT was thought to be an atrial tachycardia.  Her home coreg was changed to metoprolol.  Cardizem was up-titrated to 240 mg daily.  She remained on dofetilide. Given persistent AF and AT, the patient is being considered for AV node ablation and PPM.  ECHO was completed before discharge, results pending.   They were monitored on telemetry overnight which demonstrated AFL 60-120 bpm - large 60-80 bpm. The patient was examined and considered to be stable for discharge.   The patient Mary Farrell be planned for possible EPS / PPM by Dr. Lawana Pray on 04/28/23.  Further follow up Mary Farrell be arranged post procedure.     AFib/AAD Amiodarone not tolerated 2/2 GI  Flecainide 2017 >> failed and had PR prolongation PVI ablation 03/10/2017 CTI/PVI Ablation May 2019 MAZE 2020 Tikosyn Jan 2021 PVI ablation 02/23/22  Physical Exam: Vitals:   04/24/23 2028 04/24/23 2116 04/25/23 0537 04/25/23 0751  BP: 120/81 120/81 106/61 135/87  Pulse: (!) 112 (!) 116 92 96  Resp: 16  18 17   Temp: 97.9 F (36.6 C)  98.5 F (36.9 C) 98.1 F (36.7 C)  TempSrc: Oral  Oral Oral  SpO2: 96%  97% 98%  Weight:      Height:        GEN- pleasant elderly female lying in bed in NAD. A&O x 3 HEENT: Normocephalic, atraumatic, wears glasses Lungs: CTAB, Normal effort.  Heart- RRR, No M/G/R.  GI- Soft, NT, ND.  Extremities- No clubbing, cyanosis, or edema    Discharge Medications:  Allergies as of 04/25/2023       Reactions   Tizanidine Nausea Only   Amoxicillin Diarrhea   Has patient had a PCN reaction causing immediate rash, facial/tongue/throat swelling, SOB or lightheadedness with hypotension: no Has patient had a PCN reaction causing severe rash involving mucus membranes or skin necrosis: no Has patient had a PCN reaction that required hospitalization: no Has patient had a PCN reaction occurring within the last 10 years: no If all of the above answers are "NO", then may proceed with Cephalosporin use.   Metoprolol Tartrate Other (See Comments)   Mouth sores "mouth tastes like mold"   Oxaprozin Nausea  Only        Medication List     STOP taking these medications    carvedilol 3.125 MG tablet Commonly known as: COREG       TAKE these medications    acetaminophen 500 MG tablet Commonly known as: TYLENOL Take 500 mg by mouth daily as needed for moderate pain.   ALPRAZolam 0.25 MG tablet Commonly known as: XANAX Take 0.125-0.25 mg by mouth See admin instructions. Take 0.25 mg at night, may take 0.125 mg dose as needed for anxiety   atorvastatin 10 MG tablet Commonly known as: LIPITOR Take 10 mg  by mouth 2 (two) times a week.   CALCIUM + D PO Take 1 tablet by mouth daily.   cetirizine 10 MG tablet Commonly known as: ZYRTEC Take 10 mg by mouth daily.   cyanocobalamin 1000 MCG tablet Commonly known as: VITAMIN B12 Take 1,000 mcg by mouth daily.   diltiazem 240 MG 24 hr capsule Commonly known as: CARDIZEM CD Take 1 capsule (240 mg total) by mouth at bedtime. What changed:  medication strength how much to take   diltiazem 30 MG tablet Commonly known as: CARDIZEM Take 1 tablet (30 mg total) by mouth 4 (four) times daily as needed (for heart rates greater than 100).   dofetilide 500 MCG capsule Commonly known as: TIKOSYN Take 1 capsule (500 mcg total) by mouth 2 (two) times daily.   dorzolamide-timolol 2-0.5 % ophthalmic solution Commonly known as: COSOPT Place 1 drop into both eyes 2 (two) times daily.   Eliquis 5 MG Tabs tablet Generic drug: apixaban TAKE 1 TABLET BY MOUTH TWICE  DAILY   fluticasone 50 MCG/ACT nasal spray Commonly known as: FLONASE Place 1 spray into both nostrils daily.   furosemide 20 MG tablet Commonly known as: LASIX Take 1 tablet (20 mg total) by mouth daily as needed for fluid. For >3lb/overnight or >5lb/weekly note dose   gabapentin 300 MG capsule Commonly known as: NEURONTIN Take 300 mg by mouth 3 (three) times daily.   hydrALAZINE 25 MG tablet Commonly known as: APRESOLINE Take 1 tablet (25 mg total) by mouth in the morning and at bedtime.   latanoprost 0.005 % ophthalmic solution Commonly known as: XALATAN Place 1 drop into both eyes at bedtime.   magnesium oxide 400 (240 Mg) MG tablet Commonly known as: MAG-OX Take 1 tablet (400 mg total) by mouth every other day.   meclizine 25 MG tablet Commonly known as: ANTIVERT Take 25 mg by mouth as needed for dizziness or nausea.   metoprolol succinate 100 MG 24 hr tablet Commonly known as: TOPROL-XL Take 1 tablet (100 mg total) by mouth 2 (two) times daily. Take with or  immediately following a meal.   multivitamin with minerals Tabs tablet Take 1 tablet by mouth daily.   NON FORMULARY Apply 1 application topically 2 (two) times daily as needed (for eczema). Traimcinolone/[facility] Moist Cream   oxybutynin 10 MG 24 hr tablet Commonly known as: DITROPAN-XL Take 10 mg by mouth daily. What changed:  when to take this reasons to take this   pantoprazole 40 MG tablet Commonly known as: PROTONIX Take 40 mg by mouth daily. What changed: when to take this   polyethylene glycol 17 g packet Commonly known as: MIRALAX / GLYCOLAX Take 17 g by mouth every other day.   potassium chloride 10 MEQ tablet Commonly known as: KLOR-CON Take 1 tablet (10 mEq total) by mouth daily as needed (when you take Lasix).  PREPARATION H EX Apply 1 Application topically daily as needed (hemorrhoids).   PRESCRIPTION MEDICATION Inhale into the lungs at bedtime. CPAP   PROBIOTIC PO Take 1 capsule by mouth daily.   sodium chloride 0.65 % Soln nasal spray Commonly known as: OCEAN Place 1 spray into both nostrils at bedtime.   valsartan 160 MG tablet Commonly known as: DIOVAN TAKE 1 TABLET BY MOUTH TWICE  DAILY   Vitamin D 50 MCG (2000 UT) tablet Take 2,000 Units by mouth daily.   Yuvafem 10 MCG Tabs vaginal tablet Generic drug: Estradiol Place 1 each vaginally daily.        Disposition: Home with usual follow up as in AVS  Duration of Discharge Encounter:  APP time: 36 minutes  Signed, Creighton Doffing, NP-C, AGACNP-BC [facility] - Electrophysiology  04/25/2023, 10:57 AM   I have seen and examined this patient with Creighton Doffing.  Agree with above, note added to reflect my findings.  Patient is feeling improved with less fatigue, shortness of breath, palpitations.  Wishing to go home today.  GEN: Well nourished, well developed, in no acute distress  HEENT: normal  Neck: no JVD, carotid bruits, or masses Cardiac: Irregular, tachycardic; no murmurs,  rubs, or gallops,no edema  Respiratory:  clear to auscultation bilaterally, normal work of breathing GI: soft, nontender, nondistended, + BS MS: no deformity or atrophy  Skin: warm and dry Neuro:  Strength and sensation are intact Psych: euthymic mood, full affect   Atrial tachycardia: Has been in incessant atrial tachycardia since admission to the hospital.  This has not been improved with cardioversion, multiple medications.  Her rate control is better.  She is feeling improved.  Mary Farrell plan for discharge from the hospital today.  We Mary Farrell have her come back later this week for either atrial tachycardia ablation versus AV node ablation and pacemaker implant. Persistent atrial fibrillation/flutter: Post multiple ablations, surgical maze, and on Tikosyn.  Mary Farrell continue with current management. Rheumatic mitral valve stenosis: Post MVR.  Plan per primary cardiology. Secondary hypercoagulable state: Continue anticoagulation for atrial fibrillation  MD time on discharge, 20 minutes  Mary Huot M. Micheala Morissette MD 04/25/2023 11:50 AM

## 2023-04-26 ENCOUNTER — Telehealth: Payer: Self-pay | Admitting: *Deleted

## 2023-04-26 NOTE — Telephone Encounter (Signed)
 Pt aware ablation scheduled for Thursday 4/3, reviewed procedure instructions with pt.  Aware NPO after MN the night before, to hold her Eliquis after tonight, arrive at 10:30 am to [facility]. Patient verbalized understanding and agreeable to plan.

## 2023-04-27 NOTE — Pre-Procedure Instructions (Signed)
 Instructed patient on the following items: Arrival time 1000 Nothing to eat or drink after midnight No meds AM of procedure Responsible person to drive you home and stay with you for 24 hrs  Have you missed any doses of anti-coagulant Eliquis- last dose 4/1

## 2023-04-28 ENCOUNTER — Ambulatory Visit (HOSPITAL_COMMUNITY): Admitting: Anesthesiology

## 2023-04-28 ENCOUNTER — Other Ambulatory Visit: Payer: Self-pay

## 2023-04-28 ENCOUNTER — Ambulatory Visit (HOSPITAL_COMMUNITY)
Admission: RE | Admit: 2023-04-28 | Discharge: 2023-04-28 | Disposition: A | Discharge status: Home / Self Care | Attending: Cardiology | Admitting: Cardiology

## 2023-04-28 ENCOUNTER — Encounter (HOSPITAL_COMMUNITY): Admission: RE | Discharge status: Home / Self Care | Source: Home / Self Care | Attending: Cardiology

## 2023-04-28 DIAGNOSIS — I4819 Other persistent atrial fibrillation: Secondary | ICD-10-CM | POA: Diagnosis not present

## 2023-04-28 DIAGNOSIS — I4719 Other supraventricular tachycardia: Secondary | ICD-10-CM | POA: Insufficient documentation

## 2023-04-28 DIAGNOSIS — G4730 Sleep apnea, unspecified: Secondary | ICD-10-CM | POA: Insufficient documentation

## 2023-04-28 DIAGNOSIS — Z8249 Family history of ischemic heart disease and other diseases of the circulatory system: Secondary | ICD-10-CM | POA: Diagnosis not present

## 2023-04-28 DIAGNOSIS — M1990 Unspecified osteoarthritis, unspecified site: Secondary | ICD-10-CM | POA: Insufficient documentation

## 2023-04-28 DIAGNOSIS — F419 Anxiety disorder, unspecified: Secondary | ICD-10-CM | POA: Diagnosis not present

## 2023-04-28 DIAGNOSIS — R000 Tachycardia, unspecified: Secondary | ICD-10-CM

## 2023-04-28 DIAGNOSIS — I10 Essential (primary) hypertension: Secondary | ICD-10-CM | POA: Diagnosis not present

## 2023-04-28 DIAGNOSIS — G4733 Obstructive sleep apnea (adult) (pediatric): Secondary | ICD-10-CM

## 2023-04-28 DIAGNOSIS — I110 Hypertensive heart disease with heart failure: Secondary | ICD-10-CM | POA: Diagnosis not present

## 2023-04-28 DIAGNOSIS — I509 Heart failure, unspecified: Secondary | ICD-10-CM | POA: Diagnosis not present

## 2023-04-28 DIAGNOSIS — I219 Acute myocardial infarction, unspecified: Secondary | ICD-10-CM

## 2023-04-28 HISTORY — PX: AV NODE ABLATION: EP1193

## 2023-04-28 HISTORY — PX: ATRIAL TACH ABLATION: EP1192

## 2023-04-28 HISTORY — PX: PACEMAKER IMPLANT: EP1218

## 2023-04-28 SURGERY — ATRIAL TACH ABLATION
Anesthesia: General

## 2023-04-28 MED ORDER — VANCOMYCIN HCL IN DEXTROSE 1-5 GM/200ML-% IV SOLN
INTRAVENOUS | Status: AC
  Filled 2023-04-28: qty 200

## 2023-04-28 MED ORDER — HEPARIN SODIUM (PORCINE) 1000 UNIT/ML IJ SOLN
INTRAMUSCULAR | Status: DC | PRN
  Administered 2023-04-28: 1000 [IU] via INTRAVENOUS

## 2023-04-28 MED ORDER — PROTAMINE SULFATE 10 MG/ML IV SOLN
INTRAVENOUS | Status: DC | PRN
  Administered 2023-04-28: 40 mg via INTRAVENOUS

## 2023-04-28 MED ORDER — HEPARIN (PORCINE) IN NACL 1000-0.9 UT/500ML-% IV SOLN
INTRAVENOUS | Status: DC | PRN
  Administered 2023-04-28 (×2): 500 mL

## 2023-04-28 MED ORDER — ONDANSETRON HCL 4 MG/2ML IJ SOLN: 4.0000 mg | Freq: Four times a day (QID) | INTRAMUSCULAR | Status: DC | PRN

## 2023-04-28 MED ORDER — CEFAZOLIN SODIUM-DEXTROSE 2-4 GM/100ML-% IV SOLN: 2.0000 g | INTRAVENOUS | Status: DC

## 2023-04-28 MED ORDER — BUPIVACAINE HCL (PF) 0.25 % IJ SOLN
INTRAMUSCULAR | Status: AC
Start: 2023-04-28 — End: ?
  Filled 2023-04-28: qty 60

## 2023-04-28 MED ORDER — EPHEDRINE SULFATE (PRESSORS) 50 MG/ML IJ SOLN
INTRAMUSCULAR | Status: DC | PRN
  Administered 2023-04-28 (×2): 10 mg via INTRAVENOUS
  Administered 2023-04-28: 5 mg via INTRAVENOUS

## 2023-04-28 MED ORDER — HEPARIN SODIUM (PORCINE) 1000 UNIT/ML IJ SOLN
INTRAMUSCULAR | Status: AC
  Filled 2023-04-28: qty 10

## 2023-04-28 MED ORDER — HEPARIN SODIUM (PORCINE) 1000 UNIT/ML IJ SOLN
INTRAMUSCULAR | Status: DC | PRN
  Administered 2023-04-28: 13000 [IU] via INTRAVENOUS

## 2023-04-28 MED ORDER — ACETAMINOPHEN 325 MG PO TABS: 650.0000 mg | ORAL_TABLET | ORAL | Status: DC | PRN

## 2023-04-28 MED ORDER — PROPOFOL 10 MG/ML IV BOLUS
INTRAVENOUS | Status: DC | PRN
  Administered 2023-04-28: 60 mg via INTRAVENOUS

## 2023-04-28 MED ORDER — VANCOMYCIN HCL 1000 MG IV SOLR
INTRAVENOUS | Status: DC | PRN
  Administered 2023-04-28: 1000 mg via INTRAVENOUS

## 2023-04-28 MED ORDER — PHENYLEPHRINE HCL-NACL 20-0.9 MG/250ML-% IV SOLN
INTRAVENOUS | Status: DC | PRN
  Administered 2023-04-28: 20 ug/min via INTRAVENOUS

## 2023-04-28 MED ORDER — PROPOFOL 500 MG/50ML IV EMUL
INTRAVENOUS | Status: DC | PRN
  Administered 2023-04-28: 100 ug/kg/min via INTRAVENOUS

## 2023-04-28 MED ORDER — SODIUM CHLORIDE 0.9% FLUSH: 3.0000 mL | Freq: Two times a day (BID) | INTRAVENOUS | Status: DC

## 2023-04-28 MED ORDER — BUPIVACAINE HCL (PF) 0.25 % IJ SOLN
INTRAMUSCULAR | Status: DC | PRN
  Administered 2023-04-28: 60 mL

## 2023-04-28 MED ORDER — SODIUM CHLORIDE 0.9% FLUSH: 3.0000 mL | INTRAVENOUS | Status: DC | PRN

## 2023-04-28 MED ORDER — LACTATED RINGERS IV SOLN: INTRAVENOUS | Status: DC | PRN

## 2023-04-28 MED ORDER — LIDOCAINE HCL (PF) 2 % IJ SOLN
INTRAMUSCULAR | Status: DC | PRN
  Administered 2023-04-28: 60 mg via INTRADERMAL

## 2023-04-28 MED ORDER — SODIUM CHLORIDE 0.9 % IV SOLN: 250.0000 mL | INTRAVENOUS | Status: DC | PRN

## 2023-04-28 MED ORDER — SODIUM CHLORIDE 0.9 % IV SOLN: 80.0000 mg | INTRAVENOUS | Status: DC

## 2023-04-28 MED ORDER — SODIUM CHLORIDE 0.9 % IV SOLN: INTRAVENOUS | Status: DC

## 2023-04-28 MED ORDER — POVIDONE-IODINE 10 % EX SWAB
2.0000 | Freq: Once | CUTANEOUS | Status: AC
  Administered 2023-04-28: 2 via TOPICAL

## 2023-04-28 MED ORDER — DILTIAZEM HCL ER COATED BEADS 120 MG PO CP24: 120.0000 mg | ORAL_CAPSULE | Freq: Every day | ORAL | 3 refills | Status: DC

## 2023-04-28 MED ORDER — VANCOMYCIN HCL IN DEXTROSE 1-5 GM/200ML-% IV SOLN: 1000.0000 mg | Freq: Once | INTRAVENOUS | Status: DC

## 2023-04-28 MED ORDER — CHLORHEXIDINE GLUCONATE 4 % EX SOLN: 4.0000 | Freq: Once | CUTANEOUS | Status: DC

## 2023-04-28 SURGICAL SUPPLY — 20 items
BAG SNAP BAND KOVER 36X36 (MISCELLANEOUS) IMPLANT
CABLE SURGICAL S-101-97-12 (CABLE) ×1 IMPLANT
CATH 8FR REPROCESSED SOUNDSTAR (CATHETERS) ×1 IMPLANT
CATH 8FR SOUNDSTAR REPROCESSED (CATHETERS) IMPLANT
CATH ABLAT QDOT MICRO BI TC DF (CATHETERS) IMPLANT
CATH DECANAV D CURVE (CATHETERS) IMPLANT
CATH JOSEPH QUAD ALLRED 6F REP (CATHETERS) IMPLANT
CLOSURE MYNX CONTROL 6F/7F (Vascular Products) IMPLANT
PACK EP LF (CUSTOM PROCEDURE TRAY) ×1 IMPLANT
PAD DEFIB RADIO PHYSIO CONN (PAD) ×1 IMPLANT
PATCH CARTO3 (PAD) IMPLANT
SHEATH CARTO VIZIGO SM CVD (SHEATH) IMPLANT
SHEATH PINNACLE 5F 10CM (SHEATH) IMPLANT
SHEATH PINNACLE 7F 10CM (SHEATH) IMPLANT
SHEATH PINNACLE 8F 10CM (SHEATH) IMPLANT
SHEATH PINNACLE VASC 9FR (SHEATH) IMPLANT
SHEATH PROBE COVER 6X72 (BAG) IMPLANT
SHEATH WIRE KIT BAYLIS SL1 (KITS) IMPLANT
TRAY PACEMAKER INSERTION (PACKS) ×1 IMPLANT
TUBING SMART ABLATE COOLFLOW (TUBING) IMPLANT

## 2023-04-28 NOTE — Interval H&P Note (Signed)
 History and Physical Interval Note:  04/28/2023 10:41 AM  Courtney Heys  has presented today for surgery, with the diagnosis of atach.  The various methods of treatment have been discussed with the patient and family. After consideration of risks, benefits and other options for treatment, the patient has consented to  Procedure(s): ATRIAL TACH ABLATION (N/A) AV NODE ABLATION (N/A) PACEMAKER IMPLANT (N/A) as a surgical intervention.  The patient's history has been reviewed, patient examined, no change in status, stable for surgery.  I have reviewed the patient's chart and labs.  Questions were answered to the patient's satisfaction.     Feras Gardella Jorja Loa  CHAIA IKARD has presented today for surgery, with the diagnosis of atrial tachycardia.  The various methods of treatment have been discussed with the patient and family. After consideration of risks, benefits and other options for treatment, the patient has consented to  Procedure(s): Catheter ablation as a surgical intervention .  Risks include but not limited to complete heart block, stroke, esophageal damage, nerve damage, bleeding, vascular damage, tamponade, perforation, MI, and death. The patient's history has been reviewed, patient examined, no change in status, stable for surgery.  I have reviewed the patient's chart and labs.  Questions were answered to the patient's satisfaction.    VINESSA MACCONNELL has presented today for surgery, with the diagnosis of atrial tachycardia.  The various methods of treatment have been discussed with the patient and family. After consideration of risks, benefits and other options for treatment, the patient has consented to  Procedure(s): pacemaker implant with AV node ablation as a surgical intervention .  Risks include but not limited to bleeding, infection, pneumothorax, perforation, tamponade, vascular damage, renal failure, MI, stroke, death, and lead dislodgement . The patient's history has been reviewed,  patient examined, no change in status, stable for surgery.  I have reviewed the patient's chart and labs.  Questions were answered to the patient's satisfaction.    Shanoah Asbill Elberta Fortis, MD 04/28/2023 10:41 AM

## 2023-04-28 NOTE — Discharge Instructions (Signed)

## 2023-04-28 NOTE — Progress Notes (Signed)
 MD reviewed med rec and patient is good to leave once off bedrest.

## 2023-04-28 NOTE — Progress Notes (Signed)
 Patient arrived to cath lab holding area bay 20. Patient is A/O x4. No neuro deficits noted. Follows commands and answers questions appropriately. Bilateral groin dressings are dry/intact with no bleeding or hematoma noted. Post activity and precautions explained. Patient verbalized understanding. Placed on room air with O2 sat 92% ETCO@ 35. Denies any distress at this time. VSS. Will continue to monitor per orders and protocol.Mary Farrell

## 2023-04-28 NOTE — Transfer of Care (Signed)
 Immediate Anesthesia Transfer of Care Note  Patient: Mary Farrell  Procedure(s) Performed: ATRIAL TACH ABLATION AV NODE ABLATION PACEMAKER IMPLANT  Patient Location: PACU and Cath Lab  Anesthesia Type:MAC  Level of Consciousness: awake, alert , oriented, and patient cooperative  Airway & Oxygen Therapy: Patient Spontanous Breathing and Patient connected to face mask oxygen  Post-op Assessment: Report given to RN, Post -op Vital signs reviewed and stable, Patient moving all extremities, Patient moving all extremities X 4, and Patient able to stick tongue midline  Post vital signs: Reviewed and stable  Last Vitals:  Vitals Value Taken Time  BP 112/36 04/28/23 1349  Temp    Pulse 46 04/28/23 1351  Resp 17 04/28/23 1351  SpO2 99 % 04/28/23 1351  Vitals shown include unfiled device data.  Last Pain:  Vitals:   04/28/23 1031  TempSrc: Oral  PainSc:          Complications: There were no known notable events for this encounter.

## 2023-04-28 NOTE — Anesthesia Postprocedure Evaluation (Signed)
 Anesthesia Post Note  Patient: Mary Farrell  Procedure(s) Performed: ATRIAL TACH ABLATION AV NODE ABLATION PACEMAKER IMPLANT     Patient location during evaluation: Cath Lab Anesthesia Type: General Level of consciousness: awake and alert Pain management: pain level controlled Vital Signs Assessment: post-procedure vital signs reviewed and stable Respiratory status: spontaneous breathing, nonlabored ventilation and respiratory function stable Cardiovascular status: stable and blood pressure returned to baseline Postop Assessment: no apparent nausea or vomiting Anesthetic complications: no  There were no known notable events for this encounter.  Last Vitals:  Vitals:   04/28/23 1500 04/28/23 1515  BP: (!) 115/43 (!) 117/47  Pulse: (!) 48 (!) 52  Resp: 18 14  Temp:    SpO2: 92% 93%    Last Pain:  Vitals:   04/28/23 1455  TempSrc:   PainSc: 0-No pain                 Britley Gashi,W. EDMOND

## 2023-04-28 NOTE — Anesthesia Preprocedure Evaluation (Addendum)
 Anesthesia Evaluation  Patient identified by MRN, date of birth, ID band Patient awake    Reviewed: Allergy & Precautions, H&P , NPO status , Patient's Chart, lab work & pertinent test results, reviewed documented beta blocker date and time   Airway Mallampati: III  TM Distance: >3 FB Neck ROM: Full    Dental no notable dental hx. (+) Partial Upper, Dental Advisory Given   Pulmonary shortness of breath, sleep apnea    Pulmonary exam normal breath sounds clear to auscultation       Cardiovascular hypertension, Pt. on medications and Pt. on home beta blockers + dysrhythmias + Valvular Problems/Murmurs AI  Rhythm:Irregular Rate:Normal     Neuro/Psych  Headaches  Anxiety        GI/Hepatic Neg liver ROS, hiatal hernia,GERD  Medicated,,  Endo/Other  negative endocrine ROS    Renal/GU negative Renal ROS  negative genitourinary   Musculoskeletal  (+) Arthritis , Osteoarthritis,    Abdominal   Peds  Hematology negative hematology ROS (+)   Anesthesia Other Findings   Reproductive/Obstetrics negative OB ROS                             Anesthesia Physical Anesthesia Plan  ASA: 3  Anesthesia Plan: MAC   Post-op Pain Management: Tylenol PO (pre-op)*   Induction: Intravenous  PONV Risk Score and Plan: 4 or greater and Ondansetron, Dexamethasone and Treatment may vary due to age or medical condition  Airway Management Planned: Natural Airway and Simple Face Mask  Additional Equipment:   Intra-op Plan:   Post-operative Plan:   Informed Consent: I have reviewed the patients History and Physical, chart, labs and discussed the procedure including the risks, benefits and alternatives for the proposed anesthesia with the patient or authorized representative who has indicated his/her understanding and acceptance.     Dental advisory given  Plan Discussed with: CRNA  Anesthesia Plan Comments:         Anesthesia Quick Evaluation

## 2023-04-29 ENCOUNTER — Encounter (HOSPITAL_COMMUNITY): Payer: Self-pay | Admitting: Cardiology

## 2023-04-29 ENCOUNTER — Telehealth (HOSPITAL_COMMUNITY): Payer: Self-pay

## 2023-04-29 LAB — POCT ACTIVATED CLOTTING TIME: Activated Clotting Time: 354 s

## 2023-04-29 NOTE — Telephone Encounter (Signed)
 Spoke with patient to complete post procedure follow up call.  Patient reports no complications with groin sites.   Instructions reviewed with patient:  Remove large bandage at puncture site after 24 hours. It is normal to have bruising, tenderness and a pea or marble sized lump/knot at the groin site which can take up to three months to resolve.  Get help right away if you notice sudden swelling at the puncture site.  Check your puncture site every day for signs of infection: fever, redness, swelling, pus drainage, warmth, foul odor or excessive pain. If this occurs, please call the office at [phone], to speak with the nurse. Get help right away if your puncture site is bleeding and the bleeding does not stop after applying firm pressure to the area.  You may continue to have skipped beats during the first several months after your procedure.  It is very important not to miss any doses of your blood thinner Eliquis. Patient restarted taking this medication on yesterday   You will follow up with the [facility] on 05/26/23 after your procedure.   Patient verbalized understanding to all instructions provided.

## 2023-05-06 ENCOUNTER — Ambulatory Visit (HOSPITAL_BASED_OUTPATIENT_CLINIC_OR_DEPARTMENT_OTHER): Admitting: Family

## 2023-05-06 VITALS — BP 132/58 | HR 52 | Ht 60.0 in | Wt 161.4 lb

## 2023-05-06 DIAGNOSIS — I4819 Other persistent atrial fibrillation: Secondary | ICD-10-CM | POA: Diagnosis not present

## 2023-05-06 DIAGNOSIS — I10 Essential (primary) hypertension: Secondary | ICD-10-CM | POA: Diagnosis not present

## 2023-05-06 DIAGNOSIS — D6859 Other primary thrombophilia: Secondary | ICD-10-CM

## 2023-05-06 DIAGNOSIS — Z9889 Other specified postprocedural states: Secondary | ICD-10-CM | POA: Diagnosis not present

## 2023-05-06 DIAGNOSIS — R5381 Other malaise: Secondary | ICD-10-CM

## 2023-05-06 NOTE — Progress Notes (Signed)
 Cardiology Office Note:  .   Date:  05/07/2023  ID:  Mary Farrell, DOB 20-Jul-1945, MRN [id number] PCP: Mary Floro, MD  [facility] Providers Cardiologist:  Mary Si, MD Electrophysiologist:  Mary Jorja Loa, MD    History of Present Illness: Mary Farrell is a 78 y.o. female with a hx of moderate aortic regurgitation and mitral stenosis s/p surgical MVR, atrial clip, MAZE 11/2018, atrial fibrillation/flutter s/p atrial fibrillation ablation 03/10/2017 with repeat ablation 06/03/2017 with PVI ablation 02/23/22, hypertension .     Follows with Mary Farrell of EP.  She has been on multiple antiarrhythmics. Failed Flecainide in 2017 due to PR prolongation. Intolerant of Amiodarone with nausea. Tikosyn admission 01/2019.  Seen 09/25/2021 by Mary Farrell with atrial flutter and subsequent cardioversion 10/14/2021. Underwent atrial fibrillation ablation 02/23/2022.   At visit 06/2022 Jardiance added for HFpEF benefit but had to be stopped for yeast infection.   Admitted 3/27-3/31/25  for SVT which was thought to be an atrial tachycardia. Coreg transitioned ot Metoprolol. Cardizem increased to 250mg  daily. Dofetilide continued. Echo 04/25/23 LVEF 60-65%, no RWMA, RV normal, severe LAE, moderate RAE, normally functioning MV prosthesis, mild to moderate TR, mild AI.  On 04/28/23 underwent atrial tach ablation, AV node ablation.  Discussed the use of AI scribe software for clinical note transcription with the patient, who gave verbal consent to proceed.  History of Present Illness Presents with her husband and notes concerns about recent weight gain and shortness of breath. She reports feeling lightheaded most of the time, which she attributes to her heart condition and possibly benign paroxysmal positional vertigo (BPPV).   The patient also reports difficulty with physical activity due to feeling out of shape and experiencing pain in her hip joint. Previous injections in right hip.  She expresses frustration with her inability to keep up with household chores and a desire to increase her stamina.  The patient also mentions a history of yeast infections, which led to discontinuation of Jardiance. No recurrent yeast infection since that time. She expresses disappointment at not being able to tolerate the medication. She reports being diligent about monitoring her weight. Her dry weight at home is 160 lbs and edid take two of her Lasix for total dose of 40mg  on Wednesday due to increased weight and dyspnea.   Average BP at home since 04/29/23 of 124/60.   ROS: Please see the history of present illness.    All other systems reviewed and are negative.     Studies Reviewed: .        Cardiac Studies & Procedures   ______________________________________________________________________________________________ CARDIAC CATHETERIZATION  CARDIAC CATHETERIZATION 10/18/2018  Narrative Images from the original result were not included.   Hemodynamic findings consistent with mild pulmonary hypertension.  LV end diastolic pressure is normal.  There is severe mitral valve stenosis -suggested by a echocardiogram, large PCWP V waves along with elevated PCWP but normal LVEDP would also suggest this.  Angiographically normal coronary arteries  SUMMARY  Angiographically normal coronary arteries  Mild pulmonary hypertension with mildly elevated PCWP but normal LVEDP suggestive of mitral stenosis  Significant V wave noted on PCWP waveform suggesting mitral stenosis.  RECOMMENDATIONS  Return to nursing unit for ongoing care.  Proceed with plans for mitral valve replacement    Mary Farrell, M.D., M.S. Interventional Cardiologist  Pager # [phone] Phone # [phone] [address] [place], [place] [address]  Findings Coronary Findings Diagnostic  Dominance: Right  Left  Main Vessel was injected. Vessel is normal in caliber and large. Vessel is  angiographically normal.  Left Anterior Descending Vessel was injected. Vessel is normal in caliber and large. Vessel is angiographically normal.  First Diagonal Branch Vessel is small in size.  Second Diagonal Branch Vessel is small in size.  Third Diagonal Branch Vessel is small in size.  Left Circumflex Vessel was injected. Vessel is normal in caliber and large. Vessel is angiographically normal.  Left Posterior Atrioventricular Artery Vessel is small in size.  Right Coronary Artery Vessel was injected. Vessel is normal in caliber and large. Vessel is angiographically normal.  Right Ventricular Branch Vessel is small in size.  First Right Posterolateral Branch Vessel is small in size.  Second Right Posterolateral Branch Vessel is small in size.  Intervention  No interventions have been documented.   STRESS TESTS  MYOCARDIAL PERFUSION IMAGING 08/26/2015  Narrative  The left ventricular ejection fraction is normal (55-65%).  Nuclear stress EF: 60%.  Blood pressure demonstrated a hypertensive response to exercise.  Upsloping ST segment depression ST segment depression of 1 mm was noted during stress in the II, III, V6, V5 and aVF leads.  This is a low risk study.  No reversible ischemia. LVEF 60% with normal wall motion. Fair exercise tolerance. No chest pain. This is a low risk study.   ECHOCARDIOGRAM  ECHOCARDIOGRAM COMPLETE 04/25/2023  Narrative ECHOCARDIOGRAM REPORT    Patient Name:   Mary Farrell Date of Exam: 04/25/2023 Medical Rec #:  [id number]    Height:       60.0 in Accession #:    [id number]   Weight:       161.4 lb Date of Birth:  04-Feb-1945   BSA:          1.704 m Patient Age:    77 years     BP:           135/87 mmHg Patient Gender: F            HR:           87 bpm. Exam Location:  Inpatient  Procedure: 2D Echo, Cardiac Doppler and Color Doppler (Both Spectral and Color Flow Doppler were utilized during procedure).  Indications:     Abnormal ECG R94.31  History:        Patient has prior history of Echocardiogram examinations, most recent 12/07/2021. CHF, Abnormal ECG, Mitral Valve Disease, Signs/Symptoms:Murmur and Dyspnea; Risk Factors:Hypertension and Sleep Apnea.  Mitral Valve: 31 mm Edwards bioprosthetic valve valve is present in the mitral position.  Sonographer:    Astrid Blamer Referring Phys: [id number] JOSHUA PARKER  IMPRESSIONS   1. Left ventricular ejection fraction, by estimation, is 60 to 65%. The left ventricle has normal function. The left ventricle has no regional wall motion abnormalities. Left ventricular diastolic function could not be evaluated. 2. Right ventricular systolic function is normal. The right ventricular size is normal. There is normal pulmonary artery systolic pressure. The estimated right ventricular systolic pressure is 29.2 mmHg. 3. Left atrial size was severely dilated. 4. Right atrial size was moderately dilated. 5. The mitral valve has been repaired/replaced. No evidence of mitral valve regurgitation. No evidence of mitral stenosis. The mean mitral valve gradient is 5.0 mmHg with average heart rate of 92 bpm. There is a 31 mm Edwards bioprosthetic valve present in the mitral position. Echo findings are consistent with normal structure and function of the mitral valve prosthesis. 6. Tricuspid valve regurgitation is mild to moderate.  7. The aortic valve is tricuspid. Aortic valve regurgitation is mild. No aortic stenosis is present. 8. The inferior vena cava is dilated in size with >50% respiratory variability, suggesting right atrial pressure of 8 mmHg.  Comparison(s): No significant change from prior study. Prior images reviewed side by side. Mitral valve prosthesis gradients are higher mostly due to faster heart rate, although pressure half time has also lengthened slightly, suggesting there may also be a mild reduction in the effective orifice area.  FINDINGS Left Ventricle:  Left ventricular ejection fraction, by estimation, is 60 to 65%. The left ventricle has normal function. The left ventricle has no regional wall motion abnormalities. The left ventricular internal cavity size was normal in size. There is no left ventricular hypertrophy. Abnormal (paradoxical) septal motion consistent with post-operative status. Left ventricular diastolic function could not be evaluated due to mitral valve replacement. Left ventricular diastolic function could not be evaluated.  Right Ventricle: The right ventricular size is normal. No increase in right ventricular wall thickness. Right ventricular systolic function is normal. There is normal pulmonary artery systolic pressure. The tricuspid regurgitant velocity is 2.30 m/s, and with an assumed right atrial pressure of 8 mmHg, the estimated right ventricular systolic pressure is 29.2 mmHg.  Left Atrium: Left atrial size was severely dilated.  Right Atrium: Right atrial size was moderately dilated.  Pericardium: There is no evidence of pericardial effusion.  Mitral Valve: The mitral valve has been repaired/replaced. No evidence of mitral valve regurgitation. There is a 31 mm Edwards bioprosthetic valve present in the mitral position. Echo findings are consistent with normal structure and function of the mitral valve prosthesis. No evidence of mitral valve stenosis. MV peak gradient, 8.2 mmHg. The mean mitral valve gradient is 5.0 mmHg with average heart rate of 92 bpm.  Tricuspid Valve: The tricuspid valve is normal in structure. Tricuspid valve regurgitation is mild to moderate.  Aortic Valve: The aortic valve is tricuspid. Aortic valve regurgitation is mild. No aortic stenosis is present. Aortic valve mean gradient measures 5.0 mmHg. Aortic valve peak gradient measures 7.7 mmHg. Aortic valve area, by VTI measures 3.43 cm.  Pulmonic Valve: The pulmonic valve was grossly normal. Pulmonic valve regurgitation is not visualized. No  evidence of pulmonic stenosis.  Aorta: The aortic root and ascending aorta are structurally normal, with no evidence of dilitation.  Venous: The inferior vena cava is dilated in size with greater than 50% respiratory variability, suggesting right atrial pressure of 8 mmHg.  IAS/Shunts: No atrial level shunt detected by color flow Doppler.   LEFT VENTRICLE PLAX 2D LVIDd:         4.70 cm LVIDs:         2.90 cm LV PW:         1.00 cm LV IVS:        1.10 cm LVOT diam:     1.80 cm LV SV:         89 LV SV Index:   52 LVOT Area:     2.54 cm   RIGHT VENTRICLE RV S prime:     6.96 cm/s TAPSE (M-mode): 1.5 cm  LEFT ATRIUM             Index        RIGHT ATRIUM           Index LA Vol (A2C):   74.0 ml 43.43 ml/m  RA Area:     18.10 cm LA Vol (A4C):   77.3 ml 45.36 ml/m  RA Volume:   38.50 ml  22.59 ml/m LA Biplane Vol: 76.6 ml 44.95 ml/m AORTIC VALVE AV Area (Vmax):    2.71 cm AV Area (Vmean):   2.38 cm AV Area (VTI):     3.43 cm AV Vmax:           139.00 cm/s AV Vmean:          111.000 cm/s AV VTI:            0.260 m AV Peak Grad:      7.7 mmHg AV Mean Grad:      5.0 mmHg LVOT Vmax:         148.00 cm/s LVOT Vmean:        104.000 cm/s LVOT VTI:          0.350 m LVOT/AV VTI ratio: 1.35  AORTA Ao Root diam: 2.90 cm  MITRAL VALVE                TRICUSPID VALVE MV Area (PHT): 2.32 cm     TR Peak grad:   21.2 mmHg MV Area VTI:   3.88 cm     TR Vmax:        230.00 cm/s MV Peak grad:  8.2 mmHg MV Mean grad:  5.0 mmHg     SHUNTS MV Vmax:       1.44 m/s     Systemic VTI:  0.35 m MV Vmean:      107.0 cm/s   Systemic Diam: 1.80 cm MV Decel Time: 327 msec MV E velocity: 122.00 cm/s  Karyl Paget Croitoru MD Electronically signed by Luana Rumple MD Signature Date/Time: 04/25/2023/2:14:32 PM    Final   TEE  ECHO TEE 10/17/2018  Narrative TRANSESOPHOGEAL ECHO REPORT    Patient Name:   Mary Farrell Date of Exam: 10/17/2018 Medical Rec #:  [id number]    Height:        60.0 in Accession #:    [id number]   Weight:       152.7 lb Date of Birth:  10/24/1945   BSA:          1.66 m Patient Age:    72 years     BP:           182/72 mmHg Patient Gender: F            HR:           69 bpm. 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 CHF; Arrythmias:Atrial Fibrillation Risk Factors:Hypertension and Sleep Apnea. Rheumatic Mitral Stenosis.  Sonographer:     Sherline Distel Senior RDCS Referring Phys:  [id number] [facility] Diagnosing Phys: Dorothye Gathers MD    PROCEDURE: Consent was requested emergently by emergency room physicain. Patients was under conscious sedation during this procedure. Anesthetic was administered intravenously by performing Physician: 75mcg of Fentanyl, 5.0mg  of Versed. The transesophogeal probe was passed through the esophogus of the patient. Image quality was excellent. The patient's vital signs; including heart rate, blood pressure, and oxygen saturation; remained stable throughout the procedure. The patient developed no complications during the procedure.  IMPRESSIONS   1. Left ventricular ejection fraction, by visual estimation, is 55 to 60%. The left ventricle has normal function. Normal left ventricular size. There is no left ventricular  hypertrophy. 2. Global right ventricle has normal systolic function.The right ventricular size is normal. No increase in right ventricular wall thickness. 3. Pulmonary hypertension. 4. Left atrial size was severely dilated. 5. No LAA thrombus. 6. Right atrial size was normal. 7. The mitral valve is rheumatic. Mild mitral valve regurgitation. Moderate-severe mitral stenosis. moderate mitral valve stenosis. 8. Moderate stenosis by pressure half time 1.2cm squared and mean gradient ( ). PISA  radius 1.2cm, with calculated area of 1.2 cm squared. Valve leaflets are thin, pliable, no subvalvular thickening, however surrounding P1 (posterior leaflet) there is a calcified fixed nodule (MAC like appearance). 9. The tricuspid valve is normal in structure. Tricuspid valve regurgitation is mild. 10. The aortic valve is tricuspid Aortic valve regurgitation is mild by color flow Doppler. Mild aortic valve sclerosis without stenosis. 11. The pulmonic valve was normal in structure. Pulmonic valve regurgitation is not visualized by color flow Doppler. 12. Moderately elevated pulmonary artery systolic pressure. 13. The inferior vena cava is normal in size with greater than 50% respiratory variability, suggesting right atrial pressure of 3 mmHg. 14. Consider stress echo with transmitral gradient measurements. 15. Her moderate to severely elevated pulmonary pressures may indicate along with AFIB and markedly dilated LA along with symptoms, that mitral valve replacement is indicated. 16. However pulmonary pressures are high (60-43mmHg). Although leaflets are thin, pliable and without subvalvular thickening, she does have a calcified density on P1 which would likely increase risk with balloon valvuloplasty. 17. Rheumatic moderate mitral stenosis.  FINDINGS Left Ventricle: Left ventricular ejection fraction, by visual estimation, is 55 to 60%. The left ventricle has normal function. There is no left ventricular hypertrophy. Normal left ventricular size.  Right Ventricle: The right ventricular size is normal. No increase in right ventricular wall thickness. Global RV systolic function is has normal systolic function. The tricuspid regurgitant velocity is 3.86 m/s, and with an assumed right atrial pressure of 3 mmHg, the estimated right ventricular systolic pressure is moderately elevated at 62.6 mmHg. Findings are consistent with pulmonary hypertension.  Left Atrium: Left atrial size was severely dilated.  No LAA thrombus.  Right Atrium: Right atrial size was normal in size  Pericardium: There is no evidence of pericardial effusion.  Mitral Valve: The mitral valve is rheumatic. Moderate-severe mitral valve stenosis by observation. Moderate mitral valve stenosis. Mitral valve area PHT measures 1.18 cm. MV peak gradient, 14.7 mmHg. Mild mitral valve regurgitation. Moderate stenosis by pressure half time 1.2cm squared and mean gradient ( ). PISA radius 1.2cm, with calculated area of 1.2 cm squared. Valve leaflets are thin, pliable, no subvalvular thickening, however surrounding P1 (posterior leaflet) there is a calcified fixed nodule (MAC like appearance).  Tricuspid Valve: The tricuspid valve is normal in structure. Tricuspid valve regurgitation is mild by color flow Doppler.  Aortic Valve: The aortic valve is tricuspid. Aortic valve regurgitation is mild by color flow Doppler. Mild aortic valve sclerosis is present, with no evidence of aortic valve stenosis.  Pulmonic Valve: The pulmonic valve was normal in structure. Pulmonic valve regurgitation is not visualized by color flow Doppler.  Aorta: The aortic root, ascending aorta and aortic arch are all structurally normal, with no evidence of dilitation or obstruction.  Venous: The inferior vena cava is normal in size with greater than 50% respiratory variability, suggesting right atrial pressure of 3 mmHg.  Shunts: No ventricular septal defect is seen or detected. There is no evidence of an atrial septal defect. No atrial level shunt detected by color flow Doppler.  Additional  Comments: Rheumatic moderate mitral stenosis. However pulmonary pressures are high (60-43mmHg). Although leaflets are thin, pliable and without subvalvular thickening, she does have a calcified density on P1 which would likely increase risk with balloon valvuloplasty. Consider stress echo with transmitral gradient measurements. Her moderate to severely elevated  pulmonary pressures may indicate along with AFIB and markedly dilated LA along with symptoms, that mitral valve replacement is indicated.  MITRAL VALVE               Normals TRICUSPID VALVE             Normals MV Area (PHT): 1.18 cm            TR Peak grad:   59.6 mmHg MV Peak grad:  14.7 mmHg   4 mmHg  TR Vmax:        388.00 cm/s 288 cm/s MV Mean grad:  8.0 mmHg MV Vmax:       1.92 m/s MV Vmean:      135.0 cm/s MV VTI:        0.61 m MV PHT:        186.00 msec 55 ms MR Peak grad:    14.9 mmHg MR Mean grad:    9.0 mmHg MR Vmax:         193.00 cm/s MR Vmean:        144.0 cm/s MR PISA:         9.05 cm MR PISA Eff ROA: 120 mm MR PISA Radius:  1.20 cm   Dorothye Gathers MD Electronically signed by Dorothye Gathers MD Signature Date/Time: 10/17/2018/2:13:43 PM    Final  MONITORS  LONG TERM MONITOR (3-14 DAYS) 09/17/2022  Narrative Patch Wear Time:  13 days and 21 hours  Patient had a min HR of 37 bpm, max HR of 131 bpm, and avg HR of 59 bpm. Predominant underlying rhythm was Sinus Rhythm. Atrial Flutter/SVT occurred (13% burden), ranging from 39-131 bpm (avg of 81 bpm), the longest 1 day 20 hours with an avg rate of 81 bpm. Triggered episodes associated with atrial flutter/SVT Less than 1% ventricular and supraventricular ectopy  Mary Camnitz, MD       ______________________________________________________________________________________________      Risk Assessment/Calculations:     CHA2DS2-VASc Score = 5   This indicates a 7.2% annual risk of stroke. The patient's score is based upon: CHF History: 1 HTN History: 1 Diabetes History: 0 Stroke History: 0 Vascular Disease History: 0 Age Score: 2 Gender Score: 1             Physical Exam:   VS:  BP (!) 132/58   Pulse (!) 52   Ht 5' (1.524 m)   Wt 161 lb 6.4 oz (73.2 kg)   SpO2 95%   BMI 31.52 kg/m    Wt Readings from Last 3 Encounters:  05/06/23 161 lb 6.4 oz (73.2 kg)  04/28/23 161 lb 6.4 oz (73.2 kg)   04/22/23 161 lb 6.4 oz (73.2 kg)    GEN: Well nourished, well developed in no acute distress NECK: No JVD; No carotid bruits CARDIAC: RRR, no murmurs, rubs, gallops RESPIRATORY:  Clear to auscultation without rales, wheezing or rhonchi  ABDOMEN: Soft, non-tender, non-distended EXTREMITIES:  No edema; No deformity. Bilateral groin puncture site with no ecchymosis nor hematoma.   ASSESSMENT AND PLAN: .    Persistent atiral fibrillation s/p ablation / High risk medication us  / Hypercoagulable state / Atrial tach s/p ablation 04/28/23 - Bilateral groin site no  ecchymosis nor hematoma after recent atach ablation.  Maintaining NSR by auscultation. Continue Tikosyn 500mcg daily, Eliquis 5mg  BID, Toprol 100mg  daily. Does not meet dose reduction criteria for Eliquis.  CHA2DS2-VASc Score = 5 [CHF History: 1, HTN History: 1, Diabetes History: 0, Stroke History: 0, Vascular Disease History: 0, Age Score: 2, Gender Score: 1].  Therefore, the patient's annual risk of stroke is 7.2 %.     Tikosyn monitoring: 04/25/23 K 3.7, mag 2.0  Physical deconditioning - in setting of recent hospitalization. Encouraged seated exercises ,handout provided. Discussed possible participation in outpatient physical therapy and she Mary contact us  if interested in referral.   HTN - BP mildly elevated in clinic today 132/58.  Continue pesent regimen Diltiazem 120mg  daily, Toprol 100mg  daily, Hydralazine 25mg   BID, Valsartan 160mg  daily. Previously felt poorly with overcorrection of BP. Mary plan to gradually increase physical activity and continue home monitoring of BP.    MS s/p MVR - Continue SBE prophylaxis. Echo 03/2023 normal function of MV prosthesis.    Bilateral carotid stenosis - 09/2018 bilateral 1-39% stenosis.12/08/21 bilateral 1-39% stenosis. Duplex 11/2022 with no significant carotid stenosis and elevated velocities due to tortuosity recommended for repeat duplex in 2 years. Continue Atorvastatin 10mg  daily.         Dispo: follow up with EP as scheduled 05/26/23 and with Dr. Theodis Fiscal or Clearnce Curia, NP in 3-4 months  Signed, Clearnce Curia, NP

## 2023-05-06 NOTE — Patient Instructions (Addendum)
 Medication Instructions:  Your physician has recommended you make the following change in your medication:  TAKE Lasix 20 mg for two days then take only as needed if weight is more than 160 pounds.  *If you need a refill on your cardiac medications before your next appointment, please call your pharmacy*  Follow-Up: At [facility], you and your health needs are our priority.  As part of our continuing mission to provide you with exceptional heart care, our providers are all part of one team.  This team includes your primary Cardiologist (physician) and Advanced Practice Providers or APPs (Physician Assistants and Nurse Practitioners) who all work together to provide you with the care you need, when you need it.  Your next appointment:   Follow up with Dr. Theodis Fiscal or Neomi Banks, NP in 3-4 months. Follow up with Mertha Abrahams as scheduled  We recommend signing up for the patient portal called "MyChart".  Sign up information is provided on this After Visit Summary.  MyChart is used to connect with patients for Virtual Visits (Telemedicine).  Patients are able to view lab/test results, encounter notes, upcoming appointments, etc.  Non-urgent messages can be sent to your provider as well.   To learn more about what you can do with MyChart, go to [email or web address].   Other Instructions Exercises to do While Sitting Warm-up Before starting other exercises: Sit up as straight as you can. Have your knees bent at 90 degrees, which is the shape of the capital letter "L." Keep your feet flat on the floor. Sit at the front edge of your chair, if you can. Pull in (tighten) the muscles in your abdomen and stretch your spine and neck as straight as you can. Hold this position for a few minutes. Breathe in and out evenly. Try to concentrate on your breathing, and relax your mind.  Stretching Exercise A: Arm stretch Hold your arms out straight in front of your body. Bend your hands at  the wrist with your fingers pointing up, as if signaling someone to stop. Notice the slight tension in your forearms as you hold the position. Keeping your arms out and your hands bent, rotate your hands outward as far as you can and hold this stretch. Aim to have your thumbs pointing up and your pinkie fingers pointing down. Slowly repeat arm stretches for one minute as tolerated. Exercise B: Leg stretch If you can move your legs, try to "draw" letters on the floor with the toes of your foot. Write your name with one foot. Write your name with the toes of your other foot. Slowly repeat the movements for one minute as tolerated. Exercise C: Reach for the sky Reach your hands as far over your head as you can to stretch your spine. Move your hands and arms as if you are climbing a rope. Slowly repeat the movements for one minute as tolerated.  Range of motion exercises Exercise A: Shoulder roll Let your arms hang loosely at your sides. Lift just your shoulders up toward your ears, then let them relax back down. When your shoulders feel loose, rotate your shoulders in backward and forward circles. Do shoulder rolls slowly for one minute as tolerated. Exercise B: March in place As if you are marching, pump your arms and lift your legs up and down. Lift your knees as high as you can. If you are unable to lift your knees, just pump your arms and move your ankles and feet up and down. March  in place for one minute as tolerated. Exercise C: Seated jumping jacks Let your arms hang down straight. Keeping your arms straight, lift them up over your head. Aim to point your fingers to the ceiling. While you lift your arms, straighten your legs and slide your heels along the floor to your sides, as wide as you can. As you bring your arms back down to your sides, slide your legs back together. If you are unable to use your legs, just move your arms. Slowly repeat seated jumping jacks for one minute as  tolerated.  Strengthening exercises Exercise A: Shoulder squeeze Hold your arms straight out from your body to your sides, with your elbows bent and your fists pointed at the ceiling. Keeping your arms in the bent position, move them forward so your elbows and forearms meet in front of your face. Open your arms back out as wide as you can with your elbows still bent, until you feel your shoulder blades squeezing together. Hold for 5 seconds. Slowly repeat the movements forward and backward for one minute as tolerated.

## 2023-05-07 ENCOUNTER — Encounter (HOSPITAL_BASED_OUTPATIENT_CLINIC_OR_DEPARTMENT_OTHER): Payer: Self-pay | Admitting: Family

## 2023-05-25 NOTE — Progress Notes (Signed)
 Cardiology Office Note Date:  05/25/2023  Patient ID:  Mary, Farrell January 11, 1946, MRN [id number] PCP:  Jimmey Mould, MD  Cardiologist:  Dr. Theodis Fiscal Electrophysiologist: Dr. Lawana Pray    Chief Complaint:      post ablation  History of Present Illness: Mary Farrell is a 78 y.o. 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 Lawana Pray 09/09/22, unfortunately recurrent palpitations, mentions Aflutter, ER visit with DCCV Discussed options Repeat ablation Changing to amiodarone  She was going to give these options some thought  I saw her 10/19/22 She reports since starting Diltiazem  (she reports starting after weaaring the monitor/being in the ER) her AFib/flutter seems to have simmered down She would really like to avoid another procedure as well as amiodarone  When not in AF she feels well No CP, SOB No near syncope or syncope Reports good medication compliance No bleeding or signs of bleeding Stable EKG, improved burden Pt hoping to avoid amiodarone /ablation   ER visit 12/05/22, reported hit on the head by a TV antenna followed by nausea > CT negative, discharged from the ER, pt suspected nausea  2/2 cake she had eaten   I saw her 12/20/22 She had her hip injected las week Wed., that evening/overnight she woke about 4AM with a discomfort/pressure in her epigastrium/central chest.  She got up and checked her BP was OK, HR was OK Did not feel like Afib, suspected her GERD (given no arm, jaw pain or other symptoms), unfortunately no tums available. Eventually just settled away and has not happened again Though for a few days after the injection her  HR unusually in the 70's (her usual in the 50's, and seems as it settled back to her baseline, she feels tired, no energy, sluggish No symptoms of her AFib since our last visit Last week she did have a slight but steady weight gain maybe a little breathless or "wheeze" and took her PRN lasix  with quick return to her baseline weight and resolution of her wheeze. Timing unclear but thinks noted pre-injection (steroid). No near syncope or syncope No bleeding or signs of bleeding ? If symptoms 2/2 bradycardia Might consider stopping her coreg ? PLAN: Discussed her symptoms a length today EKG without acute changes No ongoing symptoms Planned to f/u w/Dr. Theodis Fiscal >> w/u as she felt indicated  Saw Dr. Theodis Fiscal 12/31/22. Jardiance  added for her HFpEF, echo in a year   I saw her 03/22/23 Had UTI > yeast infection > Jardiance  stopped and advised cards follow up Had been given and taken phenergan  Tikosyn  teaching revisit, continued  04/22/23 admitted with an SVT, initially planned to discharge from the hospital but became recurrent and admitted EP saw her felt to be an atrial tachycardia Coreg  >> metoprolol  Dilt increased Suspected she may need pacer/AV node ablation  Brought back 04/28/23 > found a left sided ATach > ablated (no pacer)  She saw cards team 05/06/23, frustrated with hip pain, reduced stamina, out of shape.doing well otherwise, no changes were made  TODAY is scheduled as a post ablation visit  She had a slight and brief sense of palpitation this morning, none otherwise Though  she is tired, and winded No CP No near syncope or syncope but a sense of feeling lightheaded a lot  Her husband has some dementia (he denies it) and this seems to be advancing, generally does ok but of late moments of aggression that are really anxiety provoking She worries a lot about him, his (their) future.  AFib/AAD Amiodarone  not tolerated 2/2 GI  Flecainide  2017 >> failed and had PR  prolongation PVI ablation 03/10/2017 CTI/PVI Ablation May 2019 MAZE 2020 Tikosyn  Jan 2021 PVI ablation 02/23/22 04/28/23: EPS/ablation: Ablation of atrial tachycardia around the left atrial appendage   Past Medical History:  Diagnosis Date   Anxiety 06/29/2012   Aortic regurgitation 08/13/2015   Arthritis    "hands, wrists, back, feet, toes" (06/24/2017)   Atrial flutter (HCC) 09/18/2015   Atypical chest pain 05/13/2016   Chest pain    remote cath in 1996 with NORMAL coronaries noted   CHF (congestive heart failure) (HCC)    Dyspnea 10/30/2010   Essential hypertension 09/05/2013   Exogenous obesity    history of    GERD (gastroesophageal reflux disease)    Glaucoma, both eyes    Headache, migraine    "stopped in my 50's" (09/18/2015)   History of cardiovascular stress test 2007   showing no ischemia   Hypertension    Mitral stenosis and aortic insufficiency 06/23/2010   Mitral stenosis with insufficiency    Mitral stenosis with regurgitation    Murmur    of mitral stenosis and moderate aortic insufficiency       Nausea 05/01/2018   Obesity (BMI 30.0-34.9) 02/13/2021   OSA on CPAP    Palpitations    occasional   Paroxysmal A-fib (HCC) 06/24/2017   Pericardial effusion 09/18/2015   Persistent atrial fibrillation (HCC) 03/10/2017   Personal history of rheumatic heart disease    S/P minimally-invasive maze operation for atrial fibrillation 12/13/2018   Complete bilateral atrial lesion set using cryothermy and bipolar radiofrequency ablation with clipping of LA appendage via right mini-thoracotomy approach   S/P minimally-invasive mitral valve replacement with bioprosthetic valve 12/13/2018   31 mm [facility] Mitral stented bovine pericardial tissue valve   SBE (subacute bacterial endocarditis)    prophylaxis, patient unaware   Sliding hiatal hernia    Stress 02/13/2021   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   Vertigo 05/01/2018    Past Surgical History:  Procedure  Laterality Date   ABDOMINAL HYSTERECTOMY  1975   APPENDECTOMY  1977   ATRIAL FIBRILLATION ABLATION N/A 03/10/2017   Procedure: ATRIAL FIBRILLATION ABLATION;  Surgeon: Lei Pump, MD;  Location: [facility];  Service: Cardiovascular;  Laterality: N/A;   ATRIAL FIBRILLATION ABLATION  06/24/2017   ATRIAL FIBRILLATION ABLATION N/A 06/24/2017   Procedure: ATRIAL FIBRILLATION ABLATION;  Surgeon: Lei Pump, MD;  Location: [facility];  Service: Cardiovascular;  Laterality: N/A;   ATRIAL FIBRILLATION ABLATION N/A 02/23/2022   Procedure: ATRIAL FIBRILLATION ABLATION;  Surgeon: Lei Pump, MD;  Location: [facility];  Service: Cardiovascular;  Laterality: N/A;   ATRIAL TACH ABLATION N/A 04/28/2023   Procedure: ATRIAL TACH ABLATION;  Surgeon: Lei Pump, MD;  Location: [facility];  Service: Cardiovascular;  Laterality: N/A;   AV NODE ABLATION N/A 04/28/2023   Procedure: AV NODE ABLATION;  Surgeon: Lei Pump, MD;  Location: [facility];  Service: Cardiovascular;  Laterality: N/A;   BUNIONECTOMY Right 2000s   CARDIAC CATHETERIZATION  01/13/1995   normal  coronary anatomy and mild mitral stenosis and mild pulmonary hypertension   CARDIOVERSION N/A 09/22/2015   Procedure: CARDIOVERSION;  Surgeon: Hugh Madura, MD;  Location: [facility] ENDOSCOPY;  Service: Cardiovascular;  Laterality: N/A;   CARDIOVERSION N/A 10/25/2016   Procedure: CARDIOVERSION;  Surgeon: Elmyra Haggard, MD;  Location: [facility] ENDOSCOPY;  Service: Cardiovascular;  Laterality: N/A;   CARDIOVERSION N/A 04/15/2017   Procedure: CARDIOVERSION;  Surgeon: Loyde Rule, MD;  Location: [facility] ENDOSCOPY;  Service: Cardiovascular;  Laterality: N/A;   CARDIOVERSION N/A 05/16/2017   Procedure: CARDIOVERSION;  Surgeon: Hazle Lites, MD;  Location: [facility] ENDOSCOPY;  Service: Cardiovascular;  Laterality: N/A;   CARDIOVERSION N/A 01/24/2019   Procedure: CARDIOVERSION;  Surgeon: Hazle Lites, MD;  Location: [facility] ENDOSCOPY;  Service: Cardiovascular;  Laterality: N/A;   CARDIOVERSION N/A 10/14/2021   Procedure: CARDIOVERSION;  Surgeon: Wendie Hamburg, MD;  Location: [facility] ENDOSCOPY;  Service: Cardiovascular;  Laterality: N/A;   CARDIOVERSION N/A 11/20/2021   Procedure: CARDIOVERSION;  Surgeon: Elmyra Haggard, MD;  Location: [facility] ENDOSCOPY;  Service: Cardiovascular;  Laterality: N/A;   CATARACT EXTRACTION W/ INTRAOCULAR LENS  IMPLANT, BILATERAL Bilateral 2013   COLONOSCOPY  2013   EYE SURGERY Bilateral    cataracts   LAPAROSCOPIC CHOLECYSTECTOMY  1997   MINIMALLY INVASIVE MAZE PROCEDURE N/A 12/13/2018   Procedure: MINIMALLY INVASIVE MAZE PROCEDURE using a 45 MM AtriClip.;  Surgeon: Gardenia Jump, MD;  Location: [facility] OR;  Service: Open Heart Surgery;  Laterality: N/A;   MITRAL VALVE REPLACEMENT Right 12/13/2018   Procedure: MINIMALLY INVASIVE MITRAL VALVE (MV) REPLACEMENT using a Magna Mitral Ease 31 MM Valve.;  Surgeon: Gardenia Jump, MD;  Location: [facility] OR;  Service: Open Heart Surgery;  Laterality: Right;   PACEMAKER IMPLANT N/A 04/28/2023   Procedure: PACEMAKER IMPLANT;  Surgeon: Lei Pump, MD;  Location: [facility];  Service: Cardiovascular;  Laterality: N/A;   RIGHT/LEFT HEART CATH AND CORONARY ANGIOGRAPHY N/A 10/18/2018   Procedure: RIGHT/LEFT HEART CATH AND CORONARY ANGIOGRAPHY;  Surgeon: Arleen Lacer, MD;  Location: [facility] INVASIVE CV LAB;  Service: Cardiovascular;  Laterality: N/A;   TEE WITHOUT CARDIOVERSION N/A 06/23/2017   Procedure: TRANSESOPHAGEAL ECHOCARDIOGRAM (TEE);  Surgeon: Elmyra Haggard, MD;  Location: [facility] ENDOSCOPY;  Service: Cardiovascular;  Laterality: N/A;   TEE WITHOUT CARDIOVERSION N/A 10/17/2018   Procedure: TRANSESOPHAGEAL ECHOCARDIOGRAM (TEE);  Surgeon: Hugh Madura, MD;  Location: [facility] ENDOSCOPY;  Service: Cardiovascular;  Laterality: N/A;   TEE WITHOUT CARDIOVERSION N/A 12/13/2018   Procedure: TRANSESOPHAGEAL ECHOCARDIOGRAM  (TEE);  Surgeon: Gardenia Jump, MD;  Location: [facility] OR;  Service: Open Heart Surgery;  Laterality: N/A;   TONSILLECTOMY AND ADENOIDECTOMY  1990s   UVULOPALATOPHARYNGOPLASTY, TONSILLECTOMY AND SEPTOPLASTY  1990s    Current Outpatient Medications  Medication Sig Dispense Refill   acetaminophen  (TYLENOL ) 500 MG tablet Take 500 mg by mouth daily as needed for moderate pain.     ALPRAZolam  (XANAX ) 0.25 MG tablet Take 0.125-0.25 mg by mouth See admin instructions. Take 0.25 mg at night, may take 0.25 mg dose as needed for anxiety during the day     atorvastatin (LIPITOR) 10 MG tablet Take 10 mg by mouth 2 (two) times a week.     Calcium  Citrate-Vitamin D  (CALCIUM  + D PO) Take 1 tablet by mouth daily.     cetirizine (ZYRTEC) 10 MG tablet Take 10 mg by mouth at bedtime.     Cholecalciferol  (VITAMIN D ) 50 MCG (2000 UT) tablet Take 2,000 Units by  mouth daily. (Patient taking differently: Take 2,000 Units by mouth 2 (two) times a week.)     cyanocobalamin  (VITAMIN B12) 1000 MCG tablet Take 1,000 mcg by mouth daily. (Patient taking differently: Take 1,000 mcg by mouth 2 (two) times a week.)     diltiazem  (CARDIZEM  CD) 120 MG 24 hr capsule Take 1 capsule (120 mg total) by mouth at bedtime. 90 capsule 3   diltiazem  (CARDIZEM ) 30 MG tablet Take 1 tablet (30 mg total) by mouth 4 (four) times daily as needed (for heart rates greater than 100). 60 tablet 3   dofetilide  (TIKOSYN ) 500 MCG capsule Take 1 capsule (500 mcg total) by mouth 2 (two) times daily. 180 capsule 3   dorzolamide -timolol  (COSOPT ) 22.3-6.8 MG/ML ophthalmic solution Place 1 drop into both eyes 2 (two) times daily.     ELIQUIS  5 MG TABS tablet TAKE 1 TABLET BY MOUTH TWICE  DAILY 180 tablet 3   fluticasone (FLONASE) 50 MCG/ACT nasal spray Place 1 spray into both nostrils daily.     furosemide  (LASIX ) 20 MG tablet Take 1 tablet (20 mg total) by mouth daily as needed for fluid. For >3lb/overnight or >5lb/weekly note dose 30 tablet 4   gabapentin   (NEURONTIN ) 300 MG capsule Take 300 mg by mouth 2 (two) times daily.     hydrALAZINE  (APRESOLINE ) 25 MG tablet Take 1 tablet (25 mg total) by mouth in the morning and at bedtime. 180 tablet 3   latanoprost  (XALATAN ) 0.005 % ophthalmic solution Place 1 drop into both eyes at bedtime.   6   Lidocaine -Glycerin  (PREPARATION H EX) Apply 1 Application topically daily as needed (hemorrhoids).     magnesium  oxide (MAG-OX) 400 (240 Mg) MG tablet Take 1 tablet (400 mg total) by mouth every other day.     meclizine  (ANTIVERT ) 25 MG tablet Take 25 mg by mouth as needed for dizziness or nausea.     metoprolol  succinate (TOPROL -XL) 100 MG 24 hr tablet Take 1 tablet (100 mg total) by mouth 2 (two) times daily. Take with or immediately following a meal. 60 tablet 3   Multiple Vitamin (MULTIVITAMIN WITH MINERALS) TABS tablet Take 1 tablet by mouth daily.     NON FORMULARY Apply 1 application topically 2 (two) times daily as needed (for eczema). Traimcinolone/[employer] Moist Cream     oxybutynin  (DITROPAN -XL) 10 MG 24 hr tablet Take 10 mg by mouth daily. (Patient taking differently: Take 10 mg by mouth daily as needed (symptoms).)     pantoprazole  (PROTONIX ) 40 MG tablet Take 40 mg by mouth daily. (Patient taking differently: Take 40 mg by mouth 2 (two) times daily.)     polyethylene glycol (MIRALAX  / GLYCOLAX ) 17 g packet Take 17 g by mouth every other day.     potassium chloride  (KLOR-CON ) 10 MEQ tablet Take 1 tablet (10 mEq total) by mouth daily as needed (when you take Lasix ). 30 tablet 3   PRESCRIPTION MEDICATION Inhale into the lungs at bedtime. CPAP     Probiotic Product (PROBIOTIC PO) Take 1 capsule by mouth every evening.     sodium chloride  (OCEAN) 0.65 % SOLN nasal spray Place 1 spray into both nostrils at bedtime.     valsartan  (DIOVAN ) 160 MG tablet TAKE 1 TABLET BY MOUTH TWICE  DAILY 180 tablet 3   YUVAFEM  10 MCG TABS vaginal tablet Place 10 mcg vaginally daily. (Patient not taking: Reported on 05/06/2023)      No current facility-administered medications for this visit.    Allergies:  Tizanidine, Amoxicillin, Metoprolol  tartrate, and Oxaprozin   Social History:  The patient  reports that she has never smoked. She has never used smokeless tobacco. She reports current alcohol use. She reports that she does not use drugs.   Family History:  The patient's family history includes Diabetes in her brother; Heart attack in her mother; Heart failure in her maternal grandfather; Hypertension in her brother; Kidney disease in her brother.  ROS:  Please see the history of present illness.    All other systems are reviewed and otherwise negative.   PHYSICAL EXAM:  VS:  There were no vitals taken for this visit. BMI: There is no height or weight on file to calculate BMI. Well nourished, well developed, in no acute distress HEENT: normocephalic, atraumatic Neck: no JVD, carotid bruits or masses Cardiac:  RRR; bradycardic, no significant murmurs, no rubs, or gallops Lungs: CTA b/l, no wheezing, rhonchi or rales Abd: soft, nontender MS: no deformity or atrophy Ext: no edema Skin: warm and dry, no rash Neuro:  No gross deficits appreciated Psych: euthymic mood, full affect   EKG:  Done today and reviewed by myself shows  SB 42bpm, 1st degree AVblock , QTc    04/28/23: EPS/ablation CONCLUSIONS: 1.  Sinus rhythm upon presentation.   2.  EP study with induction of left atrial tachycardia 3.  Ablation of atrial tachycardia around the left atrial appendage  4. No early apparent complications.    04/25/23: TTE 1. Left ventricular ejection fraction, by estimation, is 60 to 65%. The  left ventricle has normal function. The left ventricle has no regional  wall motion abnormalities. Left ventricular diastolic function could not  be evaluated.   2. Right ventricular systolic function is normal. The right ventricular  size is normal. There is normal pulmonary artery systolic pressure. The   estimated right ventricular systolic pressure is 29.2 mmHg.   3. Left atrial size was severely dilated.   4. Right atrial size was moderately dilated.   5. The mitral valve has been repaired/replaced. No evidence of mitral  valve regurgitation. No evidence of mitral stenosis. The mean mitral valve  gradient is 5.0 mmHg with average heart rate of 92 bpm. There is a 31 mm  Edwards bioprosthetic valve present   in the mitral position. Echo findings are consistent with normal  structure and function of the mitral valve prosthesis.   6. Tricuspid valve regurgitation is mild to moderate.   7. The aortic valve is tricuspid. Aortic valve regurgitation is mild. No  aortic stenosis is present.   8. The inferior vena cava is dilated in size with >50% respiratory  variability, suggesting right atrial pressure of 8 mmHg.   Comparison(s): No significant change from prior study. Prior images  reviewed side by side. Mitral valve prosthesis gradients are higher mostly  due to faster heart rate, although pressure half time has also lengthened  slightly, suggesting there may also  be a mild reduction in the effective orifice area.   Aug 2024 monitor Patient had a min HR of 37 bpm, max HR of 131 bpm, and avg HR of 59 bpm.  Predominant underlying rhythm was Sinus Rhythm.  Atrial Flutter/SVT occurred (13% burden), ranging from 39-131 bpm (avg of 81 bpm), the longest 1 day 20 hours with an avg rate of 81 bpm.  Triggered episodes associated with atrial flutter/SVT Less than 1% ventricular and supraventricular ectopy   12/07/21: TTE 1. Left ventricular ejection fraction, by estimation, is 55 to 60%.  Left  ventricular ejection fraction by 3D volume is 55 %. The left ventricle has  normal function. The left ventricle has no regional wall motion  abnormalities. Left ventricular diastolic   function could not be evaluated. Elevated left ventricular end-diastolic  pressure. The average left ventricular global  longitudinal strain is -16.6  %. The global longitudinal strain is normal.   2. Right ventricular systolic function is normal. The right ventricular  size is normal. There is normal pulmonary artery systolic pressure. The  estimated right ventricular systolic pressure is 25.8 mmHg.   3. Left atrial size was mildly dilated.   4. The mitral valve has been repaired/replaced. No evidence of mitral  valve regurgitation. No evidence of mitral stenosis. The mean mitral valve  gradient is 3.0 mmHg at a HR of 52bpm. Echo findings are consistent with  normal structure and function of  the mitral valve prosthesis.   5. The aortic valve is tricuspid. There is moderate calcification of the  aortic valve. There is moderate thickening of the aortic valve. Aortic  valve regurgitation is mild. Aortic valve sclerosis/calcification is  present, without any evidence of aortic  stenosis. Aortic regurgitation PHT measures 448 msec.   6. Pulmonic valve regurgitation is moderate.   7. The inferior vena cava is normal in size with greater than 50%  respiratory variability, suggesting right atrial pressure of 3 mmHg.    09/24/20: TTE IMPRESSIONS   1. Left ventricular ejection fraction, by estimation, is 60 to 65%. The  left ventricle has normal function. The left ventricle has no regional  wall motion abnormalities. Left ventricular diastolic parameters are  indeterminate.   2. Right ventricular systolic function is normal. The right ventricular  size is normal. There is moderately elevated pulmonary artery systolic  pressure.   3. Left atrial size was moderately dilated.   4. The mitral valve has been repaired/replaced. Mild mitral valve  regurgitation. No evidence of mitral stenosis. The mean mitral valve  gradient is 2.8 mmHg with average heart rate of 55 bpm. Echo findings are  consistent with normal structure and  function of the mitral valve prosthesis.   5. The aortic valve is tricuspid. There is  mild calcification of the  aortic valve. There is mild thickening of the aortic valve. Aortic valve  regurgitation is mild. Mild aortic valve sclerosis is present, with no  evidence of aortic valve stenosis.  Aortic valve area, by VTI measures 2.29 cm. Aortic valve mean gradient  measures 6.5 mmHg. Aortic valve Vmax measures 1.69 m/s.   6. The inferior vena cava is normal in size with greater than 50%  respiratory variability, suggesting right atrial pressure of 3 mmHg.    TTE 01/23/19: 1. Left ventricular ejection fraction, by visual estimation, is 55 to  60%. The left ventricle has normal function. Left ventricular septal wall  thickness was mildly increased. Moderately increased left ventricular  posterior wall thickness. There is  mildly increased left ventricular hypertrophy.   2. Left ventricular diastolic parameters are indeterminate.   3. The left ventricle demonstrates regional wall motion abnormalities.   4. Elevated LVEDP. Hypokinesis of the basal septum consistent with  post-operative state.   5. Global right ventricle has normal systolic function.The right  ventricular size is normal. No increase in right ventricular wall  thickness.   6. Left atrial size was severely dilated.   7. Right atrial size was normal.   8. The mitral valve has been repaired/replaced. No evidence of mitral  valve  regurgitation. No evidence of mitral stenosis.   9. The tricuspid valve is normal in structure.  10. The aortic valve is tricuspid. Aortic valve regurgitation is mild. No  evidence of aortic valve sclerosis or stenosis.  11. The pulmonic valve was normal in structure. Pulmonic valve  regurgitation is not visualized.  12. Normal pulmonary artery systolic pressure.  13. The inferior vena cava is normal in size with <50% respiratory  variability, suggesting right atrial pressure of 8 mmHg.      10/18/2018: [facility] Hemodynamic findings consistent with mild pulmonary hypertension. LV end  diastolic pressure is normal. There is severe mitral valve stenosis -suggested by a echocardiogram, large PCWP V waves along with elevated PCWP but normal LVEDP would also suggest this. Angiographically normal coronary arteries   SUMMARY Angiographically normal coronary arteries Mild pulmonary hypertension with mildly elevated PCWP but normal LVEDP suggestive of mitral stenosis Significant V wave noted on PCWP waveform suggesting mitral stenosis.   RECOMMENDATIONS Return to nursing unit for ongoing care.   Proceed with plans for mitral valve replacement   06/24/2017 EPS/ablation CONCLUSIONS: 1. Sinus rhythm upon presentation.   2. Successful electrical isolation and anatomical encircling of all four pulmonary veins with radiofrequency current.    3. Cavo-tricuspid isthmus ablation was performed with complete bidirectional isthmus block achieved.  4. No inducible arrhythmias following ablation both on and off of dobutamine  5. No early apparent complications.   03/10/2017: EPS/ablation CONCLUSIONS: 1. Atrial fibrillation upon presentation.   2. Successful electrical isolation and anatomical encircling of all four pulmonary veins with radiofrequency current.  A WACA approach was used 3. Additional left atrial ablation was performed with a left atrial roof line  4. Atrial fibrillation successfully cardioverted to sinus rhythm. 5. No early apparent complications.     Recent Labs: 01/18/2023: ALT 22 04/21/2023: Hemoglobin 13.9; Platelets 237 04/25/2023: BUN 12; Creatinine, Ser 0.90; Magnesium  2.0; Potassium 3.7; Sodium 142  No results found for requested labs within last 365 days.   CrCl cannot be calculated (Patient's most recent lab result is older than the maximum 21 days allowed.).   Wt Readings from Last 3 Encounters:  05/06/23 161 lb 6.4 oz (73.2 kg)  04/28/23 161 lb 6.4 oz (73.2 kg)  04/22/23 161 lb 6.4 oz (73.2 kg)     Other studies reviewed: Additional studies/records  reviewed today include: summarized above  ASSESSMENT AND PLAN:  1. Persistent AFib 2. SVT 3. AFlutter CHA2DS2Vasc is 4, on Eliquis , appropriately dosed 4.  ATach     On Tikosyn , w/stable QTc   Bradycardic (not new for her) though symptoms likely 2/2 it She has not taken her metoprolol  this morning > skip today and tomorrow start 100mg  once daily (from BID) to take in the evening  Her HR log from home mostly 50's, some 60's, she reports some <50  If she does not see improvement in her HR, symptoms in the next few days or so, she is asked to let us  know, we can look to further reduce meds > perhaps stop the dilt next  BMET today   5. MVR (bioprosthetic)     Functioning well on echo 2025 6. Chronic pericardial effusion      None noted echo 2025     C/w Dr. Theodis Fiscal  7. HTN   8. Secondary hypercoagulable state   Disposition: back in 2 mo otherwise, sooner if needed   Current medicines are reviewed at length with the patient today.  The patient did not have any concerns  regarding medicines.  [place], PA-C 05/25/2023 3:16 PM     [facility] [address] [place] [address] [phone] (office)  [phone] (fax)

## 2023-05-26 ENCOUNTER — Ambulatory Visit: Attending: Physician Assistant | Admitting: Physician Assistant

## 2023-05-26 VITALS — BP 145/82 | HR 42 | Ht 60.0 in | Wt 166.0 lb

## 2023-05-26 DIAGNOSIS — I471 Supraventricular tachycardia, unspecified: Secondary | ICD-10-CM

## 2023-05-26 DIAGNOSIS — I4719 Other supraventricular tachycardia: Secondary | ICD-10-CM | POA: Diagnosis not present

## 2023-05-26 DIAGNOSIS — I10 Essential (primary) hypertension: Secondary | ICD-10-CM

## 2023-05-26 DIAGNOSIS — I4819 Other persistent atrial fibrillation: Secondary | ICD-10-CM

## 2023-05-26 DIAGNOSIS — Z79899 Other long term (current) drug therapy: Secondary | ICD-10-CM

## 2023-05-26 DIAGNOSIS — D6869 Other thrombophilia: Secondary | ICD-10-CM

## 2023-05-26 DIAGNOSIS — Z5181 Encounter for therapeutic drug level monitoring: Secondary | ICD-10-CM

## 2023-05-26 LAB — BASIC METABOLIC PANEL WITH GFR
BUN/Creatinine Ratio: 13 (ref 12–28)
BUN: 11 mg/dL (ref 8–27)
CO2: 22 mmol/L (ref 20–29)
Calcium: 9.2 mg/dL (ref 8.7–10.3)
Chloride: 104 mmol/L (ref 96–106)
Creatinine, Ser: 0.88 mg/dL (ref 0.57–1.00)
Glucose: 91 mg/dL (ref 70–99)
Potassium: 4.5 mmol/L (ref 3.5–5.2)
Sodium: 140 mmol/L (ref 134–144)
eGFR: 67 mL/min/{1.73_m2} (ref >59)

## 2023-05-26 MED ORDER — METOPROLOL SUCCINATE ER 100 MG PO TB24: 100.0000 mg | ORAL_TABLET | Freq: Every day | ORAL | 3 refills | Status: DC

## 2023-05-26 NOTE — Patient Instructions (Addendum)
 Medication Instructions:   START TAKING :  METOPROLOL  100 MG ONCE A DAY   *If you need a refill on your cardiac medications before your next appointment, please call your pharmacy*  Lab Work:  BMET TODAY   If you have labs (blood work) drawn today and your tests are completely normal, you will receive your results only by: MyChart Message (if you have MyChart) OR A paper copy in the mail If you have any lab test that is abnormal or we need to change your treatment, we will call you to review the results.  Testing/Procedures: NONE ORDERED  TODAY    Follow-Up: At [facility], you and your health needs are our priority.  As part of our continuing mission to provide you with exceptional heart care, our providers are all part of one team.  This team includes your primary Cardiologist (physician) and Advanced Practice Providers or APPs (Physician Assistants and Nurse Practitioners) who all work together to provide you with the care you need, when you need it.  Your next appointment:    2 month(s) ( CONTACT  CASSIE HALL/ ANGELINE HAMMER FOR EP SCHEDULING ISSUES )   Provider:   Agatha Horsfall, MD or Mertha Abrahams, PA-C    We recommend signing up for the patient portal called "MyChart".  Sign up information is provided on this After Visit Summary.  MyChart is used to connect with patients for Virtual Visits (Telemedicine).  Patients are able to view lab/test results, encounter notes, upcoming appointments, etc.  Non-urgent messages can be sent to your provider as well.   To learn more about what you can do with MyChart, go to [email or web address].   Other Instructions

## 2023-06-06 ENCOUNTER — Other Ambulatory Visit (HOSPITAL_BASED_OUTPATIENT_CLINIC_OR_DEPARTMENT_OTHER): Payer: Self-pay

## 2023-06-06 DIAGNOSIS — I6523 Occlusion and stenosis of bilateral carotid arteries: Secondary | ICD-10-CM

## 2023-06-06 NOTE — Progress Notes (Signed)
 Repeat carotid for oct 10/2024

## 2023-06-08 NOTE — Telephone Encounter (Signed)
 Called and spoke with patient. She reports heart rate has been in the 40's since this morning. She feels better when HR is in the 50's or higher. She states she is also having SOB with activity, occasionally at rest. She reports these symptoms are the same since she saw Renee in office on 5/1.  She would like to come off of Tikosyn  due to so many medication interactions.   Per protocol, instructed patient to hold metoprolol  tonight. Will forward message to Mertha Abrahams, PA-C to review and advise.  Advised patient on ED precautions if symptoms worsen, she verbalized understanding.

## 2023-06-09 ENCOUNTER — Telehealth: Payer: Self-pay | Admitting: Physician Assistant

## 2023-06-09 NOTE — Telephone Encounter (Signed)
 The patient skipped metoprolol  last night Today her HRs 50s and she feels improved but not "normal" still a little SOB, slightly lightheaded, though says that is not new No near syncope or syncope BP 140's/70's O2 sat ~95 or so  She will monitor her HR/symptoms tomorrow after 50mg  tonight, if HR is 50's or better  and feeling better will continue 50mg  daily If 40's-50s continue will have her do 50mg  every other day and she will let us  know  Advised ER precautions   Mertha Abrahams, PA-C

## 2023-06-13 ENCOUNTER — Ambulatory Visit: Payer: Self-pay | Admitting: *Deleted

## 2023-06-13 ENCOUNTER — Other Ambulatory Visit: Payer: Self-pay | Admitting: *Deleted

## 2023-06-13 ENCOUNTER — Encounter: Payer: Self-pay | Admitting: *Deleted

## 2023-06-13 MED ORDER — METOPROLOL TARTRATE 100 MG PO TABS: 50.0000 mg | ORAL_TABLET | Freq: Every day | ORAL | 3 refills | Status: DC

## 2023-06-22 ENCOUNTER — Ambulatory Visit: Payer: Medicare Other | Admitting: Physician Assistant

## 2023-07-09 ENCOUNTER — Other Ambulatory Visit (HOSPITAL_BASED_OUTPATIENT_CLINIC_OR_DEPARTMENT_OTHER): Payer: Self-pay | Admitting: Family

## 2023-07-10 ENCOUNTER — Other Ambulatory Visit: Payer: Self-pay | Admitting: Cardiovascular Disease

## 2023-08-03 ENCOUNTER — Other Ambulatory Visit (HOSPITAL_COMMUNITY): Payer: Self-pay

## 2023-08-04 NOTE — Progress Notes (Signed)
 Cardiology Office Note Date:  08/04/2023  Patient ID:  Rori, Goar 1945/03/19, MRN [id number] PCP:  Okey Carlin Redbird, MD  Cardiologist:  Dr. Raford Electrophysiologist: Dr. Inocencio    Chief Complaint:     planned f/u  History of Present Illness: SECILIA APPS is a 78 y.o. 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 Inocencio 09/09/22, unfortunately recurrent palpitations, mentions Aflutter, ER visit with DCCV Discussed options Repeat ablation Changing to amiodarone  She was going to give these options some thought  I saw her 10/19/22 She reports since starting Diltiazem  (she reports starting after weaaring the monitor/being in the ER) her AFib/flutter seems to have simmered down She would really like to avoid another procedure as well as amiodarone  When not in AF she feels well No CP, SOB No near syncope or syncope Reports good medication compliance No bleeding or signs of bleeding Stable EKG, improved burden Pt hoping to avoid amiodarone /ablation   ER visit 12/05/22, reported hit on the head by a TV antenna followed by nausea > CT negative, discharged from the ER, pt suspected nausea  2/2 cake she had eaten   I saw her 12/20/22 She had her hip injected las week Wed., that evening/overnight she woke about 4AM with a discomfort/pressure in her epigastrium/central chest.  She got up and checked her BP was OK, HR was OK Did not feel like Afib, suspected her GERD (given no arm, jaw pain or other symptoms), unfortunately no tums available. Eventually just settled away and has not happened again Though for a few days after the injection her HR  unusually in the 70's (her usual in the 50's, and seems as it settled back to her baseline, she feels tired, no energy, sluggish No symptoms of her AFib since our last visit Last week she did have a slight but steady weight gain maybe a little breathless or wheeze and took her PRN lasix  with quick return to her baseline weight and resolution of her wheeze. Timing unclear but thinks noted pre-injection (steroid). No near syncope or syncope No bleeding or signs of bleeding ? If symptoms 2/2 bradycardia Might consider stopping her coreg ? PLAN: Discussed her symptoms a length today EKG without acute changes No ongoing symptoms Planned to f/u w/Dr. Raford >> w/u as she felt indicated  Saw Dr. Raford 12/31/22. Jardiance  added for her HFpEF, echo in a year   I saw her 03/22/23 Had UTI > yeast infection > Jardiance  stopped and advised cards follow up Had been given and taken phenergan  Tikosyn  teaching revisit, continued  04/22/23 admitted with an SVT, initially planned to discharge from the hospital but became recurrent and admitted EP saw her felt to be an atrial tachycardia Coreg  >> metoprolol  Dilt increased Suspected she may need pacer/AV node ablation  Brought back 04/28/23 > found a left sided ATach > ablated (no pacer)  She saw cards team 05/06/23, frustrated with hip pain, reduced stamina, out of shape.doing well otherwise, no changes were made  I saw her 05/26/23: She had a slight and brief sense of palpitation this morning, none otherwise Though she is tired, and winded No  CP No near syncope or syncope but a sense of feeling lightheaded a lot Her husband has some dementia (he denies it) and this seems to be advancing, generally does ok but of late moments of aggression that are really anxiety provoking She worries a lot about him, his (their) future. Suspect symptoms 2/2 bradycardia and her BB reduced  TODAY  She is accompanied by her husband She is doing OK, has had a few  fleeting palpitations, none sustained, no tachycardia She again asks about stopping Tikosyn  2/2 medication interactions, inability to use anything helpful for nausea, and very much wants to get back on her hydrochlorothiazide , worries about times she needs antibiotics as well. No near syncope or syncope  She a couple days ago started to take 1/4 tab of her metoprolol  athst she is certain is succinate (not tartrate on her list), 2/2 fatigue  No CP, SOB No near syncope or syncope No bleeding or signs of bleeding  Needs some dental work done, uses abx prophylaxis  AFib/AAD Amiodarone  not tolerated 2/2 GI  Flecainide  2017 >> failed and had PR prolongation PVI ablation 03/10/2017 CTI/PVI Ablation May 2019 MAZE 2020 Tikosyn  Jan 2021 PVI ablation 02/23/22 04/28/23: EPS/ablation: Ablation of atrial tachycardia around the left atrial appendage   Past Medical History:  Diagnosis Date   Anxiety 06/29/2012   Aortic regurgitation 08/13/2015   Arthritis    hands, wrists, back, feet, toes (06/24/2017)   Atrial flutter (HCC) 09/18/2015   Atypical chest pain 05/13/2016   Chest pain    remote cath in 1996 with NORMAL coronaries noted   CHF (congestive heart failure) (HCC)    Dyspnea 10/30/2010   Essential hypertension 09/05/2013   Exogenous obesity    history of    GERD (gastroesophageal reflux disease)    Glaucoma, both eyes    Headache, migraine    stopped in my 50's (09/18/2015)   History of cardiovascular stress test 2007   showing no ischemia   Hypertension    Mitral stenosis and aortic insufficiency 06/23/2010   Mitral stenosis with insufficiency    Mitral stenosis with regurgitation    Murmur    of mitral stenosis and moderate aortic insufficiency       Nausea 05/01/2018   Obesity (BMI 30.0-34.9) 02/13/2021   OSA on CPAP    Palpitations    occasional   Paroxysmal A-fib (HCC) 06/24/2017   Pericardial effusion 09/18/2015   Persistent atrial fibrillation (HCC) 03/10/2017   Personal history  of rheumatic heart disease    S/P minimally-invasive maze operation for atrial fibrillation 12/13/2018   Complete bilateral atrial lesion set using cryothermy and bipolar radiofrequency ablation with clipping of LA appendage via right mini-thoracotomy approach   S/P minimally-invasive mitral valve replacement with bioprosthetic valve 12/13/2018   31 mm [facility] Mitral stented bovine pericardial tissue valve   SBE (subacute bacterial endocarditis)    prophylaxis, patient unaware   Sliding hiatal hernia    Stress 02/13/2021   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   Vertigo 05/01/2018    Past Surgical History:  Procedure Laterality Date   ABDOMINAL HYSTERECTOMY  1975   APPENDECTOMY  1977   ATRIAL FIBRILLATION ABLATION N/A 03/10/2017   Procedure: ATRIAL FIBRILLATION ABLATION;  Surgeon: Inocencio Soyla Lunger, MD;  Location: [facility] INVASIVE CV LAB;  Service: Cardiovascular;  Laterality: N/A;   ATRIAL FIBRILLATION ABLATION  06/24/2017   ATRIAL FIBRILLATION ABLATION N/A 06/24/2017   Procedure: ATRIAL FIBRILLATION ABLATION;  Surgeon: Inocencio Soyla Lunger, MD;  Location: [facility] INVASIVE CV  LAB;  Service: Cardiovascular;  Laterality: N/A;   ATRIAL FIBRILLATION ABLATION N/A 02/23/2022   Procedure: ATRIAL FIBRILLATION ABLATION;  Surgeon: Inocencio Soyla Lunger, MD;  Location: [facility] INVASIVE CV LAB;  Service: Cardiovascular;  Laterality: N/A;   ATRIAL TACH ABLATION N/A 04/28/2023   Procedure: ATRIAL TACH ABLATION;  Surgeon: Inocencio Soyla Lunger, MD;  Location: [facility] INVASIVE CV LAB;  Service: Cardiovascular;  Laterality: N/A;   AV NODE ABLATION N/A 04/28/2023   Procedure: AV NODE ABLATION;  Surgeon: Inocencio Soyla Lunger, MD;  Location: [facility] INVASIVE CV LAB;  Service: Cardiovascular;  Laterality: N/A;   BUNIONECTOMY Right 2000s   CARDIAC CATHETERIZATION  01/13/1995   normal coronary anatomy and mild mitral stenosis and mild pulmonary hypertension   CARDIOVERSION N/A 09/22/2015   Procedure: CARDIOVERSION;   Surgeon: Oneil JAYSON Parchment, MD;  Location: [facility] ENDOSCOPY;  Service: Cardiovascular;  Laterality: N/A;   CARDIOVERSION N/A 10/25/2016   Procedure: CARDIOVERSION;  Surgeon: Okey Vina GAILS, MD;  Location: [facility] ENDOSCOPY;  Service: Cardiovascular;  Laterality: N/A;   CARDIOVERSION N/A 04/15/2017   Procedure: CARDIOVERSION;  Surgeon: Delford Maude JAYSON, MD;  Location: [facility] ENDOSCOPY;  Service: Cardiovascular;  Laterality: N/A;   CARDIOVERSION N/A 05/16/2017   Procedure: CARDIOVERSION;  Surgeon: Mona Vinie JAYSON, MD;  Location: [facility] ENDOSCOPY;  Service: Cardiovascular;  Laterality: N/A;   CARDIOVERSION N/A 01/24/2019   Procedure: CARDIOVERSION;  Surgeon: Mona Vinie JAYSON, MD;  Location: [facility] ENDOSCOPY;  Service: Cardiovascular;  Laterality: N/A;   CARDIOVERSION N/A 10/14/2021   Procedure: CARDIOVERSION;  Surgeon: Kate Lonni CROME, MD;  Location: [facility] ENDOSCOPY;  Service: Cardiovascular;  Laterality: N/A;   CARDIOVERSION N/A 11/20/2021   Procedure: CARDIOVERSION;  Surgeon: Okey Vina GAILS, MD;  Location: [facility] ENDOSCOPY;  Service: Cardiovascular;  Laterality: N/A;   CATARACT EXTRACTION W/ INTRAOCULAR LENS  IMPLANT, BILATERAL Bilateral 2013   COLONOSCOPY  2013   EYE SURGERY Bilateral    cataracts   LAPAROSCOPIC CHOLECYSTECTOMY  1997   MINIMALLY INVASIVE MAZE PROCEDURE N/A 12/13/2018   Procedure: MINIMALLY INVASIVE MAZE PROCEDURE using a 45 MM AtriClip.;  Surgeon: Dusty Sudie DEL, MD;  Location: [facility] OR;  Service: Open Heart Surgery;  Laterality: N/A;   MITRAL VALVE REPLACEMENT Right 12/13/2018   Procedure: MINIMALLY INVASIVE MITRAL VALVE (MV) REPLACEMENT using a Magna Mitral Ease 31 MM Valve.;  Surgeon: Dusty Sudie DEL, MD;  Location: [facility] OR;  Service: Open Heart Surgery;  Laterality: Right;   PACEMAKER IMPLANT N/A 04/28/2023   Procedure: PACEMAKER IMPLANT;  Surgeon: Inocencio Soyla Lunger, MD;  Location: [facility] INVASIVE CV LAB;  Service: Cardiovascular;  Laterality: N/A;   RIGHT/LEFT HEART CATH AND CORONARY ANGIOGRAPHY N/A  10/18/2018   Procedure: RIGHT/LEFT HEART CATH AND CORONARY ANGIOGRAPHY;  Surgeon: Anner Alm ORN, MD;  Location: [facility] INVASIVE CV LAB;  Service: Cardiovascular;  Laterality: N/A;   TEE WITHOUT CARDIOVERSION N/A 06/23/2017   Procedure: TRANSESOPHAGEAL ECHOCARDIOGRAM (TEE);  Surgeon: Okey Vina GAILS, MD;  Location: [facility] ENDOSCOPY;  Service: Cardiovascular;  Laterality: N/A;   TEE WITHOUT CARDIOVERSION N/A 10/17/2018   Procedure: TRANSESOPHAGEAL ECHOCARDIOGRAM (TEE);  Surgeon: Parchment Oneil JAYSON, MD;  Location: [facility] ENDOSCOPY;  Service: Cardiovascular;  Laterality: N/A;   TEE WITHOUT CARDIOVERSION N/A 12/13/2018   Procedure: TRANSESOPHAGEAL ECHOCARDIOGRAM (TEE);  Surgeon: Dusty Sudie DEL, MD;  Location: [facility] OR;  Service: Open Heart Surgery;  Laterality: N/A;   TONSILLECTOMY AND ADENOIDECTOMY  1990s   UVULOPALATOPHARYNGOPLASTY, TONSILLECTOMY AND SEPTOPLASTY  1990s    Current Outpatient Medications  Medication Sig Dispense Refill   acetaminophen  (TYLENOL ) 500 MG  tablet Take 500 mg by mouth daily as needed for moderate pain.     ALPRAZolam  (XANAX ) 0.25 MG tablet Take 0.125-0.25 mg by mouth See admin instructions. Take 0.25 mg at night, may take 0.25 mg dose as needed for anxiety during the day     atorvastatin (LIPITOR) 10 MG tablet Take 10 mg by mouth 2 (two) times a week.     Calcium  Citrate-Vitamin D  (CALCIUM  + D PO) Take 1 tablet by mouth daily.     cetirizine (ZYRTEC) 10 MG tablet Take 10 mg by mouth at bedtime.     Cholecalciferol  (VITAMIN D ) 50 MCG (2000 UT) tablet Take 2,000 Units by mouth daily. (Patient taking differently: Take 2,000 Units by mouth 2 (two) times a week.)     cyanocobalamin  (VITAMIN B12) 1000 MCG tablet Take 1,000 mcg by mouth daily. (Patient taking differently: Take 1,000 mcg by mouth 2 (two) times a week.)     diltiazem  (CARDIZEM  CD) 120 MG 24 hr capsule Take 1 capsule (120 mg total) by mouth at bedtime. 90 capsule 3   diltiazem  (CARDIZEM ) 30 MG tablet Take 1 tablet (30 mg total) by  mouth 4 (four) times daily as needed (for heart rates greater than 100). 60 tablet 3   dofetilide  (TIKOSYN ) 500 MCG capsule TAKE 1 CAPSULE BY MOUTH TWICE  DAILY 180 capsule 3   dorzolamide -timolol  (COSOPT ) 22.3-6.8 MG/ML ophthalmic solution Place 1 drop into both eyes 2 (two) times daily.     ELIQUIS  5 MG TABS tablet TAKE 1 TABLET BY MOUTH TWICE  DAILY 180 tablet 3   fluticasone (FLONASE) 50 MCG/ACT nasal spray Place 1 spray into both nostrils daily.     furosemide  (LASIX ) 20 MG tablet Take 1 tablet (20 mg total) by mouth daily as needed for fluid. For >3lb/overnight or >5lb/weekly note dose 30 tablet 4   gabapentin  (NEURONTIN ) 300 MG capsule Take 300 mg by mouth 2 (two) times daily.     hydrALAZINE  (APRESOLINE ) 25 MG tablet TAKE 1 TABLET (25 MG) BY MOUTH IN THE MORNING AND AT BEDTIME 180 tablet 3   latanoprost  (XALATAN ) 0.005 % ophthalmic solution Place 1 drop into both eyes at bedtime.   6   Lidocaine -Glycerin  (PREPARATION H EX) Apply 1 Application topically daily as needed (hemorrhoids).     magnesium  oxide (MAG-OX) 400 (240 Mg) MG tablet Take 1 tablet (400 mg total) by mouth every other day.     meclizine  (ANTIVERT ) 25 MG tablet Take 25 mg by mouth as needed for dizziness or nausea.     metoprolol  tartrate (LOPRESSOR ) 100 MG tablet Take 0.5 tablets (50 mg total) by mouth daily with supper. 45 tablet 3   Multiple Vitamin (MULTIVITAMIN WITH MINERALS) TABS tablet Take 1 tablet by mouth daily.     NON FORMULARY Apply 1 application topically 2 (two) times daily as needed (for eczema). Traimcinolone/[employer] Moist Cream     oxybutynin  (DITROPAN -XL) 10 MG 24 hr tablet Take 10 mg by mouth daily. (Patient taking differently: Take 10 mg by mouth daily as needed (symptoms).)     pantoprazole  (PROTONIX ) 40 MG tablet Take 40 mg by mouth daily. (Patient taking differently: Take 40 mg by mouth 2 (two) times daily.)     polyethylene glycol (MIRALAX  / GLYCOLAX ) 17 g packet Take 17 g by mouth every other day.      potassium chloride  (KLOR-CON ) 10 MEQ tablet Take 1 tablet (10 mEq total) by mouth daily as needed (when you take Lasix ). 30 tablet 3  PRESCRIPTION MEDICATION Inhale into the lungs at bedtime. CPAP     Probiotic Product (PROBIOTIC PO) Take 1 capsule by mouth every evening.     sodium chloride  (OCEAN) 0.65 % SOLN nasal spray Place 1 spray into both nostrils at bedtime.     valsartan  (DIOVAN ) 160 MG tablet TAKE 1 TABLET BY MOUTH TWICE  DAILY 180 tablet 3   YUVAFEM  10 MCG TABS vaginal tablet Place 10 mcg vaginally daily.     No current facility-administered medications for this visit.    Allergies:   Tizanidine, Amoxicillin, Metoprolol  tartrate, and Oxaprozin   Social History:  The patient  reports that she has never smoked. She has never used smokeless tobacco. She reports current alcohol use. She reports that she does not use drugs.   Family History:  The patient's family history includes Diabetes in her brother; Heart attack in her mother; Heart failure in her maternal grandfather; Hypertension in her brother; Kidney disease in her brother.  ROS:  Please see the history of present illness.    All other systems are reviewed and otherwise negative.   PHYSICAL EXAM:  VS:  There were no vitals taken for this visit. BMI: There is no height or weight on file to calculate BMI. Well nourished, well developed, in no acute distress HEENT: normocephalic, atraumatic Neck: no JVD, carotid bruits or masses Cardiac:  RRR; no significant murmurs, no rubs, or gallops Lungs: CTA b/l, no wheezing, rhonchi or rales Abd: soft, nontender MS: no deformity or atrophy Ext: no edema Skin: warm and dry, no rash Neuro:  No gross deficits appreciated Psych: euthymic mood, full affect   EKG:  Done today and reviewed by myself shows  SB 56bpm, 1st degree AVblock , QTc  05/26/23: SB 42bpm, 1st degree AVblock , QTc    04/28/23: EPS/ablation CONCLUSIONS: 1.  Sinus rhythm upon presentation.    2.  EP study with induction of left atrial tachycardia 3.  Ablation of atrial tachycardia around the left atrial appendage  4. No early apparent complications.    04/25/23: TTE 1. Left ventricular ejection fraction, by estimation, is 60 to 65%. The  left ventricle has normal function. The left ventricle has no regional  wall motion abnormalities. Left ventricular diastolic function could not  be evaluated.   2. Right ventricular systolic function is normal. The right ventricular  size is normal. There is normal pulmonary artery systolic pressure. The  estimated right ventricular systolic pressure is 29.2 mmHg.   3. Left atrial size was severely dilated.   4. Right atrial size was moderately dilated.   5. The mitral valve has been repaired/replaced. No evidence of mitral  valve regurgitation. No evidence of mitral stenosis. The mean mitral valve  gradient is 5.0 mmHg with average heart rate of 92 bpm. There is a 31 mm  Edwards bioprosthetic valve present   in the mitral position. Echo findings are consistent with normal  structure and function of the mitral valve prosthesis.   6. Tricuspid valve regurgitation is mild to moderate.   7. The aortic valve is tricuspid. Aortic valve regurgitation is mild. No  aortic stenosis is present.   8. The inferior vena cava is dilated in size with >50% respiratory  variability, suggesting right atrial pressure of 8 mmHg.   Comparison(s): No significant change from prior study. Prior images  reviewed side by side. Mitral valve prosthesis gradients are higher mostly  due to faster heart rate, although pressure half time has also lengthened  slightly, suggesting there may also  be a mild reduction in the effective orifice area.   Aug 2024 monitor Patient had a min HR of 37 bpm, max HR of 131 bpm, and avg HR of 59 bpm.  Predominant underlying rhythm was Sinus Rhythm.  Atrial Flutter/SVT occurred (13% burden), ranging from 39-131 bpm (avg of 81  bpm), the longest 1 day 20 hours with an avg rate of 81 bpm.  Triggered episodes associated with atrial flutter/SVT Less than 1% ventricular and supraventricular ectopy   12/07/21: TTE 1. Left ventricular ejection fraction, by estimation, is 55 to 60%. Left  ventricular ejection fraction by 3D volume is 55 %. The left ventricle has  normal function. The left ventricle has no regional wall motion  abnormalities. Left ventricular diastolic   function could not be evaluated. Elevated left ventricular end-diastolic  pressure. The average left ventricular global longitudinal strain is -16.6  %. The global longitudinal strain is normal.   2. Right ventricular systolic function is normal. The right ventricular  size is normal. There is normal pulmonary artery systolic pressure. The  estimated right ventricular systolic pressure is 25.8 mmHg.   3. Left atrial size was mildly dilated.   4. The mitral valve has been repaired/replaced. No evidence of mitral  valve regurgitation. No evidence of mitral stenosis. The mean mitral valve  gradient is 3.0 mmHg at a HR of 52bpm. Echo findings are consistent with  normal structure and function of  the mitral valve prosthesis.   5. The aortic valve is tricuspid. There is moderate calcification of the  aortic valve. There is moderate thickening of the aortic valve. Aortic  valve regurgitation is mild. Aortic valve sclerosis/calcification is  present, without any evidence of aortic  stenosis. Aortic regurgitation PHT measures 448 msec.   6. Pulmonic valve regurgitation is moderate.   7. The inferior vena cava is normal in size with greater than 50%  respiratory variability, suggesting right atrial pressure of 3 mmHg.    09/24/20: TTE IMPRESSIONS   1. Left ventricular ejection fraction, by estimation, is 60 to 65%. The  left ventricle has normal function. The left ventricle has no regional  wall motion abnormalities. Left ventricular diastolic parameters  are  indeterminate.   2. Right ventricular systolic function is normal. The right ventricular  size is normal. There is moderately elevated pulmonary artery systolic  pressure.   3. Left atrial size was moderately dilated.   4. The mitral valve has been repaired/replaced. Mild mitral valve  regurgitation. No evidence of mitral stenosis. The mean mitral valve  gradient is 2.8 mmHg with average heart rate of 55 bpm. Echo findings are  consistent with normal structure and  function of the mitral valve prosthesis.   5. The aortic valve is tricuspid. There is mild calcification of the  aortic valve. There is mild thickening of the aortic valve. Aortic valve  regurgitation is mild. Mild aortic valve sclerosis is present, with no  evidence of aortic valve stenosis.  Aortic valve area, by VTI measures 2.29 cm. Aortic valve mean gradient  measures 6.5 mmHg. Aortic valve Vmax measures 1.69 m/s.   6. The inferior vena cava is normal in size with greater than 50%  respiratory variability, suggesting right atrial pressure of 3 mmHg.    TTE 01/23/19: 1. Left ventricular ejection fraction, by visual estimation, is 55 to  60%. The left ventricle has normal function. Left ventricular septal wall  thickness was mildly increased. Moderately increased left ventricular  posterior wall thickness. There is  mildly increased left ventricular hypertrophy.   2. Left ventricular diastolic parameters are indeterminate.   3. The left ventricle demonstrates regional wall motion abnormalities.   4. Elevated LVEDP. Hypokinesis of the basal septum consistent with  post-operative state.   5. Global right ventricle has normal systolic function.The right  ventricular size is normal. No increase in right ventricular wall  thickness.   6. Left atrial size was severely dilated.   7. Right atrial size was normal.   8. The mitral valve has been repaired/replaced. No evidence of mitral  valve regurgitation. No evidence  of mitral stenosis.   9. The tricuspid valve is normal in structure.  10. The aortic valve is tricuspid. Aortic valve regurgitation is mild. No  evidence of aortic valve sclerosis or stenosis.  11. The pulmonic valve was normal in structure. Pulmonic valve  regurgitation is not visualized.  12. Normal pulmonary artery systolic pressure.  13. The inferior vena cava is normal in size with <50% respiratory  variability, suggesting right atrial pressure of 8 mmHg.      10/18/2018: [facility] Hemodynamic findings consistent with mild pulmonary hypertension. LV end diastolic pressure is normal. There is severe mitral valve stenosis -suggested by a echocardiogram, large PCWP V waves along with elevated PCWP but normal LVEDP would also suggest this. Angiographically normal coronary arteries   SUMMARY Angiographically normal coronary arteries Mild pulmonary hypertension with mildly elevated PCWP but normal LVEDP suggestive of mitral stenosis Significant V wave noted on PCWP waveform suggesting mitral stenosis.   RECOMMENDATIONS Return to nursing unit for ongoing care.   Proceed with plans for mitral valve replacement   06/24/2017 EPS/ablation CONCLUSIONS: 1. Sinus rhythm upon presentation.   2. Successful electrical isolation and anatomical encircling of all four pulmonary veins with radiofrequency current.    3. Cavo-tricuspid isthmus ablation was performed with complete bidirectional isthmus block achieved.  4. No inducible arrhythmias following ablation both on and off of dobutamine  5. No early apparent complications.   03/10/2017: EPS/ablation CONCLUSIONS: 1. Atrial fibrillation upon presentation.   2. Successful electrical isolation and anatomical encircling of all four pulmonary veins with radiofrequency current.  A WACA approach was used 3. Additional left atrial ablation was performed with a left atrial roof line  4. Atrial fibrillation successfully cardioverted to sinus rhythm. 5. No  early apparent complications.     Recent Labs: 01/18/2023: ALT 22 04/21/2023: Hemoglobin 13.9; Platelets 237 04/25/2023: Magnesium  2.0 05/26/2023: BUN 11; Creatinine, Ser 0.88; Potassium 4.5; Sodium 140  No results found for requested labs within last 365 days.   CrCl cannot be calculated (Patient's most recent lab result is older than the maximum 21 days allowed.).   Wt Readings from Last 3 Encounters:  05/26/23 166 lb (75.3 kg)  05/06/23 161 lb 6.4 oz (73.2 kg)  04/28/23 161 lb 6.4 oz (73.2 kg)     Other studies reviewed: Additional studies/records reviewed today include: summarized above  ASSESSMENT AND PLAN:  1. Persistent AFib 2. SVT 3. AFlutter CHA2DS2Vasc is 4, on Eliquis , appropriately dosed 4.  ATach     On Tikosyn , w/stable QTc   We again visited her preference to stop Tikosyn  Discussed that we can stop it, but may run into recurrent arrhythmia. If we do, will need to go back into the hospital to resume Tikosyn  Or consider amio She would like to come off and we will stop it   5. MVR (bioprosthetic)     Functioning well on echo 2025  6. Chronic pericardial effusion      None noted echo 2025     C/w Dr. Raford  7. HTN     Home BPs look pretty good, home HRs 50's a couple 40's, not since her last reduction in BB dose  She is certain she is taking metoprolol  succ, not tartrate and currently cutting her pill taking 25mg  BID She sees cards team soon, she will bring the bottle with her and they will decide on dosing  8. Secondary hypercoagulable state   Disposition: back in 2-3 mo otherwise, sooner if needed   Current medicines are reviewed at length with the patient today.  The patient did not have any concerns regarding medicines.  Bonney Charlies Arthur, PA-C 08/04/2023 7:24 AM     [facility] [address] [place] [address] [phone] (office)  [phone] (fax)

## 2023-08-05 ENCOUNTER — Ambulatory Visit: Attending: Physician Assistant | Admitting: Physician Assistant

## 2023-08-05 VITALS — BP 156/56 | Ht 60.0 in | Wt 163.0 lb

## 2023-08-05 DIAGNOSIS — I4819 Other persistent atrial fibrillation: Secondary | ICD-10-CM | POA: Diagnosis not present

## 2023-08-05 DIAGNOSIS — Z79899 Other long term (current) drug therapy: Secondary | ICD-10-CM

## 2023-08-05 DIAGNOSIS — Z5181 Encounter for therapeutic drug level monitoring: Secondary | ICD-10-CM

## 2023-08-05 DIAGNOSIS — I471 Supraventricular tachycardia, unspecified: Secondary | ICD-10-CM

## 2023-08-05 DIAGNOSIS — I10 Essential (primary) hypertension: Secondary | ICD-10-CM

## 2023-08-05 DIAGNOSIS — I4892 Unspecified atrial flutter: Secondary | ICD-10-CM

## 2023-08-05 DIAGNOSIS — D6869 Other thrombophilia: Secondary | ICD-10-CM

## 2023-08-05 NOTE — Patient Instructions (Addendum)
 Medication Instructions:   STOP TAKING AND REMOVE THIS MEDICATION FROM YOUR MEDICATION LIST:  TIKOSYN    PLEASE MAKE SURE TO SEND PICTURE OF METOPROLOL  BOTTLE ON MYCHART   Lab Work: NONE ORDERED  TODAY   If you have labs (blood work) drawn today and your tests are completely normal, you will receive your results only by: [employer] (if you have MyChart) OR A paper copy in the mail If you have any lab test that is abnormal or we need to change your treatment, we will call you to review the results.   Testing/Procedures: NONE ORDERED  TODAY   Follow-Up:  At [facility], you and your health needs are our priority.  As part of our continuing mission to provide you with exceptional heart care, our providers are all part of one team.  This team includes your primary Cardiologist (physician) and Advanced Practice Providers or APPs (Physician Assistants and Nurse Practitioners) who all work together to provide you with the care you need, when you need it.   Your next appointment:    2-3 month(s)  ( CONTACT  CASSIE HALL/ ANGELINE HAMMER FOR EP SCHEDULING ISSUES )   Provider:    You may see Will Gladis Norton, MD or one of the following Advanced Practice Providers on your designated Care Team:   Charlies Arthur, [place]    We recommend signing up for the patient portal called MyChart.  Sign up information is provided on this After Visit Summary.  MyChart is used to connect with patients for Virtual Visits (Telemedicine).  Patients are able to view lab/test results, encounter notes, upcoming appointments, etc.  Non-urgent messages can be sent to your provider as well.   To learn more about what you can do with MyChart, go to [email or web address].   Other Instructions

## 2023-08-22 ENCOUNTER — Encounter (HOSPITAL_BASED_OUTPATIENT_CLINIC_OR_DEPARTMENT_OTHER): Payer: Self-pay | Admitting: Family

## 2023-08-22 ENCOUNTER — Ambulatory Visit (HOSPITAL_BASED_OUTPATIENT_CLINIC_OR_DEPARTMENT_OTHER): Admitting: Family

## 2023-08-22 VITALS — BP 130/60 | HR 60 | Ht 60.0 in | Wt 163.5 lb

## 2023-08-22 DIAGNOSIS — D6859 Other primary thrombophilia: Secondary | ICD-10-CM | POA: Diagnosis not present

## 2023-08-22 DIAGNOSIS — I10 Essential (primary) hypertension: Secondary | ICD-10-CM | POA: Diagnosis not present

## 2023-08-22 DIAGNOSIS — Z9889 Other specified postprocedural states: Secondary | ICD-10-CM | POA: Diagnosis not present

## 2023-08-22 DIAGNOSIS — I4819 Other persistent atrial fibrillation: Secondary | ICD-10-CM | POA: Diagnosis not present

## 2023-08-22 MED ORDER — DILTIAZEM HCL ER COATED BEADS 120 MG PO CP24: 120.0000 mg | ORAL_CAPSULE | Freq: Every day | ORAL | 3 refills | Status: AC

## 2023-08-22 MED ORDER — METOPROLOL SUCCINATE ER 50 MG PO TB24: 50.0000 mg | ORAL_TABLET | Freq: Every day | ORAL | 3 refills | Status: AC

## 2023-08-22 NOTE — Patient Instructions (Signed)
 Medication Instructions:   CHANGE Metoprolol  Succinate to one 50mg  tablet daily. May take additional half tablet as needed for palpitations.   *If you need a refill on your cardiac medications before your next appointment, please call your pharmacy*  Follow-Up: At [facility], you and your health needs are our priority.  As part of our continuing mission to provide you with exceptional heart care, our providers are all part of one team.  This team includes your primary Cardiologist (physician) and Advanced Practice Providers or APPs (Physician Assistants and Nurse Practitioners) who all work together to provide you with the care you need, when you need it.  Your next appointment:   4-6 month(s)  Provider:   Annabella Scarce, MD, Rosaline Bane, NP, or Reche Finder, NP    We recommend signing up for the patient portal called MyChart.  Sign up information is provided on this After Visit Summary.  MyChart is used to connect with patients for Virtual Visits (Telemedicine).  Patients are able to view lab/test results, encounter notes, upcoming appointments, etc.  Non-urgent messages can be sent to your provider as well.   To learn more about what you can do with MyChart, go to [email or web address].   Other Instructions  On Youtube, search Sitting Exercises for Seniors and follow along.  Exercises to do While Sitting Warm-up Before starting other exercises: Sit up as straight as you can. Have your knees bent at 90 degrees, which is the shape of the capital letter L. Keep your feet flat on the floor. Sit at the front edge of your chair, if you can. Pull in (tighten) the muscles in your abdomen and stretch your spine and neck as straight as you can. Hold this position for a few minutes. Breathe in and out evenly. Try to concentrate on your breathing, and relax your mind.  Stretching Exercise A: Arm stretch Hold your arms out straight in front of your body. Bend your  hands at the wrist with your fingers pointing up, as if signaling someone to stop. Notice the slight tension in your forearms as you hold the position. Keeping your arms out and your hands bent, rotate your hands outward as far as you can and hold this stretch. Aim to have your thumbs pointing up and your pinkie fingers pointing down. Slowly repeat arm stretches for one minute as tolerated. Exercise B: Leg stretch If you can move your legs, try to draw letters on the floor with the toes of your foot. Write your name with one foot. Write your name with the toes of your other foot. Slowly repeat the movements for one minute as tolerated. Exercise C: Reach for the sky Reach your hands as far over your head as you can to stretch your spine. Move your hands and arms as if you are climbing a rope. Slowly repeat the movements for one minute as tolerated.  Range of motion exercises Exercise A: Shoulder roll Let your arms hang loosely at your sides. Lift just your shoulders up toward your ears, then let them relax back down. When your shoulders feel loose, rotate your shoulders in backward and forward circles. Do shoulder rolls slowly for one minute as tolerated. Exercise B: March in place As if you are marching, pump your arms and lift your legs up and down. Lift your knees as high as you can. If you are unable to lift your knees, just pump your arms and move your ankles and feet up and down. March in place  for one minute as tolerated. Exercise C: Seated jumping jacks Let your arms hang down straight. Keeping your arms straight, lift them up over your head. Aim to point your fingers to the ceiling. While you lift your arms, straighten your legs and slide your heels along the floor to your sides, as wide as you can. As you bring your arms back down to your sides, slide your legs back together. If you are unable to use your legs, just move your arms. Slowly repeat seated jumping jacks for one  minute as tolerated.  Strengthening exercises Exercise A: Shoulder squeeze Hold your arms straight out from your body to your sides, with your elbows bent and your fists pointed at the ceiling. Keeping your arms in the bent position, move them forward so your elbows and forearms meet in front of your face. Open your arms back out as wide as you can with your elbows still bent, until you feel your shoulder blades squeezing together. Hold for 5 seconds. Slowly repeat the movements forward and backward for one minute as tolerated.

## 2023-08-22 NOTE — Progress Notes (Signed)
 Cardiology Office Note:  .   Date:  08/22/2023  ID:  Mary Farrell, DOB 21-Apr-1945, MRN [id number] PCP: Okey Carlin Redbird, MD  [facility] Providers Cardiologist:  Annabella Scarce, MD Electrophysiologist:  Will Gladis Norton, MD    History of Present Illness: Mary Farrell is a 78 y.o. female with a hx of moderate aortic regurgitation and mitral stenosis s/p surgical MVR, atrial clip, MAZE 11/2018, atrial fibrillation/flutter s/p atrial fibrillation ablation 03/10/2017 with repeat ablation 06/03/2017 with PVI ablation 02/23/22, hypertension .     Follows with Dr. Norton of EP.  She has been on multiple antiarrhythmics. Failed Flecainide  in 2017 due to PR prolongation. Intolerant of Amiodarone  with nausea. Tikosyn  admission 01/2019.  Seen 09/25/2021 by Dr. Norton with atrial flutter and subsequent cardioversion 10/14/2021. Underwent atrial fibrillation ablation 02/23/2022.   At visit 06/2022 Jardiance  added for HFpEF benefit but had to be stopped for yeast infection.   Admitted 3/27-3/31/25  for SVT which was thought to be an atrial tachycardia. Coreg  transitioned ot Metoprolol . Cardizem  increased to 250mg  daily. Dofetilide  continued. Echo 04/25/23 LVEF 60-65%, no RWMA, RV normal, severe LAE, moderate RAE, normally functioning MV prosthesis, mild to moderate TR, mild AI.  On 04/28/23 underwent atrial tach ablation, AV node ablation.  Discussed the use of AI scribe software for clinical note transcription with the patient, who gave verbal consent to proceed.  History of Present Illness At visit 05/26/23 with EP due to bradycardia Metoprolol  Succinate reduce from BID to daily.   She saw Charlies Arthur, [place] 08/05/23 with only fleeting palpitations. Per her preference Tikosyn  was stopped.   Mary Farrell is a 78 year old female with atrial fibrillation who presents for medication management and follow-up.  She takes metoprolol  succinate 50 mg doses daily having independently reduced the dose and  feels better on this regimen. However, she sometimes experiences heart racing and fluttering, particularly after physical activities. Her heart rate ranges from the 50s to 70s, described as higher than previous but not irregular. She has not experienced arrhytmia since discontinuing Tikosyn .  Average BP has been well controlled at home at goal <130/80 and home cuff verified today. She inquires about starting Yuvafem  with her cardiovascular history.   No chest pain. Stable mild exertional dyspnea. Minimal edema improved with rare use of PRN Lasix  though notes may not be as effective as previous.   Planning to return to seated exercise, exercise limited by hip pain followed by orthopedics.   ROS: Please see the history of present illness.    All other systems reviewed and are negative.     Studies Reviewed: .           Risk Assessment/Calculations:     CHA2DS2-VASc Score = 5   This indicates a 7.2% annual risk of stroke. The patient's score is based upon: CHF History: 1 HTN History: 1 Diabetes History: 0 Stroke History: 0 Vascular Disease History: 0 Age Score: 2 Gender Score: 1             Physical Exam:   VS:  BP 130/60 (BP Location: Left Arm, Patient Position: Sitting, Cuff Size: Normal)   Pulse 60   Ht 5' (1.524 m)   Wt 163 lb 8 oz (74.2 kg)   SpO2 97%   BMI 31.93 kg/m    Wt Readings from Last 3 Encounters:  08/22/23 163 lb 8 oz (74.2 kg)  08/05/23 163 lb (73.9 kg)  05/26/23 166 lb (75.3 kg)  GEN: Well nourished, well developed in no acute distress NECK: No JVD; No carotid bruits CARDIAC: RRR, no murmurs, rubs, gallops RESPIRATORY:  Clear to auscultation without rales, wheezing or rhonchi  ABDOMEN: Soft, non-tender, non-distended EXTREMITIES:  No edema; No deformity. Bilateral groin puncture site with no ecchymosis nor hematoma.   ASSESSMENT AND PLAN: .    Persistent atiral fibrillation s/p ablation / High risk medication us  / Hypercoagulable state / Atrial  tach s/p ablation 04/28/23 - Maintaining NSR by auscultation. Continue Eliquis  5mg  BID, Toprol  50mg  daily. She self reduced dose of Toprol  to 50mg  and feels better on this dose, will make EP aware. Does not meet dose reduction criteria for Eliquis .  CHA2DS2-VASc Score = 5 [CHF History: 1, HTN History: 1, Diabetes History: 0, Stroke History: 0, Vascular Disease History: 0, Age Score: 2, Gender Score: 1].  Therefore, the patient's annual risk of stroke is 7.2 %.       HTN - BP controlled in clinic and at home. Continue pesent regimen Diltiazem  120mg  daily, Toprol  50mg  daily, Hydralazine  25mg   BID, Valsartan  160mg  BID. Previously felt poorly with overcorrection of BP. Refills provided.    MS s/p MVR / LE edema - Continue SBE prophylaxis. Echo 03/2023 normal function of MV prosthesis. Euvolemic on exam. Continue Lasix  20mg  PRN. Discussed if ineffective in future, could trial higher dose or Torsemide.    Bilateral carotid stenosis - 09/2018 bilateral 1-39% stenosis.12/08/21 bilateral 1-39% stenosis. Duplex 11/2022 with no significant carotid stenosis and elevated velocities due to tortuosity recommended for repeat duplex in 2 years. Continue Atorvastatin 10mg  daily.   BMI 31 - encouraged seated exercises so as to not exacerbate hip pain. Handout provided. Weight loss via diet and exercise encouraged. Discussed the impact being overweight would have on cardiovascular risk.        Dispo: follow up with Dr. Raford or APP in 4-6 mos  Signed, Reche GORMAN Finder, NP

## 2023-08-26 LAB — LAB REPORT - SCANNED
A1c: 5.3
EGFR: 65

## 2023-08-30 ENCOUNTER — Encounter (HOSPITAL_BASED_OUTPATIENT_CLINIC_OR_DEPARTMENT_OTHER): Payer: Self-pay

## 2023-08-30 NOTE — Telephone Encounter (Signed)
 Labs reviewed in [facility] 08/29/23:  total cholesterol 128, HDL 47, LDL 56, triglycerides 142 creatinine 0.9, GFR 65, K 5.5, Na 142, AST 17, ALT 15 A1c 5.3  Reche GORMAN Finder, NP

## 2023-09-14 ENCOUNTER — Other Ambulatory Visit: Payer: Self-pay | Admitting: Cardiology

## 2023-09-14 DIAGNOSIS — I4819 Other persistent atrial fibrillation: Secondary | ICD-10-CM

## 2023-09-14 NOTE — Telephone Encounter (Addendum)
 Prescription refill request for Eliquis  received. Indication: AF Last office visit: 08/22/23  JAYSON Finder NP Scr: 0.88 on 05/26/23  Epic Age: 78 Weight: 74.2kg  Based on above findings Eliquis  5mg  twice daily is the appropriate dose.  Refill approved.

## 2023-10-18 ENCOUNTER — Ambulatory Visit: Admitting: Physician Assistant

## 2023-10-30 NOTE — Progress Notes (Unsigned)
 Cardiology Office Note Date:  10/30/2023  Patient ID:  Mary Farrell, Mary Farrell 03-25-1945, MRN [id number] PCP:  Okey Carlin Redbird, MD  Cardiologist:  Dr. Raford Electrophysiologist: Dr. Inocencio    Chief Complaint:     planned f/u  History of Present Illness: Mary Farrell is a 78 y.o. 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 Inocencio 09/09/22, unfortunately recurrent palpitations, mentions Aflutter, ER visit with DCCV Discussed options Repeat ablation Changing to amiodarone  She was going to give these options some thought  I saw her 10/19/22 She reports since starting Diltiazem  (she reports starting after weaaring the monitor/being in the ER) her AFib/flutter seems to have simmered down She would really like to avoid another procedure as well as amiodarone  When not in AF she feels well No CP, SOB No near syncope or syncope Reports good medication compliance No bleeding or signs of bleeding Stable EKG, improved burden Pt hoping to avoid amiodarone /ablation   ER visit 12/05/22, reported hit on the head by a TV antenna followed by nausea > CT negative, discharged from the ER, pt suspected nausea  2/2 cake she had eaten   I saw her 12/20/22 She had her hip injected las week Wed., that evening/overnight she woke about 4AM with a discomfort/pressure in her epigastrium/central chest.  She got up and checked her BP was OK, HR was OK Did not feel like Afib, suspected her GERD (given no arm, jaw pain or other symptoms), unfortunately no tums available. Eventually just settled away and has not happened again Though for a few days after the injection her HR  unusually in the 70's (her usual in the 50's, and seems as it settled back to her baseline, she feels tired, no energy, sluggish No symptoms of her AFib since our last visit Last week she did have a slight but steady weight gain maybe a little breathless or wheeze and took her PRN lasix  with quick return to her baseline weight and resolution of her wheeze. Timing unclear but thinks noted pre-injection (steroid). No near syncope or syncope No bleeding or signs of bleeding ? If symptoms 2/2 bradycardia Might consider stopping her coreg ? PLAN: Discussed her symptoms a length today EKG without acute changes No ongoing symptoms Planned to f/u w/Dr. Raford >> w/u as she felt indicated  Saw Dr. Raford 12/31/22. Jardiance  added for her HFpEF, echo in a year   I saw her 03/22/23 Had UTI > yeast infection > Jardiance  stopped and advised cards follow up Had been given and taken phenergan  Tikosyn  teaching revisit, continued  04/22/23 admitted with an SVT, initially planned to discharge from the hospital but became recurrent and admitted EP saw her felt to be an atrial tachycardia Coreg  >> metoprolol  Dilt increased Suspected she may need pacer/AV node ablation  Brought back 04/28/23 > found a left sided ATach > ablated (no pacer)  She saw cards team 05/06/23, frustrated with hip pain, reduced stamina, out of shape.doing well otherwise, no changes were made  I saw her 05/26/23: She had a slight and brief sense of palpitation this morning, none otherwise Though she is tired, and winded No  CP No near syncope or syncope but a sense of feeling lightheaded a lot Her husband has some dementia (he denies it) and this seems to be advancing, generally does ok but of late moments of aggression that are really anxiety provoking She worries a lot about him, his (their) future. Suspect symptoms 2/2 bradycardia and her BB reduced   I saw her 08/05/23 She is accompanied by her husband She is doing OK, has  had a few fleeting palpitations, none sustained, no tachycardia She again asks about stopping Tikosyn  2/2 medication interactions, inability to use anything helpful for nausea, and very much wants to get back on her hydrochlorothiazide , worries about times she needs antibiotics as well. No near syncope or syncope Her Tikosyn  stopped  She saw her cards team 08/22/23, HR variable with exertion, not symptoms of arrhythmias Doing well, no changes were made  TODAY She is accompanied by her husband. She feels well! Infrequent and fleeting palpitations Feels better off Tikosyn , certainly mentally feels relieved to be off it, Mentions a rare central, epigastric pressure, not positional or exertional. Not new, has mentioned/had before (Normal cors in 2020) No near syncope or syncope No bleeding, signs of bleeding   AFib/AAD Amiodarone  not tolerated 2/2 GI  Flecainide  2017 >> failed and had PR prolongation PVI ablation 03/10/2017 CTI/PVI Ablation May 2019 MAZE 2020 Tikosyn  Jan 2021 >> stopped May 2025 patient preference PVI ablation 02/23/22 04/28/23: EPS/ablation: Ablation of atrial tachycardia around the left atrial appendage   Past Medical History:  Diagnosis Date   Anxiety 06/29/2012   Aortic regurgitation 08/13/2015   Arthritis    hands, wrists, back, feet, toes (06/24/2017)   Atrial flutter (HCC) 09/18/2015   Atypical chest pain 05/13/2016   Chest pain    remote cath in 1996 with NORMAL coronaries noted   CHF (congestive heart failure) (HCC)    Dyspnea 10/30/2010   Essential hypertension 09/05/2013   Exogenous obesity    history of    GERD (gastroesophageal reflux disease)    Glaucoma, both eyes    Headache, migraine    stopped in my 50's (09/18/2015)   History of cardiovascular stress test 2007   showing no ischemia   Hypertension    Mitral stenosis and aortic insufficiency 06/23/2010   Mitral stenosis with insufficiency    Mitral stenosis with regurgitation    Murmur    of  mitral stenosis and moderate aortic insufficiency       Nausea 05/01/2018   Obesity (BMI 30.0-34.9) 02/13/2021   OSA on CPAP    Palpitations    occasional   Paroxysmal A-fib (HCC) 06/24/2017   Pericardial effusion 09/18/2015   Persistent atrial fibrillation (HCC) 03/10/2017   Personal history of rheumatic heart disease    S/P minimally-invasive maze operation for atrial fibrillation 12/13/2018   Complete bilateral atrial lesion set using cryothermy and bipolar radiofrequency ablation with clipping of LA appendage via right mini-thoracotomy approach   S/P minimally-invasive mitral valve replacement with bioprosthetic valve 12/13/2018   31 mm [facility] Mitral stented bovine pericardial tissue valve   SBE (subacute bacterial endocarditis)    prophylaxis, patient unaware   Sliding hiatal hernia    Stress 02/13/2021   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   Vertigo 05/01/2018    Past Surgical History:  Procedure Laterality Date   ABDOMINAL HYSTERECTOMY  1975   APPENDECTOMY  1977   ATRIAL FIBRILLATION ABLATION N/A 03/10/2017   Procedure: ATRIAL FIBRILLATION ABLATION;  Surgeon: Inocencio Soyla Lunger, MD;  Location: [facility] INVASIVE  CV LAB;  Service: Cardiovascular;  Laterality: N/A;   ATRIAL FIBRILLATION ABLATION  06/24/2017   ATRIAL FIBRILLATION ABLATION N/A 06/24/2017   Procedure: ATRIAL FIBRILLATION ABLATION;  Surgeon: Inocencio Soyla Lunger, MD;  Location: [facility] INVASIVE CV LAB;  Service: Cardiovascular;  Laterality: N/A;   ATRIAL FIBRILLATION ABLATION N/A 02/23/2022   Procedure: ATRIAL FIBRILLATION ABLATION;  Surgeon: Inocencio Soyla Lunger, MD;  Location: [facility] INVASIVE CV LAB;  Service: Cardiovascular;  Laterality: N/A;   ATRIAL TACH ABLATION N/A 04/28/2023   Procedure: ATRIAL TACH ABLATION;  Surgeon: Inocencio Soyla Lunger, MD;  Location: [facility] INVASIVE CV LAB;  Service: Cardiovascular;  Laterality: N/A;   AV NODE ABLATION N/A 04/28/2023   Procedure: AV NODE ABLATION;  Surgeon: Inocencio Soyla Lunger,  MD;  Location: [facility] INVASIVE CV LAB;  Service: Cardiovascular;  Laterality: N/A;   BUNIONECTOMY Right 2000s   CARDIAC CATHETERIZATION  01/13/1995   normal coronary anatomy and mild mitral stenosis and mild pulmonary hypertension   CARDIOVERSION N/A 09/22/2015   Procedure: CARDIOVERSION;  Surgeon: Oneil JAYSON Parchment, MD;  Location: [facility] ENDOSCOPY;  Service: Cardiovascular;  Laterality: N/A;   CARDIOVERSION N/A 10/25/2016   Procedure: CARDIOVERSION;  Surgeon: Okey Vina GAILS, MD;  Location: [facility] ENDOSCOPY;  Service: Cardiovascular;  Laterality: N/A;   CARDIOVERSION N/A 04/15/2017   Procedure: CARDIOVERSION;  Surgeon: Delford Maude JAYSON, MD;  Location: [facility] ENDOSCOPY;  Service: Cardiovascular;  Laterality: N/A;   CARDIOVERSION N/A 05/16/2017   Procedure: CARDIOVERSION;  Surgeon: Mona Vinie JAYSON, MD;  Location: [facility] ENDOSCOPY;  Service: Cardiovascular;  Laterality: N/A;   CARDIOVERSION N/A 01/24/2019   Procedure: CARDIOVERSION;  Surgeon: Mona Vinie JAYSON, MD;  Location: [facility] ENDOSCOPY;  Service: Cardiovascular;  Laterality: N/A;   CARDIOVERSION N/A 10/14/2021   Procedure: CARDIOVERSION;  Surgeon: Kate Lonni CROME, MD;  Location: [facility] ENDOSCOPY;  Service: Cardiovascular;  Laterality: N/A;   CARDIOVERSION N/A 11/20/2021   Procedure: CARDIOVERSION;  Surgeon: Okey Vina GAILS, MD;  Location: [facility] ENDOSCOPY;  Service: Cardiovascular;  Laterality: N/A;   CATARACT EXTRACTION W/ INTRAOCULAR LENS  IMPLANT, BILATERAL Bilateral 2013   COLONOSCOPY  2013   EYE SURGERY Bilateral    cataracts   LAPAROSCOPIC CHOLECYSTECTOMY  1997   MINIMALLY INVASIVE MAZE PROCEDURE N/A 12/13/2018   Procedure: MINIMALLY INVASIVE MAZE PROCEDURE using a 45 MM AtriClip.;  Surgeon: Dusty Sudie DEL, MD;  Location: [facility] OR;  Service: Open Heart Surgery;  Laterality: N/A;   MITRAL VALVE REPLACEMENT Right 12/13/2018   Procedure: MINIMALLY INVASIVE MITRAL VALVE (MV) REPLACEMENT using a Magna Mitral Ease 31 MM Valve.;  Surgeon: Dusty Sudie DEL, MD;  Location: [facility]  OR;  Service: Open Heart Surgery;  Laterality: Right;   PACEMAKER IMPLANT N/A 04/28/2023   Procedure: PACEMAKER IMPLANT;  Surgeon: Inocencio Soyla Lunger, MD;  Location: [facility] INVASIVE CV LAB;  Service: Cardiovascular;  Laterality: N/A;   RIGHT/LEFT HEART CATH AND CORONARY ANGIOGRAPHY N/A 10/18/2018   Procedure: RIGHT/LEFT HEART CATH AND CORONARY ANGIOGRAPHY;  Surgeon: Anner Alm ORN, MD;  Location: [facility] INVASIVE CV LAB;  Service: Cardiovascular;  Laterality: N/A;   TEE WITHOUT CARDIOVERSION N/A 06/23/2017   Procedure: TRANSESOPHAGEAL ECHOCARDIOGRAM (TEE);  Surgeon: Okey Vina GAILS, MD;  Location: [facility] ENDOSCOPY;  Service: Cardiovascular;  Laterality: N/A;   TEE WITHOUT CARDIOVERSION N/A 10/17/2018   Procedure: TRANSESOPHAGEAL ECHOCARDIOGRAM (TEE);  Surgeon: Parchment Oneil JAYSON, MD;  Location: [facility] ENDOSCOPY;  Service: Cardiovascular;  Laterality: N/A;   TEE WITHOUT CARDIOVERSION N/A 12/13/2018   Procedure: TRANSESOPHAGEAL ECHOCARDIOGRAM (TEE);  Surgeon: Dusty Sudie DEL, MD;  Location: [facility] OR;  Service: Open Heart Surgery;  Laterality: N/A;   TONSILLECTOMY AND ADENOIDECTOMY  1990s   UVULOPALATOPHARYNGOPLASTY, TONSILLECTOMY AND SEPTOPLASTY  1990s    Current Outpatient Medications  Medication Sig Dispense Refill   acetaminophen  (TYLENOL ) 500 MG tablet Take 500 mg by mouth daily as needed for moderate pain.     ALPRAZolam  (XANAX ) 0.25 MG tablet Take 0.125-0.25 mg by mouth See admin instructions. Take 0.25 mg at night, may take 0.25 mg dose as needed for anxiety during the day     apixaban  (ELIQUIS ) 5 MG TABS tablet TAKE 1 TABLET BY MOUTH TWICE  DAILY 180 tablet 1   atorvastatin (LIPITOR) 10 MG tablet Take 10 mg by mouth 2 (two) times a week.     Calcium  Citrate-Vitamin D  (CALCIUM  + D PO) Take 1 tablet by mouth daily.     cetirizine (ZYRTEC) 10 MG tablet Take 10 mg by mouth at bedtime.     Cholecalciferol  (VITAMIN D ) 50 MCG (2000 UT) tablet Take 2,000 Units by mouth daily. (Patient taking differently: Take 2,000 Units  by mouth 2 (two) times a week.)     cyanocobalamin  (VITAMIN B12) 1000 MCG tablet Take 1,000 mcg by mouth daily. (Patient taking differently: Take 1,000 mcg by mouth 2 (two) times a week.)     diltiazem  (CARDIZEM  CD) 120 MG 24 hr capsule Take 1 capsule (120 mg total) by mouth at bedtime. 90 capsule 3   diltiazem  (CARDIZEM ) 30 MG tablet Take 1 tablet (30 mg total) by mouth 4 (four) times daily as needed (for heart rates greater than 100). 60 tablet 3   dorzolamide -timolol  (COSOPT ) 22.3-6.8 MG/ML ophthalmic solution Place 1 drop into both eyes 2 (two) times daily.     fluticasone (FLONASE) 50 MCG/ACT nasal spray Place 1 spray into both nostrils daily.     furosemide  (LASIX ) 20 MG tablet Take 1 tablet (20 mg total) by mouth daily as needed for fluid. For >3lb/overnight or >5lb/weekly note dose 30 tablet 4   gabapentin  (NEURONTIN ) 300 MG capsule Take 300 mg by mouth 2 (two) times daily.     hydrALAZINE  (APRESOLINE ) 25 MG tablet TAKE 1 TABLET (25 MG) BY MOUTH IN THE MORNING AND AT BEDTIME 180 tablet 3   latanoprost  (XALATAN ) 0.005 % ophthalmic solution Place 1 drop into both eyes at bedtime.   6   Lidocaine -Glycerin  (PREPARATION H EX) Apply 1 Application topically daily as needed (hemorrhoids).     magnesium  oxide (MAG-OX) 400 (240 Mg) MG tablet Take 1 tablet (400 mg total) by mouth every other day.     meclizine  (ANTIVERT ) 25 MG tablet Take 25 mg by mouth as needed for dizziness or nausea.     metoprolol  succinate (TOPROL -XL) 50 MG 24 hr tablet Take 1 tablet (50 mg total) by mouth daily. Take with or immediately following a meal. May take an additional half tablet as needed for breakthrough palpitations. 135 tablet 3   Multiple Vitamin (MULTIVITAMIN WITH MINERALS) TABS tablet Take 1 tablet by mouth daily.     NON FORMULARY Apply 1 application topically 2 (two) times daily as needed (for eczema). Traimcinolone/[employer] Moist Cream     oxybutynin  (DITROPAN -XL) 10 MG 24 hr tablet Take 10 mg by mouth daily.  (Patient taking differently: Take 10 mg by mouth daily as needed (symptoms).)     pantoprazole  (PROTONIX ) 40 MG tablet Take 40 mg by mouth daily. (Patient taking differently: Take 40 mg by mouth 2 (two) times daily.)     polyethylene glycol (MIRALAX  / GLYCOLAX ) 17  g packet Take 17 g by mouth every other day.     potassium chloride  (KLOR-CON ) 10 MEQ tablet Take 1 tablet (10 mEq total) by mouth daily as needed (when you take Lasix ). 30 tablet 3   PRESCRIPTION MEDICATION Inhale into the lungs at bedtime. CPAP     Probiotic Product (PROBIOTIC PO) Take 1 capsule by mouth every evening.     sodium chloride  (OCEAN) 0.65 % SOLN nasal spray Place 1 spray into both nostrils at bedtime.     valsartan  (DIOVAN ) 160 MG tablet TAKE 1 TABLET BY MOUTH TWICE  DAILY 180 tablet 3   YUVAFEM  10 MCG TABS vaginal tablet Place 10 mcg vaginally daily.     No current facility-administered medications for this visit.    Allergies:   Jardiance  [empagliflozin ], Tizanidine, Amoxicillin, Metoprolol  tartrate, and Oxaprozin   Social History:  The patient  reports that she has never smoked. She has never used smokeless tobacco. She reports current alcohol use. She reports that she does not use drugs.   Family History:  The patient's family history includes Diabetes in her brother; Heart attack in her mother; Heart failure in her maternal grandfather; Hypertension in her brother; Kidney disease in her brother.  ROS:  Please see the history of present illness.    All other systems are reviewed and otherwise negative.   PHYSICAL EXAM:  VS:  There were no vitals taken for this visit. BMI: There is no height or weight on file to calculate BMI. Well nourished, well developed, in no acute distress HEENT: normocephalic, atraumatic Neck: no JVD, carotid bruits or masses Cardiac: RRR; no significant murmurs, no rubs, or gallops Lungs: CTA b/l, no wheezing, rhonchi or rales Abd: soft, nontender MS: no deformity or atrophy Ext: no  edema Skin: warm and dry, no rash Neuro:  No gross deficits appreciated Psych: euthymic mood, full affect   EKG:  not done today  04/28/23: EPS/ablation CONCLUSIONS: 1.  Sinus rhythm upon presentation.   2.  EP study with induction of left atrial tachycardia 3.  Ablation of atrial tachycardia around the left atrial appendage  4. No early apparent complications.    04/25/23: TTE 1. Left ventricular ejection fraction, by estimation, is 60 to 65%. The  left ventricle has normal function. The left ventricle has no regional  wall motion abnormalities. Left ventricular diastolic function could not  be evaluated.   2. Right ventricular systolic function is normal. The right ventricular  size is normal. There is normal pulmonary artery systolic pressure. The  estimated right ventricular systolic pressure is 29.2 mmHg.   3. Left atrial size was severely dilated.   4. Right atrial size was moderately dilated.   5. The mitral valve has been repaired/replaced. No evidence of mitral  valve regurgitation. No evidence of mitral stenosis. The mean mitral valve  gradient is 5.0 mmHg with average heart rate of 92 bpm. There is a 31 mm  Edwards bioprosthetic valve present   in the mitral position. Echo findings are consistent with normal  structure and function of the mitral valve prosthesis.   6. Tricuspid valve regurgitation is mild to moderate.   7. The aortic valve is tricuspid. Aortic valve regurgitation is mild. No  aortic stenosis is present.   8. The inferior vena cava is dilated in size with >50% respiratory  variability, suggesting right atrial pressure of 8 mmHg.   Comparison(s): No significant change from prior study. Prior images  reviewed side by side. Mitral valve prosthesis gradients  are higher mostly  due to faster heart rate, although pressure half time has also lengthened  slightly, suggesting there may also  be a mild reduction in the effective orifice area.   Aug 2024  monitor Patient had a min HR of 37 bpm, max HR of 131 bpm, and avg HR of 59 bpm.  Predominant underlying rhythm was Sinus Rhythm.  Atrial Flutter/SVT occurred (13% burden), ranging from 39-131 bpm (avg of 81 bpm), the longest 1 day 20 hours with an avg rate of 81 bpm.  Triggered episodes associated with atrial flutter/SVT Less than 1% ventricular and supraventricular ectopy   12/07/21: TTE 1. Left ventricular ejection fraction, by estimation, is 55 to 60%. Left  ventricular ejection fraction by 3D volume is 55 %. The left ventricle has  normal function. The left ventricle has no regional wall motion  abnormalities. Left ventricular diastolic   function could not be evaluated. Elevated left ventricular end-diastolic  pressure. The average left ventricular global longitudinal strain is -16.6  %. The global longitudinal strain is normal.   2. Right ventricular systolic function is normal. The right ventricular  size is normal. There is normal pulmonary artery systolic pressure. The  estimated right ventricular systolic pressure is 25.8 mmHg.   3. Left atrial size was mildly dilated.   4. The mitral valve has been repaired/replaced. No evidence of mitral  valve regurgitation. No evidence of mitral stenosis. The mean mitral valve  gradient is 3.0 mmHg at a HR of 52bpm. Echo findings are consistent with  normal structure and function of  the mitral valve prosthesis.   5. The aortic valve is tricuspid. There is moderate calcification of the  aortic valve. There is moderate thickening of the aortic valve. Aortic  valve regurgitation is mild. Aortic valve sclerosis/calcification is  present, without any evidence of aortic  stenosis. Aortic regurgitation PHT measures 448 msec.   6. Pulmonic valve regurgitation is moderate.   7. The inferior vena cava is normal in size with greater than 50%  respiratory variability, suggesting right atrial pressure of 3 mmHg.    09/24/20: TTE IMPRESSIONS    1. Left ventricular ejection fraction, by estimation, is 60 to 65%. The  left ventricle has normal function. The left ventricle has no regional  wall motion abnormalities. Left ventricular diastolic parameters are  indeterminate.   2. Right ventricular systolic function is normal. The right ventricular  size is normal. There is moderately elevated pulmonary artery systolic  pressure.   3. Left atrial size was moderately dilated.   4. The mitral valve has been repaired/replaced. Mild mitral valve  regurgitation. No evidence of mitral stenosis. The mean mitral valve  gradient is 2.8 mmHg with average heart rate of 55 bpm. Echo findings are  consistent with normal structure and  function of the mitral valve prosthesis.   5. The aortic valve is tricuspid. There is mild calcification of the  aortic valve. There is mild thickening of the aortic valve. Aortic valve  regurgitation is mild. Mild aortic valve sclerosis is present, with no  evidence of aortic valve stenosis.  Aortic valve area, by VTI measures 2.29 cm. Aortic valve mean gradient  measures 6.5 mmHg. Aortic valve Vmax measures 1.69 m/s.   6. The inferior vena cava is normal in size with greater than 50%  respiratory variability, suggesting right atrial pressure of 3 mmHg.    TTE 01/23/19: 1. Left ventricular ejection fraction, by visual estimation, is 55 to  60%. The left ventricle  has normal function. Left ventricular septal wall  thickness was mildly increased. Moderately increased left ventricular  posterior wall thickness. There is  mildly increased left ventricular hypertrophy.   2. Left ventricular diastolic parameters are indeterminate.   3. The left ventricle demonstrates regional wall motion abnormalities.   4. Elevated LVEDP. Hypokinesis of the basal septum consistent with  post-operative state.   5. Global right ventricle has normal systolic function.The right  ventricular size is normal. No increase in right  ventricular wall  thickness.   6. Left atrial size was severely dilated.   7. Right atrial size was normal.   8. The mitral valve has been repaired/replaced. No evidence of mitral  valve regurgitation. No evidence of mitral stenosis.   9. The tricuspid valve is normal in structure.  10. The aortic valve is tricuspid. Aortic valve regurgitation is mild. No  evidence of aortic valve sclerosis or stenosis.  11. The pulmonic valve was normal in structure. Pulmonic valve  regurgitation is not visualized.  12. Normal pulmonary artery systolic pressure.  13. The inferior vena cava is normal in size with <50% respiratory  variability, suggesting right atrial pressure of 8 mmHg.      10/18/2018: LHC Hemodynamic findings consistent with mild pulmonary hypertension. LV end diastolic pressure is normal. There is severe mitral valve stenosis -suggested by a echocardiogram, large PCWP V waves along with elevated PCWP but normal LVEDP would also suggest this. Angiographically normal coronary arteries   SUMMARY Angiographically normal coronary arteries Mild pulmonary hypertension with mildly elevated PCWP but normal LVEDP suggestive of mitral stenosis Significant V wave noted on PCWP waveform suggesting mitral stenosis.   RECOMMENDATIONS Return to nursing unit for ongoing care.   Proceed with plans for mitral valve replacement   06/24/2017 EPS/ablation CONCLUSIONS: 1. Sinus rhythm upon presentation.   2. Successful electrical isolation and anatomical encircling of all four pulmonary veins with radiofrequency current.    3. Cavo-tricuspid isthmus ablation was performed with complete bidirectional isthmus block achieved.  4. No inducible arrhythmias following ablation both on and off of dobutamine  5. No early apparent complications.   03/10/2017: EPS/ablation CONCLUSIONS: 1. Atrial fibrillation upon presentation.   2. Successful electrical isolation and anatomical encircling of all four  pulmonary veins with radiofrequency current.  A WACA approach was used 3. Additional left atrial ablation was performed with a left atrial roof line  4. Atrial fibrillation successfully cardioverted to sinus rhythm. 5. No early apparent complications.     Recent Labs: 01/18/2023: ALT 22 04/21/2023: Hemoglobin 13.9; Platelets 237 04/25/2023: Magnesium  2.0 05/26/2023: BUN 11; Creatinine, Ser 0.88; Potassium 4.5; Sodium 140  No results found for requested labs within last 365 days.   CrCl cannot be calculated (Patient's most recent lab result is older than the maximum 21 days allowed.).   Wt Readings from Last 3 Encounters:  08/22/23 163 lb 8 oz (74.2 kg)  08/05/23 163 lb (73.9 kg)  05/26/23 166 lb (75.3 kg)     Other studies reviewed: Additional studies/records reviewed today include: summarized above  ASSESSMENT AND PLAN:  1. Persistent AFib 2. SVT 3. AFlutter CHA2DS2Vasc is 4, on Eliquis , appropriately dosed 4.  ATach      No symptoms of arrhythmia off Tikosyn . Recent labs look good    5. MVR (bioprosthetic)     Functioning well on echo 2025 6. Chronic pericardial effusion      None noted echo 2025     C/w Dr. Raford  7. HTN  Looks ok  8. Secondary hypercoagulable state   Disposition: offered an annual visit, she would like to check in, in 62mo, sooner if needed   Current medicines are reviewed at length with the patient today.  The patient did not have any concerns regarding medicines.  Bonney Charlies Arthur, PA-C 10/30/2023 8:34 AM     [facility] [address] [place] [address] [phone] (office)  [phone] (fax)

## 2023-10-31 ENCOUNTER — Ambulatory Visit: Attending: Physician Assistant | Admitting: Physician Assistant

## 2023-10-31 ENCOUNTER — Encounter: Payer: Self-pay | Admitting: Physician Assistant

## 2023-10-31 VITALS — BP 130/82 | HR 73 | Ht 60.0 in | Wt 163.0 lb

## 2023-10-31 DIAGNOSIS — I4819 Other persistent atrial fibrillation: Secondary | ICD-10-CM | POA: Diagnosis not present

## 2023-10-31 DIAGNOSIS — I4719 Other supraventricular tachycardia: Secondary | ICD-10-CM | POA: Diagnosis not present

## 2023-10-31 DIAGNOSIS — I10 Essential (primary) hypertension: Secondary | ICD-10-CM | POA: Diagnosis not present

## 2023-10-31 DIAGNOSIS — Z952 Presence of prosthetic heart valve: Secondary | ICD-10-CM

## 2023-10-31 DIAGNOSIS — D6869 Other thrombophilia: Secondary | ICD-10-CM

## 2023-10-31 NOTE — Patient Instructions (Signed)
 Medication Instructions:    Your physician recommends that you continue on your current medications as directed. Please refer to the Current Medication list given to you today.   *If you need a refill on your cardiac medications before your next appointment, please call your pharmacy*    Lab Work:  NONE ORDERED  TODAY     If you have labs (blood work) drawn today and your tests are completely normal, you will receive your results only by: MyChart Message (if you have MyChart) OR A paper copy in the mail If you have any lab test that is abnormal or we need to change your treatment, we will call you to review the results.   Testing/Procedures:    Follow-Up: At [facility], you and your health needs are our priority.  As part of our continuing mission to provide you with exceptional heart care, our providers are all part of one team.  This team includes your primary Cardiologist (physician) and Advanced Practice Providers or APPs (Physician Assistants and Nurse Practitioners) who all work together to provide you with the care you need, when you need it.   Your next appointment:    6 MONTHS     Provider:    You may see Will Gladis Norton, MD or   Charlies Arthur, PA-C    We recommend signing up for the patient portal called MyChart.  Sign up information is provided on this After Visit Summary.  MyChart is used to connect with patients for Virtual Visits (Telemedicine).  Patients are able to view lab/test results, encounter notes, upcoming appointments, etc.  Non-urgent messages can be sent to your provider as well.   To learn more about what you can do with MyChart, go to [email or web address].   Other Instructions

## 2023-11-20 ENCOUNTER — Telehealth: Payer: Self-pay | Admitting: Physician Assistant

## 2023-11-20 ENCOUNTER — Other Ambulatory Visit (HOSPITAL_BASED_OUTPATIENT_CLINIC_OR_DEPARTMENT_OTHER): Payer: Self-pay | Admitting: Family

## 2023-11-20 DIAGNOSIS — I10 Essential (primary) hypertension: Secondary | ICD-10-CM

## 2023-11-20 NOTE — Telephone Encounter (Signed)
   The patient called the answering service after-hours today. Complex arrhythmia hx followed by EP.  Previously on Tikosyn , discontinued this summer. Had been doing OK, HR usually 50s-60s.  She takes Toprol  50mg  nightly and diltiazem  120mg  nightly. This morning she felt like her heart was going faster. Pulse ox read HR 92bpm. She got up and did a Kardia reporting HR 100 NSR. This AM she took another 100mg  metoprolol  as she recalls being told she could take an extra 50mg  and she figured that 100mg  wouldn't hurt. Within an hour or so she then noticed HR running 40s. However, in the last hour HR back up to 95. She feels mildly fatigued but no acute symptoms. Her husband encouraged her to go to the ER. I told her given her complex hx of arrhythmias and back and forth HR this is not something easily managed over the phone, welcomed to come to ER if feeling poorly. I told her if she opts not to go to the ER, then I would not dose any additional extra metoprolol  or diltiazem  beyond her normal scheduled regimen because we do not want to swing her HR too low, and that HR 95-100 is typically not acutely dangerous unless reactive to another process for which we are simply unable to evaluate over the phone. The patient is contemplating coming to get checked out but would like for me to send to Dr. Inocencio and Charlies Arthur for review/advisement tomorrow if she decides she feels well enough to observe at home.  Mary Sequeira N Joy Reiger, PA-C

## 2023-11-22 NOTE — Telephone Encounter (Signed)
 Spoke with patient. She would prefer to try using extra 25mg  of metoprolol  for elevated rates in the morning for a few days to see if this will settle her rhythm out. She is in NSR this morning with heart rates in the 50s; pt understands she would only take an extra 25mg  of metoprolol  if her rates were to consistently be elevated. Pt to call back if rates continue to be a problem.

## 2023-12-16 ENCOUNTER — Encounter (HOSPITAL_BASED_OUTPATIENT_CLINIC_OR_DEPARTMENT_OTHER): Payer: Self-pay | Admitting: Cardiovascular Disease

## 2023-12-16 ENCOUNTER — Ambulatory Visit (HOSPITAL_BASED_OUTPATIENT_CLINIC_OR_DEPARTMENT_OTHER): Admitting: Cardiovascular Disease

## 2023-12-16 VITALS — BP 126/60 | HR 66 | Ht 59.0 in

## 2023-12-16 DIAGNOSIS — I4819 Other persistent atrial fibrillation: Secondary | ICD-10-CM | POA: Diagnosis not present

## 2023-12-16 DIAGNOSIS — I495 Sick sinus syndrome: Secondary | ICD-10-CM

## 2023-12-16 DIAGNOSIS — I10 Essential (primary) hypertension: Secondary | ICD-10-CM | POA: Diagnosis not present

## 2023-12-16 DIAGNOSIS — Z953 Presence of xenogenic heart valve: Secondary | ICD-10-CM

## 2023-12-16 DIAGNOSIS — Z9889 Other specified postprocedural states: Secondary | ICD-10-CM

## 2023-12-16 DIAGNOSIS — I4892 Unspecified atrial flutter: Secondary | ICD-10-CM

## 2023-12-16 DIAGNOSIS — Z636 Dependent relative needing care at home: Secondary | ICD-10-CM | POA: Insufficient documentation

## 2023-12-16 DIAGNOSIS — I3139 Other pericardial effusion (noninflammatory): Secondary | ICD-10-CM

## 2023-12-16 DIAGNOSIS — I351 Nonrheumatic aortic (valve) insufficiency: Secondary | ICD-10-CM | POA: Diagnosis not present

## 2023-12-16 DIAGNOSIS — I471 Supraventricular tachycardia, unspecified: Secondary | ICD-10-CM

## 2023-12-16 DIAGNOSIS — Z8679 Personal history of other diseases of the circulatory system: Secondary | ICD-10-CM

## 2023-12-16 DIAGNOSIS — E66811 Obesity, class 1: Secondary | ICD-10-CM

## 2023-12-16 NOTE — Patient Instructions (Signed)
 Medication Instructions:  Your physician recommends that you continue on your current medications as directed. Please refer to the Current Medication list given to you today.  *If you need a refill on your cardiac medications before your next appointment, please call your pharmacy*  Lab Work: NONE  Testing/Procedures: NONE  Follow-Up: At [facility], you and your health needs are our priority.  As part of our continuing mission to provide you with exceptional heart care, we have created designated Provider Care Teams.  These Care Teams include your primary Cardiologist (physician) and Advanced Practice Providers (APPs -  Physician Assistants and Nurse Practitioners) who all work together to provide you with the care you need, when you need it.  We recommend signing up for the patient portal called MyChart.  Sign up information is provided on this After Visit Summary.  MyChart is used to connect with patients for Virtual Visits (Telemedicine).  Patients are able to view lab/test results, encounter notes, upcoming appointments, etc.  Non-urgent messages can be sent to your provider as well.   To learn more about what you can do with MyChart, go to [email or web address].    Your next appointment:   6 month(s)  The format for your next appointment:   In Person  Provider:   Reche ORN NP   1 year Dr Raford   Will have Marit our social worker reach out to you

## 2023-12-16 NOTE — Progress Notes (Signed)
 Cardiology Office Note:  .   Date:  12/16/2023  ID:  Mary Farrell, DOB 1945/07/14, MRN [id number] PCP: Okey Carlin Redbird, MD  [facility] Providers Cardiologist:  Annabella Scarce, MD Electrophysiologist:  Will Gladis Norton, MD    History of Present Illness: Mary Farrell is a 78 y.o. female with moderate aortic regurgitation, Rheumatic mitral stenosis s/p MVR, chronic pericardial effusion, paroxysmal atrial flutter s/p ablation 05/2017, MAZE 11/2018 and hypertension who presents for follow up.  She was referred for an echo 04/2015 that revealed LVEF 55-60% with moderate AR and mild-moderate MS.  There was also a moderate pericardial effusion but no evidence of tamponade.  She had a heart cath 12/1994 that revealed normal coronaries with mild MS and mild pulmonary hypertension.  She was seen in clinic 07/2015 and reported persistent shortness of breath with minimal exertion and chest discomfort.  She was referred for exercise Myoview  08/26/15 that revealed LVEF 60% and no perfusion defects.    Mary Farrell was admitted 08/2015 with atrial flutter.  She had an echo that showed a persistent moderate-severe pericardial effusion but no tamponade.  She was started on amiodarone , which she did not tolerate due to nausea.  She underwent DCCV on 09/22/15 and then was started on flecainide .  She continued to report shortness of breath and was again referred for an echo that revealed LVEF 60-65% with grade 2 diastolic dysfunction, mild aortic regurgitation, and moderate mitral stenosis. She had a moderate pericardial effusion localized to the inferior and inferolateral walls. There was no evidence of tamponade.   Mary Farrell had an outpatient DCCV on 10/1. She developed recurrent atrial fibrillation and was referred to EP.  She underwent afib/flutter ablation with Dr. Norton on 06/24/17. She was started on flecainide  but had recurent disease and was started on Tikosyn .  Her mitral valve was replaced 11/2018  (31 mm [facility] bioprosthetic).  She had a MAZE at that time. She later was Dr. Norton and was in a junctional rhythm so diltiazem  was discontinued.  She had recurrent episode of atrial fibrillation and took a dose of diltiazem  she subsequently converted back to sinus rhythm.   Mary Farrell blood pressure was elevated so she switched from losartan  to valsartan .  She also had chest pain that was thought to be atypical, especially in setting of normal coronaries prior to her surgery.  It was thought that her symptoms may be related to GERD and her PPI was increased.  Hydralazine  was added for blood pressures over 140.  She saw Dr. Norton 04/2020 and carvedilol  was reduced due to bradycardia and fatigue.  Later that month she followed up with Callie Goodrich, PA for episodes of persistent tachycardia in the 140s.  She reviewed a Kardia mobile strip and was found to be in atrial flutter.  Carvedilol  was increased to 9.375 mg twice daily.  She had a cardioversion 09/2021 and ablation 01/2022.  She saw Dr. Norton and was maintaining sinus rhythm.  She is bradycardic and fatigued so diltiazem  was discontinued.  She saw Reche Finder, NP 06/2022.  Losartan  was switched to 160 mg twice daily due to mildly elevated blood pressure.  She saw Dr. Norton 08/2022 and reported a few episodes of atrial flutter.  They discussed switching from dofetilide  to amiodarone  as well as repeat ablation attempt.  When she last saw EP clinic 11/2022 she continued to have fatigue but had not had any recent atrial fibrillation or flutter.  At her  visit 12/2022 she reported elevated BP and episodes of tachycardia.  She also noted a near syncopal episode. Jardiance  was added.  She stopped it due to yeast infection.  She was admitted 03/2023 for SVT thought to be atrial tachycardia.  She switched from carvedilol  to metoprolol .  Diltiazem  was increased.  She underwent atrial tachycardia and AV node ablation 04/2023.      Discussed the use  of AI scribe software for clinical note transcription with the patient, who gave verbal consent to proceed.  History of Present Illness Mary Farrell has been off dofetilide  since July 14th and reports feeling better without it, experiencing fewer episodes of atrial tachycardia and atrial fibrillation. She was hospitalized in March for atrial tachycardia and underwent her fourth ablation. She is concerned about the number of ablations she has undergone.  She experiences hip joint pain, which worsens with prolonged walking, such as during grocery shopping. She had a hip injection last year while on Tixin, which caused rapid heartbeats. She is hesitant about further injections due to potential side effects on her heart rhythm.  Her husband has Alzheimer's disease, and she manages his care with limited support from her sons. He experiences mood swings, memory issues, and episodes of confusion, such as getting lost and not recognizing her. His blood sugar has been high, reaching almost 600, but has improved with Mounjaro.  She has concerns about taking a medication prescribed by a urologist due to potential side effects like fast or irregular heartbeat. She has not taken the COVID vaccine yet, despite having received previous doses. Her recent lab work shows an A1c of 5.3 and cholesterol levels that are well-controlled with atorvastatin taken twice a week.  She notes that her blood pressure has been stable.   ROS:  As per HPI  Studies Reviewed: .       Echo 04/25/23:    1. Left ventricular ejection fraction, by estimation, is 60 to 65%. The  left ventricle has normal function. The left ventricle has no regional  wall motion abnormalities. Left ventricular diastolic function could not  be evaluated.   2. Right ventricular systolic function is normal. The right ventricular  size is normal. There is normal pulmonary artery systolic pressure. The  estimated right ventricular systolic pressure is 29.2  mmHg.   3. Left atrial size was severely dilated.   4. Right atrial size was moderately dilated.   5. The mitral valve has been repaired/replaced. No evidence of mitral  valve regurgitation. No evidence of mitral stenosis. The mean mitral valve  gradient is 5.0 mmHg with average heart rate of 92 bpm. There is a 31 mm  Edwards bioprosthetic valve present   in the mitral position. Echo findings are consistent with normal  structure and function of the mitral valve prosthesis.   6. Tricuspid valve regurgitation is mild to moderate.   7. The aortic valve is tricuspid. Aortic valve regurgitation is mild. No  aortic stenosis is present.   8. The inferior vena cava is dilated in size with >50% respiratory  variability, suggesting right atrial pressure of 8 mmHg.   Risk Assessment/Calculations:    CHA2DS2-VASc Score = 5   This indicates a 7.2% annual risk of stroke. The patient's score is based upon: CHF History: 1 HTN History: 1 Diabetes History: 0 Stroke History: 0 Vascular Disease History: 0 Age Score: 2 Gender Score: 1            Physical Exam:   VS:  BP 126/60 (  BP Location: Left Arm, Patient Position: Sitting, Cuff Size: Large)   Pulse 66   Ht 4' 11 (1.499 m)   SpO2 95%   BMI 32.92 kg/m  , BMI Body mass index is 32.92 kg/m. GENERAL:  Well appearing HEENT: Pupils equal round and reactive, fundi not visualized, oral mucosa unremarkable NECK:  No jugular venous distention, waveform within normal limits, carotid upstroke brisk and symmetric, no bruits, no thyromegaly LUNGS:  Clear to auscultation bilaterally HEART:  RRR.  PMI not displaced or sustained,S1 and S2 within normal limits, no S3, no S4, no clicks, no rubs, no murmurs ABD:  Flat, positive bowel sounds normal in frequency in pitch, no bruits, no rebound, no guarding, no midline pulsatile mass, no hepatomegaly, no splenomegaly EXT:  2 plus pulses throughout, no edema, no cyanosis no clubbing SKIN:  No rashes no  nodules NEURO:  Cranial nerves II through XII grossly intact, motor grossly intact throughout PSYCH:  Cognitively intact, oriented to person place and time   ASSESSMENT AND PLAN: .    Assessment & Plan # Atrial arrhythmias including atrial fibrillation, atrial flutter, supraventricular tachycardia, and sick sinus syndrome Atrial arrhythmias improved after ablation and dofetilide  discontinuation. Pharmacists confirmed lower dosing reduces palpitations and elevated blood pressure risk. Estrogen therapy unlikely related to arrhythmias. - Continue diltiazem , metoprolol  and Eliquis .  - Consider hip injection if necessary, aware of potential short-term arrhythmia exacerbation.  # s/p mitral valve replacement:  Status post 31mm Edwards bioprosthetic valve.  Stable on echo 03/2023.  Reminded of need for antibiotics prior to dental procedures.    # Primary hypertension Blood pressure well-controlled with current medications. Recent readings improved. - Continue current antihypertensive regimen.  Diltiazem , hydralazine , metoprolol  and valsartan .   # Hyperlipidemia:   # Right hip joint pain Chronic pain exacerbated by activity. Previous steroid injection provided temporary relief but caused rapid heartbeats. Concerns about potential arrhythmia exacerbation with future injections discussed. - Consider hip injection if necessary, aware of potential short-term arrhythmia exacerbation.  # Caregiver status: We spent a lot of time discussing her home situation and her safety.  Her husband has become aggressive at times when he doesn't remember who she is.  Suggested that she involve her sons to consider options for his care.  Neurology to help with mood stability.  Will ask our social work team to reach out and connect with services if possible.          Dispo: f/u 6 months  Signed, Annabella Scarce, MD

## 2023-12-19 ENCOUNTER — Telehealth: Payer: Self-pay | Admitting: Licensed Clinical Social Worker

## 2023-12-19 NOTE — Progress Notes (Signed)
 Heart and Vascular Care Navigation  12/19/2023  Mary Farrell 1945/02/16 [id number]  Reason for Referral: caregiver resources   Engaged with patient by telephone for initial visit for Heart and Vascular Care Coordination.                                                                                                   Assessment:       LCSW was able to reach pt today at [phone]. Introduced self, role, reason for call. Pt confirmed full name and DOB. Confirmed home address and PCP. Resides with spouse, he has had challenges with memory loss which have worsened over the last several years. She has adult children but they have what she describes as their own challenges and aren't around to help. She has a hard time with getting resources to help her husband and herself as she states he is in denial of his memory loss and writes it off as normal for their age. He has a new PCP and providers that are aware of their challenges and I encouraged her to stay in touch with them should she need additional support/his safety become a concern. Pt shares her husband can be extraordinarily caring but also can be extremely volatile since he started having signs of memory loss. She has called the police before, she has had a neighbor help her clear the house of weapons and they are supportive of her- sharing that she is welcome to stay with them anytime or come over as needed. She does not state any issues with housing, food, utilities etc but is concerned if she needs to find alternate accomodations or supportive housing for her husband that it would be too expensive for them.   LCSW first ensured that pt understands if she is concerned about safety 911 must be her first call, not her children or neighbors. I shared a bit about [facility] and that they have an elder clinic that would likely be a good resource for her to just talk to about what other legal resources she may have. We discussed for her  mental health resources/support groups and I encouraged her to use these as she needs even if her spouse doesn't think he needs them. We will also connect her with some housing options should he need next level of care and some information about transportation in community.   No additional questions, pt okay with resources being sent via MyChart so she can review them at her leisure. Only additional concern was related to MyChart sent to [facility] on 10/7. Looks like Renee had sent recommendations but they weren't then passed on to pt. Requested Shana, CMA, send those forward if possible.                                   HRT/VAS Care Coordination     Patients Home Cardiology Office [facility]   Outpatient Care Team Social Worker   Social Worker Name: Marit Lark, [place], [phone]   Living arrangements for  the past 2 months Single Family Home   Lives with: Spouse; Pets   Patient Current Insurance Coverage Managed Medicare   Patient Has Concern With Paying Medical Bills No   Does Patient Have Prescription Coverage? Yes   Home Assistive Devices/Equipment None; Walker (specify type)   DME Agency NA   HH Agency NA   Current home services DME  rolling walker       Social History:                                                                             SDOH Screenings   Food Insecurity: No Food Insecurity (12/19/2023)  Housing: Low Risk  (12/19/2023)  Transportation Needs: No Transportation Needs (12/19/2023)  Utilities: Not At Risk (12/19/2023)  Depression (PHQ2-9): Low Risk  (03/22/2019)  Financial Resource Strain: Low Risk  (12/19/2023)  Social Connections: Moderately Isolated (04/22/2023)  Stress: Stress Concern Present (12/19/2023)  Tobacco Use: Low Risk  (12/16/2023)  Health Literacy: Adequate Health Literacy (12/19/2023)    SDOH Interventions: Financial Resources:  Financial Strain Interventions: Other (Comment) (only mentioned concerns with costs of care should pt  spouse need memory care)  Food Insecurity:  Food Insecurity Interventions: Intervention Not Indicated  Housing Insecurity:  Housing Interventions: Intervention Not Indicated  Transportation:   Transportation Interventions: Intervention Not Indicated     Other Care Navigation Interventions:     Provided Pharmacy assistance resources  Pt had question about medication- sent via mychart- will f/u with Shana, CMA, to get Renee's answer sent to pt  Patient expressed Mental Health concerns Yes, Referred to:  support groups for caregivers, mental health counseling, family justice center   Follow-up plan:   See myChart message with f/u resources. Message sent to team about medication question. Encouraged pt to f/u with me, and will f/u as well to see if any additional resources needed.

## 2024-02-13 ENCOUNTER — Emergency Department (HOSPITAL_COMMUNITY)

## 2024-02-13 ENCOUNTER — Encounter (HOSPITAL_COMMUNITY): Payer: Self-pay

## 2024-02-13 ENCOUNTER — Other Ambulatory Visit: Payer: Self-pay

## 2024-02-13 ENCOUNTER — Emergency Department (HOSPITAL_COMMUNITY)
Admission: EM | Admit: 2024-02-13 | Discharge: 2024-02-13 | Disposition: A | Discharge status: Home / Self Care | Attending: Emergency Medicine | Admitting: Emergency Medicine

## 2024-02-13 DIAGNOSIS — Z79899 Other long term (current) drug therapy: Secondary | ICD-10-CM | POA: Insufficient documentation

## 2024-02-13 DIAGNOSIS — R42 Dizziness and giddiness: Secondary | ICD-10-CM | POA: Diagnosis present

## 2024-02-13 DIAGNOSIS — I110 Hypertensive heart disease with heart failure: Secondary | ICD-10-CM | POA: Insufficient documentation

## 2024-02-13 DIAGNOSIS — I509 Heart failure, unspecified: Secondary | ICD-10-CM | POA: Diagnosis not present

## 2024-02-13 DIAGNOSIS — Z7901 Long term (current) use of anticoagulants: Secondary | ICD-10-CM | POA: Insufficient documentation

## 2024-02-13 LAB — BASIC METABOLIC PANEL WITH GFR
Anion gap: 13 (ref 5–15)
BUN: 12 mg/dL (ref 8–23)
CO2: 23 mmol/L (ref 22–32)
Calcium: 9.3 mg/dL (ref 8.9–10.3)
Chloride: 105 mmol/L (ref 98–111)
Creatinine, Ser: 0.84 mg/dL (ref 0.44–1.00)
GFR, Estimated: >60 mL/min
Glucose, Bld: 107 mg/dL — ABNORMAL HIGH (ref 70–99)
Potassium: 4.4 mmol/L (ref 3.5–5.1)
Sodium: 140 mmol/L (ref 135–145)

## 2024-02-13 LAB — CBC
HCT: 43.5 % (ref 36.0–46.0)
Hemoglobin: 13.5 g/dL (ref 12.0–15.0)
MCH: 29.5 pg (ref 26.0–34.0)
MCHC: 31 g/dL (ref 30.0–36.0)
MCV: 95.2 fL (ref 80.0–100.0)
Platelets: 219 K/uL (ref 150–400)
RBC: 4.57 MIL/uL (ref 3.87–5.11)
RDW: 15.8 % — ABNORMAL HIGH (ref 11.5–15.5)
WBC: 6.9 K/uL (ref 4.0–10.5)
nRBC: 0 % (ref 0.0–0.2)

## 2024-02-13 LAB — MAGNESIUM: Magnesium: 2.7 mg/dL — ABNORMAL HIGH (ref 1.7–2.4)

## 2024-02-13 LAB — TROPONIN T, HIGH SENSITIVITY: Troponin T High Sensitivity: <15 ng/L (ref 0–19)

## 2024-02-13 MED ORDER — MECLIZINE HCL 12.5 MG PO TABS: 12.5000 mg | ORAL_TABLET | Freq: Three times a day (TID) | ORAL | 0 refills | Status: AC | PRN

## 2024-02-13 NOTE — ED Provider Notes (Signed)
 " [place] EMERGENCY DEPARTMENT AT [facility] Provider Note   CSN: [id number] Arrival date & time: 02/13/24  1151     Patient presents with: Dizziness   Mary Farrell is a 79 y.o. female.    Dizziness Patient presents with dizziness.  Lightheadedness.  Began to feel worse this morning.  States she was having little difficulty speaking with it.  Did not feel her heart going fast or slow but does have a history of arrhythmias.  Reportedly also has had peripheral vertigo in the past.  No headache.  No confusion.  Still feels a little dizzy when she turns her head.    Past Medical History:  Diagnosis Date   Anxiety 06/29/2012   Aortic regurgitation 08/13/2015   Arthritis    hands, wrists, back, feet, toes (06/24/2017)   Atrial flutter (HCC) 09/18/2015   Atypical chest pain 05/13/2016   Caregiver stress 12/16/2023   Chest pain    remote cath in 1996 with NORMAL coronaries noted   CHF (congestive heart failure) (HCC)    Dyspnea 10/30/2010   Essential hypertension 09/05/2013   Exogenous obesity    history of    GERD (gastroesophageal reflux disease)    Glaucoma, both eyes    Headache, migraine    stopped in my 50's (09/18/2015)   History of cardiovascular stress test 2007   showing no ischemia   Hypertension    Mitral stenosis and aortic insufficiency 06/23/2010   Mitral stenosis with insufficiency    Mitral stenosis with regurgitation    Murmur    of mitral stenosis and moderate aortic insufficiency       Nausea 05/01/2018   Obesity (BMI 30.0-34.9) 02/13/2021   OSA on CPAP    Palpitations    occasional   Paroxysmal A-fib (HCC) 06/24/2017   Pericardial effusion 09/18/2015   Persistent atrial fibrillation (HCC) 03/10/2017   Personal history of rheumatic heart disease    S/P minimally-invasive maze operation for atrial fibrillation 12/13/2018   Complete bilateral atrial lesion set using cryothermy and bipolar radiofrequency ablation with clipping of LA  appendage via right mini-thoracotomy approach   S/P minimally-invasive mitral valve replacement with bioprosthetic valve 12/13/2018   31 mm [facility] Mitral stented bovine pericardial tissue valve   SBE (subacute bacterial endocarditis)    prophylaxis, patient unaware   Sliding hiatal hernia    Stress 02/13/2021   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 Authorizing Provider  meclizine  (ANTIVERT ) 12.5 MG tablet Take 1 tablet (12.5 mg total) by mouth 3 (three) times daily as needed for dizziness. 02/13/24  Yes Patsey Lot, MD  acetaminophen  (TYLENOL ) 500 MG tablet Take 500 mg by mouth daily as needed for moderate pain.    [provider]  ALPRAZolam  (XANAX ) 0.25 MG tablet Take 0.125-0.25 mg by mouth See admin instructions. Take 0.25 mg at night, may take 0.25 mg dose as needed for anxiety during the day    [provider]  apixaban  (ELIQUIS ) 5 MG TABS tablet TAKE 1 TABLET BY MOUTH TWICE  DAILY 09/14/23   Raford Riggs, MD  atorvastatin (LIPITOR) 10 MG tablet Take 10 mg by mouth 2 (two) times a week. 10/01/21   [provider]  Calcium  Citrate-Vitamin D  (CALCIUM  + D PO) Take 1 tablet by mouth daily.    [provider]  cetirizine (ZYRTEC) 10 MG tablet Take 10 mg by mouth at bedtime.  [provider]  Cholecalciferol  (VITAMIN D ) 50 MCG (2000 UT) tablet Take 2,000 Units by mouth daily. Patient taking differently: Take 2,000 Units by mouth 2 (two) times a week.    [provider]  cyanocobalamin  (VITAMIN B12) 1000 MCG tablet Take 1,000 mcg by mouth daily. Patient taking differently: Take 1,000 mcg by mouth 2 (two) times a week.    [provider]  diltiazem  (CARDIZEM  CD) 120 MG 24 hr capsule Take 1 capsule (120 mg total) by mouth at bedtime. 08/22/23   Vannie Reche RAMAN, NP  diltiazem  (CARDIZEM ) 30 MG tablet Take 1  tablet (30 mg total) by mouth 4 (four) times daily as needed (for heart rates greater than 100). 10/06/21   Camnitz, Soyla Lunger, MD  dorzolamide -timolol  (COSOPT ) 22.3-6.8 MG/ML ophthalmic solution Place 1 drop into both eyes 2 (two) times daily.    [provider]  fluticasone (FLONASE) 50 MCG/ACT nasal spray Place 1 spray into both nostrils daily.    [provider]  furosemide  (LASIX ) 20 MG tablet Take 1 tablet (20 mg total) by mouth daily as needed for fluid. For >3lb/overnight or >5lb/weekly note dose 03/22/23   Ursuy, Renee Lynn, PA-C  gabapentin  (NEURONTIN ) 300 MG capsule Take 300 mg by mouth 2 (two) times daily.    [provider]  hydrALAZINE  (APRESOLINE ) 25 MG tablet TAKE 1 TABLET (25 MG) BY MOUTH IN THE MORNING AND AT BEDTIME 07/11/23   Walker, Caitlin S, NP  latanoprost  (XALATAN ) 0.005 % ophthalmic solution Place 1 drop into both eyes at bedtime.  06/17/14   [provider]  Lidocaine -Glycerin  (PREPARATION H EX) Apply 1 Application topically daily as needed (hemorrhoids).    [provider]  magnesium  oxide (MAG-OX) 400 (240 Mg) MG tablet Take 1 tablet (400 mg total) by mouth every other day. 04/16/22   Fenton, Clint R, PA  metoprolol  succinate (TOPROL -XL) 50 MG 24 hr tablet Take 1 tablet (50 mg total) by mouth daily. Take with or immediately following a meal. May take an additional half tablet as needed for breakthrough palpitations. 08/22/23 12/16/23  Vannie Reche RAMAN, NP  Multiple Vitamin (MULTIVITAMIN WITH MINERALS) TABS tablet Take 1 tablet by mouth daily.    [provider]  NON FORMULARY Apply 1 application topically 2 (two) times daily as needed (for eczema). Traimcinolone/[employer] Moist Cream    [provider]  oxybutynin  (DITROPAN -XL) 10 MG 24 hr tablet Take 10 mg by mouth daily. Patient taking differently: Take 10 mg by mouth daily as needed (symptoms). 12/16/15   [provider]  pantoprazole  (PROTONIX ) 40 MG tablet  Take 40 mg by mouth daily. Patient taking differently: Take 40 mg by mouth 2 (two) times daily.    [provider]  polyethylene glycol (MIRALAX  / GLYCOLAX ) 17 g packet Take 17 g by mouth every other day.    [provider]  potassium chloride  (KLOR-CON ) 10 MEQ tablet Take 1 tablet (10 mEq total) by mouth daily as needed (when you take Lasix ). 03/22/23   Leverne Charlies Helling, PA-C  PRESCRIPTION MEDICATION Inhale into the lungs at bedtime. CPAP    [provider]  Probiotic Product (PROBIOTIC PO) Take 1 capsule by mouth every evening.    [provider]  sodium chloride  (OCEAN) 0.65 % SOLN nasal spray Place 1 spray into both nostrils at bedtime.    [provider]  valsartan  (DIOVAN ) 160 MG tablet TAKE 1 TABLET BY MOUTH TWICE  DAILY 11/21/23   Walker, Caitlin S, NP  YUVAFEM  10 MCG TABS vaginal tablet Place 10 mcg vaginally daily.    [provider]    Allergies: Jardiance  [empagliflozin ], Mirabegron, Promethazine , Tizanidine, Amoxicillin, Metoprolol  tartrate, and Oxaprozin    Review of Systems  Neurological:  Positive for dizziness.    Updated Vital Signs BP 133/75   Pulse 79   Temp 97.8 F (36.6 C) (Oral)   Resp 18   Ht 4' 11 (1.499 m)   Wt 72.6 kg   SpO2 95%   BMI 32.32 kg/m   Physical Exam Vitals and nursing note reviewed.  Cardiovascular:     Rate and Rhythm: Normal rate and regular rhythm.  Pulmonary:     Effort: No respiratory distress.     Breath sounds: No rhonchi.  Abdominal:     Tenderness: There is no abdominal tenderness.  Skin:    General: Skin is warm.  Neurological:     Mental Status: She is alert and oriented to person, place, and time.     Comments: Finger-nose intact bilaterally.  Does have nystagmus with gaze to the left.  Eye movement intact.  Face symmetric.     (all labs ordered are listed, but only abnormal results are displayed) Labs Reviewed  CBC - Abnormal; Notable for the following components:       Result Value   RDW 15.8 (*)    All other components within normal limits  BASIC METABOLIC PANEL WITH GFR - Abnormal; Notable for the following components:   Glucose, Bld 107 (*)    All other components within normal limits  MAGNESIUM  - Abnormal; Notable for the following components:   Magnesium  2.7 (*)    All other components within normal limits  TROPONIN T, HIGH SENSITIVITY    EKG: EKG Interpretation Date/Time:  Monday February 13 2024 12:11:42 EST Ventricular Rate:  71 PR Interval:    QRS Duration:  100 QT Interval:  411 QTC Calculation: 447 R Axis:   100  Text Interpretation: Regular rhythm however difficult to determine  actual rhythm due to  baseline wander and artifact. Right axis deviation Abnormal R-wave progression, late transition Consider left ventricular hypertrophy Confirmed by Patsey Lot [phone]) on 02/13/2024 12:21:27 PM  Radiology: DG Chest Portable 1 View Result Date: 02/13/2024 CLINICAL DATA:  Nausea and dizziness. EXAM: PORTABLE CHEST 1 VIEW COMPARISON:  April 21, 2023 FINDINGS: The cardiac silhouette is borderline in size and stable in appearance. An artificial cardiac valve is seen. Very mild atelectasis and/or infiltrate is seen within the mid left lung. No pleural effusion or pneumothorax is identified. The visualized skeletal structures are unremarkable. IMPRESSION: Very mild mid left lung atelectasis and/or infiltrate. Electronically Signed   By: Suzen Dials M.D.   On: 02/13/2024 13:51     Procedures   Medications Ordered in the ED - No data to display                                  Medical Decision Making Amount and/or Complexity of Data Reviewed Labs: ordered. Radiology: ordered.   Patient with vertiginous episode.  History of arrhythmia.  EKG reassuring although difficult to get rhythm on it it appears to be regular.  On the monitor by my interpretation appears to be in a sinus rhythm.  However with dizziness and some  difficulty speaking does raise the risk for central cause of vertigo.  Will get MRI to evaluate.  Not a TNK candidate due to  mild symptoms.  Blood work reassuring.  MRI pending.  Patient feeling much better after treatment.  Blood work reassuring.  Does not want to wait for MRI.  I think this is reasonable.  Has had peripheral vertigo in the past.  Will discharge home.        Final diagnoses:  Vertigo    ED Discharge Orders          Ordered    meclizine  (ANTIVERT ) 12.5 MG tablet  3 times daily PRN        02/13/24 1508               Patsey Lot, MD 02/13/24 1527  "

## 2024-02-13 NOTE — ED Triage Notes (Signed)
 Pt stated she started feeling dizzy and nauseous and hot around 10AM. Pt denies chest pain or SHOB.

## 2024-02-25 ENCOUNTER — Other Ambulatory Visit: Payer: Self-pay | Admitting: Cardiovascular Disease

## 2024-02-25 ENCOUNTER — Other Ambulatory Visit (HOSPITAL_BASED_OUTPATIENT_CLINIC_OR_DEPARTMENT_OTHER): Payer: Self-pay | Admitting: Family

## 2024-02-25 DIAGNOSIS — I10 Essential (primary) hypertension: Secondary | ICD-10-CM

## 2024-02-25 DIAGNOSIS — I4819 Other persistent atrial fibrillation: Secondary | ICD-10-CM

## 2024-02-27 NOTE — Telephone Encounter (Signed)
 Eliquis  5mg  refill request received. Patient is 79 years old, weight-72.6kg, Crea-0.84 on 02/13/24, Diagnosis-Aflutter/Afib, and last seen by Dr. Raford on 12/16/23. Dose is appropriate based on dosing criteria. Will send in refill to requested pharmacy.

## 2024-02-28 ENCOUNTER — Other Ambulatory Visit: Payer: Self-pay | Admitting: Family

## 2024-02-28 MED ORDER — HYDRALAZINE HCL 25 MG PO TABS: 25.0000 mg | ORAL_TABLET | Freq: Two times a day (BID) | ORAL | 3 refills | Status: AC
